# Patient Record
Sex: Female | Born: 1945 | Race: Black or African American | Hispanic: No | Smoking: Former smoker
Health system: Southern US, Community
[De-identification: ages and names within clinical notes are randomized; demographics above are authoritative.]

## PROBLEM LIST (undated history)

## (undated) DIAGNOSIS — E785 Hyperlipidemia, unspecified: Secondary | ICD-10-CM

## (undated) DIAGNOSIS — M722 Plantar fascial fibromatosis: Secondary | ICD-10-CM

## (undated) DIAGNOSIS — M1710 Unilateral primary osteoarthritis, unspecified knee: Secondary | ICD-10-CM

## (undated) DIAGNOSIS — E8881 Metabolic syndrome: Secondary | ICD-10-CM

## (undated) DIAGNOSIS — H9190 Unspecified hearing loss, unspecified ear: Secondary | ICD-10-CM

## (undated) DIAGNOSIS — N189 Chronic kidney disease, unspecified: Secondary | ICD-10-CM

## (undated) DIAGNOSIS — I10 Essential (primary) hypertension: Secondary | ICD-10-CM

## (undated) DIAGNOSIS — N289 Disorder of kidney and ureter, unspecified: Secondary | ICD-10-CM

## (undated) HISTORY — DX: Unilateral primary osteoarthritis, unspecified knee: M17.10

## (undated) HISTORY — PX: ABDOMINAL AORTIC ANEURYSM REPAIR: SUR1152

## (undated) HISTORY — PX: THYROIDECTOMY: SHX17

## (undated) HISTORY — DX: Unspecified hearing loss, unspecified ear: H91.90

## (undated) HISTORY — PX: ABDOMINAL HYSTERECTOMY: SHX81

## (undated) HISTORY — DX: Chronic kidney disease, unspecified: N18.9

## (undated) HISTORY — DX: Essential (primary) hypertension: I10

## (undated) HISTORY — PX: INNER EAR SURGERY: SHX679

## (undated) HISTORY — DX: Metabolic syndrome: E88.81

## (undated) HISTORY — DX: Plantar fascial fibromatosis: M72.2

## (undated) HISTORY — PX: HERNIA REPAIR: SHX51

## (undated) HISTORY — DX: Metabolic syndrome: E88.810

## (undated) HISTORY — PX: APPENDECTOMY: SHX54

## (undated) HISTORY — DX: Hyperlipidemia, unspecified: E78.5

---

## 1998-07-10 ENCOUNTER — Other Ambulatory Visit: Admission: RE | Admit: 1998-07-10 | Discharge: 1998-07-10 | Payer: Self-pay | Admitting: Otolaryngology

## 2002-01-18 ENCOUNTER — Encounter: Admission: RE | Admit: 2002-01-18 | Discharge: 2002-01-18 | Payer: Self-pay | Admitting: Otolaryngology

## 2002-01-18 ENCOUNTER — Encounter: Payer: Self-pay | Admitting: Otolaryngology

## 2004-08-20 ENCOUNTER — Ambulatory Visit: Payer: Self-pay | Admitting: Vascular Surgery

## 2004-08-20 IMAGING — CT CT CHEST-ABD-PELV W/ CM
1 of 3 series · 12 of 31 positions shown, 18 images · IV contrast (APPLIED)
Comparison: none

REASON FOR EXAM: Abdominal aneurysm: 3 mm cuts around the renal
COMMENTS:

[Series 5: inspace · axial · 0.67mm/px · z∈[-545,-32]mm · 12 of 209 slices shown, 18 images]
[im 19/209  mediastinal]
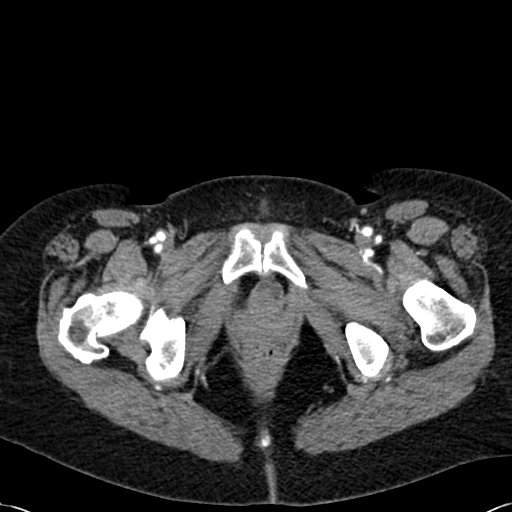
[im 19/209  bone]
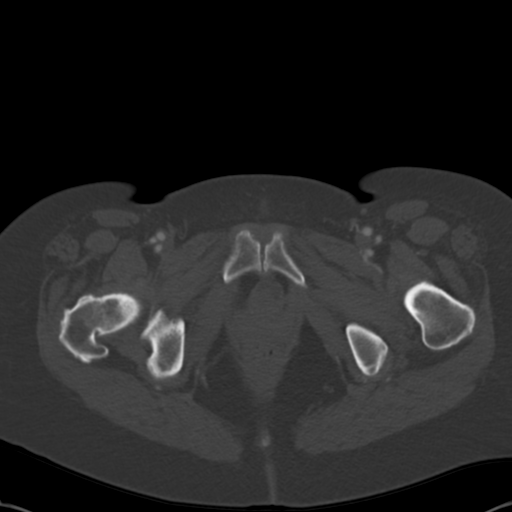
[im 38/209  mediastinal]
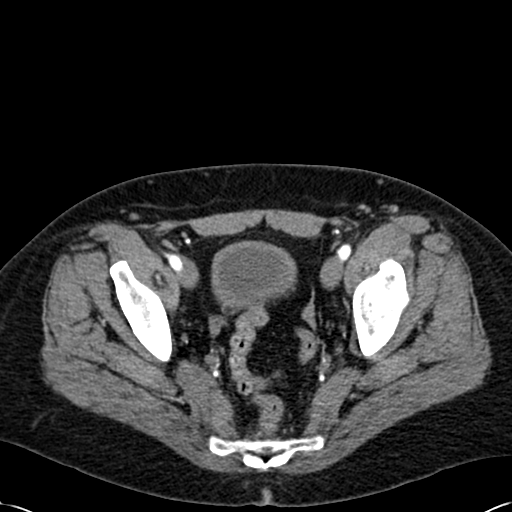
[im 57/209  mediastinal]
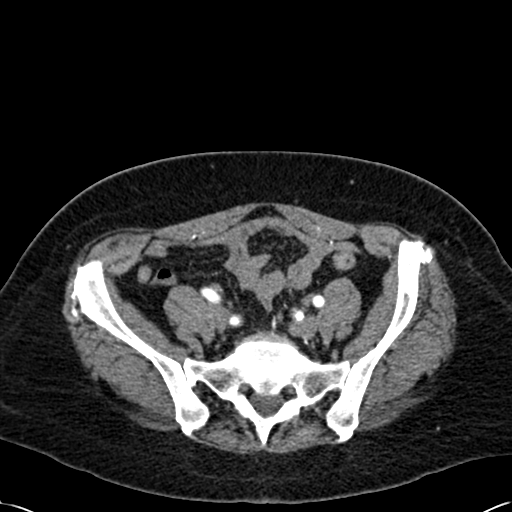
[im 76/209  mediastinal]
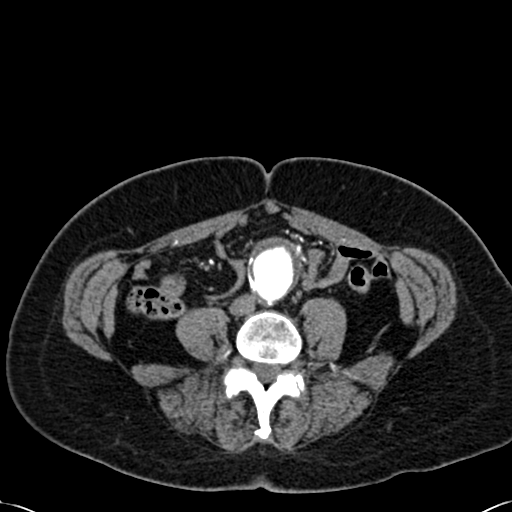
[im 95/209  mediastinal]
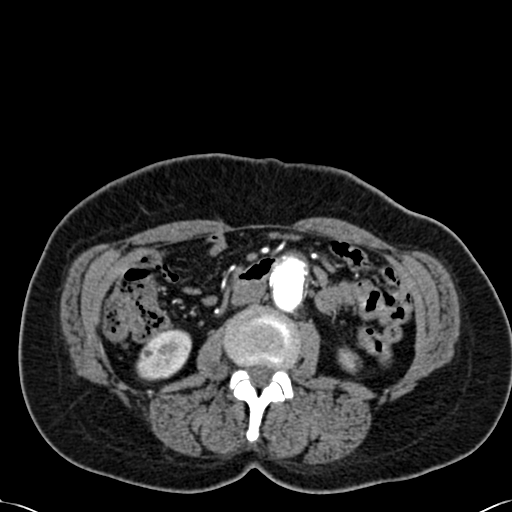
[im 102/209  mediastinal]
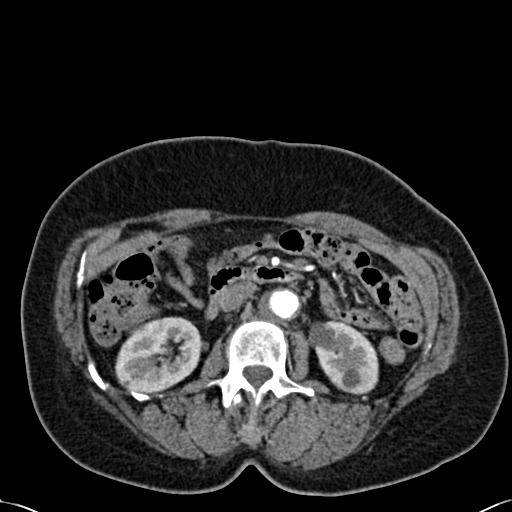
[im 105/209  mediastinal]
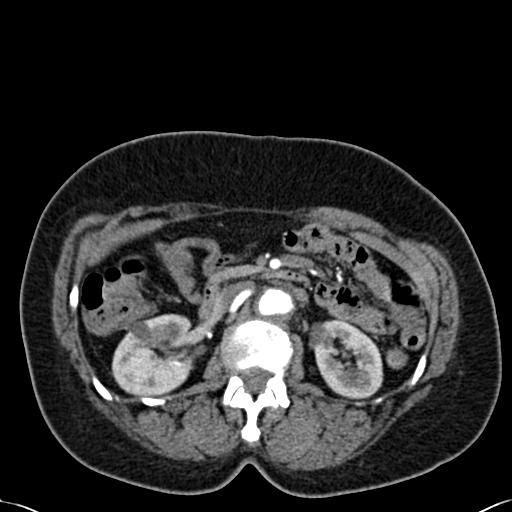
[im 114/209  mediastinal]
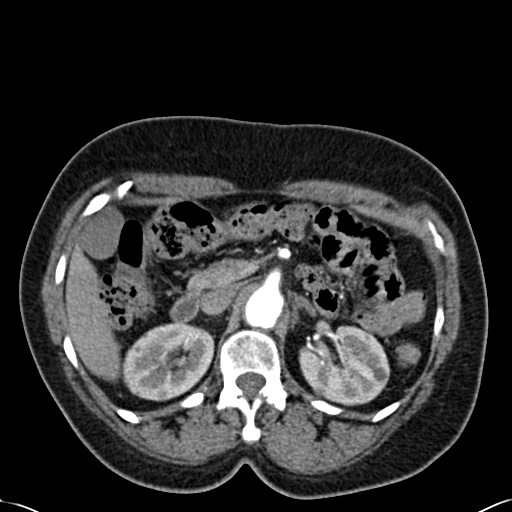
[im 133/209  mediastinal]
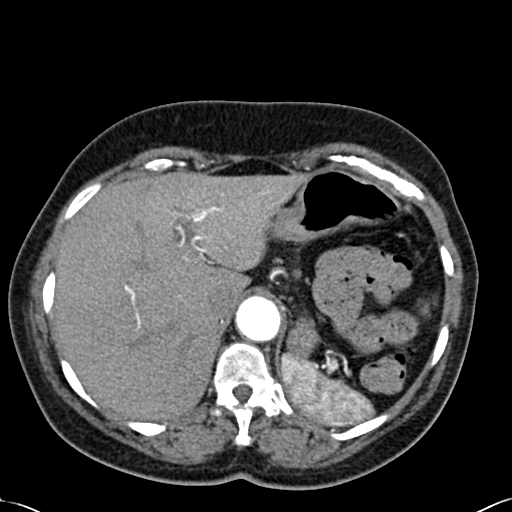
[im 133/209  lung]
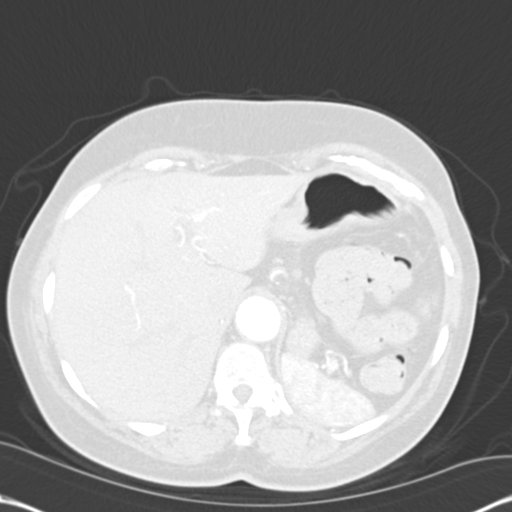
[im 133/209  bone]
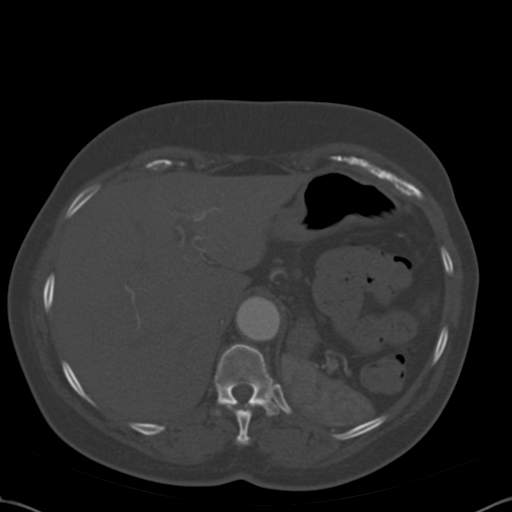
[im 152/209  mediastinal]
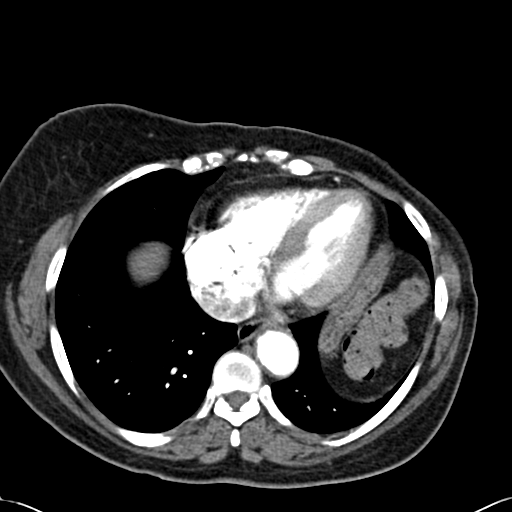
[im 152/209  lung]
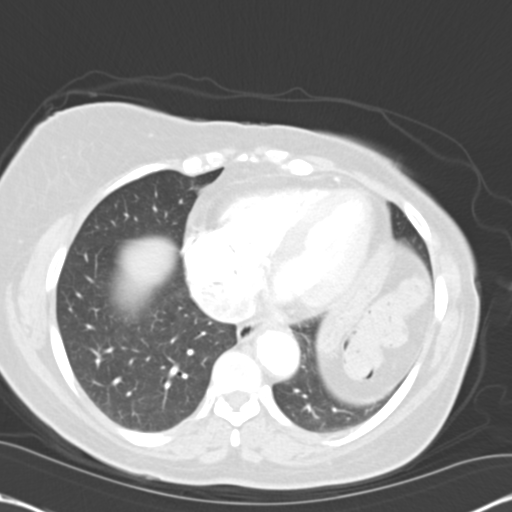
[im 171/209  mediastinal]
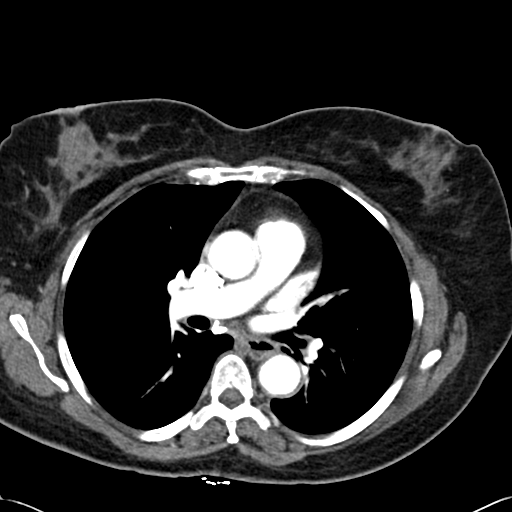
[im 171/209  lung]
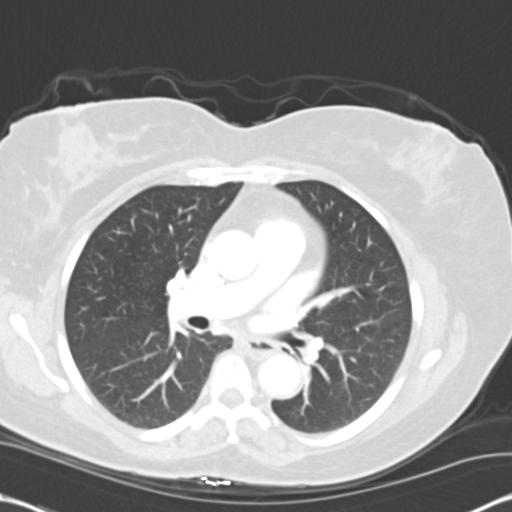
[im 190/209  mediastinal]
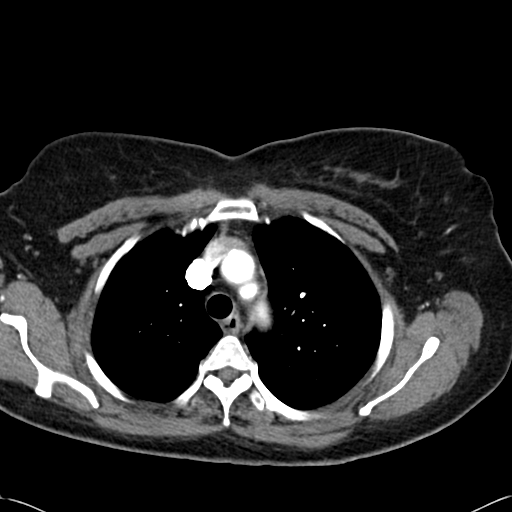
[im 190/209  lung]
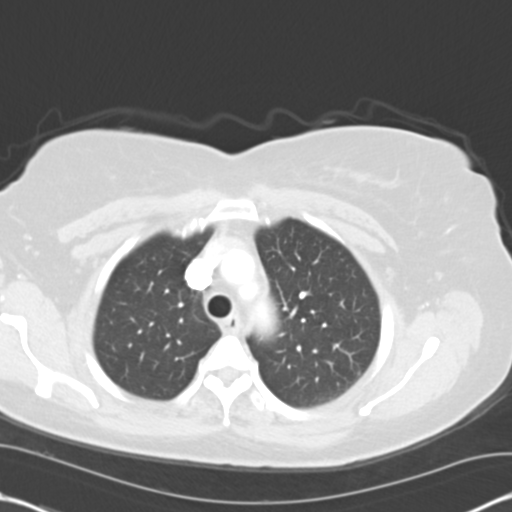

[12 of 31 positions shown; findings below may reference images not displayed]

PROCEDURE:     CT  - CT CHEST ABDOMEN AND PELVIS W  - [DATE]  [DATE]

RESULT:     Spiral 3-mm sections were obtained from the thoracic inlet
through the lung bases status post intravenous administration of 100 cc of
[NL].

CT SCAN OF THE CHEST: There does not appear to be evidence of mediastinal or
hilar masses or adenopathy.  Evaluation of the lung parenchyma demonstrates
an ill-defined area of mild to moderate spiculated linear increased density
within the superior segment LEFT lower lobe.  No further infiltrates,
effusions, or edema is appreciated.

CT SCAN OF THE ABDOMEN AND PELVIS:  Spiral 3-mm sections were obtained from
the lung bases through the pubic symphysis status post intravenous
administration of 100 cc of [NL].

The liver, spleen, adrenals, and pancreas are unremarkable.  Bilateral
low-attenuating foci are once again demonstrated within the RIGHT and LEFT
kidneys unchanged and again likely representing the sequela of cysts.  The
previously described abdominal aortic aneurysm is unchanged.  This was
compared to the previous study dated [DATE]. There does not appear to be
perianeurysmal fluid to suggest sequela of leakage. No evidence of abdominal
or pelvic free fluid, drainable loculated fluid collections, masses, or
adenopathy is appreciated.
IMPRESSION: Atelectasis versus infiltrate versus scarring within the base of the LEFT
lower lobe. There does not appear to be evidence of a thoracic aortic
aneurysm or dissection.

Stable abdominal aortic aneurysm as described above.

## 2004-08-26 IMAGING — CR DG CHEST 2V
1 series · 2 of 2 positions shown · non-contrast
Comparison: none

REASON FOR EXAM: Vomiting, cough
COMMENTS:

PROCEDURE:     DXR - DXR CHEST PA (OR AP) AND LATERAL  - [DATE]  [DATE]
RESULT:     PA and lateral views of the chest show the lungs to be clear and
well expanded without evidence of infiltrates, effusions or mass.

[Series 1112: postero_anterior · 0.11mm/px · 2 of 2 slices shown]
[im 1/2]
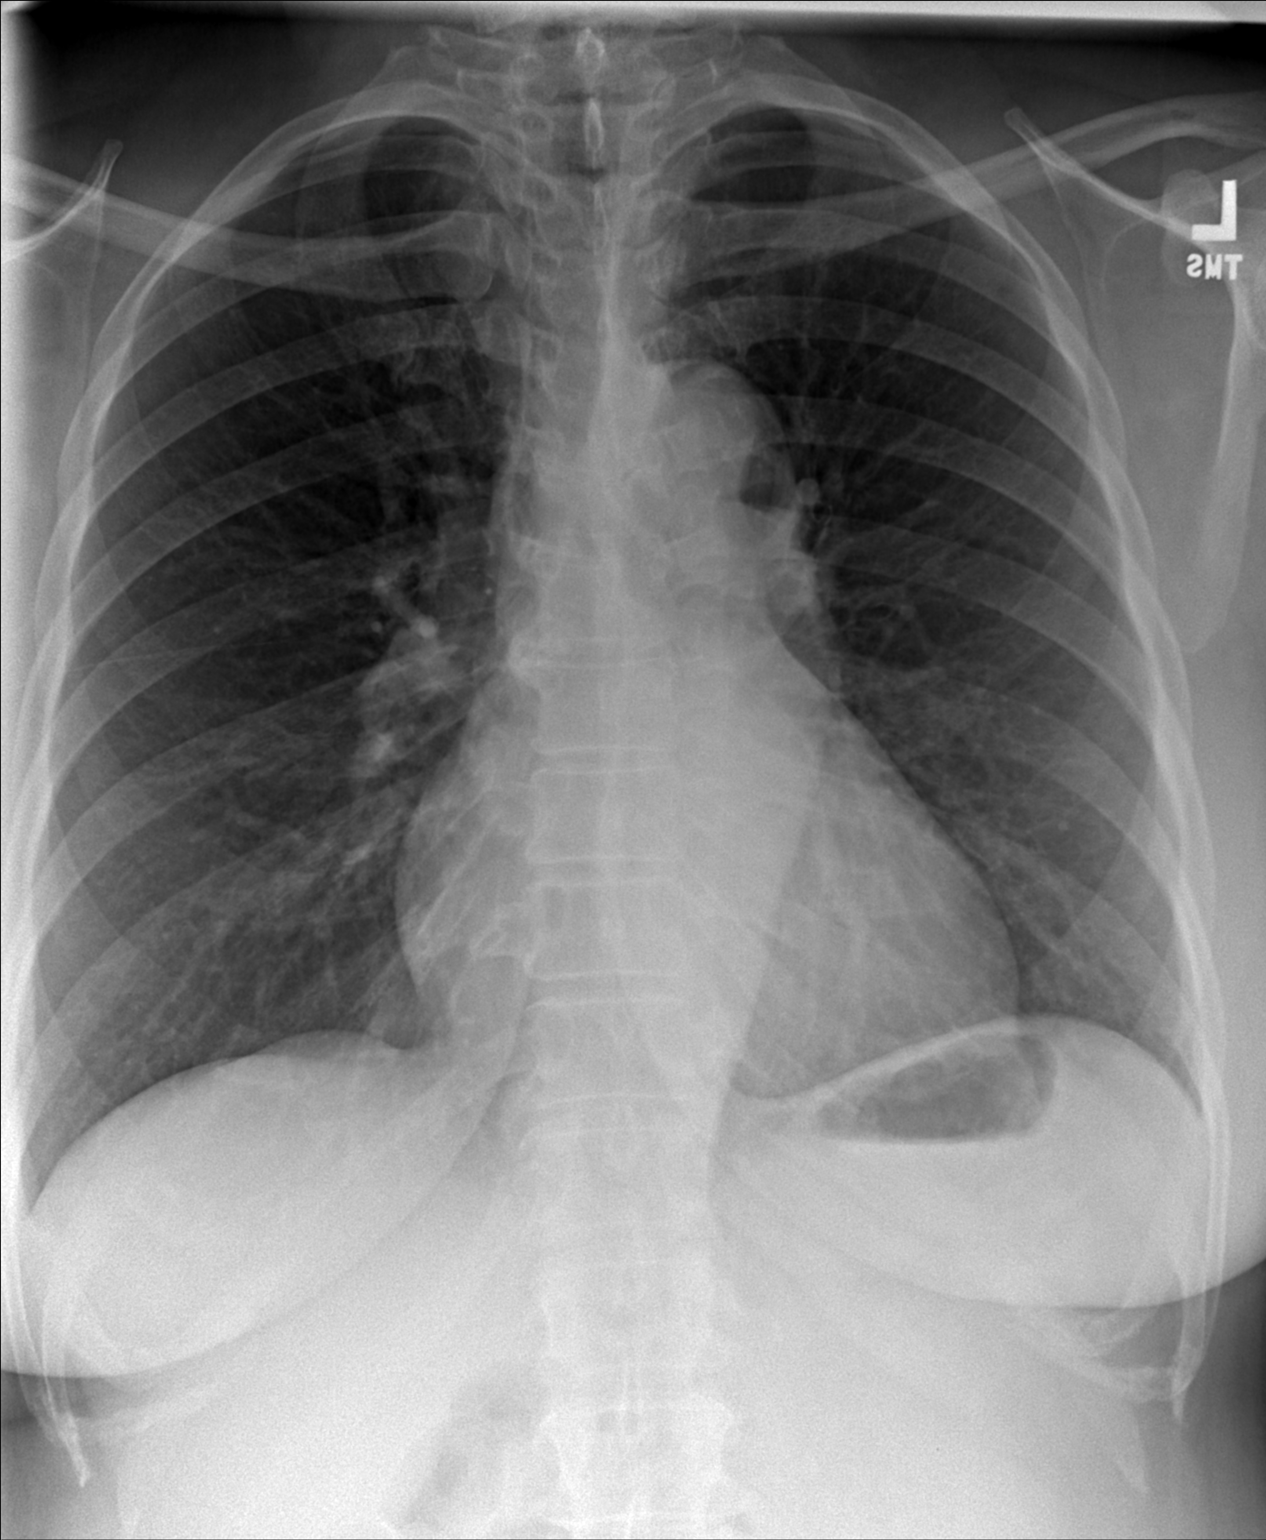
[im 2/2]
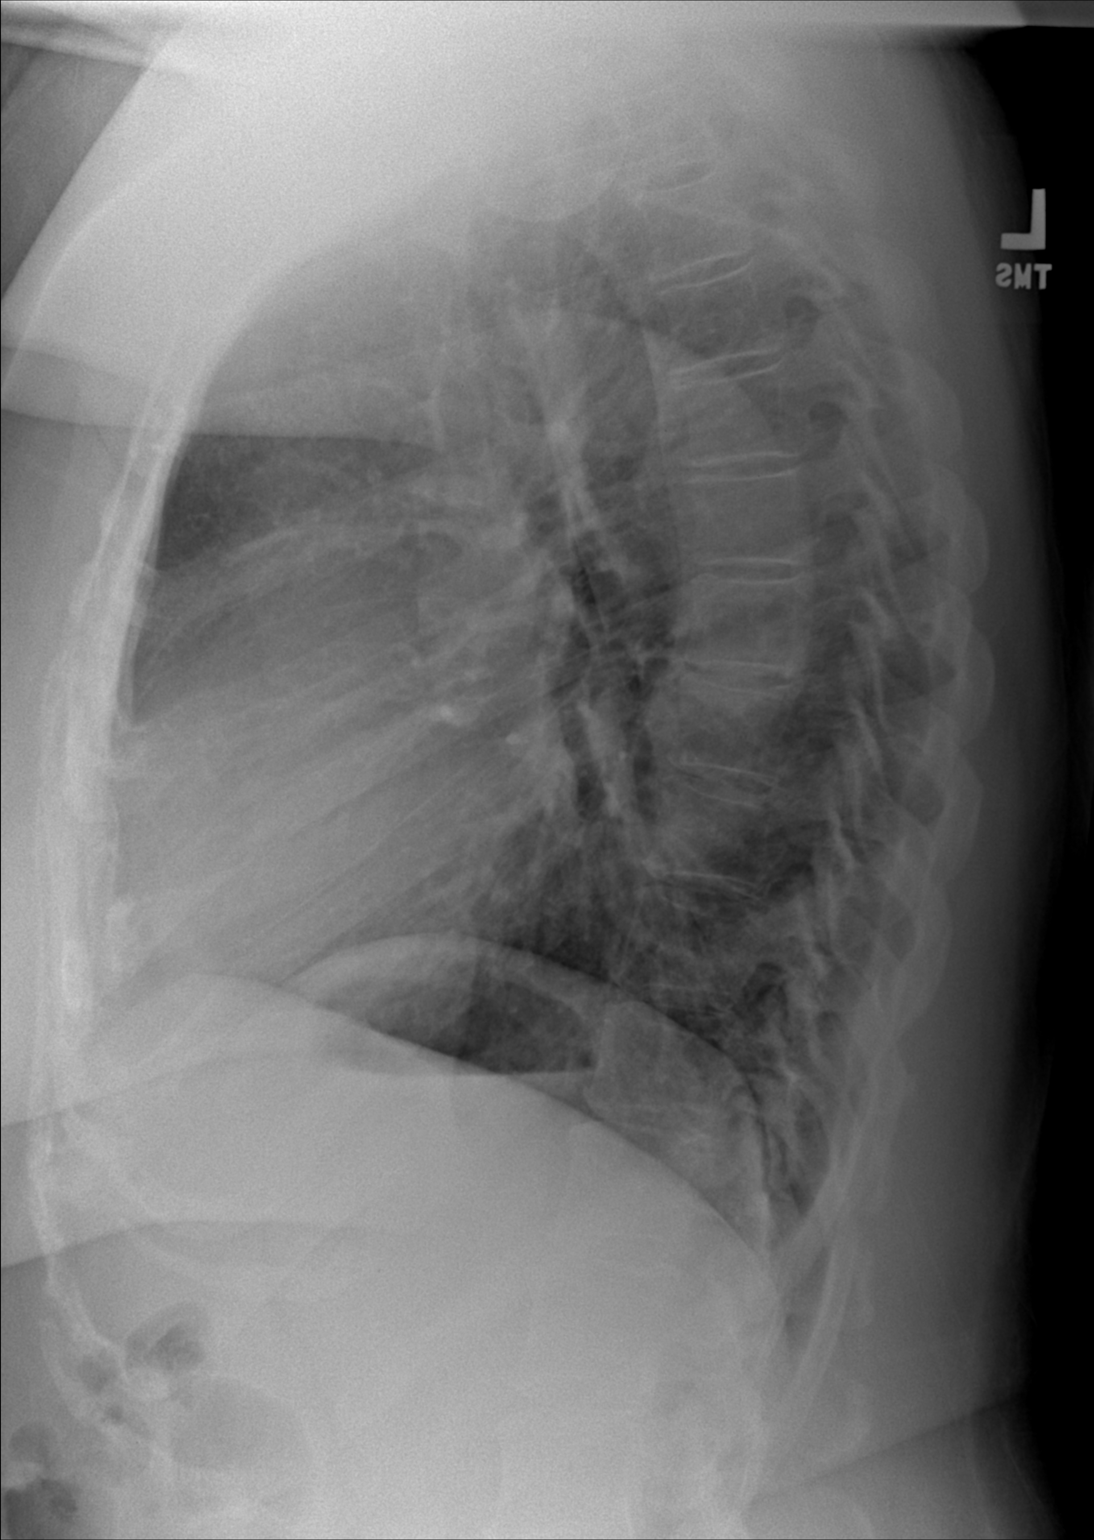

[2 of 2 positions shown; findings below may reference images not displayed]

IMPRESSION: No acute pulmonary disease. No change since [DATE].

## 2005-02-24 ENCOUNTER — Encounter: Admission: RE | Admit: 2005-02-24 | Discharge: 2005-02-24 | Payer: Self-pay | Admitting: Vascular Surgery

## 2005-02-24 IMAGING — CT CT ANGIO ABDOMEN
1 of 8 series · 13 of 34 positions shown, 18 images · IV contrast ([ID] omniopaque)
Comparison: none

CLINICAL DATA: Pre-stent for AAA.
TECHNIQUE: Multidetector CT imaging of the abdomen was performed before and during bolus injection of intravenous contrast.  Multiplanar CT angiographic image reconstructions were generated to evaluate the vascular anatomy.
 Contrast:  150 cc of Omnipaque 300.  
 The lung bases are clear.  The liver enhances normally with no focal abnormality, and no ductal dilatation is seen.  No calcified gallstones are noted.  The pancreas is normal in size with normal peripancreatic fat planes.  The adrenal glands and spleen appear normal.  The kidneys enhance normally and contain multiple cysts of varying sizes.  On delayed images, the pelvocaliceal systems appear normal.  No adenopathy is seen.  Infrarenal AAA is noted and the following pre stent graft measurements were obtained:
 A. 30 mm  Length of infrarenal neck below lowest renal artery to top of aneurysmal aorta
   Number of Renal Arteries  (R) 1 (L) 1
 B. 19 mm  Diameter of infrarenal neck
 C. 107 mm  Total length of Aneurysm
   Does aneurysm end at Aortic Bifurcation?  Yes
   If not, distance from aneurysm to bifurcation_
 D. 42 mm  Greatest aneurysm diameter
 E. 17 mm  (R) Greatest Common Iliac Artery diameters (proximally)
   13 mm  (L)
 F. 15 mm  (R) Diameter of Common Iliac Arteries just above iliac bifurcation
   12 mm  (L)
 G. 20 mm  (R) Approximate length of Common Iliac Arteries
   21 mm  (L)
 H.  Comments:  Both common iliac arteries are somewhat shortened in length measuring 20 and 21 mm prior to the iliac bifurcation.

[Series 5: recon 2: post contrast · axial · 0.70mm/px · z∈[-356,+15]mm · 13 of 335 slices shown, 18 images]
[im 19/335  soft-tissue]
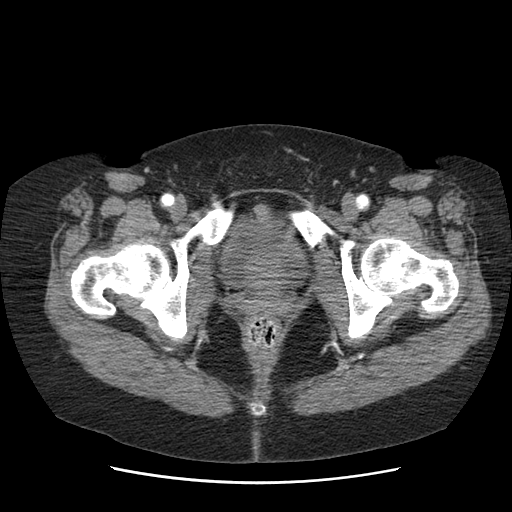
[im 19/335  bone]
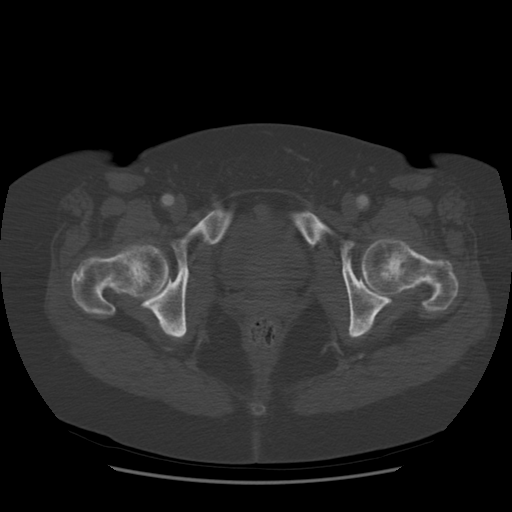
[im 56/335  soft-tissue]
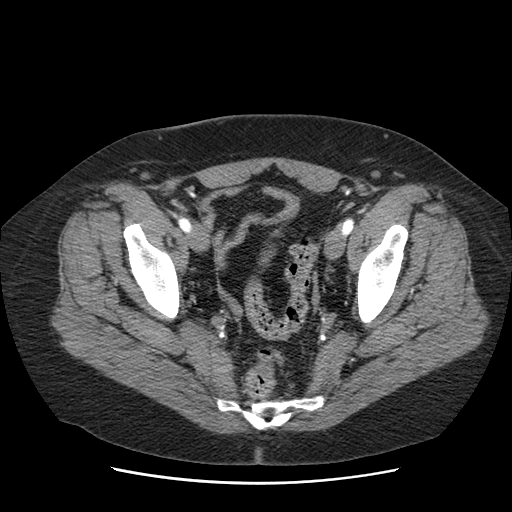
[im 75/335  soft-tissue]
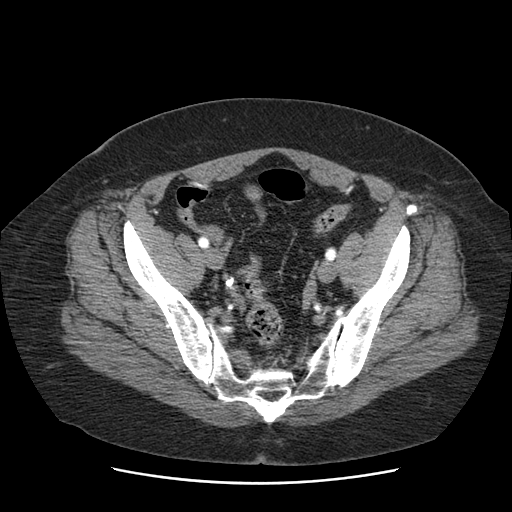
[im 93/335  soft-tissue]
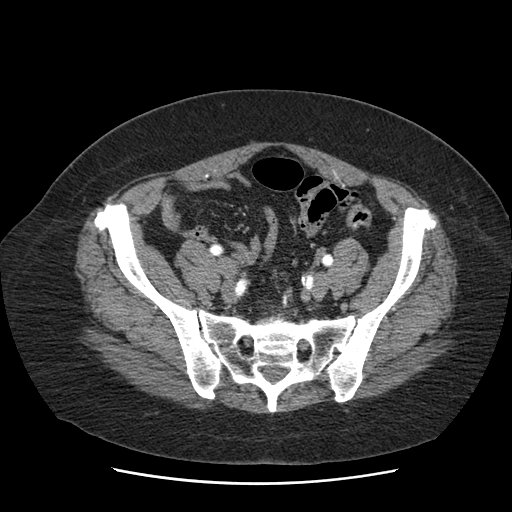
[im 130/335  soft-tissue]
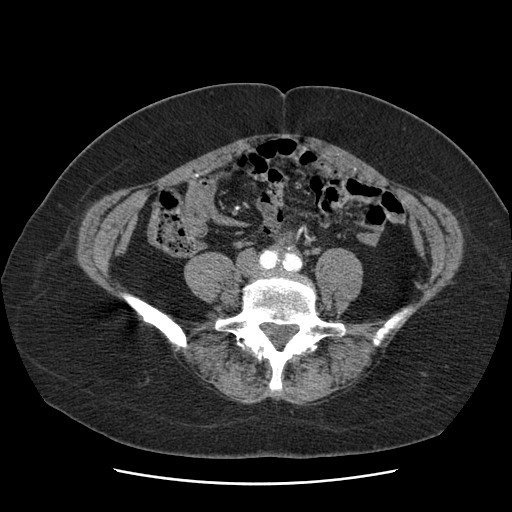
[im 149/335  soft-tissue]
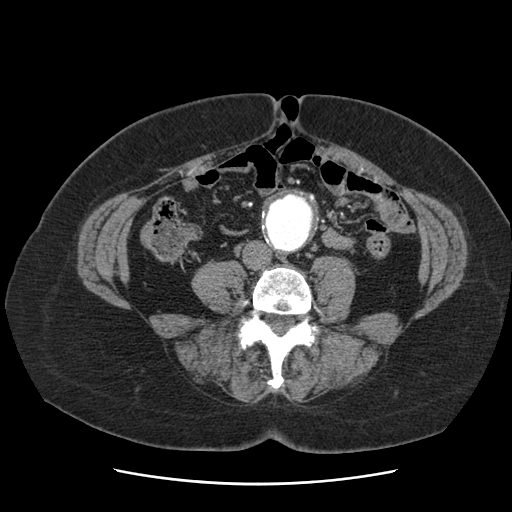
[im 186/335  soft-tissue]
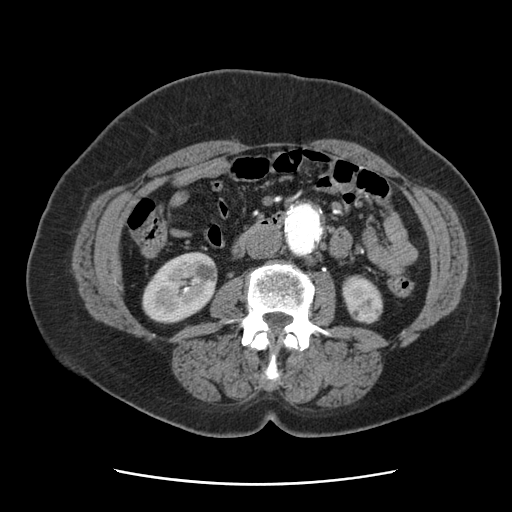
[im 205/335  soft-tissue]
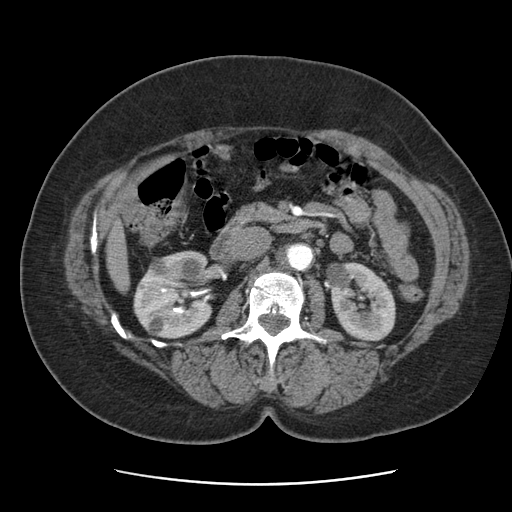
[im 242/335  soft-tissue]
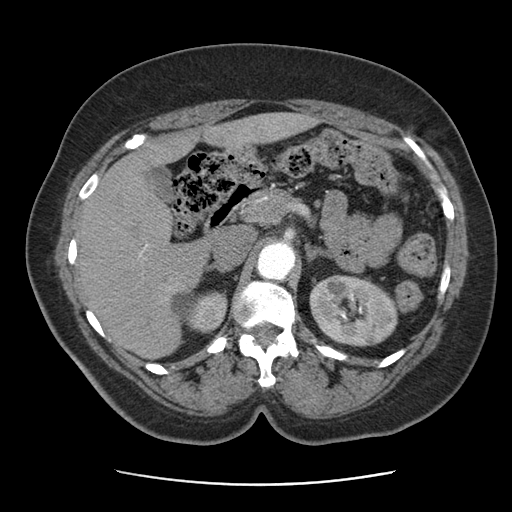
[im 242/335  bone]
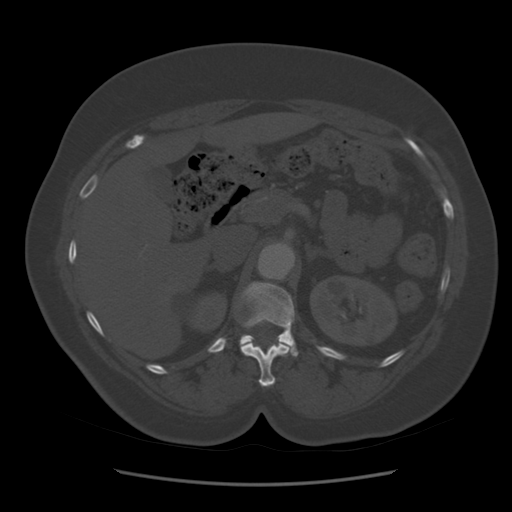
[im 260/335  soft-tissue]
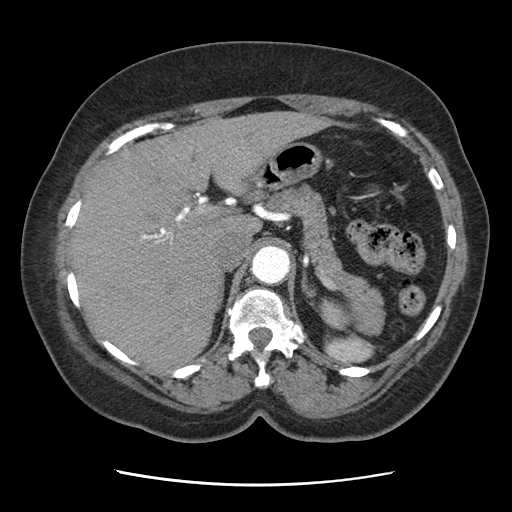
[im 260/335  lung]
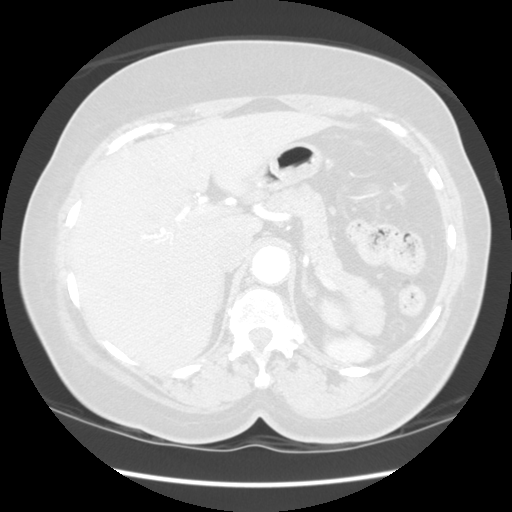
[im 279/335  soft-tissue]
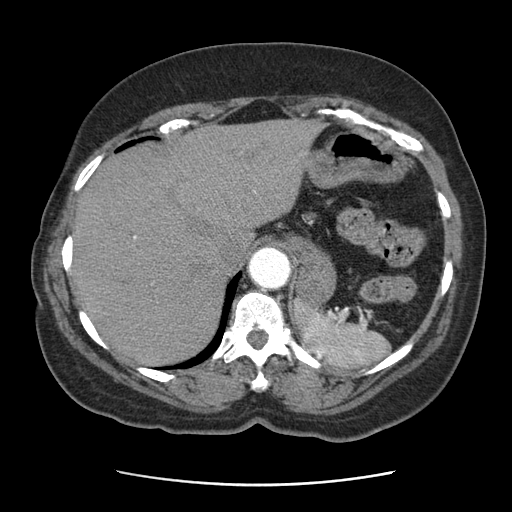
[im 279/335  lung]
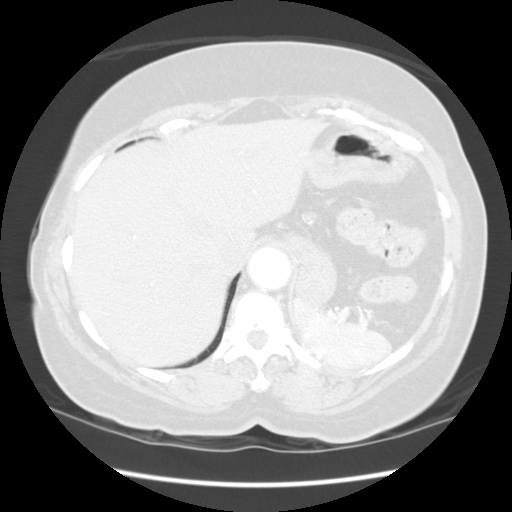
[im 297/335  lung]
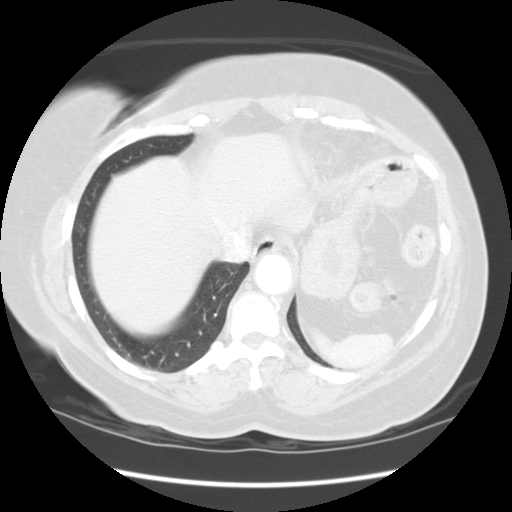
[im 316/335  soft-tissue]
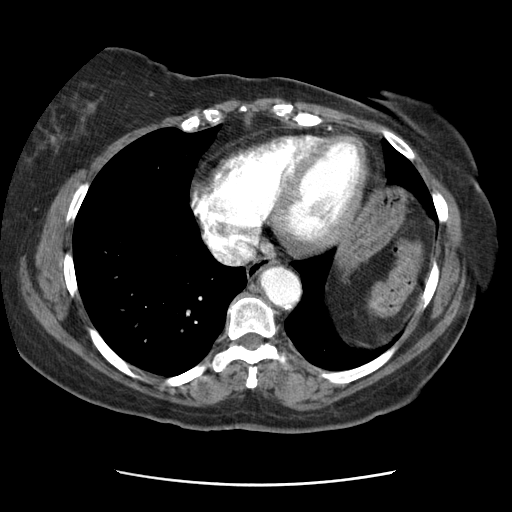
[im 316/335  lung]
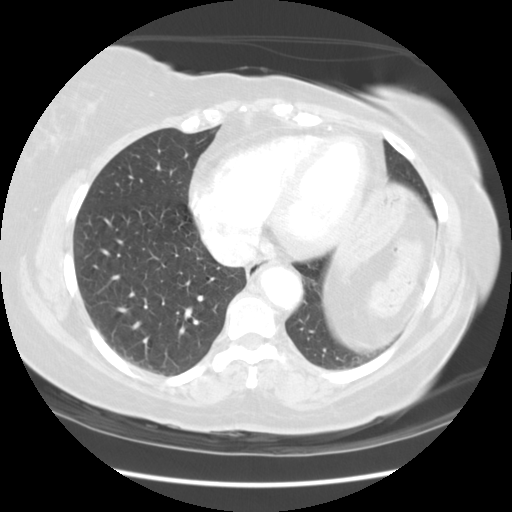

[13 of 34 positions shown; findings below may reference images not displayed]

IMPRESSION: 1.  Infrarenal AAA of 42 mm in greatest diameter with additional pre stent graft measurements given above.
 CT ANGIO PELVIS:
 Scans were continued through the pelvis and correlation with axial, coronal, and sagittal images again performed.  The urinary bladder is unremarkable with probable contrast creating slight increased attenuation centrally within the lumen of the bladder.  Pre stent graft measurements are given in above CT angio abdomen dictation.  There are degenerative changes involving the facet joints of the lower lumbar spine.
IMPRESSION: 1.  Both common iliac arteries are somewhat shortened, as noted in above CT angio abdomen pre stent graft measurements. 
 2.  Degenerative change in facet joints of the lower lumbar spine.

## 2005-03-26 ENCOUNTER — Emergency Department: Payer: Self-pay | Admitting: Emergency Medicine

## 2005-05-09 ENCOUNTER — Emergency Department: Payer: Self-pay | Admitting: Emergency Medicine

## 2005-05-10 ENCOUNTER — Ambulatory Visit: Payer: Self-pay | Admitting: Emergency Medicine

## 2005-05-10 ENCOUNTER — Other Ambulatory Visit: Payer: Self-pay

## 2005-05-10 IMAGING — US ABDOMEN ULTRASOUND
1 series · 17 of 25 positions shown · non-contrast
Comparison: none

REASON FOR EXAM: Abdominal pain
COMMENTS:

[Series 1: abdomen ultrasound · 17 of 82 slices shown]
[im 1/82]
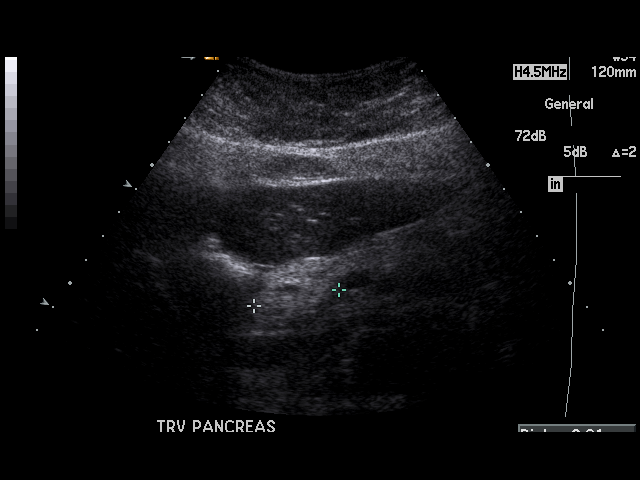
[im 7/82]
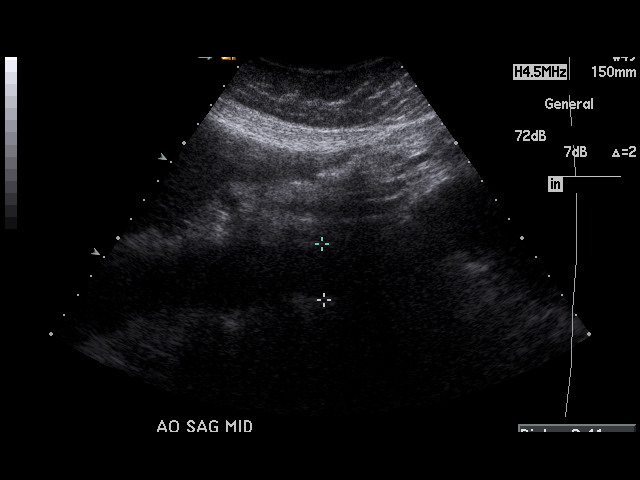
[im 11/82]
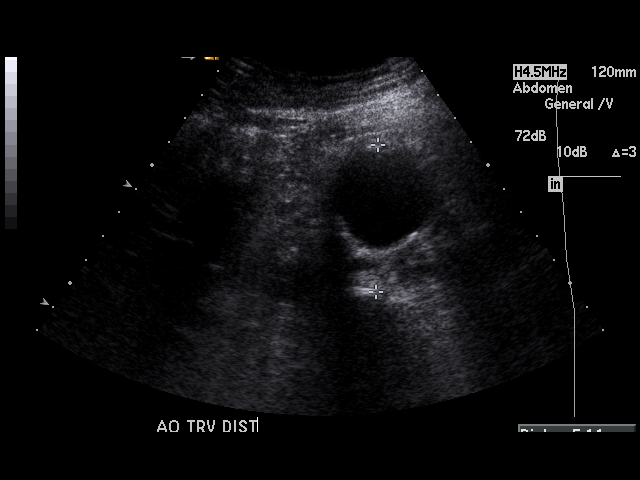
[im 17/82]
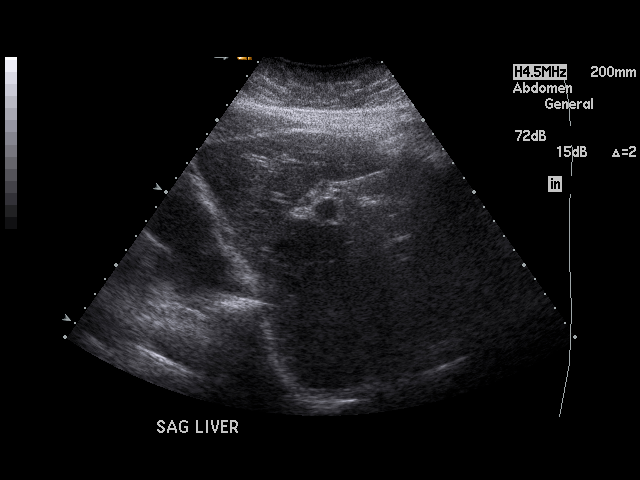
[im 21/82]
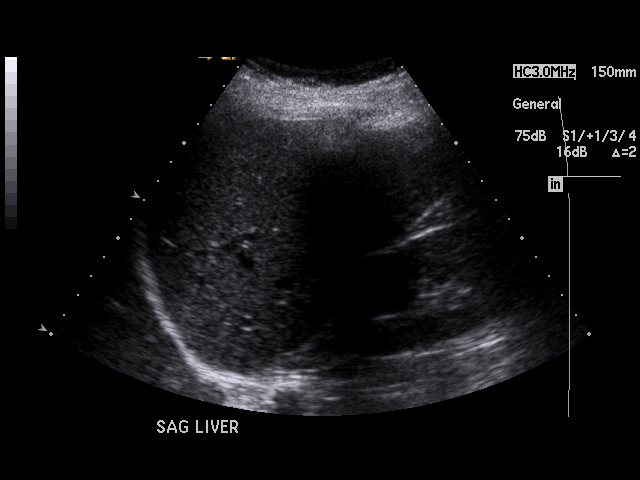
[im 28/82]
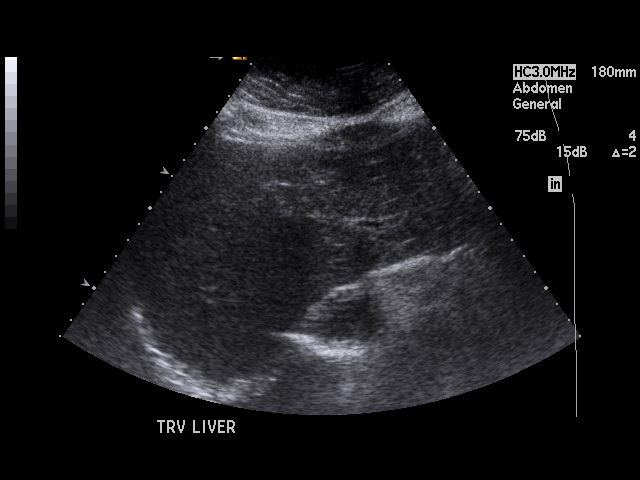
[im 31/82]
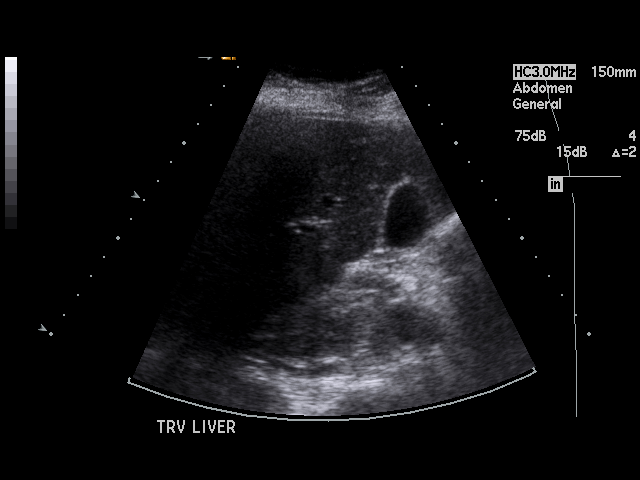
[im 38/82]
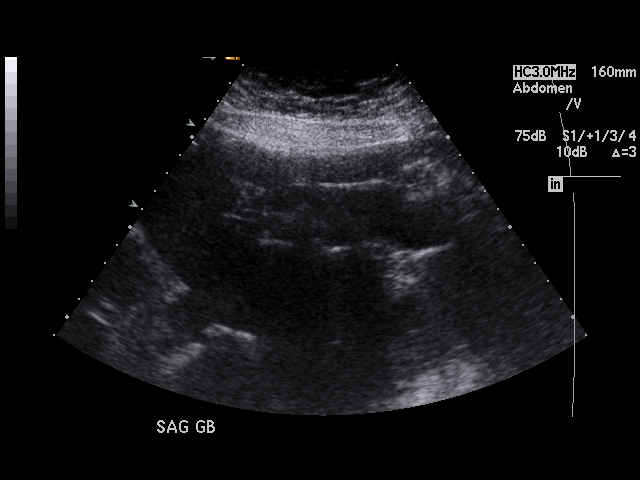
[im 41/82]
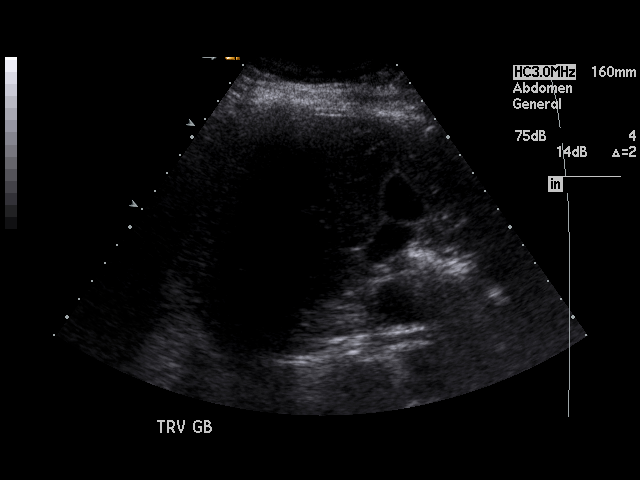
[im 44/82]
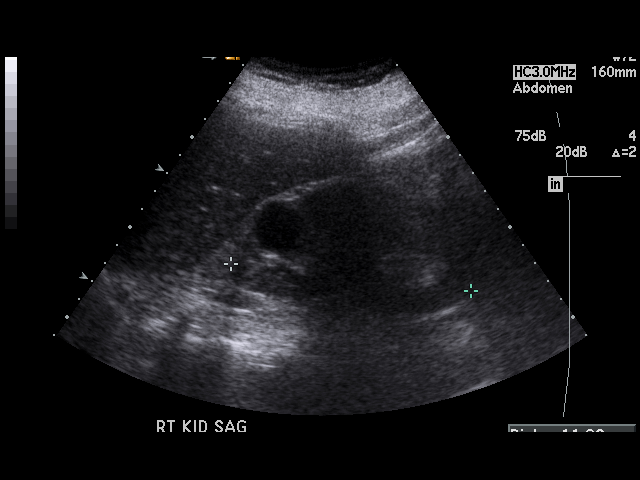
[im 51/82]
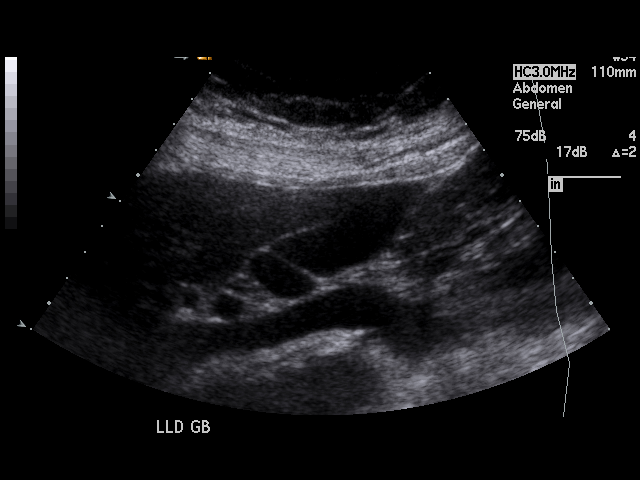
[im 55/82]
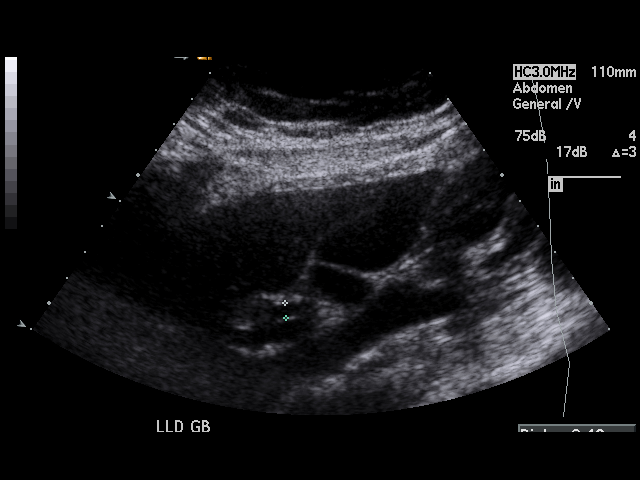
[im 61/82]
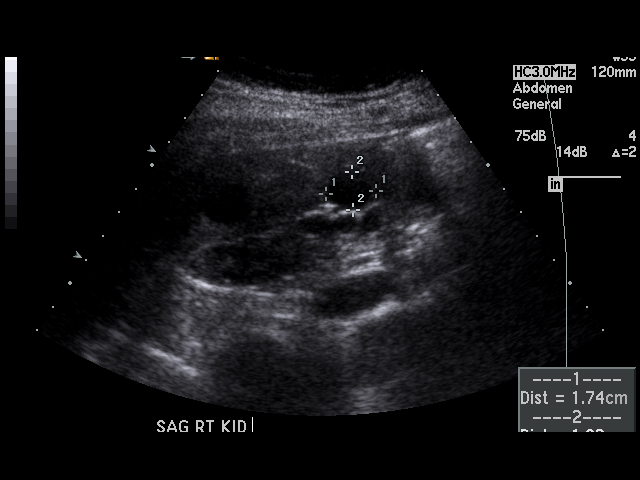
[im 65/82]
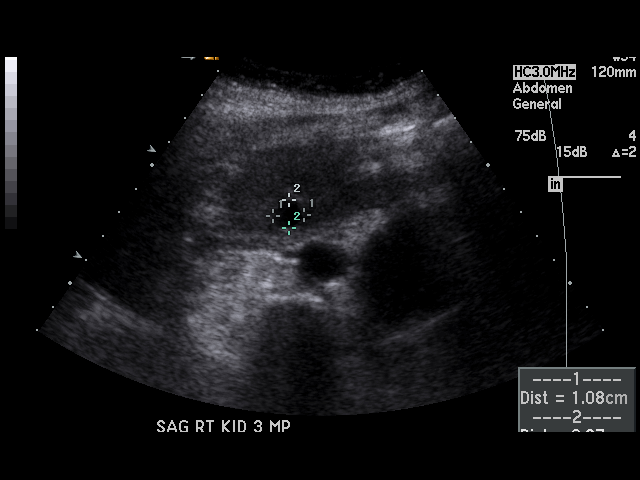
[im 71/82]
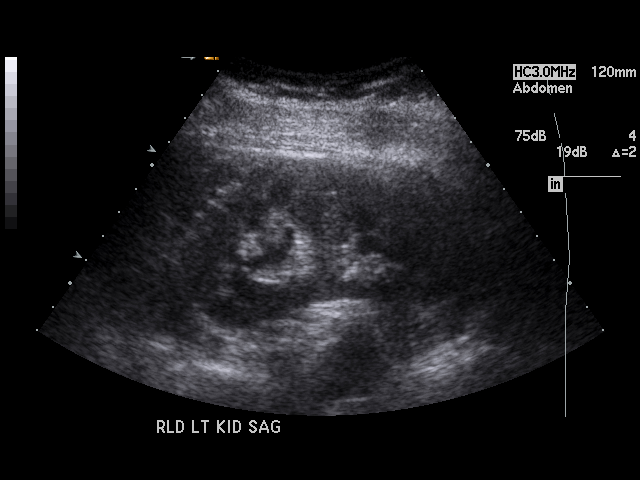
[im 75/82]
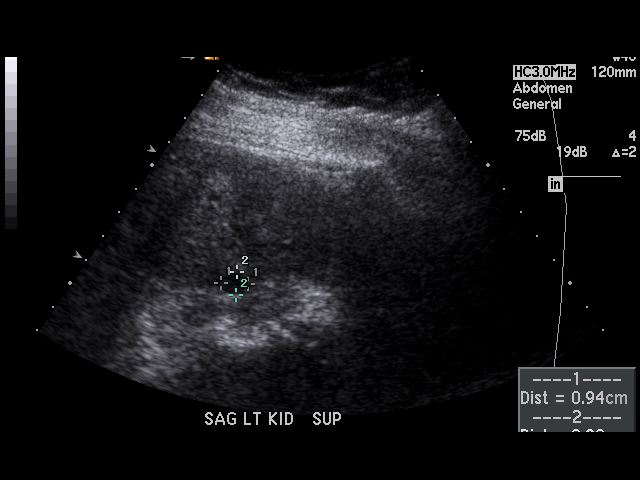
[im 82/82]
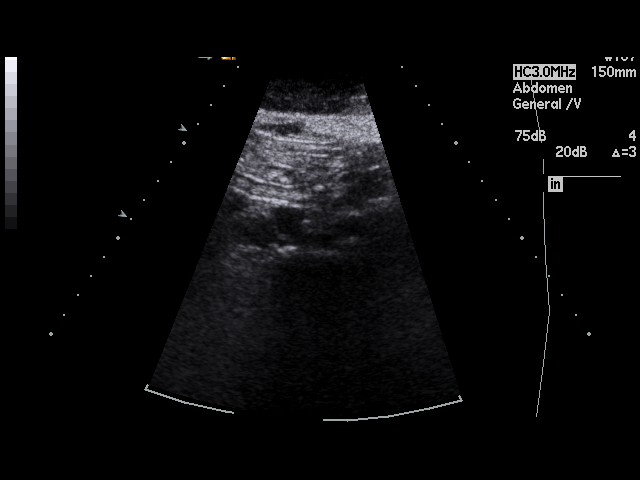

[17 of 25 positions shown; findings below may reference images not displayed]

PROCEDURE:     US  - US ABDOMEN GENERAL SURVEY  - [DATE] [DATE]

RESULT:     The patient has a known infrarenal abdominal aortic aneurysm.
On the study performed in [DATE], the maximum measured dimension
was seen t be approximately 3.8 cm in diameter.  This appeared to be
relatively stable by [DATE].  However, there is noted to be mural
thrombus that was not included in the measurements on this study.  This
mural thrombus measures as much as another centimeter to a centimeter and a
half in thickness.  On today's study the lumen does not appear to have
changed significantly in AP dimension.  This is approximately 3.2 cm.  The
transverse dimension measures between 3.8 and 4 cm.

The liver, pancreas, spleen, abdominal aorta, and gallbladder are normal in
appearance.  There are no gallstones seen and there is no sonographic
Murphy's sign.  Common bile duct measures 4.7 mm in diameter.  The kidneys
demonstrate the presence of multiple simple cysts on the RIGHT and a single
cyst on the LEFT in the upper pole.
IMPRESSION: There is an infrarenal abdominal aortic aneurysm that does not appear to
have changed greatly in size in terms of the patent luminal diameter.  Its
overall maximal dimension including an area of mural thrombus is
approximately 5.1 cm.  No finding is seen to suggest leakage.

There are simple-appearing cysts in both kidneys.

I do not see evidence of hepatobiliary disease.

## 2006-09-21 ENCOUNTER — Encounter: Admission: RE | Admit: 2006-09-21 | Discharge: 2006-09-21 | Payer: Self-pay | Admitting: Vascular Surgery

## 2006-09-21 IMAGING — CT CT ANGIO ABDOMEN
2 of 6 series · 16 of 46 positions shown, 18 images · IV contrast ([ID] OMNI 350)
Comparison: Abdominal CTA, [DATE].

CLINICAL DATA: Abdominal aortic aneurysm.  Prestent evaluation.  
CT ANGIOGRAPHY OF THE ABDOMEN:
TECHNIQUE: Multidetector CT imaging of the abdomen was performed before and during bolus injection of intravenous contrast.  Multiplanar CT angiographic image reconstructions were generated to evaluate the vascular anatomy.
Contrast:  100 cc Omnipaque 350
TECHNIQUE: Multidetector CT imaging of the pelvis was performed during bolus injection of intravenous contrast.  Multiplanar CT angiographic image reconstructions were generated to evaluate the vascular anatomy.

[Series 4: angio · axial · 0.70mm/px · z∈[-517,-76]mm · 13 of 806 slices shown, 15 images]
[im 36/806  soft-tissue]
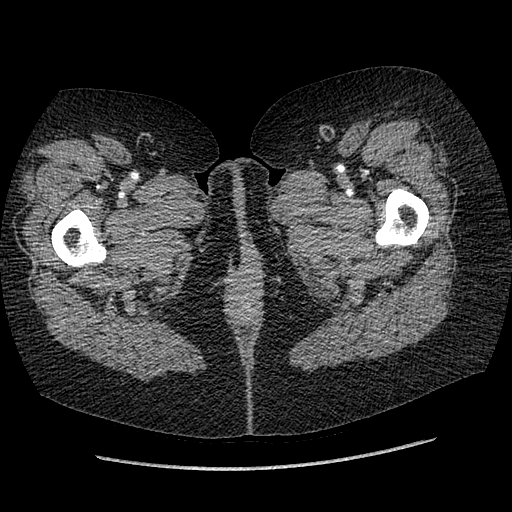
[im 36/806  bone]
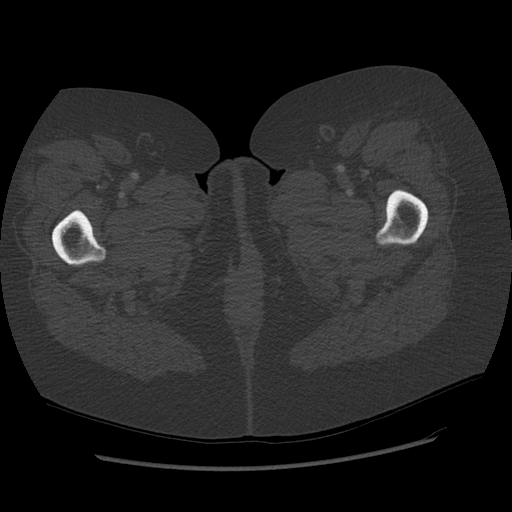
[im 106/806  soft-tissue]
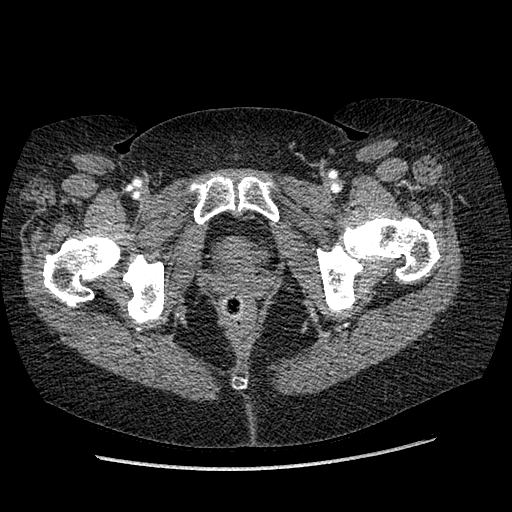
[im 176/806  soft-tissue]
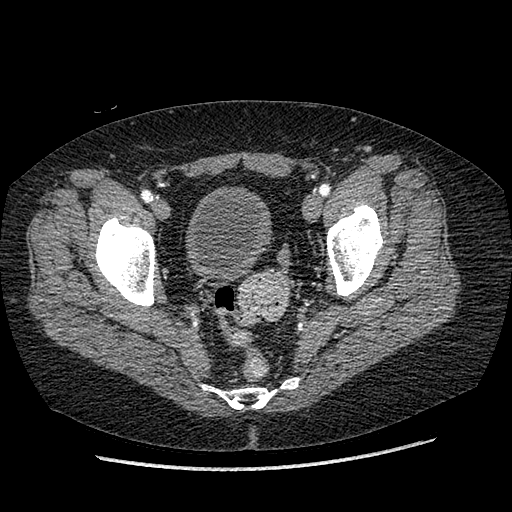
[im 211/806  soft-tissue]
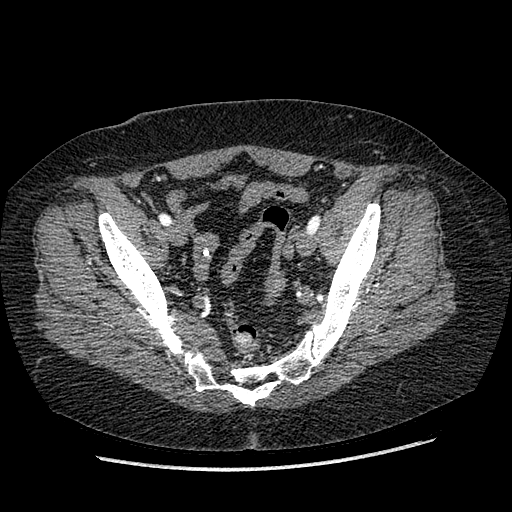
[im 281/806  soft-tissue]
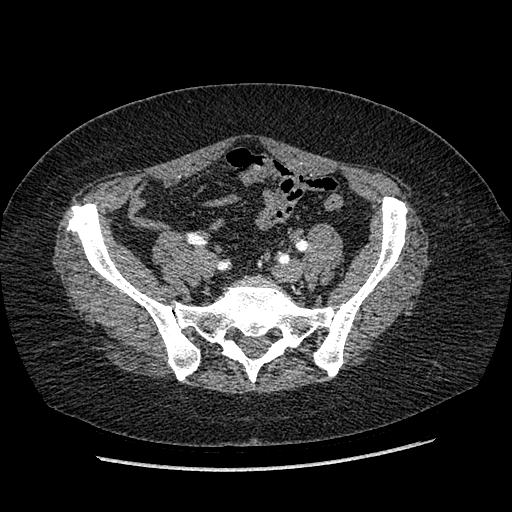
[im 351/806  soft-tissue]
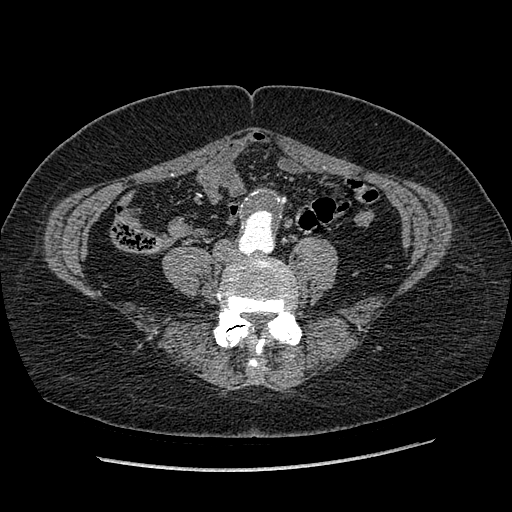
[im 421/806  soft-tissue]
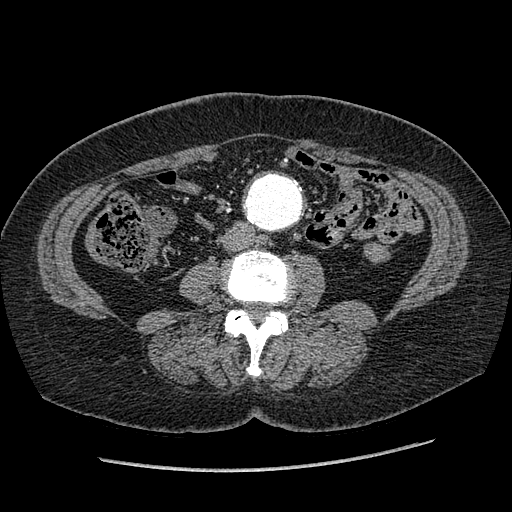
[im 456/806  soft-tissue]
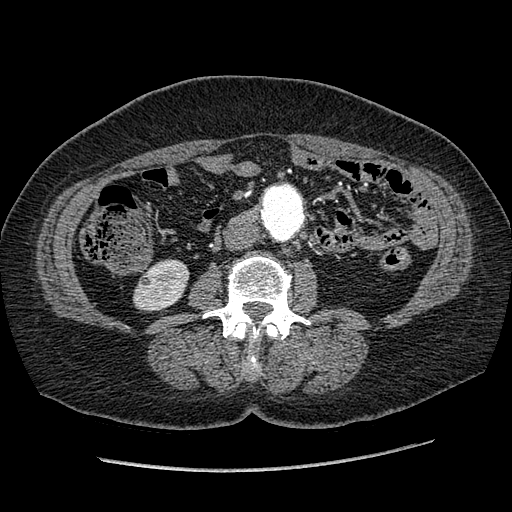
[im 526/806  soft-tissue]
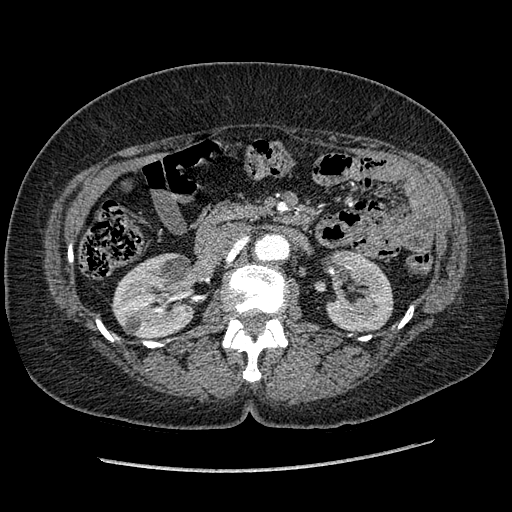
[im 526/806  bone]
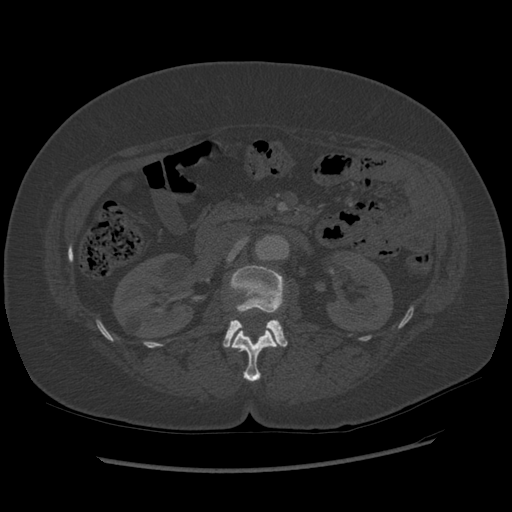
[im 596/806  soft-tissue]
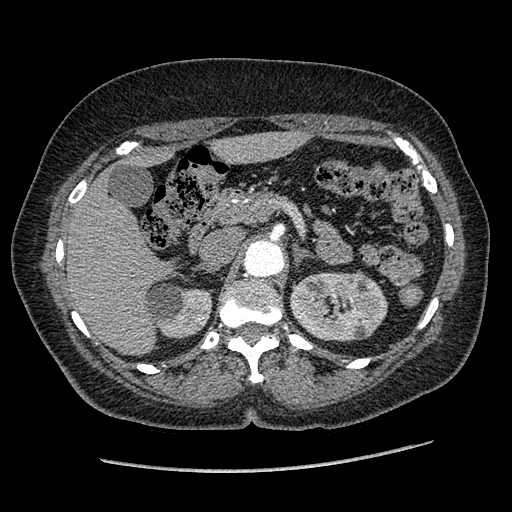
[im 631/806  soft-tissue]
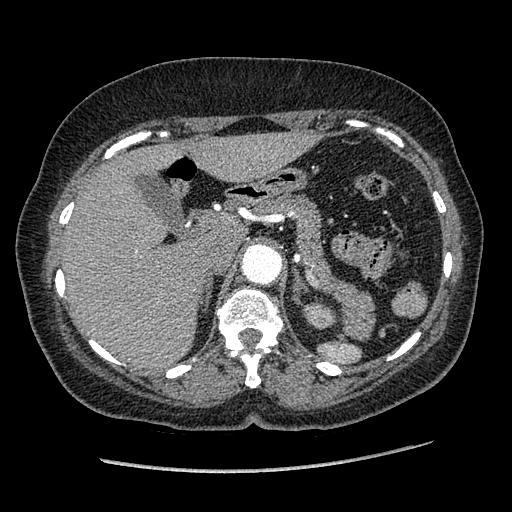
[im 701/806  soft-tissue]
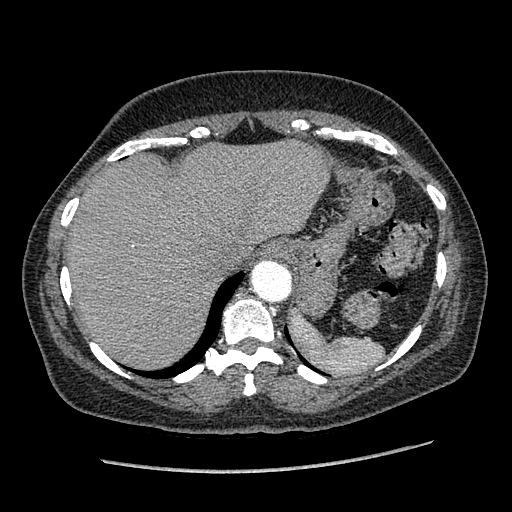
[im 771/806  soft-tissue]
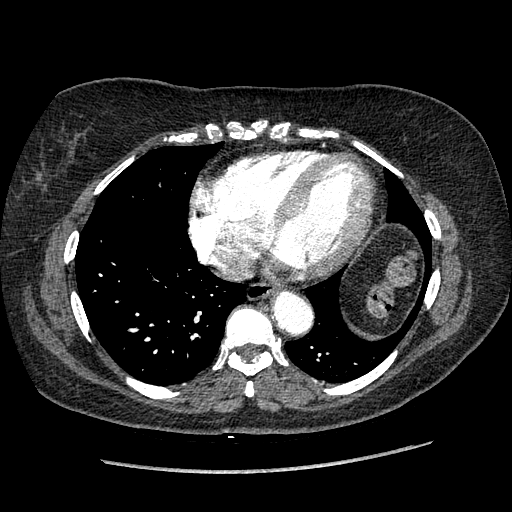

[Series 602: sagittal angio · sagittal · 0.95mm/px · 3 of 145 slices shown]
[im 49/145  soft-tissue]
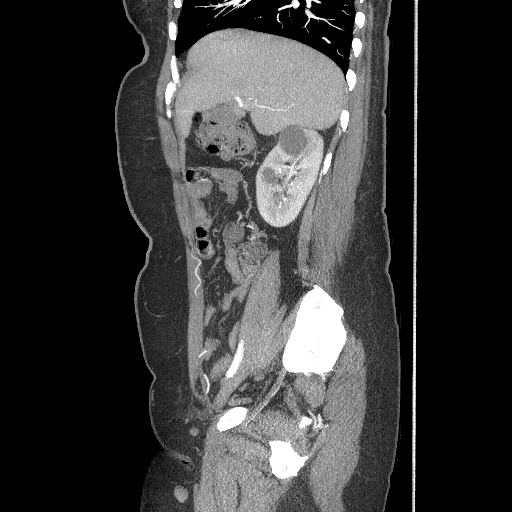
[im 65/145  soft-tissue]
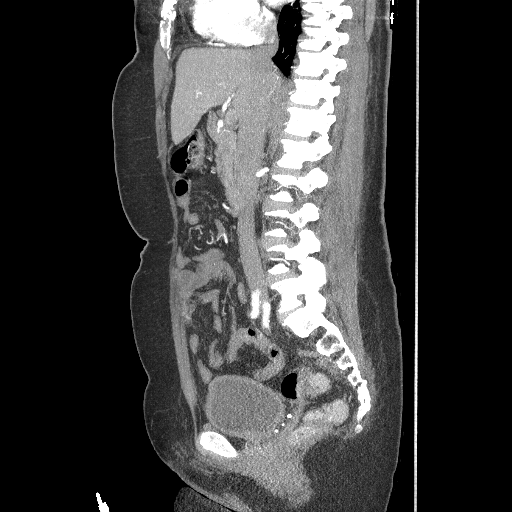
[im 81/145  soft-tissue]
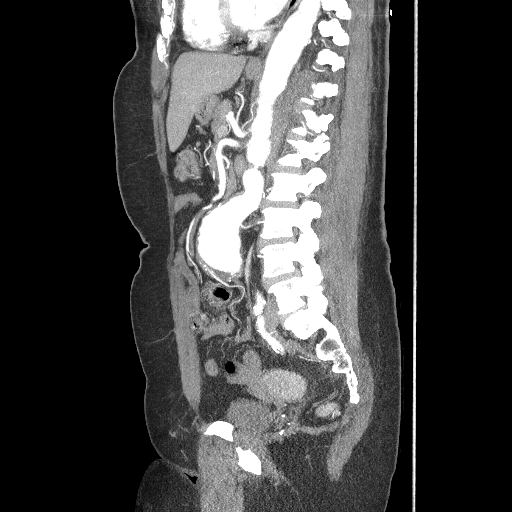

[16 of 46 positions shown; findings below may reference images not displayed]

FINDINGS: The lung bases are clear.  The liver, spleen, gallbladder and adrenal glands appear unremarkable.  Prominence of the pancreatic tail is unchanged and without abnormal attenuation or enhancement.  There is no pancreatic ductal dilatation.  The kidneys appear stable with multiple bilateral renal cysts.  
Again demonstrated is an infrarenal abdominal aortic aneurysm.  This arises 2.3 cm inferior to the renal arteries and has a maximal diameter of 5.0 cm.  Moderate ostial stenosis of the right renal artery is noted.  The left renal artery appears widely patent.  The inferior mesentery artery is patent.  Prestent vascular measurements are as follows:
A.  4.7 cm - Length of infrarenal neck below lowest renal artery to top of aneurysmal aorta
  Number of Renal Arteries  (R)1 (L) 1
B.  3.3 cm Diameter of infrarenal neck
C.   9.6 cm Total length of Aneurysm
  Does aneurysm end at Aortic Bifurcation? Yes
D. 5.0 cm Greatest aneurysm diameter
E.   1.6 cm (R) Greatest Common Iliac Artery diameters?
    1.4 cm (L)
F.   1.4 cm (R) Diameter of Common Iliac Arteries just above iliac bifurcation
  1.1 cm (L)
G. 2.3 cm (R) Approximate length of Common Iliac Arteries
  2.4 cm (L)
IMPRESSION: 1.  Interval enlargement of infrarenal abdominal aortic aneurysm, now with a maximal diameter of 5.0 cm.  
2.  Right renal artery ostial stenosis.  The inferior mesenteric artery is patent. 
3.  Bilateral renal cysts are again noted.  Prominence of the pancreatic tail is unchanged and attributed to a normal variant. 

CT ANGIOGRAPHY OF THE PELVIS:
FINDINGS: Iliac vascular measurements are given above.  There is no pelvic mass, fluid collection, or inflammatory process status post hysterectomy.  The ovaries appear stable.
IMPRESSION: Stable CT of the pelvis.  As noted previously, the common iliac arteries are relatively short.

## 2006-11-29 ENCOUNTER — Ambulatory Visit: Payer: Self-pay | Admitting: Unknown Physician Specialty

## 2007-03-08 ENCOUNTER — Encounter: Admission: RE | Admit: 2007-03-08 | Discharge: 2007-03-08 | Payer: Self-pay | Admitting: Vascular Surgery

## 2007-03-08 ENCOUNTER — Ambulatory Visit: Payer: Self-pay | Admitting: Vascular Surgery

## 2007-03-08 IMAGING — CT CT ANGIO ABDOMEN
2 of 5 series · 16 of 46 positions shown, 18 images · IV contrast ([ID] OMNI 350)
Comparison: none

CLINICAL DATA: Pre-stent AAA.
 The following pre-stent graft measurements were obtained:
 A.  46 mm  Length of infrarenal neck below lowest renal artery to top of aneurysmal aorta
   Number of Renal Arteries  (R)1 (L)1
 B.  21 mm  Diameter of infrarenal neck
 C.  93 mm  Total length of Aneurysm
   Does aneurysm end at Aortic Bifurcation? Yes
 D.  50 mm   Greatest aneurysm diameter
 E.  15 mm  (R) Greatest Common Iliac Artery diameters?
   13 mm  (L)
 F.  14 mm  (R) Diameter of Common Iliac Arteries just above iliac bifurcation
   10 mm  (L)
 G.  21 mm  (R) Approximate length of Common Iliac Arteries
   24 mm  (L)
 H.  Comments:  Comparing to the measurements obtained on [DATE] the only discrepancy is the diameter of the infrarenal neck which appears to have been a typographical error which was measured at 23 mm at that time. 
 CT ANGIOGRAPHY OF ABDOMEN:
TECHNIQUE: Multidetector CT imaging of the abdomen was performed during bolus injection of intravenous contrast.  Multiplanar   CT angiographic image reconstructions were generated to evaluate the vascular anatomy.
TECHNIQUE: Multidetector CT imaging of the pelvis was performed during bolus injection of intravenous contrast.  Multiplanar   CT angiographic image reconstructions were generated to evaluate the vascular anatomy.

[Series 5: angio · axial · 0.66mm/px · z∈[-420,-2]mm · 13 of 187 slices shown, 15 images]
[im 10/187  soft-tissue]
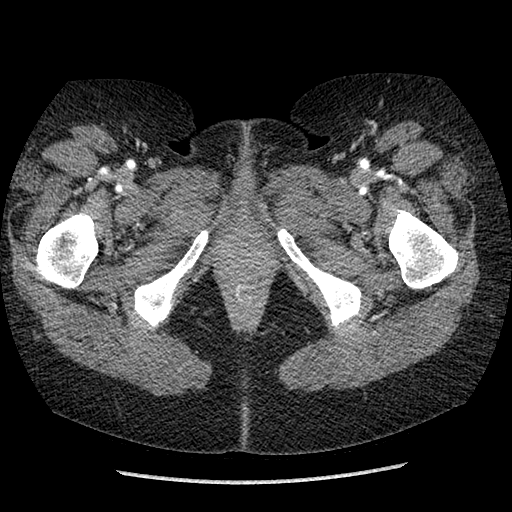
[im 10/187  bone]
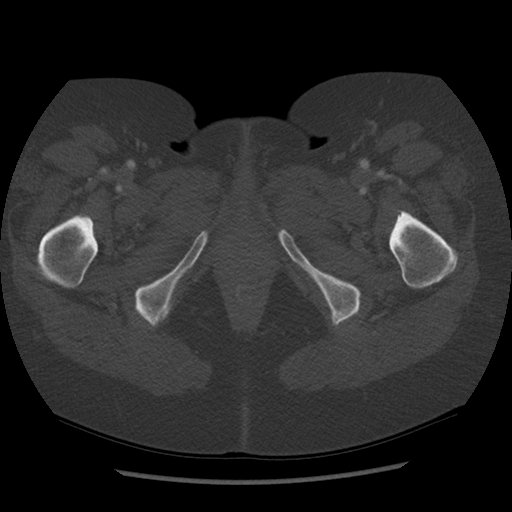
[im 28/187  soft-tissue]
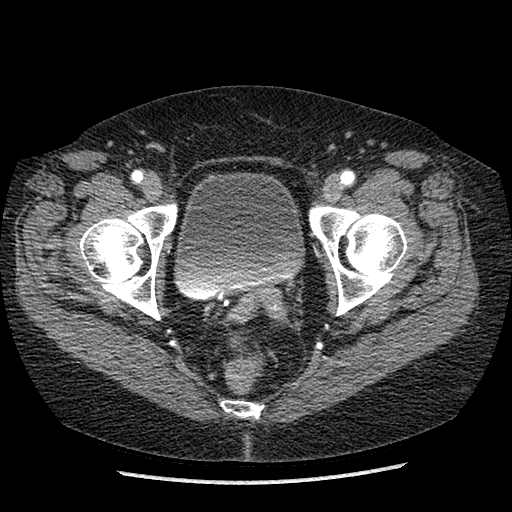
[im 38/187  soft-tissue]
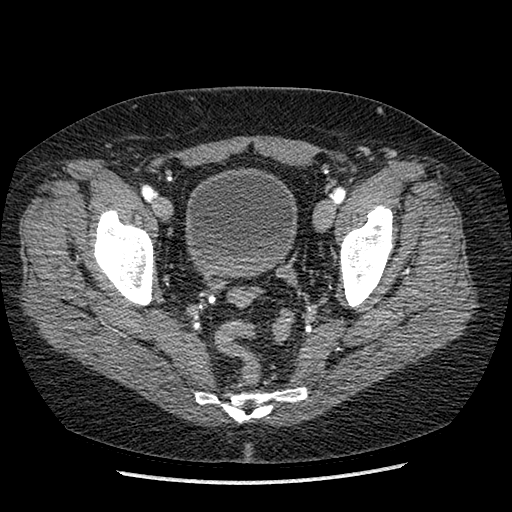
[im 56/187  soft-tissue]
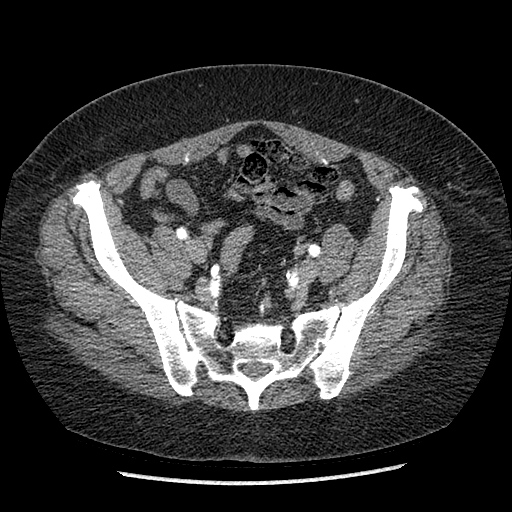
[im 66/187  soft-tissue]
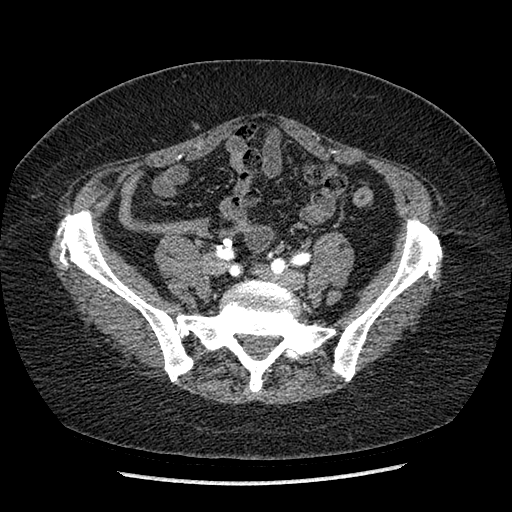
[im 84/187  soft-tissue]
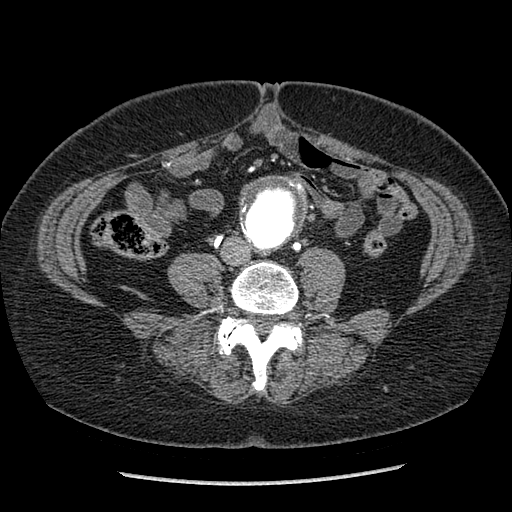
[im 94/187  soft-tissue]
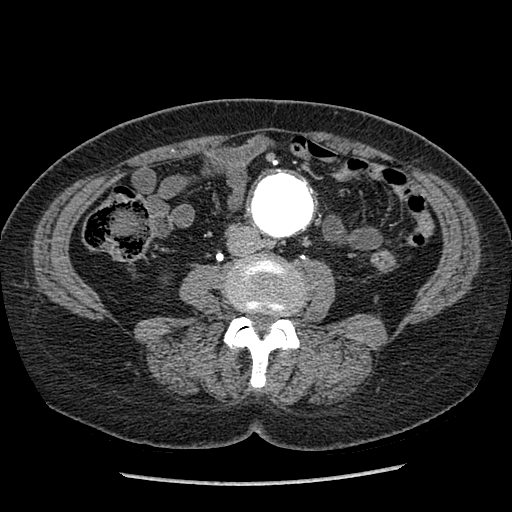
[im 103/187  soft-tissue]
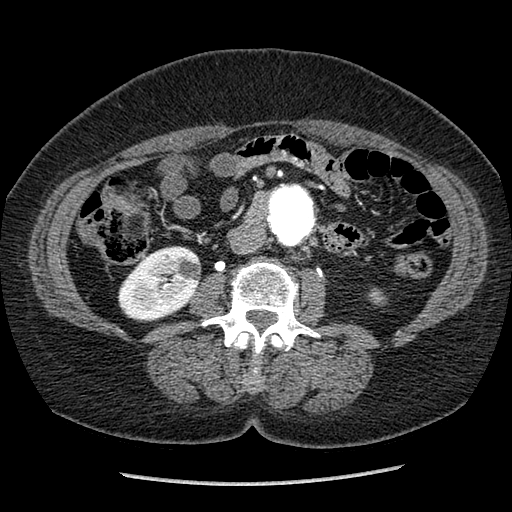
[im 121/187  soft-tissue]
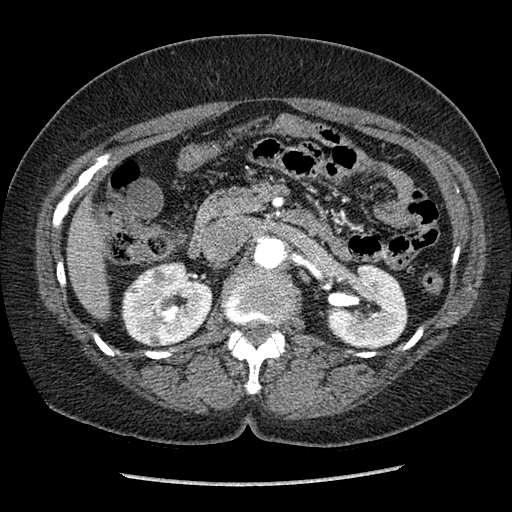
[im 121/187  bone]
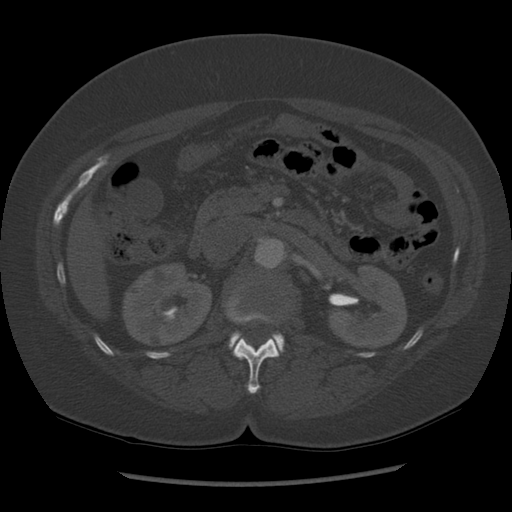
[im 131/187  soft-tissue]
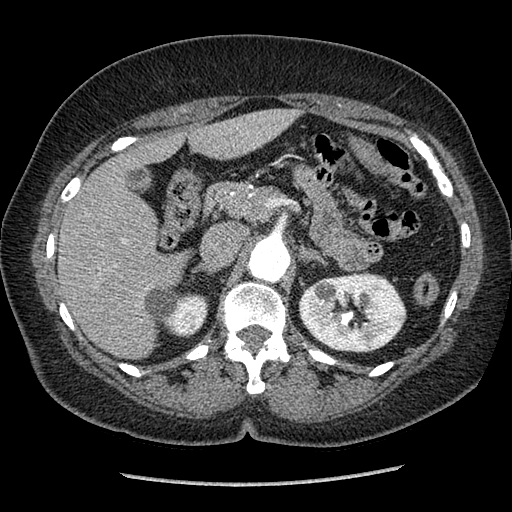
[im 149/187  soft-tissue]
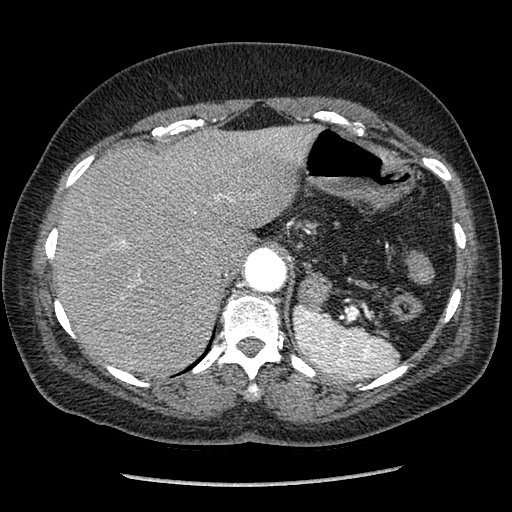
[im 159/187  soft-tissue]
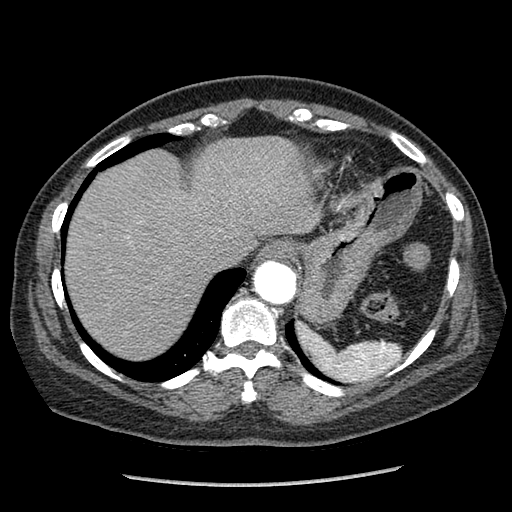
[im 177/187  soft-tissue]
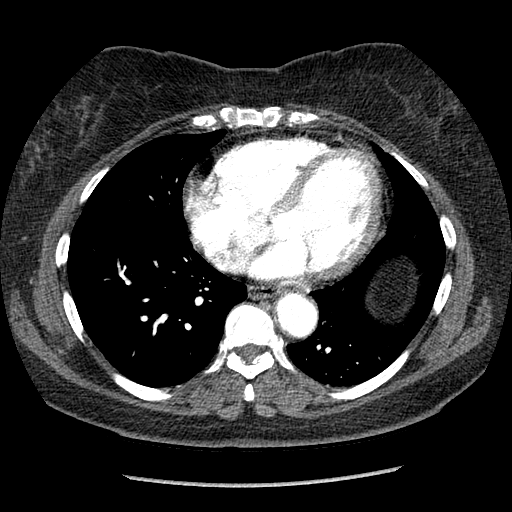

[Series 602: sagittal angio · sagittal · 0.93mm/px · 3 of 137 slices shown]
[im 46/137  soft-tissue]
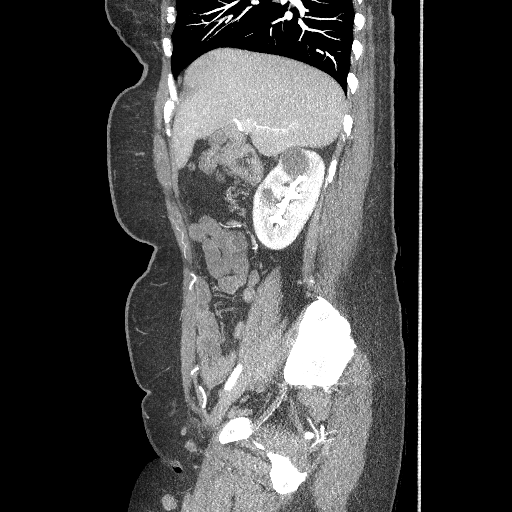
[im 61/137  soft-tissue]
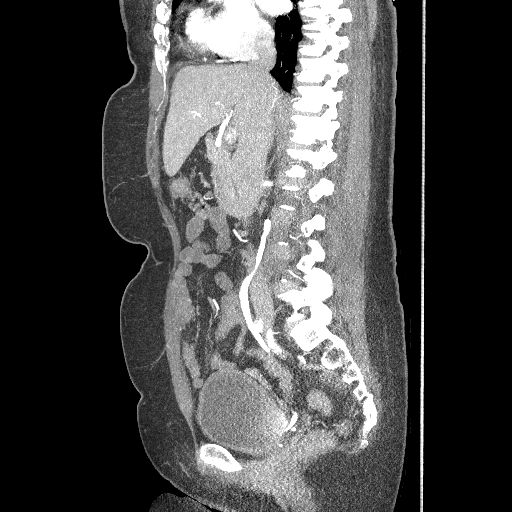
[im 76/137  soft-tissue]
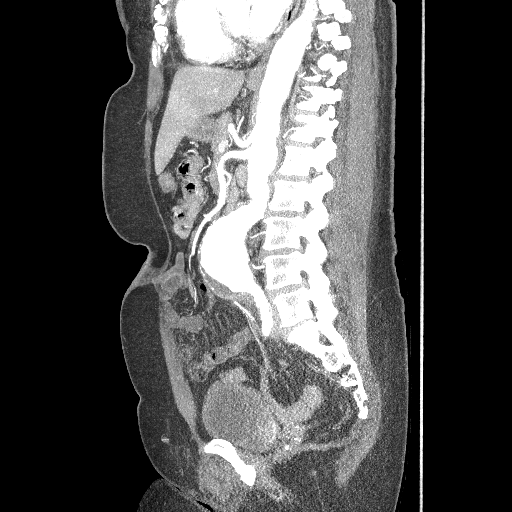

[16 of 46 positions shown; findings below may reference images not displayed]

FINDINGS: The remainder of the study on the unenhanced portion shows no calcified gallstones.  Multiple bilateral renal cysts are noted. 
 After contrast administration the lung bases are clear.  The liver enhances with no focal abnormality.  The gallbladder is well seen and normal.  The pancreas is normal in size and configuration and the adrenal glands and spleen also are normal and stable.  The kidneys enhance normally and the pelvocaliceal systems appear normal.
IMPRESSION: 50 mm greatest diameter infrarenal AAA with additional pre-stent graft measurements given above. 
 CT ANGIOGRAPHY OF PELVIS:
FINDINGS: It is noted that the common iliac arteries are rather short with the right measuring 21 mm and the left measuring 24 mm.  No aneurysm is seen.  Urinary bladder is unremarkable.  Patient has previously undergone hysterectomy.
IMPRESSION: Short common iliac arteries as noted above.  Additional pre-stent graft measurements given in above CT angio abdomen dictation.

## 2007-08-29 ENCOUNTER — Ambulatory Visit: Payer: Self-pay | Admitting: Vascular Surgery

## 2007-12-26 ENCOUNTER — Other Ambulatory Visit: Payer: Self-pay

## 2007-12-26 ENCOUNTER — Emergency Department: Payer: Self-pay | Admitting: Emergency Medicine

## 2007-12-26 IMAGING — CT CT ABD-PELV W/O CM
1 of 2 series · 15 of 32 positions shown, 19 images · non-contrast
Comparison: none

REASON FOR EXAM: Lower abdominal pain
COMMENTS:

[Series 2: stone · axial · 0.74mm/px · z∈[+18,+432]mm · 15 of 151 slices shown, 19 images]
[im 7/151  soft-tissue]
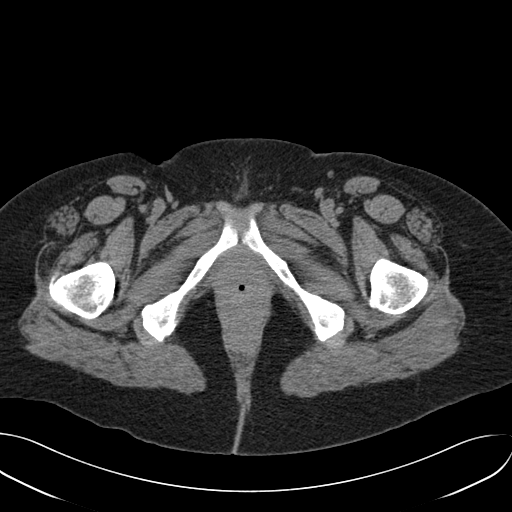
[im 7/151  bone]
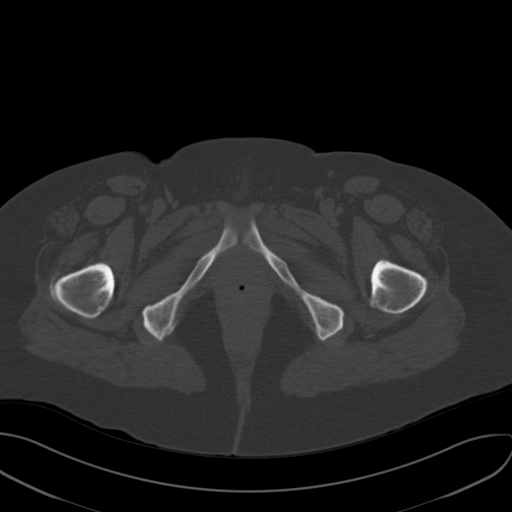
[im 19/151  soft-tissue]
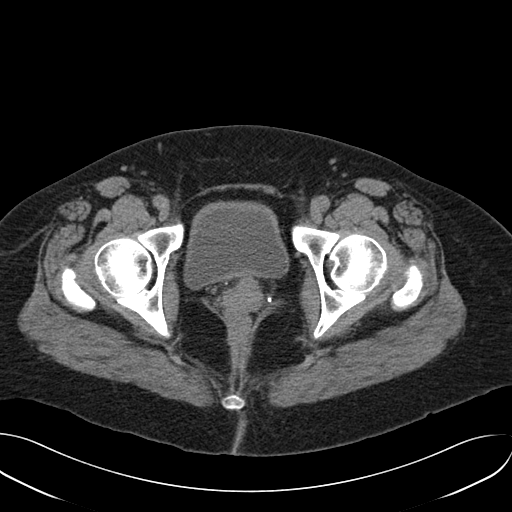
[im 31/151  soft-tissue]
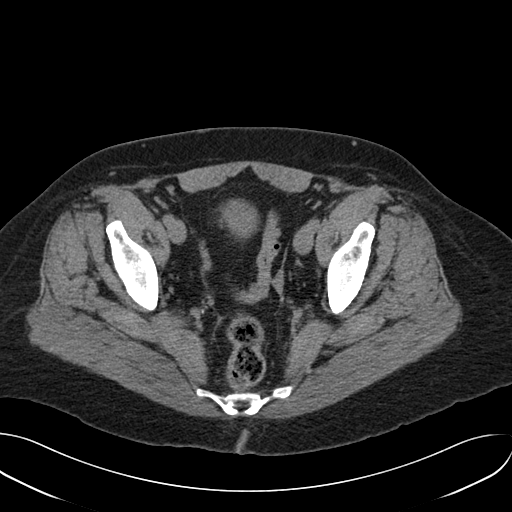
[im 43/151  soft-tissue]
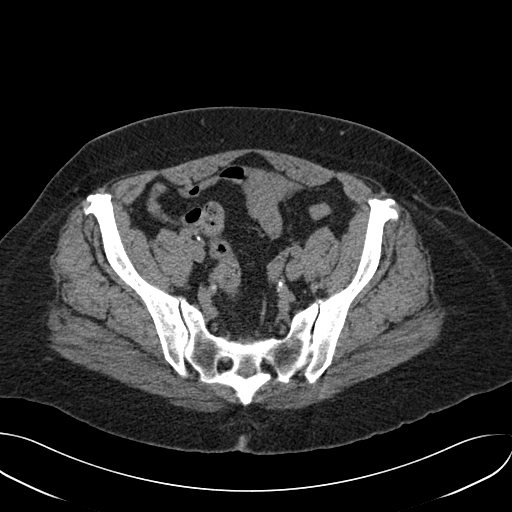
[im 55/151  soft-tissue]
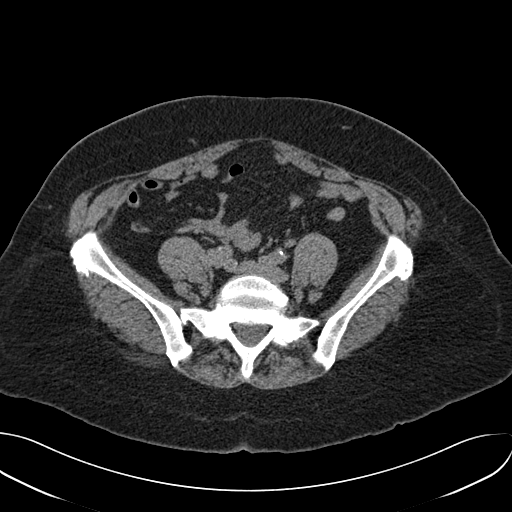
[im 67/151  soft-tissue]
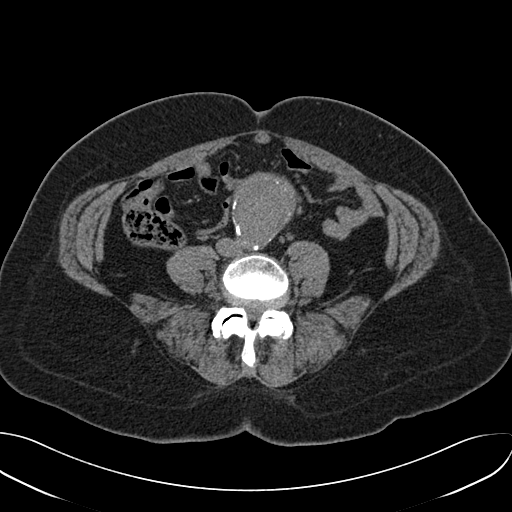
[im 79/151  soft-tissue]
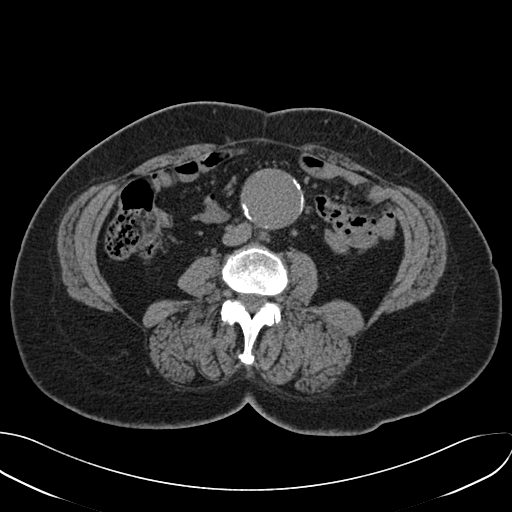
[im 85/151  soft-tissue]
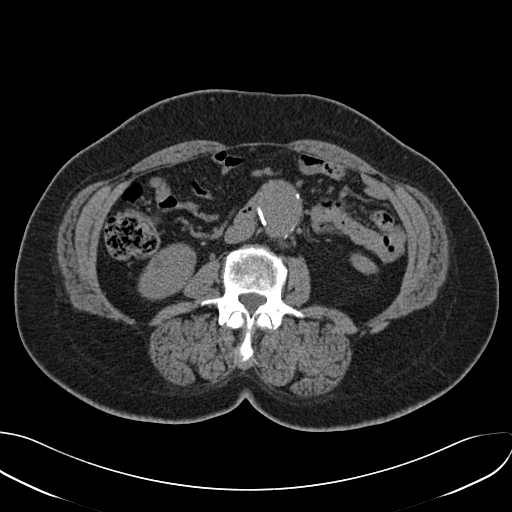
[im 97/151  soft-tissue]
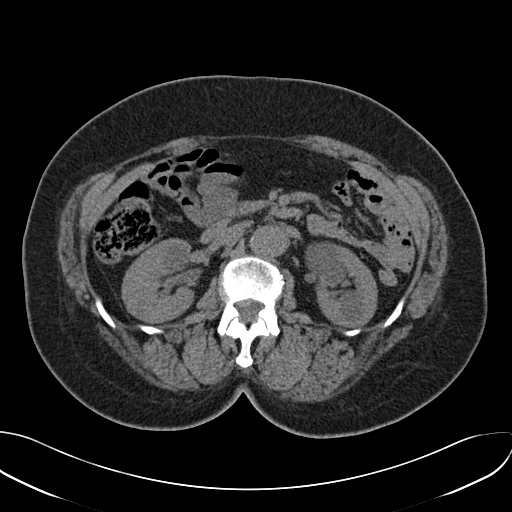
[im 97/151  bone]
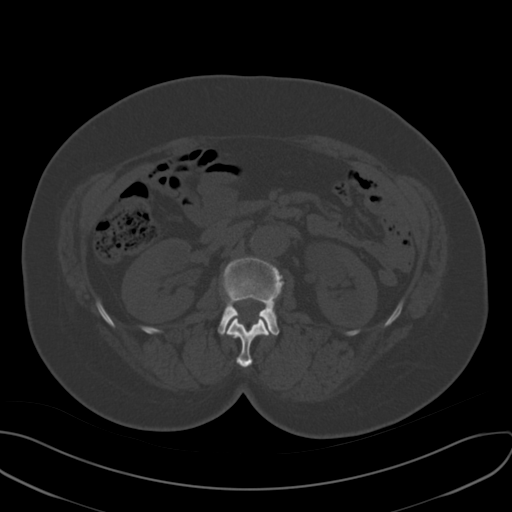
[im 109/151  soft-tissue]
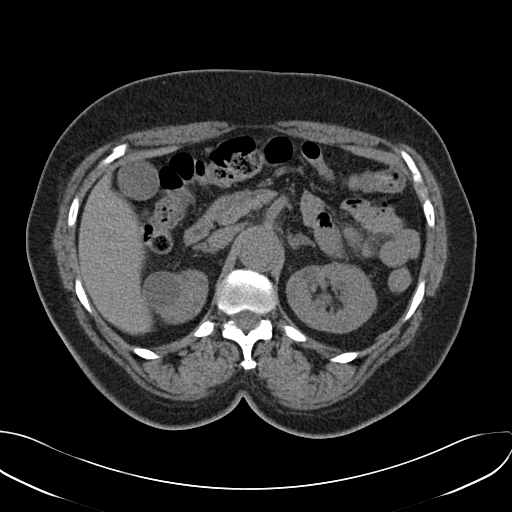
[im 121/151  soft-tissue]
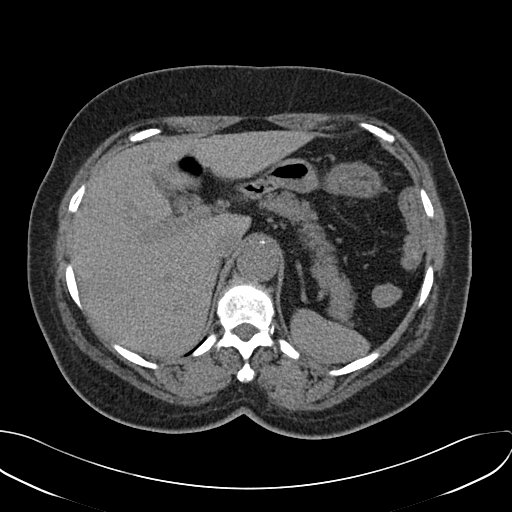
[im 127/151  lung]
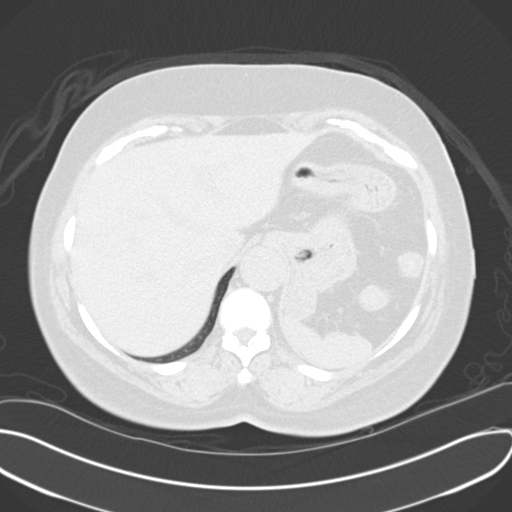
[im 133/151  soft-tissue]
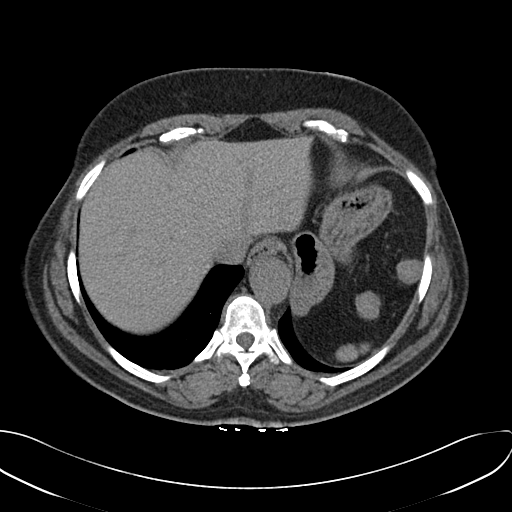
[im 133/151  lung]
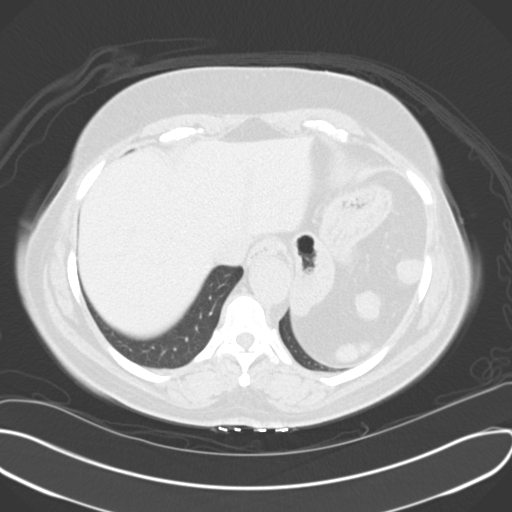
[im 139/151  lung]
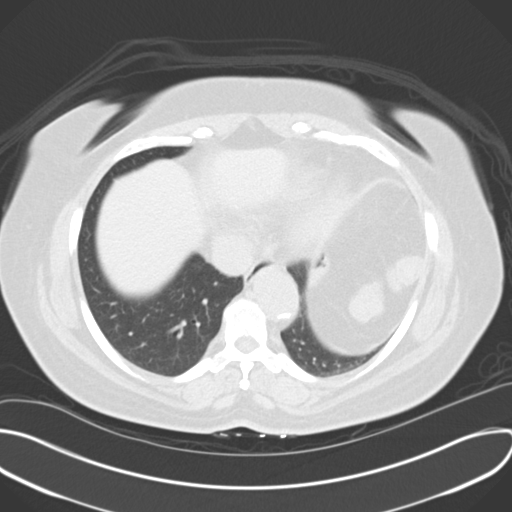
[im 145/151  soft-tissue]
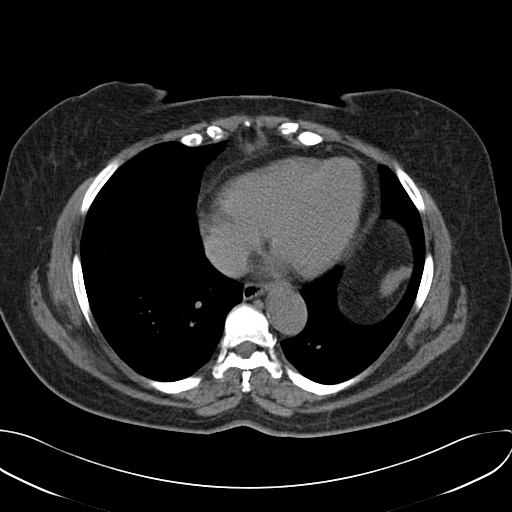
[im 145/151  lung]
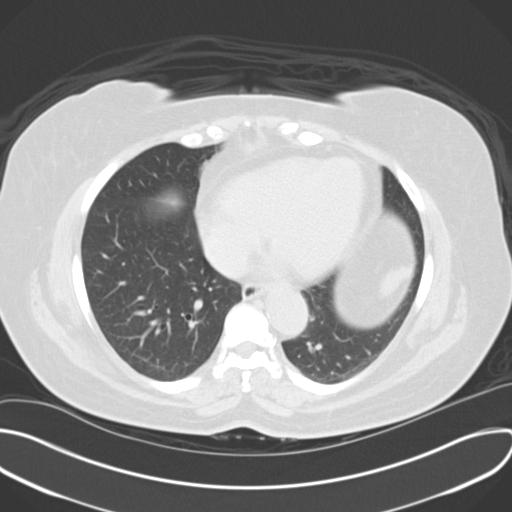

[15 of 32 positions shown; findings below may reference images not displayed]

PROCEDURE:     CT  - CT ABDOMEN AND PELVIS W[DATE]  [DATE]

RESULT:     The study was performed without oral or intravenous contrast
material.

There is an infrarenal abdominal aortic aneurysm. It measures approximately
5.8 cm AP x approximately 5.3 cm transversely. The common iliac arteries
appear normal. The caliber of the aorta normalizes above and at the level of
the renal arteries. There are hypo densities noted in both kidneys which
have been seen in the past and which are compatible with cysts. The adrenal
glands, liver, gallbladder, spleen, nondistended stomach and pancreas are
normal in appearance. The unopacified loops of small and large bowel exhibit
no acute abnormality. There is no free fluid in the abdomen or pelvis. The
partially distended urinary bladder is grossly normal. The prostate gland
produces a mild impression upon the urinary bladder base. The preliminary
reviewer indicated the possibility of there being a periurethral
diverticulum. This certainly cannot be excluded given the appearance of the
findings in the urethra region but contrast will be needed to confirm this.
There are phleboliths within the pelvis. The lung bases are clear.
IMPRESSION: 1.  There is an infrarenal abdominal aortic aneurysm measuring as much as
5.8 cm AP x 5.3 cm transversely. There is no perianeurysmal leakage of
blood.
2.  There are hypo densities associated with both kidneys most compatible
with cysts.
3.  I see no evidence of acute hepatobiliary abnormality.
4.  There may be a urethral diverticulum associated with the prostatic and
proximal penile urethra.

A preliminary report was sent to the [HOSPITAL] the conclusion
of the study.

## 2008-03-12 ENCOUNTER — Ambulatory Visit: Payer: Self-pay | Admitting: Vascular Surgery

## 2008-03-12 ENCOUNTER — Encounter: Admission: RE | Admit: 2008-03-12 | Discharge: 2008-03-12 | Payer: Self-pay | Admitting: Vascular Surgery

## 2008-03-12 IMAGING — CT CT ANGIO ABDOMEN
2 of 5 series · 15 of 46 positions shown, 17 images · IV contrast ([ID] OMNI 350)
Comparison: [DATE] and earlier.

CTA ABDOMEN

CLINICAL DATA: 61-year-old female with abdominal aortic aneurysm,
pre stent evaluation.

CT ANGIOGRAPHY OF ABDOMEN AND PELVIS WITHOUT AND/OR WITH CONTRAST-
PRESTENT PROTOCOL
TECHNIQUE: Multidetector CT imaging of the abdomen and pelvis was
performed before and during bolus injection of intravenous
contrast.  Multiplanar CT angiographic image reconstructions were
also generated to evaluate the vascular anatomy.
Contrast: 100 ml Omnipaque 350.

[Series 5: angio · axial · 0.78mm/px · z∈[-421,-1]mm · 12 of 186 slices shown, 14 images]
[im 9/186  soft-tissue]
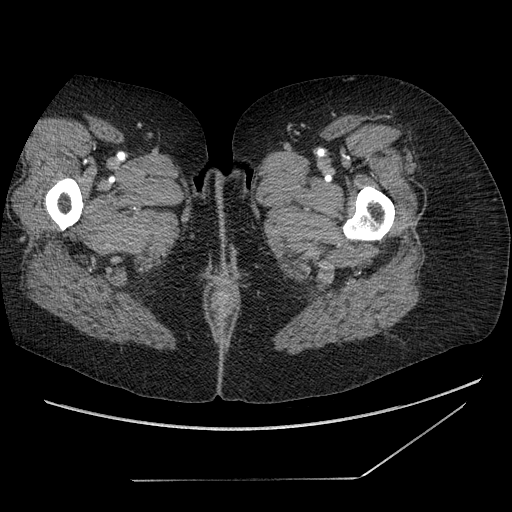
[im 9/186  bone]
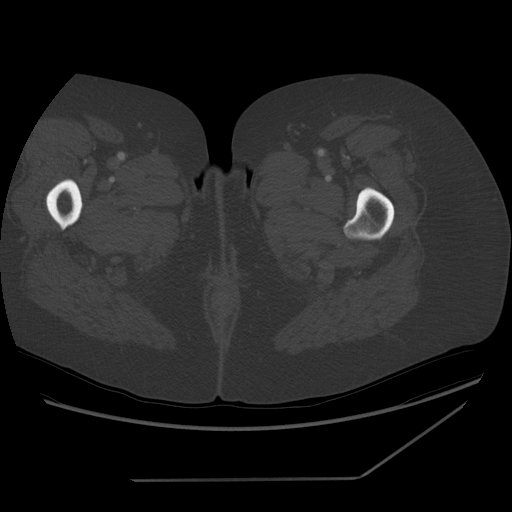
[im 27/186  soft-tissue]
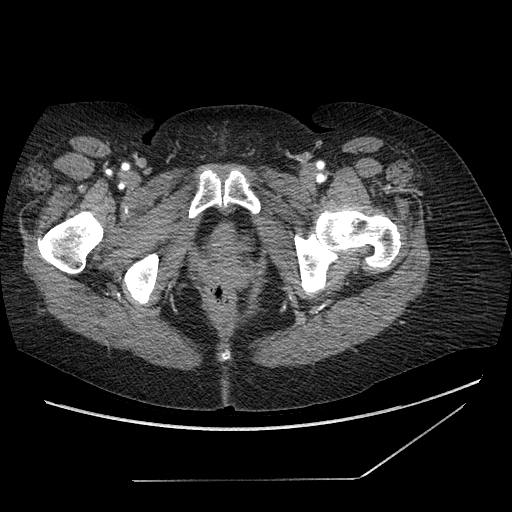
[im 45/186  soft-tissue]
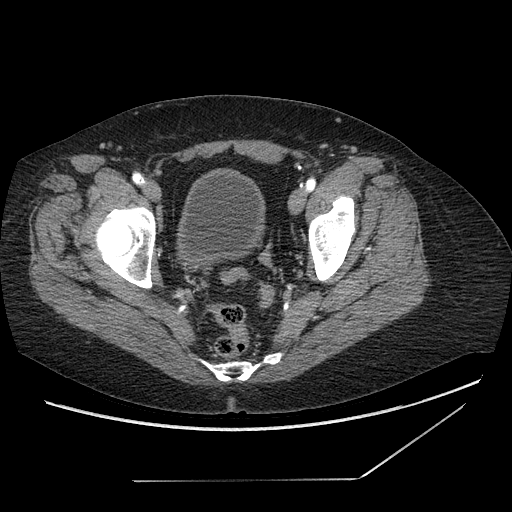
[im 53/186  soft-tissue]
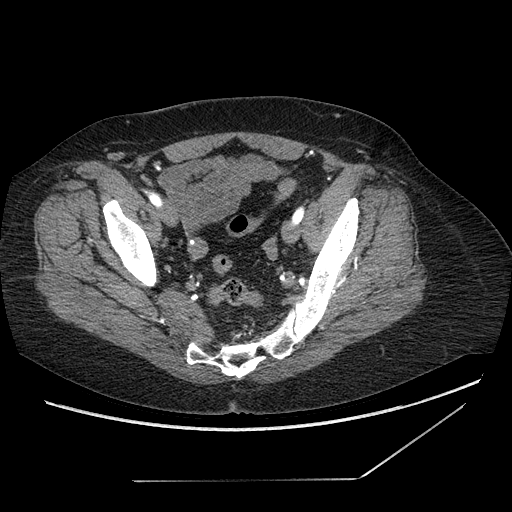
[im 71/186  soft-tissue]
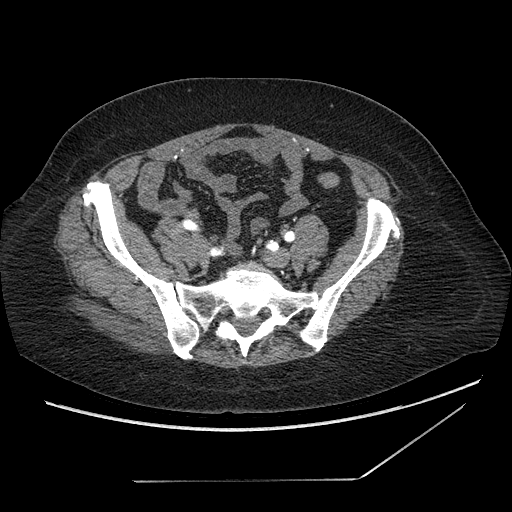
[im 89/186  soft-tissue]
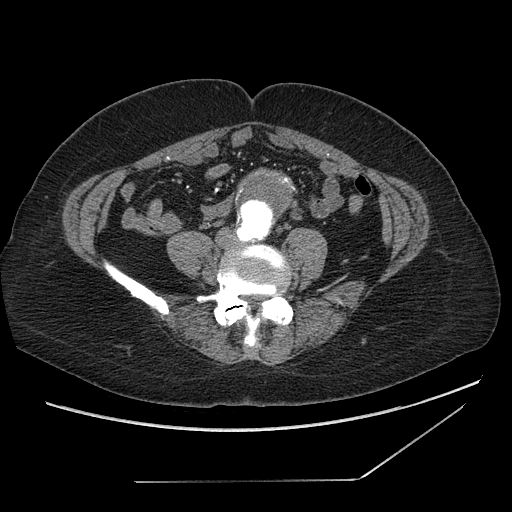
[im 97/186  soft-tissue]
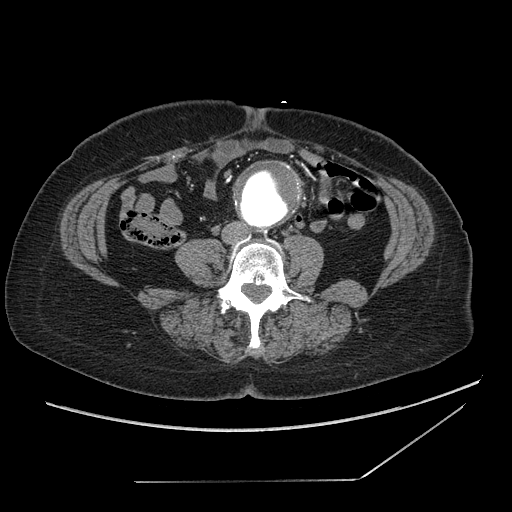
[im 115/186  soft-tissue]
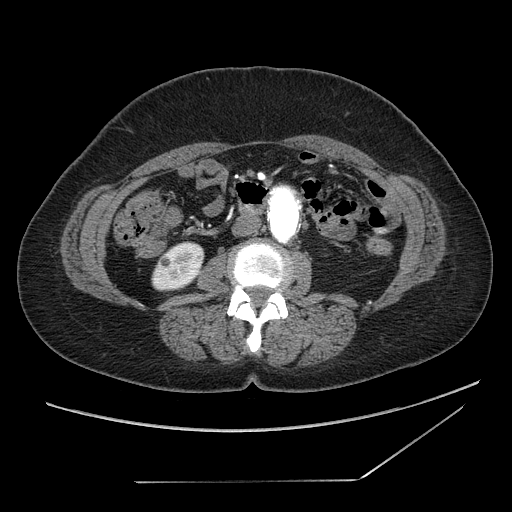
[im 133/186  soft-tissue]
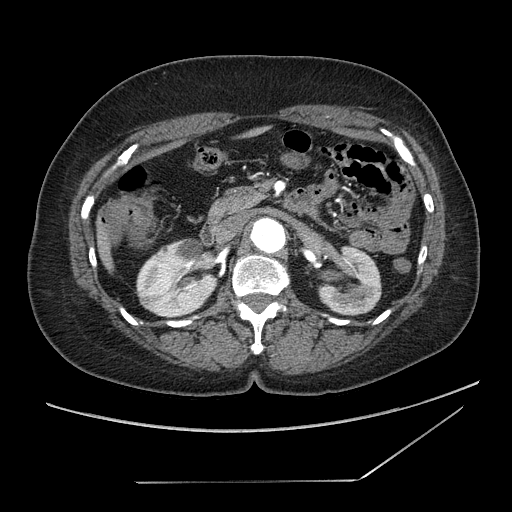
[im 133/186  bone]
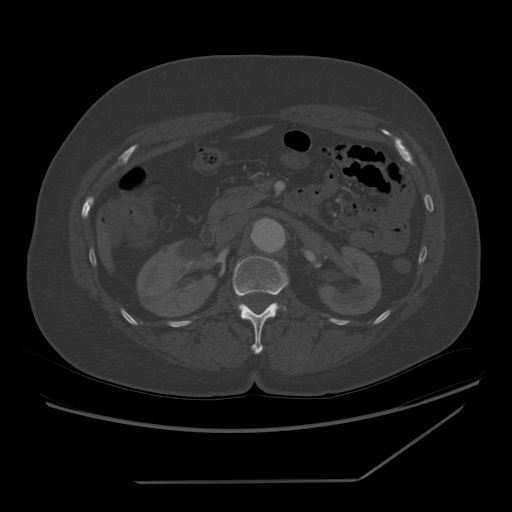
[im 141/186  soft-tissue]
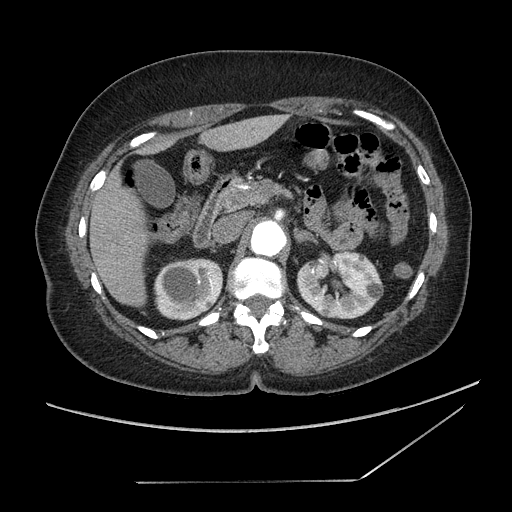
[im 159/186  soft-tissue]
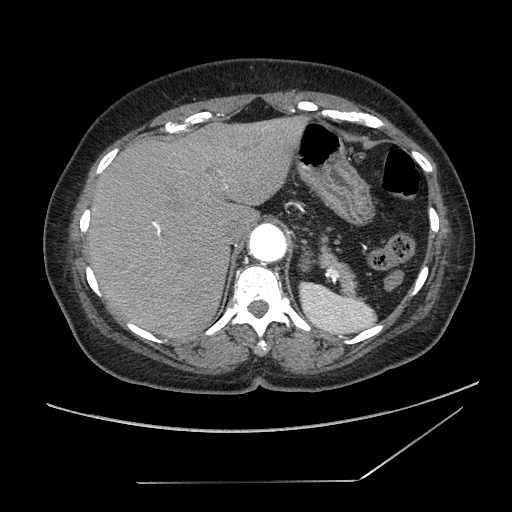
[im 177/186  soft-tissue]
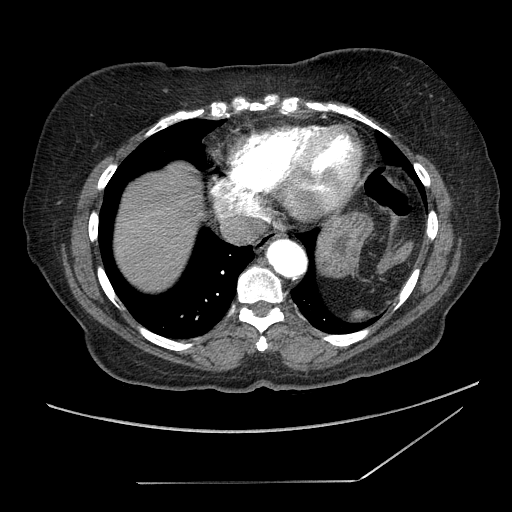

[Series 602: sagittal body · sagittal · 0.90mm/px · 3 of 161 slices shown]
[im 54/161  soft-tissue]
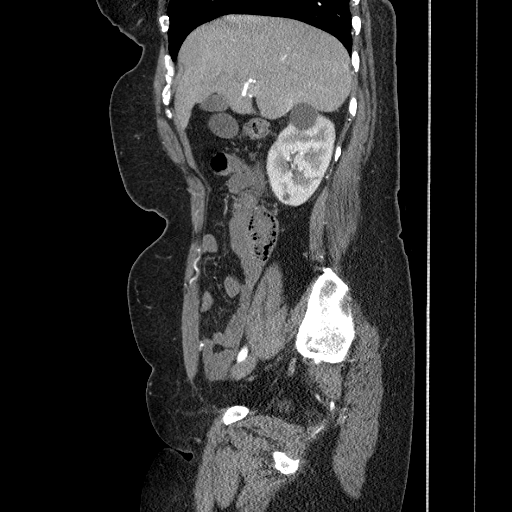
[im 72/161  soft-tissue]
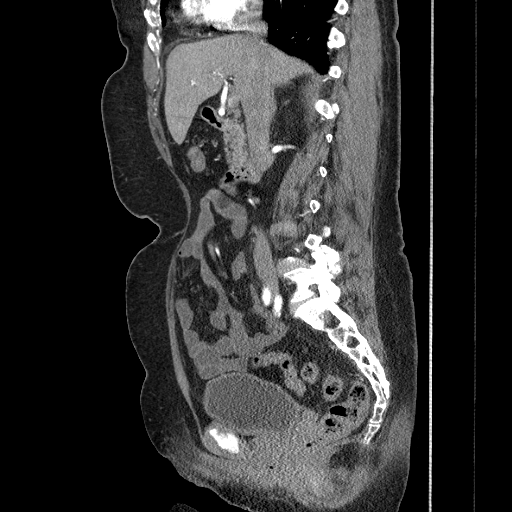
[im 89/161  soft-tissue]
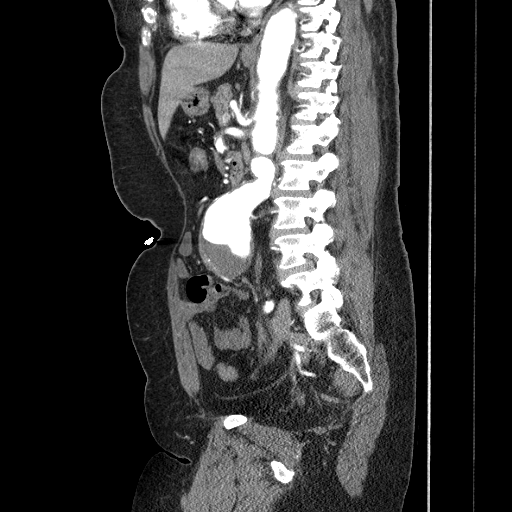

[15 of 46 positions shown; findings below may reference images not displayed]

FINDINGS: Mild elevation of both hemidiaphragms and minor
atelectasis at the lung bases.  No acute osseous abnormality.
Liver, gallbladder (phrygian cap), spleen, pancreas, adrenal
glands, stomach, duodenum and proximal small bowel loops are within
normal limits.  No free fluid.  Visualized distal colon is
decompressed.  Proximal colon contains stool.  No dilated bowel or
focal inflammation.  No lymphadenopathy.

Numerous bilateral low-density renal lesions are unchanged since
[DATE], but increased in size and number since [OU].

Vascular Findings: Ectatic thoracoabdominal aorta at the level of
the hiatus measuring up to 3.1 cm in diameter.  Fairly diffuse soft
plaque.  The celiac axis and SMA are patent.  The single main renal
arteries are patent bilaterally, however, there is high-grade
stenosis of both main renal arteries with particular irregularity
of the left ostia, mildly progressed since the prior studies.
There is symmetric renal enhancement despite this.  The IMA is
patent, arising from the left ventral sac on series 5 image 85.

Length of infrarenal neck (from lowest renal artery): 29 mm
Number of renal arteries:  Right = one; Left = one (see vascular
comments above)
Diameter of infrarenal neck:  21 - 22 mm
Total length of aneurysm:  100 mm
Aneurysm ends at aortic bifurcation: Yes  If no, Distance from
aneurysm to bifurcation: Not applicable
Greatest aneurysm diameter:  57 mmGreatest common iliac artery
diameters:  Right =15.5mm; Left = 14mm
Diameter of common iliac arteries just above iliac bifurcation:
Right = 15.5mm; Left = 14mm
Length of common iliac arteries:  Right = 26.5mm; Left = 27.5 mm
IMPRESSION: 1.  57 mm greatest diameter of the irregular infrarenal AAA, see
pre stent details above.
2.  Bilateral high-grade renal artery stenosis is stable - slowly
progressing over the series of exams.  There is particular
irregularity of the left RA ostia similar to prior exams.
3.  Numerous bilateral low-density renal lesions, slowly enlarging
since [OU], most likely represent cysts.

CTA PELVIS
FINDINGS: Visualized pelvic viscera are stable and within normal
limits.  The uterus is surgically absent.  The adnexa are within
normal limits.  No free fluid.  No acute osseous abnormality.  No
lymphadenopathy.

Vascular Findings: See above pre stent measurements for details of
the bilateral common iliac arteries which ectatic with soft and
calcified plaque.  Atherosclerosis involves the external and
internal iliacs as well.  Ectasia of the left internal iliac artery
is stable since [OU].  The bilateral common femoral arteries are
patent.  The visualized bilateral SFA and profunda arteries are
patent.
IMPRESSION: 1.  No acute findings in the pelvis.
2.  Stable ectasia and atherosclerosis of the iliac arteries as
detailed above.

## 2008-03-18 ENCOUNTER — Ambulatory Visit: Payer: Self-pay | Admitting: Vascular Surgery

## 2008-03-18 ENCOUNTER — Ambulatory Visit (HOSPITAL_COMMUNITY): Admission: RE | Admit: 2008-03-18 | Discharge: 2008-03-18 | Payer: Self-pay | Admitting: Vascular Surgery

## 2008-04-09 ENCOUNTER — Ambulatory Visit: Payer: Self-pay | Admitting: Vascular Surgery

## 2008-04-15 IMAGING — CR DG CHEST 2V
2 series · 2 of 2 positions shown · non-contrast
Comparison: No priors

CLINICAL DATA: Pre admit for aneurysm repair

CHEST - 2 VIEW

[view not recorded (1 of 2)]
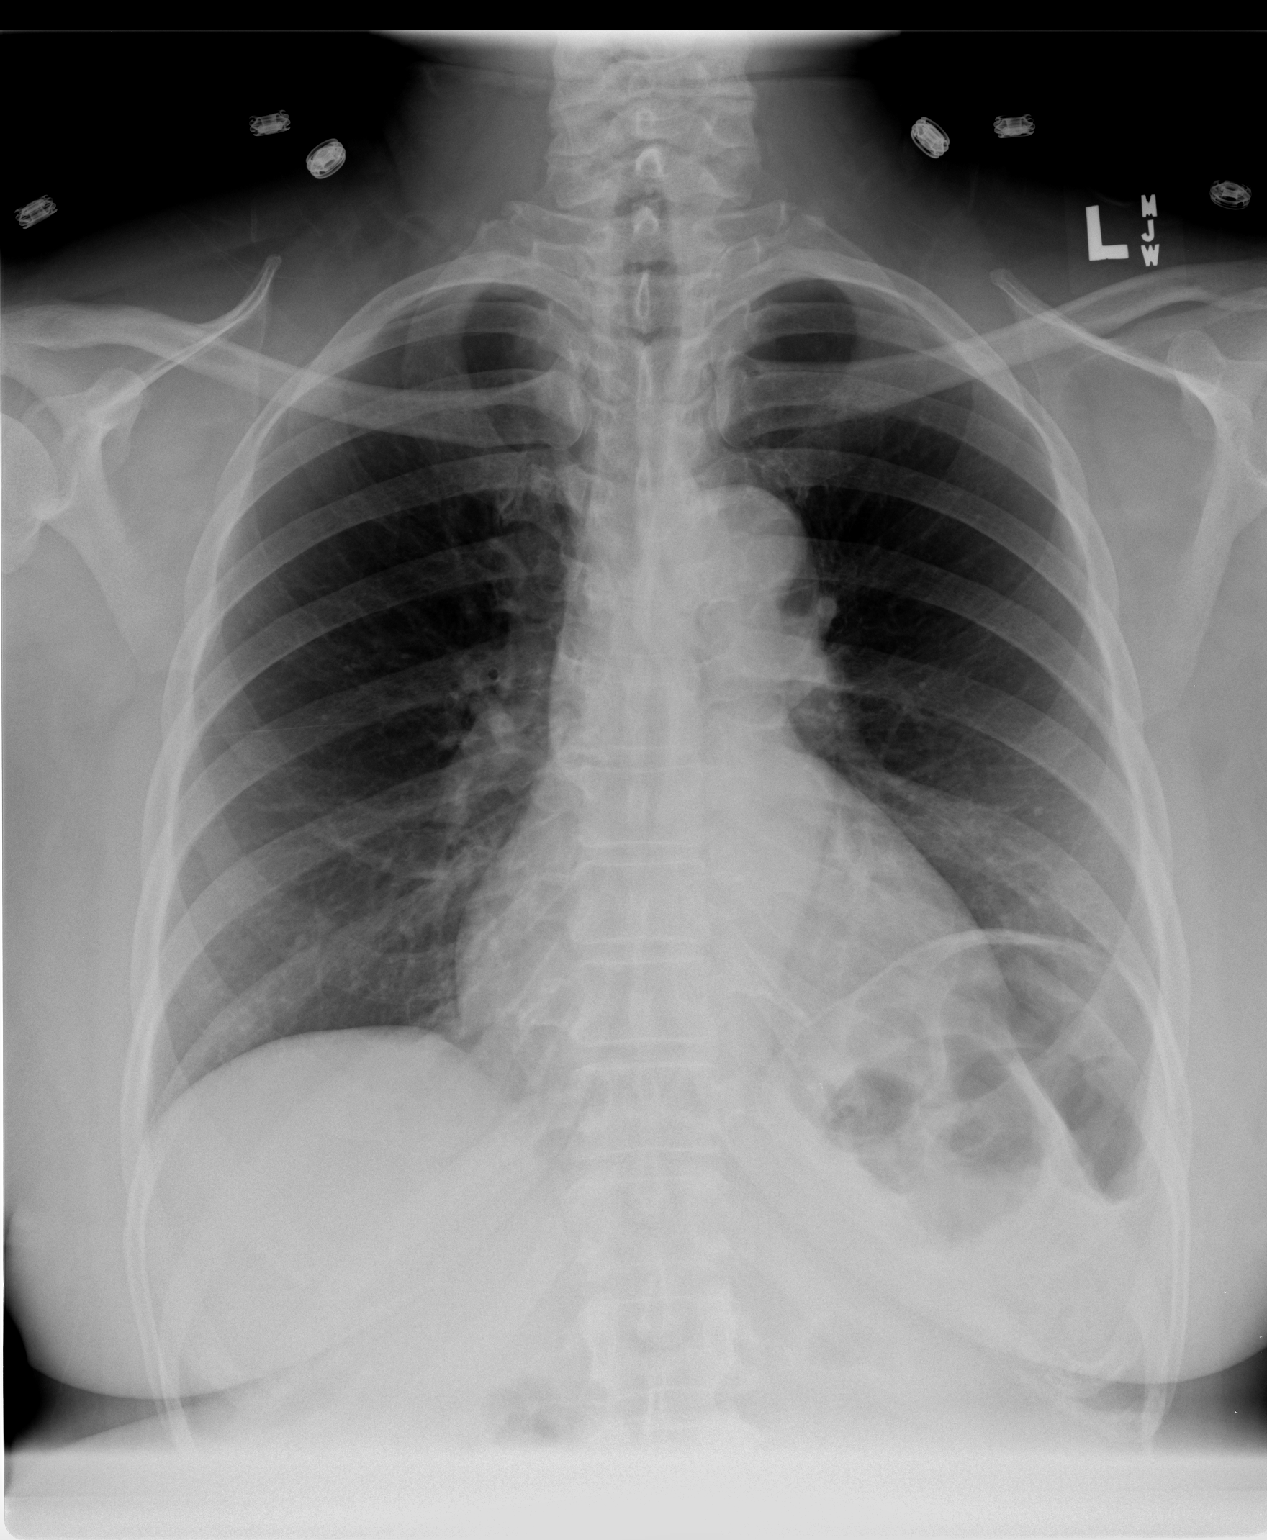

[view not recorded (2 of 2)]
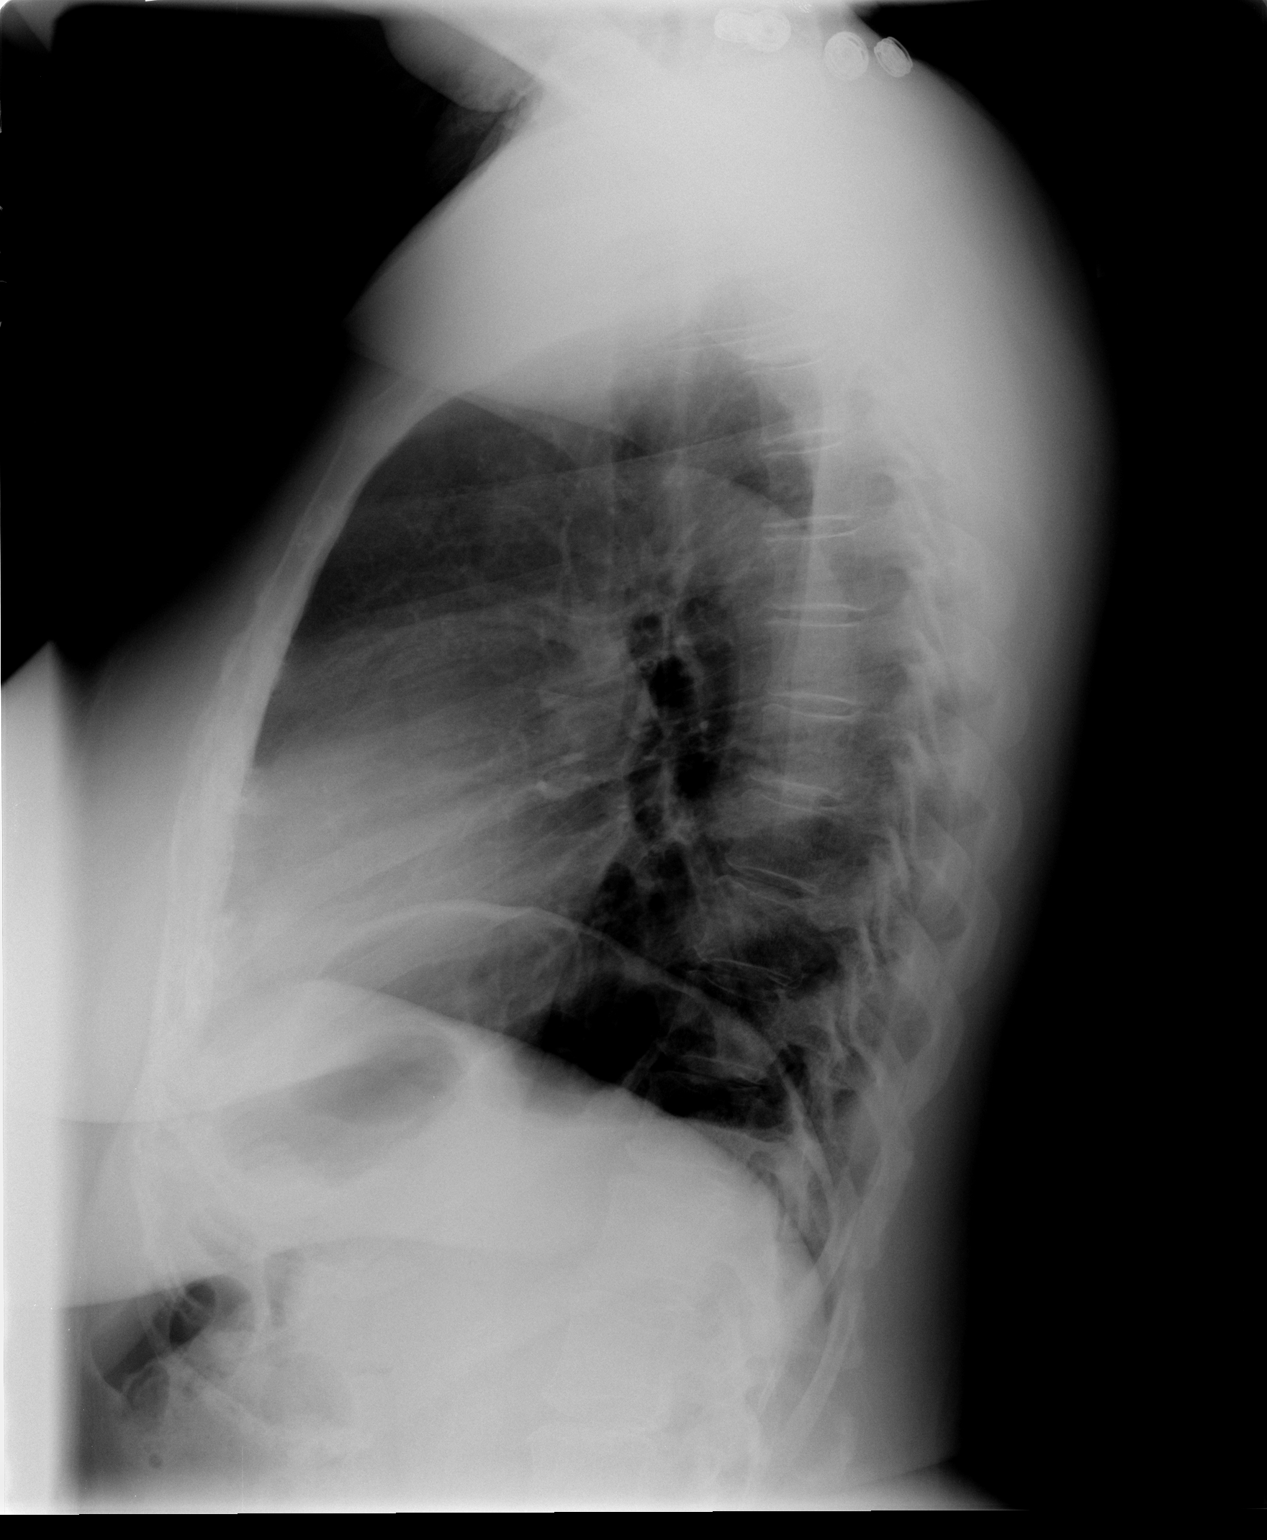

[2 of 2 positions shown; findings below may reference images not displayed]

FINDINGS: Heart mildly enlarged.  Aorta tortuous.  Lungs clear.
Left hemidiaphragm mildly elevated, possibly due to a distended
colon beneath the.  This is unlikely to be clinically significant.

No pleural fluid or osseous lesions.
IMPRESSION: 1.  Mild cardiomegaly.
2.  No active disease.  See note above regarding the left
hemidiaphragm being mildly elevated.

## 2008-04-18 ENCOUNTER — Inpatient Hospital Stay (HOSPITAL_COMMUNITY): Admission: RE | Admit: 2008-04-18 | Discharge: 2008-04-23 | Payer: Self-pay | Admitting: Vascular Surgery

## 2008-04-18 ENCOUNTER — Encounter: Payer: Self-pay | Admitting: Vascular Surgery

## 2008-04-18 ENCOUNTER — Ambulatory Visit: Payer: Self-pay | Admitting: Vascular Surgery

## 2008-04-18 IMAGING — CR DG CHEST 1V PORT
1 series · 1 of 1 positions shown · non-contrast
Comparison: [DATE] study

CLINICAL DATA: Postoperative chest evaluation after aneurysm
repair.

PORTABLE CHEST - 1 VIEW

[view not recorded]
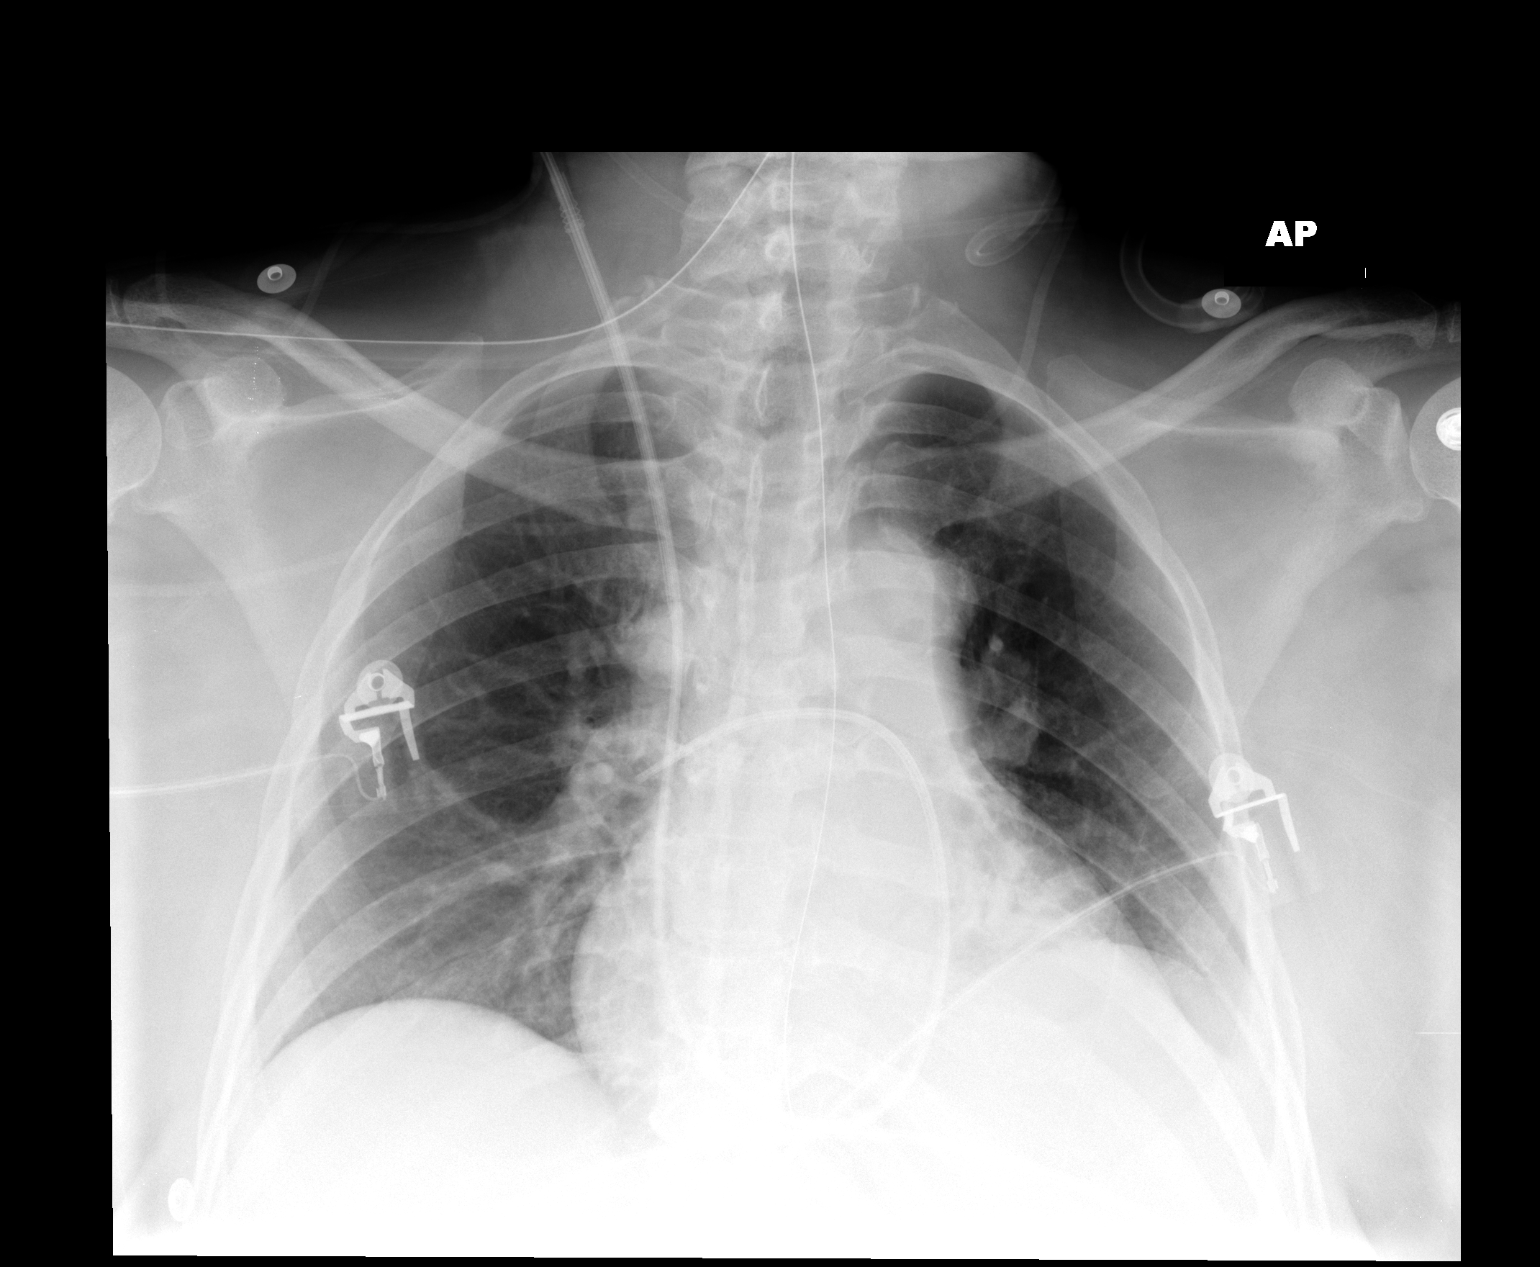

[1 of 1 positions shown; findings below may reference images not displayed]

FINDINGS: A Swan-Ganz catheter is in place entering from the right
jugular approach with its tip in the right descending pulmonary
artery.  No pneumothorax is evident.  Enteric tube is in place with
the distal portion entering the body of the stomach area.  The tip
of the enteric tube is not included on the image.

There is moderate enlargement of the cardiac silhouette.  There is
elevation left hemidiaphragm with mild atelectasis in the left lung
base.  Right lung is free of infiltrates.  No pleural effusion is
seen.
IMPRESSION: Enteric tube in place.  Swan-Ganz catheter in place.  No
pneumothorax evident.

Enlargement of the cardiac silhouette.  Minimal basilar
atelectasis..

## 2008-04-19 IMAGING — CR DG CHEST 1V PORT
1 series · 1 of 1 positions shown · non-contrast
Comparison: Portable chest [DATE].

CLINICAL DATA: Abdominal aortic aneurysm.

PORTABLE CHEST - 1 VIEW

[view not recorded]
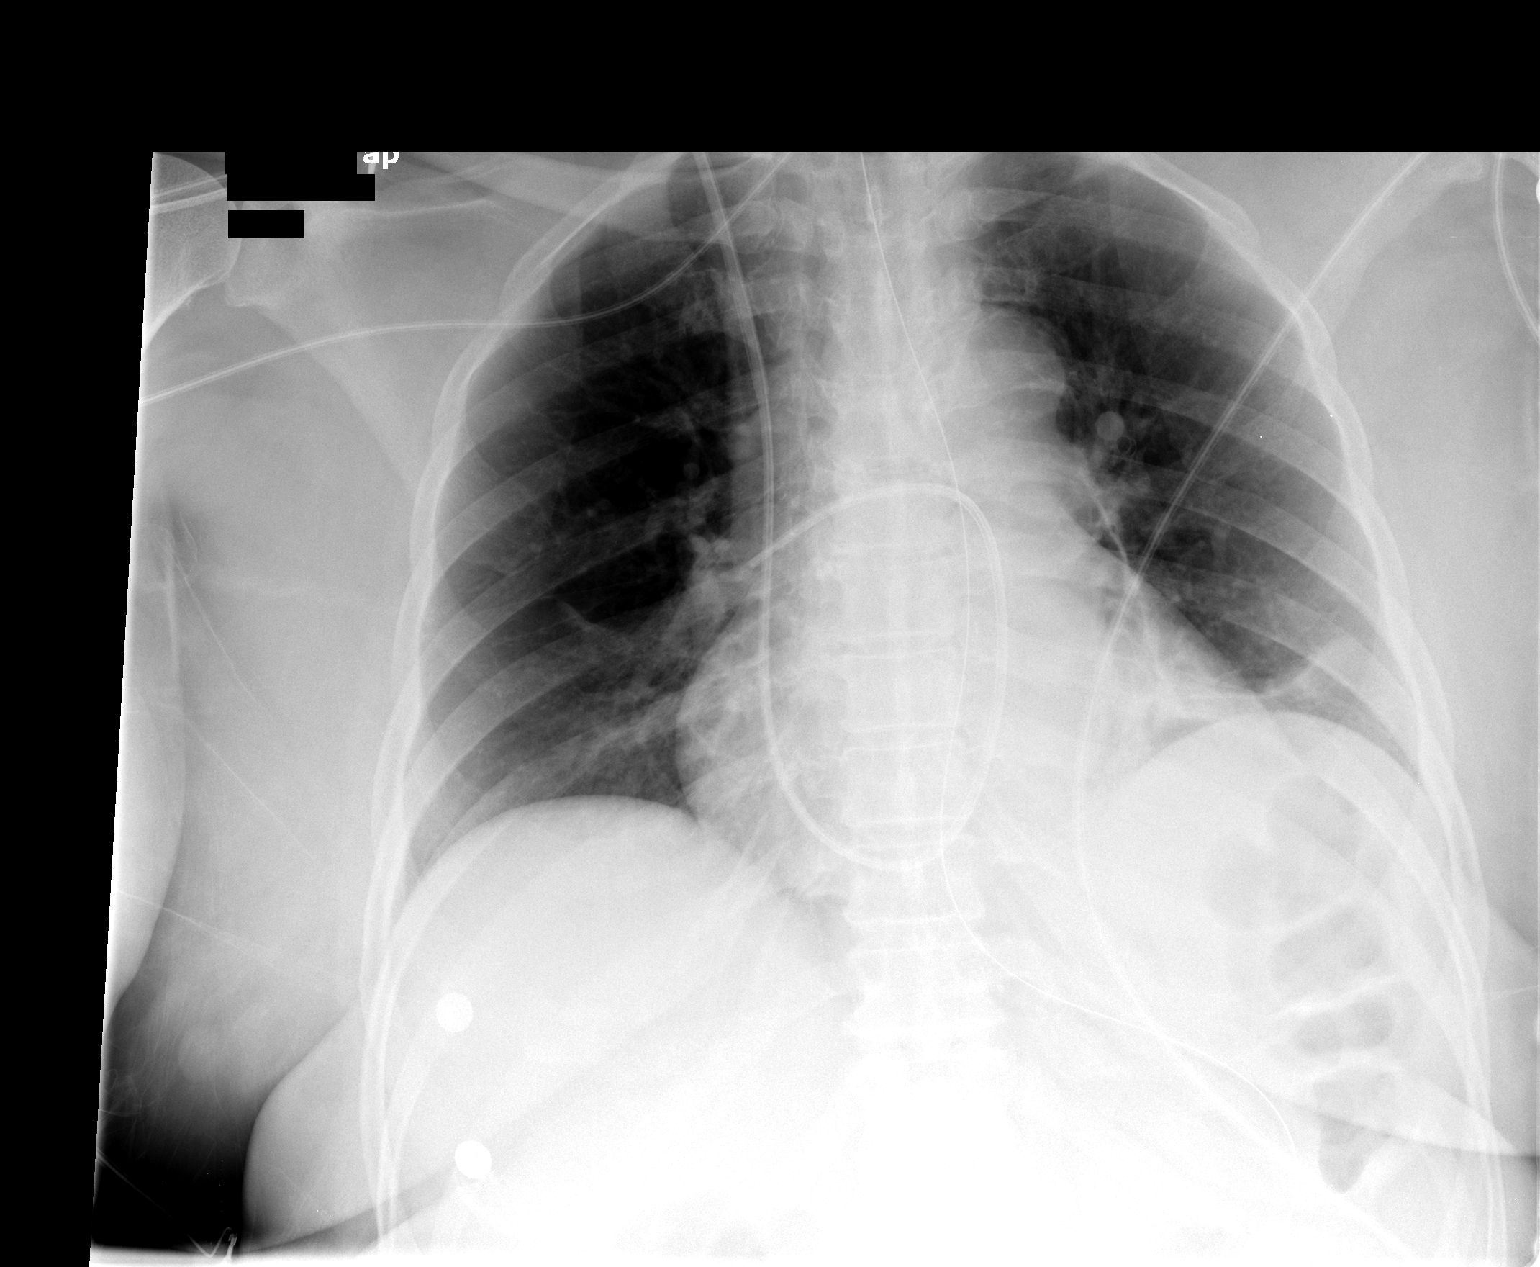

[1 of 1 positions shown; findings below may reference images not displayed]

FINDINGS: Support apparatus is unchanged.  There is been slight
increase in left basilar subsegmental atelectasis.  Lungs otherwise
clear.  Cardiomegaly.
IMPRESSION: Slight increase in left basilar atelectasis.  Otherwise, unchanged.

## 2008-05-07 ENCOUNTER — Ambulatory Visit: Payer: Self-pay | Admitting: Vascular Surgery

## 2008-11-12 ENCOUNTER — Ambulatory Visit: Payer: Self-pay | Admitting: Vascular Surgery

## 2009-02-11 ENCOUNTER — Ambulatory Visit: Payer: Self-pay | Admitting: Vascular Surgery

## 2010-01-22 ENCOUNTER — Ambulatory Visit: Payer: Self-pay | Admitting: Vascular Surgery

## 2010-03-05 ENCOUNTER — Emergency Department: Payer: Self-pay | Admitting: Unknown Physician Specialty

## 2010-03-05 IMAGING — CR DG CHEST 1V PORT
1 series · 1 of 1 positions shown · non-contrast
Comparison: none

REASON FOR EXAM: abd pain
COMMENTS:

[view not recorded]
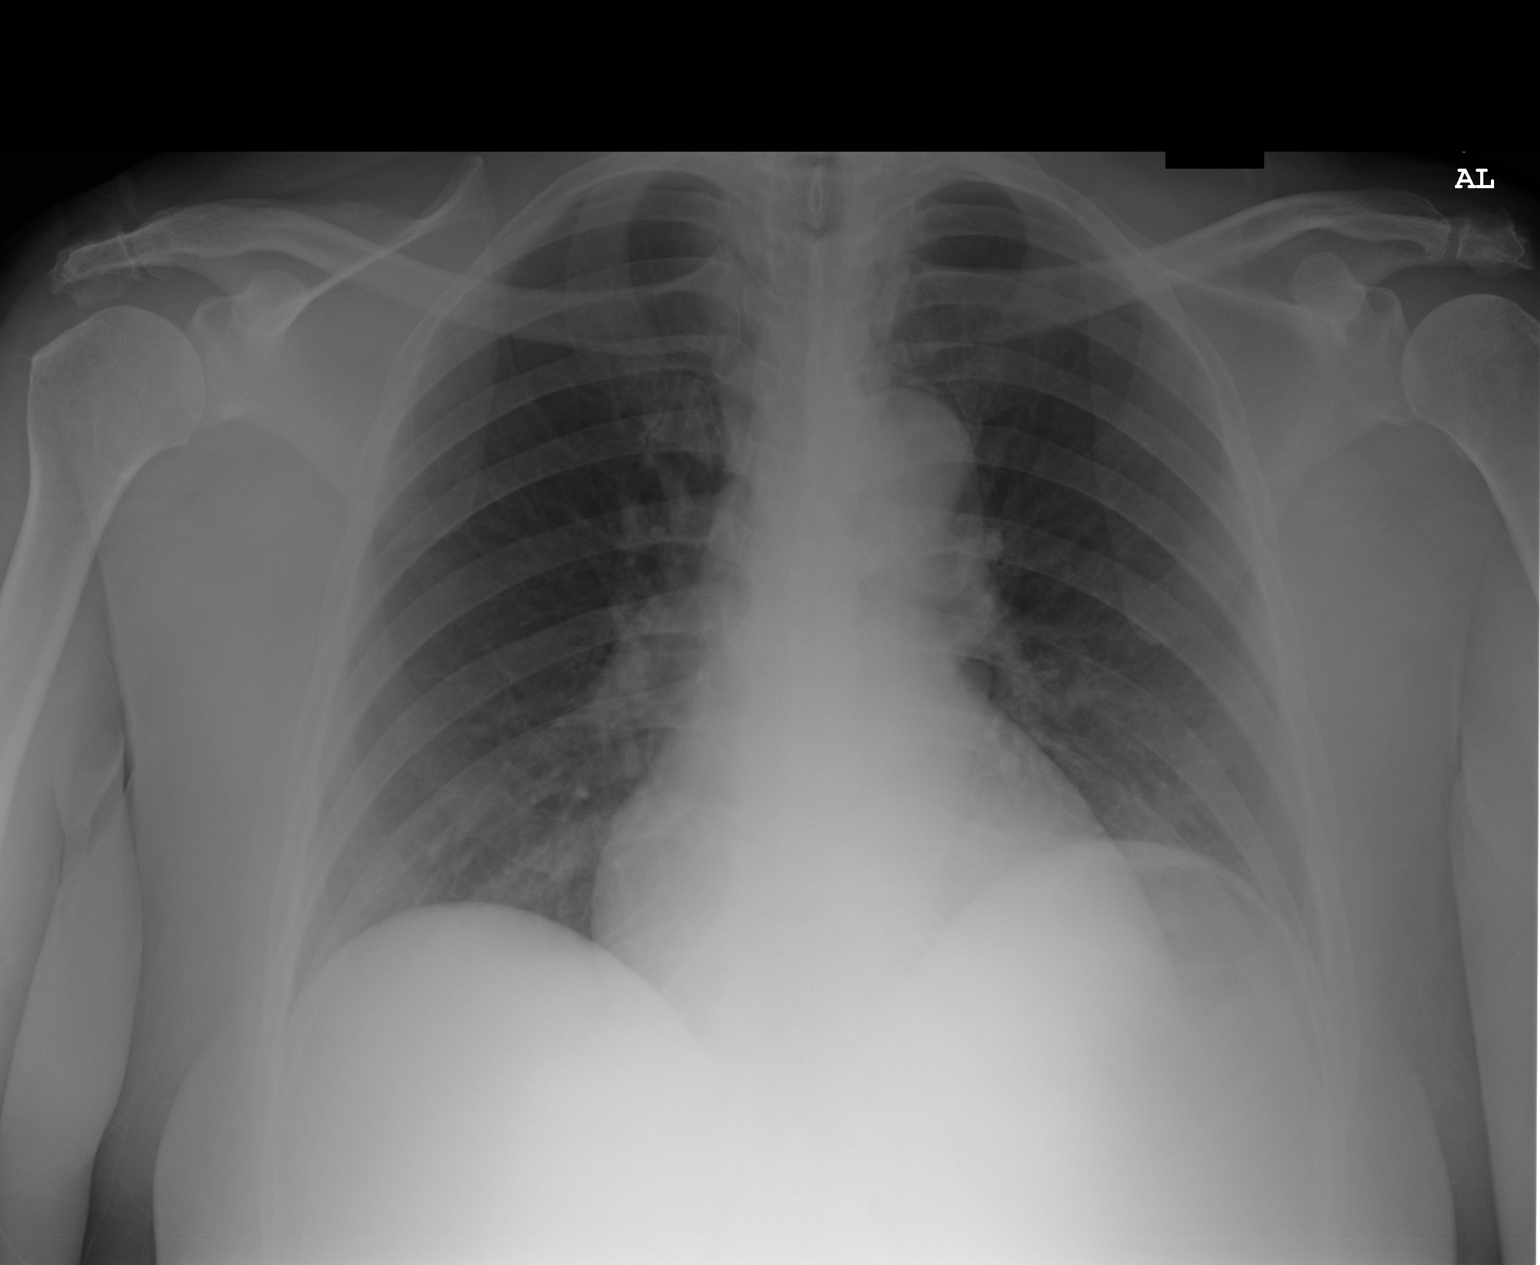

[1 of 1 positions shown; findings below may reference images not displayed]

PROCEDURE:     DXR - DXR PORTABLE CHEST SINGLE VIEW  - [DATE] [DATE]

RESULT:     The left hemidiaphragm is slightly higher than the right. The
lungs are reasonably well inflated. The cardiac silhouette is top normal in
size. The perihilar lung markings are minimally prominent. I see no pleural
effusion.
IMPRESSION: I see no evidence of pneumonia nor CHF. I cannot exclude
minimal subsegmental atelectasis in the perihilar regions. A followup PA and
lateral chest x-ray would be of value if the patient is having any
cardiopulmonary symptoms.

## 2010-03-05 IMAGING — CT CT CHEST-ABD W/ CM
1 of 2 series · 15 of 32 positions shown, 19 images · non-contrast
Comparison: none

REASON FOR EXAM: (1) chest pain intermittent; (2) epigastric pain aaa
repair
COMMENTS:

PROCEDURE:     CT  - CT CHEST AND ABDOMEN W  - [DATE] [DATE]
RESULT:
HISTORY: Chest pain.
COMPARISON STUDY:    CT abdomen and pelvis of [DATE].

[Series 4: soft tissue · axial · 0.75mm/px · z∈[+168,+600]mm · 15 of 160 slices shown, 19 images]
[im 8/160  mediastinal]
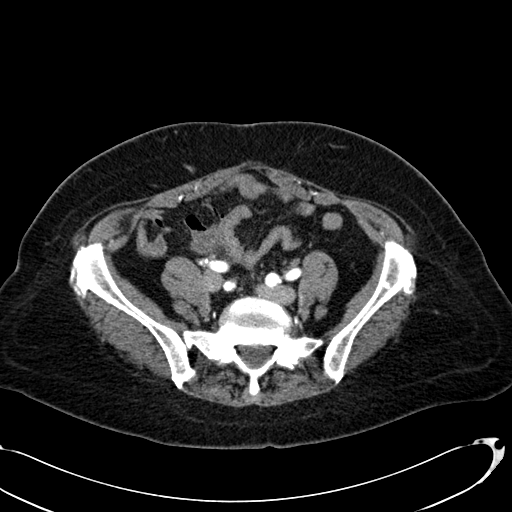
[im 8/160  bone]
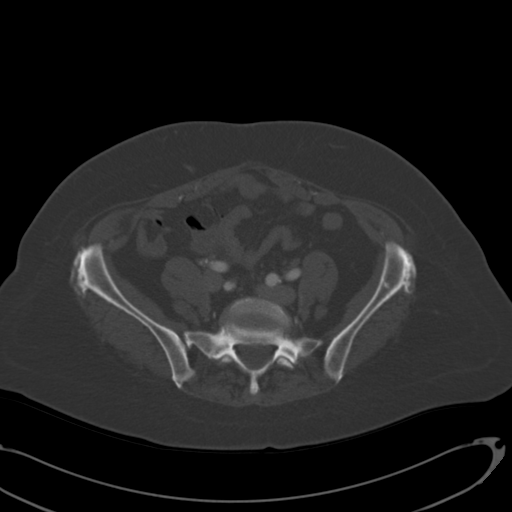
[im 24/160  mediastinal]
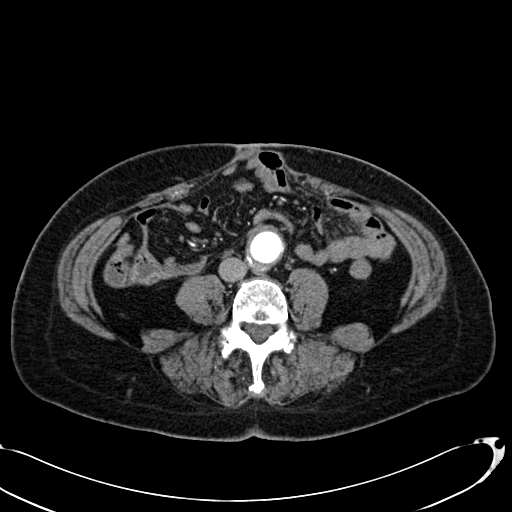
[im 40/160  mediastinal]
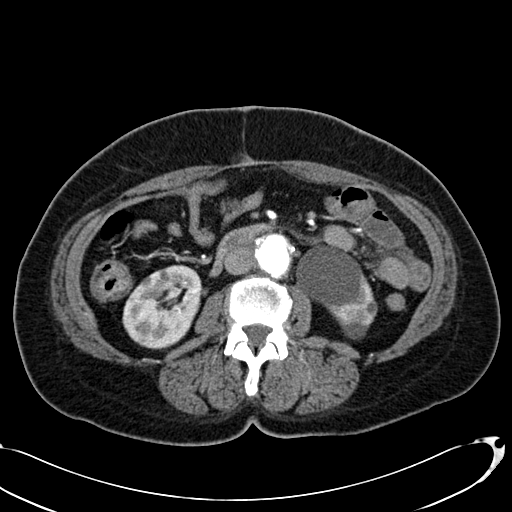
[im 48/160  mediastinal]
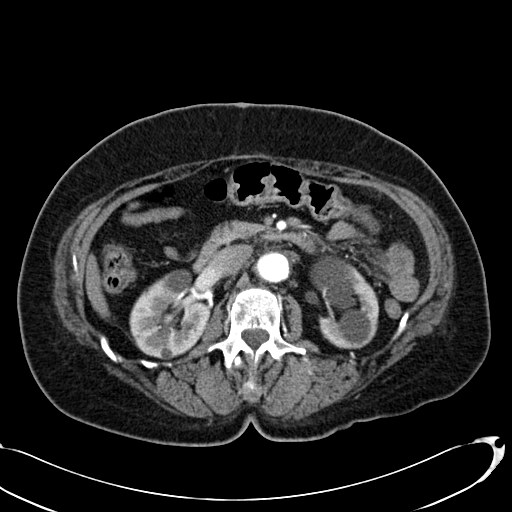
[im 56/160  mediastinal]
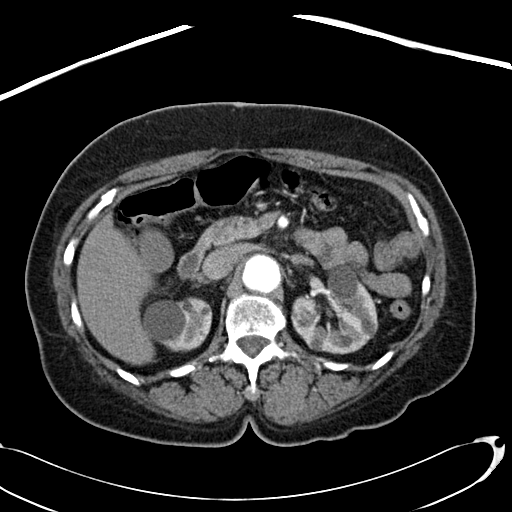
[im 72/160  mediastinal]
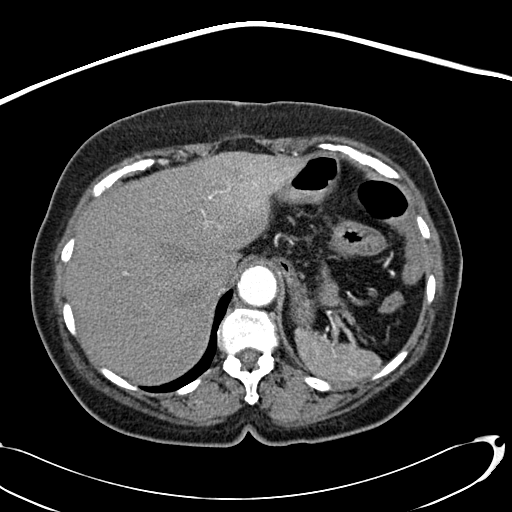
[im 80/160  mediastinal]
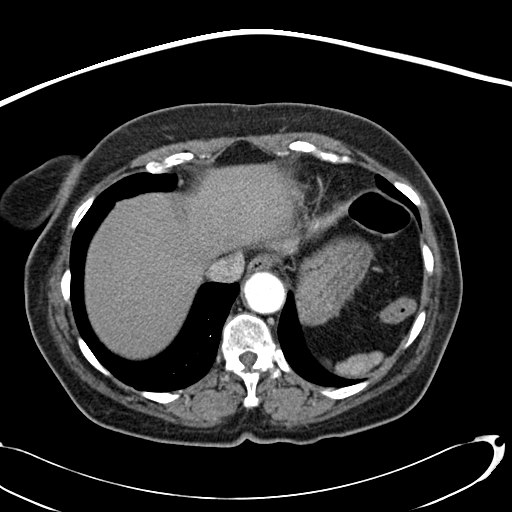
[im 88/160  mediastinal]
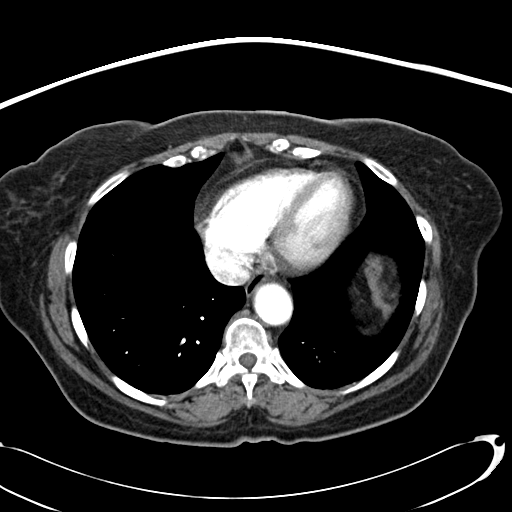
[im 104/160  mediastinal]
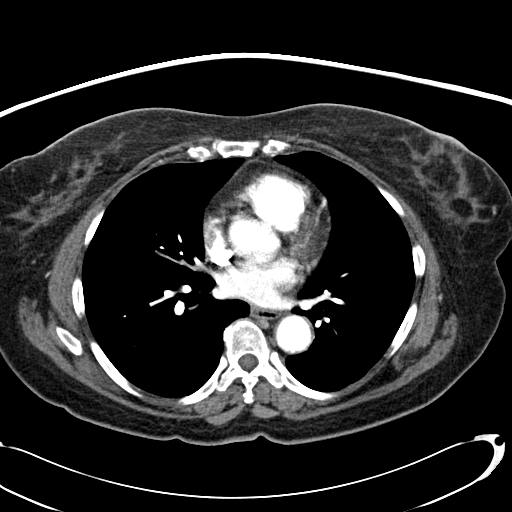
[im 104/160  bone]
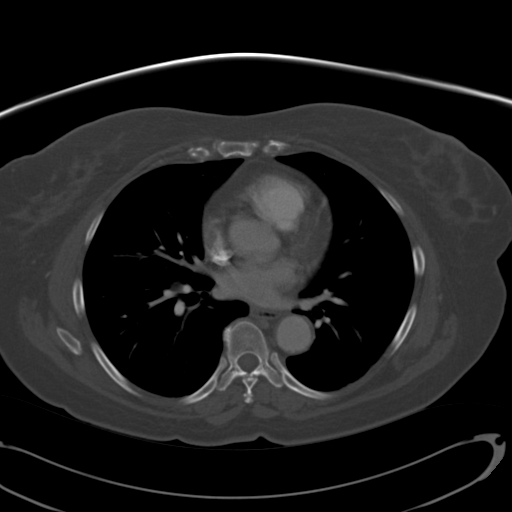
[im 112/160  mediastinal]
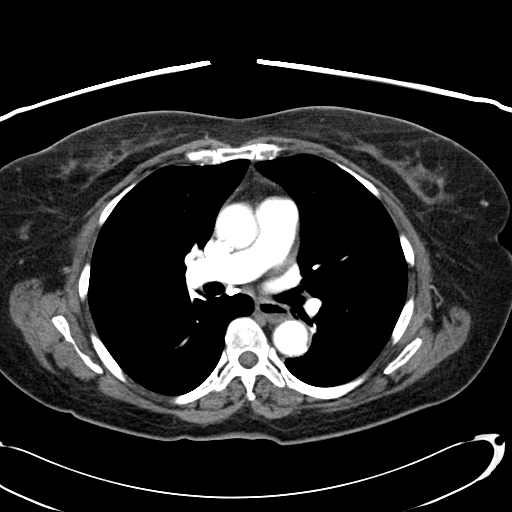
[im 120/160  mediastinal]
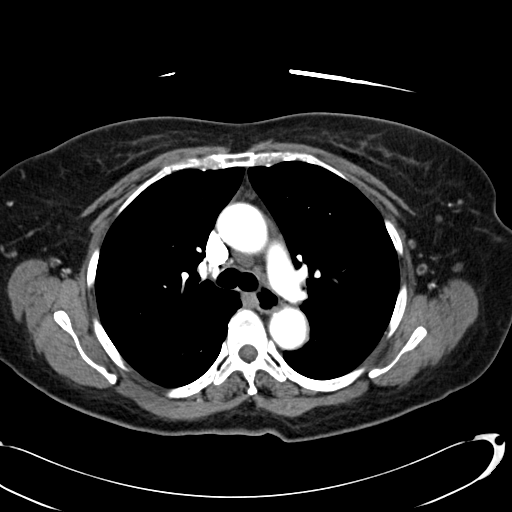
[im 128/160  lung]
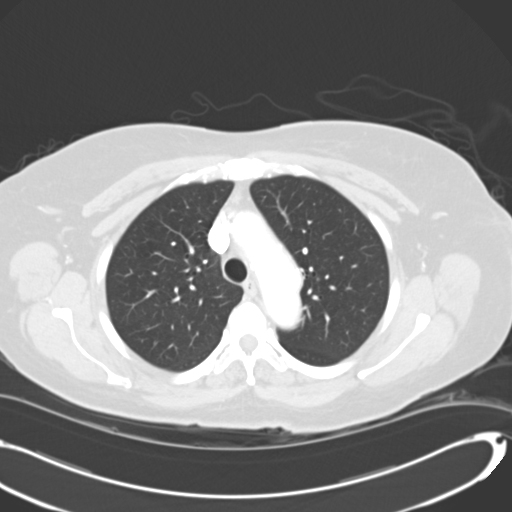
[im 136/160  mediastinal]
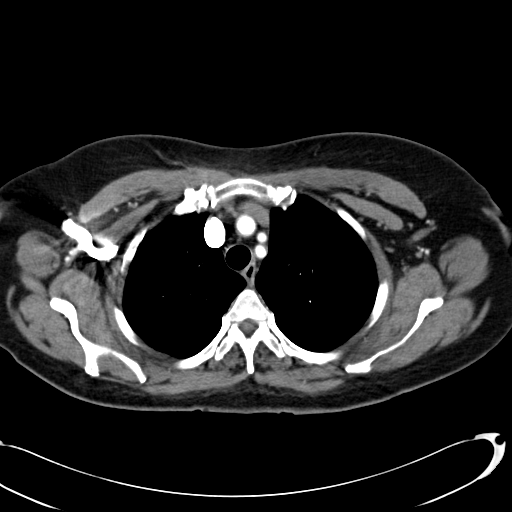
[im 136/160  lung]
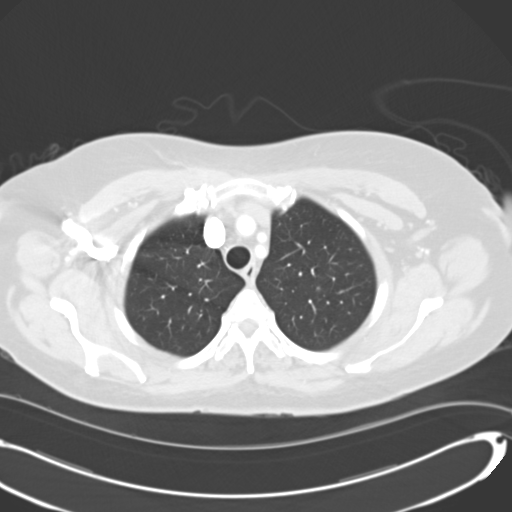
[im 144/160  lung]
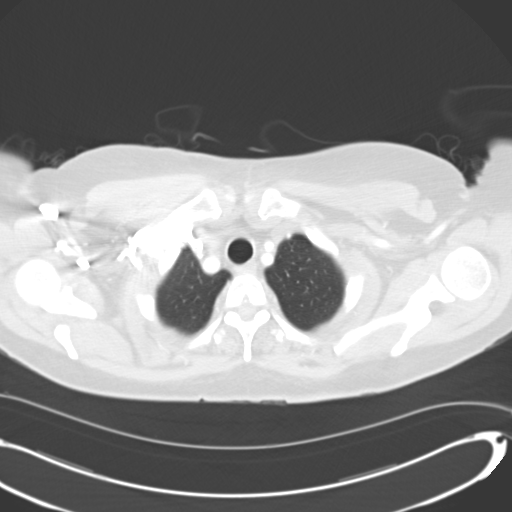
[im 152/160  mediastinal]
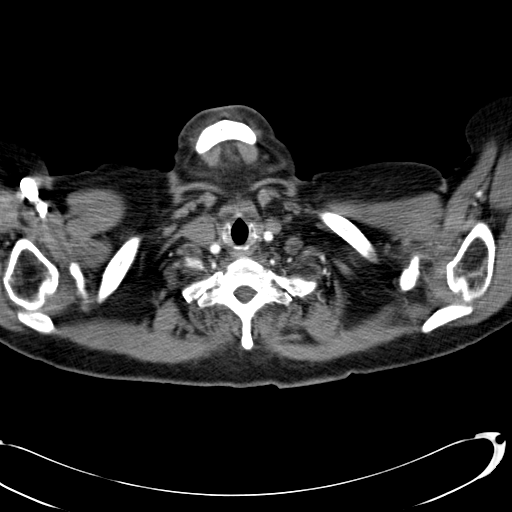
[im 152/160  lung]
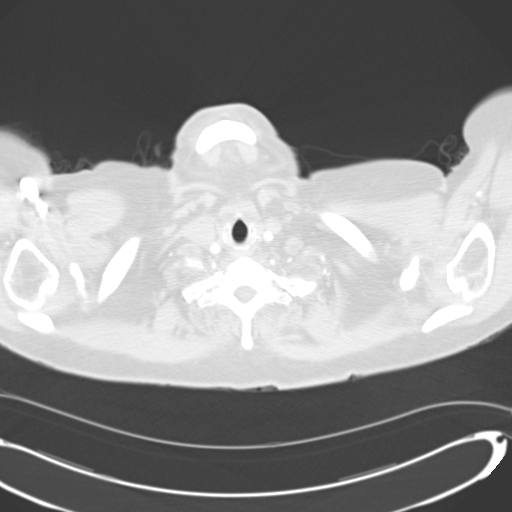

[15 of 32 positions shown; findings below may reference images not displayed]

FINDINGS: Following administration 100 mL of [02], CT was obtained.
Thoracic aorta demonstrates a small arch aneurysm versus ulceration.
Pulmonary arteries are normal. Large airways are present. Lungs are clear.
No focal pulmonary infiltrates. Adrenals are normal. Bilaterals renal cysts
are present. Liver is normal. Spleen is normal. Pancreas is normal. There is
no bowel distention. Right lower quadrant is unremarkable. Pelvis is
unremarkable. Surgical clips are noted about the abdominal aorta. This is
consistent prior aneurysm repair. An ulceration versus contained leak along
the posterior repair just above the bifurcation cannot be completely
excluded. Consultation with vascular surgery should be considered. A midline
abdominal hernia is noted with herniation of bowel appears. There is no
evidence of bowel obstruction. No free air noted.
IMPRESSION: 1. Thoracic aortic arch small focal aneurysm versus ulceration. The patient
has had a prior abdominal aortic aneurysm repair. Focal ulceration versus
subtle leak noted along the posterior aspect of the lower abdominal aortic
aneurysm just above the aortic bifurcation. Vascular surgical evaluation
should be considered in this patient. No evidence of hemorrhage.
2. No evidence of pulmonary embolus.
3. Bilateral multiple renal cyst.
4. Ventral hernia with herniation of bowel. No evidence of bowel obstruction.

## 2011-02-02 NOTE — Assessment & Plan Note (Signed)
OFFICE VISIT   Betty Nielsen, Betty Nielsen  DOB:  Aug 14, 1946                                       05/07/2008  CHART#:[id number]   I saw the patient in the office today for followup after her recent  repair of her abdominal aortic aneurysm.  This is a 65 year old woman  who I have been following with an abdominal aortic aneurysm which had  enlarged to 5.7 cm.  She underwent repair of the aneurysm with a 16 mm  tube graft and repair of an umbilical hernia on [id number].  She returns  for her routine followup visit.   She has no specific complaints and overall has been doing quite well.  Her appetite is returning to normal.  She had been gradually resuming  her normal activities.   PHYSICAL EXAMINATION:  Vital signs:  On examination her blood pressure  is 126/74, heart rate is 81.  Lungs:  Clear bilaterally to auscultation.  Cardiac:  She has a regular rate and rhythm.  Her incision is healing  nicely.  We removed her staples in the office today.  She has palpable  femoral pulses.  She has triphasic waveforms in both feet with an ABI of  100% bilaterally.   Overall, I am pleased with her progress and I will see her back in 6  months.  We have discussed the importance of receiving prophylactic  antibiotics for any invasive procedures.   Judeth Cornfield. Scot Dock, M.D.  Electronically Signed   CSD/MEDQ  D:  05/07/2008  T:  05/08/2008  Job:  [id number]

## 2011-02-02 NOTE — Procedures (Signed)
DUPLEX ULTRASOUND OF ABDOMINAL AORTA   INDICATION:  Follow up abdominal aortic aneurysm.   HISTORY:  Diabetes:  No.  Cardiac:  No.  Hypertension:  Yes.  Smoking:  Quit in 2001.  Connective Tissue Disorder:  Family History:  Previous Surgery:   DUPLEX EXAM:         AP (cm)                   TRANSVERSE (cm)  Proximal             3.11 cm                   3.10 cm  Mid                  4.91 cm                   4.88 cm  Distal               2.74 cm                   2.65 cm  Right Iliac          Not well visualized  Left Iliac           Not well visualized   PREVIOUS:  Date:  AP:  4.34  TRANSVERSE:  4.08   IMPRESSION:  Abdominal aortic aneurysm noted with the largest  measurement of 4.91 cm X 4.88 cm.   ___________________________________________  Judeth Cornfield. Scot Dock, M.D.   MG/MEDQ  D:  08/29/2007  T:  08/30/2007  Job:  [id number]

## 2011-02-02 NOTE — Assessment & Plan Note (Signed)
OFFICE VISIT   PERDITA, NEILL  DOB:  08-27-1946                                       01/22/2010  CHART#:[id number]   I saw the patient in the office today for continued followup of her  ventral hernia.  She had repair of an infrarenal abdominal aortic  aneurysm and repair of an umbilical hernia in July 2009.  She developed  a small ventral hernia above her umbilical hernia.  When I last saw her  in May 2010, she was asymptomatic and we were simply following this.  I  did offer to have her seen by the general surgeons to consider repair if  she wished; however, she was comfortable with [place] following it here for  now.   Since I saw her last she has had some problems with reflux and  occasional problems with constipation.  She has had no nausea or  vomiting.  She has had no significant pain associated with her hernia.  The hernia has always been easily reducible.   PAST MEDICAL HISTORY:  Significant for hypertension and  hypercholesterolemia, both of which have been stable on her current  medications.  She has had no previous history of myocardial infarction,  history of congestive heart failure or history of COPD.   SOCIAL HISTORY:  He is widowed.  She has 1 child.  She quit tobacco in  1996.   REVIEW OF SYSTEMS:  CARDIOVASCULAR:  She had no chest pain, chest  pressure, palpitations or arrhythmias.  She has had no claudication,  rest pain or nonhealing ulcers.  She has had no history of stroke, TIAs.  She has had no history of DVT or phlebitis.  PULMONARY:  She has had no productive cough, bronchitis, asthma or  wheezing.  MUSCULOSKELETAL:  She does have a history of arthritis.   PHYSICAL EXAMINATION:  This is a pleasant 65 year old woman who appears  her stated age.  Blood pressure is 122/79, heart rate is 77, temperature  is 98.1.  Lungs are clear bilaterally to auscultation without rales,  rhonchi or wheezing.  Cardiovascular exam:  She has a  regular rate and  rhythm without murmur.  She has palpable femoral pulses and warm, well-  perfused feet.  Her abdomen is soft and nontender with a small ventral  hernia which is about 4-5 cm in diameter.  This is easily reducible.  Neurologic exam is nonfocal.   I have again offered to have her evaluated by the general surgeons if  she wanted to have a hernia fixed; however, currently I do not see any  significant risk of incarceration and do not think that it has been  symptomatic.  For this reason she is agreeable to simply continue to  follow this for now.  I will plan on seeing her back in 6 months.  She  knows to call sooner if she has problems.     Judeth Cornfield. Scot Dock, M.D.  Electronically Signed   CSD/MEDQ  D:  01/22/2010  T:  01/23/2010  Job:  [id number]

## 2011-02-02 NOTE — Op Note (Signed)
NAME:  Betty Nielsen, KOGLER NO.:  [id number]   MEDICAL RECORD NO.:  [id number]          PATIENT TYPE:  INP   LOCATION:  2307                         FACILITY:  [facility]   PHYSICIAN:  Judeth Cornfield. Scot Dock, M.D.DATE OF BIRTH:  1946-02-20   DATE OF PROCEDURE:  04/18/2008  DATE OF DISCHARGE:                               OPERATIVE REPORT   PREOPERATIVE DIAGNOSIS:  A 5.7-infrarenal abdominal aortic aneurysm.   POSTOPERATIVE DIAGNOSIS:  A 5.7-infrarenal abdominal aortic aneurysm.   PROCEDURES:  1. Repair of abdominal aortic aneurysm with 16-mm tube graft.  2. Repair of umbilical hernia.   SURGEON:  Judeth Cornfield. Scot Dock, MD   ASSISTANTS:  1. Nelda Severe. Kellie Simmering, MD  2. Jacinta Shoe, PA   ANESTHESIA:  General.   INDICATIONS:  This is a pleasant 65 year old woman who I have been  following with an abdominal aortic aneurysm.  This has enlarged from 4.2  initially in 2005 to 5.7 by the radiologist interpretation on her most  recent CAT scan.  Given the enlargement of the aneurysm to greater than  5.5 cm, elective repair was recommended because of the 5-10% year risk  of rupture.  The procedure and potential complications were discussed  with the patient preoperatively.  She was not felt to be a good  candidate for endovascular repair because of some tortuosity of the  neck, which might prevent a good proximal attachment.   TECHNIQUE:  The patient was taken to the operating room, received a  general anesthetic.  Arterial line and Swan-Ganz catheter had been  placed by Anesthesia.  The abdomen was prepped and draped in the usual  sterile fashion as were both groins.  The abdomen was entered through a  midline incision, and upon careful exploration, no other intra-abdominal  pathology was noted except for the large infrarenal abdominal aortic  aneurysm.  The transverse colon was reflected superiorly and the small  bowel reflected to the right.  The retroperitoneal  tissue was divided  and the aneurysm controlled, and the dissection carried up to the level  of the renal vein.  There was significant tortuosity of the neck  proximally, I was able to get around the neck circumferentially.  Dissection was then carried down to the bifurcation and both common  iliac arteries were dissected free and controlled circumferentially.  Next, the patient received 8000 units of IV heparin and 25 g of  mannitol.  The infrarenal aorta was then clamped.  The common iliac  arteries were clamped.  The aneurysm was opened and teased off  proximally and distally.  There was significant backbleeding from the  lumbars, which were oversewn with 2-0 silk sutures.  Once hemostasis was  of control, I completely divided the proximal neck.  Using a felt cuff,  the 16-mm graft was sewn end-to-end to the infrarenal aorta using  continuous 3-0 Prolene suture.  After completing the anastomosis, the  graft was flushed and all debris washed out and then the proximal  anastomosis was hemostatic.  The aorta above the anastomosis was  clamped.  The graft was then cut to the  appropriate length for  anastomosis to the distal aorta just above the bifurcation.  This  anastomosis was sewn end-to-end with continuous 3-0 Prolene suture.  Prior to completing this anastomosis, the graft was flushed, the  arteries backbled and flushed appropriately, and the anastomosis  completed.  Flow was then reestablished to the legs, which the patient  tolerated from a hemodynamic standpoint.  Next, the hemostasis was  obtained, and the heparin was partially reversed with protamine.  The  aneurysm was closed over the graft with running 2-0 Vicryl.  The  retroperitoneal tissue was closed with running 2-0 Vicryl.  The  abdominal contents returned to their normal position, and the fascial  layer was then closed with two #1 PDS sutures.  The subcutaneous tissue  was closed with two 3-0 Vicryls, and the skin was  closed with staples.  The patient tolerated the procedure well and remained hemodynamically  stable.  The patient had palpable femoral and pedal pulses  postoperatively.      Judeth Cornfield. Scot Dock, M.D.  Electronically Signed     CSD/MEDQ  D:  04/18/2008  T:  04/19/2008  Job:  [id number]

## 2011-02-02 NOTE — Assessment & Plan Note (Signed)
OFFICE VISIT   [name], [name]  DOB:  April 02, 1946                                       03/08/2007  CHART#:[id number]   REASON FOR VISIT:  I saw the patient in the office today for continued  followup of her abdominal aortic aneurysm.   HISTORY OF PRESENT ILLNESS:  Since I saw her last in January of this  year, she has had no significant abdominal or back pain. At that time  the maximum diameter was 4.6 cm. She comes in today for a routine follow-  up visit after having had a CT scan today. She has had no abdominal or  back pain.   PAST MEDICAL HISTORY:  There has been no significant change in her  medical history. She has a history of hypertension and  hypercholesterolemia. She has had no previous heart problems.   REVIEW OF SYSTEMS:  On review of systems she has had no recent chest  pain, chest pressure, palpitations or arrhythmias. She has had no  bronchitis, asthma or wheezing.   PHYSICAL EXAMINATION:  VITAL SIGNS: On physical examination blood  pressure is 151/87, heart rate is 87.  NECK: I did not detect any carotid bruits.  LUNGS: The lungs are clear bilaterally to auscultation.  CARDIAC: On cardiac exam she has a regular rate and rhythm.  ABDOMEN: The abdomen was soft and nontender. She is slightly obese. She  has palpable femoral pulses.  EXTREMITIES: Warm and well-perfused feet.   LABORATORY DATA:  On review of her CT scan, the aneurysm was enlarged to  4.9 cm.   IMPRESSION:  I again explained we generally would not consider elective  repair unless the aneurysm enlarged to 5.5 cm. I plan on seeing her back  in 6 months with a follow-up ultrasound. She seems quite nervous about  her aneurysm and I think if the aneurysm continues to enlarge gradually,  we should consider elective repair if it is significantly greater than 6  cm. Fortunately, she is not a smoker currently. It looks like she could  potentially be a candidate for an endovascular  repair of her aneurysm  although she is quite young.   Betty Nielsen. Betty Nielsen, M.D.  Electronically Signed   CSD/MEDQ  D:  03/08/2007  T:  03/09/2007  Job:  [id number]   cc:   Gaylyn Cheers

## 2011-02-02 NOTE — Op Note (Signed)
NAME:  Betty Nielsen, Betty Nielsen               ACCOUNT NO.:  [id number]   MEDICAL RECORD NO.:  [id number]          PATIENT TYPE:  AMB   LOCATION:  SDS                          FACILITY:  [facility]   PHYSICIAN:  Judeth Cornfield. Scot Dock, M.D.DATE OF BIRTH:  08-09-1946   DATE OF PROCEDURE:  03/18/2008  DATE OF DISCHARGE:  03/18/2008                               OPERATIVE REPORT   PREOPERATIVE DIAGNOSIS:  Abdominal aortic aneurysm.   POSTOPERATIVE DIAGNOSIS:  Abdominal aortic aneurysm.   PROCEDURE:  1. Ultrasound-guided access of the right common femoral artery.  2. Aortogram with bilateral iliac arteriogram and bilateral lower      extremity runoff.   INDICATIONS:  This is a pleasant 65 year old woman who I have been  following with an abdominal aortic aneurysm.  She had some ectasia of  the suprarenal aorta, however, the aorta tapered down at the level of  the renal arteries.  She then had a short neck and then a aneurysm which  enlarged to 5.5 cm based on a CAT scan.  She was brought in for  diagnostic arteriography to evaluate her aneurysm for elective repair  and consider endovascular repair.  The procedure and potential  complications had been discussed with the patient preoperatively.  All  of her questions were answered and she was agreeable to proceed.   TECHNIQUE:  The patient was taken to the PV lab and sedated with 1 mg of  Versed and 25 mcg of fentanyl.  Both groins were prepped and draped in  usual sterile fashion.  After the skin was anesthetized with 1%  lidocaine under ultrasound guidance, the right common femoral artery was  cannulated and a guidewire introduced into the infrarenal aorta under  fluoroscopic control.  A 5-French sheath was introduced over the wire  and then the dilator was removed.  Pigtail catheter was positioned at  the L1 vertebral body and flush aortogram obtained.  A lateral  projection was then obtained.  The catheter was then brought down above  the aortic  bifurcation and oblique iliac projections were obtained and  bilateral lower extremity runoff films were obtained.   FINDINGS:  There are single renal arteries bilaterally.  There was an  approximately 80% left renal artery stenosis.  No significant renal  artery stenosis was identified on the right.  The aneurysm begins below  the level of the renal arteries and the size cannot accurately be  determined by this study.  The aneurysm appears to end at the aortic  bifurcation.  Of note, the suprarenal aorta is ectatic but not frankly  aneurysmal.  The common iliac, external iliac, and hypogastric arteries  are patent bilaterally.  The common femoral, deep femoral, superficial  femoral, popliteal, and tibial vessels are patent bilaterally.  There is  3-vessel runoff bilaterally via the anterior tibial, posterior tibial,  and peroneal arteries.   CONCLUSIONS:  1. 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  2. Infrarenal abdominal aortic aneurysm which begins below the level      of the renal arteries.  The neck, however, is quite tortuous      proximally.  Judeth Cornfield. Scot Dock, M.D.  Electronically Signed     CSD/MEDQ  D:  03/18/2008  T:  03/18/2008  Job:  [id number]

## 2011-02-02 NOTE — Assessment & Plan Note (Signed)
OFFICE VISIT   Betty Nielsen, Betty Nielsen  DOB:  15-Sep-1946                                       02/11/2009  CHART#:[id number]   I saw the patient in the office today for continued followup of her  ventral hernia.  She underwent repair of an abdominal aortic aneurysm in  July of 2009 and also repair of an umbilical hernia.  When I last saw  her in February of 2010 she had a small ventral hernia.  She comes in  for a 3 month followup visit.  Since I saw her last she does  occasionally experiencing some pain with the hernia when she is bloated.  She has had no nausea or vomiting and no change in her bowel habits.   REVIEW OF SYSTEMS:  She has had no fever or chills.  She has had no  chest pain, chest pressure, palpitations or arrhythmias.   PHYSICAL EXAMINATION:  Blood pressure is 142/82, heart rate is 92.  Lungs are clear bilaterally to auscultation.  On cardiac exam she has a  regular rate and rhythm.  Her abdomen is soft and nontender.  She has a  small ventral hernia.  She has normal pitched bowel sounds.  She has  palpable pedal pulses.   As her hernia is associated with minimal symptoms I have recommend we  simply follow this for now.  If it enlarges or she develops significant  symptoms consideration could be given to elective repair.  If she does  elect to have repair I think I will have her evaluated by the general  surgeons to see if there is any new laparoscopic techniques which might  give her a better long-term success rate with a lower recurrence of  recurrent hernia.  I will see her back in 6 months.  She knows to call  sooner if she has problems.   Judeth Cornfield. Scot Dock, M.D.  Electronically Signed   CSD/MEDQ  D:  02/11/2009  T:  02/12/2009  Job:  [id number]

## 2011-02-02 NOTE — Assessment & Plan Note (Signed)
OFFICE VISIT   Betty Nielsen, Betty Nielsen  DOB:  August 02, 1946                                       11/12/2008  CHART#:[id number]   I saw the patient in the office today for routine followup visit.  She  had repair of an abdominal aortic aneurysm and repair of an umbilical  hernia in July of 2009.  She had a 5.7 cm infrarenal abdominal aortic  aneurysm.  She is now approximately 7 months postop and she presents  today with the complaint of some pain adjacent to her umbilicus where  she has had a small hernia.  She has noticed a bulge in this area which  is easily compressible when she lies down.  She has had no fever or  chills.  She has had no nausea or vomiting.   REVIEW OF SYSTEMS:  She has had no chest pain, chest pressure,  palpitations or arrhythmias.   PHYSICAL EXAMINATION:  General:  This is a pleasant 65 year old woman  who appears her stated age.  Vital signs:  Her blood pressure is 120/70,  heart rate is 70.  Lungs:  Clear bilaterally to auscultation.  Cardiac:  She has a regular rate and rhythm.  Abdomen:  Soft and nontender.  She  has normal pitched bowel sounds.  She has a small hernia about 2 cm in  diameter adjacent to her umbilicus.  She has palpable femoral pulses and  warm, well-perfused feet without significant lower extremity swelling.   We have discussed potentially repairing her small ventral hernia with  mesh.  I think it is very low risk for obstructing, however, it has been  tender for her.  She is somewhat reluctant to proceed with repair and  therefore we will plan on seeing her back in 3 months unless she calls  sooner.   Judeth Cornfield. Scot Dock, M.D.  Electronically Signed   CSD/MEDQ  D:  11/12/2008  T:  11/13/2008  Job:  [id number]

## 2011-02-02 NOTE — Procedures (Signed)
LOWER EXTREMITY ARTERIAL EVALUATION-SINGLE LEVEL   INDICATION:  Follow-up evaluation of AAA repair.   HISTORY:  Diabetes:  No.  Cardiac:  No.  Hypertension:  Yes.  Smoking:  No.  Previous Surgery:  AAA repair on 04/18/2008 by Dr. Scot Dock.   RESTING SYSTOLIC PRESSURES: (ABI)                          RIGHT                LEFT  Brachial:               119                  120  Anterior tibial:        122                  136 (1.13)  Posterior tibial:       138 (1.15)           132  Peroneal:  DOPPLER WAVEFORM ANALYSIS:  Anterior tibial:        Triphasic            Triphasic  Posterior tibial:       Biphasic             Triphasic  Peroneal:   PREVIOUS ABI'S:  Date:  RIGHT:  LEFT:   IMPRESSION:  Normal ABIs bilaterally status post AAA repair.   ___________________________________________  Judeth Cornfield. Scot Dock, M.D.   PB/MEDQ  D:  05/07/2008  T:  05/07/2008  Job:  [id number]

## 2011-02-02 NOTE — Discharge Summary (Signed)
NAMEMARRION, BOYD NO.:  [id number]   MEDICAL RECORD NO.:  [id number]          PATIENT TYPE:  INP   LOCATION:  2039                         FACILITY:  [facility]   PHYSICIAN:  Judeth Cornfield. Scot Dock, M.D.DATE OF BIRTH:  22-Aug-1946   DATE OF ADMISSION:  04/18/2008  DATE OF DISCHARGE:  04/23/2008                               DISCHARGE SUMMARY   DISCHARGE DIAGNOSES:  1. Abdominal aortic aneurysm.  2. Hypertension.  3. Hyperthyroidism.  4. Arthritis.  5. History of anxiety.   PROCEDURE PERFORMED:  On April 18, 2008, open repair of abdominal aortic  aneurysm using a 16-mm Hemashield tube graft and __________ repair of  umbilical hernia by Dr. Scot Dock.   COMPLICATIONS:  None.   DISCHARGE MEDICATIONS:  1. Nu-Iron 150 mg p.o. b.i.d.  2. Percocet 5/325 one p.o. q.4 h p.r.n. pain.  3. Benazepril and hydrochlorothiazide 20/12.5 p.o. daily.  4. Synthroid 75 mcg p.o. daily.  5. Multivitamin 1 p.o. daily.   DISPOSITION:  She is being discharged home in stable condition with her  wounds healing well.  She is instructed to clean the wounds with soap  and water.  She is to see Dr. Scot Dock in 2-3 weeks with ADIs and staple  removal for followup.   BRIEF IDENTIFYING STATEMENT:  For complete details, please refer to  typed history and physical.  Briefly, this very pleasant 65 year old  woman was evaluated by Dr. Scot Dock for an abdominal aortic aneurysm.  He  felt that she should undergo repair.  She was informed of the risks and  benefits of the procedure and after careful consideration, elected to  proceed with surgery.   HOSPITAL COURSE:  Preoperative workup was completed as an outpatient.  She was brought in through same-day surgery and underwent the  aforementioned abdominal aortic aneurysm repair.  For complete details,  please refer to the typed operative report.  The procedure was without  complication.  She was returned to the intensive care unit in critical,  but stable condition.  She was extubated. She was observed overnight.  The following day, she was transferred to a bed on a surgical  convalescent floor.   Postoperatively, she displayed steady improvement.  Her diet was  advanced as tolerated.  She was walking and improving with physical  therapy.  On April 23, 2008, she was desirous of discharge.  She was  stable and tolerating solid foods and was discharged home.      Chad Cordial, PA      Judeth Cornfield. Scot Dock, M.D.  Electronically Signed    KEL/MEDQ  D:  04/23/2008  T:  04/23/2008  Job:  [id number]

## 2011-02-02 NOTE — Assessment & Plan Note (Signed)
OFFICE VISIT   Betty Nielsen, Betty Nielsen  DOB:  August 01, 1946                                       08/29/2007  CHART#:[id number]   I saw the patient in the office today for continued followup of her  abdominal aortic aneurysm.  Since I saw her last in June, she has had no  significant abdominal or back pain.  There has been no significant  change in her medical history.   SOCIAL HISTORY:  Fortunately, she is not a smoker.   REVIEW OF SYSTEMS:  She has had no recent chest pain, chest pressure,  palpitations, or arrhythmias.  She has had no bronchitis, asthma, or wheezing.  She has no significant claudication or rest pain.   PHYSICAL EXAMINATION:  This is a pleasant 65 year old woman who appears  her stated age.  Her blood pressure is 166/86, heart rate is 75.  I did  not detect any carotid bruits.  Lungs are clear bilaterally to  auscultation.  On cardiac exam, she has a regular rate and rhythm.  Her  abdomen is soft and nontender.  Her aneurysm is palpable and nontender.  She has palpable femoral, popliteal, and posterior tibial pulses  bilaterally.  Ultrasound today shows the maximum diameter of her  aneurysm is 4.9 cm and, thus, has not changed compared to the CT scan  back in June.   She understands that we would generally not consider elective repair of  the aneurysm unless it reached 5.5 cm in maximum diameter.  We will see  her back in 6 months with a followup CT.  She knows to call sooner if  she has problems.   Judeth Cornfield. Scot Dock, M.D.  Electronically Signed   CSD/MEDQ  D:  08/29/2007  T:  08/30/2007  Job:  [id number]   cc:   Gaylyn Cheers

## 2011-02-02 NOTE — H&P (Signed)
HISTORY AND PHYSICAL EXAMINATION   April 09, 2008   Re:  Betty Nielsen, Betty Nielsen                 DOB:  01-20-1946   REASON FOR ADMISSION:  A 5.5 cm infrarenal abdominal aortic aneurysm.   HISTORY:  This is a pleasant 65 year old woman whom I have been  following with an abdominal aortic aneurysm.  I had originally seen her  in June of 2005 at which time the aneurysm measured 4.2 cm in maximum  diameter.  On her most recent followup CT scan in June of this year the  aneurysm had enlarged to 5.5 cm in maximum diameter.  Given the  enlargement and now the size of 5.5 cm, elective repair was recommended  in order to prevent rupture.   Of note, she has undergone a preoperative Cardiolite which was done on  03/15/2008 and showed normal contractility and thickening in all areas  of myocardium, overall LV function was normal.  Ejection fraction was  70%.  This was a normal stress nuclear test.   PAST MEDICAL HISTORY:  Significant for hypertension and  hypercholesterolemia.  She denies any history of diabetes, history of  previous myocardial infarction, or history of congestive heart failure.  She has no history of COPD.   FAMILY HISTORY:  She is unaware of any history of premature  cardiovascular disease or history of aneurysmal disease.   SOCIAL HISTORY:  She is married.  She has 1 child.  She quit tobacco in  2002.  She does not use alcohol on a regular basis.   MEDICATIONS:  1. Levothyroxine 75 mcg p.o. daily.  2. Benazepril/hydrochlorothiazide 20/12.5 one p.o. daily.   ALLERGIES:  Sulfa causes itching.   REVIEW OF SYSTEMS:  GENERAL:  She has had no recent weight loss, weight  gain or problems with her appetite.  CARDIAC:  She has had no recent chest pain, chest pressure, palpitations  or arrhythmias.  PULMONARY:  She has had no productive cough, bronchitis, asthma or  wheezing.  GI:  She has had no recent change in her bowel habits and has no history  of peptic  ulcer disease.  She does have a small umbilical hernia.  GU:  She has had no dysuria or frequency.  VASCULAR:  She has had no claudication, rest pain or nonhealing ulcers.  She has had no history of stroke, TIAs or history of DVT.  NEURO:  She has had no dizziness, blackouts, headaches or seizures.  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  She has had no rash.  HEMATOLOGIC:  She has had no bleeding problems or clotting disorders.   Given that the aneurysm has enlarged to 5.5 cm, I think her risk of  rupture is 5-10% per year.  For this reason, I have recommended elective  repair.  I do not think she is an ideal candidate for an endovascular  repair of her aneurysm, given the tortuosity of the neck.  The position  of the suprarenal aorta is somewhat dilated.  For this reason, I have  recommended open repair.  We have discussed the indications for the  procedure and the potential complications including but not limited to  bleeding, MI, wound healing problems, renal failure, graft infection.  All of her questions were answered.  She is agreeable to proceed.  Her  surgery has been scheduled for 04/18/2008.   Judeth Cornfield. Scot Dock, M.D.  Electronically Signed  CSD/MEDQ  D:  04/09/2008  T:  04/10/2008  Job:  [id number]

## 2011-02-02 NOTE — Assessment & Plan Note (Signed)
OFFICE VISIT   TENILE, ULERY  DOB:  1945-09-25                                       03/12/2008  CHART#:[id number]   I saw the patient in the office today for continued followup of her  abdominal aortic aneurysm.  I had originally seen her in June of 2005 at  which time her aneurysm measured 4.2 cm in maximum diameter.  She does  have some ectasia of the suprarenal aorta, however, the aorta tapers  down to the level of the renal arteries and there appears to be a short  neck below this.  There is some laminated thrombus at the neck of the  aneurysm and also some tortuosity.  I have been following her at 6 month  intervals and at her most recent followup study in December of 2008 the  maximum diameter of the aneurysm was 4.9 cm.  She comes in today with a  followup CT scan.   Since I saw her last she has had no significant abdominal or back pain.  There has been no significant change in her medical history.  She has a  history of hypertension and hypercholesterolemia.  She denies any  history of diabetes, history of myocardial infarction or history of  congestive heart failure.   REVIEW OF SYSTEMS:  CARDIOVASCULAR:  She has no recent chest pain, chest  pressure, palpitations or arrhythmias.  GU:  She does have some frequency of urination.  PULMONARY:  She has had no bronchitis, asthma or wheezing.  She is not a  smoker.   PHYSICAL EXAMINATION:  General:  This is a pleasant 65 year old woman  who appears her stated age.  Vital signs:  Blood pressure is 152/88,  heart rate is 72.  Neck:  I do not detect any carotid bruits.  Lungs:  Are clear bilaterally to auscultation.  Cardiac:  She has a regular rate  and rhythm.  Abdomen:  Is soft and nontender.  Her aneurysm is palpable.  She has an umbilical hernia.  She is moderately obese.  She has palpable  femoral pulses.  She has warm, well-perfused feet without evidence of  atheroembolic disease.   Given  that the aneurysm is now enlarged to approximately 5.5 cm  measurement I have recommended we consider elective repair.  Preoperatively she will need arteriography and the Cardiolite.  She  could potentially be a candidate for endovascular repair although I am  somewhat concerned mostly about the neck of the aneurysm which has some  significant tortuosity.  There also appears to be some laminated  thrombus at the neck of the aneurysm.  She is agreeable to proceed with  continued workup and evaluation for elective repair.  Once her  arteriogram and Cardiolite are complete we can discuss with her again  the options of endovascular versus open repair.  However, I suspect she  will likely require open repair pending the results of her arteriogram.   Judeth Cornfield. Scot Dock, M.D.  Electronically Signed   CSD/MEDQ  D:  03/12/2008  T:  03/13/2008  Job:  [id number]

## 2011-02-05 NOTE — Letter (Signed)
June 05, 2009   Insurance [employer]   Re:  Betty Nielsen, Betty Nielsen                 DOB:  06/05/1946   To Whom it May Concern:   Betty Nielsen is a pleasant 65 year old woman who I have been following  with an abdominal aortic aneurysm.  She underwent repair of her aneurysm  in July of 2009 and has done well since then.  I last saw her in May of  2010 and the only issue at that point was a small ventral hernia.   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  However, the aneurysm has  now been repaired and there is no further concern of aneurysmal disease.  She had open repair of the aneurysm and therefore no further followup of  the aneurysm is necessary as the aneurysm has been repaired.   If any further information is needed, I would be happy to provide this.   Sincerely,   Judeth Cornfield. Scot Dock, M.D.  Electronically Signed   CSD/MEDQ  D:  06/05/2009  T:  06/06/2009  Job:  [id number]

## 2011-06-17 LAB — POCT I-STAT, CHEM 8
Glucose, Bld: 90
Hemoglobin: 11.9 — ABNORMAL LOW
Potassium: 3.8

## 2011-06-18 LAB — COMPREHENSIVE METABOLIC PANEL
ALT: 13
ALT: 14
AST: 21
Alkaline Phosphatase: 64
BUN: 8
CO2: 24
CO2: 26
Calcium: 8.4
Chloride: 95 — ABNORMAL LOW
Chloride: 97
Creatinine, Ser: 1.06
GFR calc Af Amer: >60
GFR calc non Af Amer: 52 — ABNORMAL LOW
GFR calc non Af Amer: 53 — ABNORMAL LOW
Glucose, Bld: 140 — ABNORMAL HIGH
Glucose, Bld: 84
Potassium: 4.3
Sodium: 129 — ABNORMAL LOW
Total Bilirubin: 0.6
Total Bilirubin: 0.8
Total Protein: 6.7

## 2011-06-18 LAB — TYPE AND SCREEN
ABO/RH(D): AB POS
Antibody Screen: NEGATIVE

## 2011-06-18 LAB — CBC
HCT: 27.5 — ABNORMAL LOW
HCT: 33.6 — ABNORMAL LOW
HCT: 23.6 — ABNORMAL LOW
Hemoglobin: 11.5 — ABNORMAL LOW
Hemoglobin: 9.2 — ABNORMAL LOW
Hemoglobin: 9.6 — ABNORMAL LOW
Hemoglobin: 7.9 — CL
MCHC: 34.5
Platelets: 203
Platelets: 224
RBC: 3.16 — ABNORMAL LOW
RBC: 3.83 — ABNORMAL LOW
RBC: 2.64 — ABNORMAL LOW
RDW: 13.3
RDW: 13.5
WBC: 12.3 — ABNORMAL HIGH
WBC: 9.4

## 2011-06-18 LAB — BASIC METABOLIC PANEL
BUN: 8
CO2: 24
Calcium: 8.7
Chloride: 103
GFR calc non Af Amer: 53 — ABNORMAL LOW
GFR calc non Af Amer: 54 — ABNORMAL LOW
GFR calc non Af Amer: 58 — ABNORMAL LOW
Glucose, Bld: 151 — ABNORMAL HIGH
Glucose, Bld: 111 — ABNORMAL HIGH
Potassium: 3.5
Potassium: 3.5
Sodium: 131 — ABNORMAL LOW
Sodium: 132 — ABNORMAL LOW
Sodium: 132 — ABNORMAL LOW

## 2011-06-18 LAB — POCT I-STAT 7, (LYTES, BLD GAS, ICA,H+H)
Calcium, Ion: 1.1 — ABNORMAL LOW
HCT: 28 — ABNORMAL LOW
O2 Saturation: 100
Sodium: 130 — ABNORMAL LOW

## 2011-06-18 LAB — BLOOD GAS, ARTERIAL
Acid-base deficit: 0.7
Bicarbonate: 24.6 — ABNORMAL HIGH
Bicarbonate: 24.9 — ABNORMAL HIGH
FIO2: 0.21
O2 Saturation: 97.9
TCO2: 26
TCO2: 26.1
pH, Arterial: 7.423 — ABNORMAL HIGH
pO2, Arterial: 120 — ABNORMAL HIGH
pO2, Arterial: 93.7

## 2011-06-18 LAB — URINE MICROSCOPIC-ADD ON

## 2011-06-18 LAB — URINALYSIS, ROUTINE W REFLEX MICROSCOPIC
Bilirubin Urine: NEGATIVE
Glucose, UA: NEGATIVE
Protein, ur: NEGATIVE

## 2011-06-18 LAB — MAGNESIUM: Magnesium: 1.6

## 2011-06-18 LAB — APTT: aPTT: 36

## 2011-06-18 LAB — PROTIME-INR
INR: 1
Prothrombin Time: 12.9

## 2012-03-01 ENCOUNTER — Ambulatory Visit: Payer: Self-pay | Admitting: Surgery

## 2012-03-01 LAB — CBC WITH DIFFERENTIAL/PLATELET
Basophil #: 0 10*3/uL (ref 0.0–0.1)
Eosinophil %: 3.5 %
HCT: 34.1 % — ABNORMAL LOW (ref 35.0–47.0)
Lymphocyte #: 2 10*3/uL (ref 1.0–3.6)
MCH: 29.2 pg (ref 26.0–34.0)
MCV: 88 fL (ref 80–100)
Monocyte %: 10.4 %
Neutrophil %: 42.2 %
RDW: 13.1 % (ref 11.5–14.5)
WBC: 4.6 10*3/uL (ref 3.6–11.0)

## 2012-03-01 LAB — BASIC METABOLIC PANEL
Anion Gap: 8 (ref 7–16)
BUN: 16 mg/dL (ref 7–18)
Calcium, Total: 9.3 mg/dL (ref 8.5–10.1)
Co2: 27 mmol/L (ref 21–32)
EGFR (Non-African Amer.): 47 — ABNORMAL LOW

## 2012-03-08 ENCOUNTER — Ambulatory Visit: Payer: Self-pay | Admitting: Surgery

## 2012-04-04 ENCOUNTER — Inpatient Hospital Stay: Payer: Self-pay | Admitting: Internal Medicine

## 2012-04-04 LAB — URINALYSIS, COMPLETE
Ketone: NEGATIVE
Ph: 6 (ref 4.5–8.0)
Protein: NEGATIVE
RBC,UR: 2 /HPF (ref 0–5)

## 2012-04-04 LAB — COMPREHENSIVE METABOLIC PANEL
Anion Gap: 13 (ref 7–16)
Bilirubin,Total: 0.4 mg/dL (ref 0.2–1.0)
Calcium, Total: 9.1 mg/dL (ref 8.5–10.1)
Creatinine: 1.37 mg/dL — ABNORMAL HIGH (ref 0.60–1.30)
Glucose: 104 mg/dL — ABNORMAL HIGH (ref 65–99)
Osmolality: 244 (ref 275–301)
Potassium: 3.4 mmol/L — ABNORMAL LOW (ref 3.5–5.1)
Sodium: 121 mmol/L — ABNORMAL LOW (ref 136–145)
Total Protein: 7.7 g/dL (ref 6.4–8.2)

## 2012-04-04 LAB — LIPASE, BLOOD: Lipase: 152 U/L (ref 73–393)

## 2012-04-04 LAB — CBC
HCT: 33.8 % — ABNORMAL LOW (ref 35.0–47.0)
MCH: 28.6 pg (ref 26.0–34.0)
MCHC: 33.6 g/dL (ref 32.0–36.0)
RBC: 3.96 10*6/uL (ref 3.80–5.20)
WBC: 5.4 10*3/uL (ref 3.6–11.0)

## 2012-04-04 LAB — TROPONIN I: Troponin-I: <0.02 ng/mL
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
## 2012-04-05 LAB — BASIC METABOLIC PANEL
Calcium, Total: 8.6 mg/dL (ref 8.5–10.1)
EGFR (Non-African Amer.): 44 — ABNORMAL LOW
Glucose: 93 mg/dL (ref 65–99)
Sodium: 129 mmol/L — ABNORMAL LOW (ref 136–145)

## 2012-04-05 LAB — CK TOTAL AND CKMB (NOT AT ARMC): CK, Total: 112 U/L (ref 21–215)

## 2012-04-05 LAB — TROPONIN I: Troponin-I: <0.02 ng/mL

## 2013-04-13 ENCOUNTER — Emergency Department: Payer: Self-pay | Admitting: Emergency Medicine
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
## 2014-06-03 ENCOUNTER — Other Ambulatory Visit: Payer: Self-pay

## 2014-06-03 DIAGNOSIS — Z48812 Encounter for surgical aftercare following surgery on the circulatory system: Secondary | ICD-10-CM

## 2014-06-03 DIAGNOSIS — I714 Abdominal aortic aneurysm, without rupture, unspecified: Secondary | ICD-10-CM

## 2014-06-11 ENCOUNTER — Encounter: Payer: Self-pay | Admitting: Vascular Surgery

## 2014-06-25 ENCOUNTER — Encounter: Payer: Self-pay | Admitting: Vascular Surgery

## 2014-06-26 ENCOUNTER — Ambulatory Visit (INDEPENDENT_AMBULATORY_CARE_PROVIDER_SITE_OTHER)
Admission: RE | Admit: 2014-06-26 | Discharge: 2014-06-26 | Disposition: A | Discharge status: Home / Self Care | Payer: Medicare Other | Source: Ambulatory Visit | Attending: Vascular Surgery | Admitting: Vascular Surgery

## 2014-06-26 ENCOUNTER — Encounter: Payer: Self-pay | Admitting: Vascular Surgery

## 2014-06-26 ENCOUNTER — Ambulatory Visit (HOSPITAL_COMMUNITY)
Admission: RE | Admit: 2014-06-26 | Discharge: 2014-06-26 | Disposition: A | Discharge status: Home / Self Care | Payer: Medicare Other | Source: Ambulatory Visit | Attending: Vascular Surgery | Admitting: Vascular Surgery

## 2014-06-26 ENCOUNTER — Ambulatory Visit (INDEPENDENT_AMBULATORY_CARE_PROVIDER_SITE_OTHER): Payer: Medicare Other | Admitting: Vascular Surgery

## 2014-06-26 VITALS — BP 153/78 | HR 64 | Ht 65.0 in | Wt 192.0 lb

## 2014-06-26 DIAGNOSIS — Z48812 Encounter for surgical aftercare following surgery on the circulatory system: Secondary | ICD-10-CM | POA: Diagnosis not present

## 2014-06-26 DIAGNOSIS — I714 Abdominal aortic aneurysm, without rupture, unspecified: Secondary | ICD-10-CM

## 2014-06-26 DIAGNOSIS — M79605 Pain in left leg: Secondary | ICD-10-CM

## 2014-06-26 NOTE — Assessment & Plan Note (Signed)
The patient is doing well status post open repair of her abdominal aortic aneurysm 2009. I do not see any problems related to her graft. We will see her back as needed. Fortunately she is not a smoker. She is on aspirin.

## 2014-06-26 NOTE — Progress Notes (Signed)
[facility]   Patient name: Betty Nielsen MRN: [id number] DOB: 26-Jan-1946 Sex: female  REASON FOR VISIT: Follow up after abdominal aortic aneurysm repair.  HPI: Betty Nielsen is a 68 y.o. female who underwent open repair of an abdominal aortic aneurysm in 2009. This was for a 5.7 cm infrarenal abdominal aortic aneurysm. She was sent for a follow up if she had not been seen in several years. She denies any significant abdominal pain or back pain.  Her only real complaint today is some pain her left leg posteriorly which is aggravated by standing. She has also noted some swelling in the left leg. This has been present for approximately a week in her symptoms have been stable. There are no other aggravating or alleviating factors. She has no history of DVT. She has no history of injury to her leg.   Past Medical History  Diagnosis Date  . Hypertension   . Hyperlipidemia   . Plantar fasciitis   . Chronic kidney disease   . Loss of hearing   . Arthrosis of knee   . Metabolic syndrome    Family History  Problem Relation Age of Onset  . Hypertension Sister    SOCIAL HISTORY: History  Substance Use Topics  . Smoking status: Former Research scientist (life sciences)  . Smokeless tobacco: Never Used  . Alcohol Use: No   Allergies  Allergen Reactions  . Ace Inhibitors Cough  . Norvasc [Amlodipine Besylate]     Pruritus   . Sulfa Antibiotics    Current Outpatient Prescriptions  Medication Sig Dispense Refill  . acetaminophen (TYLENOL ARTHRITIS PAIN) 650 MG CR tablet Take 650 mg by mouth every 8 (eight) hours as needed for pain.      Marland Kitchen aspirin 81 MG tablet Take 81 mg by mouth daily.      . cetirizine (ZYRTEC) 10 MG tablet Take 10 mg by mouth daily.      . citalopram (CELEXA) 20 MG tablet Take 20 mg by mouth daily.      . hydrALAZINE (APRESOLINE) 25 MG tablet Take 25 mg by mouth 2 (two) times daily.      . irbesartan (AVAPRO) 300 MG tablet Take 300 mg by mouth daily.      Marland Kitchen  levothyroxine (SYNTHROID, LEVOTHROID) 75 MCG tablet Take 75 mcg by mouth. 1 tablet daily and 1/2 tablet on Sundays until f/u      . loratadine (CLARITIN) 10 MG tablet Take 10 mg by mouth daily.      . pantoprazole (PROTONIX) 40 MG tablet Take 40 mg by mouth daily.      Marland Kitchen docusate sodium (COLACE) 100 MG capsule Take 100 mg by mouth 2 (two) times daily.       No current facility-administered medications for this visit.   REVIEW OF SYSTEMS: Valu.Nieves ] denotes positive finding; [  ] denotes negative finding  CARDIOVASCULAR:  [ ]  chest pain   [ ]  chest pressure   [ ]  palpitations   [ ]  orthopnea   [ ]  dyspnea on exertion   [ ]  claudication   [ ]  rest pain   [ ]  DVT   [ ]  phlebitis PULMONARY:   [ ]  productive cough   [ ]  asthma   [ ]  wheezing NEUROLOGIC:   [ ]  weakness  [ ]  paresthesias  [ ]  aphasia  [ ]  amaurosis  [ ]  dizziness HEMATOLOGIC:   [ ]  bleeding problems   [ ]  clotting disorders MUSCULOSKELETAL:  [ ]   joint pain   [ ]  joint swelling Valu.Nieves ] leg swelling GASTROINTESTINAL: [ ]   blood in stool  [ ]   hematemesis GENITOURINARY:  [ ]   dysuria  [ ]   hematuria PSYCHIATRIC:  [ ]  history of major depression INTEGUMENTARY:  [ ]  rashes  [ ]  ulcers CONSTITUTIONAL:  [ ]  fever   [ ]  chills  PHYSICAL EXAM: Filed Vitals:   06/26/14 0920  BP: 153/78  Pulse: 64  Height: 5\' 5"  (1.651 m)  Weight: 192 lb (87.091 kg)  SpO2: 99%   Body mass index is 31.95 kg/(m^2). GENERAL: The patient is a well-nourished female, in no acute distress. The vital signs are documented above. CARDIOVASCULAR: There is a regular rate and rhythm. I do not detect carotid bruits. She has palpable pedal pulses bilaterally. She has mild left lower extremity swelling. PULMONARY: There is good air exchange bilaterally without wheezing or rales. ABDOMEN: Soft and non-tender with normal pitched bowel sounds.  MUSCULOSKELETAL: There are no major deformities or cyanosis. NEUROLOGIC: No focal weakness or paresthesias are detected. SKIN: There  are no ulcers or rashes noted. PSYCHIATRIC: The patient has a normal affect.  DATA:  I have independently reviewed her duplex. I reviewed her images and I think that we can adequately see her repair. The graft overlies some laminated thrombus and there is no evidence of problems with her graft noted.  Given that she was having left leg pain and some left leg swelling we obtained a venous duplex scan. This shows no evidence of DVT.  MEDICAL ISSUES:  AAA (abdominal aortic aneurysm) without rupture The patient is doing well status post open repair of her abdominal aortic aneurysm 2009. I do not see any problems related to her graft. We will see her back as needed. Fortunately she is not a smoker. She is on aspirin.   [facility] [place] Beeper: [phone]

## 2014-07-03 DIAGNOSIS — M1990 Unspecified osteoarthritis, unspecified site: Secondary | ICD-10-CM | POA: Insufficient documentation

## 2014-11-18 ENCOUNTER — Emergency Department: Payer: Self-pay | Admitting: Emergency Medicine

## 2014-11-18 IMAGING — CT CT ABD-PELV W/ CM
2 of 6 series · 15 of 46 positions shown, 17 images · IV contrast (omnipaque)
Comparison: [DATE]

CLINICAL DATA: Severe abdominal pain and hypotension

EXAM:
CT ABDOMEN AND PELVIS WITH CONTRAST
TECHNIQUE: Multidetector CT imaging of the abdomen and pelvis was performed
using the standard protocol following bolus administration of
intravenous contrast.
CONTRAST:  100 cc Omnipaque 300 intravenous

[Series 2: routine abd pel with · axial · 0.76mm/px · z∈[-486,-72]mm · 12 of 97 slices shown, 14 images]
[im 7/97  soft-tissue]
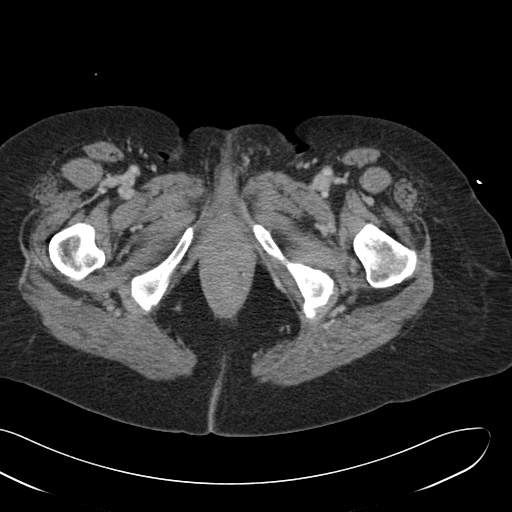
[im 7/97  bone]
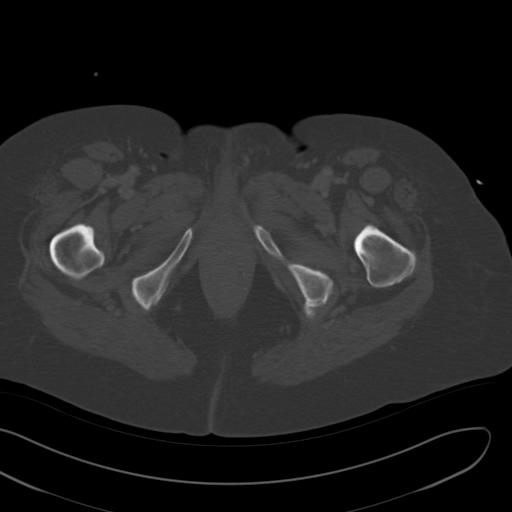
[im 13/97  soft-tissue]
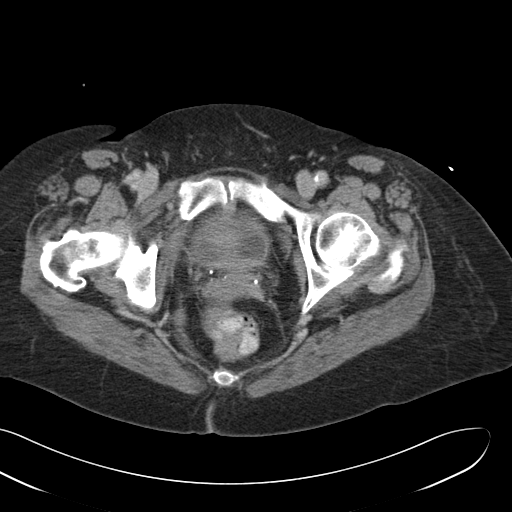
[im 20/97  soft-tissue]
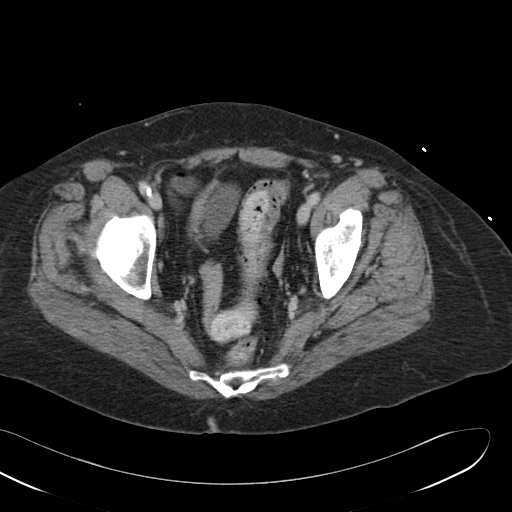
[im 33/97  soft-tissue]
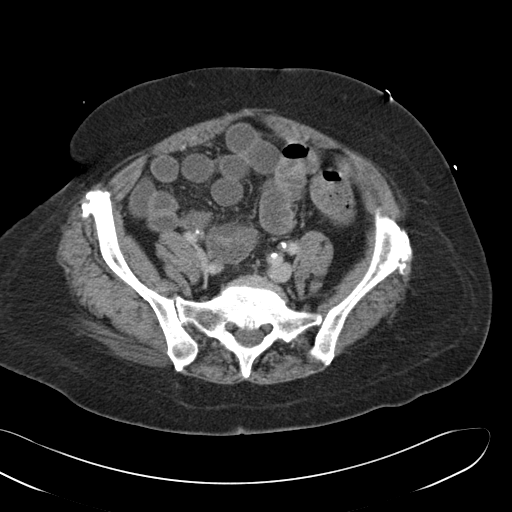
[im 39/97  soft-tissue]
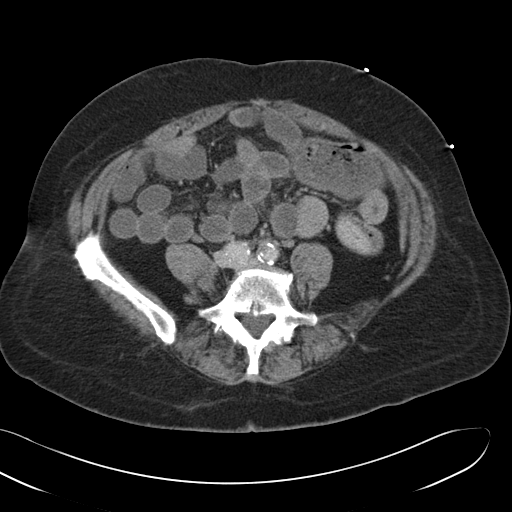
[im 45/97  soft-tissue]
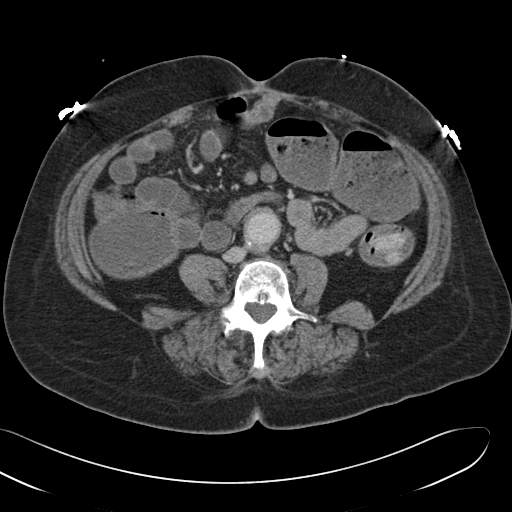
[im 52/97  soft-tissue]
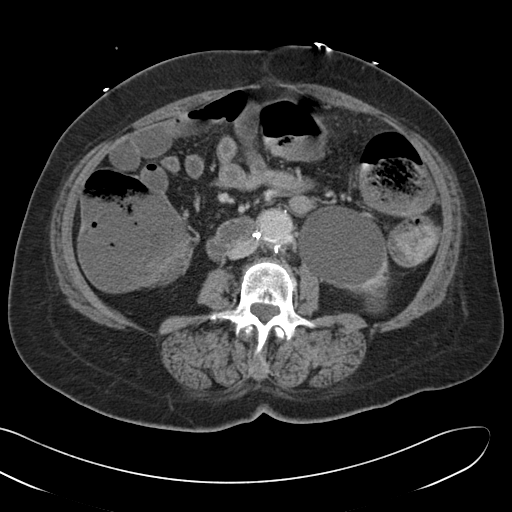
[im 58/97  soft-tissue]
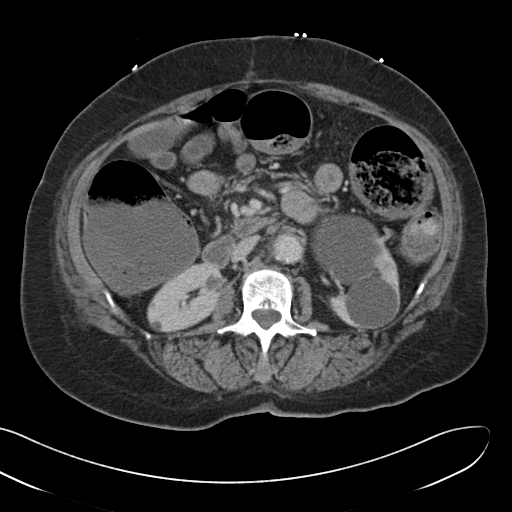
[im 65/97  soft-tissue]
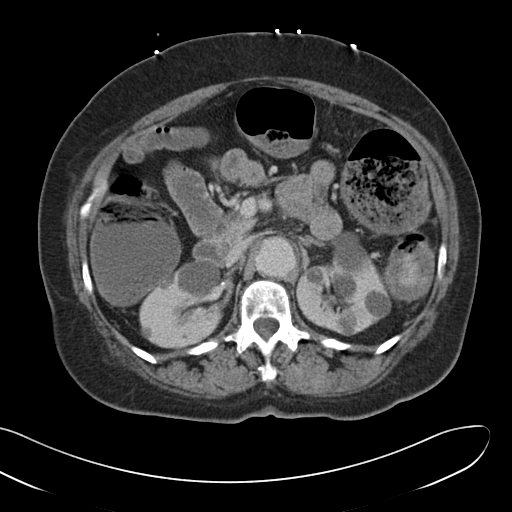
[im 65/97  bone]
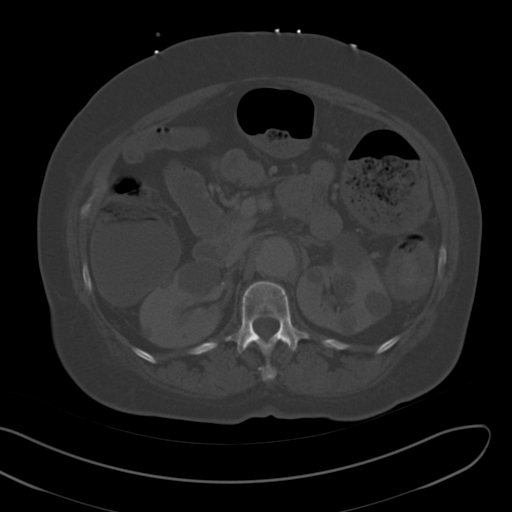
[im 77/97  soft-tissue]
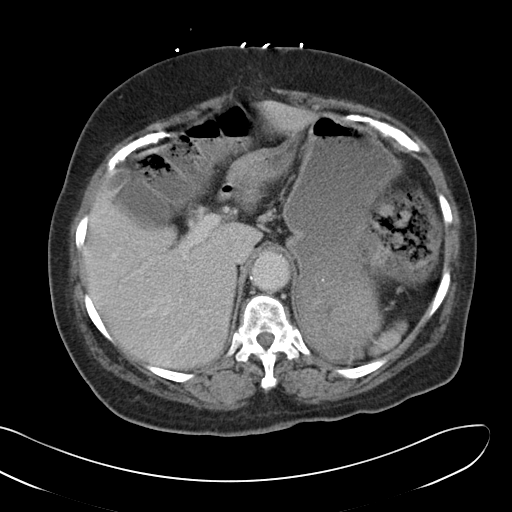
[im 84/97  soft-tissue]
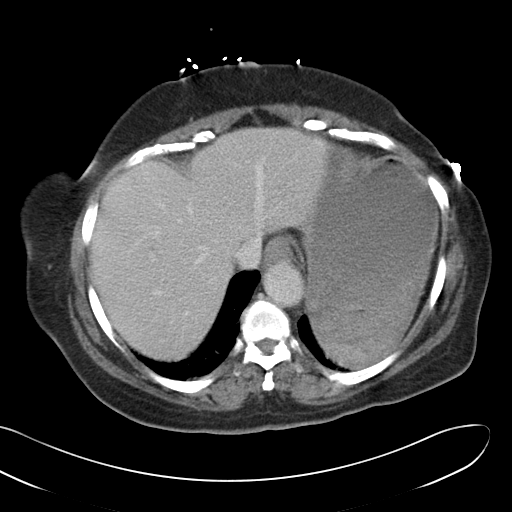
[im 90/97  soft-tissue]
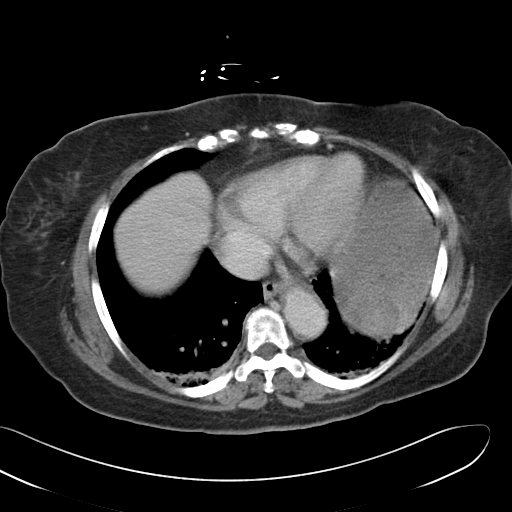

[Series 5: cor routine abd pel with · coronal · 0.71mm/px · 3 of 150 slices shown]
[im 50/150  soft-tissue]
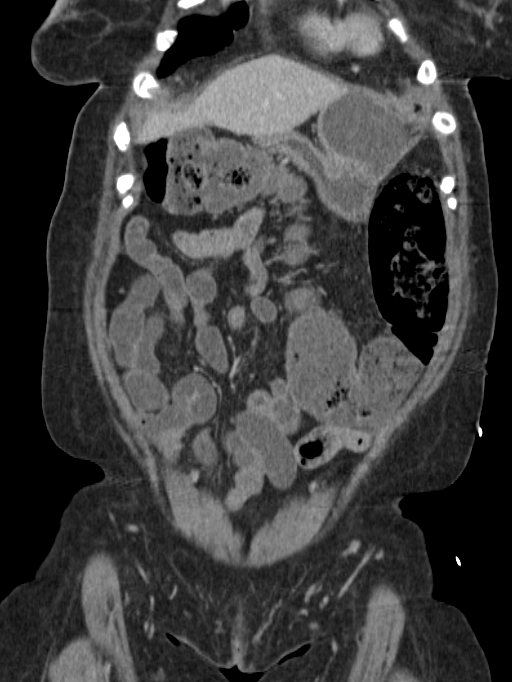
[im 67/150  soft-tissue]
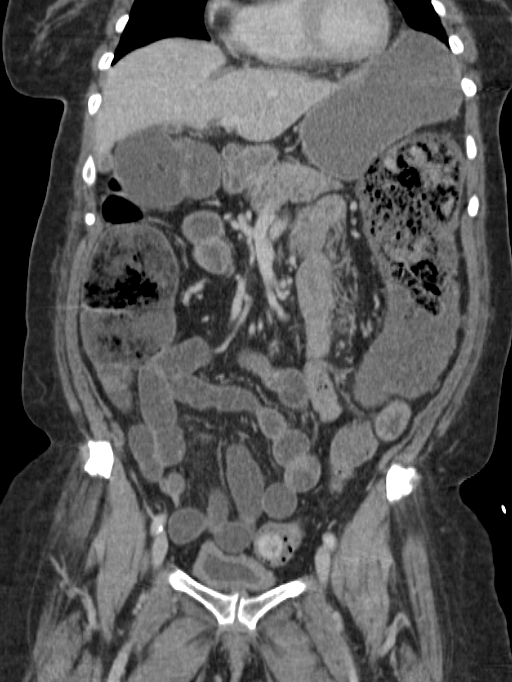
[im 83/150  soft-tissue]
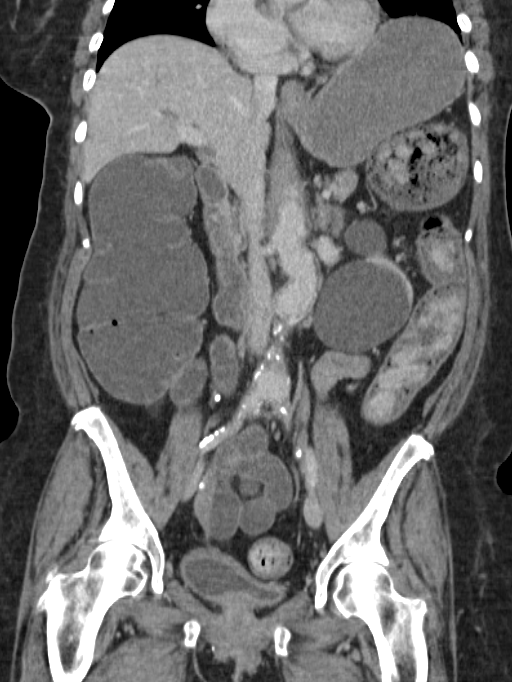

[15 of 46 positions shown; findings below may reference images not displayed]

FINDINGS: BODY WALL: Unremarkable.

LOWER CHEST: Subsegmental atelectasis in the lower lobes.

ABDOMEN/PELVIS:

Liver: No focal abnormality.

Biliary: No evidence of biliary obstruction or stone.

Pancreas: Unremarkable.

Spleen: Small volume without focal abnormality.

Adrenals: Unremarkable.

Kidneys and ureters: Numerous bilateral cortical cysts, pattern
stable from [AE]. Bilateral hilar calcifications are likely
arterial. No hydronephrosis.

Bladder: Mild bladder wall thickening is likely from
underdistention. No associated fat inflammation.

Reproductive: Hysterectomy.  The ovaries are unremarkable.

Bowel: Distended bowel, especially the colon, with the cecum
measuring up to 9 cm in diameter. Distal small bowel is also
fluid-filled and distended, but not overtly dilated. There is no
transition point suggestive of a mechanical obstruction, with
gradual colonic tapering at the level of the distal descending and
sigmoid colon. The rectum is decompressed. No mass is seen in the
area of colonic tapering. No evidence of pneumatosis, perforation,
or bowel necrosis. Appendectomy.

Retroperitoneum: No mass or adenopathy.

Peritoneum: No ascites or pneumoperitoneum.

Vascular: Extensive aortic and branch vessel atherosclerosis without
major vessel occlusion or high-grade stenosis. Status post open
abdominal aortic aneurysm repair. There is no surrounding gas or new
wall thickening. No enlargement of the aortic lumen compared to
[AE].

OSSEOUS: No acute abnormalities. Mild L4-5 slip from advanced facet
arthropathy.
IMPRESSION: 1. Adynamic ileus pattern, predominantly affecting the colon.
2. Polycystic kidneys.
3. Extensive atherosclerosis, status post open abdominal aortic
aneurysm repair.

## 2015-01-07 NOTE — H&P (Signed)
PATIENT NAME:  Betty Nielsen, Betty Nielsen MR#:  [id number] DATE OF BIRTH:  13-Aug-1946  DATE OF ADMISSION:  04/04/2012  ER REFERRING PHYSICIAN: Arman Filter, MD  PRIMARY CARE PHYSICIAN: Steele Sizer, MD  SURGEON: Rochel Brome, MD  CHIEF COMPLAINT: Multiple abdominal complaints.   HISTORY OF PRESENT ILLNESS: The patient is a 69 year old female with past medical history of hypertension, hypothyroidism, hyperlipidemia, and gastroesophageal reflux disease who had ventral hernia repair by Dr. Rochel Brome on 03/08/2012. She reports that 1-1/2 weeks after that she developed a urinary tract infection and severe acid reflux symptoms. She was not able to be seen in Dr. Thompson Caul office so she went to [facility] and was started on Cipro for her urinary tract infection. She took a couple of doses of Cipro and subsequently developed abdominal pain, nausea, vomiting, and diarrhea. Therefore, she stopped taking that antibiotic. She reports that she had not been feeling well. Therefore, she went to see Dr. Ancil Boozer yesterday who was going to order some tests for her to rule out any gallbladder pathology as an outpatient. However, the patient did not feel well, therefore, she decided to come to the Emergency Room. She has multiple nonspecific complaints. She reports that even prior to her ventral hernia repair she was having these symptoms which are described as sudden onset of abdominal pain with fluttering, churning/boiling in the stomach following which she has nausea and an urge to have a bowel movement. They have been more frequent after her surgery. She denies any fluttering in her chest. During these symptoms, she sometimes feels like she is going to pass out. On being questioned whether she is actually depressed, she reports that when she is concerned about her health she has these symptoms more often. She also reports she has very poor appetite. She will take two to three bites of her food and not want to eat  anymore. Denies the food tastes bad, she just does not want to eat. In the ER, she was initially normotensive but her blood pressure dropped to 57/42, but recovered with IV fluid hydration. She is currently asymptomatic and resting.   ALLERGIES: Norvasc, sulfa.   MEDICATIONS:  1. Benazepril/HCTZ 25/12.5 mg 1 tablet daily.  2. Cipro 250 mg twice a day. The patient has not taken it because it made her feel sick.  3. Colace 100 mg daily. 4. Fish oil 1000 mg daily. 5. Synthroid 75 mcg daily.  6. Protonix 40 mg daily   PAST MEDICAL HISTORY:  1. Hypertension.  2. Hypothyroidism.  3. Gastroesophageal reflux disease.  4. Hyperlipidemia.  5. Right ear deafness.   PAST SURGICAL HISTORY:  1. Hysterectomy.  2. Appendectomy.  3. Thyroid removal.  4. Right ear surgery. 5. Abdominal aortic aneurysm status post repair.  6. Ventral hernia repair on 03/08/2012.  SOCIAL HISTORY: She denies any history of smoking, alcohol, or drug abuse. She lives alone.   FAMILY HISTORY: Mother had morbid obesity and had a pacemaker. Father possibly died of what is described as intracranial hemorrhage.   REVIEW OF SYSTEMS: CONSTITUTIONAL: Denies any fever, but reports that she has chills. Reports fatigue and weakness. EYES: Denies any blurred or double vision. ENT: Denies any tinnitus or ear pain. RESPIRATORY: Denies any cough or wheezing. CARDIOVASCULAR: Denies any chest pain or palpitations. GI: Multiple abdominal complaints including nausea, vomiting, and abdominal pain. GU: Denies any dysuria or hematuria. ENDOCRINE: Denies any polyuria or nocturia. HEME/LYMPH: Denies any anemia or easy bruisability. INTEGUMENT: Denies any acne or rash. MUSCULOSKELETAL:  Denies any swelling or gout. NEUROLOGICAL: Denies any numbness or weakness. PSYCH: Denies any history of anxiety.    PHYSICAL EXAMINATION:   VITAL SIGNS: Temperature 98.6, heart rate 77, respiratory rate 20, blood pressure was 140/76 but dropped as low as 57/42.  Currently it is 134/74.  Pulse oximetry is 99% on room air.   GENERAL: The patient is a 69 year old African American female lying comfortably in bed, not in acute distress.   HEAD: Atraumatic, normocephalic.   EYES: Mild pallor. No icterus or cyanosis. Pupils equal, round, and reactive to light and accommodation. Extraocular movements intact.   ENT: Wet mucous membranes. No oropharyngeal erythema or thrush.  NECK: Supple. No masses. No JVD. No thyromegaly or lymphadenopathy.   CHEST WALL: No tenderness to palpation. Not using accessory muscles of respiration. No intercostal muscle retraction.   LUNGS: Bilaterally clear to auscultation. No wheezing, rales, or rhonchi.   CARDIOVASCULAR: S1 and S2 regular. No murmurs, rubs, or gallops.   ABDOMEN: Soft and nondistended. No guarding or rigidity. Normal bowel sounds. The patient has a recent surgical scar from her ventral hernia repair. The patient reports pain in the right mid quadrant on deep palpation.   SKIN: No rashes or lesions.   PERIPHERIES: No pedal edema.   MUSCULOSKELETAL: No cyanosis or clubbing.   NEUROLOGIC: Awake, alert, and oriented x3. Nonfocal neurological examination. Cranial nerves II through XII grossly intact.   PSYCH: Normal mood and affect.   RESULTS: Ultrasound of the abdomen is normal.   CAT scan of the abdomen with contrast shows tiny thoracic aortic stable aneurysm versus ulceration. Small stable abdominal aortic aneurysm. Moderate esophageal dilatation possibly due to reflux. Soft tissue thickening noted in the subcutaneous tissue from prior umbilical hernia repair.   Glucose 104, BUN 12, creatinine 1.37, sodium 121, potassium 3.4, chloride 82, and CO2 26. White count 5.4, hemoglobin 11.3, hematocrit 33.4, and platelet count 323.   Urinalysis showed no evidence of infection.   ASSESSMENT AND PLAN: A 69 year old female with history of hypertension and hypothyroidism who presents with multiple abdominal  complaints.  1. Multiple abdominal complaints. The patient describes attacks where she feels abdominal pain and sudden fluttering in her abdomen with feeling of nausea, vomiting, and an urge to have a bowel movement. Sometimes during these episodes she feels like she is going to pass out. She also has these episodes more when she is anxious or worried about her health. The etiology is unclear at present, could be related to anxiety. Therefore, we will obtain a psychiatry consultation. The patient adamantly denies that she has these symptoms in her chest, but we will monitor her on telemetry and do a 2-D echocardiogram to rule out any cardiac etiology. These symptoms could also be related to some GI issues including GERD/IBS, especially since she has moderate dilatation of her esophageal on the CAT scan. Therefore, we will get a gastroenterology consultation and we will place her on PPI twice a day. We will also check orthostatic vital signs. 2. Hypertension. The patient has a history of hypertension, but was hypotensive in the ER and is responding to fluids. We will hydrate her with IV fluids and hold her antihypertensive medications for the time being. We will a check serum cortisol. 3. Hyperglycemia, possibly reactive.  4. Acute renal failure. The patient's creatinine was normal in June of 2013. She has not been eating and drinking well for the past several days. We will hydrate her with IV fluids and monitor her renal function closely.  5. Hyponatremia. In June her sodium was normal. This could be related to poor oral intake as well as use of diuretics for hypertensive control. We will hydrate with IV fluids, hold her HCTZ, and recheck in the a.m.  6. Hypokalemia. We will replace oral and IV.  7. Anemia. Possibly of chronic disease, stable.   8. Hypothyroidism. We will check a TSH and continue her current dose of Synthroid.  9. History of hyperlipidemia. We will continue fish oil.   TIME SPENT: 75  minutes.  ____________________________ Cherre Huger, MD sp:slb D: 04/04/2012 21:52:37 ET T: 04/05/2012 07:17:57 ET JOB#: [id number]  cc: Cherre Huger, MD, <Dictator> Bethena Roys. Ancil Boozer, MD Cherre Huger MD ELECTRONICALLY SIGNED 04/06/2012 7:19

## 2015-01-07 NOTE — Consult Note (Signed)
PATIENT NAME:  Betty Nielsen, MALTBIE MR#:  [id number] DATE OF BIRTH:  1945/10/24  DATE OF CONSULTATION:  04/05/2012  REFERRING PHYSICIAN:  Cherre Huger, MD  CONSULTING PHYSICIAN:  Steva Colder. Nicolasa Ducking, MD  REASON FOR CONSULTATION: Anxiety.  IDENTIFYING INFORMATION: Betty Nielsen is a 69 year old widowed African American female who currently lives alone in the [place] area. She has one son, age 57.   HISTORY OF PRESENT ILLNESS: Betty Nielsen is a 69 year old widowed African American female with a history of multiple medical problems including hypertension, hyperthyroidism, and gastroesophageal reflux disease who was admitted to the Medicine Service with multiple somatic complaints primarily including abdominal complaints. The patient reports that within the past month she has had a hernia operation as well as a urinary tract infection and worsening reflux. Psychiatry was consulted secondary to the fact that the patient had multiple somatic complaints and appeared to be anxious. During the interview the patient herself did report that she had had decreased appetite over the past one week along with nausea and some vomiting when she says her reflex gets very severe. She says her energy level has been low but she denies any other mood symptoms including feelings of hopelessness or helplessness, crying spells, difficulty with focus or concentration, or insomnia. She denies feeling depressed but she does admit to anxiety. The patient initially denies any stressors and superficially denies any severe mood symptoms but after further examination did talk about feeling like she has a "foul body odor" that prevents her from engaging in activities outside of the house. The patient did appear to be delusional with regards to this body odor. She says that people [facility] not allow [facility] to [facility] because she offends them with her body odor. She said she took an early retirement at the age of 30 from her prior  job due to the fact that she felt like her body odor was disturbing her colleagues. She says at times she does not want to get into an elevator because she is afraid to offend people. She says that she goes from doctor to doctor to try to find treatment for this body odor but has not been able to. She does appear to be endorsing some mild paranoid thoughts with regards to the body odor but denies any hallucinations including auditory or visual hallucinations. She denies any history of any panic attacks. The patient denies any suicidal thoughts or history of suicidal attempts in the past. She denies any other delusional thoughts other than the body odor. She does feel like what is going on with her currently including her abdominal complaints are related to the body odor.   PAST PSYCHIATRIC HISTORY: The patient denies any history of any prior inpatient psychiatric hospitalizations or suicide attempts. She says she's not ever seen a therapist or counselor in the past. She denies being on any psychotropic medications in the past to help with anxiety. She denies ever being on any benzodiazepines either. She does state that her outpatient PCP at [facility] did just give her a prescription recently for anxiety but she does not know what it is.    SUBSTANCE ABUSE HISTORY: The patient denies any history of any heavy alcohol use, cocaine, cannabis, opiate, or stimulant use. She denies any tobacco use.   FAMILY PSYCHIATRIC HISTORY: She denies any history of any mental illness or substance use in the family.   MEDICATIONS:  1. Benazepril/hydrochlorothiazide 12/12.5 mg p.o. daily. 2. Cipro 250 mg p.o. b.i.d.  prescribed for her urinary tract infection but she has been noncompliant with it because she says it makes her feel sick. 3. Colace 100 mg daily. 4. Fish Oil 1000 mg daily. 5. Synthroid 75 mcg p.o. daily. 6. Protonix 40 mg p.o. daily.   PAST MEDICAL HISTORY:   1. Hypertension. 2. Hypothyroidism.  3. Gastroesophageal reflux disease.  4. Hyperlipidemia.  5. Deaf in the right ear. 6. History of hysterectomy.  7. History of appendectomy.  8. History of thyroid removal.  9. History of right ear surgery. 10. Abdominal aortic aneurysm status post repair.  11. Ventral hernia repair on 03/08/2012.    She denies any history of any prior TBI or seizures.   ALLERGIES: Amlodipine and sulfa.   SOCIAL HISTORY: The patient was born and raised in the [place] area by her mother primarily. She says that her father died when she was [age] years old. She denies any history of any physical or sexual abuse. She had a 10th grade education and then later came back and got her GED. 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 Her second husband passed away in 2006/12/17 from leukemia and she is currently widowed and living alone in a house in the [place] area. She does report having a good relationship with her son who is 67 years old.   LEGAL HISTORY: She denies any history of any arrests or incarcerations.   MENTAL STATUS EXAM: Betty Nielsen is a 69 year old African American female with gray hair. She was laying in her hospital bed comfortably eating breakfast and watching TV. She was fully alert and oriented to time, place, and situation. Speech was regular rate and rhythm, fluent and coherent. Mood was described as being "not so good". Affect was mildly anxious. Thought processes were linear, logical, and goal directed but prominent for delusional thoughts regarding body odor. She did not appear to be paranoid about anything other than body odor. She denied any suicidal or homicidal thoughts. She denied any current auditory or visual hallucinations. Attention and concentration are fairly good. Judgment and insight are good. Recall was 3 out of 3 initially and 3 out of 3 after five minutes. She could spell world backwards and do serial sevens  although the patient was very offended by being asked questions with regards to memory and recall. Abstraction was good.   SUICIDE RISK ASSESSMENT: At this time Ms. Houseknecht denies any current suicidal thoughts or intent to harm herself or others. She denies any thoughts to harm anyone else. She does appear to be wanting to get treatment for this "body odor". She denies any access to guns.   REVIEW OF SYSTEMS: CONSTITUTIONAL: She denies any weakness but does complain of decreased energy level in the past one week. She denies any weight changes. She denies any fever, chills, or night sweats. Her appetite has been decreased in the past one week. HEAD: She denies any headaches or dizziness. EYES: She denies any diplopia or blurred vision. ENT: She denies any hearing loss, neck pain, or throat pain. RESPIRATORY: She denies any shortness of breath or cough. CARDIOVASCULAR: She denies any chest pain or orthopnea. GI: She does complain of some nausea and vomiting this past week secondary to what she believes is reflux. She does complain of some mild generalized abdominal pain. She denies any change in bowel movements. GU: She denies incontinence or problems with frequency of urine. ENDOCRINE: She denies any heat or cold intolerance. LYMPHATIC: She denies any anemia  or easy bruising. MUSCULOSKELETAL: She denies any muscle or joint pain. NEUROLOGICAL: She denies any tingling or weakness. PSYCHIATRIC: Please see history of present illness.   PHYSICAL EXAMINATION:   VITAL SIGNS: Blood pressure 107/54, heart rate 58, respirations 20, temperature 97.7, pulse oximetry 99% on room air.   Please see initial physical exam as completed by Dr. Cherre Huger.  LABORATORY, DIAGNOSTIC, AND RADIOLOGICAL DATA: Sodium on admission 121, potassium 3.4, chloride 82, CO2 26, BUN 12, creatinine 1.37, glucose 104. Lipase 152. LFTs within normal limits. Troponin less than 0.02. White blood cell count 5.4, hemoglobin 11.3, platelet  count 323. Urinalysis was nitrite negative with trace leukocyte esterase.   CT of the chest showed tiny thoracic aortic stable aneurysm versus ulceration, small stable aortic aneurysm. No evidence of rupture or dissection. Moderate esophageal dilation. Gallbladder normal on right upper quadrant ultrasound.   DIAGNOSES:  AXIS I:  1. 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 3. Rule out major depression with psychosis.   AXIS II: Deferred.   AXIS III:  1. Hypertension. 2. Hypothyroidism. 3. Hyperlipidemia.   AXIS IV: Moderate to severe. Lack of primary support, widowed.   AXIS V: GAF at present equals 40 to 50.   ASSESSMENT AND TREATMENT RECOMMENDATIONS: Ms. Maidonado is a 69 year old widowed African American female with multiple medical problems admitted to the Medicine Service with hyponatremia and abdominal pain as well as multiple other somatic complaints. The patient expressing a paranoid and delusional thought about having an offensive body odor. As a result, she does appear to be isolated in her home and does not engage in a lot of activities outside of the home. She feels that the body odor is causing a lot of her current physical problems including what she says are "gallbladder issues". The patient also believes the body odor contributed to her worsening reflux disease. She is willing to take a medication for anxiety and acknowledges having some anxiety but insight into delusional thought is poor. Will plan to start Celexa 20 mg p.o. daily for anxiety and Risperdal 1 mg p.o. at bedtime for psychosis. Will plan to titrate up the Risperdal as needed and tolerated to help with delusional thought. At this time she denies any suicidal thoughts or intent to harm herself or anyone else and is not an imminent Nielsen to herself. She has been having these thoughts of foul body odor for many years. She is in need of outpatient psychiatric services at the time of  discharge. Will try to make an appointment with [facility]. As a result of having this delusional thought, the patient has isolated herself at home and does not engage in a lot of activities although she strongly and adamantly denies any depressive symptoms. I do suspect that the patient may be experiencing depression. Will continue to try to build rapport with the patient on an inpatient basis. Will continue to follow the patient while she is in the hospital. Risks, benefits, and alternatives to treatment were discussed with the patient and she consented to the treatment plan. Will check EKG to rule out QTc prolongation.   TIME SPENT: 80 minutes  ____________________________ Steva Colder. Nicolasa Ducking, MD akk:drc D: 04/05/2012 09:26:00 ET T: 04/05/2012 09:56:24 ET JOB#: [id number]  cc: Kristen Bushway K. Nicolasa Ducking, MD, <Dictator> Chauncey Mann MD ELECTRONICALLY SIGNED 04/06/2012 15:03

## 2015-01-07 NOTE — Consult Note (Signed)
Brief Consult Note: Comments: noted GI consult cancelled this am at [facility] by Dr Reece Levy and patient to be discharged.  Electronic Signatures: Stephens November H (NP)  (Signed 17-Jul-13 13:46)  Authored: Brief Consult Note   Last Updated: 17-Jul-13 13:46 by Theodore Demark (NP)

## 2015-01-07 NOTE — Discharge Summary (Signed)
PATIENT NAME:  Betty Nielsen, STOESSEL MR#:  [id number] DATE OF BIRTH:  1946-04-06  DATE OF ADMISSION:  04/04/2012 DATE OF DISCHARGE:  04/05/2012  PRIMARY CARE PHYSICIAN:  Dr. Steele Sizer  DISCHARGE DIAGNOSES:  1. Gastroesophageal reflux disease. 2. Hyponatremia. 3. Dehydration.  4. Psychosis.  5. Depression. 6. Mild acute renal failure.   CONSULTS:  Dr. Nicolasa Ducking, psychiatry.   IMAGING STUDIES:  1. CT scan of the chest, abdomen, and pelvis which showed a tiny thoracic aortic aneurysm, stable, a small, stable abdominal aortic aneurysm, and gastroesophageal reflux disease.  2. Ultrasound of the abdomen showed no gallbladder pathology.   ADMITTING HISTORY AND  PHYSICAL: Please see the detailed History and Physical dictated on 04/04/2012 by Dr. Karsten Fells. In brief, 69 year old female patient who recently had surgery for ventral hernia who presented to the Emergency Room complaining of abdominal pain and dizziness. The patient was found to have hyponatremia and was admitted for dehydration, hyponatremia, and further work-up of her abdominal pain.   HOSPITAL COURSE:  1. Abdominal pain: The patient had a CT scan of the abdomen and pelvis along with ultrasound showing no gallbladder pathology or other acute abnormalities. With treatment with PPIs the patient's abdominal pain improved well, suggesting her abdominal pain was secondary to gastroesophageal reflux disease. The patient did have multiple somatic complaints and psychiatry was consulted.    2. Psychosis/depression: The patient had strange explanations for her multiple complaints, as she mentioned that she thinks her unbearable body odor has resolved with increasing sodium in the blood, along with that also causing gallbladder problems. The patient was seen by Dr. Nicolasa Ducking of psychiatry, who suggested psychosis and depression in the patient and started her on Risperdal along with Celexa. The patient did not want any inpatient therapy and will follow up with  psychiatry as outpatient.  3. Dehydration, hyponatremia secondary to abdominal pain, decreased intake: Had dehydration and sodium low at 121, but this is improving back to a normal range. Also the patient's hydrochlorothiazide has been discontinued secondary to the hyponatremia and hypokalemia. The patient will follow up with her primary care physician in a week and get blood work to check her basic metabolic panel.   On the day of discharge the patient does not have chest pain or shortness of breath, continues to mention the strong body odor causing multiple problems with her body. Vital signs are in the normal range and the patient is being discharged home in a stable condition within normal abdominal exam.   DISCHARGE MEDICATIONS:  1. Fish oil 1000 mg oral capsule once a day.  2. Levothyroxine 75 mcg oral once a day.  3. Protonix 40 mg oral once a day.  4. Docusate sodium 100 mg oral once a day.  5. Ciprofloxacin 250 mg oral twice a day. The patient was on ciprofloxacin as outpatient.  6. Citalopram 20 mg oral once a day.  7. Risperidone 1 mg oral once a day at bedtime.   DISCHARGE INSTRUCTIONS: Follow up with primary care physician in a week. Use medications as directed.  The patient has been set up with outpatient psychiatry for followup. The patient will be on a cardiac diet with activity as tolerated. She is to return to the Emergency Room if she has any worsening of symptoms. She will need repeat basic metabolic panel in a week at the time of followup with her primary care physician.   TIME SPENT: Time spent today on this discharge dictation along with coordinating care and counseling of the  patient was 40 minutes.  ____________________________ [name], MD srs:bjt D:  04/05/2012 12:52:46 ET          T: 04/05/2012 13:09:21 ET        JOB#: [id number]  cc: Bethena Roys. Ancil Boozer, MD Neita Carp MD ELECTRONICALLY SIGNED 04/07/2012 7:57

## 2015-01-12 NOTE — Op Note (Signed)
PATIENT NAME:  Betty Nielsen, Betty Nielsen MR#:  [id number] DATE OF BIRTH:  01/12/1946  DATE OF PROCEDURE:  03/08/2012  PREOPERATIVE DIAGNOSIS: Ventral hernia.   POSTOPERATIVE DIAGNOSIS: Ventral hernia.   PROCEDURE: Ventral hernia repair.   SURGEON: Loreli Dollar, MD  ANESTHESIA: General.   INDICATIONS: This 69 year old female had a chief complaint of a bulge in the mid abdomen. She had had a previous open abdominal aortic aneurysm repair in 2009. Had an umbilical hernia fixed at that time but recently developed bulging in the epigastrium slightly to the left of the midline about an inch or so cephalad to the umbilicus. She had moderate discomfort. She had a palpable reducible ventral hernia. The bulge on exam was approximately 5 to 6 cm. Surgery was recommended for definitive treatment.   DESCRIPTION OF PROCEDURE: The patient was placed on the operating table in the supine position under general endotracheal anesthesia. The abdomen was prepared with ChloraPrep, draped in a sterile manner.   There was a somewhat wide scar at the site of the hernia. The scar itself was hypertrophied and approximately 2.5 cm wide. An elliptical excision was carried out which was slightly oblique and mostly longitudinal. This incision was approximately 6 cm in length. It was carried down through the full thickness of the scar into subcutaneous tissues and completely excised the scar. Next, there was a ventral hernia sac found which was grasped with Kelly clamp and elevated. The sac was dissected free from surrounding structures with blunt and sharp dissection. Numerous small bleeding points were cauterized. The fascial ring defect was encountered and the sac was dissected away from the fascial ring defect circumferentially and dissected back away from the fascial edges approximately 2 cm in all directions. A portion of the sac was excised and the wound was repaired with a running 4-0 Vicryl suture. Next, an Atrium mesh was cut  to create an oval shape of some 2.5 x 4 cm in dimension, was placed into the properitoneal plane and a transversely oriented. It was sutured to the overlying fascia along the edges of the mesh circumferentially with interrupted 0 Surgilon simple and figure-of-eight sutures. Next, the repair was further carried out with a transversely oriented suture line of interrupted 0 Surgilon figure-of-eight sutures incorporating mesh into each suture. The repair looked good. Subcutaneous tissues were then closed with a 4-0 Vicryl pursestring suture. Next, the skin was closed with a running 5-0 Monocryl subcuticular suture and Dermabond. The patient tolerated the procedure satisfactorily and was then prepared for transfer to the recovery room.  ____________________________ Lenna Sciara. Rochel Brome, MD jws:cms D: 03/08/2012 10:53:22 ET T: 03/08/2012 11:10:38 ET JOB#: [id number]  cc: Loreli Dollar, MD, <Dictator> Loreli Dollar MD ELECTRONICALLY SIGNED 03/09/2012 13:08

## 2015-05-23 ENCOUNTER — Other Ambulatory Visit: Payer: Self-pay | Admitting: Family Medicine

## 2015-05-23 NOTE — Telephone Encounter (Signed)
Patient requesting refill. 

## 2015-06-05 ENCOUNTER — Ambulatory Visit (INDEPENDENT_AMBULATORY_CARE_PROVIDER_SITE_OTHER): Payer: Medicare Other | Admitting: Family Medicine

## 2015-06-05 ENCOUNTER — Encounter: Payer: Self-pay | Admitting: Family Medicine

## 2015-06-05 VITALS — BP 138/86 | HR 64 | Temp 98.1°F | Resp 16 | Ht 66.0 in | Wt 202.8 lb

## 2015-06-05 DIAGNOSIS — I150 Renovascular hypertension: Secondary | ICD-10-CM | POA: Insufficient documentation

## 2015-06-05 DIAGNOSIS — J309 Allergic rhinitis, unspecified: Secondary | ICD-10-CM

## 2015-06-05 DIAGNOSIS — N183 Chronic kidney disease, stage 3 unspecified: Secondary | ICD-10-CM

## 2015-06-05 DIAGNOSIS — I701 Atherosclerosis of renal artery: Secondary | ICD-10-CM | POA: Insufficient documentation

## 2015-06-05 DIAGNOSIS — K219 Gastro-esophageal reflux disease without esophagitis: Secondary | ICD-10-CM | POA: Diagnosis not present

## 2015-06-05 DIAGNOSIS — E890 Postprocedural hypothyroidism: Secondary | ICD-10-CM | POA: Insufficient documentation

## 2015-06-05 DIAGNOSIS — J302 Other seasonal allergic rhinitis: Secondary | ICD-10-CM | POA: Insufficient documentation

## 2015-06-05 DIAGNOSIS — G629 Polyneuropathy, unspecified: Secondary | ICD-10-CM | POA: Diagnosis not present

## 2015-06-05 DIAGNOSIS — E669 Obesity, unspecified: Secondary | ICD-10-CM | POA: Diagnosis not present

## 2015-06-05 DIAGNOSIS — E785 Hyperlipidemia, unspecified: Secondary | ICD-10-CM | POA: Diagnosis not present

## 2015-06-05 DIAGNOSIS — Z23 Encounter for immunization: Secondary | ICD-10-CM | POA: Diagnosis not present

## 2015-06-05 DIAGNOSIS — F325 Major depressive disorder, single episode, in full remission: Secondary | ICD-10-CM | POA: Insufficient documentation

## 2015-06-05 DIAGNOSIS — F329 Major depressive disorder, single episode, unspecified: Secondary | ICD-10-CM

## 2015-06-05 DIAGNOSIS — I10 Essential (primary) hypertension: Secondary | ICD-10-CM

## 2015-06-05 DIAGNOSIS — D638 Anemia in other chronic diseases classified elsewhere: Secondary | ICD-10-CM | POA: Insufficient documentation

## 2015-06-05 DIAGNOSIS — F32A Depression, unspecified: Secondary | ICD-10-CM

## 2015-06-05 DIAGNOSIS — J3089 Other allergic rhinitis: Secondary | ICD-10-CM | POA: Insufficient documentation

## 2015-06-05 MED ORDER — METOPROLOL TARTRATE 25 MG PO TABS: 25.0000 mg | ORAL_TABLET | Freq: Two times a day (BID) | ORAL | Status: DC

## 2015-06-05 MED ORDER — CITALOPRAM HYDROBROMIDE 20 MG PO TABS: 20.0000 mg | ORAL_TABLET | Freq: Every day | ORAL | Status: DC

## 2015-06-05 MED ORDER — FLUTICASONE PROPIONATE 50 MCG/ACT NA SUSP: 2.0000 | Freq: Every day | NASAL | Status: DC

## 2015-06-05 MED ORDER — IRBESARTAN 300 MG PO TABS: 300.0000 mg | ORAL_TABLET | Freq: Every day | ORAL | Status: DC

## 2015-06-05 MED ORDER — PANTOPRAZOLE SODIUM 40 MG PO TBEC: 40.0000 mg | DELAYED_RELEASE_TABLET | Freq: Every day | ORAL | Status: DC

## 2015-06-05 MED ORDER — CLONIDINE HCL 0.1 MG PO TABS: 0.1000 mg | ORAL_TABLET | Freq: Two times a day (BID) | ORAL | Status: DC

## 2015-06-05 NOTE — Progress Notes (Signed)
Name: Betty Nielsen   MRN: [id number]    DOB: December 18, 1945   Date:06/05/2015       Progress Note  Subjective  Chief Complaint  Chief Complaint  Patient presents with  . Medication Refill    3 month F/U  . Hypertension    Dr. Clayborn Bigness changed medication and has been feeling better,   . Allergic Rhinitis     Improving  . Hypothyroidism    Well Controlled  . Gastrophageal Reflux    Well Controlled    HPI  HTN: patient has been taking medications as prescribed and is doing well, bp is at goal, also states the combination of medications helps her feel better. No chest pain, no palpitation, no SOB  Hyperlipidemia: LDL dangerously high, but patient is not willing to try statin, too scared of side effects .   AR: she has been taking Cetirizine and symptoms has been under control, uses nasal spray occasionally for nasal congestion. Symptoms are worse in the Spring  Hypothyroidism: she is s/p thyroidectomy and sees Dr. Pryor Ochoa - he monitors her TSH levels. Compliant with medication, denies dry skin, states constipation has been controlled with Miralax  Neuropathy: she has noticed intermittent tingling on both legs for the past few weeks, she states is mild and intermittent and does not want to have labs done at this time. She denies back pain, it can happen during rest or activity. No pain  GERD: under control at this time  Depression: taking Celexa, she states she is in remission at this time   Patient Active Problem List   Diagnosis Date Noted  . Anemia of chronic disease 06/05/2015  . Acid reflux 06/05/2015  . HLD (hyperlipidemia) 06/05/2015  . Hypertension, benign 06/05/2015  . Hypothyroidism, 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  . Chronic kidney disease 06/05/2015  . Depression 06/05/2015  . Obesity (BMI 30.0-34.9) 06/05/2015  . Neuropathy 06/05/2015  . Perennial allergic rhinitis 06/05/2015  . Arthritis, degenerative 07/03/2014  . AAA (abdominal aortic aneurysm) without rupture  06/26/2014    Past Surgical History  Procedure Laterality Date  . Abdominal aortic aneurysm repair    . Thyroidectomy    . Inner ear surgery      pt not sure of type  . Abdominal hysterectomy    . Appendectomy    . Hernia repair      Family History  Problem Relation Age of Onset  . Hypertension Sister     Social History   Social History  . Marital Status: Married    Spouse Name: N/A  . Number of Children: N/A  . Years of Education: N/A   Occupational History  . Not on file.   Social History Main Topics  . Smoking status: Former Research scientist (life sciences)  . Smokeless tobacco: Never Used  . Alcohol Use: No  . Drug Use: No  . Sexual Activity: Not Currently   Other Topics Concern  . Not on file   Social History Narrative     Current outpatient prescriptions:  .  acetaminophen (TYLENOL ARTHRITIS PAIN) 650 MG CR tablet, Take 650 mg by mouth every 8 (eight) hours as needed for pain., Disp: , Rfl:  .  aspirin 81 MG tablet, Take 81 mg by mouth daily., Disp: , Rfl:  .  cetirizine (ZYRTEC) 10 MG tablet, Take 10 mg by mouth daily., Disp: , Rfl:  .  citalopram (CELEXA) 20 MG tablet, Take 1 tablet (20 mg total) by mouth daily., Disp: 30 tablet, Rfl: 5 .  cloNIDine (CATAPRES) 0.1  MG tablet, Take 1 tablet (0.1 mg total) by mouth 2 (two) times daily., Disp: 60 tablet, Rfl: 5 .  docusate sodium (COLACE) 100 MG capsule, Take 100 mg by mouth 2 (two) times daily., Disp: , Rfl:  .  GARLIC PO, Take 1 capsule by mouth daily., Disp: , Rfl:  .  irbesartan (AVAPRO) 300 MG tablet, Take 1 tablet (300 mg total) by mouth daily., Disp: 30 tablet, Rfl: 5 .  levothyroxine (SYNTHROID, LEVOTHROID) 75 MCG tablet, Take 75 mcg by mouth. 1 tablet daily and 1/2 tablet on Sundays until f/u, Disp: , Rfl:  .  metoprolol tartrate (LOPRESSOR) 25 MG tablet, Take 1 tablet (25 mg total) by mouth 2 (two) times daily., Disp: 60 tablet, Rfl: 5 .  Omega-3 Fatty Acids (FISH OIL PO), Take 1 capsule by mouth daily., Disp: , Rfl:  .   pantoprazole (PROTONIX) 40 MG tablet, Take 1 tablet (40 mg total) by mouth daily., Disp: 90 tablet, Rfl: 5 .  fluticasone (FLONASE) 50 MCG/ACT nasal spray, Place 2 sprays into both nostrils daily., Disp: 16 g, Rfl: 1  Allergies  Allergen Reactions  . Ace Inhibitors Cough  . Ciprofloxacin Other (See Comments)  . Hydrocodone-Acetaminophen Other (See Comments)  . Norvasc [Amlodipine Besylate]     Pruritus   . Pantoprazole Sodium Other (See Comments)    Cramps  . Pravastatin Other (See Comments)    Stomach cramps  . Sulfa Antibiotics      ROS  Constitutional: Negative for fever or weight change.  Respiratory: Negative for cough and shortness of breath.   Cardiovascular: Negative for chest pain or palpitations.  Gastrointestinal: Negative for abdominal pain, no bowel changes.  Musculoskeletal: Negative for gait problem or joint swelling.  Skin: Negative for rash.  Neurological: Negative for dizziness or headache.  No other specific complaints in a complete review of systems (except as listed in HPI above).  Objective  Filed Vitals:   06/05/15 1346  BP: 138/86  Pulse: 64  Temp: 98.1 F (36.7 C)  TempSrc: Oral  Resp: 16  Height: 5\' 6"  (1.676 m)  Weight: 202 lb 12.8 oz (91.989 kg)  SpO2: 98%    Body mass index is 32.75 kg/(m^2).  Physical Exam  Constitutional: Patient appears well-developed and well-nourished. ObeseNo distress.  HEENT: head atraumatic, normocephalic, pupils equal and reactive to light, , neck supple, throat within normal limits Cardiovascular: Normal rate, regular rhythm and normal heart sounds.  No murmur heard. No BLE edema. Normal PD and normal monofilament test Pulmonary/Chest: Effort normal and breath sounds normal. No respiratory distress. Abdominal: Soft.  There is no tenderness. Psychiatric: Patient has a normal mood and affect. behavior is normal. Judgment and thought content normal.   PHQ2/9: Depression screen PHQ 2/9 06/05/2015  Decreased  Interest 0  Down, Depressed, Hopeless 0  PHQ - 2 Score 0     Fall Risk: Fall Risk  06/05/2015  Falls in the past year? 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 Hypertension, benign Continue medication , at  goal  - metoprolol tartrate (LOPRESSOR) 25 MG tablet; Take 1 tablet (25 mg total) by mouth 2 (two) times daily.  Dispense: 60 tablet; Refill: 5 - irbesartan (AVAPRO) 300 MG tablet; Take 1 tablet (300 mg total) by mouth daily.  Dispense: 30 tablet; Refill: 5 - cloNIDine (CATAPRES) 0.1 MG tablet; Take 1 tablet (0.1 mg total) by mouth 2 (two) times daily.  Dispense: 60 tablet; Refill: 5  2. Needs flu shot  - Flu vaccine HIGH DOSE PF (Fluzone High dose)  3. Chronic kidney disease, stage 3 (moderate) Continue follow up with Dr. Abigail Butts  4. Obesity (BMI 30.0-34.9) Discussed with the patient the risk posed by an increased BMI. Discussed importance of portion control, calorie counting and at least 150 minutes of physical activity weekly. Avoid sweet beverages and drink more water. Eat at least 6 servings of fruit and vegetables daily  5. Neuropathy She wants to hold off on medication and does not want any labs or studies at this time  6. HLD (hyperlipidemia) Discussed risk of cardiovascular death without therapy   7. Gastroesophageal reflux disease without esophagitis  - pantoprazole (PROTONIX) 40 MG tablet; Take 1 tablet (40 mg total) by mouth daily.  Dispense: 90  tablet; Refill: 5  8. Depression  - citalopram (CELEXA) 20 MG tablet; Take 1 tablet (20 mg total) by mouth daily.  Dispense: 30 tablet; Refill: 5  9. Perennial allergic rhinitis  - fluticasone (FLONASE) 50 MCG/ACT nasal spray; Place 2 sprays into both nostrils daily.  Dispense: 16 g; Refill: 1

## 2015-11-21 ENCOUNTER — Ambulatory Visit (INDEPENDENT_AMBULATORY_CARE_PROVIDER_SITE_OTHER): Payer: Medicare Other | Admitting: Family Medicine

## 2015-11-21 ENCOUNTER — Encounter: Payer: Self-pay | Admitting: Family Medicine

## 2015-11-21 VITALS — BP 112/72 | HR 65 | Temp 97.6°F | Resp 12 | Ht 64.0 in | Wt 208.2 lb

## 2015-11-21 DIAGNOSIS — E785 Hyperlipidemia, unspecified: Secondary | ICD-10-CM

## 2015-11-21 DIAGNOSIS — I700 Atherosclerosis of aorta: Secondary | ICD-10-CM | POA: Insufficient documentation

## 2015-11-21 DIAGNOSIS — N183 Chronic kidney disease, stage 3 unspecified: Secondary | ICD-10-CM

## 2015-11-21 DIAGNOSIS — E890 Postprocedural hypothyroidism: Secondary | ICD-10-CM | POA: Diagnosis not present

## 2015-11-21 DIAGNOSIS — I714 Abdominal aortic aneurysm, without rupture, unspecified: Secondary | ICD-10-CM

## 2015-11-21 DIAGNOSIS — F325 Major depressive disorder, single episode, in full remission: Secondary | ICD-10-CM

## 2015-11-21 DIAGNOSIS — I10 Essential (primary) hypertension: Secondary | ICD-10-CM

## 2015-11-21 DIAGNOSIS — D638 Anemia in other chronic diseases classified elsewhere: Secondary | ICD-10-CM

## 2015-11-21 DIAGNOSIS — Z23 Encounter for immunization: Secondary | ICD-10-CM | POA: Diagnosis not present

## 2015-11-21 DIAGNOSIS — G629 Polyneuropathy, unspecified: Secondary | ICD-10-CM

## 2015-11-21 DIAGNOSIS — K219 Gastro-esophageal reflux disease without esophagitis: Secondary | ICD-10-CM

## 2015-11-21 MED ORDER — CITALOPRAM HYDROBROMIDE 20 MG PO TABS: 20.0000 mg | ORAL_TABLET | Freq: Every day | ORAL | Status: DC

## 2015-11-21 MED ORDER — PANTOPRAZOLE SODIUM 40 MG PO TBEC: 40.0000 mg | DELAYED_RELEASE_TABLET | Freq: Every day | ORAL | Status: DC

## 2015-11-21 NOTE — Progress Notes (Signed)
Name: Betty Nielsen   MRN: [id number]    DOB: 21-May-1946   Date:11/21/2015       Progress Note  Subjective  Chief Complaint  Chief Complaint  Patient presents with  . Hypertension    patient is here for her 39-month f/u. Dr. Juleen China has taken over her BP meds.  . Medication Refill  . Labs Only  . Hypothyroidism    Dr. Pryor Ochoa changed her thyroid medication to 1/day    HPI  HTN: patient has been taking medications as prescribed and is doing well, bp is at goal. No chest pain, no palpitation, no SOB  Hyperlipidemia: LDL dangerously high, but patient is not willing to try statin, too scared of side effects . She also has atherosclerosis of abdominal aorta, discussed risk of strokes and heart attacks but she does not want statin therapy, but has been taking aspirin daily   Hypothyroidism: she is s/p thyroidectomy was having labs done by Dr. Pryor Ochoa, but no labs recently and she would like to have it done today. . Compliant with medication, denies dry skin, states constipation has been controlled with Miralax  Neuropathy: symptoms have resolved, not on medications, occasional cramping sensation but not currently  History of Gastroenteritis: symptoms happened about one month ago, she was sick for over one week but back to normal now.   GERD: under control at this time, no heartburn or regurgitation  Depression: taking Celexa, she states she is in remission at this time. 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   Patient Active Problem List   Diagnosis Date Noted  . 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[place]) 11/21/2015  . Anemia of chronic disease 06/05/2015  . Acid reflux 06/05/2015  . HLD (hyperlipidemia) 06/05/2015  . Hypertension, benign 06/05/2015  . Hypothyroidism, 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  . Chronic kidney disease 06/05/2015  . Major depression in remission ([place]) 06/05/2015  . Obesity (BMI 30.0-34.9) 06/05/2015  . Neuropathy ([place]) 06/05/2015  . Perennial allergic rhinitis  06/05/2015  . Arthritis, degenerative 07/03/2014  . AAA (abdominal aortic aneurysm) without rupture ([place]) 06/26/2014    Past Surgical History  Procedure Laterality Date  . Abdominal aortic aneurysm repair    . Thyroidectomy    . Inner ear surgery      pt not sure of type  . Abdominal hysterectomy    . Appendectomy    . Hernia repair      Family History  Problem Relation Age of Onset  . Hypertension Sister     Social History   Social History  . Marital Status: Married    Spouse Name: N/A  . Number of Children: N/A  . Years of Education: N/A   Occupational History  . Not on file.   Social History Main Topics  . Smoking status: Former Research scientist (life sciences)  . Smokeless tobacco: Never Used  . Alcohol Use: No  . Drug Use: No  . Sexual Activity: Not Currently   Other Topics Concern  . Not on file   Social History Narrative     Current outpatient prescriptions:  .  acetaminophen (TYLENOL ARTHRITIS PAIN) 650 MG CR tablet, Take 650 mg by mouth every 8 (eight) hours as needed for pain., Disp: , Rfl:  .  aspirin 81 MG tablet, Take 81 mg by mouth daily., Disp: , Rfl:  .  cetirizine (ZYRTEC) 10 MG tablet, Take 10 mg by mouth daily., Disp: , Rfl:  .  citalopram (CELEXA) 20 MG tablet, Take 1 tablet (20 mg total) by  mouth daily., Disp: 30 tablet, Rfl: 5 .  cloNIDine (CATAPRES) 0.1 MG tablet, Take 1 tablet (0.1 mg total) by mouth 2 (two) times daily., Disp: 60 tablet, Rfl: 5 .  docusate sodium (COLACE) 100 MG capsule, Take 100 mg by mouth 2 (two) times daily., Disp: , Rfl:  .  fluticasone (FLONASE) 50 MCG/ACT nasal spray, Place 2 sprays into both nostrils daily., Disp: 16 g, Rfl: 1 .  GARLIC PO, Take 1 capsule by mouth daily., Disp: , Rfl:  .  irbesartan (AVAPRO) 300 MG tablet, Take 1 tablet (300 mg total) by mouth daily., Disp: 30 tablet, Rfl: 5 .  labetalol (NORMODYNE) 100 MG tablet, , Disp: , Rfl:  .  levothyroxine (SYNTHROID, LEVOTHROID) 75 MCG tablet, Take 75 mcg by mouth. 1 tablet  daily and 1/2 tablet on Sundays until f/u, Disp: , Rfl:  .  Omega-3 Fatty Acids (FISH OIL PO), Take 1 capsule by mouth daily., Disp: , Rfl:  .  pantoprazole (PROTONIX) 40 MG tablet, Take 1 tablet (40 mg total) by mouth daily., Disp: 90 tablet, Rfl: 5  Allergies  Allergen Reactions  . Ace Inhibitors Cough  . Ciprofloxacin Other (See Comments)  . Hydrocodone-Acetaminophen Other (See Comments)  . Norvasc [Amlodipine Besylate]     Pruritus   . Pantoprazole Sodium Other (See Comments)    Cramps  . Pravastatin Other (See Comments)    Stomach cramps  . Sulfa Antibiotics      ROS  Constitutional: Negative for fever or weight change.  Respiratory: Negative for cough and shortness of breath.   Cardiovascular: Negative for chest pain or palpitations.  Gastrointestinal: Negative for abdominal pain, no bowel changes.  Musculoskeletal: Negative for gait problem or joint swelling.  Skin: Negative for rash.  Neurological: Negative for dizziness or headache.  No other specific complaints in a complete review of systems (except as listed in HPI above).  Objective  Filed Vitals:   11/21/15 0809  BP: 112/72  Pulse: 65  Temp: 97.6 F (36.4 C)  TempSrc: Oral  Resp: 12  Height: 5\' 4"  (1.626 m)  Weight: 208 lb 3.2 oz (94.439 kg)  SpO2: 97%    Body mass index is 35.72 kg/(m^2).  Physical Exam  Constitutional: Patient appears well-developed and well-nourished. Obese  No distress.  HEENT: head atraumatic, normocephalic, pupils equal and reactive to light, neck supple, throat within normal limits Cardiovascular: Normal rate, regular rhythm and normal heart sounds.  No murmur heard. No BLE edema. Pulmonary/Chest: Effort normal and breath sounds normal. No respiratory distress. Abdominal: Soft.  There is no tenderness. Psychiatric: Patient has a normal mood and affect. behavior is normal. Judgment and thought content normal.    PHQ2/9: Depression screen [facility] 2/9 11/21/2015 10/08/2015  06/05/2015  Decreased Interest 0 0 0  Down, Depressed, Hopeless 0 0 0  PHQ - 2 Score 0 0 0    Fall Risk: Fall Risk  11/21/2015 10/08/2015 06/05/2015  Falls in the past year? No No No    Functional Status Survey: Is the patient deaf or have difficulty hearing?: Yes Does the patient have difficulty seeing, even when wearing glasses/contacts?: No Does the patient have difficulty concentrating, remembering, or making decisions?: No Does the patient have difficulty walking or climbing stairs?: No Does the patient have difficulty dressing or bathing?: No Does the patient have difficulty doing errands alone such as visiting a doctor's office or shopping?: No    Assessment & Plan  1. Hypertension, benign  Well controlled, no side effects of  medication   2. Chronic kidney disease, stage 3 (moderate)  Continue follow up with Dr. Abigail Butts  3. Neuropathy (HCC)  Resolved, not on medication at this time  4. HLD (hyperlipidemia)  Refuses medication or labs at this time  5. Gastroesophageal reflux disease without esophagitis  - pantoprazole (PROTONIX) 40 MG tablet; Take 1 tablet (40 mg total) by mouth daily.  Dispense: 90 tablet; Refill: 5  6. Hypothyroidism, postsurgical  - TSH  7. AAA (abdominal aortic aneurysm) without rupture ([place])  Due for repeat in October, but she refuses at this time  8. Anemia of chronic disease  Had labs done recently by Dr. Abigail Butts  9. 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HCC)  Continue aspirin, refuses statin therapy  10. Major depression in remission ([facility])  - citalopram (CELEXA) 20 MG tablet; Take 1 tablet (20 mg total) by mouth daily.  Dispense: 30 tablet; Refill: 5  11. Need for vaccination for Strep pneumoniae  She wants to have it done at pharmacy to decrease her cost

## 2015-11-22 LAB — TSH: TSH: 2.34 u[IU]/mL (ref 0.450–4.500)

## 2015-11-23 ENCOUNTER — Other Ambulatory Visit: Payer: Self-pay | Admitting: Family Medicine

## 2015-11-23 MED ORDER — LEVOTHYROXINE SODIUM 75 MCG PO TABS: 75.0000 ug | ORAL_TABLET | Freq: Every day | ORAL | Status: DC

## 2015-12-12 ENCOUNTER — Telehealth: Payer: Self-pay | Admitting: Family Medicine

## 2015-12-12 DIAGNOSIS — F325 Major depressive disorder, single episode, in full remission: Secondary | ICD-10-CM

## 2015-12-12 DIAGNOSIS — K219 Gastro-esophageal reflux disease without esophagitis: Secondary | ICD-10-CM

## 2015-12-12 NOTE — Telephone Encounter (Signed)
The levothyroxine medication was sent 03/07 to [facility], what other medication does she need?

## 2015-12-12 NOTE — Telephone Encounter (Signed)
Pt states she needs a refill on all of her medications to be sent to [address]. Pt states she has had her labs done

## 2015-12-12 NOTE — Telephone Encounter (Signed)
Patient requesting refill. 

## 2015-12-12 NOTE — Telephone Encounter (Signed)
Left a message to notify the patient we need to know which medications she needs refill and that Dr. Ancil Boozer sent in her thyroid medication on 11/25/15 to [facility].

## 2015-12-15 MED ORDER — PANTOPRAZOLE SODIUM 40 MG PO TBEC: 40.0000 mg | DELAYED_RELEASE_TABLET | Freq: Every day | ORAL | Status: DC

## 2015-12-15 MED ORDER — CITALOPRAM HYDROBROMIDE 20 MG PO TABS: 20.0000 mg | ORAL_TABLET | Freq: Every day | ORAL | Status: DC

## 2015-12-15 MED ORDER — LEVOTHYROXINE SODIUM 75 MCG PO TABS: 75.0000 ug | ORAL_TABLET | Freq: Every day | ORAL | Status: DC

## 2015-12-15 NOTE — Telephone Encounter (Signed)
Sent it again today

## 2015-12-15 NOTE — Telephone Encounter (Signed)
Patient called the pharmacy and they did not receive any of her refills. Please call in citalopram, pantoprazole and levothyroxine to [facility] [address]

## 2016-05-26 ENCOUNTER — Telehealth: Payer: Self-pay | Admitting: Family Medicine

## 2016-05-26 ENCOUNTER — Other Ambulatory Visit: Payer: Self-pay | Admitting: Family Medicine

## 2016-05-26 DIAGNOSIS — K219 Gastro-esophageal reflux disease without esophagitis: Secondary | ICD-10-CM

## 2016-05-26 DIAGNOSIS — F325 Major depressive disorder, single episode, in full remission: Secondary | ICD-10-CM

## 2016-05-26 MED ORDER — PANTOPRAZOLE SODIUM 40 MG PO TBEC: 40.0000 mg | DELAYED_RELEASE_TABLET | Freq: Every day | ORAL | 1 refills | Status: DC

## 2016-05-26 MED ORDER — LEVOTHYROXINE SODIUM 75 MCG PO TABS: 75.0000 ug | ORAL_TABLET | Freq: Every day | ORAL | 1 refills | Status: DC

## 2016-05-26 MED ORDER — CITALOPRAM HYDROBROMIDE 20 MG PO TABS: 20.0000 mg | ORAL_TABLET | Freq: Every day | ORAL | 1 refills | Status: DC

## 2016-05-26 NOTE — Telephone Encounter (Signed)
Please verify if she has been taking clonidine and avapro. Sent citalopram, levothyroxine and a bp medication

## 2016-05-28 ENCOUNTER — Ambulatory Visit: Payer: Medicare Other | Admitting: Family Medicine

## 2016-05-28 NOTE — Telephone Encounter (Signed)
Pt.notified

## 2016-06-15 ENCOUNTER — Ambulatory Visit (INDEPENDENT_AMBULATORY_CARE_PROVIDER_SITE_OTHER): Payer: Medicare Other | Admitting: Family Medicine

## 2016-06-15 ENCOUNTER — Encounter: Payer: Self-pay | Admitting: Family Medicine

## 2016-06-15 VITALS — BP 122/68 | HR 98 | Temp 98.0°F | Wt 202.2 lb

## 2016-06-15 DIAGNOSIS — I714 Abdominal aortic aneurysm, without rupture, unspecified: Secondary | ICD-10-CM

## 2016-06-15 DIAGNOSIS — I700 Atherosclerosis of aorta: Secondary | ICD-10-CM | POA: Diagnosis not present

## 2016-06-15 DIAGNOSIS — N183 Chronic kidney disease, stage 3 unspecified: Secondary | ICD-10-CM

## 2016-06-15 DIAGNOSIS — M159 Polyosteoarthritis, unspecified: Secondary | ICD-10-CM

## 2016-06-15 DIAGNOSIS — F325 Major depressive disorder, single episode, in full remission: Secondary | ICD-10-CM

## 2016-06-15 DIAGNOSIS — E785 Hyperlipidemia, unspecified: Secondary | ICD-10-CM | POA: Diagnosis not present

## 2016-06-15 DIAGNOSIS — D638 Anemia in other chronic diseases classified elsewhere: Secondary | ICD-10-CM

## 2016-06-15 DIAGNOSIS — M150 Primary generalized (osteo)arthritis: Secondary | ICD-10-CM

## 2016-06-15 DIAGNOSIS — I10 Essential (primary) hypertension: Secondary | ICD-10-CM

## 2016-06-15 DIAGNOSIS — E890 Postprocedural hypothyroidism: Secondary | ICD-10-CM

## 2016-06-15 DIAGNOSIS — H8110 Benign paroxysmal vertigo, unspecified ear: Secondary | ICD-10-CM | POA: Insufficient documentation

## 2016-06-15 DIAGNOSIS — H8111 Benign paroxysmal vertigo, right ear: Secondary | ICD-10-CM

## 2016-06-15 LAB — CBC WITH DIFFERENTIAL/PLATELET
BASOS PCT: 0 %
Basophils Absolute: 0 cells/uL (ref 0–200)
EOS PCT: 5 %
Eosinophils Absolute: 235 cells/uL (ref 15–500)
HCT: 32.6 % — ABNORMAL LOW (ref 35.0–45.0)
Hemoglobin: 10.7 g/dL — ABNORMAL LOW (ref 11.7–15.5)
LYMPHS PCT: 32 %
Lymphs Abs: 1504 cells/uL (ref 850–3900)
MCH: 29.4 pg (ref 27.0–33.0)
MCHC: 32.8 g/dL (ref 32.0–36.0)
MCV: 89.6 fL (ref 80.0–100.0)
MONO ABS: 423 {cells}/uL (ref 200–950)
MPV: 8.9 fL (ref 7.5–12.5)
Monocytes Relative: 9 %
NEUTROS PCT: 54 %
Neutro Abs: 2538 cells/uL (ref 1500–7800)
PLATELETS: 293 10*3/uL (ref 140–400)
RBC: 3.64 MIL/uL — AB (ref 3.80–5.10)
RDW: 13.4 % (ref 11.0–15.0)
WBC: 4.7 10*3/uL (ref 3.8–10.8)

## 2016-06-15 LAB — LIPID PANEL
CHOL/HDL RATIO: 5.1 ratio — AB (ref <=5.0)
Cholesterol: 321 mg/dL — ABNORMAL HIGH (ref 125–200)
HDL: 63 mg/dL (ref >=46)
LDL CALC: 233 mg/dL — AB (ref <130)
Triglycerides: 124 mg/dL (ref <150)
VLDL: 25 mg/dL (ref <30)

## 2016-06-15 LAB — COMPLETE METABOLIC PANEL WITH GFR
ALT: 8 U/L (ref 6–29)
AST: 17 U/L (ref 10–35)
Albumin: 3.8 g/dL (ref 3.6–5.1)
Alkaline Phosphatase: 71 U/L (ref 33–130)
BUN: 14 mg/dL (ref 7–25)
CHLORIDE: 99 mmol/L (ref 98–110)
CO2: 22 mmol/L (ref 20–31)
CREATININE: 1.25 mg/dL — AB (ref 0.60–0.93)
Calcium: 9.4 mg/dL (ref 8.6–10.4)
GFR, Est African American: 50 mL/min — ABNORMAL LOW (ref >=60)
GFR, Est Non African American: 44 mL/min — ABNORMAL LOW (ref >=60)
GLUCOSE: 93 mg/dL (ref 65–99)
POTASSIUM: 4.9 mmol/L (ref 3.5–5.3)
SODIUM: 131 mmol/L — AB (ref 135–146)
Total Bilirubin: 0.5 mg/dL (ref 0.2–1.2)
Total Protein: 6.3 g/dL (ref 6.1–8.1)

## 2016-06-15 LAB — TSH: TSH: 0.74 m[IU]/L

## 2016-06-15 MED ORDER — IRBESARTAN 300 MG PO TABS: 300.0000 mg | ORAL_TABLET | Freq: Every day | ORAL | 1 refills | Status: DC

## 2016-06-15 MED ORDER — MECLIZINE HCL 12.5 MG PO TABS: 12.5000 mg | ORAL_TABLET | Freq: Three times a day (TID) | ORAL | 0 refills | Status: DC | PRN

## 2016-06-15 MED ORDER — LABETALOL HCL 100 MG PO TABS: 100.0000 mg | ORAL_TABLET | Freq: Two times a day (BID) | ORAL | 1 refills | Status: DC

## 2016-06-15 MED ORDER — CLONIDINE HCL 0.1 MG PO TABS: 0.1000 mg | ORAL_TABLET | Freq: Two times a day (BID) | ORAL | 1 refills | Status: DC

## 2016-06-15 NOTE — Progress Notes (Signed)
Name: Betty Nielsen   MRN: [id number]    DOB: 1946/01/07   Date:06/15/2016       Progress Note  Subjective  Chief Complaint  Chief Complaint  Patient presents with  . Follow-up    HPI   HTN: patient has been taking medications as prescribed and is doing well, bp is at goal. No chest pain, no palpitation, no SOB  BPV: went to [facility]  and referred to ENT diagnosed with BPV, she had the Epley Maneuver, she still has intermittent symptoms about once a mouth, but resolves when she sits still, advised to go back to ENT but she wants to hold off for now. She would like a refill of Meclizinettt  Hyperlipidemia: LDL dangerously high, but patient is not willing to try statin, too scared of side effects . She also has atherosclerosis of abdominal aorta, discussed risk of strokes and heart attacks but she does not want statin therapy, but has been taking aspirin daily    Hypothyroidism: she is s/p thyroidectomy was having labs done by Dr. Pryor Ochoa, Compliant with medication, denies dry skin, states constipation has been controlled with Miralax. She is due for lab work today  GERD: under control at this time, no heartburn or regurgitation  Depression: taking Celexa, she states she is in remission at this time. 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 She denies sadness, crying spells, change in appetite, or suicidal thoughts.   OA : she has chronic pain on neck, shoulders, hip and knees, she takes Tylenol and it control symptoms, discussed PT , but she exercises /stretches at home. She has seen  Dr. Cristi Loron in the past    Patient Active Problem List   Diagnosis Date Noted  . BPV (benign positional vertigo) 06/15/2016  . 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[place]) 11/21/2015  . Anemia of chronic disease 06/05/2015  . Acid reflux 06/05/2015  . HLD (hyperlipidemia) 06/05/2015  . Hypertension, benign 06/05/2015  . Hypothyroidism, 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  . Chronic kidney  disease 06/05/2015  . Major depression in remission ([place]) 06/05/2015  . Obesity (BMI 30.0-34.9) 06/05/2015  . Neuropathy ([place]) 06/05/2015  . Perennial allergic rhinitis 06/05/2015  . Arthritis, degenerative 07/03/2014  . AAA (abdominal aortic aneurysm) without rupture ([place]) 06/26/2014    Past Surgical History:  Procedure Laterality Date  . ABDOMINAL AORTIC ANEURYSM REPAIR    . ABDOMINAL HYSTERECTOMY    . APPENDECTOMY    . HERNIA REPAIR    . INNER EAR SURGERY     pt not sure of type  . THYROIDECTOMY      Family History  Problem Relation Age of Onset  . Hypertension Sister     Social History   Social History  . Marital status: Married    Spouse name: N/A  . Number of children: N/A  . Years of education: N/A   Occupational History  . Not on file.   Social History Main Topics  . Smoking status: Former Research scientist (life sciences)  . Smokeless tobacco: Never Used  . Alcohol use No  . Drug use: No  . Sexual activity: Not Currently   Other Topics Concern  . Not on file   Social History Narrative  . No narrative on file     Current Outpatient Prescriptions:  .  acetaminophen (TYLENOL ARTHRITIS PAIN) 650 MG CR tablet, Take 650 mg by mouth every 8 (eight) hours as needed for pain., Disp: , Rfl:  .  aspirin 81 MG tablet, Take 81 mg  by mouth daily., Disp: , Rfl:  .  cetirizine (ZYRTEC) 10 MG tablet, Take 10 mg by mouth daily., Disp: , Rfl:  .  citalopram (CELEXA) 20 MG tablet, Take 1 tablet (20 mg total) by mouth daily., Disp: 90 tablet, Rfl: 1 .  GARLIC PO, Take 1 capsule by mouth daily., Disp: , Rfl:  .  irbesartan (AVAPRO) 300 MG tablet, Take 1 tablet (300 mg total) by mouth daily., Disp: 30 tablet, Rfl: 5 .  labetalol (NORMODYNE) 100 MG tablet, , Disp: , Rfl:  .  levothyroxine (SYNTHROID, LEVOTHROID) 75 MCG tablet, Take 1 tablet (75 mcg total) by mouth daily before breakfast. 1 tablet daily and 1/2 tablet on Sundays until f/u, Disp: 100 tablet, Rfl: 1 .  Omega-3 Fatty Acids (FISH OIL  PO), Take 1 capsule by mouth daily., Disp: , Rfl:  .  pantoprazole (PROTONIX) 40 MG tablet, Take 1 tablet (40 mg total) by mouth daily., Disp: 90 tablet, Rfl: 1 .  docusate sodium (COLACE) 100 MG capsule, Take 100 mg by mouth 2 (two) times daily., Disp: , Rfl:   Allergies  Allergen Reactions  . Ace Inhibitors Cough  . Ciprofloxacin Other (See Comments)  . Hydrocodone-Acetaminophen Other (See Comments)  . Norvasc [Amlodipine Besylate]     Pruritus   . Pantoprazole Sodium Other (See Comments)    Cramps  . Pravastatin Other (See Comments)    Stomach cramps  . Sulfa Antibiotics      ROS  Constitutional: Negative for fever or weight change.  Respiratory: Negative for cough and shortness of breath.   Cardiovascular: Negative for chest pain or palpitations.  Gastrointestinal: Negative for abdominal pain, no bowel changes.  Musculoskeletal: Positive  for gait problem - occasionally no  joint swelling.  Skin: Negative for rash.  Neurological: Positive  for intermittent dizziness no headache.  No other specific complaints in a complete review of systems (except as listed in HPI above).  Objective  Vitals:   06/15/16 0958  BP: 122/68  Pulse: 98  Temp: 98 F (36.7 C)  SpO2: 95%  Weight: 202 lb 3.2 oz (91.7 kg)    Body mass index is 34.71 kg/m.  Physical Exam  Constitutional: Patient appears well-developed and well-nourished. Obese  No distress.  HEENT: head atraumatic, normocephalic, pupils equal and reactive to light, neck supple, throat within normal limits Cardiovascular: Normal rate, regular rhythm and normal heart sounds.  No murmur heard. No BLE edema. Pulmonary/Chest: Effort normal and breath sounds normal. No respiratory distress. Abdominal: Soft.  There is no tenderness. Psychiatric: Patient has a normal mood and affect. behavior is normal. Judgment and thought content normal.  PHQ2/9: Depression screen [facility] 2/9 06/15/2016 11/21/2015 10/08/2015 06/05/2015  Decreased  Interest 0 0 0 0  Down, Depressed, Hopeless 0 0 0 0  PHQ - 2 Score 0 0 0 0     Fall Risk: Fall Risk  06/15/2016 11/21/2015 10/08/2015 06/05/2015  Falls in the past year? No No No No      Functional Status Survey: Is the patient deaf or have difficulty hearing?: No Does the patient have difficulty seeing, even when wearing glasses/contacts?: Yes (glasses) Does the patient have difficulty concentrating, remembering, or making decisions?: No Does the patient have difficulty walking or climbing stairs?: Yes Does the patient have difficulty dressing or bathing?: No Does the patient have difficulty doing errands alone such as visiting a doctor's office or shopping?: No    Assessment & Plan  1. Major depression in remission [facility])  Doing  well on Citalopram   2. Chronic kidney disease, stage 3 (moderate)  Recheck labs  3. Hypertension, benign  - irbesartan (AVAPRO) 300 MG tablet; Take 1 tablet (300 mg total) by mouth daily.  Dispense: 90 tablet; Refill: 1 - labetalol (NORMODYNE) 100 MG tablet; Take 1 tablet (100 mg total) by mouth 2 (two) times daily.  Dispense: 180 tablet; Refill: 1 - cloNIDine (CATAPRES) 0.1 MG tablet; Take 1 tablet (0.1 mg total) by mouth 2 (two) times daily.  Dispense: 180 tablet; Refill: 1 - COMPLETE METABOLIC PANEL WITH GFR  4. HLD (hyperlipidemia)  - Lipid panel  5. Hypothyroidism, postsurgical  - TSH  6. AAA (abdominal aortic aneurysm) without rupture ([facility])  Off statin, refuses  7. Anemia of chronic disease  - CBC with Differential/Platelet  8. 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[place])  Needs to take statins, but refuses  9. BPV (benign positional vertigo), right  - meclizine (ANTIVERT) 12.5 MG tablet; Take 1 tablet (12.5 mg total) by mouth 3 (three) times daily as needed for dizziness.  Dispense: 15 tablet; Refill: 0   10. Primary osteoarthritis involving multiple joints  Seen by Dr. Jefm Bryant, stable on medication ( Tylenol otc )

## 2016-06-16 ENCOUNTER — Other Ambulatory Visit: Payer: Self-pay | Admitting: Family Medicine

## 2016-06-16 DIAGNOSIS — D649 Anemia, unspecified: Secondary | ICD-10-CM

## 2016-09-09 ENCOUNTER — Other Ambulatory Visit: Payer: Self-pay | Admitting: Family Medicine

## 2016-09-09 NOTE — Telephone Encounter (Signed)
PT SAID THAT HER PHARM CAN NOT GET HER THYROID MEDICATION  FROM THE MANUFACTORY THAT THEY USUALLY GET IT FROM AND NEEDS APPROVAL TO GET IT FROM ANOTHER SUPPLIER BUT HAS TO HAVE YOUR APPROVAL FIRST. SHE TOOK HER LAST ONE YESTERDAY.[place]

## 2016-09-10 NOTE — Telephone Encounter (Signed)
Approval was given to the pharmacist to proceed with refill as planned

## 2016-09-14 ENCOUNTER — Telehealth: Payer: Self-pay
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
## 2016-09-18 NOTE — Telephone Encounter (Signed)
Okay, and return in 6 weeks to recheck TSH level

## 2016-09-21 ENCOUNTER — Other Ambulatory Visit: Payer: Self-pay | Admitting: Family Medicine

## 2016-09-21 DIAGNOSIS — E890 Postprocedural hypothyroidism: Secondary | ICD-10-CM

## 2016-09-21 NOTE — Telephone Encounter (Signed)
Mailed lab slip to patient house.

## 2016-10-25 ENCOUNTER — Observation Stay
Admission: EM | Admit: 2016-10-25 | Discharge: 2016-10-27 | Disposition: A | Discharge status: Home / Self Care | Payer: Medicare Other | Attending: Internal Medicine | Admitting: Internal Medicine

## 2016-10-25 ENCOUNTER — Emergency Department: Payer: Medicare Other

## 2016-10-25 DIAGNOSIS — I252 Old myocardial infarction: Secondary | ICD-10-CM | POA: Insufficient documentation

## 2016-10-25 DIAGNOSIS — I714 Abdominal aortic aneurysm, without rupture: Secondary | ICD-10-CM | POA: Insufficient documentation

## 2016-10-25 DIAGNOSIS — H9190 Unspecified hearing loss, unspecified ear: Secondary | ICD-10-CM | POA: Diagnosis not present

## 2016-10-25 DIAGNOSIS — R51 Headache: Secondary | ICD-10-CM

## 2016-10-25 DIAGNOSIS — E669 Obesity, unspecified: Secondary | ICD-10-CM | POA: Diagnosis not present

## 2016-10-25 DIAGNOSIS — R519 Headache, unspecified: Secondary | ICD-10-CM

## 2016-10-25 DIAGNOSIS — R7989 Other specified abnormal findings of blood chemistry: Secondary | ICD-10-CM

## 2016-10-25 DIAGNOSIS — I249 Acute ischemic heart disease, unspecified: Secondary | ICD-10-CM

## 2016-10-25 DIAGNOSIS — H8110 Benign paroxysmal vertigo, unspecified ear: Secondary | ICD-10-CM | POA: Diagnosis not present

## 2016-10-25 DIAGNOSIS — J309 Allergic rhinitis, unspecified: Secondary | ICD-10-CM | POA: Diagnosis not present

## 2016-10-25 DIAGNOSIS — Z8679 Personal history of other diseases of the circulatory system: Secondary | ICD-10-CM | POA: Insufficient documentation

## 2016-10-25 DIAGNOSIS — Z6834 Body mass index (BMI) 34.0-34.9, adult: Secondary | ICD-10-CM | POA: Diagnosis not present

## 2016-10-25 DIAGNOSIS — I25110 Atherosclerotic heart disease of native coronary artery with unstable angina pectoris: Principal | ICD-10-CM | POA: Insufficient documentation

## 2016-10-25 DIAGNOSIS — Z90710 Acquired absence of both cervix and uterus: Secondary | ICD-10-CM | POA: Diagnosis not present

## 2016-10-25 DIAGNOSIS — I700 Atherosclerosis of aorta: Secondary | ICD-10-CM | POA: Insufficient documentation

## 2016-10-25 DIAGNOSIS — G629 Polyneuropathy, unspecified: Secondary | ICD-10-CM | POA: Diagnosis not present

## 2016-10-25 DIAGNOSIS — E890 Postprocedural hypothyroidism: Secondary | ICD-10-CM | POA: Diagnosis not present

## 2016-10-25 DIAGNOSIS — R778 Other specified abnormalities of plasma proteins: Secondary | ICD-10-CM | POA: Insufficient documentation

## 2016-10-25 DIAGNOSIS — I739 Peripheral vascular disease, unspecified: Secondary | ICD-10-CM | POA: Insufficient documentation

## 2016-10-25 DIAGNOSIS — Z885 Allergy status to narcotic agent status: Secondary | ICD-10-CM | POA: Insufficient documentation

## 2016-10-25 DIAGNOSIS — E871 Hypo-osmolality and hyponatremia: Secondary | ICD-10-CM

## 2016-10-25 DIAGNOSIS — E785 Hyperlipidemia, unspecified: Secondary | ICD-10-CM | POA: Diagnosis not present

## 2016-10-25 DIAGNOSIS — I1310 Hypertensive heart and chronic kidney disease without heart failure, with stage 1 through stage 4 chronic kidney disease, or unspecified chronic kidney disease: Secondary | ICD-10-CM | POA: Diagnosis not present

## 2016-10-25 DIAGNOSIS — Z87891 Personal history of nicotine dependence: Secondary | ICD-10-CM | POA: Insufficient documentation

## 2016-10-25 DIAGNOSIS — K219 Gastro-esophageal reflux disease without esophagitis: Secondary | ICD-10-CM | POA: Diagnosis not present

## 2016-10-25 DIAGNOSIS — D638 Anemia in other chronic diseases classified elsewhere: Secondary | ICD-10-CM | POA: Insufficient documentation

## 2016-10-25 DIAGNOSIS — Z882 Allergy status to sulfonamides status: Secondary | ICD-10-CM | POA: Insufficient documentation

## 2016-10-25 DIAGNOSIS — I209 Angina pectoris, unspecified: Secondary | ICD-10-CM | POA: Diagnosis present

## 2016-10-25 DIAGNOSIS — Z7902 Long term (current) use of antithrombotics/antiplatelets: Secondary | ICD-10-CM | POA: Insufficient documentation

## 2016-10-25 DIAGNOSIS — Z9861 Coronary angioplasty status: Secondary | ICD-10-CM | POA: Insufficient documentation

## 2016-10-25 DIAGNOSIS — E8881 Metabolic syndrome: Secondary | ICD-10-CM | POA: Insufficient documentation

## 2016-10-25 DIAGNOSIS — Z79899 Other long term (current) drug therapy: Secondary | ICD-10-CM | POA: Insufficient documentation

## 2016-10-25 DIAGNOSIS — N183 Chronic kidney disease, stage 3 unspecified: Secondary | ICD-10-CM

## 2016-10-25 DIAGNOSIS — F325 Major depressive disorder, single episode, in full remission: Secondary | ICD-10-CM | POA: Diagnosis not present

## 2016-10-25 DIAGNOSIS — Z7982 Long term (current) use of aspirin: Secondary | ICD-10-CM | POA: Insufficient documentation

## 2016-10-25 DIAGNOSIS — Z888 Allergy status to other drugs, medicaments and biological substances status: Secondary | ICD-10-CM | POA: Insufficient documentation

## 2016-10-25 DIAGNOSIS — I10 Essential (primary) hypertension: Secondary | ICD-10-CM

## 2016-10-25 DIAGNOSIS — M722 Plantar fascial fibromatosis: Secondary | ICD-10-CM | POA: Diagnosis not present

## 2016-10-25 DIAGNOSIS — M1990 Unspecified osteoarthritis, unspecified site: Secondary | ICD-10-CM | POA: Insufficient documentation

## 2016-10-25 DIAGNOSIS — Z881 Allergy status to other antibiotic agents status: Secondary | ICD-10-CM | POA: Insufficient documentation

## 2016-10-25 DIAGNOSIS — Z8249 Family history of ischemic heart disease and other diseases of the circulatory system: Secondary | ICD-10-CM | POA: Insufficient documentation

## 2016-10-25 LAB — CBC
HCT: 33.3 % — ABNORMAL LOW (ref 35.0–47.0)
Hemoglobin: 11.6 g/dL — ABNORMAL LOW (ref 12.0–16.0)
MCH: 31.1 pg (ref 26.0–34.0)
MCHC: 34.7 g/dL (ref 32.0–36.0)
MCV: 89.8 fL (ref 80.0–100.0)
PLATELETS: 310 10*3/uL (ref 150–440)
RBC: 3.71 MIL/uL — ABNORMAL LOW (ref 3.80–5.20)
RDW: 13.1 % (ref 11.5–14.5)
WBC: 5.5 10*3/uL (ref 3.6–11.0)

## 2016-10-25 LAB — BASIC METABOLIC PANEL
Anion gap: 8 (ref 5–15)
BUN: 16 mg/dL (ref 6–20)
CALCIUM: 9.2 mg/dL (ref 8.9–10.3)
CO2: 22 mmol/L (ref 22–32)
CREATININE: 1.53 mg/dL — AB (ref 0.44–1.00)
Chloride: 99 mmol/L — ABNORMAL LOW (ref 101–111)
GFR calc Af Amer: 39 mL/min — ABNORMAL LOW (ref >60)
GFR calc non Af Amer: 33 mL/min — ABNORMAL LOW (ref >60)
GLUCOSE: 127 mg/dL — AB (ref 65–99)
Potassium: 4 mmol/L (ref 3.5–5.1)
SODIUM: 129 mmol/L — AB (ref 135–145)

## 2016-10-25 LAB — PROTIME-INR
INR: 1
PROTHROMBIN TIME: 13.2 s (ref 11.4–15.2)

## 2016-10-25 LAB — TSH: TSH: 0.739 u[IU]/mL (ref 0.350–4.500)

## 2016-10-25 LAB — APTT: aPTT: 27 seconds (ref 24–36)

## 2016-10-25 LAB — TROPONIN I
TROPONIN I: 0.16 ng/mL — AB (ref <0.03)
TROPONIN I: 0.17 ng/mL — AB (ref <0.03)
Troponin I: 0.2 ng/mL (ref <0.03)

## 2016-10-25 IMAGING — CR DG CHEST 2V
1 series · 2 of 2 positions shown · non-contrast
Comparison: CT chest [DATE]. Single-view of the chest
[DATE].

CLINICAL DATA: Chest pain radiating into the left arm since
[DATE].

EXAM:
CHEST  2 VIEW

[Series 1: dg chest 2 view · 0.14mm/px · 2 of 2 slices shown]
[im 1/2]
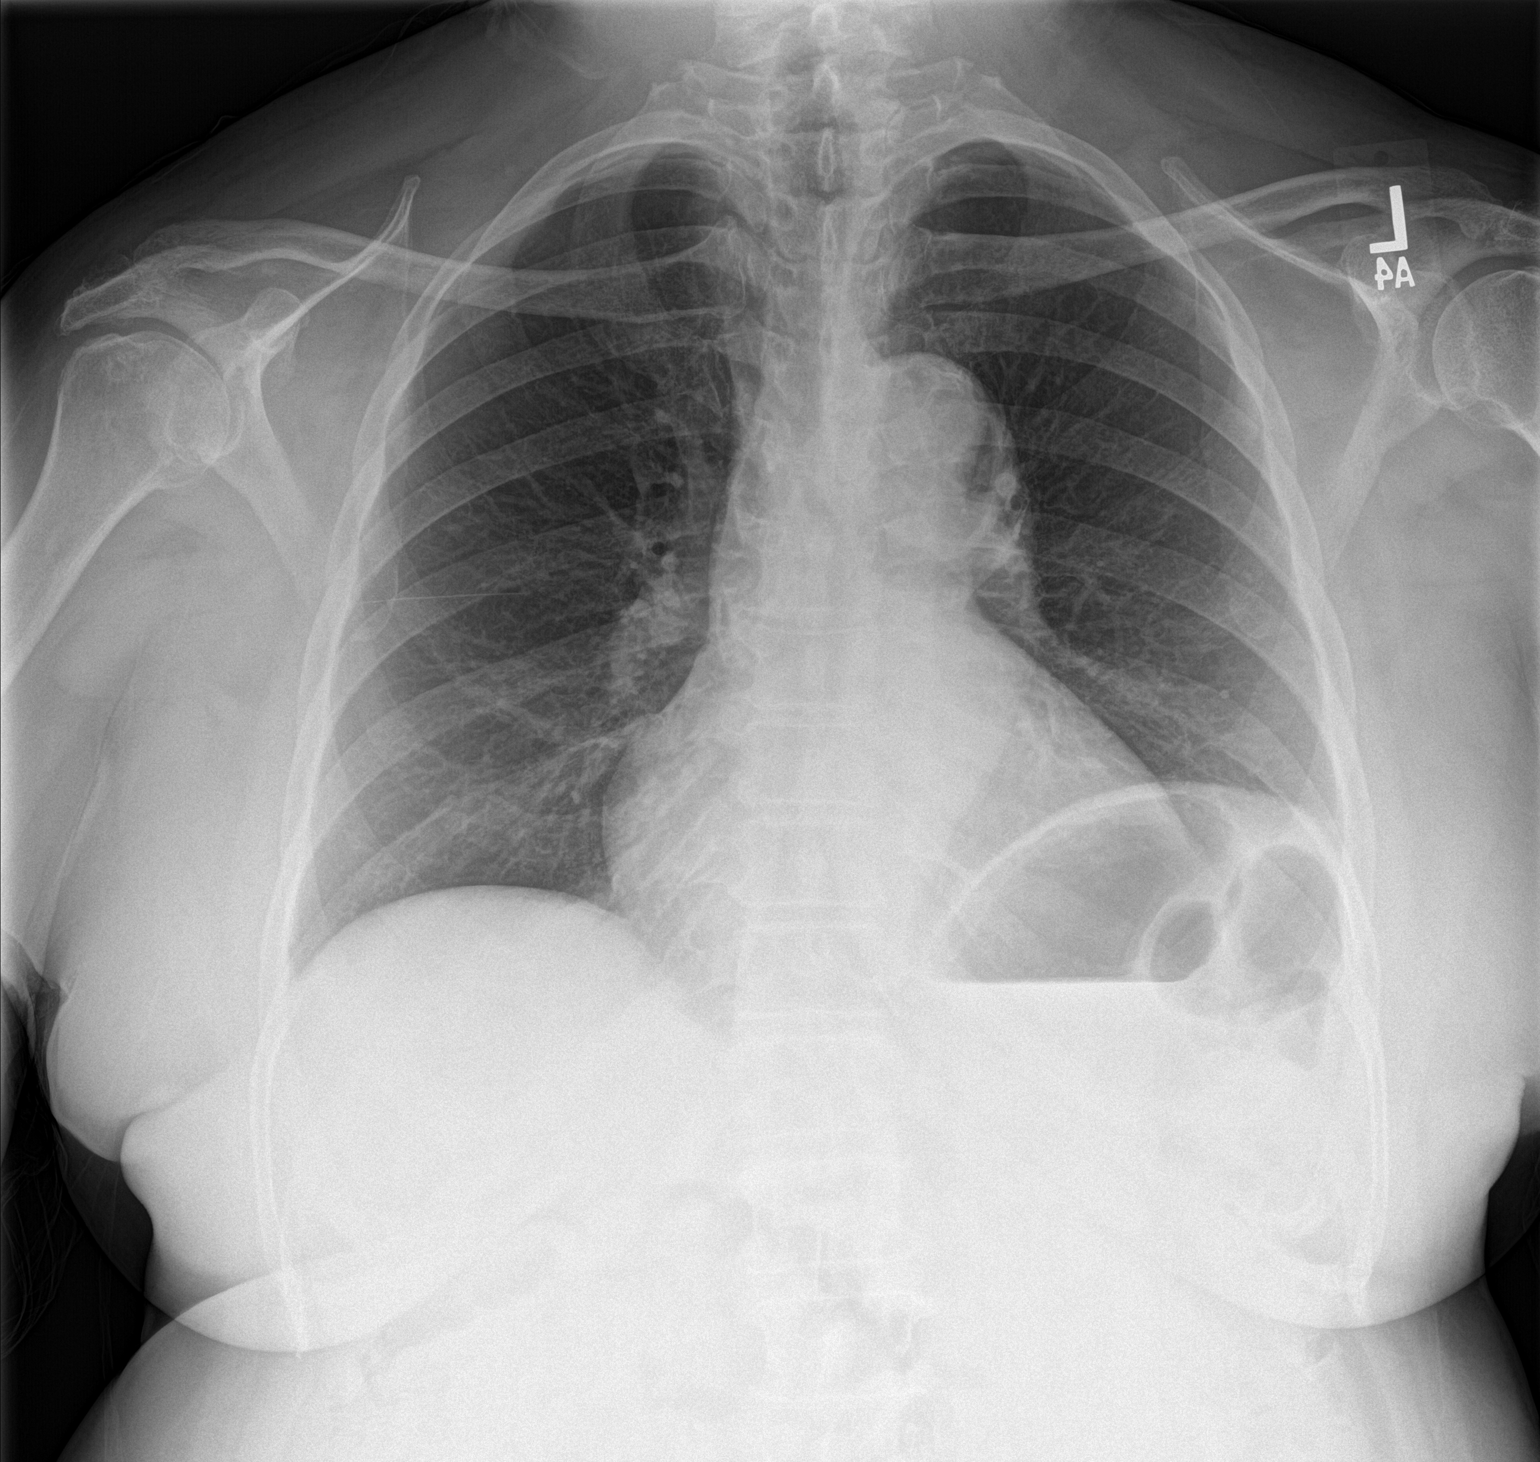
[im 2/2]
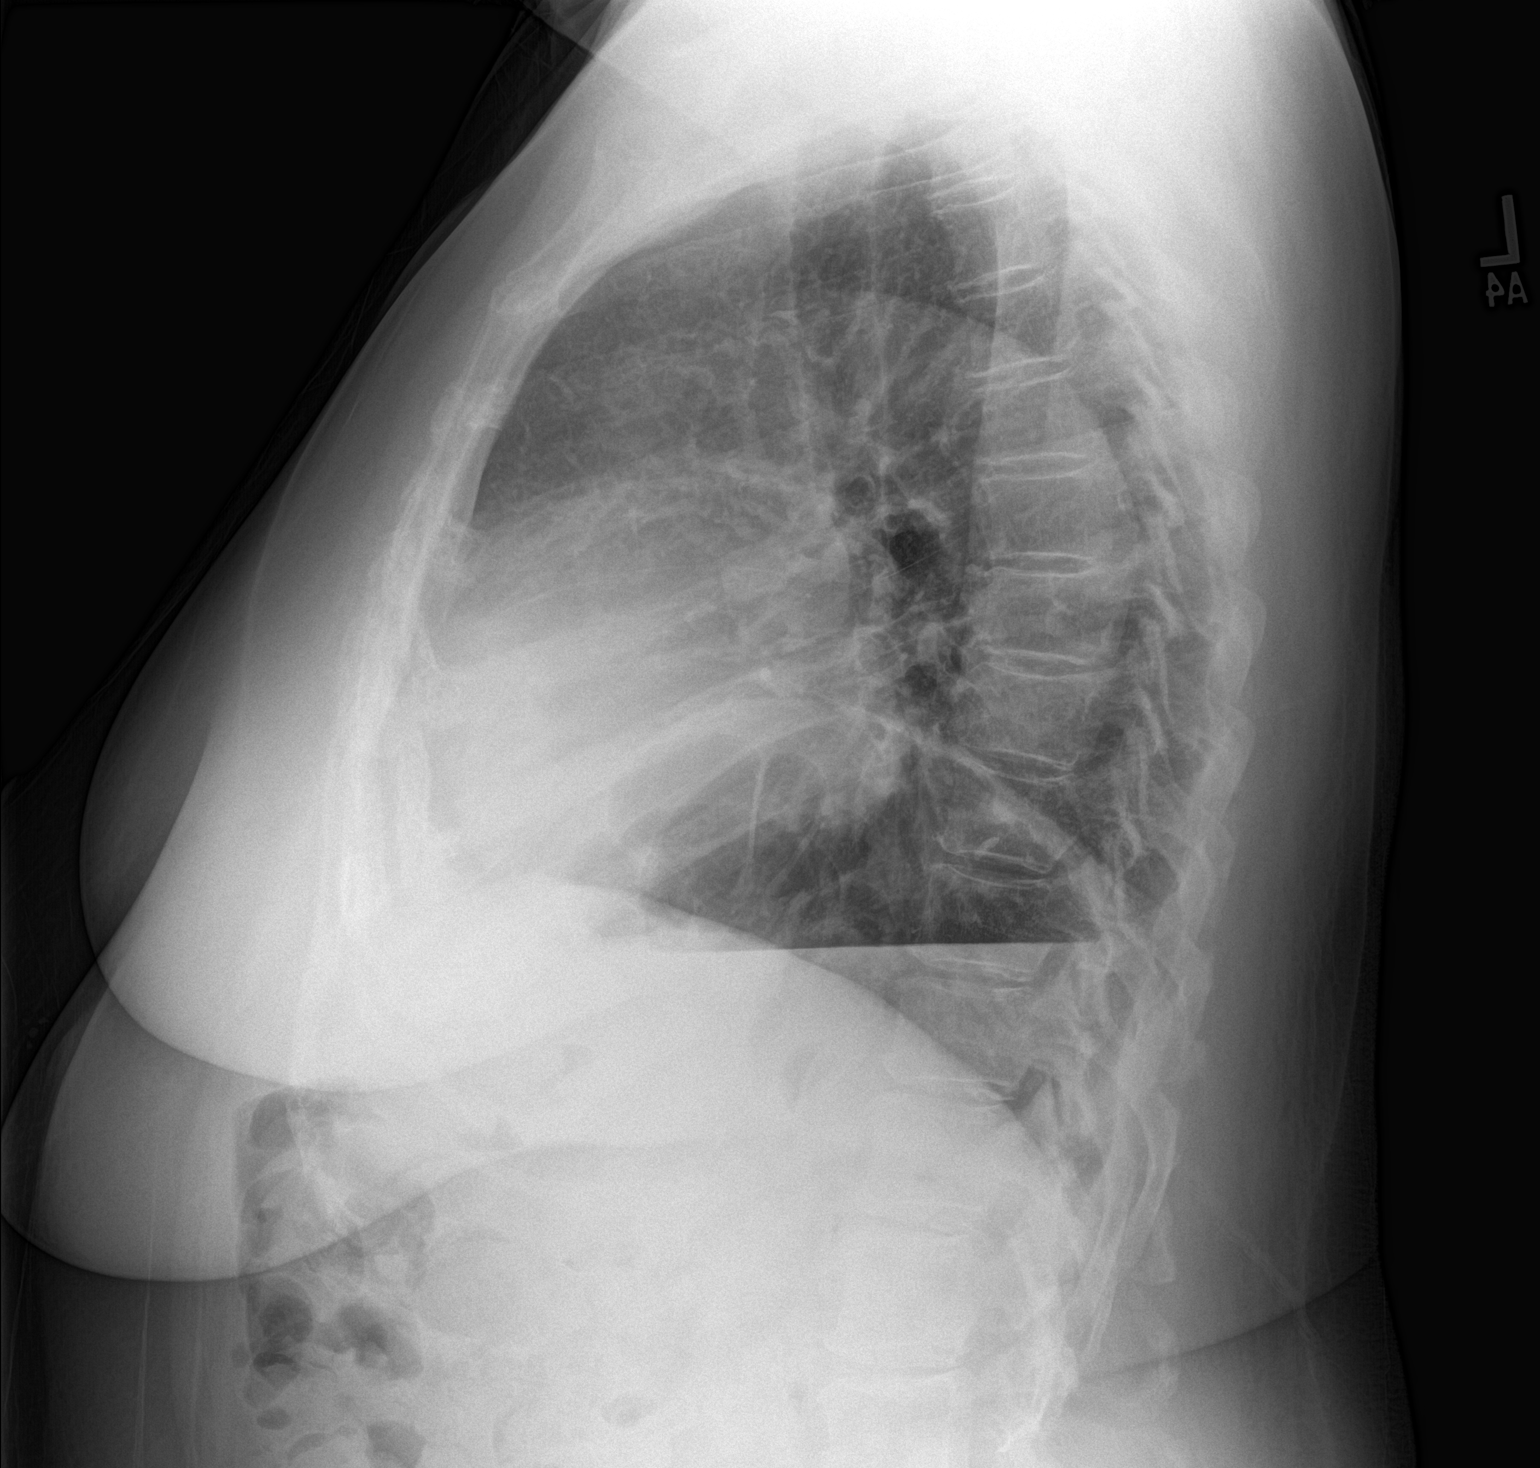

[2 of 2 positions shown; findings below may reference images not displayed]

FINDINGS: Lungs are clear. Heart size is upper normal. No pneumothorax or
pleural effusion. Aortic atherosclerosis is noted. Asymmetric
elevation of the left hemidiaphragm relative to the right is also
noted. No acute bony abnormality.
IMPRESSION: No acute disease.

Atherosclerosis.

## 2016-10-25 MED ORDER — CLONIDINE HCL 0.1 MG PO TABS
0.2000 mg | ORAL_TABLET | Freq: Two times a day (BID) | ORAL | Status: DC
Start: 2016-10-25 — End: 2016-10-26
  Administered 2016-10-25 – 2016-10-26 (×2): 0.2 mg via ORAL
  Filled 2016-10-25 (×2): qty 2

## 2016-10-25 MED ORDER — CLOPIDOGREL BISULFATE 75 MG PO TABS
300.0000 mg | ORAL_TABLET | Freq: Once | ORAL | Status: AC
  Administered 2016-10-25: 300 mg via ORAL
  Filled 2016-10-25: qty 4

## 2016-10-25 MED ORDER — SODIUM CHLORIDE 0.9 % IV SOLN: Freq: Once | INTRAVENOUS | Status: DC

## 2016-10-25 MED ORDER — ONDANSETRON HCL 4 MG PO TABS: 4.0000 mg | ORAL_TABLET | Freq: Four times a day (QID) | ORAL | Status: DC | PRN

## 2016-10-25 MED ORDER — ACETAMINOPHEN 325 MG PO TABS
650.0000 mg | ORAL_TABLET | Freq: Four times a day (QID) | ORAL | Status: DC | PRN
  Administered 2016-10-26: 650 mg via ORAL
  Filled 2016-10-25 (×2): qty 2

## 2016-10-25 MED ORDER — HYDRALAZINE HCL 20 MG/ML IJ SOLN
5.0000 mg | Freq: Once | INTRAMUSCULAR | Status: AC
  Administered 2016-10-25: 5 mg via INTRAVENOUS
  Filled 2016-10-25: qty 1

## 2016-10-25 MED ORDER — CITALOPRAM HYDROBROMIDE 20 MG PO TABS
20.0000 mg | ORAL_TABLET | Freq: Every day | ORAL | Status: DC
  Administered 2016-10-26 – 2016-10-27 (×2): 20 mg via ORAL
  Filled 2016-10-25 (×2): qty 1

## 2016-10-25 MED ORDER — LEVOTHYROXINE SODIUM 75 MCG PO TABS
75.0000 ug | ORAL_TABLET | Freq: Every day | ORAL | Status: DC
  Administered 2016-10-27: 75 ug via ORAL
  Filled 2016-10-25: qty 1

## 2016-10-25 MED ORDER — ASPIRIN 81 MG PO CHEW
324.0000 mg | CHEWABLE_TABLET | Freq: Once | ORAL | Status: AC
  Administered 2016-10-25: 324 mg via ORAL
  Filled 2016-10-25: qty 4

## 2016-10-25 MED ORDER — HEPARIN SODIUM (PORCINE) 5000 UNIT/ML IJ SOLN: 4000.0000 [IU] | Freq: Once | INTRAMUSCULAR | Status: DC

## 2016-10-25 MED ORDER — LABETALOL HCL 200 MG PO TABS
100.0000 mg | ORAL_TABLET | Freq: Two times a day (BID) | ORAL | Status: DC
  Administered 2016-10-25 – 2016-10-27 (×4): 100 mg via ORAL
  Filled 2016-10-25 (×4): qty 1

## 2016-10-25 MED ORDER — HEPARIN (PORCINE) IN NACL 100-0.45 UNIT/ML-% IJ SOLN
1000.0000 [IU]/h | INTRAMUSCULAR | Status: DC
  Administered 2016-10-25: 1000 [IU]/h via INTRAVENOUS
  Filled 2016-10-25: qty 250

## 2016-10-25 MED ORDER — SODIUM CHLORIDE 0.9 % IV SOLN
INTRAVENOUS | Status: DC
  Administered 2016-10-25: 23:00:00 via INTRAVENOUS

## 2016-10-25 MED ORDER — ACETAMINOPHEN 650 MG RE SUPP: 650.0000 mg | Freq: Four times a day (QID) | RECTAL | Status: DC | PRN

## 2016-10-25 MED ORDER — IRBESARTAN 75 MG PO TABS
300.0000 mg | ORAL_TABLET | Freq: Every day | ORAL | Status: DC
  Administered 2016-10-26 – 2016-10-27 (×2): 300 mg via ORAL
  Filled 2016-10-25 (×2): qty 4

## 2016-10-25 MED ORDER — HEPARIN BOLUS VIA INFUSION
4000.0000 [IU] | Freq: Once | INTRAVENOUS | Status: AC
  Administered 2016-10-25: 4000 [IU] via INTRAVENOUS
  Filled 2016-10-25: qty 4000

## 2016-10-25 MED ORDER — ONDANSETRON HCL 4 MG/2ML IJ SOLN: 4.0000 mg | Freq: Four times a day (QID) | INTRAMUSCULAR | Status: DC | PRN

## 2016-10-25 MED ORDER — PANTOPRAZOLE SODIUM 40 MG PO TBEC
40.0000 mg | DELAYED_RELEASE_TABLET | Freq: Two times a day (BID) | ORAL | Status: DC
  Administered 2016-10-25 – 2016-10-27 (×4): 40 mg via ORAL
  Filled 2016-10-25 (×4): qty 1

## 2016-10-25 MED ORDER — ASPIRIN EC 81 MG PO TBEC
81.0000 mg | DELAYED_RELEASE_TABLET | Freq: Every day | ORAL | Status: DC
  Administered 2016-10-26 – 2016-10-27 (×2): 81 mg via ORAL
  Filled 2016-10-25 (×2): qty 1

## 2016-10-25 MED ORDER — GI COCKTAIL ~~LOC~~
30.0000 mL | Freq: Once | ORAL | Status: AC
  Administered 2016-10-25: 30 mL via ORAL
  Filled 2016-10-25: qty 30

## 2016-10-25 MED ORDER — VITAMIN D 1000 UNITS PO TABS
1000.0000 [IU] | ORAL_TABLET | Freq: Every day | ORAL | Status: DC
  Administered 2016-10-26 – 2016-10-27 (×2): 1000 [IU] via ORAL
  Filled 2016-10-25 (×2): qty 1

## 2016-10-25 MED ORDER — NITROGLYCERIN 2 % TD OINT: 0.5000 [in_us] | TOPICAL_OINTMENT | Freq: Once | TRANSDERMAL | Status: DC

## 2016-10-25 NOTE — ED Triage Notes (Signed)
Pt stated that Saturday she started with chest pain that radiated down left arm - the pain in arm is burning and tingling - the chest pain is a heaviness - pt reports nausea/weakness - denies shortness of breath

## 2016-10-25 NOTE — H&P (Signed)
[place] [facility] [place] NAME: Betty Nielsen    MR#:  [id number]  DATE OF BIRTH:  November 02, 1945  DATE OF ADMISSION:  10/25/2016  PRIMARY CARE PHYSICIAN: Loistine Chance, MD   REQUESTING/REFERRING PHYSICIAN: Dr Conni Slipper  CHIEF COMPLAINT:   Chief Complaint  Patient presents with  . Chest Pain    HISTORY OF PRESENT ILLNESS:  Betty Nielsen  is a 71 y.o. female with a known history of Peripheral vascular disease, hyperlipidemia, hypertension and hypothyroidism chronic kidney disease. She presents to the hospital with burning under her left arm starting Saturday night which turned into upper chest pain. She had nausea and sweating all over at that time. The chest pain settled down at that time she was sitting watching TV. Last night it happened again where she had some burning under her arm and then developed upper chest pain sweating and some nausea and she is belching a lot. Currently she feels fine. She went over the walk-in clinic and was referred over to the ER. In the ER her troponin was found to be borderline. ER physician started on heparin drip.  PAST MEDICAL HISTORY:   Past Medical History:  Diagnosis Date  . Arthrosis of knee   . Chronic kidney disease   . Hyperlipidemia   . Hypertension   . Loss of hearing   . Metabolic syndrome   . Plantar fasciitis     PAST SURGICAL HISTORY:   Past Surgical History:  Procedure Laterality Date  . ABDOMINAL AORTIC ANEURYSM REPAIR    . ABDOMINAL HYSTERECTOMY    . APPENDECTOMY    . HERNIA REPAIR    . INNER EAR SURGERY     pt not sure of type  . THYROIDECTOMY      SOCIAL HISTORY:   Social History  Substance Use Topics  . Smoking status: Former Research scientist (life sciences)  . Smokeless tobacco: Never Used  . Alcohol use No    FAMILY HISTORY:   Family History  Problem Relation Age of Onset  . Cerebral aneurysm Father   . Hypertension Sister     DRUG ALLERGIES:   Allergies  Allergen  Reactions  . Ace Inhibitors Cough  . Ciprofloxacin Other (See Comments)  . Hydrocodone-Acetaminophen Other (See Comments)  . Norvasc [Amlodipine Besylate]     Pruritus   . Pantoprazole Sodium Other (See Comments)    Cramps  . Pravastatin Other (See Comments)    Stomach cramps  . Sulfa Antibiotics     REVIEW OF SYSTEMS:  CONSTITUTIONAL: No fever. Positive for sweating. Positive for weight gain. Positive for fatigue.  EYES: No blurred or double vision. Wears glasses. EARS, NOSE, AND THROAT: No tinnitus or ear pain. No sore throat. Positive for runny nose. Decreased hearing left ear with hearing aid RESPIRATORY: No cough, positive for shortness of breath, no wheezing or hemoptysis.  CARDIOVASCULAR: Positive for chest pain, no orthopnea, edema.  GASTROINTESTINAL: Positive for nausea and belching. No vomiting, diarrhea or abdominal pain. No blood in bowel movements. Positive for constipation GENITOURINARY: No dysuria, hematuria.  ENDOCRINE: No polyuria, nocturia,  HEMATOLOGY: No anemia, easy bruising or bleeding SKIN: No rash or lesion. MUSCULOSKELETAL: No joint pain or arthritis.   NEUROLOGIC: No tingling, numbness, weakness.  PSYCHIATRY: No anxiety or depression.   MEDICATIONS AT HOME:   Prior to Admission medications   Medication Sig Start Date End Date Taking? Authorizing Provider  acetaminophen (TYLENOL ARTHRITIS PAIN) 650 MG CR tablet Take 650 mg by mouth every  8 (eight) hours as needed for pain.   Yes Historical Provider, MD  aspirin 81 MG tablet Take 81 mg by mouth daily.   Yes Historical Provider, MD  cholecalciferol (VITAMIN D) 1000 units tablet Take 1,000 Units by mouth daily.   Yes Historical Provider, MD  citalopram (CELEXA) 20 MG tablet Take 1 tablet (20 mg total) by mouth daily. 05/26/16  Yes Steele Sizer, MD  cloNIDine (CATAPRES) 0.1 MG tablet Take 1 tablet (0.1 mg total) by mouth 2 (two) times daily. Patient taking differently: Take 0.2 mg by mouth 2 (two) times  daily.  06/15/16  Yes Steele Sizer, MD  irbesartan (AVAPRO) 300 MG tablet Take 1 tablet (300 mg total) by mouth daily. 06/15/16  Yes Steele Sizer, MD  labetalol (NORMODYNE) 100 MG tablet Take 1 tablet (100 mg total) by mouth 2 (two) times daily. 06/15/16  Yes Steele Sizer, MD  levothyroxine (SYNTHROID, LEVOTHROID) 75 MCG tablet Take 1 tablet (75 mcg total) by mouth daily before breakfast. 1 tablet daily and 1/2 tablet on Sundays until f/u Patient taking differently: Take 75 mcg by mouth daily before breakfast.  05/26/16  Yes Steele Sizer, MD  meclizine (ANTIVERT) 12.5 MG tablet Take 1 tablet (12.5 mg total) by mouth 3 (three) times daily as needed for dizziness. 06/15/16  Yes Steele Sizer, MD  Omega-3 Fatty Acids (FISH OIL PO) Take 1 capsule by mouth daily.   Yes Historical Provider, MD  pantoprazole (PROTONIX) 40 MG tablet Take 1 tablet (40 mg total) by mouth daily. 05/26/16  Yes Steele Sizer, MD      VITAL SIGNS:  Blood pressure 126/71, pulse 75, temperature 97.4 F (36.3 C), temperature source Oral, resp. rate 16, height 5\' 6"  (1.676 m), weight 94.8 kg (209 lb), SpO2 98 %.  PHYSICAL EXAMINATION:  GENERAL:  71 y.o.-year-old patient lying in the bed with no acute distress.  EYES: Pupils equal, round, reactive to light and accommodation. No scleral icterus. Extraocular muscles intact.  HEENT: Head atraumatic, normocephalic. Oropharynx and nasopharynx clear.  NECK:  Supple, no jugular venous distention. No thyroid enlargement, no tenderness.  LUNGS: Normal breath sounds bilaterally, no wheezing, rales,rhonchi or crepitation. No use of accessory muscles of respiration.  CARDIOVASCULAR: S1, S2 normal. No murmurs, rubs, or gallops.  ABDOMEN: Soft, nontender, nondistended. Bowel sounds present. No organomegaly or mass.  EXTREMITIES: No pedal edema, cyanosis, or clubbing.  NEUROLOGIC: Cranial nerves II through XII are intact. Muscle strength 5/5 in all extremities. Sensation intact. Gait not  checked.  PSYCHIATRIC: The patient is alert and oriented x 3.  SKIN: No rash, lesion, or ulcer.   LABORATORY PANEL:   CBC  Recent Labs Lab 10/25/16 1445  WBC 5.5  HGB 11.6*  HCT 33.3*  PLT 310   ------------------------------------------------------------------------------------------------------------------  Chemistries   Recent Labs Lab 10/25/16 1445  NA 129*  K 4.0  CL 99*  CO2 22  GLUCOSE 127*  BUN 16  CREATININE 1.53*  CALCIUM 9.2   ------------------------------------------------------------------------------------------------------------------  Cardiac Enzymes  Recent Labs Lab 10/25/16 1445  TROPONINI 0.16*   ------------------------------------------------------------------------------------------------------------------  RADIOLOGY:  Dg Chest 2 View  Result Date: 10/25/2016 CLINICAL DATA:  Chest pain radiating into the left arm since 10/23/2016. EXAM: CHEST  2 VIEW COMPARISON:  CT chest 04/04/2012. Single-view of the chest 03/05/2010. FINDINGS: Lungs are clear. Heart size is upper normal. No pneumothorax or pleural effusion. Aortic atherosclerosis is noted. Asymmetric elevation of the left hemidiaphragm relative to the right is also noted. No acute bony abnormality. IMPRESSION: No acute  disease. Atherosclerosis. Electronically Signed   By: Inge Rise M.D.   On: 10/25/2016 15:06    EKG:   Normal sinus rhythm 79 bpm, left axis deviation, left ventricular hypertrophy  IMPRESSION AND PLAN:   1. Chest pain with borderline troponin. ER physician gave a dose of Plavix and started on heparin drip and aspirin. I will check 2 more troponins and monitor on telemetry. Case discussed with Dr. Ubaldo Glassing cardiology to see in the morning and decide on further testing based on how she is feeling and whether or not the next 2 troponins go down or go up. Continue aspirin and labetalol. 2. Chronic kidney disease on irbesartan for kidney protection. Gentle IV fluids  overnight 3. Essential hypertension continue usual medications 4. Chronic hyponatremia. Send a urine osmolarity and spot urine sodium gentle IV fluids overnight 5. GERD on Protonix increased to twice a day and give a GI cocktail 6. Hypothyroidism unspecified on levothyroxine check a TSH   All the records are reviewed and case discussed with ED provider. Management plans discussed with the patient, family and they are in agreement.  CODE STATUS: Full code  TOTAL TIME TAKING CARE OF THIS PATIENT: 50 minutes.    Loletha Grayer M.D on 10/25/2016 at 6:20 PM  Between 7am to 6pm - Pager - [phone]  After 6pm call admission pager [phone]  [facility]  [phone]  CC: Primary care physician; Loistine Chance, MD

## 2016-10-25 NOTE — ED Provider Notes (Signed)
[facility] Emergency Department Provider Note   ____________________________________________   First MD Initiated Contact with Patient 10/25/16 1630     (approximate)  I have reviewed the triage vital signs and the nursing notes.   HISTORY  Chief Complaint Chest Pain    HPI Betty Nielsen is a 71 y.o. female [facility]. She reports Saturday she started having some chest pain radiating down the left arm. The pain is burning and tingling. The changes pain as a heaviness. She gets short of breath when she walks but not otherwise. She said she just wanted some pain medicine for the pain. She doesn't want to stay in the hospital. I had to explain to her in some detail that her EKG is changed her troponin is positive emesis showing signs of heart strain or injury and possibly a heart attack.  Past Medical History:  Diagnosis Date  . Arthrosis of knee   . Chronic kidney disease   . Hyperlipidemia   . Hypertension   . Loss of hearing   . Metabolic syndrome   . Plantar fasciitis     Patient Active Problem List   Diagnosis Date Noted  . BPV (benign positional vertigo) 06/15/2016  . 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[place]) 11/21/2015  . Anemia of chronic disease 06/05/2015  . Acid reflux 06/05/2015  . HLD (hyperlipidemia) 06/05/2015  . Hypertension, benign 06/05/2015  . Hypothyroidism, 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  . Chronic kidney disease 06/05/2015  . Major depression in remission ([place]) 06/05/2015  . Obesity (BMI 30.0-34.9) 06/05/2015  . Neuropathy ([place]) 06/05/2015  . Perennial allergic rhinitis 06/05/2015  . Arthritis, degenerative 07/03/2014  . AAA (abdominal aortic aneurysm) without rupture ([place]) 06/26/2014    Past Surgical History:  Procedure Laterality Date  . ABDOMINAL AORTIC ANEURYSM REPAIR    . ABDOMINAL HYSTERECTOMY    . APPENDECTOMY    . HERNIA REPAIR    . INNER EAR SURGERY     pt not sure of type  . THYROIDECTOMY       Prior to Admission medications   Medication Sig Start Date End Date Taking? Authorizing Provider  acetaminophen (TYLENOL ARTHRITIS PAIN) 650 MG CR tablet Take 650 mg by mouth every 8 (eight) hours as needed for pain.    Historical Provider, MD  aspirin 81 MG tablet Take 81 mg by mouth daily.    Historical Provider, MD  cetirizine (ZYRTEC) 10 MG tablet Take 10 mg by mouth daily.    Historical Provider, MD  citalopram (CELEXA) 20 MG tablet Take 1 tablet (20 mg total) by mouth daily. 05/26/16   Steele Sizer, MD  cloNIDine (CATAPRES) 0.1 MG tablet Take 1 tablet (0.1 mg total) by mouth 2 (two) times daily. 06/15/16   Steele Sizer, MD  docusate sodium (COLACE) 100 MG capsule Take 100 mg by mouth 2 (two) times daily.    Historical Provider, MD  GARLIC PO Take 1 capsule by mouth daily.    Historical Provider, MD  irbesartan (AVAPRO) 300 MG tablet Take 1 tablet (300 mg total) by mouth daily. 06/15/16   Steele Sizer, MD  labetalol (NORMODYNE) 100 MG tablet Take 1 tablet (100 mg total) by mouth 2 (two) times daily. 06/15/16   Steele Sizer, MD  levothyroxine (SYNTHROID, LEVOTHROID) 75 MCG tablet Take 1 tablet (75 mcg total) by mouth daily before breakfast. 1 tablet daily and 1/2 tablet on Sundays until f/u 05/26/16   Steele Sizer, MD  meclizine (ANTIVERT) 12.5 MG tablet Take 1 tablet (12.5 mg  total) by mouth 3 (three) times daily as needed for dizziness. 06/15/16   Steele Sizer, MD  Omega-3 Fatty Acids (FISH OIL PO) Take 1 capsule by mouth daily.    Historical Provider, MD  pantoprazole (PROTONIX) 40 MG tablet Take 1 tablet (40 mg total) by mouth daily. 05/26/16   Steele Sizer, MD    Allergies Ace inhibitors; Ciprofloxacin; Hydrocodone-acetaminophen; Norvasc [amlodipine besylate]; Pantoprazole sodium; Pravastatin; and Sulfa antibiotics  Family History  Problem Relation Age of Onset  . Hypertension Sister     Social History Social History  Substance Use Topics  . Smoking status: Former  Research scientist (life sciences)  . Smokeless tobacco: Never Used  . Alcohol use No    Review of Systems Constitutional: No fever/chills Eyes: No visual changes. ENT: No sore throat. Cardiovasculars the history of present illness. Respiratory: See history of present illness Gastrointestinal: No abdominal pain.  No nausea, no vomiting.  No diarrhea.  No constipation. Genitourinary: Negative for dysuria. Musculoskeletal: Negative for back pain. Skin: Negative for rash. Neurological: Negative for headaches, focal weakness or numbness.  10-point ROS otherwise negative.  ____________________________________________   PHYSICAL EXAM:  VITAL SIGNS: ED Triage Vitals  Enc Vitals Group     BP 10/25/16 1443 126/71     Pulse Rate 10/25/16 1443 75     Resp 10/25/16 1443 16     Temp 10/25/16 1443 97.4 F (36.3 C)     Temp Source 10/25/16 1443 Oral     SpO2 10/25/16 1443 98 %     Weight 10/25/16 1442 209 lb (94.8 kg)     Height 10/25/16 1442 5\' 6"  (1.676 m)     Head Circumference --      Peak Flow --      Pain Score 10/25/16 1442 2     Pain Loc --      Pain Edu? --      Excl. in [place]? --    Constitutional: Alert and oriented. Well appearing and in no acute distress. Eyes: Conjunctivae are normal. PERRL. EOMI. Head: Atraumatic. Nose: No congestion/rhinnorhea. Mouth/Throat: Mucous membranes are moist.  Oropharynx non-erythematous. Neck: No stridor. Cardiovascular: Normal rate, regular rhythm. Grossly normal heart sounds.  Good peripheral circulation. Respiratory: Normal respiratory effort.  No retractions. Lungs CTAB. Gastrointestinal: Soft and nontender. No distention. No abdominal bruits. No CVA tenderness. urologic:  Normal speech and language. No gross focal neurologic deficits are appreciated. No gait instability. Skin:  Skin is warm, dry and intact. No rash noted.   ____________________________________________   LABS (all labs ordered are listed, but only abnormal results are displayed)  Labs  Reviewed  BASIC METABOLIC PANEL - Abnormal; Notable for the following:       Result Value   Sodium 129 (*)    Chloride 99 (*)    Glucose, Bld 127 (*)    Creatinine, Ser 1.53 (*)    GFR calc non Af Amer 33 (*)    GFR calc Af Amer 39 (*)    All other components within normal limits  CBC - Abnormal; Notable for the following:    RBC 3.71 (*)    Hemoglobin 11.6 (*)    HCT 33.3 (*)    All other components within normal limits  TROPONIN I - Abnormal; Notable for the following:    Troponin I 0.16 (*)    All other components within normal limits   ____________________________________________  EKG  EKG read and interpreted by me shows normal sinus rhythm rate of 79 left axis there is  diffuse T-wave flattening and T-wave inversion in V4 V5 and V6 and biphasic T waves in 1 and affect. These changes are new from February 2016 ____________________________________________  RADIOLOGY  Study Result   CLINICAL DATA:  Chest pain radiating into the left arm since 10/23/2016.  EXAM: CHEST  2 VIEW  COMPARISON:  CT chest 04/04/2012. Single-view of the chest 03/05/2010.  FINDINGS: Lungs are clear. Heart size is upper normal. No pneumothorax or pleural effusion. Aortic atherosclerosis is noted. Asymmetric elevation of the left hemidiaphragm relative to the right is also noted. No acute bony abnormality.  IMPRESSION: No acute disease.  Atherosclerosis.   Electronically Signed   By: Inge Rise M.D.   On: 10/25/2016 15:06    ____________________________________________   PROCEDURES  Procedure(s) performed:  Procedures  Critical Care performed:   ____________________________________________   INITIAL IMPRESSION / ASSESSMENT AND PLAN / ED COURSE  Pertinent labs & imaging results that were available during my care of the patient were reviewed by me and considered in my medical decision making (see chart for details).         ____________________________________________   FINAL CLINICAL IMPRESSION(S) / ED DIAGNOSES  Final diagnoses:  NSTEMI (non-ST elevated myocardial infarction) ([place])      NEW MEDICATIONS STARTED DURING THIS VISIT:  New Prescriptions   No medications on file     Note:  This document was prepared using Dragon voice recognition software and may include unintentional dictation errors.    Nena Polio, MD 10/25/16 [phone]

## 2016-10-25 NOTE — ED Notes (Signed)
Spoke with Dr. Leslye Peer x2 regarding BP meds. MD seems to think BP is due to stress from the ER. Wants PO labetalol from pharmacy to be [place]

## 2016-10-25 NOTE — ED Notes (Signed)
Noe Gens., RN notified Dr. Archie Balboa of troponin 0.16

## 2016-10-25 NOTE — Progress Notes (Signed)
ANTICOAGULATION CONSULT NOTE - Initial Consult  Pharmacy Consult for heparin drip Indication: chest pain/ACS  Allergies  Allergen Reactions  . Ace Inhibitors Cough  . Ciprofloxacin Other (See Comments)  . Hydrocodone-Acetaminophen Other (See Comments)  . Norvasc [Amlodipine Besylate]     Pruritus   . Pantoprazole Sodium Other (See Comments)    Cramps  . Pravastatin Other (See Comments)    Stomach cramps  . Sulfa Antibiotics     Patient Measurements: Height: 5\' 6"  (167.6 cm) Weight: 209 lb (94.8 kg) IBW/kg (Calculated) : 59.3 Heparin Dosing Weight: 80kg  Vital Signs: Temp: 97.4 F (36.3 C) (02/05 1443) Temp Source: Oral (02/05 1443) BP: 126/71 (02/05 1443) Pulse Rate: 75 (02/05 1443)  Labs:  Recent Labs  10/25/16 1445  HGB 11.6*  HCT 33.3*  PLT 310  CREATININE 1.53*  TROPONINI 0.16*    Estimated Creatinine Clearance: 39.7 mL/min (by C-G formula based on SCr of 1.53 mg/dL (H)).   Medical History: Past Medical History:  Diagnosis Date  . Arthrosis of knee   . Chronic kidney disease   . Hyperlipidemia   . Hypertension   . Loss of hearing   . Metabolic syndrome   . Plantar fasciitis     Assessment: 71 yo female with STEMI. Pharmacy consulted for heparin dosing and monitoring.   Goal of Therapy:  Heparin level 0.3-0.7 units/ml Monitor platelets by anticoagulation protocol: Yes   Plan:  Baseline labs ordered.  Give 4000 units bolus x 1 Start heparin infusion at 1000 units/hr Check anti-Xa level in 8 hours and daily while on heparin Continue to monitor H&H and platelets  Pernell Dupre, PharmD, BCPS Clinical Pharmacist 10/25/2016 5:54 PM

## 2016-10-26 ENCOUNTER — Encounter
Admission: EM | Disposition: A | Discharge status: Home / Self Care | Payer: Self-pay | Source: Home / Self Care | Attending: Emergency Medicine

## 2016-10-26 HISTORY — PX: CORONARY STENT INTERVENTION: CATH118234

## 2016-10-26 HISTORY — PX: LEFT HEART CATH AND CORONARY ANGIOGRAPHY: CATH118249

## 2016-10-26 LAB — BASIC METABOLIC PANEL
Anion gap: 8 (ref 5–15)
BUN: 18 mg/dL (ref 6–20)
CALCIUM: 9 mg/dL (ref 8.9–10.3)
CO2: 21 mmol/L — AB (ref 22–32)
CREATININE: 1.44 mg/dL — AB (ref 0.44–1.00)
Chloride: 99 mmol/L — ABNORMAL LOW (ref 101–111)
GFR, EST AFRICAN AMERICAN: 42 mL/min — AB (ref >60)
GFR, EST NON AFRICAN AMERICAN: 36 mL/min — AB (ref >60)
Glucose, Bld: 93 mg/dL (ref 65–99)
Potassium: 4 mmol/L (ref 3.5–5.1)
SODIUM: 128 mmol/L — AB (ref 135–145)

## 2016-10-26 LAB — CBC
HCT: 31.7 % — ABNORMAL LOW (ref 35.0–47.0)
Hemoglobin: 10.7 g/dL — ABNORMAL LOW (ref 12.0–16.0)
MCH: 30.4 pg (ref 26.0–34.0)
MCHC: 33.8 g/dL (ref 32.0–36.0)
MCV: 89.7 fL (ref 80.0–100.0)
Platelets: 274 10*3/uL (ref 150–440)
RBC: 3.54 MIL/uL — AB (ref 3.80–5.20)
RDW: 12.8 % (ref 11.5–14.5)
WBC: 6.4 10*3/uL (ref 3.6–11.0)

## 2016-10-26 LAB — LIPID PANEL
CHOLESTEROL: 328 mg/dL — AB (ref 0–200)
HDL: 65 mg/dL (ref >40)
LDL CALC: 252 mg/dL — AB (ref 0–99)
Total CHOL/HDL Ratio: 5 RATIO
Triglycerides: 53 mg/dL (ref <150)
VLDL: 11 mg/dL (ref 0–40)

## 2016-10-26 LAB — HEPARIN LEVEL (UNFRACTIONATED): Heparin Unfractionated: 1.16 IU/mL — ABNORMAL HIGH (ref 0.30–0.70)

## 2016-10-26 LAB — POCT ACTIVATED CLOTTING TIME: ACTIVATED CLOTTING TIME: 340 s

## 2016-10-26 SURGERY — LEFT HEART CATH AND CORONARY ANGIOGRAPHY
Anesthesia: Moderate Sedation

## 2016-10-26 MED ORDER — BIVALIRUDIN 250 MG IV SOLR
INTRAVENOUS | Status: DC | PRN
  Administered 2016-10-26 (×2): 1.75 mg/kg/h via INTRAVENOUS

## 2016-10-26 MED ORDER — NITROGLYCERIN 5 MG/ML IV SOLN
INTRAVENOUS | Status: AC
Start: 2016-10-26 — End: 2016-10-26
  Filled 2016-10-26: qty 10

## 2016-10-26 MED ORDER — SODIUM CHLORIDE 0.9% FLUSH
3.0000 mL | Freq: Two times a day (BID) | INTRAVENOUS | Status: DC
  Administered 2016-10-26: 3 mL via INTRAVENOUS

## 2016-10-26 MED ORDER — ONDANSETRON HCL 4 MG/2ML IJ SOLN: 4.0000 mg | Freq: Four times a day (QID) | INTRAMUSCULAR | Status: DC | PRN

## 2016-10-26 MED ORDER — ASPIRIN 81 MG PO CHEW
CHEWABLE_TABLET | ORAL | Status: AC
  Filled 2016-10-26: qty 4

## 2016-10-26 MED ORDER — ASPIRIN 81 MG PO CHEW
CHEWABLE_TABLET | ORAL | Status: DC | PRN
  Administered 2016-10-26: 243 mg via ORAL

## 2016-10-26 MED ORDER — ASPIRIN 81 MG PO CHEW: 81.0000 mg | CHEWABLE_TABLET | Freq: Every day | ORAL | Status: DC

## 2016-10-26 MED ORDER — SODIUM CHLORIDE 0.9 % IV SOLN
0.2500 mg/kg/h | INTRAVENOUS | Status: AC
  Filled 2016-10-26: qty 250

## 2016-10-26 MED ORDER — HEPARIN (PORCINE) IN NACL 2-0.9 UNIT/ML-% IJ SOLN
INTRAMUSCULAR | Status: AC
  Filled 2016-10-26: qty 500

## 2016-10-26 MED ORDER — SODIUM CHLORIDE 0.9 % WEIGHT BASED INFUSION
1.0000 mL/kg/h | INTRAVENOUS | Status: AC
  Administered 2016-10-26: 1 mL/kg/h via INTRAVENOUS

## 2016-10-26 MED ORDER — SODIUM CHLORIDE 0.9% FLUSH: 3.0000 mL | INTRAVENOUS | Status: DC | PRN

## 2016-10-26 MED ORDER — TICAGRELOR 90 MG PO TABS
ORAL_TABLET | ORAL | Status: AC
  Filled 2016-10-26: qty 2

## 2016-10-26 MED ORDER — SODIUM CHLORIDE 0.9 % WEIGHT BASED INFUSION: 3.0000 mL/kg/h | INTRAVENOUS | Status: DC

## 2016-10-26 MED ORDER — FENTANYL CITRATE (PF) 100 MCG/2ML IJ SOLN
INTRAMUSCULAR | Status: DC | PRN
  Administered 2016-10-26 (×3): 25 ug via INTRAVENOUS

## 2016-10-26 MED ORDER — FENTANYL CITRATE (PF) 100 MCG/2ML IJ SOLN
INTRAMUSCULAR | Status: AC
  Filled 2016-10-26: qty 2

## 2016-10-26 MED ORDER — SODIUM CHLORIDE 0.9 % IV SOLN: 250.0000 mL | INTRAVENOUS | Status: DC | PRN

## 2016-10-26 MED ORDER — HEPARIN (PORCINE) IN NACL 100-0.45 UNIT/ML-% IJ SOLN
750.0000 [IU]/h | INTRAMUSCULAR | Status: DC
  Administered 2016-10-26: 750 [IU]/h via INTRAVENOUS

## 2016-10-26 MED ORDER — BIVALIRUDIN 250 MG IV SOLR
INTRAVENOUS | Status: AC
  Filled 2016-10-26: qty 250

## 2016-10-26 MED ORDER — TICAGRELOR 90 MG PO TABS
90.0000 mg | ORAL_TABLET | Freq: Two times a day (BID) | ORAL | Status: DC
  Administered 2016-10-26 – 2016-10-27 (×2): 90 mg via ORAL
  Filled 2016-10-26 (×2): qty 1

## 2016-10-26 MED ORDER — CLONIDINE HCL 0.1 MG PO TABS
0.1000 mg | ORAL_TABLET | Freq: Two times a day (BID) | ORAL | Status: DC
  Administered 2016-10-26 – 2016-10-27 (×2): 0.1 mg via ORAL
  Filled 2016-10-26 (×2): qty 1

## 2016-10-26 MED ORDER — ACETAMINOPHEN 325 MG PO TABS
650.0000 mg | ORAL_TABLET | ORAL | Status: DC | PRN
  Administered 2016-10-27 (×2): 650 mg via ORAL
  Filled 2016-10-26: qty 2

## 2016-10-26 MED ORDER — ATORVASTATIN CALCIUM 20 MG PO TABS
80.0000 mg | ORAL_TABLET | Freq: Every day | ORAL | Status: DC
  Administered 2016-10-26: 80 mg via ORAL
  Filled 2016-10-26: qty 4

## 2016-10-26 MED ORDER — MIDAZOLAM HCL 2 MG/2ML IJ SOLN
INTRAMUSCULAR | Status: AC
  Filled 2016-10-26: qty 2

## 2016-10-26 MED ORDER — MIDAZOLAM HCL 2 MG/2ML IJ SOLN
INTRAMUSCULAR | Status: DC | PRN
  Administered 2016-10-26: 1 mg via INTRAVENOUS

## 2016-10-26 MED ORDER — LABETALOL HCL 5 MG/ML IV SOLN: 10.0000 mg | INTRAVENOUS | Status: AC | PRN

## 2016-10-26 MED ORDER — SODIUM CHLORIDE 0.9% FLUSH
3.0000 mL | Freq: Two times a day (BID) | INTRAVENOUS | Status: DC
  Administered 2016-10-26 – 2016-10-27 (×2): 3 mL via INTRAVENOUS

## 2016-10-26 MED ORDER — IOPAMIDOL (ISOVUE-300) INJECTION 61%
INTRAVENOUS | Status: DC | PRN
Start: 2016-10-26 — End: 2016-10-26
  Administered 2016-10-26: 270 mL via INTRA_ARTERIAL

## 2016-10-26 MED ORDER — ASPIRIN 81 MG PO CHEW: 81.0000 mg | CHEWABLE_TABLET | ORAL | Status: DC

## 2016-10-26 MED ORDER — IOPAMIDOL (ISOVUE-300) INJECTION 61%
INTRAVENOUS | Status: DC | PRN
  Administered 2016-10-26: 100 mL via INTRA_ARTERIAL

## 2016-10-26 MED ORDER — HYDRALAZINE HCL 20 MG/ML IJ SOLN
5.0000 mg | INTRAMUSCULAR | Status: AC | PRN
  Administered 2016-10-26: 5 mg via INTRAVENOUS

## 2016-10-26 MED ORDER — SODIUM CHLORIDE 0.9 % WEIGHT BASED INFUSION: 1.0000 mL/kg/h | INTRAVENOUS | Status: DC

## 2016-10-26 MED ORDER — TICAGRELOR 90 MG PO TABS
ORAL_TABLET | ORAL | Status: DC | PRN
  Administered 2016-10-26: 180 mg via ORAL

## 2016-10-26 MED ORDER — BIVALIRUDIN BOLUS VIA INFUSION - CUPID
INTRAVENOUS | Status: DC | PRN
  Administered 2016-10-26: 68.025 mg via INTRAVENOUS

## 2016-10-26 MED ORDER — NITROGLYCERIN 2 % TD OINT
1.0000 [in_us] | TOPICAL_OINTMENT | Freq: Four times a day (QID) | TRANSDERMAL | Status: DC
  Administered 2016-10-26 – 2016-10-27 (×4): 1 [in_us] via TOPICAL
  Filled 2016-10-26 (×3): qty 1

## 2016-10-26 MED ORDER — HYDRALAZINE HCL 20 MG/ML IJ SOLN
INTRAMUSCULAR | Status: AC
  Filled 2016-10-26: qty 1

## 2016-10-26 MED ORDER — NITROGLYCERIN 1 MG/10 ML FOR IR/CATH LAB
INTRA_ARTERIAL | Status: DC | PRN
  Administered 2016-10-26 (×2): 200 ug via INTRACORONARY

## 2016-10-26 SURGICAL SUPPLY — 23 items
BALLN TREK RX 2.5X15 (BALLOONS) ×3
BALLN ~~LOC~~ TREK RX 3.5X15 (BALLOONS) ×3
BALLOON TREK RX 2.5X15 (BALLOONS) IMPLANT
BALLOON ~~LOC~~ TREK RX 3.5X15 (BALLOONS) IMPLANT
CATH 5FR JL4 DIAGNOSTIC (CATHETERS) ×2 IMPLANT
CATH 5FR JR4 DIAGNOSTIC (CATHETERS) ×1 IMPLANT
CATH INFINITI 5FR ANG PIGTAIL (CATHETERS) ×2 IMPLANT
CATH VISTA GUIDE 6FR AR1 (CATHETERS) ×2 IMPLANT
CATH VISTA GUIDE 6FR AR2 (CATHETERS) ×2 IMPLANT
CATH VISTA GUIDE 6FR IM 90 CM (CATHETERS) ×2 IMPLANT
CATH VISTA GUIDE 6FR JR3.5 SH (CATHETERS) ×2 IMPLANT
CATH VISTA GUIDE 6FR JR4 SH (CATHETERS) ×2 IMPLANT
DEVICE CLOSURE MYNXGRIP 6/7F (Vascular Products) ×2 IMPLANT
DEVICE INFLAT 30 PLUS (MISCELLANEOUS) ×2 IMPLANT
KIT MANI 3VAL PERCEP (MISCELLANEOUS) ×3 IMPLANT
NDL PERC 18GX7CM (NEEDLE) IMPLANT
NEEDLE PERC 18GX7CM (NEEDLE) ×3 IMPLANT
PACK CARDIAC CATH (CUSTOM PROCEDURE TRAY) ×3 IMPLANT
SHEATH AVANTI 5FR X 11CM (SHEATH) ×2 IMPLANT
SHEATH AVANTI 6FR X 11CM (SHEATH) ×2 IMPLANT
STENT XIENCE ALPINE RX 3.25X28 (Permanent Stent) ×2 IMPLANT
WIRE EMERALD 3MM-J .035X150CM (WIRE) ×2 IMPLANT
WIRE G HI TQ BMW 190 (WIRE) ×2 IMPLANT

## 2016-10-26 NOTE — Care Management Obs Status (Signed)
[facility] NOTIFICATION   Patient Details  Name: Betty Nielsen MRN: [id number] Date of Birth: Dec 12, 1945   Medicare Observation Status Notification Given:  Yes  Patient has been off the unit and when return - CM found she had had cardiac cath.  Patient should not sing documents after receiving sedation.  Explained document and told patient would return in the morning to review.  provided with CM contact information    Katrina Stack, RN 10/26/2016, 4:16 PM

## 2016-10-26 NOTE — Progress Notes (Signed)
Patient received from Cath.Lab alert and oriented x4,hemodynamically stable,denies pain,right groin puncture site is clean and dry,no swelling,no inflammation,close monitoring in progress

## 2016-10-26 NOTE — Progress Notes (Addendum)
[facility] [place] NAME: Betty Nielsen    MR#:  [id number]  DATE OF BIRTH:  13-Jul-1946  SUBJECTIVE:  CHIEF COMPLAINT:   Chief Complaint  Patient presents with  . Chest Pain  Patient is 71 year old Caucasian female with medical history significant history of vascular disease, hyperlipidemia, hypertension, hypothyroidism, CAD, who presents to the hospital with complaints of left arm burning, chest pain, nausea and sweating. On arrival to the hospital patient was noted to have mild elevation of troponin to 0.2 maximum. 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 Clayborn Bigness, patient feels comfortable today, no chest pains, no shortness of breath. Blood pressure is highly elevated at 170 at present, patient complains of headache. She was not given clonidine which he uses at home?  Review of Systems  Constitutional: Negative for chills, fever and weight loss.  HENT: Negative for congestion.   Eyes: Negative for blurred vision and double vision.  Respiratory: Negative for cough, sputum production, shortness of breath and wheezing.   Cardiovascular: Negative for chest pain, palpitations, orthopnea, leg swelling and PND.  Gastrointestinal: Negative for abdominal pain, blood in stool, constipation, diarrhea, nausea and vomiting.  Genitourinary: Negative for dysuria, frequency, hematuria and urgency.  Musculoskeletal: Negative for falls.  Neurological: Negative for dizziness, tremors, focal weakness and headaches.  Endo/Heme/Allergies: Does not bruise/bleed easily.  Psychiatric/Behavioral: Negative for depression. The patient does not have insomnia.     VITAL SIGNS: Blood pressure (!) 177/78, pulse (!) 52, temperature 98.1 F (36.7 C), temperature source Oral, resp. rate 18, height 5\' 4"  (1.626 m), weight 90.7 kg (200 lb), SpO2 99  %.  PHYSICAL EXAMINATION:   GENERAL:  71 y.o.-year-old patient lying in the bed with no acute distress.  EYES: Pupils equal, round, reactive to light and accommodation. No scleral icterus. Extraocular muscles intact.  HEENT: Head atraumatic, normocephalic. Oropharynx and nasopharynx clear.  NECK:  Supple, no jugular venous distention. No thyroid enlargement, no tenderness.  LUNGS: Normal breath sounds bilaterally, no wheezing, rales,rhonchi or crepitation. No use of accessory muscles of respiration.  CARDIOVASCULAR: S1, S2 normal. No murmurs, rubs, or gallops.  ABDOMEN: Soft, nontender, nondistended. Bowel sounds present. No organomegaly or mass.  EXTREMITIES: No pedal edema, cyanosis, or clubbing.  NEUROLOGIC: Cranial nerves II through XII are intact. Muscle strength 5/5 in all extremities. Sensation intact. Gait not checked.  PSYCHIATRIC: The patient is alert and oriented x 3.  SKIN: No obvious rash, lesion, or ulcer.   ORDERS/RESULTS REVIEWED:   CBC  Recent Labs Lab 10/25/16 1445 10/26/16 0356  WBC 5.5 6.4  HGB 11.6* 10.7*  HCT 33.3* 31.7*  PLT 310 274  MCV 89.8 89.7  MCH 31.1 30.4  MCHC 34.7 33.8  RDW 13.1 12.8   ------------------------------------------------------------------------------------------------------------------  Chemistries   Recent Labs Lab 10/25/16 1445 10/26/16 0356  NA 129* 128*  K 4.0 4.0  CL 99* 99*  CO2 22 21*  GLUCOSE 127* 93  BUN 16 18  CREATININE 1.53* 1.44*  CALCIUM 9.2 9.0   ------------------------------------------------------------------------------------------------------------------ estimated creatinine clearance is 39.7 mL/min (by C-G formula based on SCr of 1.44 mg/dL (H)). ------------------------------------------------------------------------------------------------------------------  Recent Labs  10/25/16 1829  TSH 0.739    Cardiac Enzymes  Recent Labs Lab 10/25/16 1445 10/25/16 1829 10/25/16 2240  TROPONINI  0.16* 0.20* 0.17*   ------------------------------------------------------------------------------------------------------------------ Invalid input(s): POCBNP ---------------------------------------------------------------------------------------------------------------  RADIOLOGY: Dg Chest 2 View  Result Date: 10/25/2016 CLINICAL DATA:  Chest pain radiating into the left arm since 10/23/2016. EXAM: CHEST  2 VIEW COMPARISON:  CT chest 04/04/2012. Single-view of the chest 03/05/2010. FINDINGS: Lungs are clear. Heart size is upper normal. No pneumothorax or pleural effusion. Aortic atherosclerosis is noted. Asymmetric elevation of the left hemidiaphragm relative to the right is also noted. No acute bony abnormality. IMPRESSION: No acute disease. Atherosclerosis. Electronically Signed   By: Inge Rise M.D.   On: 10/25/2016 15:06    EKG:  Orders placed or performed during the hospital encounter of 10/25/16  . EKG 12-Lead  . EKG 12-Lead  . ED EKG within 10 minutes  . ED EKG within 10 minutes  . EKG 12-Lead immediately post procedure  . EKG 12-Lead  . EKG 12-Lead  . EKG 12-Lead  . EKG 12-Lead immediately post procedure    ASSESSMENT AND PLAN:  Active Problems:   Chest pain  #1. Unstable angina status post cardiac catheterization and drug-eluting stent placement to mid RCA, per report, continue patient on Plavix, aspirin, labetalol, heart rate is at the goal, eventually we'll need to change to Coreg, continue Lipitor #2. Malignant essential hypertension, continue current medications, advised as needed, change labetalol to Coreg and blood pressure is better controlled  #3. Headache, likely due to essential hypertension  #4. Chronic renal insufficiency, CK D stage III, follow after cardiac catheterization  #5. Anemia, stable  #6. Hyperlipidemia, continue Lipitor, follow lipid profile as outpatient, goal of LDL of less than 100 and regular below 70, discussed diet with the patient    Management plans discussed with the patient, family and they are in agreement.   DRUG ALLERGIES:  Allergies  Allergen Reactions  . Ace Inhibitors Cough  . Ciprofloxacin Other (See Comments)  . Hydrocodone-Acetaminophen Other (See Comments)  . Norvasc [Amlodipine Besylate]     Pruritus   . Pantoprazole Sodium Other (See Comments)    Cramps  . Pravastatin Other (See Comments)    Stomach cramps  . Sulfa Antibiotics     CODE STATUS:     Code Status Orders        Start     Ordered   10/25/16 1815  Full code  Continuous     10/25/16 1816    Code Status History    Date Active Date Inactive Code Status Order ID Comments User Context   This patient has a current code status but no historical code status.    Advance Directive Documentation   [place] Most Recent Value  Type of Advance Directive  Living will  Pre-existing out of facility DNR order (yellow form or pink MOST form)  No data  "MOST" Form in Place?  No data      TOTAL TIME TAKING CARE OF THIS PATIENT: 40  minutes.  Discussed with Dr. Madelaine Bhat M.D on 10/26/2016 at 3:06 PM  Between 7am to 6pm - Pager - [phone]  After 6pm go to [email or web address] - password [email or web address] [facility]  Office  [phone]  CC: Primary care physician; Loistine Chance, MD

## 2016-10-26 NOTE — Progress Notes (Signed)
Pt arrived via stretcher for ED. Family at bedside. A&O with no complaints of chest pain at present. Telemetry monitor applied and called to CCMD. Pt oriented to room with socks applied.

## 2016-10-26 NOTE — Consult Note (Addendum)
[place]  CARDIOLOGY CONSULT NOTE  Patient ID: Betty Nielsen MRN: [id number] DOB/AGE: 01/19/1946 71 y.o.  Admit date: 10/25/2016 Referring Physician Dr. Leslye Peer Primary Physician  Dr. Ancil Boozer Primary Cardiologist Dr. Clayborn Bigness Reason for Consultation chest pain  HPI: Patient is a 71 year old female with history of abdominal aortic aneurysm status post repair, history hypertension, hyperlipidemia and mild chronic kidney disease who is admitted with burning pain down her left arm and across her chest. This started several days ago and persisted intermittently at rest and with exertion. She presented to the emergency room where electrocardiogram revealed normal sinus rhythm with nonspecific ST-T wave changes. There were no obvious injury current noted. Her initial serum troponin was 0.16. This increased to 0.2 followed by 0.17. She has no further chest pain. Risk factors include hyperlipidemia, hypertension. Her renal function improved somewhat with hydration to 1.44. This appears to be near her baseline.  Review of Systems  Constitutional: Negative.   HENT: Negative.   Eyes: Negative.   Respiratory: Negative.   Cardiovascular: Positive for chest pain.  Gastrointestinal: Negative.   Genitourinary: Negative.   Musculoskeletal: Negative.   Skin: Negative.   Neurological: Negative.   Endo/Heme/Allergies: Negative.   Psychiatric/Behavioral: Negative.     Past Medical History:  Diagnosis Date  . Arthrosis of knee   . Chronic kidney disease   . Hyperlipidemia   . Hypertension   . Loss of hearing   . Metabolic syndrome   . Plantar fasciitis     Family History  Problem Relation Age of Onset  . Cerebral aneurysm Father   . Hypertension Sister     Social History   Social History  . Marital status: Widowed    Spouse name: N/A  . Number of children: N/A  . Years of education: N/A   Occupational History  . Not on file.   Social History  Main Topics  . Smoking status: Former Research scientist (life sciences)  . Smokeless tobacco: Never Used  . Alcohol use No  . Drug use: No  . Sexual activity: Not Currently   Other Topics Concern  . Not on file   Social History Narrative  . No narrative on file    Past Surgical History:  Procedure Laterality Date  . ABDOMINAL AORTIC ANEURYSM REPAIR    . ABDOMINAL HYSTERECTOMY    . APPENDECTOMY    . HERNIA REPAIR    . INNER EAR SURGERY     pt not sure of type  . THYROIDECTOMY       Prescriptions Prior to Admission  Medication Sig Dispense Refill Last Dose  . acetaminophen (TYLENOL ARTHRITIS PAIN) 650 MG CR tablet Take 650 mg by mouth every 8 (eight) hours as needed for pain.   prn at prn  . aspirin 81 MG tablet Take 81 mg by mouth daily.   10/25/2016 at 1000  . cholecalciferol (VITAMIN D) 1000 units tablet Take 1,000 Units by mouth daily.   10/25/2016 at 1000  . citalopram (CELEXA) 20 MG tablet Take 1 tablet (20 mg total) by mouth daily. 90 tablet 1 10/25/2016 at 1000  . cloNIDine (CATAPRES) 0.1 MG tablet Take 1 tablet (0.1 mg total) by mouth 2 (two) times daily. (Patient taking differently: Take 0.2 mg by mouth 2 (two) times daily. ) 180 tablet 1 10/25/2016 at 1000  . irbesartan (AVAPRO) 300 MG tablet Take 1 tablet (300 mg total) by mouth daily. 90 tablet 1 10/25/2016 at 1000  . labetalol (NORMODYNE) 100 MG tablet  Take 1 tablet (100 mg total) by mouth 2 (two) times daily. 180 tablet 1 10/25/2016 at 1000  . levothyroxine (SYNTHROID, LEVOTHROID) 75 MCG tablet Take 1 tablet (75 mcg total) by mouth daily before breakfast. 1 tablet daily and 1/2 tablet on Sundays until f/u (Patient taking differently: Take 75 mcg by mouth daily before breakfast. ) 100 tablet 1 10/25/2016 at 0645  . meclizine (ANTIVERT) 12.5 MG tablet Take 1 tablet (12.5 mg total) by mouth 3 (three) times daily as needed for dizziness. 15 tablet 0 prn at prn  . Omega-3 Fatty Acids (FISH OIL PO) Take 1 capsule by mouth daily.   10/25/2016 at 1000  . pantoprazole  (PROTONIX) 40 MG tablet Take 1 tablet (40 mg total) by mouth daily. 90 tablet 1 10/25/2016 at 1000    Physical Exam: Blood pressure (!) 160/60, pulse (!) 57, temperature 97.9 F (36.6 C), temperature source Oral, resp. rate 18, height 5\' 4"  (1.626 m), weight 91 kg (200 lb 9.6 oz), SpO2 98 %.   Wt Readings from Last 1 Encounters:  10/25/16 91 kg (200 lb 9.6 oz)     General appearance: alert and cooperative Resp: clear to auscultation bilaterally Chest wall: no tenderness Cardio: regular rate and rhythm GI: soft, non-tender; bowel sounds normal; no masses,  no organomegaly Pulses: 2+ and symmetric Neurologic: Grossly normal  Labs:   Lab Results  Component Value Date   WBC 6.4 10/26/2016   HGB 10.7 (L) 10/26/2016   HCT 31.7 (L) 10/26/2016   MCV 89.7 10/26/2016   PLT 274 10/26/2016    Recent Labs Lab 10/26/16 0356  NA 128*  K 4.0  CL 99*  CO2 21*  BUN 18  CREATININE 1.44*  CALCIUM 9.0  GLUCOSE 93   Lab Results  Component Value Date   CKTOTAL 112 04/05/2012   CKMB 0.5 04/05/2012   TROPONINI 0.17 ([place]) 10/25/2016      Radiology: No acute cardiopulmonary disease EKG: Sinus rhythm with no ischemic changes  ASSESSMENT AND PLAN:  Patient is a 62-year-old female with history of hyperlipidemia hypertension and aortic artery disease who presented with pain with burning down her left arm and across her chest. This is new pain for her. She had a mild serum troponin elevation but appears stable. Discuss consideration for invasive evaluation given her elevated troponin and concerning symptoms versus medical therapy. The patient is discussing this with her family and will make a decision regarding whether or not to proceed with left heart catheter. With left heart catheter without ventriculogram due to renal insufficiency to guide further therapy. Continue heparin, aspirin. Patient was loaded with Plavix in the emergency room at 300 mg times one. We'll get no further Plavix until anatomy  can be better evaluated. Signed: Teodoro Spray MD, [facility] 10/26/2016, 7:56 AM  Addendum:  Pt agrees to cardiac cath. Will proceed and make further recs after cath completed.

## 2016-10-26 NOTE — Progress Notes (Signed)
ANTICOAGULATION CONSULT NOTE - Initial Consult  Pharmacy Consult for heparin drip Indication: chest pain/ACS  Allergies  Allergen Reactions  . Ace Inhibitors Cough  . Ciprofloxacin Other (See Comments)  . Hydrocodone-Acetaminophen Other (See Comments)  . Norvasc [Amlodipine Besylate]     Pruritus   . Pantoprazole Sodium Other (See Comments)    Cramps  . Pravastatin Other (See Comments)    Stomach cramps  . Sulfa Antibiotics     Patient Measurements: Height: 5\' 4"  (162.6 cm) Weight: 200 lb 9.6 oz (91 kg) IBW/kg (Calculated) : 54.7 Heparin Dosing Weight: 80kg  Vital Signs: Temp: 97.9 F (36.6 C) (02/06 0352) Temp Source: Oral (02/06 0352) BP: 160/60 (02/06 0352) Pulse Rate: 57 (02/06 0352)  Labs:  Recent Labs  10/25/16 1445 10/25/16 1829 10/25/16 2240 10/26/16 0356  HGB 11.6*  --   --  10.7*  HCT 33.3*  --   --  31.7*  PLT 310  --   --  274  APTT  --  27  --   --   LABPROT  --  13.2  --   --   INR  --  1.00  --   --   HEPARINUNFRC  --   --   --  1.16*  CREATININE 1.53*  --   --  1.44*  TROPONINI 0.16* 0.20* 0.17*  --     Estimated Creatinine Clearance: 39.7 mL/min (by C-G formula based on SCr of 1.44 mg/dL (H)).   Medical History: Past Medical History:  Diagnosis Date  . Arthrosis of knee   . Chronic kidney disease   . Hyperlipidemia   . Hypertension   . Loss of hearing   . Metabolic syndrome   . Plantar fasciitis     Assessment: 71 yo female with STEMI. Pharmacy consulted for heparin dosing and monitoring.   Goal of Therapy:  Heparin level 0.3-0.7 units/ml Monitor platelets by anticoagulation protocol: Yes   Plan:  Baseline labs ordered.  Give 4000 units bolus x 1 Start heparin infusion at 1000 units/hr Check anti-Xa level in 8 hours and daily while on heparin Continue to monitor H&H and platelets   2/6 AM heparin level 1.16. Hold x 1 hour and restart at 750 units/hr. Recheck 8 hours after restart.  Eloise Harman, PharmD,  BCPS Clinical Pharmacist 10/26/2016 6:41 AM

## 2016-10-26 NOTE — Progress Notes (Signed)
Patient Troponin were positive, heparin infusing. Patient received PRN tylenol for left shoulder pain.VSS, NSR on the monitor.  4917: heparin stopped per pharmacy, timed to be restarted at 0800.

## 2016-10-27 ENCOUNTER — Encounter: Payer: Self-pay | Admitting: Cardiology

## 2016-10-27 DIAGNOSIS — N183 Chronic kidney disease, stage 3 unspecified: Secondary | ICD-10-CM

## 2016-10-27 DIAGNOSIS — E785 Hyperlipidemia, unspecified: Secondary | ICD-10-CM

## 2016-10-27 DIAGNOSIS — R778 Other specified abnormalities of plasma proteins: Secondary | ICD-10-CM

## 2016-10-27 DIAGNOSIS — E871 Hypo-osmolality and hyponatremia: Secondary | ICD-10-CM

## 2016-10-27 DIAGNOSIS — R51 Headache: Secondary | ICD-10-CM

## 2016-10-27 DIAGNOSIS — I10 Essential (primary) hypertension: Secondary | ICD-10-CM

## 2016-10-27 DIAGNOSIS — R519 Headache, unspecified: Secondary | ICD-10-CM

## 2016-10-27 DIAGNOSIS — R7989 Other specified abnormal findings of blood chemistry: Secondary | ICD-10-CM

## 2016-10-27 LAB — BASIC METABOLIC PANEL
ANION GAP: 8 (ref 5–15)
BUN: 15 mg/dL (ref 6–20)
CO2: 20 mmol/L — ABNORMAL LOW (ref 22–32)
Calcium: 8.7 mg/dL — ABNORMAL LOW (ref 8.9–10.3)
Chloride: 101 mmol/L (ref 101–111)
Creatinine, Ser: 1.41 mg/dL — ABNORMAL HIGH (ref 0.44–1.00)
GFR calc Af Amer: 43 mL/min — ABNORMAL LOW (ref >60)
GFR, EST NON AFRICAN AMERICAN: 37 mL/min — AB (ref >60)
Glucose, Bld: 102 mg/dL — ABNORMAL HIGH (ref 65–99)
POTASSIUM: 4.1 mmol/L (ref 3.5–5.1)
SODIUM: 129 mmol/L — AB (ref 135–145)

## 2016-10-27 LAB — CBC
HCT: 30.2 % — ABNORMAL LOW (ref 35.0–47.0)
Hemoglobin: 10.4 g/dL — ABNORMAL LOW (ref 12.0–16.0)
MCH: 30.4 pg (ref 26.0–34.0)
MCHC: 34.4 g/dL (ref 32.0–36.0)
MCV: 88.2 fL (ref 80.0–100.0)
PLATELETS: 263 10*3/uL (ref 150–440)
RBC: 3.42 MIL/uL — AB (ref 3.80–5.20)
RDW: 13.3 % (ref 11.5–14.5)
WBC: 7.1 10*3/uL (ref 3.6–11.0)

## 2016-10-27 MED ORDER — NITROGLYCERIN 0.4 MG SL SUBL: 0.4000 mg | SUBLINGUAL_TABLET | SUBLINGUAL | 12 refills | Status: DC | PRN

## 2016-10-27 MED ORDER — ATORVASTATIN CALCIUM 80 MG PO TABS: 80.0000 mg | ORAL_TABLET | Freq: Every day | ORAL | 6 refills | Status: DC

## 2016-10-27 MED ORDER — CLONIDINE HCL 0.1 MG PO TABS: 0.1000 mg | ORAL_TABLET | Freq: Two times a day (BID) | ORAL | 11 refills | Status: DC

## 2016-10-27 MED ORDER — LEVOTHYROXINE SODIUM 75 MCG PO TABS: 75.0000 ug | ORAL_TABLET | Freq: Every day | ORAL | 6 refills | Status: DC

## 2016-10-27 MED ORDER — TICAGRELOR 90 MG PO TABS: 90.0000 mg | ORAL_TABLET | Freq: Two times a day (BID) | ORAL | 6 refills | Status: DC

## 2016-10-27 MED ORDER — CLONIDINE HCL 0.1 MG PO TABS: 0.2000 mg | ORAL_TABLET | Freq: Two times a day (BID) | ORAL | 6 refills | Status: DC

## 2016-10-27 NOTE — Discharge Summary (Signed)
[name] NAME: Betty Nielsen    MR#:  [id number]  DATE OF BIRTH:  11/17/1945  DATE OF ADMISSION:  10/25/2016 ADMITTING PHYSICIAN: Loletha Grayer, MD  DATE OF DISCHARGE: 10/27/2016  2:21 PM  PRIMARY CARE PHYSICIAN: Loistine Chance, MD     ADMISSION DIAGNOSIS:  Acute coronary syndrome (HCC) [I24.9]  DISCHARGE DIAGNOSIS:  Principal Problem:   Chest pain Active Problems:   Elevated troponin   Essential hypertension, malignant   CKD (chronic kidney disease), stage III   Hyperlipidemia   Obesity   Hyponatremia   Headache   Anemia   SECONDARY DIAGNOSIS:   Past Medical History:  Diagnosis Date  . Arthrosis of knee   . Chronic kidney disease   . Hyperlipidemia   . Hypertension   . Loss of hearing   . Metabolic syndrome   . Plantar fasciitis     .pro HOSPITAL COURSE:   Patient is 71 year old Caucasian female with medical history significant history of Peripheral vascular disease, hyperlipidemia, hypertension, hypothyroidism, hypothyroidism, CAD, who presents to the hospital with complaints of left arm burning, chest pain, nausea and sweating. On arrival to the hospital patient was noted to have mild elevation of troponin to 0.2 maximum. 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 Clayborn Bigness, patient feels comfortable today, no chest pains, no shortness of breath, no headache. Blood pressure was initially found to be elevated, however, improved with medications. The patient was followed by cardiologist who felt that the patient was stable to be discharged home today Discussion by problem: #1. Unstable angina with minimal troponin elevation, status post cardiac catheterization and drug-eluting stent placement to mid RCA,  continue patient on ticagrelor, aspirin, labetalol, Lipitor #2. Malignant essential  hypertension, continue clonidine, Avapro, labetalol, blood pressure is better controlled #3. Headache, due to essential hypertension , resolved #4. Chronic renal insufficiency, CK D stage III, stable after cardiac catheterization  #5. Anemia, stable  #6. Hyperlipidemia, continue Lipitor, follow lipid profile as outpatient, goal of LDL of less than 100 and regular below 70, discussed diet with the patient , she is agreeable. Patient's LDL was found to be 252, cholesterol level of 328, HDL 65, triglycerides 53   DISCHARGE CONDITIONS:   Stable  CONSULTS OBTAINED:  Treatment Team:  Teodoro Spray, MD  DRUG ALLERGIES:   Allergies  Allergen Reactions  . Ace Inhibitors Cough  . Ciprofloxacin Other (See Comments)  . Hydrocodone-Acetaminophen Other (See Comments)  . Norvasc [Amlodipine Besylate]     Pruritus   . Pantoprazole Sodium Other (See Comments)    Cramps  . Pravastatin Other (See Comments)    Stomach cramps  . Sulfa Antibiotics     DISCHARGE MEDICATIONS:   Discharge Medication List as of 10/27/2016  1:55 PM    START taking these medications   Details  atorvastatin (LIPITOR) 80 MG tablet Take 1 tablet (80 mg total) by mouth daily at 6 PM., Starting Wed 10/27/2016, Normal    nitroGLYCERIN (NITROSTAT) 0.4 MG SL tablet Place 1 tablet (0.4 mg total) under the tongue every 5 (five) minutes as needed for chest pain., Starting Wed 10/27/2016, Normal    ticagrelor (BRILINTA) 90 MG TABS tablet Take 1 tablet (90 mg total) by mouth 2 (two) times daily., Starting Wed 10/27/2016, Normal      CONTINUE these medications which have CHANGED   Details  cloNIDine (CATAPRES) 0.1 MG tablet Take 2 tablets (0.2 mg total) by mouth 2 (two) times daily., Starting Wed 10/27/2016, Normal    levothyroxine (SYNTHROID, LEVOTHROID) 75 MCG tablet Take 1 tablet (75 mcg total) by mouth daily before breakfast., Starting Wed 10/27/2016, Normal      CONTINUE these medications which have NOT CHANGED   Details   acetaminophen (TYLENOL ARTHRITIS PAIN) 650 MG CR tablet Take 650 mg by mouth every 8 (eight) hours as needed for pain., Historical Med    aspirin 81 MG tablet Take 81 mg by mouth daily., Historical Med    cholecalciferol (VITAMIN D) 1000 units tablet Take 1,000 Units by mouth daily., Historical Med    citalopram (CELEXA) 20 MG tablet Take 1 tablet (20 mg total) by mouth daily., Starting Wed 05/26/2016, Normal    irbesartan (AVAPRO) 300 MG tablet Take 1 tablet (300 mg total) by mouth daily., Starting Tue 06/15/2016, Normal    labetalol (NORMODYNE) 100 MG tablet Take 1 tablet (100 mg total) by mouth 2 (two) times daily., Starting Tue 06/15/2016, Normal    meclizine (ANTIVERT) 12.5 MG tablet Take 1 tablet (12.5 mg total) by mouth 3 (three) times daily as needed for dizziness., Starting Tue 06/15/2016, Normal    Omega-3 Fatty Acids (FISH OIL PO) Take 1 capsule by mouth daily., Historical Med    pantoprazole (PROTONIX) 40 MG tablet Take 1 tablet (40 mg total) by mouth daily., Starting Wed 05/26/2016, Normal         DISCHARGE INSTRUCTIONS:    The patient is to follow-up with primary care physician, cardiologist as outpatient  If you experience worsening of your admission symptoms, develop shortness of breath, life threatening emergency, suicidal or homicidal thoughts you must seek medical attention immediately by calling 911 or calling your MD immediately  if symptoms less severe.  You Must read complete instructions/literature along with all the possible adverse reactions/side effects for all the Medicines you take and that have been prescribed to you. Take any new Medicines after you have completely understood and accept all the possible adverse reactions/side effects.   Please note  You were cared for by a hospitalist during your hospital stay. If you have any questions about your discharge medications or the care you received while you were in the hospital after you are discharged, you can  call the unit and asked to speak with the hospitalist on call if the hospitalist that took care of you is not available. Once you are discharged, your primary care physician will handle any further medical issues. Please note that NO REFILLS for any discharge medications will be authorized once you are discharged, as it is imperative that you return to your primary care physician (or establish a relationship with a primary care physician if you do not have one) for your aftercare needs so that they can reassess your need for medications and monitor your lab values.    Today   CHIEF COMPLAINT:   Chief Complaint  Patient presents with  . Chest Pain    HISTORY OF PRESENT ILLNESS:  Betty Nielsen  is a 71 y.o. female with a known history of Peripheral vascular disease, hyperlipidemia, hypertension, hypothyroidism, hypothyroidism, CAD, who presents to the hospital with complaints of left arm burning, chest pain, nausea and sweating. On arrival to the hospital patient was noted to have mild elevation of troponin to 0.2 maximum. 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 Clayborn Bigness, patient feels comfortable today, no chest pains, no shortness of breath, no headache. Blood pressure was initially found to be elevated, however, improved with medications. The patient was followed by cardiologist who felt that the patient was stable to be discharged home today Discussion by problem: #1. Unstable angina with minimal troponin elevation, status post cardiac catheterization and drug-eluting stent placement to mid RCA,  continue patient on ticagrelor, aspirin, labetalol, Lipitor #2. Malignant essential hypertension, continue clonidine, Avapro, labetalol, blood pressure is better controlled #3. Headache, due to essential hypertension , resolved #4. Chronic renal insufficiency, CK D stage III, stable  after cardiac catheterization  #5. Anemia, stable  #6. Hyperlipidemia, continue Lipitor, follow lipid profile as outpatient, goal of LDL of less than 100 and regular below 70, discussed diet with the patient , she is agreeable. Patient's LDL was found to be 252, cholesterol level of 328, HDL 65, triglycerides 53    VITAL SIGNS:  Blood pressure (!) 142/72, pulse 72, temperature 98.1 F (36.7 C), temperature source Oral, resp. rate 18, height 5\' 4"  (1.626 m), weight 90.7 kg (200 lb), SpO2 97 %.  I/O:   Intake/Output Summary (Last 24 hours) at 10/27/16 1447 Last data filed at 10/27/16 1014  Gross per 24 hour  Intake              660 ml  Output             1400 ml  Net             -740 ml    PHYSICAL EXAMINATION:  GENERAL:  71 y.o.-year-old patient lying in the bed with no acute distress.  EYES: Pupils equal, round, reactive to light and accommodation. No scleral icterus. Extraocular muscles intact.  HEENT: Head atraumatic, normocephalic. Oropharynx and nasopharynx clear.  NECK:  Supple, no jugular venous distention. No thyroid enlargement, no tenderness.  LUNGS: Normal breath sounds bilaterally, no wheezing, rales,rhonchi or crepitation. No use of accessory muscles of respiration.  CARDIOVASCULAR: S1, S2 normal. No murmurs, rubs, or gallops.  ABDOMEN: Soft, non-tender, non-distended. Bowel sounds present. No organomegaly or mass.  EXTREMITIES: No pedal edema, cyanosis, or clubbing.  NEUROLOGIC: Cranial nerves II through XII are intact. Muscle strength 5/5 in all extremities. Sensation intact. Gait not checked.  PSYCHIATRIC: The patient is alert and oriented x 3.  SKIN: No obvious rash, lesion, or ulcer.   DATA REVIEW:   CBC  Recent Labs Lab 10/27/16 0651  WBC 7.1  HGB 10.4*  HCT 30.2*  PLT 263    Chemistries   Recent Labs Lab 10/27/16 0651  NA 129*  K 4.1  CL 101  CO2 20*  GLUCOSE 102*  BUN 15  CREATININE 1.41*  CALCIUM 8.7*    Cardiac Enzymes  Recent  Labs Lab 10/25/16 2240  TROPONINI 0.17*    Microbiology Results  No results found for this or any previous visit.  RADIOLOGY:  Dg Chest 2 View  Result Date: 10/25/2016 CLINICAL DATA:  Chest pain radiating into the left arm since 10/23/2016. EXAM: CHEST  2 VIEW COMPARISON:  CT chest 04/04/2012. Single-view of the chest 03/05/2010. FINDINGS: Lungs are clear. Heart size is upper normal. No pneumothorax or pleural effusion. Aortic atherosclerosis is noted. Asymmetric elevation of the left hemidiaphragm relative to the right is also noted. No acute bony abnormality. IMPRESSION: No acute disease. Atherosclerosis. Electronically Signed   By: Inge Rise M.D.   On: 10/25/2016 15:06    EKG:  Orders placed or performed during the hospital encounter of 10/25/16  . EKG 12-Lead  . EKG 12-Lead  . ED EKG within 10 minutes  . ED EKG within 10 minutes  . EKG 12-Lead immediately post procedure  . EKG 12-Lead  . EKG 12-Lead  . EKG 12-Lead  . EKG 12-Lead immediately post procedure  . EKG 12-Lead      Management plans discussed with the patient, family and they are in agreement.  CODE STATUS:     Code Status Orders        Start     Ordered   10/25/16 1815  Full code  Continuous     10/25/16 1816    Code Status History    Date Active Date Inactive Code Status Order ID Comments User Context   This patient has a current code status but no historical code status.    Advance Directive Documentation   [facility] Most Recent Value  Type of Advance Directive  Living will  Pre-existing out of facility DNR order (yellow form or pink MOST form)  No data  "MOST" Form in Place?  No data      TOTAL TIME TAKING CARE OF THIS PATIENT: 40 minutes.    Theodoro Grist M.D on 10/27/2016 at 2:47 PM  Between 7am to 6pm - Pager - [phone]  After 6pm go to [email or web address] - password [email or web address] [facility]  Office  [phone]  CC: Primary care physician; Loistine Chance, MD

## 2016-10-27 NOTE — Care Management (Signed)
Provided patient with coupon for Brilinta.

## 2016-10-27 NOTE — Progress Notes (Signed)
[facility]  SUBJECTIVE: No further chest pain. Catheter site is clean and dry with no hematoma or bruit.   Vitals:   10/26/16 1533 10/26/16 2004 10/27/16 0521 10/27/16 0756  BP: (!) 163/68 (!) 143/73 (!) 177/71 (!) 161/71  Pulse: (!) 58 80 63 65  Resp: 17 16 16    Temp: 97.4 F (36.3 C) 97.7 F (36.5 C) 97.8 F (36.6 C) 98.1 F (36.7 C)  TempSrc: Oral Oral Oral Oral  SpO2: 100% 97% 96% 97%  Weight:      Height:        Intake/Output Summary (Last 24 hours) at 10/27/16 0842 Last data filed at 10/27/16 0522  Gross per 24 hour  Intake              660 ml  Output             1600 ml  Net             -940 ml    LABS: Basic Metabolic Panel:  Recent Labs  10/26/16 0356 10/27/16 0651  NA 128* 129*  K 4.0 4.1  CL 99* 101  CO2 21* 20*  GLUCOSE 93 102*  BUN 18 15  CREATININE 1.44* 1.41*  CALCIUM 9.0 8.7*   Liver Function Tests: No results for input(s): AST, ALT, ALKPHOS, BILITOT, PROT, ALBUMIN in the last 72 hours. No results for input(s): LIPASE, AMYLASE in the last 72 hours. CBC:  Recent Labs  10/26/16 0356 10/27/16 0651  WBC 6.4 7.1  HGB 10.7* 10.4*  HCT 31.7* 30.2*  MCV 89.7 88.2  PLT 274 263   Cardiac Enzymes:  Recent Labs  10/25/16 1445 10/25/16 1829 10/25/16 2240  TROPONINI 0.16* 0.20* 0.17*   BNP: Invalid input(s): POCBNP D-Dimer: No results for input(s): DDIMER in the last 72 hours. Hemoglobin A1C: No results for input(s): HGBA1C in the last 72 hours. Fasting Lipid Panel:  Recent Labs  10/26/16 0356  CHOL 328*  HDL 65  LDLCALC 252*  TRIG 53  CHOLHDL 5.0   Thyroid Function Tests:  Recent Labs  10/25/16 1829  TSH 0.739   Anemia Panel: No results for input(s): VITAMINB12, FOLATE, FERRITIN, TIBC, IRON, RETICCTPCT in the last 72 hours.   Physical Exam: Blood pressure (!) 161/71, pulse 65, temperature 98.1 F (36.7 C), temperature source Oral, resp. rate 16, height 5\' 4"  (1.626 m), weight  90.7 kg (200 lb), SpO2 97 %.   Wt Readings from Last 1 Encounters:  10/26/16 90.7 kg (200 lb)     General appearance: alert and cooperative Resp: clear to auscultation bilaterally Cardio: regular rate and rhythm GI: soft, non-tender; bowel sounds normal; no masses,  no organomegaly Extremities: extremities normal, atraumatic, no cyanosis or edema Pulses: 2+ and symmetric Neurologic: Grossly normal  TELEMETRY: Reviewed telemetry pt in normal sinus rhythm with no ischemia  ASSESSMENT AND PLAN:  Active Problems:   Chest pain-patient with chest pain. Underwent a cardiac catheterization which revealed critical stenosis in the right coronary artery. She underwent PCI. She has done well status post placement of drug-eluting stents in her RCA. She has no further chest pain. Catheter site is clean and dry. There is no bruit. Renal function is stable. Creatinine improved to 1.41 from 1.44 yesterday. Patient should ablate this morning. If stable would consider discharge on current regimen including aspirin 81 mg daily, atorvastatin 80 mg daily, clonidine 0.1 mg twice daily, Avapro 300 mg daily, labetalol 100 mg twice daily, ticagrelor 90 mg twice  daily, and when necessary nitrates. She should follow-up with Dr. Towanda Malkin in one week. She is referred to phase II cardiac rehabilitation.    Teodoro Spray, MD, [facility] 10/27/2016 8:42 AM

## 2016-10-27 NOTE — Progress Notes (Signed)
Betty Nielsen 71 y/o female, patient is post heart cath with right groin puncture site. The cath site remained intact without c/o pain, redness or hematoma, Patient has no acute event overnight, remained hemodynamically stable and rested overnight.

## 2016-10-27 NOTE — Progress Notes (Signed)
Patient discharged per MD order and hospital protocol. Patient verbalized understanding of medications, discharge instructions and follow up appointments. Patient escorted off the unit via RN.

## 2016-11-10 ENCOUNTER — Encounter: Payer: Self-pay | Admitting: Family Medicine

## 2016-11-10 ENCOUNTER — Ambulatory Visit (INDEPENDENT_AMBULATORY_CARE_PROVIDER_SITE_OTHER): Payer: Medicare Other | Admitting: Family Medicine

## 2016-11-10 VITALS — BP 118/66 | HR 75 | Temp 97.9°F | Resp 16 | Ht 64.0 in | Wt 198.6 lb

## 2016-11-10 DIAGNOSIS — I10 Essential (primary) hypertension: Secondary | ICD-10-CM

## 2016-11-10 DIAGNOSIS — Z9861 Coronary angioplasty status: Secondary | ICD-10-CM | POA: Diagnosis not present

## 2016-11-10 DIAGNOSIS — R0602 Shortness of breath: Secondary | ICD-10-CM | POA: Diagnosis not present

## 2016-11-10 DIAGNOSIS — J209 Acute bronchitis, unspecified: Secondary | ICD-10-CM | POA: Diagnosis not present

## 2016-11-10 DIAGNOSIS — I252 Old myocardial infarction: Secondary | ICD-10-CM | POA: Diagnosis not present

## 2016-11-10 MED ORDER — BENZONATATE 100 MG PO CAPS: 100.0000 mg | ORAL_CAPSULE | Freq: Two times a day (BID) | ORAL | 0 refills | Status: DC | PRN

## 2016-11-10 MED ORDER — CLONIDINE HCL 0.1 MG PO TABS: 0.1000 mg | ORAL_TABLET | Freq: Two times a day (BID) | ORAL | 5 refills | Status: DC

## 2016-11-10 NOTE — Patient Instructions (Signed)
Check is insurance pays for cardiac rehab

## 2016-11-10 NOTE — Progress Notes (Signed)
Name: Betty Nielsen   MRN: [id number]    DOB: 1946-01-26   Date:11/10/2016       Progress Note  Subjective  Chief Complaint  Chief Complaint  Patient presents with  . Hospitalization Follow-up    HPI   Hospital follow up: she developed left arm and chest pain on 10/25/2016. She went to urgent care thinking it was secondary to shoulder arthritis. She was found to have NSTEMI and was transferred to [facility] [address]. She has angioplasty that showed proximal mid RCA 50 % occlusion and 90 % distal RCA occlusion. She has been compliant with her medications now, including Lipitor, Brillinta and aspirin. She has noticed  easy bruising but no bleeding. She has SOB with activity but not at rest, denies chest pain since she went home.   AR/bronchitis: she has a long history of AR, but states she has been coughing more than usual and has noticed post-nasal drainage. She states it has been going on for weeks, no fever or chills, but has noticed decrease in appetite since visit to [facility]. She has used inhalers in the past but does not like the cost. The sputum and post-nasal drainage is clear.    Patient Active Problem List   Diagnosis Date Noted  . Elevated troponin 10/27/2016  . Essential hypertension, malignant 10/27/2016  . Headache 10/27/2016  . CKD (chronic kidney disease), stage III 10/27/2016  . Hyperlipidemia 10/27/2016  . Hyponatremia 10/27/2016  . Chest pain 10/25/2016  . History of non-ST elevation myocardial infarction (NSTEMI) 10/25/2016  . History of coronary angioplasty 10/25/2016  . BPV (benign positional vertigo) 06/15/2016  . 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[place]) 11/21/2015  . Anemia of chronic disease 06/05/2015  . Acid reflux 06/05/2015  . Hypertension, benign 06/05/2015  . Hypothyroidism, 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  . Major depression in remission ([place]) 06/05/2015  . Obesity (BMI 30.0-34.9) 06/05/2015  . Neuropathy ([place]) 06/05/2015  . Perennial allergic rhinitis 06/05/2015  . Arthritis,  degenerative 07/03/2014  . AAA (abdominal aortic aneurysm) without rupture ([place]) 06/26/2014    Past Surgical History:  Procedure Laterality Date  . ABDOMINAL AORTIC ANEURYSM REPAIR    . ABDOMINAL HYSTERECTOMY    . APPENDECTOMY    . CORONARY STENT INTERVENTION N/A 10/26/2016   Procedure: Coronary Stent Intervention;  Surgeon: Yolonda Kida, MD;  Location: [place] [facility];  Service: Cardiovascular;  Laterality: N/A;  . HERNIA REPAIR    . INNER EAR SURGERY     pt not sure of type  . LEFT HEART CATH AND CORONARY ANGIOGRAPHY N/A 10/26/2016   Procedure: Left Heart Cath and Coronary Angiography;  Surgeon: Teodoro Spray, MD;  Location: [place] [facility];  Service: Cardiovascular;  Laterality: N/A;  . THYROIDECTOMY      Family History  Problem Relation Age of Onset  . Cerebral aneurysm Father   . Hypertension Sister     Social History   Social History  . Marital status: Widowed    Spouse name: N/A  . Number of children: N/A  . Years of education: N/A   Occupational History  . Not on file.   Social History Main Topics  . Smoking status: Former Research scientist (life sciences)  . Smokeless tobacco: Never Used  . Alcohol use No  . Drug use: No  . Sexual activity: Not Currently   Other Topics Concern  . Not on file   Social History Narrative  . No narrative on file     Current Outpatient Prescriptions:  .  acetaminophen (TYLENOL ARTHRITIS  PAIN) 650 MG CR tablet, Take 650 mg by mouth every 8 (eight) hours as needed for pain., Disp: , Rfl:  .  aspirin 81 MG tablet, Take 81 mg by mouth daily., Disp: , Rfl:  .  atorvastatin (LIPITOR) 80 MG tablet, Take 1 tablet (80 mg total) by mouth daily at 6 PM., Disp: 30 tablet, Rfl: 6 .  cholecalciferol (VITAMIN D) 1000 units tablet, Take 1,000 Units by mouth daily., Disp: , Rfl:  .  citalopram (CELEXA) 20 MG tablet, Take 1 tablet (20 mg total) by mouth daily., Disp: 90 tablet, Rfl: 1 .  cloNIDine (CATAPRES) 0.1 MG tablet, Take 2 tablets (0.2 mg total) by  mouth 2 (two) times daily. (Patient taking differently: Take 0.1 mg by mouth 2 (two) times daily. ), Disp: 120 tablet, Rfl: 6 .  irbesartan (AVAPRO) 300 MG tablet, Take 1 tablet (300 mg total) by mouth daily., Disp: 90 tablet, Rfl: 1 .  labetalol (NORMODYNE) 100 MG tablet, Take 1 tablet (100 mg total) by mouth 2 (two) times daily., Disp: 180 tablet, Rfl: 1 .  levothyroxine (SYNTHROID, LEVOTHROID) 75 MCG tablet, Take 1 tablet (75 mcg total) by mouth daily before breakfast., Disp: 30 tablet, Rfl: 6 .  meclizine (ANTIVERT) 12.5 MG tablet, Take 1 tablet (12.5 mg total) by mouth 3 (three) times daily as needed for dizziness., Disp: 15 tablet, Rfl: 0 .  nitroGLYCERIN (NITROSTAT) 0.4 MG SL tablet, Place 1 tablet (0.4 mg total) under the tongue every 5 (five) minutes as needed for chest pain., Disp: 20 tablet, Rfl: 12 .  Omega-3 Fatty Acids (FISH OIL PO), Take 1 capsule by mouth daily., Disp: , Rfl:  .  pantoprazole (PROTONIX) 40 MG tablet, Take 1 tablet (40 mg total) by mouth daily., Disp: 90 tablet, Rfl: 1 .  ticagrelor (BRILINTA) 90 MG TABS tablet, Take 1 tablet (90 mg total) by mouth 2 (two) times daily., Disp: 60 tablet, Rfl: 6  Allergies  Allergen Reactions  . Ace Inhibitors Cough  . Ciprofloxacin Other (See Comments)  . Hydrocodone-Acetaminophen Other (See Comments)  . Norvasc [Amlodipine Besylate]     Pruritus   . Pantoprazole Sodium Other (See Comments)    Cramps  . Pravastatin Other (See Comments)    Stomach cramps  . Sulfa Antibiotics      ROS  Ten systems reviewed and is negative except as mentioned in HPI   Objective  Vitals:   11/10/16 1416  BP: 118/66  Pulse: 75  Resp: 16  Temp: 97.9 F (36.6 C)  SpO2: 98%  Weight: 198 lb 9 oz (90.1 kg)  Height: '5\' 4"'$  (1.626 m)    Body mass index is 34.08 kg/m.  Physical Exam  Constitutional: Patient appears well-developed and well-nourished. Obese  No distress.  HEENT: head atraumatic, normocephalic, pupils equal and reactive  to light,  neck supple, clear rhinorrhea, throat within normal limits Cardiovascular: Normal rate, regular rhythm and normal heart sounds.  No murmur heard. No BLE edema. Pulmonary/Chest: Effort normal and breath sounds normal. No respiratory distress. Abdominal: Soft.  There is no tenderness. Psychiatric: Patient has a normal mood and affect. behavior is normal. Judgment and thought content normal.  Recent Results (from the past 2160 hour(s))  Basic metabolic panel     Status: Abnormal   Collection Time: 10/25/16  2:45 PM  Result Value Ref Range   Sodium 129 (L) 135 - 145 mmol/L   Potassium 4.0 3.5 - 5.1 mmol/L   Chloride 99 (L) 101 - 111 mmol/L  CO2 22 22 - 32 mmol/L   Glucose, Bld 127 (H) 65 - 99 mg/dL   BUN 16 6 - 20 mg/dL   Creatinine, Ser 2.56 (H) 0.44 - 1.00 mg/dL   Calcium 9.2 8.9 - 72.0 mg/dL   GFR calc non Af Amer 33 (L) >60 mL/min   GFR calc Af Amer 39 (L) >60 mL/min    Comment: (NOTE) The eGFR has been calculated using the CKD EPI equation. This calculation has not been validated in all clinical situations. eGFR's persistently <60 mL/min signify possible Chronic Kidney Disease.    Anion gap 8 5 - 15  CBC     Status: Abnormal   Collection Time: 10/25/16  2:45 PM  Result Value Ref Range   WBC 5.5 3.6 - 11.0 K/uL   RBC 3.71 (L) 3.80 - 5.20 MIL/uL   Hemoglobin 11.6 (L) 12.0 - 16.0 g/dL   HCT 91.9 (L) 80.2 - 21.7 %   MCV 89.8 80.0 - 100.0 fL   MCH 31.1 26.0 - 34.0 pg   MCHC 34.7 32.0 - 36.0 g/dL   RDW 98.1 02.5 - 48.6 %   Platelets 310 150 - 440 K/uL  Troponin I     Status: Abnormal   Collection Time: 10/25/16  2:45 PM  Result Value Ref Range   Troponin I 0.16 (HH) <0.03 ng/mL    Comment: CRITICAL RESULT CALLED TO, READ BACK BY AND VERIFIED WITH JANIE BOWEN ON 10/25/16 AT 1523 MNS   Protime-INR     Status: None   Collection Time: 10/25/16  6:29 PM  Result Value Ref Range   Prothrombin Time 13.2 11.4 - 15.2 seconds   INR 1.00   APTT     Status: None    Collection Time: 10/25/16  6:29 PM  Result Value Ref Range   aPTT 27 24 - 36 seconds  Troponin I     Status: Abnormal   Collection Time: 10/25/16  6:29 PM  Result Value Ref Range   Troponin I 0.20 (HH) <0.03 ng/mL    Comment: CRITICAL VALUE NOTED. VALUE IS CONSISTENT WITH PREVIOUSLY REPORTED/CALLED VALUE SRC  TSH     Status: None   Collection Time: 10/25/16  6:29 PM  Result Value Ref Range   TSH 0.739 0.350 - 4.500 uIU/mL    Comment: Performed by a 3rd Generation assay with a functional sensitivity of <=0.01 uIU/mL.  Troponin I     Status: Abnormal   Collection Time: 10/25/16 10:40 PM  Result Value Ref Range   Troponin I 0.17 (HH) <0.03 ng/mL    Comment: CRITICAL VALUE NOTED. VALUE IS CONSISTENT WITH PREVIOUSLY REPORTED/CALLED VALUE TLB   Lipid panel     Status: Abnormal   Collection Time: 10/26/16  3:56 AM  Result Value Ref Range   Cholesterol 328 (H) 0 - 200 mg/dL   Triglycerides 53 <282 mg/dL   HDL 65 >41 mg/dL   Total CHOL/HDL Ratio 5.0 RATIO   VLDL 11 0 - 40 mg/dL   LDL Cholesterol 753 (H) 0 - 99 mg/dL    Comment:        Total Cholesterol/HDL:CHD Risk Coronary Heart Disease Risk Table                     Men   Women  1/2 Average Risk   3.4   3.3  Average Risk       5.0   4.4  2 X Average Risk   9.6   7.1  3 X Average Risk  23.4   11.0        Use the calculated Patient Ratio above and the CHD Risk Table to determine the patient's CHD Risk.        ATP III CLASSIFICATION (LDL):  <100     mg/dL   Optimal  100-129  mg/dL   Near or Above                    Optimal  130-159  mg/dL   Borderline  160-189  mg/dL   High  >190     mg/dL   Very High   Basic metabolic panel     Status: Abnormal   Collection Time: 10/26/16  3:56 AM  Result Value Ref Range   Sodium 128 (L) 135 - 145 mmol/L   Potassium 4.0 3.5 - 5.1 mmol/L   Chloride 99 (L) 101 - 111 mmol/L   CO2 21 (L) 22 - 32 mmol/L   Glucose, Bld 93 65 - 99 mg/dL   BUN 18 6 - 20 mg/dL   Creatinine, Ser 1.44 (H) 0.44  - 1.00 mg/dL   Calcium 9.0 8.9 - 10.3 mg/dL   GFR calc non Af Amer 36 (L) >60 mL/min   GFR calc Af Amer 42 (L) >60 mL/min    Comment: (NOTE) The eGFR has been calculated using the CKD EPI equation. This calculation has not been validated in all clinical situations. eGFR's persistently <60 mL/min signify possible Chronic Kidney Disease.    Anion gap 8 5 - 15  CBC     Status: Abnormal   Collection Time: 10/26/16  3:56 AM  Result Value Ref Range   WBC 6.4 3.6 - 11.0 K/uL   RBC 3.54 (L) 3.80 - 5.20 MIL/uL   Hemoglobin 10.7 (L) 12.0 - 16.0 g/dL   HCT 31.7 (L) 35.0 - 47.0 %   MCV 89.7 80.0 - 100.0 fL   MCH 30.4 26.0 - 34.0 pg   MCHC 33.8 32.0 - 36.0 g/dL   RDW 12.8 11.5 - 14.5 %   Platelets 274 150 - 440 K/uL  Heparin level (unfractionated)     Status: Abnormal   Collection Time: 10/26/16  3:56 AM  Result Value Ref Range   Heparin Unfractionated 1.16 (H) 0.30 - 0.70 IU/mL    Comment:        IF HEPARIN RESULTS ARE BELOW EXPECTED VALUES, AND PATIENT DOSAGE HAS BEEN CONFIRMED, SUGGEST FOLLOW UP TESTING OF ANTITHROMBIN III LEVELS.   POCT Activated clotting time     Status: None   Collection Time: 10/26/16 11:16 AM  Result Value Ref Range   Activated Clotting Time 340 seconds  CBC     Status: Abnormal   Collection Time: 10/27/16  6:51 AM  Result Value Ref Range   WBC 7.1 3.6 - 11.0 K/uL   RBC 3.42 (L) 3.80 - 5.20 MIL/uL   Hemoglobin 10.4 (L) 12.0 - 16.0 g/dL   HCT 30.2 (L) 35.0 - 47.0 %   MCV 88.2 80.0 - 100.0 fL   MCH 30.4 26.0 - 34.0 pg   MCHC 34.4 32.0 - 36.0 g/dL   RDW 13.3 11.5 - 14.5 %   Platelets 263 150 - 440 K/uL  Basic metabolic panel     Status: Abnormal   Collection Time: 10/27/16  6:51 AM  Result Value Ref Range   Sodium 129 (L) 135 - 145 mmol/L   Potassium 4.1 3.5 - 5.1 mmol/L   Chloride 101 101 -  111 mmol/L   CO2 20 (L) 22 - 32 mmol/L   Glucose, Bld 102 (H) 65 - 99 mg/dL   BUN 15 6 - 20 mg/dL   Creatinine, Ser 1.41 (H) 0.44 - 1.00 mg/dL   Calcium 8.7  (L) 8.9 - 10.3 mg/dL   GFR calc non Af Amer 37 (L) >60 mL/min   GFR calc Af Amer 43 (L) >60 mL/min    Comment: (NOTE) The eGFR has been calculated using the CKD EPI equation. This calculation has not been validated in all clinical situations. eGFR's persistently <60 mL/min signify possible Chronic Kidney Disease.    Anion gap 8 5 - 15     PHQ2/9: Depression screen [facility] 2/9 06/15/2016 11/21/2015 10/08/2015 06/05/2015  Decreased Interest 0 0 0 0  Down, Depressed, Hopeless 0 0 0 0  PHQ - 2 Score 0 0 0 0    Fall Risk: Fall Risk  06/15/2016 11/21/2015 10/08/2015 06/05/2015  Falls in the past year? No No No No    Functional Status Survey: Is the patient deaf or have difficulty hearing?: Yes Does the patient have difficulty seeing, even when wearing glasses/contacts?: No Does the patient have difficulty concentrating, remembering, or making decisions?: No Does the patient have difficulty walking or climbing stairs?: Yes Does the patient have difficulty dressing or bathing?: No Does the patient have difficulty doing errands alone such as visiting a doctor's office or shopping?: No   Assessment & Plan  1. History of non-ST elevation myocardial infarction (NSTEMI)  Having SOB with activity, denies chest pain  2. History of coronary angioplasty  No problems with blood thinners.   3. Acute bronchitis, unspecified organism  - benzonatate (TESSALON) 100 MG capsule; Take 1-2 capsules (100-200 mg total) by mouth 2 (two) times daily as needed.  Dispense: 40 capsule; Refill: 0  4. SOB (shortness of breath) on exertion  Explained that it is likely from recent MI not from URI. Discussed cardiac rehab but she wants to think about it  5. Hypertension, benign  - cloNIDine (CATAPRES) 0.1 MG tablet; Take 1 tablet (0.1 mg total) by mouth 2 (two) times daily.  Dispense: 60 tablet; Refill: 5

## 2016-12-06 ENCOUNTER — Encounter: Payer: Medicare Other | Attending: Internal Medicine | Admitting: *Deleted

## 2016-12-06 VITALS — Ht 65.5 in | Wt 192.7 lb

## 2016-12-06 DIAGNOSIS — Z955 Presence of coronary angioplasty implant and graft: Secondary | ICD-10-CM | POA: Diagnosis not present

## 2016-12-06 DIAGNOSIS — Z48812 Encounter for surgical aftercare following surgery on the circulatory system: Secondary | ICD-10-CM | POA: Insufficient documentation

## 2016-12-06 NOTE — Patient Instructions (Signed)
Patient Instructions  Patient Details  Name: Betty Nielsen MRN: [id number] Date of Birth: 1945/10/28 Referring Provider:  Yolonda Kida, MD  Below are the personal goals you chose as well as exercise and nutrition goals. Our goal is to help you keep on track towards obtaining and maintaining your goals. We will be discussing your progress on these goals with you throughout the program.  Initial Exercise Prescription:     Initial Exercise Prescription - 12/06/16 1400      Date of Initial Exercise RX and Referring Provider   Date 12/06/16   Referring Provider Lujean Amel MD     Treadmill   MPH 1.2   Grade 0   Minutes 15   METs 1.92     NuStep   Level 3   SPM 80   Minutes 15   METs 2     Biostep-RELP   Level 1   SPM 50   Minutes 15   METs 2     Prescription Details   Frequency (times per week) 2   Duration Progress to 45 minutes of aerobic exercise without signs/symptoms of physical distress     Intensity   THRR 40-80% of Max Heartrate 105-135   Ratings of Perceived Exertion 11-13   Perceived Dyspnea 0-4     Progression   Progression Continue to progress workloads to maintain intensity without signs/symptoms of physical distress.     Resistance Training   Training Prescription Yes   Weight 2 lbs   Reps 10-15      Exercise Goals: Frequency: Be able to perform aerobic exercise three times per week working toward 3-5 days per week.  Intensity: Work with a perceived exertion of 11 (fairly light) - 15 (hard) as tolerated. Follow your new exercise prescription and watch for changes in prescription as you progress with the program. Changes will be reviewed with you when they are made.  Duration: You should be able to do 30 minutes of continuous aerobic exercise in addition to a 5 minute warm-up and a 5 minute cool-down routine.  Nutrition Goals: Your personal nutrition goals will be established when you do your nutrition analysis with the  dietician.  The following are nutrition guidelines to follow: Cholesterol < 200mg /day Sodium < 1500mg /day Fiber: Women over 50 yrs - 21 grams per day  Personal Goals:     Personal Goals and Risk Factors at Admission - 12/06/16 1309      Core Components/Risk Factors/Patient Goals on Admission    Weight Management Obesity;Yes;Weight Loss   Intervention Weight Management: Develop a combined nutrition and exercise program designed to reach desired caloric intake, while maintaining appropriate intake of nutrient and fiber, sodium and fats, and appropriate energy expenditure required for the weight goal.;Weight Management: Provide education and appropriate resources to help participant work on and attain dietary goals.   Admit Weight 198 lb (89.8 kg)   Goal Weight: Short Term 195 lb (88.5 kg)   Goal Weight: Long Term 180 lb (81.6 kg)   Expected Outcomes Short Term: Continue to assess and modify interventions until short term weight is achieved;Long Term: Adherence to nutrition and physical activity/exercise program aimed toward attainment of established weight goal;Weight Loss: Understanding of general recommendations for a balanced deficit meal plan, which promotes 1-2 lb weight loss per week and includes a negative energy balance of [phone] kcal/d;Understanding recommendations for meals to include 15-35% energy as protein, 25-35% energy from fat, 35-60% energy from carbohydrates, less than 200mg  of dietary cholesterol, 20-35  gm of total fiber daily;Understanding of distribution of calorie intake throughout the day with the consumption of 4-5 meals/snacks   Hypertension Yes   Intervention Provide education on lifestyle modifcations including regular physical activity/exercise, weight management, moderate sodium restriction and increased consumption of fresh fruit, vegetables, and low fat dairy, alcohol moderation, and smoking cessation.;Monitor prescription use compliance.   Expected Outcomes Short  Term: Continued assessment and intervention until BP is < 140/12mm HG in hypertensive participants. < 130/50mm HG in hypertensive participants with diabetes, heart failure or chronic kidney disease.;Long Term: Maintenance of blood pressure at goal levels.   Lipids Yes   Intervention Provide education and support for participant on nutrition & aerobic/resistive exercise along with prescribed medications to achieve LDL 70mg , HDL >40mg .   Expected Outcomes Short Term: Participant states understanding of desired cholesterol values and is compliant with medications prescribed. Participant is following exercise prescription and nutrition guidelines.;Long Term: Cholesterol controlled with medications as prescribed, with individualized exercise RX and with personalized nutrition plan. Value goals: LDL < 70mg , HDL > 40 mg.      Tobacco Use Initial Evaluation: History  Smoking Status  . Former Smoker  Smokeless Tobacco  . Never Used    Copy of goals given to participant.

## 2016-12-06 NOTE — Progress Notes (Signed)
Cardiac Individual Treatment Plan  Patient Details  Name: Betty Nielsen  MRN: [id number] Date of Birth: [birth year] Referring Provider:     Cardiac Rehab from 12/06/2016 in [facility] Cardiac and Pulmonary Rehab  Referring Provider  Lujean Amel MD      Initial Encounter Date:    Cardiac Rehab from 12/06/2016 in [facility] Cardiac and Pulmonary Rehab  Date  12/06/16  Referring Provider  Lujean Amel MD      Visit Diagnosis: Status post coronary artery stent placement  Patient's Home Medications on Admission:  Current Outpatient Prescriptions:  .  acetaminophen (TYLENOL ARTHRITIS PAIN) 650 MG CR tablet, Take 650 mg by mouth every 8 (eight) hours as needed for pain., Disp: , Rfl:  .  aspirin 81 MG tablet, Take 81 mg by mouth daily., Disp: , Rfl:  .  benzonatate (TESSALON) 100 MG capsule, Take 1-2 capsules (100-200 mg total) by mouth 2 (two) times daily as needed., Disp: 40 capsule, Rfl: 0 .  cholecalciferol (VITAMIN D) 1000 units tablet, Take 1,000 Units by mouth daily., Disp: , Rfl:  .  citalopram (CELEXA) 20 MG tablet, Take 1 tablet (20 mg total) by mouth daily., Disp: 90 tablet, Rfl: 1 .  cloNIDine (CATAPRES) 0.1 MG tablet, Take 1 tablet (0.1 mg total) by mouth 2 (two) times daily., Disp: 60 tablet, Rfl: 5 .  irbesartan (AVAPRO) 300 MG tablet, Take 1 tablet (300 mg total) by mouth daily., Disp: 90 tablet, Rfl: 1 .  labetalol (NORMODYNE) 100 MG tablet, Take 1 tablet (100 mg total) by mouth 2 (two) times daily., Disp: 180 tablet, Rfl: 1 .  levothyroxine (SYNTHROID, LEVOTHROID) 75 MCG tablet, Take 1 tablet (75 mcg total) by mouth daily before breakfast., Disp: 30 tablet, Rfl: 6 .  meclizine (ANTIVERT) 12.5 MG tablet, Take 1 tablet (12.5 mg total) by mouth 3 (three) times daily as needed for dizziness., Disp: 15 tablet, Rfl: 0 .  nitroGLYCERIN (NITROSTAT) 0.4 MG SL tablet, Place 1 tablet (0.4 mg total) under the tongue every 5 (five) minutes as needed for chest pain., Disp: 20 tablet, Rfl:  12 .  Omega-3 Fatty Acids (FISH OIL PO), Take 1 capsule by mouth daily., Disp: , Rfl:  .  pantoprazole (PROTONIX) 40 MG tablet, Take 1 tablet (40 mg total) by mouth daily., Disp: 90 tablet, Rfl: 1 .  ticagrelor (BRILINTA) 90 MG TABS tablet, Take 1 tablet (90 mg total) by mouth 2 (two) times daily., Disp: 60 tablet, Rfl: 6 .  atorvastatin (LIPITOR) 80 MG tablet, Take 1 tablet (80 mg total) by mouth daily at 6 PM. (Patient not taking: Reported on 12/06/2016), Disp: 30 tablet, Rfl: 6  Past Medical History: Past Medical History:  Diagnosis Date  . Arthrosis of knee   . Chronic kidney disease   . Hyperlipidemia   . Hypertension   . Loss of hearing   . Metabolic syndrome   . Plantar fasciitis     Tobacco Use: History  Smoking Status  . Former Smoker  Smokeless Tobacco  . Never Used    Labs: Recent [employer] for ITP Cardiac and Pulmonary Rehab Latest Ref Rng & Units 04/15/2008 04/18/2008 04/18/2008 06/15/2016 10/26/2016   Cholestrol 0 - 200 mg/dL - - - 321(H) 328(H)   LDLCALC 0 - 99 mg/dL - - - 233(H) 252(H)   HDL >40 mg/dL - - - 63 65   Trlycerides <150 mg/dL - - - 124 53   PHART - 7.423(H) 7.429(H) 7.322(L) - -  PCO2ART - 38.7 36.3 48.8(H) - -   HCO3 - 24.9(H) 24.5(H) 24.6(H) - -   TCO2 - 26.0 26 26.1 - -   ACIDBASEDEF - - - 0.7 - -   O2SAT - 97.3 100.0 97.9 - -       Exercise Target Goals: Date: 12/06/16  Exercise Program Goal: Individual exercise prescription set with THRR, safety & activity barriers. Participant demonstrates ability to understand and report RPE using BORG scale, to self-measure pulse accurately, and to acknowledge the importance of the exercise prescription.  Exercise Prescription Goal: Starting with aerobic activity 30 plus minutes a day, 3 days per week for initial exercise prescription. Provide home exercise prescription and guidelines that participant acknowledges understanding prior to discharge.  Activity Barriers & Risk  Stratification:     Activity Barriers & Cardiac Risk Stratification - 12/06/16 1323      Activity Barriers & Cardiac Risk Stratification   Activity Barriers Arthritis;Deconditioning;Muscular Weakness;Balance Concerns  Arthritis is all over   Cardiac Risk Stratification High      6 Minute Walk:     6 Minute Walk    Row Name 12/06/16 1454         6 Minute Walk   Phase Initial     Distance 2 feet     Walk Time 5.41 minutes     # of Rest Breaks 5  10 sec, 7 sec, 10 sec, 3 sec, 5 sec     MPH 1.26     METS 1.92     RPE 13     VO2 Peak 5.4     Symptoms Yes (comment)     Comments leg fatigue/pain 10/10 resolved with rest, gait unsteady     Resting HR 75 bpm     Resting BP 134/76     Max Ex. HR 97 bpm     Max Ex. BP 136/80     2 Minute Post BP 126/60        Oxygen Initial Assessment:   Oxygen Re-Evaluation:   Oxygen Discharge (Final Oxygen Re-Evaluation):   Initial Exercise Prescription:     Initial Exercise Prescription - 12/06/16 1400      Date of Initial Exercise RX and Referring Provider   Date 12/06/16   Referring Provider Lujean Amel MD     Treadmill   MPH 1.2   Grade 0   Minutes 15   METs 1.92     NuStep   Level 3   SPM 80   Minutes 15   METs 2     Biostep-RELP   Level 1   SPM 50   Minutes 15   METs 2     Prescription Details   Frequency (times per week) 2   Duration Progress to 45 minutes of aerobic exercise without signs/symptoms of physical distress     Intensity   THRR 40-80% of Max Heartrate 105-135   Ratings of Perceived Exertion 11-13   Perceived Dyspnea 0-4     Progression   Progression Continue to progress workloads to maintain intensity without signs/symptoms of physical distress.     Resistance Training   Training Prescription Yes   Weight 2 lbs   Reps 10-15      Perform Capillary Blood Glucose checks as needed.  Exercise Prescription Changes:      Exercise Prescription Changes    Row Name 12/06/16  1300             Response to Exercise   Blood Pressure (  Admit) 134/76       Blood Pressure (Exercise) 136/80       Blood Pressure (Exit) 126/60       Heart Rate (Admit) 76 bpm       Heart Rate (Exercise) 97 bpm       Heart Rate (Exit) 61 bpm       Oxygen Saturation (Admit) 100 %       Oxygen Saturation (Exercise) 100 %       Rating of Perceived Exertion (Exercise) 13       Symptoms leg fatigue 10/10       Comments walk test results          Exercise Comments:   Exercise Goals and Review:      Exercise Goals    Row Name 12/06/16 1501             Exercise Goals   Increase Physical Activity Yes       Intervention Provide advice, education, support and counseling about physical activity/exercise needs.;Develop an individualized exercise prescription for aerobic and resistive training based on initial evaluation findings, risk stratification, comorbidities and participant's personal goals.       Expected Outcomes Achievement of increased cardiorespiratory fitness and enhanced flexibility, muscular endurance and strength shown through measurements of functional capacity and personal statement of participant.       Increase Strength and Stamina Yes       Intervention Provide advice, education, support and counseling about physical activity/exercise needs.;Develop an individualized exercise prescription for aerobic and resistive training based on initial evaluation findings, risk stratification, comorbidities and participant's personal goals.       Expected Outcomes Achievement of increased cardiorespiratory fitness and enhanced flexibility, muscular endurance and strength shown through measurements of functional capacity and personal statement of participant.          Exercise Goals Re-Evaluation :   Discharge Exercise Prescription (Final Exercise Prescription Changes):     Exercise Prescription Changes - 12/06/16 1300      Response to Exercise   Blood Pressure (Admit)  134/76   Blood Pressure (Exercise) 136/80   Blood Pressure (Exit) 126/60   Heart Rate (Admit) 76 bpm   Heart Rate (Exercise) 97 bpm   Heart Rate (Exit) 61 bpm   Oxygen Saturation (Admit) 100 %   Oxygen Saturation (Exercise) 100 %   Rating of Perceived Exertion (Exercise) 13   Symptoms leg fatigue 10/10   Comments walk test results      Nutrition:  Target Goals: Understanding of nutrition guidelines, daily intake of sodium 1500mg , cholesterol 200mg , calories 30% from fat and 7% or less from saturated fats, daily to have 5 or more servings of fruits and vegetables.  Biometrics:     Pre Biometrics - 12/06/16 1501      Pre Biometrics   Height 5' 5.5" (1.664 m)   Weight 192 lb 11.2 oz (87.4 kg)   Waist Circumference 37 inches   Hip Circumference 43 inches   Waist to Hip Ratio 0.86 %   BMI (Calculated) 31.6   Single Leg Stand 4.93 seconds       Nutrition Therapy Plan and Nutrition Goals:     Nutrition Therapy & Goals - 12/06/16 1308      Intervention Plan   Intervention Prescribe, educate and counsel regarding individualized specific dietary modifications aiming towards targeted core components such as weight, hypertension, lipid management, diabetes, heart failure and other comorbidities.   Expected Outcomes Short Term Goal: Understand basic principles  of dietary content, such as calories, fat, sodium, cholesterol and nutrients.;Short Term Goal: A plan has been developed with personal nutrition goals set during dietitian appointment.;Long Term Goal: Adherence to prescribed nutrition plan.      Nutrition Discharge: Rate Your Plate Scores:     Nutrition Assessments - 12/06/16 1308      MEDFICTS Scores   Pre Score --  Timmothy Sours did not want to complete the questionnaire. "I only eat little bits"       Nutrition Goals Re-Evaluation:   Nutrition Goals Discharge (Final Nutrition Goals Re-Evaluation):   Psychosocial: Target Goals: Acknowledge presence or absence of  significant depression and/or stress, maximize coping skills, provide positive support system. Participant is able to verbalize types and ability to use techniques and skills needed for reducing stress and depression.   Initial Review & Psychosocial Screening:     Initial Psych Review & Screening - 12/06/16 1310      Initial Review   Current issues with None Identified     Family Dynamics   Good Support System? Yes  Son/his wife, adult grand children   Comments Has been widowed for 10 years     Barriers   Psychosocial barriers to participate in program There are no identifiable barriers or psychosocial needs.;The patient should benefit from training in stress management and relaxation.     Screening Interventions   Interventions Encouraged to exercise      Quality of Life Scores:      Quality of Life - 12/06/16 1312      Quality of Life Scores   Health/Function Pre 20.93 %   Socioeconomic Pre 20.83 %   Psych/Spiritual Pre 21 %   Family Pre 21 %   GLOBAL Pre 20.94 %      PHQ-9: Recent Review Flowsheet Data    Depression screen [facility] 2/9 12/06/2016 06/15/2016 11/21/2015 10/08/2015 06/05/2015   Decreased Interest 0 0 0 0 0   Down, Depressed, Hopeless 0 0 0 0 0   PHQ - 2 Score 0 0 0 0 0   Altered sleeping 0 - - - -   Tired, decreased energy 1 - - - -   Change in appetite 0 - - - -   Feeling bad or failure about yourself  0 - - - -   Trouble concentrating 0 - - - -   Moving slowly or fidgety/restless 0 - - - -   Suicidal thoughts 0 - - - -   PHQ-9 Score 1 - - - -   Difficult doing work/chores Not difficult at all - - - -     Interpretation of Total Score  Total Score Depression Severity:  1-4 = Minimal depression, 5-9 = Mild depression, 10-14 = Moderate depression, 15-19 = Moderately severe depression, 20-27 = Severe depression   Psychosocial Evaluation and Intervention:   Psychosocial Re-Evaluation:   Psychosocial Discharge (Final Psychosocial  Re-Evaluation):   Vocational Rehabilitation: Provide vocational rehab assistance to qualifying candidates.   Vocational Rehab Evaluation & Intervention:     Vocational Rehab - 12/06/16 1330      Initial Vocational Rehab Evaluation & Intervention   Assessment shows need for Vocational Rehabilitation No      Education: Education Goals: Education classes will be provided on a weekly basis, covering required topics. Participant will state understanding/return demonstration of topics presented.  Learning Barriers/Preferences:     Learning Barriers/Preferences - 12/06/16 1324      Learning Barriers/Preferences   Learning Barriers Hearing  Wears  heaering aids   Learning Preferences Individual Instruction      Education Topics: General Nutrition Guidelines/Fats and Fiber: -Group instruction provided by verbal, written material, models and posters to present the general guidelines for heart healthy nutrition. Gives an explanation and review of dietary fats and fiber.   Controlling Sodium/Reading Food Labels: -Group verbal and written material supporting the discussion of sodium use in heart healthy nutrition. Review and explanation with models, verbal and written materials for utilization of the food label.   Exercise Physiology & Risk Factors: - Group verbal and written instruction with models to review the exercise physiology of the cardiovascular system and associated critical values. Details cardiovascular disease risk factors and the goals associated with each risk factor.   Aerobic Exercise & Resistance Training: - Gives group verbal and written discussion on the health impact of inactivity. On the components of aerobic and resistive training programs and the benefits of this training and how to safely progress through these programs.   Flexibility, Balance, General Exercise Guidelines: - Provides group verbal and written instruction on the benefits of flexibility and  balance training programs. Provides general exercise guidelines with specific guidelines to those with heart or lung disease. Demonstration and skill practice provided.   Stress Management: - Provides group verbal and written instruction about the health risks of elevated stress, cause of high stress, and healthy ways to reduce stress.   Depression: - Provides group verbal and written instruction on the correlation between heart/lung disease and depressed mood, treatment options, and the stigmas associated with seeking treatment.   Anatomy & Physiology of the Heart: - Group verbal and written instruction and models provide basic cardiac anatomy and physiology, with the coronary electrical and arterial systems. Review of: AMI, Angina, Valve disease, Heart Failure, Cardiac Arrhythmia, Pacemakers, and the ICD.   Cardiac Procedures: - Group verbal and written instruction and models to describe the testing methods done to diagnose heart disease. Reviews the outcomes of the test results. Describes the treatment choices: Medical Management, Angioplasty, or Coronary Bypass Surgery.   Cardiac Medications: - Group verbal and written instruction to review commonly prescribed medications for heart disease. Reviews the medication, class of the drug, and side effects. Includes the steps to properly store meds and maintain the prescription regimen.   Go Sex-Intimacy & Heart Disease, Get SMART - Goal Setting: - Group verbal and written instruction through game format to discuss heart disease and the return to sexual intimacy. Provides group verbal and written material to discuss and apply goal setting through the application of the S.M.A.R.T. Method.   Other Matters of the Heart: - Provides group verbal, written materials and models to describe Heart Failure, Angina, Valve Disease, and Diabetes in the realm of heart disease. Includes description of the disease process and treatment options available to  the cardiac patient.   Exercise & Equipment Safety: - Individual verbal instruction and demonstration of equipment use and safety with use of the equipment.   Cardiac Rehab from 12/06/2016 in [facility] Cardiac and Pulmonary Rehab  Date  12/06/16  Educator  Sb  Instruction Review Code  2- meets goals/outcomes      Infection Prevention: - Provides verbal and written material to individual with discussion of infection control including proper hand washing and proper equipment cleaning during exercise session.   Cardiac Rehab from 12/06/2016 in [facility] Cardiac and Pulmonary Rehab  Date  12/06/16  Educator  Sb  Instruction Review Code  2- meets goals/outcomes  Falls Prevention: - Provides verbal and written material to individual with discussion of falls prevention and safety.   Cardiac Rehab from 12/06/2016 in [facility] Cardiac and Pulmonary Rehab  Date  12/06/16  Educator  SB  Instruction Review Code  2- meets goals/outcomes      Diabetes: - Individual verbal and written instruction to review signs/symptoms of diabetes, desired ranges of glucose level fasting, after meals and with exercise. Advice that pre and post exercise glucose checks will be done for 3 sessions at entry of program.    Knowledge Questionnaire Score:     Knowledge Questionnaire Score - 12/06/16 1330      Knowledge Questionnaire Score   Pre Score 15/28  Reviewed correct responses with Timmothy Sours today. She vebalized understanding of the responses      Core Components/Risk Factors/Patient Goals at Admission:     Personal Goals and Risk Factors at Admission - 12/06/16 1309      Core Components/Risk Factors/Patient Goals on Admission    Weight Management Obesity;Yes;Weight Loss   Intervention Weight Management: Develop a combined nutrition and exercise program designed to reach desired caloric intake, while maintaining appropriate intake of nutrient and fiber, sodium and fats, and appropriate energy expenditure required  for the weight goal.;Weight Management: Provide education and appropriate resources to help participant work on and attain dietary goals.   Admit Weight 198 lb (89.8 kg)   Goal Weight: Short Term 195 lb (88.5 kg)   Goal Weight: Long Term 180 lb (81.6 kg)   Expected Outcomes Short Term: Continue to assess and modify interventions until short term weight is achieved;Long Term: Adherence to nutrition and physical activity/exercise program aimed toward attainment of established weight goal;Weight Loss: Understanding of general recommendations for a balanced deficit meal plan, which promotes 1-2 lb weight loss per week and includes a negative energy balance of [id number] kcal/d;Understanding recommendations for meals to include 15-35% energy as protein, 25-35% energy from fat, 35-60% energy from carbohydrates, less than 200mg  of dietary cholesterol, 20-35 gm of total fiber daily;Understanding of distribution of calorie intake throughout the day with the consumption of 4-5 meals/snacks   Hypertension Yes   Intervention Provide education on lifestyle modifcations including regular physical activity/exercise, weight management, moderate sodium restriction and increased consumption of fresh fruit, vegetables, and low fat dairy, alcohol moderation, and smoking cessation.;Monitor prescription use compliance.   Expected Outcomes Short Term: Continued assessment and intervention until BP is < 140/59mm HG in hypertensive participants. < 130/14mm HG in hypertensive participants with diabetes, heart failure or chronic kidney disease.;Long Term: Maintenance of blood pressure at goal levels.   Lipids Yes   Intervention Provide education and support for participant on nutrition & aerobic/resistive exercise along with prescribed medications to achieve LDL 70mg , HDL >40mg .   Expected Outcomes Short Term: Participant states understanding of desired cholesterol values and is compliant with medications prescribed. Participant is  following exercise prescription and nutrition guidelines.;Long Term: Cholesterol controlled with medications as prescribed, with individualized exercise RX and with personalized nutrition plan. Value goals: LDL < 70mg , HDL > 40 mg.      Core Components/Risk Factors/Patient Goals Review:    Core Components/Risk Factors/Patient Goals at Discharge (Final Review):    ITP Comments:     ITP Comments    Row Name 12/06/16 1301 12/06/16 1333         ITP Comments Meical review completed with initial ITP created. Diagnosis documentation can be found in [facility] Encounter 10/25/2016 Medical review completed with initial ITP created. Diagnosis documentation  can be found in [facility] Encounter 10/25/2016         Comments: Initial ITP

## 2016-12-06 NOTE — Progress Notes (Signed)
Daily Session Note  Patient Details  Name: Betty Nielsen MRN: [id number] Date of Birth: 28-Nov-1945 Referring Provider:    Encounter Date: 12/06/2016  Check In:     Session Check In - 12/06/16 1058      Check-In   Location [facility]-Cardiac & Pulmonary Rehab   Staff Present Alberteen Sam, MA, ACSM RCEP, Exercise Physiologist;Tykwon Fera, RN, BSN;Susanne Bice, RN, BSN, [employer]   Supervising physician immediately available to respond to emergencies See telemetry face sheet for immediately available ER MD   Warm-up and Cool-down Performed as group-led instruction   Resistance Training Performed Yes   VAD Patient? No         History  Smoking Status  . Former Smoker  Smokeless Tobacco  . Never Used    Goals Met:  Exercise tolerated well Personal goals reviewed No report of cardiac concerns or symptoms Strength training completed today  Goals Unmet:  Not Applicable  Comments:    Dr. Emily Filbert is Medical Director for [facility].

## 2016-12-09 ENCOUNTER — Encounter: Payer: Medicare Other | Admitting: *Deleted

## 2016-12-09 DIAGNOSIS — Z48812 Encounter for surgical aftercare following surgery on the circulatory system: Secondary | ICD-10-CM | POA: Diagnosis not present

## 2016-12-09 DIAGNOSIS — Z955 Presence of coronary angioplasty implant and graft: Secondary | ICD-10-CM

## 2016-12-09 NOTE — Progress Notes (Signed)
Daily Session Note  Patient Details  Name: Betty Nielsen MRN: [id number] Date of Birth: 07-31-1946 Referring Provider:     Cardiac Rehab from 12/06/2016 in [facility] Cardiac and Pulmonary Rehab  Referring Provider  Lujean Amel MD      Encounter Date: 12/09/2016  Check In:     Session Check In - 12/09/16 0819      Check-In   Location [facility]-Cardiac & Pulmonary Rehab   Staff Present Alberteen Sam, MA, ACSM RCEP, Exercise Physiologist;Other;Amanda Oletta Darter, BA, ACSM CEP, Exercise Physiologist  Darel Hong, RN   Supervising physician immediately available to respond to emergencies See telemetry face sheet for immediately available ER MD   Medication changes reported     No   Fall or balance concerns reported    No   Warm-up and Cool-down Performed on first and last piece of equipment   Resistance Training Performed Yes   VAD Patient? No     Pain Assessment   Currently in Pain? No/denies   Multiple Pain Sites No         History  Smoking Status  . Former Smoker  Smokeless Tobacco  . Never Used    Goals Met:  Exercise tolerated well Personal goals reviewed No report of cardiac concerns or symptoms Strength training completed today  Goals Unmet:  Not Applicable  Comments: First full day of exercise!  Patient was oriented to gym and equipment including functions, settings, policies, and procedures.  Patient's individual exercise prescription and treatment plan were reviewed.  All starting workloads were established based on the results of the 6 minute walk test done at initial orientation visit.  The plan for exercise progression was also introduced and progression will be customized based on patient's performance and goals.    Dr. Emily Filbert is Medical Director for [facility].

## 2016-12-14 ENCOUNTER — Encounter: Payer: Self-pay | Admitting: Family Medicine

## 2016-12-14 ENCOUNTER — Encounter: Payer: Medicare Other | Admitting: Respiratory Therapy

## 2016-12-14 ENCOUNTER — Ambulatory Visit (INDEPENDENT_AMBULATORY_CARE_PROVIDER_SITE_OTHER): Payer: Medicare Other | Admitting: Family Medicine

## 2016-12-14 VITALS — BP 132/64 | HR 73 | Temp 97.3°F | Resp 16 | Ht 66.0 in | Wt 193.4 lb

## 2016-12-14 DIAGNOSIS — K219 Gastro-esophageal reflux disease without esophagitis: Secondary | ICD-10-CM

## 2016-12-14 DIAGNOSIS — E890 Postprocedural hypothyroidism: Secondary | ICD-10-CM | POA: Diagnosis not present

## 2016-12-14 DIAGNOSIS — Z48812 Encounter for surgical aftercare following surgery on the circulatory system: Secondary | ICD-10-CM | POA: Diagnosis not present

## 2016-12-14 DIAGNOSIS — I10 Essential (primary) hypertension: Secondary | ICD-10-CM

## 2016-12-14 DIAGNOSIS — F325 Major depressive disorder, single episode, in full remission: Secondary | ICD-10-CM

## 2016-12-14 DIAGNOSIS — N183 Chronic kidney disease, stage 3 unspecified: Secondary | ICD-10-CM

## 2016-12-14 DIAGNOSIS — I252 Old myocardial infarction: Secondary | ICD-10-CM

## 2016-12-14 DIAGNOSIS — I714 Abdominal aortic aneurysm, without rupture, unspecified: Secondary | ICD-10-CM

## 2016-12-14 DIAGNOSIS — E7801 Familial hypercholesterolemia: Secondary | ICD-10-CM | POA: Diagnosis not present

## 2016-12-14 DIAGNOSIS — Z955 Presence of coronary angioplasty implant and graft: Secondary | ICD-10-CM

## 2016-12-14 MED ORDER — LABETALOL HCL 100 MG PO TABS: 100.0000 mg | ORAL_TABLET | Freq: Two times a day (BID) | ORAL | 1 refills | Status: DC

## 2016-12-14 MED ORDER — CITALOPRAM HYDROBROMIDE 20 MG PO TABS: 20.0000 mg | ORAL_TABLET | Freq: Every day | ORAL | 1 refills | Status: DC

## 2016-12-14 MED ORDER — IRBESARTAN 300 MG PO TABS: 300.0000 mg | ORAL_TABLET | Freq: Every day | ORAL | 1 refills | Status: DC

## 2016-12-14 MED ORDER — PANTOPRAZOLE SODIUM 40 MG PO TBEC: 40.0000 mg | DELAYED_RELEASE_TABLET | Freq: Every day | ORAL | 1 refills | Status: DC

## 2016-12-14 NOTE — Patient Instructions (Signed)
Evolocumab injection What is this medicine? EVOLOCUMAB (e voe LOK ue mab) is known as a PCSK9 inhibitor. It is used to lower the level of cholesterol in the blood. It may be used alone or in combination with other cholesterol-lowering drugs. This drug may also be used to reduce the risk of heart attack, stroke, and certain types of heart surgery in patients with heart disease. This medicine may be used for other purposes; ask your health care provider or pharmacist if you have questions. COMMON BRAND NAME(S): REPATHA What should I tell my health care provider before I take this medicine? They need to know if you have any of these conditions: -an unusual or allergic reaction to evolocumab, other medicines, foods, dyes, or preservatives -pregnant or trying to get pregnant -breast-feeding How should I use this medicine? This medicine is for injection under the skin. You will be taught how to prepare and give this medicine. Use exactly as directed. Take your medicine at regular intervals. Do not take your medicine more often than directed. It is important that you put your used needles and syringes in a special sharps container. Do not put them in a trash can. If you do not have a sharps container, call your pharmacist or health care provider to get one. Talk to your pediatrician regarding the use of this medicine in children. While this drug may be prescribed for children as young as 13 years for selected conditions, precautions do apply. Overdosage: If you think you have taken too much of this medicine contact a poison control center or emergency room at once. NOTE: This medicine is only for you. Do not share this medicine with others. What if I miss a dose? If you miss a dose, take it as soon as you can if there are more than 7 days until the next scheduled dose, or skip the missed dose and take the next dose according to your original schedule. Do not take double or extra doses. What may interact  with this medicine? Interactions are not expected. This list may not describe all possible interactions. Give your health care provider a list of all the medicines, herbs, non-prescription drugs, or dietary supplements you use. Also tell them if you smoke, drink alcohol, or use illegal drugs. Some items may interact with your medicine. What should I watch for while using this medicine? You may need blood work while you are taking this medicine. What side effects may I notice from receiving this medicine? Side effects that you should report to your doctor or health care professional as soon as possible: -allergic reactions like skin rash, itching or hives, swelling of the face, lips, or tongue -signs and symptoms of infection like fever or chills; cough; sore throat; pain or trouble passing urine Side effects that usually do not require medical attention (report to your doctor or health care professional if they continue or are bothersome): -diarrhea -nausea -muscle pain -pain, redness, or irritation at site where injected This list may not describe all possible side effects. Call your doctor for medical advice about side effects. You may report side effects to FDA at 1-800-FDA-1088. Where should I keep my medicine? Keep out of the reach of children. You will be instructed on how to store this medicine. Throw away any unused medicine after the expiration date on the label. NOTE: This sheet is a summary. It may not cover all possible information. If you have questions about this medicine, talk to your doctor, pharmacist, or health care   provider.  2018 Elsevier/Gold Standard (2016-08-23 13:21:53)  

## 2016-12-14 NOTE — Progress Notes (Signed)
Daily Session Note  Patient Details  Name: ENIJAH FURR MRN: [id number] Date of Birth: 1946-04-24 Referring Provider:     Cardiac Rehab from 12/06/2016 in [facility]  Referring Provider  Lujean Amel MD      Encounter Date: 12/14/2016  Check In:     Session Check In - 12/14/16 0824      Check-In   Location [facility]-Cardiac & Pulmonary Rehab   Staff Present Carson Myrtle, BS, RRT, Respiratory Lennie Hummer, MA, ACSM RCEP, Exercise Physiologist;Susanne Bice, RN, BSN, CCRP   Supervising physician immediately available to respond to emergencies See telemetry face sheet for immediately available ER MD   Medication changes reported     No   Fall or balance concerns reported    No   Warm-up and Cool-down Performed on first and last piece of equipment   Resistance Training Performed Yes   VAD Patient? No     Pain Assessment   Currently in Pain? No/denies   Multiple Pain Sites No         History  Smoking Status   Former Smoker  Smokeless Tobacco   Never Used    Goals Met:  Proper associated with RPD/PD & O2 Sat Independence with exercise equipment Exercise tolerated well Personal goals reviewed No report of cardiac concerns or symptoms Strength training completed today  Goals Unmet:  Not Applicable  Comments: Pt able to follow exercise prescription today without complaint.  Will continue to monitor for progression.   Dr. Emily Filbert is Medical Director for [facility].

## 2016-12-14 NOTE — Progress Notes (Signed)
Name: Betty Nielsen   MRN: [id number]    DOB: Jan 25, 1946   Date:12/14/2016       Progress Note  Subjective  Chief Complaint  Chief Complaint  Patient presents with  . Medication Refill    4 month F/U  . Hypertension    Denies any symptoms  . Hyperlipidemia    Does not like taking the statins due to muscle cramps and quit taking them.  . Depression    Improving  . Gastroesophageal Reflux    Has not had any symptoms lately  . Shoulder Pain    Since Heart attack, her left shoulder will hurt and be uncomfortable.  Doing PT since February 2018 at [facility] on Tuesday's and Thursday  . Hypothyroidism    Stable    HPI   HTN: patient has been taking medications as prescribed and is doing well, bp is at goal. No chest pain, no palpitation, she has SOB and decrease in exercise tolerance since MI 10/2016  Hyperlipidemia: LDL dangerously high, but patient is not willing to try statin, too scared of side effects . She also has atherosclerosis of abdominal aorta, discussed risk of strokes and heart attacks, explained we have other agents besides statins to improve LLD , but she refuses therapy   Hypothyroidism: she is s/p thyroidectomy was having labs done by Dr. Pryor Ochoa, Compliant with medication, denies dry skin, states constipation has been controlled with Miralax. Last TSH within normal range done while admitted 10/2016  GERD: under control at this time, no heartburn or regurgitation.   Depression: taking Celexa, she states she is in remission at this time. 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 She denies sadness, crying spells, change in appetite, or suicidal thoughts. She has been feeling tired but states since heart attack.   CKI: sees Dr. Abigail Butts    Patient Active Problem List   Diagnosis Date Noted  . Elevated troponin 10/27/2016  . Essential hypertension, malignant 10/27/2016  . Headache 10/27/2016  . CKD (chronic kidney disease), stage III 10/27/2016  .  Hyperlipidemia 10/27/2016  . Hyponatremia 10/27/2016  . History of non-ST elevation myocardial infarction (NSTEMI) 10/25/2016  . History of coronary angioplasty 10/25/2016  . BPV (benign positional vertigo) 06/15/2016  . 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[place]) 11/21/2015  . Anemia of chronic disease 06/05/2015  . Acid reflux 06/05/2015  . Hypertension, benign 06/05/2015  . Hypothyroidism, 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  . Major depression in remission ([place]) 06/05/2015  . Obesity (BMI 30.0-34.9) 06/05/2015  . Neuropathy ([place]) 06/05/2015  . Perennial allergic rhinitis 06/05/2015  . Arthritis, degenerative 07/03/2014  . AAA (abdominal aortic aneurysm) without rupture ([place]) 06/26/2014    Past Surgical History:  Procedure Laterality Date  . ABDOMINAL AORTIC ANEURYSM REPAIR    . ABDOMINAL HYSTERECTOMY    . APPENDECTOMY    . CORONARY STENT INTERVENTION N/A 10/26/2016   Procedure: Coronary Stent Intervention;  Surgeon: Yolonda Kida, MD;  Location: [facility];  Service: Cardiovascular;  Laterality: N/A;  . HERNIA REPAIR    . INNER EAR SURGERY     pt not sure of type  . LEFT HEART CATH AND CORONARY ANGIOGRAPHY N/A 10/26/2016   Procedure: Left Heart Cath and Coronary Angiography;  Surgeon: Teodoro Spray, MD;  Location: [facility];  Service: Cardiovascular;  Laterality: N/A;  . THYROIDECTOMY      Family History  Problem Relation Age of Onset  . Cerebral aneurysm Father   . Hypertension Sister  Social History   Social History  . Marital status: Widowed    Spouse name: N/A  . Number of children: N/A  . Years of education: N/A   Occupational History  . Not on file.   Social History Main Topics  . Smoking status: Former Research scientist (life sciences)  . Smokeless tobacco: Never Used  . Alcohol use No  . Drug use: No  . Sexual activity: Not Currently   Other Topics Concern  . Not on file   Social History Narrative  . No narrative on file     Current Outpatient  Prescriptions:  .  acetaminophen (TYLENOL ARTHRITIS PAIN) 650 MG CR tablet, Take 650 mg by mouth every 8 (eight) hours as needed for pain., Disp: , Rfl:  .  aspirin 81 MG tablet, Take 81 mg by mouth daily., Disp: , Rfl:  .  cholecalciferol (VITAMIN D) 1000 units tablet, Take 1,000 Units by mouth daily., Disp: , Rfl:  .  citalopram (CELEXA) 20 MG tablet, Take 1 tablet (20 mg total) by mouth daily., Disp: 90 tablet, Rfl: 1 .  cloNIDine (CATAPRES) 0.1 MG tablet, Take 1 tablet (0.1 mg total) by mouth 2 (two) times daily., Disp: 60 tablet, Rfl: 5 .  irbesartan (AVAPRO) 300 MG tablet, Take 1 tablet (300 mg total) by mouth daily., Disp: 90 tablet, Rfl: 1 .  labetalol (NORMODYNE) 100 MG tablet, Take 1 tablet (100 mg total) by mouth 2 (two) times daily., Disp: 180 tablet, Rfl: 1 .  levothyroxine (SYNTHROID, LEVOTHROID) 75 MCG tablet, Take 1 tablet (75 mcg total) by mouth daily before breakfast., Disp: 30 tablet, Rfl: 6 .  meclizine (ANTIVERT) 12.5 MG tablet, Take 1 tablet (12.5 mg total) by mouth 3 (three) times daily as needed for dizziness., Disp: 15 tablet, Rfl: 0 .  nitroGLYCERIN (NITROSTAT) 0.4 MG SL tablet, Place 1 tablet (0.4 mg total) under the tongue every 5 (five) minutes as needed for chest pain., Disp: 20 tablet, Rfl: 12 .  Omega-3 Fatty Acids (FISH OIL PO), Take 1 capsule by mouth daily., Disp: , Rfl:  .  pantoprazole (PROTONIX) 40 MG tablet, Take 1 tablet (40 mg total) by mouth daily., Disp: 90 tablet, Rfl: 1 .  ticagrelor (BRILINTA) 90 MG TABS tablet, Take 1 tablet (90 mg total) by mouth 2 (two) times daily., Disp: 60 tablet, Rfl: 6  Allergies  Allergen Reactions  . Ace Inhibitors Cough  . Ciprofloxacin Other (See Comments)  . Hydrocodone-Acetaminophen Other (See Comments)  . Norvasc [Amlodipine Besylate]     Pruritus   . Pantoprazole Sodium Other (See Comments)    Cramps  . Pravastatin Other (See Comments)    Stomach cramps  . Sulfa Antibiotics      ROS  Constitutional:  Negative for fever or weight change.  Respiratory: Negative for cough and shortness of breath.   Cardiovascular: Negative for chest pain or palpitations.  Gastrointestinal: Negative for abdominal pain, no bowel changes.  Musculoskeletal: Negative for gait problem or joint swelling.  Skin: Negative for rash.  Neurological: Negative for dizziness or headache.  No other specific complaints in a complete review of systems (except as listed in HPI above).  Objective  Vitals:   12/14/16 1335  BP: 132/64  Pulse: 73  Resp: 16  Temp: 97.3 F (36.3 C)  TempSrc: Oral  SpO2: 95%  Weight: 193 lb 6.4 oz (87.7 kg)  Height: '5\' 6"'$  (1.676 m)    Body mass index is 31.22 kg/m.  Physical Exam  Constitutional: Patient appears well-developed and  well-nourished. Obese No distress.  HEENT: head atraumatic, normocephalic, pupils equal and reactive to light,  neck supple, throat within normal limits Cardiovascular: Normal rate, regular rhythm and normal heart sounds.  No murmur heard. No BLE edema. Pulmonary/Chest: Effort normal and breath sounds normal. No respiratory distress. Abdominal: Soft.  There is no tenderness. Psychiatric: Patient has a normal mood and affect. behavior is normal. Judgment and thought content normal.  Recent Results (from the past 2160 hour(s))  Basic metabolic panel     Status: Abnormal   Collection Time: 10/25/16  2:45 PM  Result Value Ref Range   Sodium 129 (L) 135 - 145 mmol/L   Potassium 4.0 3.5 - 5.1 mmol/L   Chloride 99 (L) 101 - 111 mmol/L   CO2 22 22 - 32 mmol/L   Glucose, Bld 127 (H) 65 - 99 mg/dL   BUN 16 6 - 20 mg/dL   Creatinine, Ser 1.53 (H) 0.44 - 1.00 mg/dL   Calcium 9.2 8.9 - 10.3 mg/dL   GFR calc non Af Amer 33 (L) >60 mL/min   GFR calc Af Amer 39 (L) >60 mL/min    Comment: (NOTE) The eGFR has been calculated using the CKD EPI equation. This calculation has not been validated in all clinical situations. eGFR's persistently <60 mL/min signify  possible Chronic Kidney Disease.    Anion gap 8 5 - 15  CBC     Status: Abnormal   Collection Time: 10/25/16  2:45 PM  Result Value Ref Range   WBC 5.5 3.6 - 11.0 K/uL   RBC 3.71 (L) 3.80 - 5.20 MIL/uL   Hemoglobin 11.6 (L) 12.0 - 16.0 g/dL   HCT 33.3 (L) 35.0 - 47.0 %   MCV 89.8 80.0 - 100.0 fL   MCH 31.1 26.0 - 34.0 pg   MCHC 34.7 32.0 - 36.0 g/dL   RDW 13.1 11.5 - 14.5 %   Platelets 310 150 - 440 K/uL  Troponin I     Status: Abnormal   Collection Time: 10/25/16  2:45 PM  Result Value Ref Range   Troponin I 0.16 (HH) <0.03 ng/mL    Comment: CRITICAL RESULT CALLED TO, READ BACK BY AND VERIFIED WITH JANIE BOWEN ON 10/25/16 AT 37 MNS   Protime-INR     Status: None   Collection Time: 10/25/16  6:29 PM  Result Value Ref Range   Prothrombin Time 13.2 11.4 - 15.2 seconds   INR 1.00   APTT     Status: None   Collection Time: 10/25/16  6:29 PM  Result Value Ref Range   aPTT 27 24 - 36 seconds  Troponin I     Status: Abnormal   Collection Time: 10/25/16  6:29 PM  Result Value Ref Range   Troponin I 0.20 (HH) <0.03 ng/mL    Comment: CRITICAL VALUE NOTED. VALUE IS CONSISTENT WITH PREVIOUSLY REPORTED/CALLED VALUE [place]  TSH     Status: None   Collection Time: 10/25/16  6:29 PM  Result Value Ref Range   TSH 0.739 0.350 - 4.500 uIU/mL    Comment: Performed by a 3rd Generation assay with a functional sensitivity of <=0.01 uIU/mL.  Troponin I     Status: Abnormal   Collection Time: 10/25/16 10:40 PM  Result Value Ref Range   Troponin I 0.17 (HH) <0.03 ng/mL    Comment: CRITICAL VALUE NOTED. VALUE IS CONSISTENT WITH PREVIOUSLY REPORTED/CALLED VALUE TLB   Lipid panel     Status: Abnormal   Collection Time: 10/26/16  3:56 AM  Result Value Ref Range   Cholesterol 328 (H) 0 - 200 mg/dL   Triglycerides 53 <150 mg/dL   HDL 65 >40 mg/dL   Total CHOL/HDL Ratio 5.0 RATIO   VLDL 11 0 - 40 mg/dL   LDL Cholesterol 252 (H) 0 - 99 mg/dL    Comment:        Total Cholesterol/HDL:CHD  Risk Coronary Heart Disease Risk Table                     Men   Women  1/2 Average Risk   3.4   3.3  Average Risk       5.0   4.4  2 X Average Risk   9.6   7.1  3 X Average Risk  23.4   11.0        Use the calculated Patient Ratio above and the CHD Risk Table to determine the patient's CHD Risk.        ATP III CLASSIFICATION (LDL):  <100     mg/dL   Optimal  100-129  mg/dL   Near or Above                    Optimal  130-159  mg/dL   Borderline  160-189  mg/dL   High  >190     mg/dL   Very High   Basic metabolic panel     Status: Abnormal   Collection Time: 10/26/16  3:56 AM  Result Value Ref Range   Sodium 128 (L) 135 - 145 mmol/L   Potassium 4.0 3.5 - 5.1 mmol/L   Chloride 99 (L) 101 - 111 mmol/L   CO2 21 (L) 22 - 32 mmol/L   Glucose, Bld 93 65 - 99 mg/dL   BUN 18 6 - 20 mg/dL   Creatinine, Ser 1.44 (H) 0.44 - 1.00 mg/dL   Calcium 9.0 8.9 - 10.3 mg/dL   GFR calc non Af Amer 36 (L) >60 mL/min   GFR calc Af Amer 42 (L) >60 mL/min    Comment: (NOTE) The eGFR has been calculated using the CKD EPI equation. This calculation has not been validated in all clinical situations. eGFR's persistently <60 mL/min signify possible Chronic Kidney Disease.    Anion gap 8 5 - 15  CBC     Status: Abnormal   Collection Time: 10/26/16  3:56 AM  Result Value Ref Range   WBC 6.4 3.6 - 11.0 K/uL   RBC 3.54 (L) 3.80 - 5.20 MIL/uL   Hemoglobin 10.7 (L) 12.0 - 16.0 g/dL   HCT 31.7 (L) 35.0 - 47.0 %   MCV 89.7 80.0 - 100.0 fL   MCH 30.4 26.0 - 34.0 pg   MCHC 33.8 32.0 - 36.0 g/dL   RDW 12.8 11.5 - 14.5 %   Platelets 274 150 - 440 K/uL  Heparin level (unfractionated)     Status: Abnormal   Collection Time: 10/26/16  3:56 AM  Result Value Ref Range   Heparin Unfractionated 1.16 (H) 0.30 - 0.70 IU/mL    Comment:        IF HEPARIN RESULTS ARE BELOW EXPECTED VALUES, AND PATIENT DOSAGE HAS BEEN CONFIRMED, SUGGEST FOLLOW UP TESTING OF ANTITHROMBIN III LEVELS.   POCT Activated clotting  time     Status: None   Collection Time: 10/26/16 11:16 AM  Result Value Ref Range   Activated Clotting Time 340 seconds  CBC     Status: Abnormal  Collection Time: 10/27/16  6:51 AM  Result Value Ref Range   WBC 7.1 3.6 - 11.0 K/uL   RBC 3.42 (L) 3.80 - 5.20 MIL/uL   Hemoglobin 10.4 (L) 12.0 - 16.0 g/dL   HCT 36.5 (L) 36.6 - 40.1 %   MCV 88.2 80.0 - 100.0 fL   MCH 30.4 26.0 - 34.0 pg   MCHC 34.4 32.0 - 36.0 g/dL   RDW 77.6 52.8 - 59.1 %   Platelets 263 150 - 440 K/uL  Basic metabolic panel     Status: Abnormal   Collection Time: 10/27/16  6:51 AM  Result Value Ref Range   Sodium 129 (L) 135 - 145 mmol/L   Potassium 4.1 3.5 - 5.1 mmol/L   Chloride 101 101 - 111 mmol/L   CO2 20 (L) 22 - 32 mmol/L   Glucose, Bld 102 (H) 65 - 99 mg/dL   BUN 15 6 - 20 mg/dL   Creatinine, Ser 8.60 (H) 0.44 - 1.00 mg/dL   Calcium 8.7 (L) 8.9 - 10.3 mg/dL   GFR calc non Af Amer 37 (L) >60 mL/min   GFR calc Af Amer 43 (L) >60 mL/min    Comment: (NOTE) The eGFR has been calculated using the CKD EPI equation. This calculation has not been validated in all clinical situations. eGFR's persistently <60 mL/min signify possible Chronic Kidney Disease.    Anion gap 8 5 - 15      PHQ2/9: Depression screen [facility] 2/9 12/06/2016 06/15/2016 11/21/2015 10/08/2015 06/05/2015  Decreased Interest 0 0 0 0 0  Down, Depressed, Hopeless 0 0 0 0 0  PHQ - 2 Score 0 0 0 0 0  Altered sleeping 0 - - - -  Tired, decreased energy 1 - - - -  Change in appetite 0 - - - -  Feeling bad or failure about yourself  0 - - - -  Trouble concentrating 0 - - - -  Moving slowly or fidgety/restless 0 - - - -  Suicidal thoughts 0 - - - -  PHQ-9 Score 1 - - - -  Difficult doing work/chores Not difficult at all - - - -     Fall Risk: Fall Risk  12/06/2016 06/15/2016 11/21/2015 10/08/2015 06/05/2015  Falls in the past year? No No No No No      Assessment & Plan  1. History of non-ST elevation myocardial infarction  (NSTEMI)  10/2016, she is on cardiac rehab, but still feels tired   2. Hypertension, benign  - irbesartan (AVAPRO) 300 MG tablet; Take 1 tablet (300 mg total) by mouth daily.  Dispense: 90 tablet; Refill: 1 - labetalol (NORMODYNE) 100 MG tablet; Take 1 tablet (100 mg total) by mouth 2 (two) times daily.  Dispense: 180 tablet; Refill: 1  3. Major depression in remission (HCC)  - citalopram (CELEXA) 20 MG tablet; Take 1 tablet (20 mg total) by mouth daily.  Dispense: 90 tablet; Refill: 1  4. Chronic kidney disease, stage 3 (moderate)  We will recheck labs  5. Hypothyroidism, postsurgical  Last TSH is at goal   6. AAA (abdominal aortic aneurysm) without rupture (HCC)  She refuses statin therapy   7. CKD (chronic kidney disease), stage III  Seeing Dr. Ronn Melena  8. Gastroesophageal reflux disease without esophagitis  Stable on medications, discussed possible side effects, but she does not want to stop medication - pantoprazole (PROTONIX) 40 MG tablet; Take 1 tablet (40 mg total) by mouth daily.  Dispense: 90 tablet; Refill: 1  9. Familial hypercholesterolemia  Explained that she needs to decrease LDL, she is high risk, explained that there are different classes of lipid lowering medication and it does not have to be a statin therapy, however she refuses any agent to treat hyperlipidemia

## 2016-12-15 ENCOUNTER — Encounter: Payer: Self-pay | Admitting: *Deleted

## 2016-12-15 DIAGNOSIS — Z955 Presence of coronary angioplasty implant and graft: Secondary | ICD-10-CM

## 2016-12-15 NOTE — Progress Notes (Signed)
Cardiac Individual Treatment Plan  Patient Details  Name: Betty Nielsen MRN: [id number] Date of Birth: [birth year] Referring Provider:     Cardiac Rehab from 12/06/2016 in [facility] Cardiac and Pulmonary Rehab  Referring Provider  Lujean Amel MD      Initial Encounter Date:    Cardiac Rehab from 12/06/2016 in [facility] Cardiac and Pulmonary Rehab  Date  12/06/16  Referring Provider  Lujean Amel MD      Visit Diagnosis: Status post coronary artery stent placement  Patient's Home Medications on Admission:  Current Outpatient Prescriptions:  .  acetaminophen (TYLENOL ARTHRITIS PAIN) 650 MG CR tablet, Take 650 mg by mouth every 8 (eight) hours as needed for pain., Disp: , Rfl:  .  aspirin 81 MG tablet, Take 81 mg by mouth daily., Disp: , Rfl:  .  cholecalciferol (VITAMIN D) 1000 units tablet, Take 1,000 Units by mouth daily., Disp: , Rfl:  .  citalopram (CELEXA) 20 MG tablet, Take 1 tablet (20 mg total) by mouth daily., Disp: 90 tablet, Rfl: 1 .  cloNIDine (CATAPRES) 0.1 MG tablet, Take 1 tablet (0.1 mg total) by mouth 2 (two) times daily., Disp: 60 tablet, Rfl: 5 .  irbesartan (AVAPRO) 300 MG tablet, Take 1 tablet (300 mg total) by mouth daily., Disp: 90 tablet, Rfl: 1 .  labetalol (NORMODYNE) 100 MG tablet, Take 1 tablet (100 mg total) by mouth 2 (two) times daily., Disp: 180 tablet, Rfl: 1 .  levothyroxine (SYNTHROID, LEVOTHROID) 75 MCG tablet, Take 1 tablet (75 mcg total) by mouth daily before breakfast., Disp: 30 tablet, Rfl: 6 .  meclizine (ANTIVERT) 12.5 MG tablet, Take 1 tablet (12.5 mg total) by mouth 3 (three) times daily as needed for dizziness., Disp: 15 tablet, Rfl: 0 .  nitroGLYCERIN (NITROSTAT) 0.4 MG SL tablet, Place 1 tablet (0.4 mg total) under the tongue every 5 (five) minutes as needed for chest pain., Disp: 20 tablet, Rfl: 12 .  Omega-3 Fatty Acids (FISH OIL PO), Take 1 capsule by mouth daily., Disp: , Rfl:  .  pantoprazole (PROTONIX) 40 MG tablet, Take 1 tablet (40 mg  total) by mouth daily., Disp: 90 tablet, Rfl: 1 .  ticagrelor (BRILINTA) 90 MG TABS tablet, Take 1 tablet (90 mg total) by mouth 2 (two) times daily., Disp: 60 tablet, Rfl: 6  Past Medical History: Past Medical History:  Diagnosis Date  . Arthrosis of knee   . Chronic kidney disease   . Hyperlipidemia   . Hypertension   . Loss of hearing   . Metabolic syndrome   . Plantar fasciitis     Tobacco Use: History  Smoking Status  . Former Smoker  Smokeless Tobacco  . 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 Participant demonstrates ability to understand and report RPE using BORG scale, to self-measure pulse accurately, and to acknowledge the importance of the exercise prescription.  Exercise Prescription Goal: Starting with aerobic activity 30 plus minutes a day, 3 days per week for initial exercise prescription. Provide home exercise prescription and guidelines that participant acknowledges understanding prior to discharge.  Activity Barriers & Risk Stratification:     Activity Barriers & Cardiac Risk Stratification - 12/06/16 1323      Activity Barriers & Cardiac Risk Stratification   Activity Barriers Arthritis;Deconditioning;Muscular Weakness;Balance Concerns  Arthritis is all over   Cardiac Risk Stratification High      6 Minute Walk:     6 Minute Walk    Row  Name 12/06/16 1454         6 Minute Walk   Phase Initial     Distance 2 feet     Walk Time 5.41 minutes     # of Rest Breaks 5  10 sec, 7 sec, 10 sec, 3 sec, 5 sec     MPH 1.26     METS 1.92     RPE 13     VO2 Peak 5.4     Symptoms Yes (comment)     Comments leg fatigue/pain 10/10 resolved with rest, gait unsteady     Resting HR 75 bpm     Resting BP 134/76     Max Ex. HR 97 bpm     Max Ex. BP 136/80     2 Minute Post BP 126/60        Oxygen Initial Assessment:   Oxygen Re-Evaluation:   Oxygen Discharge (Final Oxygen Re-Evaluation):   Initial Exercise Prescription:     Initial Exercise Prescription - 12/06/16 1400      Date of Initial Exercise RX and Referring Provider   Date 12/06/16   Referring Provider Lujean Amel MD     Treadmill   MPH 1.2   Grade 0   Minutes 15   METs 1.92     NuStep   Level 3   SPM 80   Minutes 15   METs 2     Biostep-RELP   Level 1   SPM 50   Minutes 15   METs 2     Prescription Details   Frequency (times per week) 2   Duration Progress to 45 minutes of aerobic exercise without signs/symptoms of physical distress     Intensity   THRR 40-80% of Max Heartrate 105-135   Ratings of Perceived Exertion 11-13   Perceived Dyspnea 0-4     Progression   Progression Continue to progress workloads to maintain intensity without signs/symptoms of physical distress.     Resistance Training   Training Prescription Yes   Weight 2 lbs   Reps 10-15      Perform Capillary Blood Glucose checks as needed.  Exercise Prescription Changes:     Exercise Prescription Changes    Row Name 12/06/16 1300             Response to Exercise   Blood Pressure (Admit) 134/76       Blood Pressure (Exercise) 136/80       Blood Pressure (Exit) 126/60       Heart Rate (Admit) 76 bpm       Heart Rate (Exercise) 97 bpm       Heart Rate (Exit) 61 bpm  Oxygen Saturation (Admit) 100 %       Oxygen Saturation (Exercise) 100 %        Rating of Perceived Exertion (Exercise) 13       Symptoms leg fatigue 10/10       Comments walk test results          Exercise Comments:     Exercise Comments    Row Name 12/09/16 1027           Exercise Comments First full day of exercise!  Patient was oriented to gym and equipment including functions, settings, policies, and procedures.  Patient's individual exercise prescription and treatment plan were reviewed.  All starting workloads were established based on the results of the 6 minute walk test done at initial orientation visit.  The plan for exercise progression was also introduced and progression will be customized based on patient's performance and goals.          Exercise Goals and Review:     Exercise Goals    Row Name 12/06/16 1501             Exercise Goals   Increase Physical Activity Yes       Intervention Provide advice, education, support and counseling about physical activity/exercise needs.;Develop an individualized exercise prescription for aerobic and resistive training based on initial evaluation findings, risk stratification, comorbidities and participant's personal goals.       Expected Outcomes Achievement of increased cardiorespiratory fitness and enhanced flexibility, muscular endurance and strength shown through measurements of functional capacity and personal statement of participant.       Increase Strength and Stamina Yes       Intervention Provide advice, education, support and counseling about physical activity/exercise needs.;Develop an individualized exercise prescription for aerobic and resistive training based on initial evaluation findings, risk stratification, comorbidities and participant's personal goals.       Expected Outcomes Achievement of increased cardiorespiratory fitness and enhanced flexibility, muscular endurance and strength shown through measurements of functional capacity and personal statement of participant.           Exercise Goals Re-Evaluation :   Discharge Exercise Prescription (Final Exercise Prescription Changes):     Exercise Prescription Changes - 12/06/16 1300      Response to Exercise   Blood Pressure (Admit) 134/76   Blood Pressure (Exercise) 136/80   Blood Pressure (Exit) 126/60   Heart Rate (Admit) 76 bpm   Heart Rate (Exercise) 97 bpm   Heart Rate (Exit) 61 bpm   Oxygen Saturation (Admit) 100 %   Oxygen Saturation (Exercise) 100 %   Rating of Perceived Exertion (Exercise) 13   Symptoms leg fatigue 10/10   Comments walk test results      Nutrition:  Target Goals: Understanding of nutrition guidelines, daily intake of sodium 1500mg , cholesterol 200mg , calories 30% from fat and 7% or less from saturated fats, daily to have 5 or more servings of fruits and vegetables.  Biometrics:     Pre Biometrics - 12/06/16 1501      Pre Biometrics   Height 5' 5.5" (1.664 m)   Weight 192 lb 11.2 oz (87.4 kg)   Waist Circumference 37 inches   Hip Circumference 43 inches   Waist to Hip Ratio 0.86 %   BMI (Calculated) 31.6   Single Leg Stand 4.93 seconds       Nutrition Therapy Plan and Nutrition Goals:     Nutrition Therapy & Goals - 12/06/16 1308  Intervention Plan   Intervention Prescribe, educate and counsel regarding individualized specific dietary modifications aiming towards targeted core components such as weight, hypertension, lipid management, diabetes, heart failure and other comorbidities.   Expected Outcomes Short Term Goal: Understand basic principles of dietary content, such as calories, fat, sodium, cholesterol and nutrients.;Short Term Goal: A plan has been developed with personal nutrition goals set during dietitian appointment.;Long Term Goal: Adherence to prescribed nutrition plan.      Nutrition Discharge: Rate Your Plate Scores:     Nutrition Assessments - 12/06/16 1308      MEDFICTS Scores   Pre Score --  Betty Nielsen did not want to complete the  questionnaire. "I only eat little bits"       Nutrition Goals Re-Evaluation:   Nutrition Goals Discharge (Final Nutrition Goals Re-Evaluation):   Psychosocial: Target Goals: Acknowledge presence or absence of significant depression and/or stress, maximize coping skills, provide positive support system. Participant is able to verbalize types and ability to use techniques and skills needed for reducing stress and depression.   Initial Review & Psychosocial Screening:     Initial Psych Review & Screening - 12/06/16 1310      Initial Review   Current issues with None Identified     Family Dynamics   Good Support System? Yes  Son/his wife, adult grand children   Comments Has been widowed for 10 years     Barriers   Psychosocial barriers to participate in program There are no identifiable barriers or psychosocial needs.;The patient should benefit from training in stress management and relaxation.     Screening Interventions   Interventions Encouraged to exercise      Quality of Life Scores:      Quality of Life - 12/06/16 1312      Quality of Life Scores   Health/Function Pre 20.93 %   Socioeconomic Pre 20.83 %   Psych/Spiritual Pre 21 %   Family Pre 21 %   GLOBAL Pre 20.94 %      PHQ-9: Recent Review Flowsheet Data    Depression screen [facility] 2/9 12/06/2016 06/15/2016 11/21/2015 10/08/2015 06/05/2015   Decreased Interest 0 0 0 0 0   Down, Depressed, Hopeless 0 0 0 0 0   PHQ - 2 Score 0 0 0 0 0   Altered sleeping 0 - - - -   Tired, decreased energy 1 - - - -   Change in appetite 0 - - - -   Feeling bad or failure about yourself  0 - - - -   Trouble concentrating 0 - - - -   Moving slowly or fidgety/restless 0 - - - -   Suicidal thoughts 0 - - - -   PHQ-9 Score 1 - - - -   Difficult doing work/chores Not difficult at all - - - -     Interpretation of Total Score  Total Score Depression Severity:  1-4 = Minimal depression, 5-9 = Mild depression, 10-14 = Moderate  depression, 15-19 = Moderately severe depression, 20-27 = Severe depression   Psychosocial Evaluation and Intervention:   Psychosocial Re-Evaluation:   Psychosocial Discharge (Final Psychosocial Re-Evaluation):   Vocational Rehabilitation: Provide vocational rehab assistance to qualifying candidates.   Vocational Rehab Evaluation & Intervention:     Vocational Rehab - 12/06/16 1330      Initial Vocational Rehab Evaluation & Intervention   Assessment shows need for Vocational Rehabilitation No      Education: Education Goals: Education classes will be provided  on a weekly basis, covering required topics. Participant will state understanding/return demonstration of topics presented.  Learning Barriers/Preferences:     Learning Barriers/Preferences - 12/06/16 1324      Learning Barriers/Preferences   Learning Barriers Hearing  Wears heaering aids   Learning Preferences Individual Instruction      Education Topics: General Nutrition Guidelines/Fats and Fiber: -Group instruction provided by verbal, written material, models and posters to present the general guidelines for heart healthy nutrition. Gives an explanation and review of dietary fats and fiber.   Controlling Sodium/Reading Food Labels: -Group verbal and written material supporting the discussion of sodium use in heart healthy nutrition. Review and explanation with models, verbal and written materials for utilization of the food label.   Exercise Physiology & Risk Factors: - Group verbal and written instruction with models to review the exercise physiology of the cardiovascular system and associated critical values. Details cardiovascular disease risk factors and the goals associated with each risk factor.   Aerobic Exercise & Resistance Training: - Gives group verbal and written discussion on the health impact of inactivity. On the components of aerobic and resistive training programs and the benefits of this  training and how to safely progress through these programs.   Cardiac Rehab from 12/14/2016 in [facility] Cardiac and Pulmonary Rehab  Date  12/09/16  Educator  AS  Instruction Review Code  2- meets goals/outcomes      Flexibility, Balance, General Exercise Guidelines: - Provides group verbal and written instruction on the benefits of flexibility and balance training programs. Provides general exercise guidelines with specific guidelines to those with heart or lung disease. Demonstration and skill practice provided.   Cardiac Rehab from 12/14/2016 in [facility] Cardiac and Pulmonary Rehab  Date  12/14/16  Educator  [facility]  Instruction Review Code  2- meets goals/outcomes      Stress Management: - Provides group verbal and written instruction about the health risks of elevated stress, cause of high stress, and healthy ways to reduce stress.   Depression: - Provides group verbal and written instruction on the correlation between heart/lung disease and depressed mood, treatment options, and the stigmas associated with seeking treatment.   Anatomy & Physiology of the Heart: - Group verbal and written instruction and models provide basic cardiac anatomy and physiology, with the coronary electrical and arterial systems. Review of: AMI, Angina, Valve disease, Heart Failure, Cardiac Arrhythmia, Pacemakers, and the ICD.   Cardiac Procedures: - Group verbal and written instruction and models to describe the testing methods done to diagnose heart disease. Reviews the outcomes of the test results. Describes the treatment choices: Medical Management, Angioplasty, or Coronary Bypass Surgery.   Cardiac Medications: - Group verbal and written instruction to review commonly prescribed medications for heart disease. Reviews the medication, class of the drug, and side effects. Includes the steps to properly store meds and maintain the prescription regimen.   Go Sex-Intimacy & Heart Disease, Get SMART - Goal  Setting: - Group verbal and written instruction through game format to discuss heart disease and the return to sexual intimacy. Provides group verbal and written material to discuss and apply goal setting through the application of the S.M.A.R.T. Method.   Other Matters of the Heart: - Provides group verbal, written materials and models to describe Heart Failure, Angina, Valve Disease, and Diabetes in the realm of heart disease. Includes description of the disease process and treatment options available to the cardiac patient.   Exercise & Equipment Safety: - Individual verbal instruction and  demonstration of equipment use and safety with use of the equipment.   Cardiac Rehab from 12/14/2016 in [facility] Cardiac and Pulmonary Rehab  Date  12/06/16  Educator  Sb  Instruction Review Code  2- meets goals/outcomes      Infection Prevention: - Provides verbal and written material to individual with discussion of infection control including proper hand washing and proper equipment cleaning during exercise session.   Cardiac Rehab from 12/14/2016 in [facility] Cardiac and Pulmonary Rehab  Date  12/06/16  Educator  Sb  Instruction Review Code  2- meets goals/outcomes      Falls Prevention: - Provides verbal and written material to individual with discussion of falls prevention and safety.   Cardiac Rehab from 12/14/2016 in [facility] Cardiac and Pulmonary Rehab  Date  12/06/16  Educator  SB  Instruction Review Code  2- meets goals/outcomes      Diabetes: - Individual verbal and written instruction to review signs/symptoms of diabetes, desired ranges of glucose level fasting, after meals and with exercise. Advice that pre and post exercise glucose checks will be done for 3 sessions at entry of program.    Knowledge Questionnaire Score:     Knowledge Questionnaire Score - 12/06/16 1330      Knowledge Questionnaire Score   Pre Score 15/28  Reviewed correct responses with Betty Nielsen today. She vebalized  understanding of the responses      Core Components/Risk Factors/Patient Goals at Admission:     Personal Goals and Risk Factors at Admission - 12/06/16 1309      Core Components/Risk Factors/Patient Goals on Admission    Weight Management Obesity;Yes;Weight Loss   Intervention Weight Management: Develop a combined nutrition and exercise program designed to reach desired caloric intake, while maintaining appropriate intake of nutrient and fiber, sodium and fats, and appropriate energy expenditure required for the weight goal.;Weight Management: Provide education and appropriate resources to help participant work on and attain dietary goals.   Admit Weight 198 lb (89.8 kg)   Goal Weight: Short Term 195 lb (88.5 kg)   Goal Weight: Long Term 180 lb (81.6 kg)   Expected Outcomes Short Term: Continue to assess and modify interventions until short term weight is achieved;Long Term: Adherence to nutrition and physical activity/exercise program aimed toward attainment of established weight goal;Weight Loss: Understanding of general recommendations for a balanced deficit meal plan, which promotes 1-2 lb weight loss per week and includes a negative energy balance of [phone] kcal/d;Understanding recommendations for meals to include 15-35% energy as protein, 25-35% energy from fat, 35-60% energy from carbohydrates, less than 200mg  of dietary cholesterol, 20-35 gm of total fiber daily;Understanding of distribution of calorie intake throughout the day with the consumption of 4-5 meals/snacks   Hypertension Yes   Intervention Provide education on lifestyle modifcations including regular physical activity/exercise, weight management, moderate sodium restriction and increased consumption of fresh fruit, vegetables, and low fat dairy, alcohol moderation, and smoking cessation.;Monitor prescription use compliance.   Expected Outcomes Short Term: Continued assessment and intervention until BP is < 140/14mm HG in  hypertensive participants. < 130/95mm HG in hypertensive participants with diabetes, heart failure or chronic kidney disease.;Long Term: Maintenance of blood pressure at goal levels.   Lipids Yes   Intervention Provide education and support for participant on nutrition & aerobic/resistive exercise along with prescribed medications to achieve LDL 70mg , HDL >40mg .   Expected Outcomes Short Term: Participant states understanding of desired cholesterol values and is compliant with medications prescribed. Participant is following exercise prescription and  nutrition guidelines.;Long Term: Cholesterol controlled with medications as prescribed, with individualized exercise RX and with personalized nutrition plan. Value goals: LDL < 70mg , HDL > 40 mg.      Core Components/Risk Factors/Patient Goals Review:    Core Components/Risk Factors/Patient Goals at Discharge (Final Review):    ITP Comments:     ITP Comments    Row Name 12/06/16 1301 12/06/16 1333 12/15/16 0558       ITP Comments Meical review completed with initial ITP created. Diagnosis documentation can be found in [facility] Encounter 10/25/2016 Medical review completed with initial ITP created. Diagnosis documentation can be found in [facility] Encounter 10/25/2016 30 day review. Continue with ITP unless directed changes per Medical Director review  New to program        Comments:

## 2016-12-16 DIAGNOSIS — Z48812 Encounter for surgical aftercare following surgery on the circulatory system: Secondary | ICD-10-CM | POA: Diagnosis not present

## 2016-12-16 DIAGNOSIS — Z955 Presence of coronary angioplasty implant and graft: Secondary | ICD-10-CM

## 2016-12-16 NOTE — Progress Notes (Signed)
Daily Session Note  Patient Details  Name: Betty Nielsen MRN: [id number] Date of Birth: 31-Oct-1945 Referring Provider:     Cardiac Rehab from 12/06/2016 in [facility] Cardiac and Pulmonary Rehab  Referring Provider  Lujean Amel MD      Encounter Date: 12/16/2016  Check In:     Session Check In - 12/16/16 0912      Check-In   Location [facility]-Cardiac & Pulmonary Rehab   Staff Present Alberteen Sam, MA, ACSM RCEP, Exercise Physiologist;Mary Kellie Shropshire, RN, BSN, Kela Millin, BA, ACSM CEP, Exercise Physiologist   Supervising physician immediately available to respond to emergencies See telemetry face sheet for immediately available ER MD   Medication changes reported     No   Fall or balance concerns reported    No   Warm-up and Cool-down Performed on first and last piece of equipment   Resistance Training Performed Yes   VAD Patient? No     Pain Assessment   Currently in Pain? No/denies         History  Smoking Status  . Former Smoker  Smokeless Tobacco  . Never Used    Goals Met:  Independence with exercise equipment Exercise tolerated well No report of cardiac concerns or symptoms Strength training completed today  Goals Unmet:  Not Applicable  Comments: Pt able to follow exercise prescription today without complaint.  Will continue to monitor for progression.    Dr. Emily Filbert is Medical Director for [place] and [facility].

## 2016-12-21 ENCOUNTER — Encounter: Payer: Medicare Other | Attending: Internal Medicine | Admitting: *Deleted

## 2016-12-21 DIAGNOSIS — Z48812 Encounter for surgical aftercare following surgery on the circulatory system: Secondary | ICD-10-CM | POA: Insufficient documentation

## 2016-12-21 DIAGNOSIS — Z955 Presence of coronary angioplasty implant and graft: Secondary | ICD-10-CM | POA: Diagnosis not present

## 2016-12-21 NOTE — Progress Notes (Signed)
Daily Session Note  Patient Details  Name: Betty Nielsen MRN: [id number] Date of Birth: 1946-06-01 Referring Provider:     Cardiac Rehab from 12/06/2016 in [facility] Cardiac and Pulmonary Rehab  Referring Provider  Lujean Amel MD      Encounter Date: 12/21/2016  Check In:     Session Check In - 12/21/16 0903      Check-In   Location [facility]-Cardiac & Pulmonary Rehab   Staff Present Alberteen Sam, MA, ACSM RCEP, Exercise Physiologist;Susanne Bice, RN, BSN, CCRP;Laureen Owens Shark, BS, RRT, Respiratory Therapist   Supervising physician immediately available to respond to emergencies See telemetry face sheet for immediately available ER MD   Medication changes reported     No   Fall or balance concerns reported    No   Warm-up and Cool-down Performed on first and last piece of equipment   Resistance Training Performed Yes   VAD Patient? No     Pain Assessment   Currently in Pain? No/denies   Multiple Pain Sites No         History  Smoking Status  . Former Smoker  Smokeless Tobacco  . Never Used    Goals Met:  Independence with exercise equipment Exercise tolerated well No report of cardiac concerns or symptoms Strength training completed today  Goals Unmet:  Not Applicable  Comments: Pt able to follow exercise prescription today without complaint.  Will continue to monitor for progression. Reviewed home exercise with pt today.  Pt plans to continue to go to [facility] for exercise.  Reviewed THR, pulse, RPE, sign and symptoms, and when to call 911 or MD.  Also discussed weather considerations and indoor options.  Pt voiced understanding.    Dr. Emily Filbert is Medical Director for [place] and [facility].

## 2016-12-23 DIAGNOSIS — Z955 Presence of coronary angioplasty implant and graft: Secondary | ICD-10-CM

## 2016-12-23 DIAGNOSIS — Z48812 Encounter for surgical aftercare following surgery on the circulatory system: Secondary | ICD-10-CM | POA: Diagnosis not present

## 2016-12-23 NOTE — Progress Notes (Signed)
Daily Session Note  Patient Details  Name: Betty Nielsen MRN: [id number] Date of Birth: [birth year] Referring Provider:     Cardiac Rehab from 12/06/2016 in [facility] Cardiac and Pulmonary Rehab  Referring Provider  Lujean Amel MD      Encounter Date: 12/23/2016  Check In:     Session Check In - 12/23/16 0837      Check-In   Location [facility]-Cardiac & Pulmonary Rehab   Staff Present Alberteen Sam, MA, ACSM RCEP, Exercise Physiologist;Susanne Bice, RN, BSN, Lance Sell, BA, ACSM CEP, Exercise Physiologist   Supervising physician immediately available to respond to emergencies See telemetry face sheet for immediately available ER MD   Medication changes reported     No   Fall or balance concerns reported    No   Warm-up and Cool-down Performed on first and last piece of equipment   Resistance Training Performed Yes   VAD Patient? No         History  Smoking Status  . Former Smoker  Smokeless Tobacco  . Never Used    Goals Met:  Proper associated with RPD/PD & O2 Sat Independence with exercise equipment Exercise tolerated well Strength training completed today  Goals Unmet:  Not Applicable  Comments: Pt able to follow exercise prescription today without complaint.  Will continue to monitor for progression.    Dr. Emily Filbert is Medical Director for [place] and [facility].

## 2016-12-28 ENCOUNTER — Encounter: Payer: Medicare Other | Admitting: Respiratory Therapy

## 2016-12-28 DIAGNOSIS — Z955 Presence of coronary angioplasty implant and graft: Secondary | ICD-10-CM

## 2016-12-28 DIAGNOSIS — Z48812 Encounter for surgical aftercare following surgery on the circulatory system: Secondary | ICD-10-CM | POA: Diagnosis not present

## 2016-12-28 NOTE — Progress Notes (Signed)
Daily Session Note  Patient Details  Name: Betty Nielsen MRN: [id number] Date of Birth: 1946/06/13 Referring Provider:     Cardiac Rehab from 12/06/2016 in [facility] Cardiac and Pulmonary Rehab  Referring Provider  Lujean Amel MD      Encounter Date: 12/28/2016  Check In:     Session Check In - 12/28/16 0830      Check-In   Location [facility]-Cardiac & Pulmonary Rehab   Staff Present Alberteen Sam, MA, ACSM RCEP, Exercise Physiologist;Susanne Bice, RN, BSN, CCRP;Laureen Owens Shark, BS, RRT, Respiratory Therapist   Supervising physician immediately available to respond to emergencies See telemetry face sheet for immediately available ER MD   Medication changes reported     No   Fall or balance concerns reported    No   Warm-up and Cool-down Performed on first and last piece of equipment   Resistance Training Performed Yes   VAD Patient? No     Pain Assessment   Currently in Pain? No/denies         History  Smoking Status   Former Smoker  Smokeless Tobacco   Never Used    Goals Met:  Proper associated with RPD/PD & O2 Sat Independence with exercise equipment Exercise tolerated well No report of cardiac concerns or symptoms Strength training completed today  Goals Unmet:  Not Applicable  Comments: Pt able to follow exercise prescription today without complaint.  Will continue to monitor for progression.   Dr. Emily Filbert is Medical Director for [place] and [facility].

## 2016-12-30 ENCOUNTER — Telehealth: Payer: Self-pay | Admitting: *Deleted

## 2016-12-30 NOTE — Telephone Encounter (Signed)
Mrs. Betty Nielsen called out today saying that she did not sleep well last night and will be out today.

## 2017-01-04 ENCOUNTER — Encounter: Payer: Medicare Other | Admitting: Respiratory Therapy

## 2017-01-04 DIAGNOSIS — Z955 Presence of coronary angioplasty implant and graft: Secondary | ICD-10-CM

## 2017-01-04 DIAGNOSIS — Z48812 Encounter for surgical aftercare following surgery on the circulatory system: Secondary | ICD-10-CM | POA: Diagnosis not present

## 2017-01-04 NOTE — Progress Notes (Signed)
Daily Session Note  Patient Details  Name: Betty Nielsen MRN: [id number] Date of Birth: 1946-02-01 Referring Provider:     Cardiac Rehab from 12/06/2016 in [facility] Cardiac and Pulmonary Rehab  Referring Provider  Lujean Amel MD      Encounter Date: 01/04/2017  Check In:     Session Check In - 01/04/17 0827      Check-In   Location [facility]-Cardiac & Pulmonary Rehab   Staff Present Alberteen Sam, MA, ACSM RCEP, Exercise Physiologist;Susanne Bice, RN, BSN, CCRP;Laureen Owens Shark, BS, RRT, Respiratory Therapist   Supervising physician immediately available to respond to emergencies See telemetry face sheet for immediately available ER MD   Medication changes reported     No   Fall or balance concerns reported    No   Warm-up and Cool-down Performed on first and last piece of equipment   Resistance Training Performed Yes   VAD Patient? No     Pain Assessment   Currently in Pain? No/denies   Multiple Pain Sites No         History  Smoking Status   Former Smoker  Smokeless Tobacco   Never Used    Goals Met:  Proper associated with RPD/PD & O2 Sat Independence with exercise equipment Exercise tolerated well No report of cardiac concerns or symptoms Strength training completed today  Goals Unmet:  Not Applicable  Comments: Pt able to follow exercise prescription today without complaint.  Will continue to monitor for progression.  Dr. Emily Filbert is Medical Director for [place] and [facility].

## 2017-01-06 DIAGNOSIS — Z48812 Encounter for surgical aftercare following surgery on the circulatory system: Secondary | ICD-10-CM | POA: Diagnosis not present

## 2017-01-06 DIAGNOSIS — Z955 Presence of coronary angioplasty implant and graft: Secondary | ICD-10-CM

## 2017-01-06 LAB — GLUCOSE, CAPILLARY
GLUCOSE-CAPILLARY: 77 mg/dL (ref 65–99)
GLUCOSE-CAPILLARY: 96 mg/dL (ref 65–99)

## 2017-01-06 NOTE — Progress Notes (Signed)
Incomplete Session Note  Patient Details  Name: Betty Nielsen MRN: [id number] Date of Birth: [birth year] Referring Provider:     Cardiac Rehab from 12/06/2016 in [facility] Cardiac and Pulmonary Rehab  Referring Provider  Lujean Amel MD      Julio Sicks did not complete her rehab session.  During education Timmothy Sours got up and walked to the entrance to the gym to sit down holding her head.  She said that she was feeling very dizzy and had had multiple rounds of loose stool today.  He blood pressure had dropped 50 mmHg since checking into rehab and was 116/66 versus the 160 that she checked in at. We also checked her blood sugar since she was still dizzy and feeling off.  She was only 77 mg/dl.  She was given a granola bar as a snack.  After her snack, her dizziness has subsided and her blood sugar was back up to 96 mg/dl.  She was able to go home and discharge BP was 116/64. We enouraged her to let her doctor know if she continues to have symptoms.

## 2017-01-11 ENCOUNTER — Encounter: Payer: Medicare Other | Admitting: Respiratory Therapy

## 2017-01-12 ENCOUNTER — Encounter: Payer: Self-pay | Admitting: *Deleted

## 2017-01-12 DIAGNOSIS — Z955 Presence of coronary angioplasty implant and graft: Secondary | ICD-10-CM

## 2017-01-12 NOTE — Progress Notes (Signed)
Cardiac Individual Treatment Plan  Patient Details  Name: Betty Nielsen MRN: [id number] Date of Birth: 02-13-1946 Referring Provider:     Cardiac Rehab from 12/06/2016 in [facility] Cardiac and Pulmonary Rehab  Referring Provider  Lujean Amel MD      Initial Encounter Date:    Cardiac Rehab from 12/06/2016 in [facility] Cardiac and Pulmonary Rehab  Date  12/06/16  Referring Provider  Lujean Amel MD      Visit Diagnosis: Status post coronary artery stent placement  Patient's Home Medications on Admission:  Current Outpatient Prescriptions:  .  acetaminophen (TYLENOL ARTHRITIS PAIN) 650 MG CR tablet, Take 650 mg by mouth every 8 (eight) hours as needed for pain., Disp: , Rfl:  .  aspirin 81 MG tablet, Take 81 mg by mouth daily., Disp: , Rfl:  .  cholecalciferol (VITAMIN D) 1000 units tablet, Take 1,000 Units by mouth daily., Disp: , Rfl:  .  citalopram (CELEXA) 20 MG tablet, Take 1 tablet (20 mg total) by mouth daily., Disp: 90 tablet, Rfl: 1 .  cloNIDine (CATAPRES) 0.1 MG tablet, Take 1 tablet (0.1 mg total) by mouth 2 (two) times daily., Disp: 60 tablet, Rfl: 5 .  irbesartan (AVAPRO) 300 MG tablet, Take 1 tablet (300 mg total) by mouth daily., Disp: 90 tablet, Rfl: 1 .  labetalol (NORMODYNE) 100 MG tablet, Take 1 tablet (100 mg total) by mouth 2 (two) times daily., Disp: 180 tablet, Rfl: 1 .  levothyroxine (SYNTHROID, LEVOTHROID) 75 MCG tablet, Take 1 tablet (75 mcg total) by mouth daily before breakfast., Disp: 30 tablet, Rfl: 6 .  meclizine (ANTIVERT) 12.5 MG tablet, Take 1 tablet (12.5 mg total) by mouth 3 (three) times daily as needed for dizziness., Disp: 15 tablet, Rfl: 0 .  nitroGLYCERIN (NITROSTAT) 0.4 MG SL tablet, Place 1 tablet (0.4 mg total) under the tongue every 5 (five) minutes as needed for chest pain., Disp: 20 tablet, Rfl: 12 .  Omega-3 Fatty Acids (FISH OIL PO), Take 1 capsule by mouth daily., Disp: , Rfl:  .  pantoprazole (PROTONIX) 40 MG tablet, Take 1 tablet (40 mg  total) by mouth daily., Disp: 90 tablet, Rfl: 1 .  ticagrelor (BRILINTA) 90 MG TABS tablet, Take 1 tablet (90 mg total) by mouth 2 (two) times daily., Disp: 60 tablet, Rfl: 6  Past Medical History: Past Medical History:  Diagnosis Date  . Arthrosis of knee   . Chronic kidney disease   . Hyperlipidemia   . Hypertension   . Loss of hearing   . Metabolic syndrome   . Plantar fasciitis     Tobacco Use: History  Smoking Status  . Former Smoker  Smokeless Tobacco  . 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 Participant demonstrates ability to understand and report RPE using BORG scale, to self-measure pulse accurately, and to acknowledge the importance of the exercise prescription.  Exercise Prescription Goal: Starting with aerobic activity 30 plus minutes a day, 3 days per week for initial exercise prescription. Provide home exercise prescription and guidelines that participant acknowledges understanding prior to discharge.  Activity Barriers & Risk Stratification:     Activity Barriers & Cardiac Risk Stratification - 12/06/16 1323      Activity Barriers & Cardiac Risk Stratification   Activity Barriers Arthritis;Deconditioning;Muscular Weakness;Balance Concerns  Arthritis is all over   Cardiac Risk Stratification High      6 Minute Walk:     6 Minute Walk    Row  Name 12/06/16 1454         6 Minute Walk   Phase Initial     Distance 2 feet     Walk Time 5.41 minutes     # of Rest Breaks 5  10 sec, 7 sec, 10 sec, 3 sec, 5 sec     MPH 1.26     METS 1.92     RPE 13     VO2 Peak 5.4     Symptoms Yes (comment)     Comments leg fatigue/pain 10/10 resolved with rest, gait unsteady     Resting HR 75 bpm     Resting BP 134/76     Max Ex. HR 97 bpm     Max Ex. BP 136/80     2 Minute Post BP 126/60        Oxygen Initial Assessment:   Oxygen Re-Evaluation:   Oxygen Discharge (Final Oxygen Re-Evaluation):   Initial Exercise Prescription:     Initial Exercise Prescription - 12/06/16 1400      Date of Initial Exercise RX and Referring Provider   Date 12/06/16   Referring Provider Lujean Amel MD     Treadmill   MPH 1.2   Grade 0   Minutes 15   METs 1.92     NuStep   Level 3   SPM 80   Minutes 15   METs 2     Biostep-RELP   Level 1   SPM 50   Minutes 15   METs 2     Prescription Details   Frequency (times per week) 2   Duration Progress to 45 minutes of aerobic exercise without signs/symptoms of physical distress     Intensity   THRR 40-80% of Max Heartrate 105-135   Ratings of Perceived Exertion 11-13   Perceived Dyspnea 0-4     Progression   Progression Continue to progress workloads to maintain intensity without signs/symptoms of physical distress.     Resistance Training   Training Prescription Yes   Weight 2 lbs   Reps 10-15      Perform Capillary Blood Glucose checks as needed.  Exercise Prescription Changes:     Exercise Prescription Changes    Row Name 12/06/16 1300 12/21/16 0900 12/23/16 1400 01/07/17 1600       Response to Exercise   Blood Pressure (Admit) 134/76  - 134/72 112/68    Blood Pressure (Exercise) 136/80  - 118/74 122/70    Blood Pressure (Exit) 126/60  - 124/64 130/80    Heart Rate (Admit) 76 bpm  - 64 bpm 63 bpm    Heart Rate (Exercise) 97 bpm  - 89 bpm 88 bpm  Heart  Rate (Exit) 61 bpm  - 64 bpm 67 bpm    Oxygen Saturation (Admit) 100 %  -  -  -    Oxygen Saturation (Exercise) 100 %  -  -  -    Rating of Perceived Exertion (Exercise) 13  - 13 13    Symptoms leg fatigue 10/10  - fatigue fatigue    Comments walk test results  -  -  -    Duration  -  - Progress to 45 minutes of aerobic exercise without signs/symptoms of physical distress Progress to 45 minutes of aerobic exercise without signs/symptoms of physical distress    Intensity  -  - THRR unchanged THRR unchanged      Progression   Progression  -  - Continue to progress workloads to maintain intensity without signs/symptoms of physical distress. Continue to progress workloads to maintain intensity without signs/symptoms of physical distress.    Average METs  -  - 1.77 1.81      Resistance Training   Training Prescription  - Yes Yes Yes    Weight  - 2 lbs 2 lbs 2 lbs    Reps  - 10-15 10-15 10-15      Interval Training   Interval Training  -  - No No      Treadmill   MPH  - 1.2 1.2 1.2    Grade  - 0 0 0    Minutes  - '15 15 15    '$ METs  - 1.92 1.92 1.92      NuStep   Level  - '3 1 1    '$ SPM  - 80 80 80    Minutes  - '15 15 15    '$ METs  - 2 1.4 1.5      Biostep-RELP   Level  - '1 2 2    '$ SPM  - 50 50 50    Minutes  - '15 15 15    '$ METs  - '2 2 2      '$ Home Exercise Plan   Plans to continue exercise at  - [employer] (comment)  [place] (comment)  [place] (comment)  [employer]    Frequency  - Add 3 additional days to program exercise sessions. Add 3 additional days to program exercise sessions. Add 3 additional days to program exercise sessions.    Initial Home Exercises Provided  - 12/21/16 12/21/16 12/21/16       Exercise Comments:     Exercise Comments    Row Name 12/09/16 1027 01/07/17 1603         Exercise Comments First full day of exercise!  Patient was oriented to gym and equipment including functions, settings, policies, and procedures.   Patient's individual exercise prescription and treatment plan were reviewed.  All starting workloads were established based on the results of the 6 minute walk test done at initial orientation visit.  The plan for exercise progression was also introduced and progression will be customized based on patient's performance and goals. Julio Sicks did not complete her rehab session.  During education Timmothy Sours got up and walked to the entrance to the gym to sit down holding her head.  She said that she was feeling very dizzy and had had multiple rounds of loose stool today.  He blood pressure had dropped 50 mmHg since checking into rehab and was 116/66 versus the 160 that she checked in at. We also checked her blood sugar  since she was still dizzy and feeling off.  She was only 77 mg/dl.  She was given a granola bar as a snack.  After her snack, her dizziness has subsided and her blood sugar was back up to 96 mg/dl.  She was able to go home and discharge BP was 116/64. We enouraged her to let her doctor know if she continues to have symptoms.         Exercise Goals and Review:     Exercise Goals    Row Name 12/06/16 1501             Exercise Goals   Increase Physical Activity Yes       Intervention Provide advice, education, support and counseling about physical activity/exercise needs.;Develop an individualized exercise prescription for aerobic and resistive training based on initial evaluation findings, risk stratification, comorbidities and participant's personal goals.       Expected Outcomes Achievement of increased cardiorespiratory fitness and enhanced flexibility, muscular endurance and strength shown through measurements of functional capacity and personal statement of participant.       Increase Strength and Stamina Yes       Intervention Provide advice, education, support and counseling about physical activity/exercise needs.;Develop an individualized exercise prescription for aerobic and  resistive training based on initial evaluation findings, risk stratification, comorbidities and participant's personal goals.       Expected Outcomes Achievement of increased cardiorespiratory fitness and enhanced flexibility, muscular endurance and strength shown through measurements of functional capacity and personal statement of participant.          Exercise Goals Re-Evaluation :     Exercise Goals Re-Evaluation    Row Name 12/21/16 4401 12/23/16 1443 01/07/17 1602         Exercise Goal Re-Evaluation   Exercise Goals Review Increase Physical Activity;Increase Strenth and Stamina Increase Physical Activity;Increase Strenth and Stamina Increase Physical Activity;Increase Strenth and Stamina     Comments Reviewed home exercise with pt today.  Pt plans to continue to go to [facility] for exercise.  Reviewed THR, pulse, RPE, sign and symptoms, and when to call 911 or MD.  Also discussed weather considerations and indoor options.  Pt voiced understanding. Timmothy Sours has been doing well in rehab and is off to a good start.  She is already going to [facility] for exercise on her off days with [place].  She is ready to start working on her workloads.  We will continue to monitor her progression. Timmothy Sours continues to do well in rehab. She is slow to make progress and we will start to pick that up more as she is getting stronger.  Currently we are still trying to get her to maintain the minimum speeds.  We will contiinue to monitor her progression.     Expected Outcomes Short and Long: Continue to go to [facility] for exercise. Short: Begin to move up some of Don's workloads.  Long: Continue to come to class to work on strength and stamina. Short: Move up workloads.  Long: Continue to work on IT sales professional.        Discharge Exercise Prescription (Final Exercise Prescription Changes):     Exercise Prescription Changes - 01/07/17 1600      Response to Exercise   Blood Pressure (Admit) 112/68   Blood Pressure (Exercise)  122/70   Blood Pressure (Exit) 130/80   Heart Rate (Admit) 63 bpm   Heart Rate (Exercise) 88 bpm   Heart Rate (Exit) 67 bpm   Rating  of Perceived Exertion (Exercise) 13   Symptoms fatigue   Duration Progress to 45 minutes of aerobic exercise without signs/symptoms of physical distress   Intensity THRR unchanged     Progression   Progression Continue to progress workloads to maintain intensity without signs/symptoms of physical distress.   Average METs 1.81     Resistance Training   Training Prescription Yes   Weight 2 lbs   Reps 10-15     Interval Training   Interval Training No     Treadmill   MPH 1.2   Grade 0   Minutes 15   METs 1.92     NuStep   Level 1   SPM 80   Minutes 15   METs 1.5     Biostep-RELP   Level 2   SPM 50   Minutes 15   METs 2     Home Exercise Plan   Plans to continue exercise at [employer] [facility] [employer] (comment)  [employer]   Frequency Add 3 additional days to program exercise sessions.   Initial Home Exercises Provided 12/21/16      Nutrition:  Target Goals: Understanding of nutrition guidelines, daily intake of sodium '1500mg'$ , cholesterol '200mg'$ , calories 30% from fat and 7% or less from saturated fats, daily to have 5 or more servings of fruits and vegetables.  Biometrics:     Pre Biometrics - 12/06/16 1501      Pre Biometrics   Height 5' 5.5" (1.664 m)   Weight 192 lb 11.2 oz (87.4 kg)   Waist Circumference 37 inches   Hip Circumference 43 inches   Waist to Hip Ratio 0.86 %   BMI (Calculated) 31.6   Single Leg Stand 4.93 seconds       Nutrition Therapy Plan and Nutrition Goals:     Nutrition Therapy & Goals - 12/06/16 1308      Intervention Plan   Intervention Prescribe, educate and counsel regarding individualized specific dietary modifications aiming towards targeted core components such as weight, hypertension, lipid management, diabetes, heart failure and other comorbidities.   Expected Outcomes Short Term Goal: Understand  basic principles of dietary content, such as calories, fat, sodium, cholesterol and nutrients.;Short Term Goal: A plan has been developed with personal nutrition goals set during dietitian appointment.;Long Term Goal: Adherence to prescribed nutrition plan.      Nutrition Discharge: Rate Your Plate Scores:     Nutrition Assessments - 12/06/16 1308      MEDFICTS Scores   Pre Score --  Timmothy Sours did not want to complete the questionnaire. "I only eat little bits"       Nutrition Goals Re-Evaluation:   Nutrition Goals Discharge (Final Nutrition Goals Re-Evaluation):   Psychosocial: Target Goals: Acknowledge presence or absence of significant depression and/or stress, maximize coping skills, provide positive support system. Participant is able to verbalize types and ability to use techniques and skills needed for reducing stress and depression.   Initial Review & Psychosocial Screening:     Initial Psych Review & Screening - 12/06/16 1310      Initial Review   Current issues with None Identified     Family Dynamics   Good Support System? Yes  Son/his wife, adult grand children   Comments Has been widowed for 10 years     Barriers   Psychosocial barriers to participate in program There are no identifiable barriers or psychosocial needs.;The patient should benefit from training in stress management and relaxation.     Screening Interventions   Interventions  Encouraged to exercise      Quality of Life Scores:      Quality of Life - 12/06/16 1312      Quality of Life Scores   Health/Function Pre 20.93 %   Socioeconomic Pre 20.83 %   Psych/Spiritual Pre 21 %   Family Pre 21 %   GLOBAL Pre 20.94 %      PHQ-9: Recent Review Flowsheet Data    Depression screen [facility] 2/9 12/06/2016 06/15/2016 11/21/2015 10/08/2015 06/05/2015   Decreased Interest 0 0 0 0 0   Down, Depressed, Hopeless 0 0 0 0 0   PHQ - 2 Score 0 0 0 0 0   Altered sleeping 0 - - - -   Tired, decreased energy 1 - -  - -   Change in appetite 0 - - - -   Feeling bad or failure about yourself  0 - - - -   Trouble concentrating 0 - - - -   Moving slowly or fidgety/restless 0 - - - -   Suicidal thoughts 0 - - - -   PHQ-9 Score 1 - - - -   Difficult doing work/chores Not difficult at all - - - -     Interpretation of Total Score  Total Score Depression Severity:  1-4 = Minimal depression, 5-9 = Mild depression, 10-14 = Moderate depression, 15-19 = Moderately severe depression, 20-27 = Severe depression   Psychosocial Evaluation and Intervention:     Psychosocial Evaluation - 12/16/16 0929      Psychosocial Evaluation & Interventions   Interventions Encouraged to exercise with the program and follow exercise prescription   Comments Counselor met with Ms. Duanne Moron) today for initial psychosocial evaluation.  She is a 71 year old widow who had a heart attack early in February of this year.  She has a strong support system with sisters; a son and several adult grandchildren who live close by.  Timmothy Sours is also actively involved in her local church.  She reports sleeping well and having a good appetite.  Timmothy Sours denies a history of depression or anxiety or any current symptoms, but states the "Dr put me on anxiety medication when in the hospital."  And she is continuing to take this; which she reports helps her sleep.  She states she is typically in a good mood and has minimal stress in her life other than her health.    Timmothy Sours has goals to increase her stamina and strength (especially in her legs) while in this program; and has only been here 3 classes and has noticed a difference in being able to walk further without stopping.  Counselor commended Don on her commitment to exercise consistently and staff will be following with her throughout the course of this program.      Continue Psychosocial Services  Follow up required by staff     Discharge Psychosocial Assessment & Intervention   Comments Timmothy Sours will continue to  exercise consistently and follow the exercise program established for her.  She will participate in the psychoeducational components of this program as well.        Psychosocial Re-Evaluation:   Psychosocial Discharge (Final Psychosocial Re-Evaluation):   Vocational Rehabilitation: Provide vocational rehab assistance to qualifying candidates.   Vocational Rehab Evaluation & Intervention:     Vocational Rehab - 12/06/16 1330      Initial Vocational Rehab Evaluation & Intervention   Assessment shows need for Vocational Rehabilitation No      Education:  Education Goals: Education classes will be provided on a weekly basis, covering required topics. Participant will state understanding/return demonstration of topics presented.  Learning Barriers/Preferences:     Learning Barriers/Preferences - 12/06/16 1324      Learning Barriers/Preferences   Learning Barriers Hearing  Wears heaering aids   Learning Preferences Individual Instruction      Education Topics: General Nutrition Guidelines/Fats and Fiber: -Group instruction provided by verbal, written material, models and posters to present the general guidelines for heart healthy nutrition. Gives an explanation and review of dietary fats and fiber.   Controlling Sodium/Reading Food Labels: -Group verbal and written material supporting the discussion of sodium use in heart healthy nutrition. Review and explanation with models, verbal and written materials for utilization of the food label.   Exercise Physiology & Risk Factors: - Group verbal and written instruction with models to review the exercise physiology of the cardiovascular system and associated critical values. Details cardiovascular disease risk factors and the goals associated with each risk factor.   Aerobic Exercise & Resistance Training: - Gives group verbal and written discussion on the health impact of inactivity. On the components of aerobic and resistive  training programs and the benefits of this training and how to safely progress through these programs.   Cardiac Rehab from 01/06/2017 in [facility] Cardiac and Pulmonary Rehab  Date  12/09/16  Educator  AS  Instruction Review Code  2- meets goals/outcomes      Flexibility, Balance, General Exercise Guidelines: - Provides group verbal and written instruction on the benefits of flexibility and balance training programs. Provides general exercise guidelines with specific guidelines to those with heart or lung disease. Demonstration and skill practice provided.   Cardiac Rehab from 01/06/2017 in [facility] Cardiac and Pulmonary Rehab  Date  12/14/16  Educator  [facility]  Instruction Review Code  2- meets goals/outcomes      Stress Management: - Provides group verbal and written instruction about the health risks of elevated stress, cause of high stress, and healthy ways to reduce stress.   Cardiac Rehab from 01/06/2017 in [facility] Cardiac and Pulmonary Rehab  Date  12/21/16  Educator  [facility]  Instruction Review Code  2- meets goals/outcomes      Depression: - Provides group verbal and written instruction on the correlation between heart/lung disease and depressed mood, treatment options, and the stigmas associated with seeking treatment.   Anatomy & Physiology of the Heart: - Group verbal and written instruction and models provide basic cardiac anatomy and physiology, with the coronary electrical and arterial systems. Review of: AMI, Angina, Valve disease, Heart Failure, Cardiac Arrhythmia, Pacemakers, and the ICD.   Cardiac Rehab from 01/06/2017 in [facility] Cardiac and Pulmonary Rehab  Date  12/23/16  Educator  SB  Instruction Review Code  2- meets goals/outcomes      Cardiac Procedures: - Group verbal and written instruction and models to describe the testing methods done to diagnose heart disease. Reviews the outcomes of the test results. Describes the treatment choices: Medical Management, Angioplasty, or  Coronary Bypass Surgery.   Cardiac Rehab from 01/06/2017 in [facility] Cardiac and Pulmonary Rehab  Date  12/28/16  Educator  SB  Instruction Review Code  2- meets goals/outcomes      Cardiac Medications: - Group verbal and written instruction to review commonly prescribed medications for heart disease. Reviews the medication, class of the drug, and side effects. Includes the steps to properly store meds and maintain the prescription regimen.   Cardiac Rehab from  01/06/2017 in [facility] Cardiac and Pulmonary Rehab  Date  01/06/17  Educator  KS  Instruction Review Code  2- meets goals/outcomes [Part 1]      Go Sex-Intimacy & Heart Disease, Get SMART - Goal Setting: - Group verbal and written instruction through game format to discuss heart disease and the return to sexual intimacy. Provides group verbal and written material to discuss and apply goal setting through the application of the S.M.A.R.T. Method.   Cardiac Rehab from 01/06/2017 in [facility] Cardiac and Pulmonary Rehab  Date  12/28/16  Educator  SB  Instruction Review Code  2- meets goals/outcomes      Other Matters of the Heart: - Provides group verbal, written materials and models to describe Heart Failure, Angina, Valve Disease, and Diabetes in the realm of heart disease. Includes description of the disease process and treatment options available to the cardiac patient.   Cardiac Rehab from 01/06/2017 in [facility] Cardiac and Pulmonary Rehab  Date  12/23/16  Educator  SB  Instruction Review Code  2- meets goals/outcomes      Exercise & Equipment Safety: - Individual verbal instruction and demonstration of equipment use and safety with use of the equipment.   Cardiac Rehab from 01/06/2017 in [facility] Cardiac and Pulmonary Rehab  Date  12/06/16  Educator  Sb  Instruction Review Code  2- meets goals/outcomes      Infection Prevention: - Provides verbal and written material to individual with discussion of infection control including proper  hand washing and proper equipment cleaning during exercise session.   Cardiac Rehab from 01/06/2017 in [facility] Cardiac and Pulmonary Rehab  Date  12/06/16  Educator  Sb  Instruction Review Code  2- meets goals/outcomes      Falls Prevention: - Provides verbal and written material to individual with discussion of falls prevention and safety.   Cardiac Rehab from 01/06/2017 in [facility] Cardiac and Pulmonary Rehab  Date  12/06/16  Educator  SB  Instruction Review Code  2- meets goals/outcomes      Diabetes: - Individual verbal and written instruction to review signs/symptoms of diabetes, desired ranges of glucose level fasting, after meals and with exercise. Advice that pre and post exercise glucose checks will be done for 3 sessions at entry of program.    Knowledge Questionnaire Score:     Knowledge Questionnaire Score - 12/06/16 1330      Knowledge Questionnaire Score   Pre Score 15/28  Reviewed correct responses with Timmothy Sours today. She vebalized understanding of the responses      Core Components/Risk Factors/Patient Goals at Admission:     Personal Goals and Risk Factors at Admission - 12/06/16 1309      Core Components/Risk Factors/Patient Goals on Admission    Weight Management Obesity;Yes;Weight Loss   Intervention Weight Management: Develop a combined nutrition and exercise program designed to reach desired caloric intake, while maintaining appropriate intake of nutrient and fiber, sodium and fats, and appropriate energy expenditure required for the weight goal.;Weight Management: Provide education and appropriate resources to help participant work on and attain dietary goals.   Admit Weight 198 lb (89.8 kg)   Goal Weight: Short Term 195 lb (88.5 kg)   Goal Weight: Long Term 180 lb (81.6 kg)   Expected Outcomes Short Term: Continue to assess and modify interventions until short term weight is achieved;Long Term: Adherence to nutrition and physical activity/exercise program aimed  toward attainment of established weight goal;Weight Loss: Understanding of general recommendations for a balanced deficit meal  plan, which promotes 1-2 lb weight loss per week and includes a negative energy balance of [id number] kcal/d;Understanding recommendations for meals to include 15-35% energy as protein, 25-35% energy from fat, 35-60% energy from carbohydrates, less than '200mg'$  of dietary cholesterol, 20-35 gm of total fiber daily;Understanding of distribution of calorie intake throughout the day with the consumption of 4-5 meals/snacks   Hypertension Yes   Intervention Provide education on lifestyle modifcations including regular physical activity/exercise, weight management, moderate sodium restriction and increased consumption of fresh fruit, vegetables, and low fat dairy, alcohol moderation, and smoking cessation.;Monitor prescription use compliance.   Expected Outcomes Short Term: Continued assessment and intervention until BP is < 140/31m HG in hypertensive participants. < 130/865mHG in hypertensive participants with diabetes, heart failure or chronic kidney disease.;Long Term: Maintenance of blood pressure at goal levels.   Lipids Yes   Intervention Provide education and support for participant on nutrition & aerobic/resistive exercise along with prescribed medications to achieve LDL '70mg'$ , HDL >'40mg'$ .   Expected Outcomes Short Term: Participant states understanding of desired cholesterol values and is compliant with medications prescribed. Participant is following exercise prescription and nutrition guidelines.;Long Term: Cholesterol controlled with medications as prescribed, with individualized exercise RX and with personalized nutrition plan. Value goals: LDL < '70mg'$ , HDL > 40 mg.      Core Components/Risk Factors/Patient Goals Review:    Core Components/Risk Factors/Patient Goals at Discharge (Final Review):    ITP Comments:     ITP Comments    Row Name 12/06/16 1301 12/06/16 1333  12/15/16 0558 01/06/17 1038 01/12/17 0557   ITP Comments Meical review completed with initial ITP created. [facility] review completed with initial ITP created. Diagnosis documentation can be found in [facility] Encounter 10/25/2016 30 day review. Continue with ITP unless directed changes per Medical Director review  New to program DoJulio Sicksid not complete her rehab session.  During education DoTimmothy Soursot up and walked to the entrance to the gym to sit down holding her head.  She said that she was feeling very dizzy and had had multiple rounds of loose stool today.  He blood pressure had dropped 50 mmHg since checking into rehab and was 116/66 versus the 160 that she checked in at. We also checked her blood sugar since she was still dizzy and feeling off.  She was only 77 mg/dl.  She was given a granola bar as a snack.  After her snack, her dizziness has subsided and her blood sugar was back up to 96 mg/dl.  She was able to go home and discharge BP was 116/64. We enouraged her to let her doctor know if she continues to have symptoms. 30 day review. Continue with ITP unless directed changes per Medical Director review      Comments:

## 2017-01-13 DIAGNOSIS — Z955 Presence of coronary angioplasty implant and graft: Secondary | ICD-10-CM

## 2017-01-13 DIAGNOSIS — Z48812 Encounter for surgical aftercare following surgery on the circulatory system: Secondary | ICD-10-CM | POA: Diagnosis not present

## 2017-01-13 NOTE — Progress Notes (Signed)
Daily Session Note  Patient Details  Name: Betty Nielsen MRN: [id number] Date of Birth: September 01, 1946 Referring Provider:     Cardiac Rehab from 12/06/2016 in [facility] Cardiac and Pulmonary Rehab  Referring Provider  Lujean Amel MD      Encounter Date: 01/13/2017  Check In:     Session Check In - 01/13/17 1029      Check-In   Location [facility]-Cardiac & Pulmonary Rehab   Staff Present Alberteen Sam, MA, ACSM RCEP, Exercise Physiologist;Kinlie Janice Oletta Darter, BA, ACSM CEP, Exercise Physiologist  Darel Hong RN   Supervising physician immediately available to respond to emergencies See telemetry face sheet for immediately available ER MD   Medication changes reported     No   Fall or balance concerns reported    No   Warm-up and Cool-down Performed on first and last piece of equipment   Resistance Training Performed Yes   VAD Patient? No     Pain Assessment   Currently in Pain? No/denies         History  Smoking Status  . Former Smoker  Smokeless Tobacco  . Never Used    Goals Met:  Independence with exercise equipment Exercise tolerated well No report of cardiac concerns or symptoms Strength training completed today  Goals Unmet:  Not Applicable  Comments: Pt able to follow exercise prescription today without complaint.  Will continue to monitor for progression.    Dr. Emily Filbert is Medical Director for [facility] and [facility].

## 2017-01-18 ENCOUNTER — Encounter: Payer: Medicare Other | Attending: Internal Medicine | Admitting: Respiratory Therapy

## 2017-01-18 DIAGNOSIS — Z48812 Encounter for surgical aftercare following surgery on the circulatory system: Secondary | ICD-10-CM | POA: Insufficient documentation

## 2017-01-18 DIAGNOSIS — Z955 Presence of coronary angioplasty implant and graft: Secondary | ICD-10-CM | POA: Diagnosis not present

## 2017-01-18 NOTE — Progress Notes (Signed)
Daily Session Note  Patient Details  Name: Betty Nielsen MRN: [id number] Date of Birth: [birth year] Referring Provider:     Cardiac Rehab from 12/06/2016 in [facility] Cardiac and Pulmonary Rehab  Referring Provider  Lujean Amel MD      Encounter Date: 01/18/2017  Check In:     Session Check In - 01/18/17 0827      Check-In   Location [facility]-Cardiac & Pulmonary Rehab   Staff Present Alberteen Sam, MA, ACSM RCEP, Exercise Physiologist;Susanne Bice, RN, BSN, CCRP;Laureen Owens Shark, BS, RRT, Respiratory Therapist   Supervising physician immediately available to respond to emergencies See telemetry face sheet for immediately available ER MD   Medication changes reported     No   Fall or balance concerns reported    No   Warm-up and Cool-down Performed on first and last piece of equipment   Resistance Training Performed Yes   VAD Patient? No     Pain Assessment   Currently in Pain? No/denies   Multiple Pain Sites No         History  Smoking Status  . Former Smoker  Smokeless Tobacco  . Never Used    Goals Met:  Independence with exercise equipment Exercise tolerated well No report of cardiac concerns or symptoms Strength training completed today  Goals Unmet:  Not Applicable  Comments: Pt able to follow exercise prescription today without complaint.  Will continue to monitor for progression.    Dr. Emily Filbert is Medical Director for [facility] and [facility].

## 2017-01-20 ENCOUNTER — Encounter: Payer: Medicare Other | Admitting: *Deleted

## 2017-01-20 DIAGNOSIS — Z48812 Encounter for surgical aftercare following surgery on the circulatory system: Secondary | ICD-10-CM | POA: Diagnosis not present

## 2017-01-20 DIAGNOSIS — Z955 Presence of coronary angioplasty implant and graft: Secondary | ICD-10-CM

## 2017-01-20 NOTE — Progress Notes (Signed)
Daily Session Note  Patient Details  Name: Betty Nielsen MRN: [id number] Date of Birth: 09-Jan-1946 Referring Provider:     Cardiac Rehab from 12/06/2016 in [facility] Cardiac and Pulmonary Rehab  Referring Provider  Lujean Amel MD      Encounter Date: 01/20/2017  Check In:     Session Check In - 01/20/17 1043      Check-In   Location [facility]-Cardiac & Pulmonary Rehab   Staff Present Alberteen Sam, MA, ACSM RCEP, Exercise Physiologist;Amanda Oletta Darter, BA, ACSM CEP, Exercise Physiologist;Other  Darel Hong, RN, BSN   Supervising physician immediately available to respond to emergencies See telemetry face sheet for immediately available ER MD   Medication changes reported     No   Fall or balance concerns reported    No   Warm-up and Cool-down Performed on first and last piece of equipment   Resistance Training Performed Yes   VAD Patient? No     Pain Assessment   Currently in Pain? No/denies   Multiple Pain Sites No           Exercise Prescription Changes - 01/19/17 1400      Response to Exercise   Blood Pressure (Admit) 144/60   Blood Pressure (Exercise) 134/64   Blood Pressure (Exit) 120/64   Heart Rate (Admit) 68 bpm   Heart Rate (Exercise) 72 bpm   Heart Rate (Exit) 64 bpm   Rating of Perceived Exertion (Exercise) 13   Symptoms fatigue   Duration Continue with 45 min of aerobic exercise without signs/symptoms of physical distress.   Intensity THRR unchanged     Progression   Progression Continue to progress workloads to maintain intensity without signs/symptoms of physical distress.   Average METs 1.41     Resistance Training   Training Prescription Yes   Weight 2 lbs   Reps 10-15     Interval Training   Interval Training No     Treadmill   MPH 1.2   Grade 0   Minutes 15   METs 1.92     NuStep   Level 2   Minutes 15   METs 1.3     Biostep-RELP   Level 2   Minutes 15   METs 1     Home Exercise Plan   Plans to continue exercise at [employer] (comment)  [employer]   Frequency Add 3 additional days to program exercise sessions.   Initial Home Exercises Provided 12/21/16      History  Smoking Status  . Former Smoker  Smokeless Tobacco  . Never Used    Goals Met:  Independence with exercise equipment Exercise tolerated well No report of cardiac concerns or symptoms Strength training completed today  Goals Unmet:  Not Applicable  Comments: Pt able to follow exercise prescription today without complaint.  Will continue to monitor for progression. Don declined a nutrition appointment again.  She also expressed concern when I told her she was only half way through the program and still had a month to go.  I counted out her days and gave her a homework packet.  I asked her to let me know if she would like to graduate early.  I showed her that she has not made much progress and is not hitting her target heart rate and that I would like to see these happen before she were to graduate.  She is exercising at the [facility] three days a week too!   Dr. Emily Filbert is Medical Director  for [facility] and [facility].

## 2017-01-27 ENCOUNTER — Encounter: Payer: Self-pay | Admitting: *Deleted

## 2017-01-27 ENCOUNTER — Telehealth: Payer: Self-pay | Admitting: *Deleted

## 2017-01-27 DIAGNOSIS — Z955 Presence of coronary angioplasty implant and graft: Secondary | ICD-10-CM

## 2017-01-27 NOTE — Telephone Encounter (Signed)
Called to check on Betty Nielsen.  She has been out sick with a sinus infection since Monday.  I encouraged her to contact her doctor to see if there was anything else she could take and also suggested the saline solution spray (ie Netipot).  She hopes to return next week.

## 2017-02-09 ENCOUNTER — Encounter: Payer: Self-pay | Admitting: *Deleted

## 2017-02-09 DIAGNOSIS — Z955 Presence of coronary angioplasty implant and graft: Secondary | ICD-10-CM

## 2017-02-09 NOTE — Progress Notes (Signed)
Cardiac Individual Treatment Plan  Patient Details  Name: Betty Nielsen MRN: [id number] Date of Birth: [birth year] Referring Provider:     Cardiac Rehab from 12/06/2016 in [facility] Cardiac and Pulmonary Rehab  Referring Provider  Lujean Amel MD      Initial Encounter Date:    Cardiac Rehab from 12/06/2016 in [facility] Cardiac and Pulmonary Rehab  Date  12/06/16  Referring Provider  Lujean Amel MD      Visit Diagnosis: Status post coronary artery stent placement  Patient's Home Medications on Admission:  Current Outpatient Prescriptions:  .  acetaminophen (TYLENOL ARTHRITIS PAIN) 650 MG CR tablet, Take 650 mg by mouth every 8 (eight) hours as needed for pain., Disp: , Rfl:  .  aspirin 81 MG tablet, Take 81 mg by mouth daily., Disp: , Rfl:  .  cholecalciferol (VITAMIN D) 1000 units tablet, Take 1,000 Units by mouth daily., Disp: , Rfl:  .  citalopram (CELEXA) 20 MG tablet, Take 1 tablet (20 mg total) by mouth daily., Disp: 90 tablet, Rfl: 1 .  cloNIDine (CATAPRES) 0.1 MG tablet, Take 1 tablet (0.1 mg total) by mouth 2 (two) times daily., Disp: 60 tablet, Rfl: 5 .  irbesartan (AVAPRO) 300 MG tablet, Take 1 tablet (300 mg total) by mouth daily., Disp: 90 tablet, Rfl: 1 .  labetalol (NORMODYNE) 100 MG tablet, Take 1 tablet (100 mg total) by mouth 2 (two) times daily., Disp: 180 tablet, Rfl: 1 .  levothyroxine (SYNTHROID, LEVOTHROID) 75 MCG tablet, Take 1 tablet (75 mcg total) by mouth daily before breakfast., Disp: 30 tablet, Rfl: 6 .  meclizine (ANTIVERT) 12.5 MG tablet, Take 1 tablet (12.5 mg total) by mouth 3 (three) times daily as needed for dizziness., Disp: 15 tablet, Rfl: 0 .  nitroGLYCERIN (NITROSTAT) 0.4 MG SL tablet, Place 1 tablet (0.4 mg total) under the tongue every 5 (five) minutes as needed for chest pain., Disp: 20 tablet, Rfl: 12 .  Omega-3 Fatty Acids (FISH OIL PO), Take 1 capsule by mouth daily., Disp: , Rfl:  .  pantoprazole (PROTONIX) 40 MG tablet, Take 1 tablet (40 mg  total) by mouth daily., Disp: 90 tablet, Rfl: 1 .  ticagrelor (BRILINTA) 90 MG TABS tablet, Take 1 tablet (90 mg total) by mouth 2 (two) times daily., Disp: 60 tablet, Rfl: 6  Past Medical History: Past Medical History:  Diagnosis Date  . Arthrosis of knee   . Chronic kidney disease   . Hyperlipidemia   . Hypertension   . Loss of hearing   . Metabolic syndrome   . Plantar fasciitis     Tobacco Use: History  Smoking Status  . Former Smoker  Smokeless Tobacco  . 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 Participant demonstrates ability to understand and report RPE using BORG scale, to self-measure pulse accurately, and to acknowledge the importance of the exercise prescription.  Exercise Prescription Goal: Starting with aerobic activity 30 plus minutes a day, 3 days per week for initial exercise prescription. Provide home exercise prescription and guidelines that participant acknowledges understanding prior to discharge.  Activity Barriers & Risk Stratification:     Activity Barriers & Cardiac Risk Stratification - 12/06/16 1323      Activity Barriers & Cardiac Risk Stratification   Activity Barriers Arthritis;Deconditioning;Muscular Weakness;Balance Concerns  Arthritis is all over   Cardiac Risk Stratification High      6 Minute Walk:     6 Minute Walk    Row  Name 12/06/16 1454         6 Minute Walk   Phase Initial     Distance 2 feet     Walk Time 5.41 minutes     # of Rest Breaks 5  10 sec, 7 sec, 10 sec, 3 sec, 5 sec     MPH 1.26     METS 1.92     RPE 13     VO2 Peak 5.4     Symptoms Yes (comment)     Comments leg fatigue/pain 10/10 resolved with rest, gait unsteady     Resting HR 75 bpm     Resting BP 134/76     Max Ex. HR 97 bpm     Max Ex. BP 136/80     2 Minute Post BP 126/60        Oxygen Initial Assessment:   Oxygen Re-Evaluation:   Oxygen Discharge (Final Oxygen Re-Evaluation):   Initial Exercise Prescription:     Initial Exercise Prescription - 12/06/16 1400      Date of Initial Exercise RX and Referring Provider   Date 12/06/16   Referring Provider Lujean Amel MD     Treadmill   MPH 1.2   Grade 0   Minutes 15   METs 1.92     NuStep   Level 3   SPM 80   Minutes 15   METs 2     Biostep-RELP   Level 1   SPM 50   Minutes 15   METs 2     Prescription Details   Frequency (times per week) 2   Duration Progress to 45 minutes of aerobic exercise without signs/symptoms of physical distress     Intensity   THRR 40-80% of Max Heartrate 105-135   Ratings of Perceived Exertion 11-13   Perceived Dyspnea 0-4     Progression   Progression Continue to progress workloads to maintain intensity without signs/symptoms of physical distress.     Resistance Training   Training Prescription Yes   Weight 2 lbs   Reps 10-15      Perform Capillary Blood Glucose checks as needed.  Exercise Prescription Changes:     Exercise Prescription Changes    Row Name 12/06/16 1300 12/21/16 0900 12/23/16 1400 01/07/17 1600 01/19/17 1400     Response to Exercise   Blood Pressure (Admit) 134/76  - 134/72 112/68 144/60   Blood Pressure (Exercise) 136/80  - 118/74 122/70 134/64   Blood Pressure (Exit) 126/60  - 124/64 130/80 120/64   Heart Rate (Admit) 76 bpm  - 64 bpm 63 bpm 68 bpm   Heart Rate (Exercise) 97  bpm  - 89 bpm 88  bpm 72 bpm   Heart Rate (Exit) 61 bpm  - 64 bpm 67 bpm 64 bpm   Oxygen Saturation (Admit) 100 %  -  -  -  -   Oxygen Saturation (Exercise) 100 %  -  -  -  -   Rating of Perceived Exertion (Exercise) 13  - '13 13 13   '$ Symptoms leg fatigue 10/10  - fatigue fatigue fatigue   Comments walk test results  -  -  -  -   Duration  -  - Progress to 45 minutes of aerobic exercise without signs/symptoms of physical distress Progress to 45 minutes of aerobic exercise without signs/symptoms of physical distress Continue with 45 min of aerobic exercise without signs/symptoms of physical distress.   Intensity  -  - THRR unchanged THRR unchanged THRR unchanged     Progression   Progression  -  - Continue to progress workloads to maintain intensity without signs/symptoms of physical distress. Continue to progress workloads to maintain intensity without signs/symptoms of physical distress. Continue to progress workloads to maintain intensity without signs/symptoms of physical distress.   Average METs  -  - 1.77 1.81 1.41     Resistance Training   Training Prescription  - Yes Yes Yes Yes   Weight  - 2 lbs 2 lbs 2 lbs 2 lbs   Reps  - 10-15 10-15 10-15 10-15     Interval Training   Interval Training  -  - No No No     Treadmill   MPH  - 1.2 1.2 1.2 1.2   Grade  - 0 0 0 0   Minutes  - '15 15 15 15   '$ METs  - 1.92 1.92 1.92 1.92     NuStep   Level  - '3 1 1 2   '$ SPM  - 80 80 80  -   Minutes  - '15 15 15 15   '$ METs  - 2 1.4 1.5 1.3     Biostep-RELP   Level  - '1 2 2 2   '$ SPM  - 50 50 50  -   Minutes  - '15 15 15 15   '$ METs  - '2 2 2 1     '$ Home Exercise Plan   Plans to continue exercise at  - [employer] [employer] (comment)  [place] (comment)  [place] (comment)  [place] (comment)  [employer]   Frequency  - Add 3 additional days to program exercise sessions. Add 3 additional days to program exercise sessions. Add 3 additional days to program exercise  sessions. Add 3 additional days to program exercise sessions.   Initial Home Exercises Provided  - 12/21/16 12/21/16 12/21/16 12/21/16   Row Name 02/02/17 1500             Response to Exercise   Blood Pressure (Admit) 148/68       Blood Pressure (Exercise) 146/78       Blood Pressure (Exit) 122/72       Heart Rate (Admit) 67 bpm       Heart Rate (Exercise) 92 bpm       Heart Rate (Exit) 66 bpm       Rating of Perceived Exertion (Exercise) 13       Symptoms fatigue       Duration Progress to 45 minutes of aerobic exercise without signs/symptoms of physical distress       Intensity THRR unchanged  Progression   Progression Continue to progress workloads to maintain intensity without signs/symptoms of physical distress.       Average METs 1.71         Resistance Training   Training Prescription Yes       Weight 2 lbs       Reps 10-15         Interval Training   Interval Training No         Treadmill   MPH 1.2       Grade 0       Minutes 15       METs 1.92         NuStep   Level 2       Minutes 15       METs 1.5         Home Exercise Plan   Plans to continue exercise at [facility] (comment)  [employer]       Frequency Add 3 additional days to program exercise sessions.       Initial Home Exercises Provided 12/21/16          Exercise Comments:     Exercise Comments    Row Name 12/09/16 1027 01/07/17 1603         Exercise Comments First full day of exercise!  Patient was oriented to gym and equipment including functions, settings, policies, and procedures.  Patient's individual exercise prescription and treatment plan were reviewed.  All starting workloads were established based on the results of the 6 minute walk test done at initial orientation visit.  The plan for exercise progression was also introduced and progression will be customized based on patient's performance and goals. Betty Nielsen did not complete her rehab session.  During education Betty Nielsen  got up and walked to the entrance to the gym to sit down holding her head.  She said that she was feeling very dizzy and had had multiple rounds of loose stool today.  He blood pressure had dropped 50 mmHg since checking into rehab and was 116/66 versus the 160 that she checked in at. We also checked her blood sugar since she was still dizzy and feeling off.  She was only 77 mg/dl.  She was given a granola bar as a snack.  After her snack, her dizziness has subsided and her blood sugar was back up to 96 mg/dl.  She was able to go home and discharge BP was 116/64. We enouraged her to let her doctor know if she continues to have symptoms.         Exercise Goals and Review:     Exercise Goals    Row Name 12/06/16 1501             Exercise Goals   Increase Physical Activity Yes       Intervention Provide advice, education, support and counseling about physical activity/exercise needs.;Develop an individualized exercise prescription for aerobic and resistive training based on initial evaluation findings, risk stratification, comorbidities and participant's personal goals.       Expected Outcomes Achievement of increased cardiorespiratory fitness and enhanced flexibility, muscular endurance and strength shown through measurements of functional capacity and personal statement of participant.       Increase Strength and Stamina Yes       Intervention Provide advice, education, support and counseling about physical activity/exercise needs.;Develop an individualized exercise prescription for aerobic and resistive training based on initial evaluation findings, risk stratification, comorbidities and participant's personal  goals.       Expected Outcomes Achievement of increased cardiorespiratory fitness and enhanced flexibility, muscular endurance and strength shown through measurements of functional capacity and personal statement of participant.          Exercise Goals Re-Evaluation :     Exercise  Goals Re-Evaluation    Row Name 12/21/16 5397 12/23/16 1443 01/07/17 1602 01/19/17 1445 02/02/17 1518     Exercise Goal Re-Evaluation   Exercise Goals Review Increase Physical Activity;Increase Strenth and Stamina Increase Physical Activity;Increase Strenth and Stamina Increase Physical Activity;Increase Strenth and Stamina Increase Physical Activity;Increase Strenth and Stamina Increase Physical Activity;Increase Strenth and Stamina   Comments Reviewed home exercise with pt today.  Pt plans to continue to go to [facility] for exercise.  Reviewed THR, pulse, RPE, sign and symptoms, and when to call 911 or MD.  Also discussed weather considerations and indoor options.  Pt voiced understanding. Betty Nielsen has been doing well in rehab and is off to a good start.  She is already going to the [facility] for exercise on her off days with [place].  She is ready to start working on her workloads.  We will continue to monitor her progression. Betty Nielsen continues to do well in rehab. She is slow to make progress and we will start to pick that up more as she is getting stronger.  Currently we are still trying to get her to maintain the minimum speeds.  We will contiinue to monitor her progression. Betty Nielsen has been doing well in rehab. She missed a week while she was out sick.  Her workloads still are not improving as a result.  We will work with her to start to pick things up more.  We will continue to monitor her progression. Betty Nielsen has only had one full session since last update.  She has been out sick and out with back and leg pain.  Once cleared to return we will try to get her back to where she was prior to leaving.   Expected Outcomes Short and Long: Continue to go to [facility] for exercise. Short: Begin to move up some of Betty Nielsen's workloads.  Long: Continue to come to class to work on strength and stamina. Short: Move up workloads.  Long: Continue to work on IT sales professional. Short: Work on moving up workloads.  Long: Continue to work on strength and  stamina in class. Short: Await clearance to return to rehab.  Long: Continue to exercise daily.      Discharge Exercise Prescription (Final Exercise Prescription Changes):     Exercise Prescription Changes - 02/02/17 1500      Response to Exercise   Blood Pressure (Admit) 148/68   Blood Pressure (Exercise) 146/78   Blood Pressure (Exit) 122/72   Heart Rate (Admit) 67 bpm   Heart Rate (Exercise) 92 bpm   Heart Rate (Exit) 66 bpm   Rating of Perceived Exertion (Exercise) 13   Symptoms fatigue   Duration Progress to 45 minutes of aerobic exercise without signs/symptoms of physical distress   Intensity THRR unchanged     Progression   Progression Continue to progress workloads to maintain intensity without signs/symptoms of physical distress.   Average METs 1.71     Resistance Training   Training Prescription Yes   Weight 2 lbs   Reps 10-15     Interval Training   Interval Training No     Treadmill   MPH 1.2   Grade 0   Minutes 15   METs  1.92     NuStep   Level 2   Minutes 15   METs 1.5     Home Exercise Plan   Plans to continue exercise at [facility] (comment)  [employer]   Frequency Add 3 additional days to program exercise sessions.   Initial Home Exercises Provided 12/21/16      Nutrition:  Target Goals: Understanding of nutrition guidelines, daily intake of sodium '1500mg'$ , cholesterol '200mg'$ , calories 30% from fat and 7% or less from saturated fats, daily to have 5 or more servings of fruits and vegetables.  Biometrics:     Pre Biometrics - 12/06/16 1501      Pre Biometrics   Height 5' 5.5" (1.664 m)   Weight 192 lb 11.2 oz (87.4 kg)   Waist Circumference 37 inches   Hip Circumference 43 inches   Waist to Hip Ratio 0.86 %   BMI (Calculated) 31.6   Single Leg Stand 4.93 seconds       Nutrition Therapy Plan and Nutrition Goals:     Nutrition Therapy & Goals - 01/20/17 1047      Nutrition Therapy   RD appointment defered Yes      Intervention Plan   Intervention Prescribe, educate and counsel regarding individualized specific dietary modifications aiming towards targeted core components such as weight, hypertension, lipid management, diabetes, heart failure and other comorbidities.   Expected Outcomes Short Term Goal: Understand basic principles of dietary content, such as calories, fat, sodium, cholesterol and nutrients.      Nutrition Discharge: Rate Your Plate Scores:     Nutrition Assessments - 12/06/16 1308      MEDFICTS Scores   Pre Score --  Betty Nielsen did not want to complete the questionnaire. "I only eat little bits"       Nutrition Goals Re-Evaluation:   Nutrition Goals Discharge (Final Nutrition Goals Re-Evaluation):   Psychosocial: Target Goals: Acknowledge presence or absence of significant depression and/or stress, maximize coping skills, provide positive support system. Participant is able to verbalize types and ability to use techniques and skills needed for reducing stress and depression.   Initial Review & Psychosocial Screening:     Initial Psych Review & Screening - 12/06/16 1310      Initial Review   Current issues with None Identified     Family Dynamics   Good Support System? Yes  Son/his wife, adult grand children   Comments Has been widowed for 10 years     Barriers   Psychosocial barriers to participate in program There are no identifiable barriers or psychosocial needs.;The patient should benefit from training in stress management and relaxation.     Screening Interventions   Interventions Encouraged to exercise      Quality of Life Scores:      Quality of Life - 12/06/16 1312      Quality of Life Scores   Health/Function Pre 20.93 %   Socioeconomic Pre 20.83 %   Psych/Spiritual Pre 21 %   Family Pre 21 %   GLOBAL Pre 20.94 %      PHQ-9: Recent Review Flowsheet Data    Depression screen [facility] 2/9 12/06/2016 06/15/2016 11/21/2015 10/08/2015 06/05/2015   Decreased  Interest 0 0 0 0 0   Down, Depressed, Hopeless 0 0 0 0 0   PHQ - 2 Score 0 0 0 0 0   Altered sleeping 0 - - - -   Tired, decreased energy 1 - - - -   Change in appetite 0 - - - -  Feeling bad or failure about yourself  0 - - - -   Trouble concentrating 0 - - - -   Moving slowly or fidgety/restless 0 - - - -   Suicidal thoughts 0 - - - -   PHQ-9 Score 1 - - - -   Difficult doing work/chores Not difficult at all - - - -     Interpretation of Total Score  Total Score Depression Severity:  1-4 = Minimal depression, 5-9 = Mild depression, 10-14 = Moderate depression, 15-19 = Moderately severe depression, 20-27 = Severe depression   Psychosocial Evaluation and Intervention:     Psychosocial Evaluation - 12/16/16 0929      Psychosocial Evaluation & Interventions   Interventions Encouraged to exercise with the program and follow exercise prescription   Comments Counselor met with Ms. Duanne Moron) today for initial psychosocial evaluation.  She is a 71 year old widow who had a heart attack early in February of this year.  She has a strong support system with sisters; a son and several adult grandchildren who live close by.  Betty Nielsen is also actively involved in her local church.  She reports sleeping well and having a good appetite.  Betty Nielsen denies a history of depression or anxiety or any current symptoms, but states the "Dr put me on anxiety medication when in the hospital."  And she is continuing to take this; which she reports helps her sleep.  She states she is typically in a good mood and has minimal stress in her life other than her health.    Betty Nielsen has goals to increase her stamina and strength (especially in her legs) while in this program; and has only been here 3 classes and has noticed a difference in being able to walk further without stopping.  Counselor commended Betty Nielsen on her commitment to exercise consistently and staff will be following with her throughout the course of this program.       Continue Psychosocial Services  Follow up required by staff     Discharge Psychosocial Assessment & Intervention   Comments Betty Nielsen will continue to exercise consistently and follow the exercise program established for her.  She will participate in the psychoeducational components of this program as well.        Psychosocial Re-Evaluation:     Psychosocial Re-Evaluation    Row Name 01/18/17 [phone]             Psychosocial Re-Evaluation   Comments Counselor follow up with Betty Nielsen today reporting experiencing some progress and benefits from this program with her ability to walk further without shortness of breath.  She continues to be in a positive mood and is enjoying the social components as well as educational parts of this program.  Staff will continue to follow with Betty Nielsen throughout the course of this prgram.  Counselor commended her on her progress and commitment to her health and consistent exercise.            Psychosocial Discharge (Final Psychosocial Re-Evaluation):     Psychosocial Re-Evaluation - 01/18/17 0949      Psychosocial Re-Evaluation   Comments Counselor follow up with Betty Nielsen today reporting experiencing some progress and benefits from this program with her ability to walk further without shortness of breath.  She continues to be in a positive mood and is enjoying the social components as well as educational parts of this program.  Staff will continue to follow with Betty Nielsen throughout the course of this prgram.  Counselor  commended her on her progress and commitment to her health and consistent exercise.        Vocational Rehabilitation: Provide vocational rehab assistance to qualifying candidates.   Vocational Rehab Evaluation & Intervention:     Vocational Rehab - 12/06/16 1330      Initial Vocational Rehab Evaluation & Intervention   Assessment shows need for Vocational Rehabilitation No      Education: Education Goals: Education classes will be provided on a weekly  basis, covering required topics. Participant will state understanding/return demonstration of topics presented.  Learning Barriers/Preferences:     Learning Barriers/Preferences - 12/06/16 1324      Learning Barriers/Preferences   Learning Barriers Hearing  Wears heaering aids   Learning Preferences Individual Instruction      Education Topics: General Nutrition Guidelines/Fats and Fiber: -Group instruction provided by verbal, written material, models and posters to present the general guidelines for heart healthy nutrition. Gives an explanation and review of dietary fats and fiber.   Cardiac Rehab from 01/20/2017 in [facility] Cardiac and Pulmonary Rehab  Date  01/18/17  Educator  PI  Instruction Review Code  2- meets goals/outcomes      Controlling Sodium/Reading Food Labels: -Group verbal and written material supporting the discussion of sodium use in heart healthy nutrition. Review and explanation with models, verbal and written materials for utilization of the food label.   Exercise Physiology & Risk Factors: - Group verbal and written instruction with models to review the exercise physiology of the cardiovascular system and associated critical values. Details cardiovascular disease risk factors and the goals associated with each risk factor.   Aerobic Exercise & Resistance Training: - Gives group verbal and written discussion on the health impact of inactivity. On the components of aerobic and resistive training programs and the benefits of this training and how to safely progress through these programs.   Cardiac Rehab from 01/20/2017 in [facility] Cardiac and Pulmonary Rehab  Date  12/09/16  Educator  AS  Instruction Review Code  2- meets goals/outcomes      Flexibility, Balance, General Exercise Guidelines: - Provides group verbal and written instruction on the benefits of flexibility and balance training programs. Provides general exercise guidelines with specific guidelines to  those with heart or lung disease. Demonstration and skill practice provided.   Cardiac Rehab from 01/20/2017 in [facility] Cardiac and Pulmonary Rehab  Date  12/14/16  Educator  [facility]  Instruction Review Code  2- meets goals/outcomes      Stress Management: - Provides group verbal and written instruction about the health risks of elevated stress, cause of high stress, and healthy ways to reduce stress.   Cardiac Rehab from 01/20/2017 in [facility] Cardiac and Pulmonary Rehab  Date  12/21/16  Educator  [facility]  Instruction Review Code  2- meets goals/outcomes      Depression: - Provides group verbal and written instruction on the correlation between heart/lung disease and depressed mood, treatment options, and the stigmas associated with seeking treatment.   Anatomy & Physiology of the Heart: - Group verbal and written instruction and models provide basic cardiac anatomy and physiology, with the coronary electrical and arterial systems. Review of: AMI, Angina, Valve disease, Heart Failure, Cardiac Arrhythmia, Pacemakers, and the ICD.   Cardiac Rehab from 01/20/2017 in [facility] Cardiac and Pulmonary Rehab  Date  01/20/17  Educator  KS  Instruction Review Code  2- meets goals/outcomes      Cardiac Procedures: - Group verbal and written instruction and models to describe  the testing methods done to diagnose heart disease. Reviews the outcomes of the test results. Describes the treatment choices: Medical Management, Angioplasty, or Coronary Bypass Surgery.   Cardiac Rehab from 01/20/2017 in [facility] Cardiac and Pulmonary Rehab  Date  12/28/16  Educator  SB  Instruction Review Code  2- meets goals/outcomes      Cardiac Medications: - Group verbal and written instruction to review commonly prescribed medications for heart disease. Reviews the medication, class of the drug, and side effects. Includes the steps to properly store meds and maintain the prescription regimen.   Cardiac Rehab from 01/20/2017 in [facility] Cardiac  and Pulmonary Rehab  Date  01/06/17  Educator  KS  Instruction Review Code  2- meets goals/outcomes [Part 1]      Go Sex-Intimacy & Heart Disease, Get SMART - Goal Setting: - Group verbal and written instruction through game format to discuss heart disease and the return to sexual intimacy. Provides group verbal and written material to discuss and apply goal setting through the application of the S.M.A.R.T. Method.   Cardiac Rehab from 01/20/2017 in [facility] Cardiac and Pulmonary Rehab  Date  12/28/16  Educator  SB  Instruction Review Code  2- meets goals/outcomes      Other Matters of the Heart: - Provides group verbal, written materials and models to describe Heart Failure, Angina, Valve Disease, and Diabetes in the realm of heart disease. Includes description of the disease process and treatment options available to the cardiac patient.   Cardiac Rehab from 01/20/2017 in [facility] Cardiac and Pulmonary Rehab  Date  01/20/17  Educator  KS  Instruction Review Code  2- meets goals/outcomes      Exercise & Equipment Safety: - Individual verbal instruction and demonstration of equipment use and safety with use of the equipment.   Cardiac Rehab from 01/20/2017 in [facility] Cardiac and Pulmonary Rehab  Date  12/06/16  Educator  Sb  Instruction Review Code  2- meets goals/outcomes      Infection Prevention: - Provides verbal and written material to individual with discussion of infection control including proper hand washing and proper equipment cleaning during exercise session.   Cardiac Rehab from 01/20/2017 in [facility] Cardiac and Pulmonary Rehab  Date  12/06/16  Educator  Sb  Instruction Review Code  2- meets goals/outcomes      Falls Prevention: - Provides verbal and written material to individual with discussion of falls prevention and safety.   Cardiac Rehab from 01/20/2017 in [facility] Cardiac and Pulmonary Rehab  Date  12/06/16  Educator  SB  Instruction Review Code  2- meets goals/outcomes       Diabetes: - Individual verbal and written instruction to review signs/symptoms of diabetes, desired ranges of glucose level fasting, after meals and with exercise. Advice that pre and post exercise glucose checks will be done for 3 sessions at entry of program.    Knowledge Questionnaire Score:     Knowledge Questionnaire Score - 12/06/16 1330      Knowledge Questionnaire Score   Pre Score 15/28  Reviewed correct responses with Betty Nielsen today. She vebalized understanding of the responses      Core Components/Risk Factors/Patient Goals at Admission:     Personal Goals and Risk Factors at Admission - 12/06/16 1309      Core Components/Risk Factors/Patient Goals on Admission    Weight Management Obesity;Yes;Weight Loss   Intervention Weight Management: Develop a combined nutrition and exercise program designed to reach desired caloric intake, while maintaining appropriate intake  of nutrient and fiber, sodium and fats, and appropriate energy expenditure required for the weight goal.;Weight Management: Provide education and appropriate resources to help participant work on and attain dietary goals.   Admit Weight 198 lb (89.8 kg)   Goal Weight: Short Term 195 lb (88.5 kg)   Goal Weight: Long Term 180 lb (81.6 kg)   Expected Outcomes Short Term: Continue to assess and modify interventions until short term weight is achieved;Long Term: Adherence to nutrition and physical activity/exercise program aimed toward attainment of established weight goal;Weight Loss: Understanding of general recommendations for a balanced deficit meal plan, which promotes 1-2 lb weight loss per week and includes a negative energy balance of [phone] kcal/d;Understanding recommendations for meals to include 15-35% energy as protein, 25-35% energy from fat, 35-60% energy from carbohydrates, less than '200mg'$  of dietary cholesterol, 20-35 gm of total fiber daily;Understanding of distribution of calorie intake throughout the  day with the consumption of 4-5 meals/snacks   Hypertension Yes   Intervention Provide education on lifestyle modifcations including regular physical activity/exercise, weight management, moderate sodium restriction and increased consumption of fresh fruit, vegetables, and low fat dairy, alcohol moderation, and smoking cessation.;Monitor prescription use compliance.   Expected Outcomes Short Term: Continued assessment and intervention until BP is < 140/22m HG in hypertensive participants. < 130/841mHG in hypertensive participants with diabetes, heart failure or chronic kidney disease.;Long Term: Maintenance of blood pressure at goal levels.   Lipids Yes   Intervention Provide education and support for participant on nutrition & aerobic/resistive exercise along with prescribed medications to achieve LDL '70mg'$ , HDL >'40mg'$ .   Expected Outcomes Short Term: Participant states understanding of desired cholesterol values and is compliant with medications prescribed. Participant is following exercise prescription and nutrition guidelines.;Long Term: Cholesterol controlled with medications as prescribed, with individualized exercise RX and with personalized nutrition plan. Value goals: LDL < '70mg'$ , HDL > 40 mg.      Core Components/Risk Factors/Patient Goals Review:    Core Components/Risk Factors/Patient Goals at Discharge (Final Review):    ITP Comments:     ITP Comments    Row Name 12/06/16 1301 12/06/16 1333 12/15/16 0558 01/06/17 1038 01/12/17 0557   ITP Comments Meical review completed with initial ITP created. [facility] review completed with initial ITP created. Diagnosis documentation can be found in [facility] Encounter 10/25/2016 30 day review. Continue with ITP unless directed changes per Medical Director review  New to program DoJulio Sicksid not complete her rehab session.  During education DoTimmothy Soursot up and walked to the entrance to the  gym to sit down holding her head.  She said that she was feeling very dizzy and had had multiple rounds of loose stool today.  He blood pressure had dropped 50 mmHg since checking into rehab and was 116/66 versus the 160 that she checked in at. We also checked her blood sugar since she was still dizzy and feeling off.  She was only 77 mg/dl.  She was given a granola bar as a snack.  After her snack, her dizziness has subsided and her blood sugar was back up to 96 mg/dl.  She was able to go home and discharge BP was 116/64. We enouraged her to let her doctor know if she continues to have symptoms. 30 day review. Continue with ITP unless directed changes per Medical Director review   [place] 01/20/17 1047 01/27/17 0928 02/01/17 1449 02/09/17 0902     ITP  Comments Betty Nielsen declined a nutrition appointment again.  She also expressed concern when I told her she was only half way through the program and still had a month to go.  I counted out her days and gave her a homework packet.  I asked her to let me know if she would like to graduate early.  I showed her that she has not made much progress and is not hitting her target heart rate and that I would like to see these happen before she were to graduate.  She is exercising at the [facility] three days a week too! Called to check on Betty Nielsen.  She has been out sick with a sinus infection since Monday.  I encouraged her to contact her doctor to see if there was anything else she could take and also suggested the saline solution spray (ie Netipot).  She hopes to return next week. Betty Nielsen stopped by after seeing Dr Clayborn Bigness today.  She is still having low back and leg pain.  Dr Clayborn Bigness wants her to see Dr Doren Custard , who is her surgeon , before returning to exercise.  She will bring clearance when she can return. 30 day review. Continue with ITP unless directed changes per Medical Director review  Remains out with medical concerns       Comments:

## 2017-02-15 ENCOUNTER — Telehealth: Payer: Self-pay | Admitting: *Deleted

## 2017-02-15 ENCOUNTER — Encounter: Payer: Self-pay | Admitting: *Deleted

## 2017-02-15 DIAGNOSIS — Z955 Presence of coronary angioplasty implant and graft: Secondary | ICD-10-CM

## 2017-02-15 NOTE — Telephone Encounter (Signed)
Called to check on status of return.  Left message.  

## 2017-02-22 ENCOUNTER — Encounter: Payer: Medicare Other | Attending: Internal Medicine

## 2017-02-22 DIAGNOSIS — Z48812 Encounter for surgical aftercare following surgery on the circulatory system: Secondary | ICD-10-CM | POA: Insufficient documentation

## 2017-02-22 DIAGNOSIS — Z955 Presence of coronary angioplasty implant and graft: Secondary | ICD-10-CM | POA: Insufficient documentation

## 2017-02-23 ENCOUNTER — Emergency Department: Payer: Medicare Other

## 2017-02-23 ENCOUNTER — Other Ambulatory Visit: Payer: Self-pay

## 2017-02-23 ENCOUNTER — Emergency Department
Admission: EM | Admit: 2017-02-23 | Discharge: 2017-02-23 | Disposition: A | Discharge status: Home / Self Care | Payer: Medicare Other | Attending: Emergency Medicine | Admitting: Emergency Medicine

## 2017-02-23 DIAGNOSIS — N183 Chronic kidney disease, stage 3 (moderate): Secondary | ICD-10-CM | POA: Diagnosis not present

## 2017-02-23 DIAGNOSIS — Z87891 Personal history of nicotine dependence: Secondary | ICD-10-CM | POA: Diagnosis not present

## 2017-02-23 DIAGNOSIS — I129 Hypertensive chronic kidney disease with stage 1 through stage 4 chronic kidney disease, or unspecified chronic kidney disease: Secondary | ICD-10-CM | POA: Diagnosis not present

## 2017-02-23 DIAGNOSIS — Z79899 Other long term (current) drug therapy: Secondary | ICD-10-CM | POA: Insufficient documentation

## 2017-02-23 DIAGNOSIS — R55 Syncope and collapse: Secondary | ICD-10-CM | POA: Diagnosis not present

## 2017-02-23 DIAGNOSIS — Z7982 Long term (current) use of aspirin: Secondary | ICD-10-CM | POA: Insufficient documentation

## 2017-02-23 DIAGNOSIS — I252 Old myocardial infarction: Secondary | ICD-10-CM | POA: Insufficient documentation

## 2017-02-23 DIAGNOSIS — E871 Hypo-osmolality and hyponatremia: Secondary | ICD-10-CM | POA: Insufficient documentation

## 2017-02-23 LAB — CBC
HEMATOCRIT: 31.8 % — AB (ref 35.0–47.0)
Hemoglobin: 10.6 g/dL — ABNORMAL LOW (ref 12.0–16.0)
MCH: 31.3 pg (ref 26.0–34.0)
MCHC: 33.4 g/dL (ref 32.0–36.0)
MCV: 93.5 fL (ref 80.0–100.0)
Platelets: 305 10*3/uL (ref 150–440)
RBC: 3.4 MIL/uL — ABNORMAL LOW (ref 3.80–5.20)
RDW: 14.9 % — AB (ref 11.5–14.5)
WBC: 6.7 10*3/uL (ref 3.6–11.0)

## 2017-02-23 LAB — BASIC METABOLIC PANEL
Anion gap: 10 (ref 5–15)
BUN: 22 mg/dL — AB (ref 6–20)
CHLORIDE: 96 mmol/L — AB (ref 101–111)
CO2: 19 mmol/L — ABNORMAL LOW (ref 22–32)
Calcium: 9.2 mg/dL (ref 8.9–10.3)
Creatinine, Ser: 1.53 mg/dL — ABNORMAL HIGH (ref 0.44–1.00)
GFR calc Af Amer: 39 mL/min — ABNORMAL LOW (ref >60)
GFR calc non Af Amer: 33 mL/min — ABNORMAL LOW (ref >60)
GLUCOSE: 90 mg/dL (ref 65–99)
POTASSIUM: 4.2 mmol/L (ref 3.5–5.1)
Sodium: 125 mmol/L — ABNORMAL LOW (ref 135–145)

## 2017-02-23 LAB — TROPONIN I: Troponin I: <0.03 ng/mL (ref <0.03)

## 2017-02-23 IMAGING — CR DG CHEST 2V
1 series · 2 of 2 positions shown · non-contrast
Comparison: [DATE]

CLINICAL DATA: Syncope

EXAM:
CHEST  2 VIEW

[Series 1: x chest ap · 0.14mm/px · 2 of 2 slices shown]
[im 1/2]
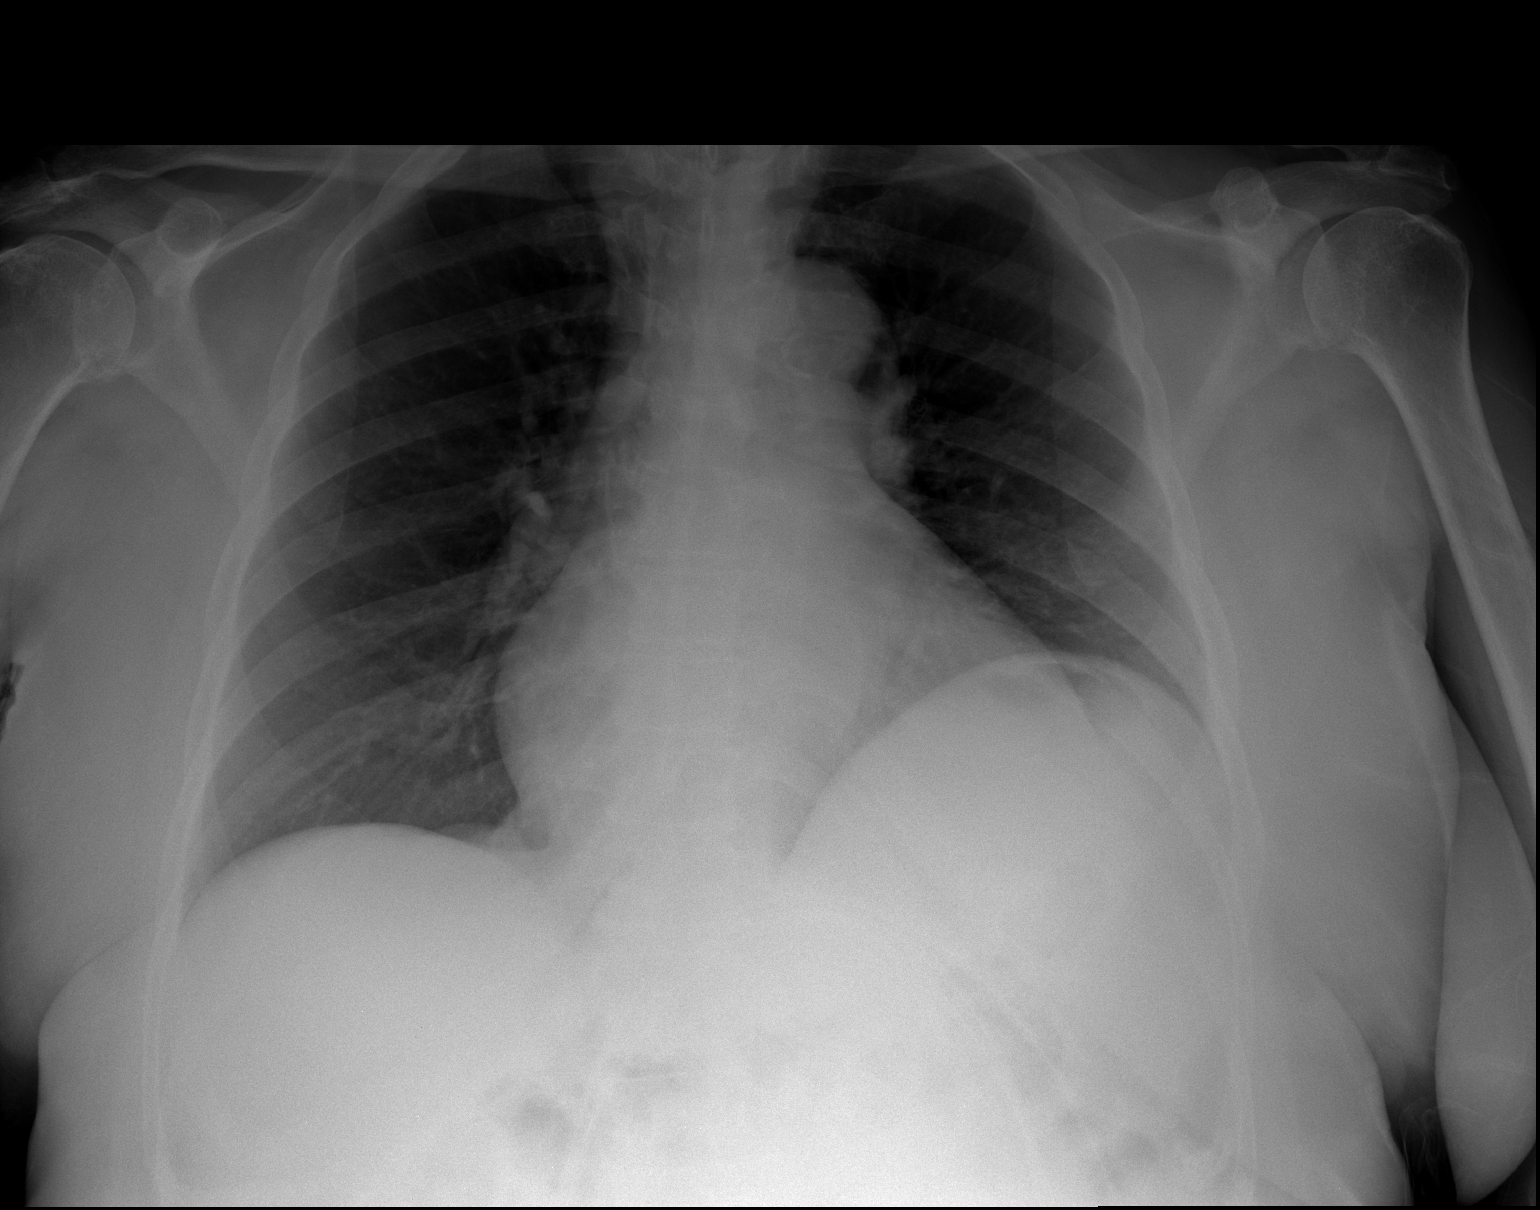
[im 2/2]
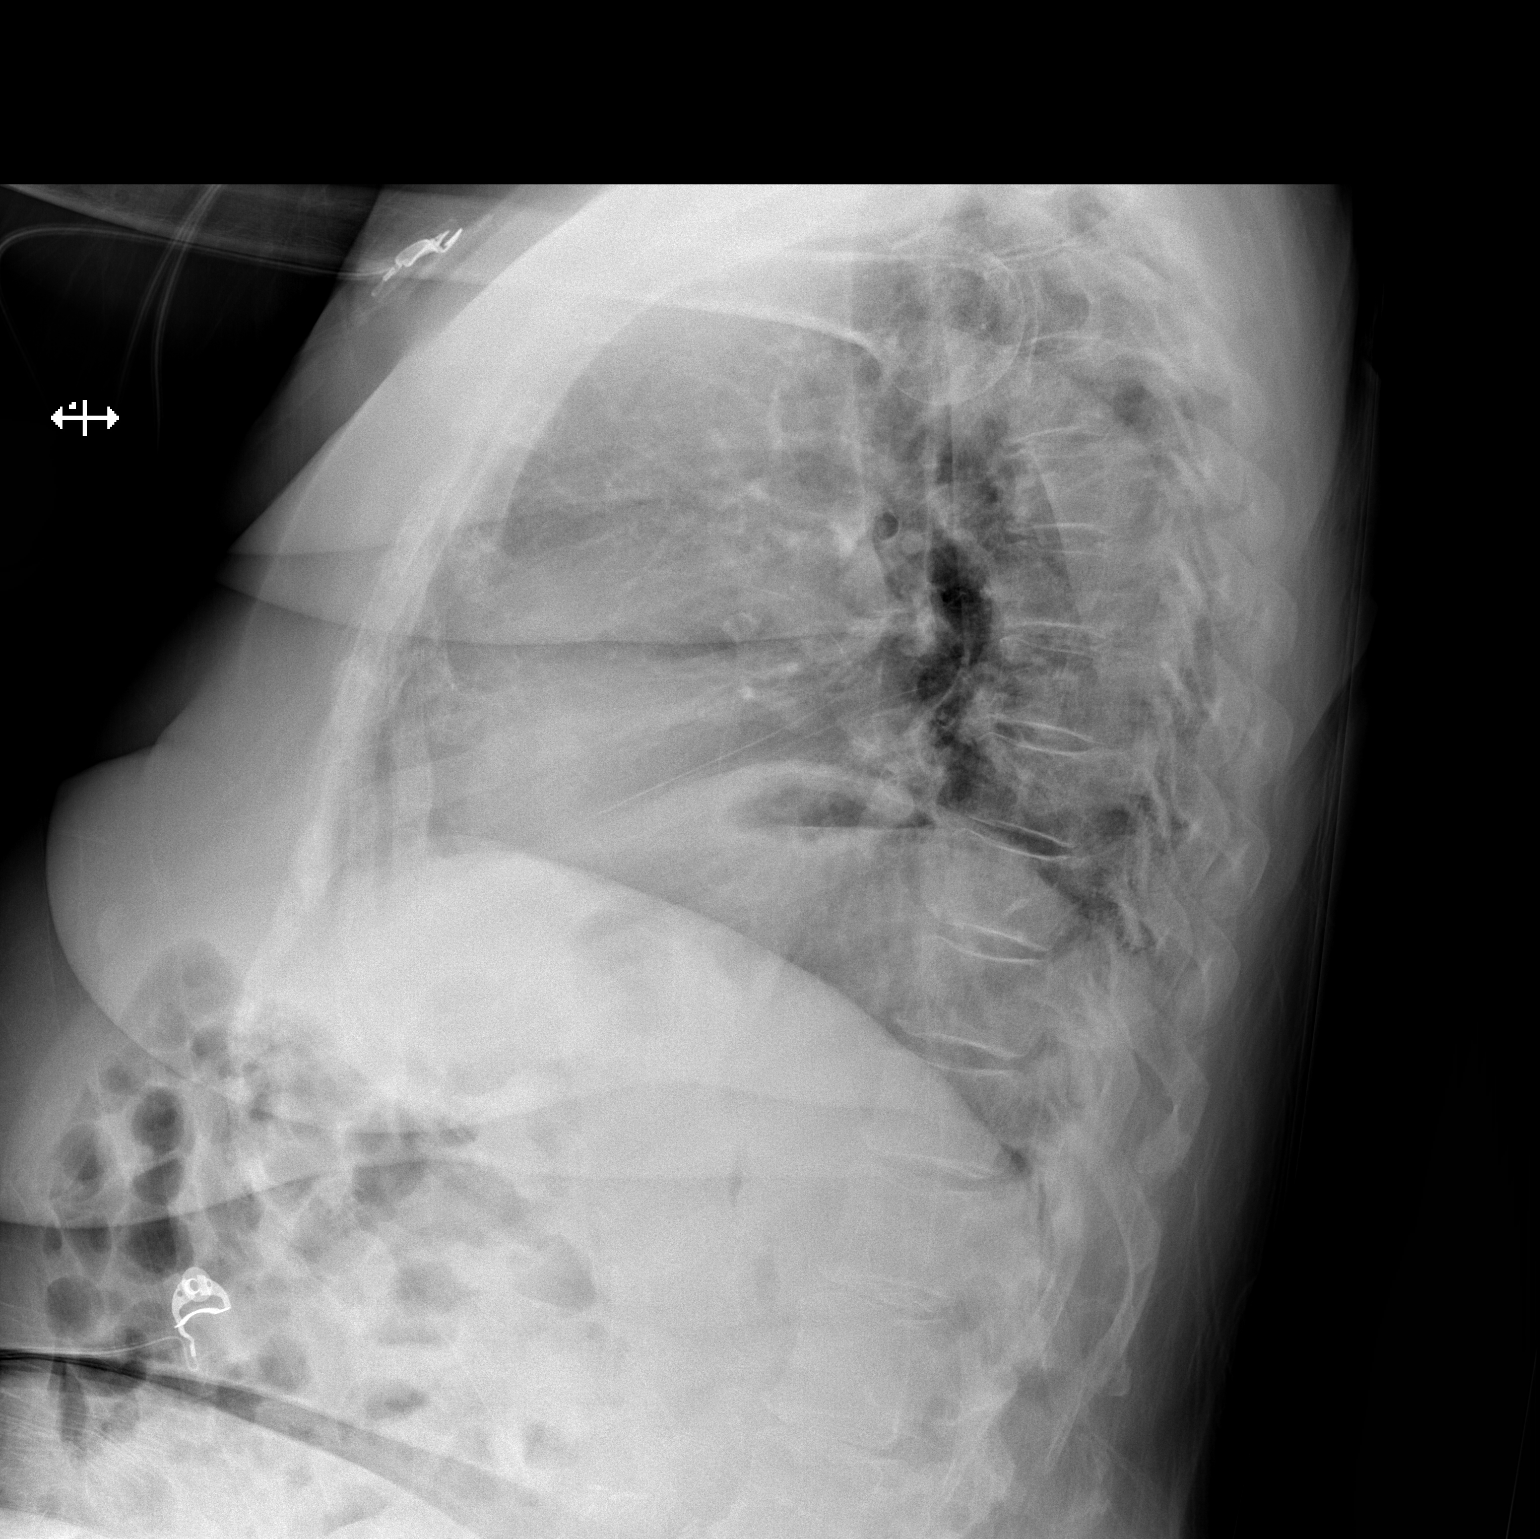

[2 of 2 positions shown; findings below may reference images not displayed]

FINDINGS: Chronic cardiomegaly. Stable aortic tortuosity. Chronic elevation of
the left diaphragm. There is no edema, consolidation, effusion, or
pneumothorax. No acute osseous finding.
IMPRESSION: 1. No acute finding.
2. Chronic cardiomegaly and left diaphragm elevation.

## 2017-02-23 MED ORDER — SODIUM CHLORIDE 0.9 % IV BOLUS (SEPSIS)
1000.0000 mL | Freq: Once | INTRAVENOUS | Status: AC
  Administered 2017-02-23: 1000 mL via INTRAVENOUS

## 2017-02-23 NOTE — ED Triage Notes (Signed)
Pt bib EMS from [place] w/ c/o syncopal episode. EMS sts pt hypotensive on scene SBP 60. Pt given 200 cc fluid and legs raised.  Pt A/OX4, sts that she feels fine. Denies CP, SOB n/v/d, dizziness or fevers. Pt resp even and unlabored, MAE.  EMS sts pt has had syncopal episodes before, on 3 BP medications. Pt sts she took all morning medications. Pt sts she ate lunch. CBG 100 per EMS.

## 2017-02-23 NOTE — ED Notes (Signed)
Patient refused wheel chair out. Walked with patient to [employer].

## 2017-02-23 NOTE — ED Provider Notes (Addendum)
[facility] Emergency Department Provider Note  ____________________________________________   I have reviewed the triage vital signs and the nursing notes.   HISTORY  Chief Complaint Loss of Consciousness    HPI Betty Nielsen is a 71 y.o. female who presents today after passing out briefly at the restaurant where she was having a very full lunch. Patient states that she felt a little lightheaded and passed out. She denies any chest returns breath nausea or vomiting. She does have a history of syncopal events. No recent change in her medications. She says last time she had a syncopal that she did not come in. She wants to not be here today. She wanted to just go home. She states "these happen sometimes. She denies any ongoing or antecedent illness. She denies any dysuria or urinary frequency chest pain or cough. She has no lightheadedness this time. She feels completely back to baseline. She states she has not changed or taken extra of her medications. She denies any abdominal pain melena bright red blood per rectum or hematemesis. She has had no other complaints she felt totally well during lunch and then afterwards just felt lightheaded.She is adamant that she would like to go home but she does consent to an evaluation here in the department.  Past Medical History:  Diagnosis Date  . Arthrosis of knee   . Chronic kidney disease   . Hyperlipidemia   . Hypertension   . Loss of hearing   . Metabolic syndrome   . Plantar fasciitis     Patient Active Problem List   Diagnosis Date Noted  . Elevated troponin 10/27/2016  . Essential hypertension, malignant 10/27/2016  . Headache 10/27/2016  . CKD (chronic kidney disease), stage III 10/27/2016  . Hyperlipidemia 10/27/2016  . Hyponatremia 10/27/2016  . History of non-ST elevation myocardial infarction (NSTEMI) 10/25/2016  . History of coronary angioplasty 10/25/2016  . BPV (benign positional vertigo) 06/15/2016   . 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[place]) 11/21/2015  . Anemia of chronic disease 06/05/2015  . Acid reflux 06/05/2015  . Hypertension, benign 06/05/2015  . Hypothyroidism, 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  . Major depression in remission ([place]) 06/05/2015  . Obesity (BMI 30.0-34.9) 06/05/2015  . Neuropathy 06/05/2015  . Perennial allergic rhinitis 06/05/2015  . Arthritis, degenerative 07/03/2014  . AAA (abdominal aortic aneurysm) without rupture ([place]) 06/26/2014    Past Surgical History:  Procedure Laterality Date  . ABDOMINAL AORTIC ANEURYSM REPAIR    . ABDOMINAL HYSTERECTOMY    . APPENDECTOMY    . CORONARY STENT INTERVENTION N/A 10/26/2016   Procedure: Coronary Stent Intervention;  Surgeon: Yolonda Kida, MD;  Location: [place] [facility];  Service: Cardiovascular;  Laterality: N/A;  . HERNIA REPAIR    . INNER EAR SURGERY     pt not sure of type  . LEFT HEART CATH AND CORONARY ANGIOGRAPHY N/A 10/26/2016   Procedure: Left Heart Cath and Coronary Angiography;  Surgeon: Teodoro Spray, MD;  Location: [place] [facility];  Service: Cardiovascular;  Laterality: N/A;  . THYROIDECTOMY      Prior to Admission medications   Medication Sig Start Date End Date Taking? Authorizing Provider  acetaminophen (TYLENOL ARTHRITIS PAIN) 650 MG CR tablet Take 650 mg by mouth every 8 (eight) hours as needed for pain.    [provider]  aspirin 81 MG tablet Take 81 mg by mouth daily.    [provider]  cholecalciferol (VITAMIN D) 1000 units tablet Take 1,000 Units by mouth daily.  [provider]  citalopram (CELEXA) 20 MG tablet Take 1 tablet (20 mg total) by mouth daily. 12/14/16   Steele Sizer, MD  cloNIDine (CATAPRES) 0.1 MG tablet Take 1 tablet (0.1 mg total) by mouth 2 (two) times daily. 11/10/16   Steele Sizer, MD  irbesartan (AVAPRO) 300 MG tablet Take 1 tablet (300 mg total) by mouth daily. 12/14/16   Steele Sizer, MD  labetalol (NORMODYNE) 100 MG tablet Take  1 tablet (100 mg total) by mouth 2 (two) times daily. 12/14/16   Steele Sizer, MD  levothyroxine (SYNTHROID, LEVOTHROID) 75 MCG tablet Take 1 tablet (75 mcg total) by mouth daily before breakfast. 10/27/16   Theodoro Grist, MD  meclizine (ANTIVERT) 12.5 MG tablet Take 1 tablet (12.5 mg total) by mouth 3 (three) times daily as needed for dizziness. 06/15/16   Steele Sizer, MD  nitroGLYCERIN (NITROSTAT) 0.4 MG SL tablet Place 1 tablet (0.4 mg total) under the tongue every 5 (five) minutes as needed for chest pain. 10/27/16   Theodoro Grist, MD  Omega-3 Fatty Acids (FISH OIL PO) Take 1 capsule by mouth daily.    [provider]  pantoprazole (PROTONIX) 40 MG tablet Take 1 tablet (40 mg total) by mouth daily. 12/14/16   Steele Sizer, MD  ticagrelor (BRILINTA) 90 MG TABS tablet Take 1 tablet (90 mg total) by mouth 2 (two) times daily. 10/27/16   Theodoro Grist, MD    Allergies Ace inhibitors; Ciprofloxacin; Hydrocodone-acetaminophen; Norvasc [amlodipine besylate]; Pantoprazole sodium; Pravastatin; and Sulfa antibiotics  Family History  Problem Relation Age of Onset  . Cerebral aneurysm Father   . Hypertension Sister     Social History Social History  Substance Use Topics  . Smoking status: Former Research scientist (life sciences)  . Smokeless tobacco: Never Used  . Alcohol use No    Review of Systems Constitutional: No fever/chills Eyes: No visual changes. ENT: No sore throat. No stiff neck no neck pain Cardiovascular: Denies chest pain. Respiratory: Denies shortness of breath. Gastrointestinal:   no vomiting.  No diarrhea.  No constipation. Genitourinary: Negative for dysuria. Musculoskeletal: Negative lower extremity swelling Skin: Negative for rash. Neurological: Negative for severe headaches, focal weakness or numbness.   ____________________________________________   PHYSICAL EXAM:  VITAL SIGNS: ED Triage Vitals  Enc Vitals Group     BP 02/23/17 1353 (!) 162/77     Pulse Rate 02/23/17  1343 (!) 59     Resp 02/23/17 1343 18     Temp 02/23/17 1343 97.9 F (36.6 C)     Temp Source 02/23/17 1343 Oral     SpO2 02/23/17 1343 100 %     Weight 02/23/17 1345 194 lb (88 kg)     Height 02/23/17 1345 5\' 5"  (1.651 m)     Head Circumference --      Peak Flow --      Pain Score 02/23/17 1343 0     Pain Loc --      Pain Edu? --      Excl. in [place]? --     Constitutional: Alert and oriented. Well appearing and in no acute distress. Eyes: Conjunctivae are normal Head: Atraumatic HEENT: No congestion/rhinnorhea. Mucous membranes are moist.  Oropharynx non-erythematous Neck:   Nontender with no meningismus, no masses, no stridor Cardiovascular: Normal rate, regular rhythm. Grossly normal heart sounds.  Good peripheral circulation. Respiratory: Normal respiratory effort.  No retractions. Lungs CTAB. Abdominal: Soft and nontender. No distention. No guarding no rebound Back:  There is no focal tenderness or step  off.  there is no midline tenderness there are no lesions noted. there is no CVA tenderness Musculoskeletal: No lower extremity tenderness, no upper extremity tenderness. No joint effusions, no DVT signs strong distal pulses no edema Neurologic:  Normal speech and language. No gross focal neurologic deficits are appreciated.  Skin:  Skin is warm, dry and intact. No rash noted. Psychiatric: Mood and affect are normal. Speech and behavior are normal.  ____________________________________________   LABS (all labs ordered are listed, but only abnormal results are displayed)  Labs Reviewed  BASIC METABOLIC PANEL  CBC  URINALYSIS, COMPLETE (UACMP) WITH MICROSCOPIC  CBG MONITORING, ED   ____________________________________________  EKG  I personally interpreted any EKGs ordered by me or triage Sinus pretty rate 55 no acute ST elevation or acute ST depression normal axis, side from mild bradycardia borderline LVH is noted no acute  ischemia ____________________________________________  RADIOLOGY  I reviewed any imaging ordered by me or triage that were performed during my shift and, if possible, patient and/or family made aware of any abnormal findings. ____________________________________________   PROCEDURES  Procedure(s) performed: None  Procedures  Critical Care performed: None  ____________________________________________   INITIAL IMPRESSION / ASSESSMENT AND PLAN / ED COURSE  Pertinent labs & imaging results that were available during my care of the patient were reviewed by me and considered in my medical decision making (see chart for details).  Patient here after a brief syncopal event after a very full meal that included multiple different items. Patient is in no acute distress she has no complaints, she may have vagal, she states she has done this before. However, we definitively cannot rule out dysrhythmias a cause of her syncopal event today without admission and I have told her about this but she is adamant that she will go home. She does except however that we can do blood work and keep her on the monitor while she is present here in the department. Her blood pressure is reassuring at this time, 150s over 70s. We will continue to monitor, don't think a CT of the head is indicated, no evidence of closed head injury. If blood work and vital signs remained reassuring and patient continues to refuse admission she likely will be able to go home  ----------------------------------------- 3:12 PM on 02/23/2017 -----------------------------------------  Signed out to dr. Alfred Levins at the end of my shift.    ____________________________________________   FINAL CLINICAL IMPRESSION(S) / ED DIAGNOSES  Final diagnoses:  None      This chart was dictated using voice recognition software.  Despite best efforts to proofread,  errors can occur which can change meaning.      Schuyler Amor,  MD 02/23/17 1456    Schuyler Amor, MD 02/23/17 [phone]

## 2017-02-23 NOTE — ED Notes (Addendum)
Went in to introduce myself to patient and check on her. Patient is more than ready to leave. Patient states, "there isn't anything wrong with me I am fine and ready to go home." patient needed to use the bathroom and refused to use the one in the room. Offered patient a wheel chair to go to the hall bathroom and she adamantly refused the wheel chair. Stand by assisted patient to bathroom. Patient is steady on feet.

## 2017-02-23 NOTE — ED Provider Notes (Signed)
-----------------------------------------   6:06 PM on 02/23/2017 -----------------------------------------   Blood pressure (!) 162/77, pulse (!) 59, temperature 97.9 F (36.6 C), temperature source Oral, resp. rate 18, height 5\' 5"  (1.651 m), weight 88 kg (194 lb), SpO2 100 %.  Assuming care from Dr. Burlene Arnt of [facility] is a 71 y.o. female with a chief complaint of Loss of Consciousness .    In summary, 71 y.o. female who presents today after passing out briefly at the restaurant after having a large meal.  I was asked to follow-up the results of patient's labs. Patient has a history of hyponatremia however her sodium was a little bit lower than her baseline at 125. Her kidney function was unchanged from patient's baseline. Troponin was negative. EKG with no evidence of ischemia or arrhythmia. Remaining of her blood work with no acute findings. Patient was very upset that she was brought to the emergency room and since arrival had been requesting to be discharged. I was able to convince her to receive IV fluids for her hyponatremia and I wanted to recheck her sodium however patient is demanding to go home. She will follow-up with Dr. Juleen China her nephrologist. I have emailed Dr. Juleen China to let him know about this patient and to insure close follow-up. I discussed with patient and her husband that she needs to return to the emergency room if she feels dizzy or she becomes confused. I explained to her that hyponatremia can lead to seizure and patient is in agreement to return. She will not stay for a second troponin. She has been monitored for 3 hours in the emergency room with no arrhythmias on telemetry and remained at her baseline. She is to be discharged home at this time.    Alfred Levins, [place], MD 02/23/17 [phone]

## 2017-03-01 ENCOUNTER — Telehealth: Payer: Self-pay | Admitting: *Deleted

## 2017-03-01 ENCOUNTER — Encounter: Payer: Self-pay | Admitting: *Deleted

## 2017-03-01 DIAGNOSIS — Z955 Presence of coronary angioplasty implant and graft: Secondary | ICD-10-CM

## 2017-03-01 NOTE — Progress Notes (Signed)
Discharge Summary  Patient Details  Name: Betty Nielsen MRN: [id number] Date of Birth: 02-02-1946 Referring Provider:     Cardiac Rehab from 12/06/2016 in [facility]  Referring Provider  Lujean Amel MD       Number of Visits: 21/36  Reason for Discharge:  Early Exit:  Personal  Smoking History:  History  Smoking Status  . Former Smoker  Smokeless Tobacco  . Never Used    Diagnosis:  Status post coronary artery stent placement  ADL UCSD:   Initial Exercise Prescription:     Initial Exercise Prescription - 12/06/16 1400      Date of Initial Exercise RX and Referring Provider   Date 12/06/16   Referring Provider Lujean Amel MD     Treadmill   MPH 1.2   Grade 0   Minutes 15   METs 1.92     NuStep   Level 3   SPM 80   Minutes 15   METs 2     Biostep-RELP   Level 1   SPM 50   Minutes 15   METs 2     Prescription Details   Frequency (times per week) 2   Duration Progress to 45 minutes of aerobic exercise without signs/symptoms of physical distress     Intensity   THRR 40-80% of Max Heartrate 105-135   Ratings of Perceived Exertion 11-13   Perceived Dyspnea 0-4     Progression   Progression Continue to progress workloads to maintain intensity without signs/symptoms of physical distress.     Resistance Training   Training Prescription Yes   Weight 2 lbs   Reps 10-15      Discharge Exercise Prescription (Final Exercise Prescription Changes):     Exercise Prescription Changes - 02/02/17 1500      Response to Exercise   Blood Pressure (Admit) 148/68   Blood Pressure (Exercise) 146/78   Blood Pressure (Exit) 122/72   Heart Rate (Admit) 67 bpm   Heart Rate (Exercise) 92 bpm   Heart Rate (Exit) 66 bpm   Rating of Perceived Exertion (Exercise) 13   Symptoms fatigue   Duration Progress to 45 minutes of aerobic exercise without signs/symptoms of physical distress   Intensity THRR unchanged     Progression   Progression Continue to progress workloads to maintain intensity without signs/symptoms of physical distress.   Average METs 1.71     Resistance Training   Training Prescription Yes   Weight 2 lbs   Reps 10-15     Interval Training   Interval Training No     Treadmill   MPH 1.2   Grade 0   Minutes 15   METs 1.92     NuStep   Level 2   Minutes 15   METs 1.5     Home Exercise Plan   Plans to continue exercise at [employer] (comment)  [employer]   Frequency Add 3 additional days to program exercise sessions.   Initial Home Exercises Provided 12/21/16      Functional Capacity:     6 Minute Walk    Row Name 12/06/16 1454         6 Minute Walk   Phase Initial     Distance 2 feet     Walk Time 5.41 minutes     # of Rest Breaks 5  10 sec, 7 sec, 10 sec, 3 sec, 5 sec     MPH 1.26     METS  1.92     RPE 13     VO2 Peak 5.4     Symptoms Yes (comment)     Comments leg fatigue/pain 10/10 resolved with rest, gait unsteady     Resting HR 75 bpm     Resting BP 134/76     Max Ex. HR 97 bpm     Max Ex. BP 136/80     2 Minute Post BP 126/60        Psychological, QOL, Others - Outcomes: PHQ 2/9: Depression screen [facility] 2/9 12/06/2016 06/15/2016 11/21/2015 10/08/2015 06/05/2015  Decreased Interest 0 0 0 0 0  Down, Depressed, Hopeless 0 0 0 0 0  PHQ - 2 Score 0 0 0 0 0  Altered sleeping 0 - - - -  Tired, decreased energy 1 - - - -  Change in appetite 0 - - - -  Feeling bad or failure about yourself  0 - - - -  Trouble concentrating 0 - - - -  Moving slowly or fidgety/restless 0 - - - -  Suicidal thoughts 0 - - - -  PHQ-9 Score 1 - - - -  Difficult doing work/chores Not difficult at all - - - -    Quality of Life:     Quality of Life - 12/06/16 1312      Quality of Life Scores   Health/Function Pre 20.93 %   Socioeconomic Pre 20.83 %   Psych/Spiritual Pre 21 %   Family Pre 21 %   GLOBAL Pre 20.94 %      Personal Goals: Goals established at orientation with  interventions provided to work toward goal.     Personal Goals and Risk Factors at Admission - 12/06/16 1309      Core Components/Risk Factors/Patient Goals on Admission    Weight Management Obesity;Yes;Weight Loss   Intervention Weight Management: Develop a combined nutrition and exercise program designed to reach desired caloric intake, while maintaining appropriate intake of nutrient and fiber, sodium and fats, and appropriate energy expenditure required for the weight goal.;Weight Management: Provide education and appropriate resources to help participant work on and attain dietary goals.   Admit Weight 198 lb (89.8 kg)   Goal Weight: Short Term 195 lb (88.5 kg)   Goal Weight: Long Term 180 lb (81.6 kg)   Expected Outcomes Short Term: Continue to assess and modify interventions until short term weight is achieved;Long Term: Adherence to nutrition and physical activity/exercise program aimed toward attainment of established weight goal;Weight Loss: Understanding of general recommendations for a balanced deficit meal plan, which promotes 1-2 lb weight loss per week and includes a negative energy balance of [phone] kcal/d;Understanding recommendations for meals to include 15-35% energy as protein, 25-35% energy from fat, 35-60% energy from carbohydrates, less than 200mg  of dietary cholesterol, 20-35 gm of total fiber daily;Understanding of distribution of calorie intake throughout the day with the consumption of 4-5 meals/snacks   Hypertension Yes   Intervention Provide education on lifestyle modifcations including regular physical activity/exercise, weight management, moderate sodium restriction and increased consumption of fresh fruit, vegetables, and low fat dairy, alcohol moderation, and smoking cessation.;Monitor prescription use compliance.   Expected Outcomes Short Term: Continued assessment and intervention until BP is < 140/44mm HG in hypertensive participants. < 130/42mm HG in hypertensive  participants with diabetes, heart failure or chronic kidney disease.;Long Term: Maintenance of blood pressure at goal levels.   Lipids Yes   Intervention Provide education and support for participant on nutrition & aerobic/resistive  exercise along with prescribed medications to achieve LDL 70mg , HDL >40mg .   Expected Outcomes Short Term: Participant states understanding of desired cholesterol values and is compliant with medications prescribed. Participant is following exercise prescription and nutrition guidelines.;Long Term: Cholesterol controlled with medications as prescribed, with individualized exercise RX and with personalized nutrition plan. Value goals: LDL < 70mg , HDL > 40 mg.       Personal Goals Discharge:   Nutrition & Weight - Outcomes:     Pre Biometrics - 12/06/16 1501      Pre Biometrics   Height 5' 5.5" (1.664 m)   Weight 192 lb 11.2 oz (87.4 kg)   Waist Circumference 37 inches   Hip Circumference 43 inches   Waist to Hip Ratio 0.86 %   BMI (Calculated) 31.6   Single Leg Stand 4.93 seconds       Nutrition:     Nutrition Therapy & Goals - 01/20/17 1047      Nutrition Therapy   RD appointment defered Yes     Intervention Plan   Intervention Prescribe, educate and counsel regarding individualized specific dietary modifications aiming towards targeted core components such as weight, hypertension, lipid management, diabetes, heart failure and other comorbidities.   Expected Outcomes Short Term Goal: Understand basic principles of dietary content, such as calories, fat, sodium, cholesterol and nutrients.      Nutrition Discharge:     Nutrition Assessments - 12/06/16 1308      MEDFICTS Scores   Pre Score --  Timmothy Sours did not want to complete the questionnaire. "I only eat little bits"       Education Questionnaire Score:     Knowledge Questionnaire Score - 12/06/16 1330      Knowledge Questionnaire Score   Pre Score 15/28  Reviewed correct responses  with Timmothy Sours today. She vebalized understanding of the responses      Goals reviewed with patient; copy given to patient.

## 2017-03-01 NOTE — Progress Notes (Signed)
Cardiac Individual Treatment Plan  Patient Details  Name: Betty Nielsen MRN: [id number] Date of Birth: [birth year] Referring Provider:     Cardiac Rehab from 12/06/2016 in [facility] Cardiac and Pulmonary Rehab  Referring Provider  Lujean Amel MD      Initial Encounter Date:    Cardiac Rehab from 12/06/2016 in [facility] Cardiac and Pulmonary Rehab  Date  12/06/16  Referring Provider  Lujean Amel MD      Visit Diagnosis: Status post coronary artery stent placement  Patient's Home Medications on Admission:  Current Outpatient Prescriptions:  .  acetaminophen (TYLENOL ARTHRITIS PAIN) 650 MG CR tablet, Take 650 mg by mouth every 8 (eight) hours as needed for pain., Disp: , Rfl:  .  aspirin 81 MG tablet, Take 81 mg by mouth daily., Disp: , Rfl:  .  cholecalciferol (VITAMIN D) 1000 units tablet, Take 1,000 Units by mouth daily., Disp: , Rfl:  .  citalopram (CELEXA) 20 MG tablet, Take 1 tablet (20 mg total) by mouth daily., Disp: 90 tablet, Rfl: 1 .  cloNIDine (CATAPRES) 0.1 MG tablet, Take 1 tablet (0.1 mg total) by mouth 2 (two) times daily., Disp: 60 tablet, Rfl: 5 .  irbesartan (AVAPRO) 300 MG tablet, Take 1 tablet (300 mg total) by mouth daily., Disp: 90 tablet, Rfl: 1 .  labetalol (NORMODYNE) 100 MG tablet, Take 1 tablet (100 mg total) by mouth 2 (two) times daily., Disp: 180 tablet, Rfl: 1 .  levothyroxine (SYNTHROID, LEVOTHROID) 75 MCG tablet, Take 1 tablet (75 mcg total) by mouth daily before breakfast., Disp: 30 tablet, Rfl: 6 .  meclizine (ANTIVERT) 12.5 MG tablet, Take 1 tablet (12.5 mg total) by mouth 3 (three) times daily as needed for dizziness., Disp: 15 tablet, Rfl: 0 .  nitroGLYCERIN (NITROSTAT) 0.4 MG SL tablet, Place 1 tablet (0.4 mg total) under the tongue every 5 (five) minutes as needed for chest pain., Disp: 20 tablet, Rfl: 12 .  Omega-3 Fatty Acids (FISH OIL PO), Take 1 capsule by mouth daily., Disp: , Rfl:  .  pantoprazole (PROTONIX) 40 MG tablet, Take 1 tablet (40 mg  total) by mouth daily., Disp: 90 tablet, Rfl: 1 .  ticagrelor (BRILINTA) 90 MG TABS tablet, Take 1 tablet (90 mg total) by mouth 2 (two) times daily., Disp: 60 tablet, Rfl: 6  Past Medical History: Past Medical History:  Diagnosis Date  . Arthrosis of knee   . Chronic kidney disease   . Hyperlipidemia   . Hypertension   . Loss of hearing   . Metabolic syndrome   . Plantar fasciitis     Tobacco Use: History  Smoking Status  . Former Smoker  Smokeless Tobacco  . 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 Participant demonstrates ability to understand and report RPE using BORG scale, to self-measure pulse accurately, and to acknowledge the importance of the exercise prescription.  Exercise Prescription Goal: Starting with aerobic activity 30 plus minutes a day, 3 days per week for initial exercise prescription. Provide home exercise prescription and guidelines that participant acknowledges understanding prior to discharge.  Activity Barriers & Risk Stratification:     Activity Barriers & Cardiac Risk Stratification - 12/06/16 1323      Activity Barriers & Cardiac Risk Stratification   Activity Barriers Arthritis;Deconditioning;Muscular Weakness;Balance Concerns  Arthritis is all over   Cardiac Risk Stratification High      6 Minute Walk:     6 Minute Walk    Row  Name 12/06/16 1454         6 Minute Walk   Phase Initial     Distance 2 feet     Walk Time 5.41 minutes     # of Rest Breaks 5  10 sec, 7 sec, 10 sec, 3 sec, 5 sec     MPH 1.26     METS 1.92     RPE 13     VO2 Peak 5.4     Symptoms Yes (comment)     Comments leg fatigue/pain 10/10 resolved with rest, gait unsteady     Resting HR 75 bpm     Resting BP 134/76     Max Ex. HR 97 bpm     Max Ex. BP 136/80     2 Minute Post BP 126/60        Oxygen Initial Assessment:   Oxygen Re-Evaluation:   Oxygen Discharge (Final Oxygen Re-Evaluation):   Initial Exercise Prescription:     Initial Exercise Prescription - 12/06/16 1400      Date of Initial Exercise RX and Referring Provider   Date 12/06/16   Referring Provider Lujean Amel MD     Treadmill   MPH 1.2   Grade 0   Minutes 15   METs 1.92     NuStep   Level 3   SPM 80   Minutes 15   METs 2     Biostep-RELP   Level 1   SPM 50   Minutes 15   METs 2     Prescription Details   Frequency (times per week) 2   Duration Progress to 45 minutes of aerobic exercise without signs/symptoms of physical distress     Intensity   THRR 40-80% of Max Heartrate 105-135   Ratings of Perceived Exertion 11-13   Perceived Dyspnea 0-4     Progression   Progression Continue to progress workloads to maintain intensity without signs/symptoms of physical distress.     Resistance Training   Training Prescription Yes   Weight 2 lbs   Reps 10-15      Perform Capillary Blood Glucose checks as needed.  Exercise Prescription Changes:     Exercise Prescription Changes    Row Name 12/06/16 1300 12/21/16 0900 12/23/16 1400 01/07/17 1600 01/19/17 1400     Response to Exercise   Blood Pressure (Admit) 134/76  - 134/72 112/68 144/60   Blood Pressure (Exercise) 136/80  - 118/74 122/70 134/64   Blood Pressure (Exit) 126/60  - 124/64 130/80 120/64   Heart Rate (Admit) 76 bpm  - 64 bpm 63 bpm 68 bpm   Heart Rate (Exercise) 97  bpm  - 89 bpm 88  bpm 72 bpm   Heart Rate (Exit) 61 bpm  - 64 bpm 67 bpm 64 bpm   Oxygen Saturation (Admit) 100 %  -  -  -  -   Oxygen Saturation (Exercise) 100 %  -  -  -  -   Rating of Perceived Exertion (Exercise) 13  - '13 13 13   '$ Symptoms leg fatigue 10/10  - fatigue fatigue fatigue   Comments walk test results  -  -  -  -   Duration  -  - Progress to 45 minutes of aerobic exercise without signs/symptoms of physical distress Progress to 45 minutes of aerobic exercise without signs/symptoms of physical distress Continue with 45 min of aerobic exercise without signs/symptoms of physical distress.   Intensity  -  - THRR unchanged THRR unchanged THRR unchanged     Progression   Progression  -  - Continue to progress workloads to maintain intensity without signs/symptoms of physical distress. Continue to progress workloads to maintain intensity without signs/symptoms of physical distress. Continue to progress workloads to maintain intensity without signs/symptoms of physical distress.   Average METs  -  - 1.77 1.81 1.41     Resistance Training   Training Prescription  - Yes Yes Yes Yes   Weight  - 2 lbs 2 lbs 2 lbs 2 lbs   Reps  - 10-15 10-15 10-15 10-15     Interval Training   Interval Training  -  - No No No     Treadmill   MPH  - 1.2 1.2 1.2 1.2   Grade  - 0 0 0 0   Minutes  - '15 15 15 15   '$ METs  - 1.92 1.92 1.92 1.92     NuStep   Level  - '3 1 1 2   '$ SPM  - 80 80 80  -   Minutes  - '15 15 15 15   '$ METs  - 2 1.4 1.5 1.3     Biostep-RELP   Level  - '1 2 2 2   '$ SPM  - 50 50 50  -   Minutes  - '15 15 15 15   '$ METs  - '2 2 2 1     '$ Home Exercise Plan   Plans to continue exercise at  - [facility] (comment)  [place] (comment)  [place] (comment)  [place] (comment)  [employer]   Frequency  - Add 3 additional days to program exercise sessions. Add 3 additional days to program exercise sessions. Add 3 additional days to program exercise  sessions. Add 3 additional days to program exercise sessions.   Initial Home Exercises Provided  - 12/21/16 12/21/16 12/21/16 12/21/16   Row Name 02/02/17 1500             Response to Exercise   Blood Pressure (Admit) 148/68       Blood Pressure (Exercise) 146/78       Blood Pressure (Exit) 122/72       Heart Rate (Admit) 67 bpm       Heart Rate (Exercise) 92 bpm       Heart Rate (Exit) 66 bpm       Rating of Perceived Exertion (Exercise) 13       Symptoms fatigue       Duration Progress to 45 minutes of aerobic exercise without signs/symptoms of physical distress       Intensity THRR unchanged  Progression   Progression Continue to progress workloads to maintain intensity without signs/symptoms of physical distress.       Average METs 1.71         Resistance Training   Training Prescription Yes       Weight 2 lbs       Reps 10-15         Interval Training   Interval Training No         Treadmill   MPH 1.2       Grade 0       Minutes 15       METs 1.92         NuStep   Level 2       Minutes 15       METs 1.5         Home Exercise Plan   Plans to continue exercise at [facility] (comment)  [employer]       Frequency Add 3 additional days to program exercise sessions.       Initial Home Exercises Provided 12/21/16          Exercise Comments:     Exercise Comments    Row Name 12/09/16 1027 01/07/17 1603         Exercise Comments First full day of exercise!  Patient was oriented to gym and equipment including functions, settings, policies, and procedures.  Patient's individual exercise prescription and treatment plan were reviewed.  All starting workloads were established based on the results of the 6 minute walk test done at initial orientation visit.  The plan for exercise progression was also introduced and progression will be customized based on patient's performance and goals. Julio Sicks did not complete her rehab session.  During education Betty Nielsen  got up and walked to the entrance to the gym to sit down holding her head.  She said that she was feeling very dizzy and had had multiple rounds of loose stool today.  He blood pressure had dropped 50 mmHg since checking into rehab and was 116/66 versus the 160 that she checked in at. We also checked her blood sugar since she was still dizzy and feeling off.  She was only 77 mg/dl.  She was given a granola bar as a snack.  After her snack, her dizziness has subsided and her blood sugar was back up to 96 mg/dl.  She was able to go home and discharge BP was 116/64. We enouraged her to let her doctor know if she continues to have symptoms.         Exercise Goals and Review:     Exercise Goals    Row Name 12/06/16 1501             Exercise Goals   Increase Physical Activity Yes       Intervention Provide advice, education, support and counseling about physical activity/exercise needs.;Develop an individualized exercise prescription for aerobic and resistive training based on initial evaluation findings, risk stratification, comorbidities and participant's personal goals.       Expected Outcomes Achievement of increased cardiorespiratory fitness and enhanced flexibility, muscular endurance and strength shown through measurements of functional capacity and personal statement of participant.       Increase Strength and Stamina Yes       Intervention Provide advice, education, support and counseling about physical activity/exercise needs.;Develop an individualized exercise prescription for aerobic and resistive training based on initial evaluation findings, risk stratification, comorbidities and participant's personal  goals.       Expected Outcomes Achievement of increased cardiorespiratory fitness and enhanced flexibility, muscular endurance and strength shown through measurements of functional capacity and personal statement of participant.          Exercise Goals Re-Evaluation :     Exercise  Goals Re-Evaluation    Row Name 12/21/16 8955 12/23/16 1443 01/07/17 1602 01/19/17 1445 02/02/17 1518     Exercise Goal Re-Evaluation   Exercise Goals Review Increase Physical Activity;Increase Strenth and Stamina Increase Physical Activity;Increase Strenth and Stamina Increase Physical Activity;Increase Strenth and Stamina Increase Physical Activity;Increase Strenth and Stamina Increase Physical Activity;Increase Strenth and Stamina   Comments Reviewed home exercise with pt today.  Pt plans to continue to go to [facility] for exercise.  Reviewed THR, pulse, RPE, sign and symptoms, and when to call 911 or MD.  Also discussed weather considerations and indoor options.  Pt voiced understanding. Betty Nielsen has been doing well in rehab and is off to a good start.  She is already going to the [facility] for exercise on her off days with [place].  She is ready to start working on her workloads.  We will continue to monitor her progression. Betty Nielsen continues to do well in rehab. She is slow to make progress and we will start to pick that up more as she is getting stronger.  Currently we are still trying to get her to maintain the minimum speeds.  We will contiinue to monitor her progression. Betty Nielsen has been doing well in rehab. She missed a week while she was out sick.  Her workloads still are not improving as a result.  We will work with her to start to pick things up more.  We will continue to monitor her progression. Betty Nielsen has only had one full session since last update.  She has been out sick and out with back and leg pain.  Once cleared to return we will try to get her back to where she was prior to leaving.   Expected Outcomes Short and Long: Continue to go to [facility] for exercise. Short: Begin to move up some of Betty Nielsen's workloads.  Long: Continue to come to class to work on strength and stamina. Short: Move up workloads.  Long: Continue to work on Nutritional therapist. Short: Work on moving up workloads.  Long: Continue to work on strength and  stamina in class. Short: Await clearance to return to rehab.  Long: Continue to exercise daily.   Row Name 02/15/17 1432             Exercise Goal Re-Evaluation   Comments Out since last review.          Discharge Exercise Prescription (Final Exercise Prescription Changes):     Exercise Prescription Changes - 02/02/17 1500      Response to Exercise   Blood Pressure (Admit) 148/68   Blood Pressure (Exercise) 146/78   Blood Pressure (Exit) 122/72   Heart Rate (Admit) 67 bpm   Heart Rate (Exercise) 92 bpm   Heart Rate (Exit) 66 bpm   Rating of Perceived Exertion (Exercise) 13   Symptoms fatigue   Duration Progress to 45 minutes of aerobic exercise without signs/symptoms of physical distress   Intensity THRR unchanged     Progression   Progression Continue to progress workloads to maintain intensity without signs/symptoms of physical distress.   Average METs 1.71     Resistance Training   Training Prescription Yes   Weight 2 lbs   Reps  10-15     Interval Training   Interval Training No     Treadmill   MPH 1.2   Grade 0   Minutes 15   METs 1.92     NuStep   Level 2   Minutes 15   METs 1.5     Home Exercise Plan   Plans to continue exercise at [facility] (comment)  [employer]   Frequency Add 3 additional days to program exercise sessions.   Initial Home Exercises Provided 12/21/16      Nutrition:  Target Goals: Understanding of nutrition guidelines, daily intake of sodium '1500mg'$ , cholesterol '200mg'$ , calories 30% from fat and 7% or less from saturated fats, daily to have 5 or more servings of fruits and vegetables.  Biometrics:     Pre Biometrics - 12/06/16 1501      Pre Biometrics   Height 5' 5.5" (1.664 m)   Weight 192 lb 11.2 oz (87.4 kg)   Waist Circumference 37 inches   Hip Circumference 43 inches   Waist to Hip Ratio 0.86 %   BMI (Calculated) 31.6   Single Leg Stand 4.93 seconds       Nutrition Therapy Plan and Nutrition Goals:      Nutrition Therapy & Goals - 01/20/17 1047      Nutrition Therapy   RD appointment defered Yes     Intervention Plan   Intervention Prescribe, educate and counsel regarding individualized specific dietary modifications aiming towards targeted core components such as weight, hypertension, lipid management, diabetes, heart failure and other comorbidities.   Expected Outcomes Short Term Goal: Understand basic principles of dietary content, such as calories, fat, sodium, cholesterol and nutrients.      Nutrition Discharge: Rate Your Plate Scores:     Nutrition Assessments - 12/06/16 1308      MEDFICTS Scores   Pre Score --  Betty Nielsen did not want to complete the questionnaire. "I only eat little bits"       Nutrition Goals Re-Evaluation:   Nutrition Goals Discharge (Final Nutrition Goals Re-Evaluation):   Psychosocial: Target Goals: Acknowledge presence or absence of significant depression and/or stress, maximize coping skills, provide positive support system. Participant is able to verbalize types and ability to use techniques and skills needed for reducing stress and depression.   Initial Review & Psychosocial Screening:     Initial Psych Review & Screening - 12/06/16 1310      Initial Review   Current issues with None Identified     Family Dynamics   Good Support System? Yes  Son/his wife, adult grand children   Comments Has been widowed for 10 years     Barriers   Psychosocial barriers to participate in program There are no identifiable barriers or psychosocial needs.;The patient should benefit from training in stress management and relaxation.     Screening Interventions   Interventions Encouraged to exercise      Quality of Life Scores:      Quality of Life - 12/06/16 1312      Quality of Life Scores   Health/Function Pre 20.93 %   Socioeconomic Pre 20.83 %   Psych/Spiritual Pre 21 %   Family Pre 21 %   GLOBAL Pre 20.94 %      PHQ-9: Recent Review  Flowsheet Data    Depression screen [facility] 2/9 12/06/2016 06/15/2016 11/21/2015 10/08/2015 06/05/2015   Decreased Interest 0 0 0 0 0   Down, Depressed, Hopeless 0 0 0 0 0   PHQ -  2 Score 0 0 0 0 0   Altered sleeping 0 - - - -   Tired, decreased energy 1 - - - -   Change in appetite 0 - - - -   Feeling bad or failure about yourself  0 - - - -   Trouble concentrating 0 - - - -   Moving slowly or fidgety/restless 0 - - - -   Suicidal thoughts 0 - - - -   PHQ-9 Score 1 - - - -   Difficult doing work/chores Not difficult at all - - - -     Interpretation of Total Score  Total Score Depression Severity:  1-4 = Minimal depression, 5-9 = Mild depression, 10-14 = Moderate depression, 15-19 = Moderately severe depression, 20-27 = Severe depression   Psychosocial Evaluation and Intervention:     Psychosocial Evaluation - 12/16/16 0929      Psychosocial Evaluation & Interventions   Interventions Encouraged to exercise with the program and follow exercise prescription   Comments Counselor met with Ms. Duanne Moron) today for initial psychosocial evaluation.  She is a 71 year old widow who had a heart attack early in February of this year.  She has a strong support system with sisters; a son and several adult grandchildren who live close by.  Betty Nielsen is also actively involved in her local church.  She reports sleeping well and having a good appetite.  Betty Nielsen denies a history of depression or anxiety or any current symptoms, but states the "Dr put me on anxiety medication when in the hospital."  And she is continuing to take this; which she reports helps her sleep.  She states she is typically in a good mood and has minimal stress in her life other than her health.    Betty Nielsen has goals to increase her stamina and strength (especially in her legs) while in this program; and has only been here 3 classes and has noticed a difference in being able to walk further without stopping.  Counselor commended Betty Nielsen on her commitment to  exercise consistently and staff will be following with her throughout the course of this program.      Continue Psychosocial Services  Follow up required by staff     Discharge Psychosocial Assessment & Intervention   Comments Betty Nielsen will continue to exercise consistently and follow the exercise program established for her.  She will participate in the psychoeducational components of this program as well.        Psychosocial Re-Evaluation:     Psychosocial Re-Evaluation    Row Name 01/18/17 [phone]             Psychosocial Re-Evaluation   Comments Counselor follow up with Betty Nielsen today reporting experiencing some progress and benefits from this program with her ability to walk further without shortness of breath.  She continues to be in a positive mood and is enjoying the social components as well as educational parts of this program.  Staff will continue to follow with Betty Nielsen throughout the course of this prgram.  Counselor commended her on her progress and commitment to her health and consistent exercise.            Psychosocial Discharge (Final Psychosocial Re-Evaluation):     Psychosocial Re-Evaluation - 01/18/17 0949      Psychosocial Re-Evaluation   Comments Counselor follow up with Betty Nielsen today reporting experiencing some progress and benefits from this program with her ability to walk further without shortness of breath.  She continues to be in a positive mood and is enjoying the social components as well as educational parts of this program.  Staff will continue to follow with Betty Nielsen throughout the course of this prgram.  Counselor commended her on her progress and commitment to her health and consistent exercise.        Vocational Rehabilitation: Provide vocational rehab assistance to qualifying candidates.   Vocational Rehab Evaluation & Intervention:     Vocational Rehab - 12/06/16 1330      Initial Vocational Rehab Evaluation & Intervention   Assessment shows need for Vocational  Rehabilitation No      Education: Education Goals: Education classes will be provided on a weekly basis, covering required topics. Participant will state understanding/return demonstration of topics presented.  Learning Barriers/Preferences:     Learning Barriers/Preferences - 12/06/16 1324      Learning Barriers/Preferences   Learning Barriers Hearing  Wears heaering aids   Learning Preferences Individual Instruction      Education Topics: General Nutrition Guidelines/Fats and Fiber: -Group instruction provided by verbal, written material, models and posters to present the general guidelines for heart healthy nutrition. Gives an explanation and review of dietary fats and fiber.   Cardiac Rehab from 01/20/2017 in [facility] Cardiac and Pulmonary Rehab  Date  01/18/17  Educator  PI  Instruction Review Code  2- meets goals/outcomes      Controlling Sodium/Reading Food Labels: -Group verbal and written material supporting the discussion of sodium use in heart healthy nutrition. Review and explanation with models, verbal and written materials for utilization of the food label.   Exercise Physiology & Risk Factors: - Group verbal and written instruction with models to review the exercise physiology of the cardiovascular system and associated critical values. Details cardiovascular disease risk factors and the goals associated with each risk factor.   Aerobic Exercise & Resistance Training: - Gives group verbal and written discussion on the health impact of inactivity. On the components of aerobic and resistive training programs and the benefits of this training and how to safely progress through these programs.   Cardiac Rehab from 01/20/2017 in [facility] Cardiac and Pulmonary Rehab  Date  12/09/16  Educator  AS  Instruction Review Code  2- meets goals/outcomes      Flexibility, Balance, General Exercise Guidelines: - Provides group verbal and written instruction on the benefits of  flexibility and balance training programs. Provides general exercise guidelines with specific guidelines to those with heart or lung disease. Demonstration and skill practice provided.   Cardiac Rehab from 01/20/2017 in [facility] Cardiac and Pulmonary Rehab  Date  12/14/16  Educator  [facility]  Instruction Review Code  2- meets goals/outcomes      Stress Management: - Provides group verbal and written instruction about the health risks of elevated stress, cause of high stress, and healthy ways to reduce stress.   Cardiac Rehab from 01/20/2017 in [facility] Cardiac and Pulmonary Rehab  Date  12/21/16  Educator  [facility]  Instruction Review Code  2- meets goals/outcomes      Depression: - Provides group verbal and written instruction on the correlation between heart/lung disease and depressed mood, treatment options, and the stigmas associated with seeking treatment.   Anatomy & Physiology of the Heart: - Group verbal and written instruction and models provide basic cardiac anatomy and physiology, with the coronary electrical and arterial systems. Review of: AMI, Angina, Valve disease, Heart Failure, Cardiac Arrhythmia, Pacemakers, and the ICD.   Cardiac Rehab from 01/20/2017 in  [facility]  Date  01/20/17  Educator  KS  Instruction Review Code  2- meets goals/outcomes      Cardiac Procedures: - Group verbal and written instruction and models to describe the testing methods done to diagnose heart disease. Reviews the outcomes of the test results. Describes the treatment choices: Medical Management, Angioplasty, or Coronary Bypass Surgery.   Cardiac Rehab from 01/20/2017 in [facility]  Date  12/28/16  Educator  SB  Instruction Review Code  2- meets goals/outcomes      Cardiac Medications: - Group verbal and written instruction to review commonly prescribed medications for heart disease. Reviews the medication, class of the drug, and side effects. Includes the steps to  properly store meds and maintain the prescription regimen.   Cardiac Rehab from 01/20/2017 in [facility] Re[facility]  Date  01/06/17  Educator  KS  Instruction Review Code  2- meets goals/outcomes [Part 1]      Go Sex-Intimacy & Heart Disease, Get SMART - Goal Setting: - Group verbal and written instruction through game format to discuss heart disease and the return to sexual intimacy. Provides group verbal and written material to discuss and apply goal setting through the application of the S.M.A.R.T. Method.   Cardiac Rehab from 01/20/2017 in [facility]  Date  12/28/16  Educator  SB  Instruction Review Code  2- meets goals/outcomes      Other Matters of the Heart: - Provides group verbal, written materials and models to describe Heart Failure, Angina, Valve Disease, and Diabetes in the realm of heart disease. Includes description of the disease process and treatment options available to the cardiac patient.   Cardiac Rehab from 01/20/2017 in [facility]  Date  01/20/17  Educator  KS  Instruction Review Code  2- meets goals/outcomes      Exercise & Equipment Safety: - Individual verbal instruction and demonstration of equipment use and safety with use of the equipment.   Cardiac Rehab from 01/20/2017 in [facility] Re[facility]  Date  12/06/16  Educator  Sb  Instruction Review Code  2- meets goals/outcomes      Infection Prevention: - Provides verbal and written material to individual with discussion of infection control including proper hand washing and proper equipment cleaning during exercise session.   Cardiac Rehab from 01/20/2017 in [facility] Re[facility]  Date  12/06/16  Educator  Sb  Instruction Review Code  2- meets goals/outcomes      Falls Prevention: - Provides verbal and written material to individual with discussion of falls prevention and safety.   Cardiac Rehab from 01/20/2017 in [facility] Cardiac and  Pulmonary Rehab  Date  12/06/16  Educator  SB  Instruction Review Code  2- meets goals/outcomes      Diabetes: - Individual verbal and written instruction to review signs/symptoms of diabetes, desired ranges of glucose level fasting, after meals and with exercise. Advice that pre and post exercise glucose checks will be done for 3 sessions at entry of program.    Knowledge Questionnaire Score:     Knowledge Questionnaire Score - 12/06/16 1330      Knowledge Questionnaire Score   Pre Score 15/28  Reviewed correct responses with Betty Nielsen today. She vebalized understanding of the responses      Core Components/Risk Factors/Patient Goals at Admission:     Personal Goals and Risk Factors at Admission - 12/06/16 1309  Core Components/Risk Factors/Patient Goals on Admission    Weight Management Obesity;Yes;Weight Loss   Intervention Weight Management: Develop a combined nutrition and exercise program designed to reach desired caloric intake, while maintaining appropriate intake of nutrient and fiber, sodium and fats, and appropriate energy expenditure required for the weight goal.;Weight Management: Provide education and appropriate resources to help participant work on and attain dietary goals.   Admit Weight 198 lb (89.8 kg)   Goal Weight: Short Term 195 lb (88.5 kg)   Goal Weight: Long Term 180 lb (81.6 kg)   Expected Outcomes Short Term: Continue to assess and modify interventions until short term weight is achieved;Long Term: Adherence to nutrition and physical activity/exercise program aimed toward attainment of established weight goal;Weight Loss: Understanding of general recommendations for a balanced deficit meal plan, which promotes 1-2 lb weight loss per week and includes a negative energy balance of [phone] kcal/d;Understanding recommendations for meals to include 15-35% energy as protein, 25-35% energy from fat, 35-60% energy from carbohydrates, less than '200mg'$  of dietary  cholesterol, 20-35 gm of total fiber daily;Understanding of distribution of calorie intake throughout the day with the consumption of 4-5 meals/snacks   Hypertension Yes   Intervention Provide education on lifestyle modifcations including regular physical activity/exercise, weight management, moderate sodium restriction and increased consumption of fresh fruit, vegetables, and low fat dairy, alcohol moderation, and smoking cessation.;Monitor prescription use compliance.   Expected Outcomes Short Term: Continued assessment and intervention until BP is < 140/3m HG in hypertensive participants. < 130/869mHG in hypertensive participants with diabetes, heart failure or chronic kidney disease.;Long Term: Maintenance of blood pressure at goal levels.   Lipids Yes   Intervention Provide education and support for participant on nutrition & aerobic/resistive exercise along with prescribed medications to achieve LDL '70mg'$ , HDL >'40mg'$ .   Expected Outcomes Short Term: Participant states understanding of desired cholesterol values and is compliant with medications prescribed. Participant is following exercise prescription and nutrition guidelines.;Long Term: Cholesterol controlled with medications as prescribed, with individualized exercise RX and with personalized nutrition plan. Value goals: LDL < '70mg'$ , HDL > 40 mg.      Core Components/Risk Factors/Patient Goals Review:    Core Components/Risk Factors/Patient Goals at Discharge (Final Review):    ITP Comments:     ITP Comments    Row Name 12/06/16 1301 12/06/16 1333 12/15/16 0558 01/06/17 1038 01/12/17 0557   ITP Comments Meical review completed with initial ITP created. [facility] review completed with initial ITP created. Diagnosis documentation can be found in [facility] Encounter 10/25/2016 30 day review. Continue with ITP unless directed changes per Medical Director review  New to program DoJulio Sicksid not complete her rehab session.  During education DoTimmothy Soursot up and walked to the entrance to the gym to sit down holding her head.  She said that she was feeling very dizzy and had had multiple rounds of loose stool today.  He blood pressure had dropped 50 mmHg since checking into rehab and was 116/66 versus the 160 that she checked in at. We also checked her blood sugar since she was still dizzy and feeling off.  She was only 77 mg/dl.  She was given a granola bar as a snack.  After her snack, her dizziness has subsided and her blood sugar was back up to 96 mg/dl.  She was able to go home and discharge BP was 116/64. We enouraged her to let her doctor know if  she continues to have symptoms. 30 day review. Continue with ITP unless directed changes per Medical Director review   Row Name 01/20/17 1047 01/27/17 0928 02/01/17 1449 02/09/17 0902 02/15/17 1432   ITP Comments Betty Nielsen declined a nutrition appointment again.  She also expressed concern when I told her she was only half way through the program and still had a month to go.  I counted out her days and gave her a homework packet.  I asked her to let me know if she would like to graduate early.  I showed her that she has not made much progress and is not hitting her target heart rate and that I would like to see these happen before she were to graduate.  She is exercising at the [facility] three days a week too! Called to check on Betty Nielsen.  She has been out sick with a sinus infection since Monday.  I encouraged her to contact her doctor to see if there was anything else she could take and also suggested the saline solution spray (ie Netipot).  She hopes to return next week. Betty Nielsen stopped by after seeing Dr Clayborn Bigness today.  She is still having low back and leg pain.  Dr Clayborn Bigness wants her to see Dr Doren Custard , who is her surgeon , before returning to exercise.  She will bring clearance when she can return. 30 day review. Continue with ITP unless directed changes per  Medical Director review  Remains out with medical concerns Called to check on status of return.  Left message.   [place] Name 03/01/17 1641           ITP Comments Called to check on status. Physician said that the air conditioner was making her hurt.  She got a shot, but then it bruised her hip.  She would like to be discharged at this time.          Comments: Discharge ITP

## 2017-03-01 NOTE — Telephone Encounter (Signed)
Called to check on status. Physician said that the air conditioner was making her hurt.  She got a shot, but then it bruised her hip.  She would like to be discharged at this time.

## 2017-03-03 ENCOUNTER — Other Ambulatory Visit: Payer: Self-pay | Admitting: Family Medicine

## 2017-03-03 DIAGNOSIS — F325 Major depressive disorder, single episode, in full remission: Secondary | ICD-10-CM

## 2017-03-03 MED ORDER — CITALOPRAM HYDROBROMIDE 10 MG PO TABS: 10.0000 mg | ORAL_TABLET | Freq: Every day | ORAL | 0 refills | Status: DC

## 2017-03-21 ENCOUNTER — Ambulatory Visit (INDEPENDENT_AMBULATORY_CARE_PROVIDER_SITE_OTHER): Payer: Medicare Other

## 2017-03-21 ENCOUNTER — Ambulatory Visit (INDEPENDENT_AMBULATORY_CARE_PROVIDER_SITE_OTHER): Payer: Medicare Other | Admitting: Family Medicine

## 2017-03-21 VITALS — BP 122/54 | HR 68 | Temp 97.9°F | Ht 65.0 in | Wt 189.9 lb

## 2017-03-21 VITALS — BP 122/54 | HR 68 | Temp 97.9°F | Resp 16 | Ht 64.5 in | Wt 189.6 lb

## 2017-03-21 DIAGNOSIS — F325 Major depressive disorder, single episode, in full remission: Secondary | ICD-10-CM | POA: Diagnosis not present

## 2017-03-21 DIAGNOSIS — D638 Anemia in other chronic diseases classified elsewhere: Secondary | ICD-10-CM | POA: Diagnosis not present

## 2017-03-21 DIAGNOSIS — N183 Chronic kidney disease, stage 3 unspecified: Secondary | ICD-10-CM

## 2017-03-21 DIAGNOSIS — I252 Old myocardial infarction: Secondary | ICD-10-CM

## 2017-03-21 DIAGNOSIS — E890 Postprocedural hypothyroidism: Secondary | ICD-10-CM

## 2017-03-21 DIAGNOSIS — M2550 Pain in unspecified joint: Secondary | ICD-10-CM

## 2017-03-21 DIAGNOSIS — I714 Abdominal aortic aneurysm, without rupture, unspecified: Secondary | ICD-10-CM

## 2017-03-21 DIAGNOSIS — I10 Essential (primary) hypertension: Secondary | ICD-10-CM

## 2017-03-21 DIAGNOSIS — E871 Hypo-osmolality and hyponatremia: Secondary | ICD-10-CM

## 2017-03-21 DIAGNOSIS — Z0000 Encounter for general adult medical examination without abnormal findings: Secondary | ICD-10-CM | POA: Diagnosis not present

## 2017-03-21 LAB — COMPLETE METABOLIC PANEL WITH GFR
ALT: 8 U/L (ref 6–29)
AST: 12 U/L (ref 10–35)
Albumin: 3.4 g/dL — ABNORMAL LOW (ref 3.6–5.1)
Alkaline Phosphatase: 82 U/L (ref 33–130)
BUN: 18 mg/dL (ref 7–25)
CO2: 22 mmol/L (ref 20–31)
Calcium: 8.7 mg/dL (ref 8.6–10.4)
Chloride: 94 mmol/L — ABNORMAL LOW (ref 98–110)
Creat: 1.5 mg/dL — ABNORMAL HIGH (ref 0.60–0.93)
GFR, EST AFRICAN AMERICAN: 40 mL/min — AB (ref >=60)
GFR, Est Non African American: 35 mL/min — ABNORMAL LOW (ref >=60)
Glucose, Bld: 82 mg/dL (ref 65–99)
Potassium: 3.9 mmol/L (ref 3.5–5.3)
Sodium: 125 mmol/L — ABNORMAL LOW (ref 135–146)
TOTAL PROTEIN: 6 g/dL — AB (ref 6.1–8.1)
Total Bilirubin: 0.3 mg/dL (ref 0.2–1.2)

## 2017-03-21 LAB — TSH: TSH: 0.18 mIU/L — ABNORMAL LOW

## 2017-03-21 NOTE — Patient Instructions (Signed)
Betty Nielsen , Thank you for taking time to come for your Medicare Wellness Visit. I appreciate your ongoing commitment to your health goals. Please review the following plan we discussed and let me know if I can assist you in the future.   Screening recommendations/referrals: Colonoscopy: declined Mammogram: declined Bone Density: declined Recommended yearly ophthalmology/optometry visit for glaucoma screening and checkup Recommended yearly dental visit for hygiene and checkup  Vaccinations: Influenza vaccine: due 05/2017 Pneumococcal vaccine: completed series Tdap vaccine: declined Shingles vaccine: declined  Advanced directives: Please bring a copy of your POA (Power of Attorney) and/or Living Will to your next appointment.   Conditions/risks identified: Obesity; Recommend to continue decreasing sodium intake in daily diet.  Next appointment: Non, need to schedule 1 year AWV.   Preventive Care 71 Years and Older, Female Preventive care refers to lifestyle choices and visits with your health care provider that can promote health and wellness. What does preventive care include?  A yearly physical exam. This is also called an annual well check.  Dental exams once or twice a year.  Routine eye exams. Ask your health care provider how often you should have your eyes checked.  Personal lifestyle choices, including:  Daily care of your teeth and gums.  Regular physical activity.  Eating a healthy diet.  Avoiding tobacco and drug use.  Limiting alcohol use.  Practicing safe sex.  Taking low-dose aspirin every day.  Taking vitamin and mineral supplements as recommended by your health care provider. What happens during an annual well check? The services and screenings done by your health care provider during your annual well check will depend on your age, overall health, lifestyle risk factors, and family history of disease. Counseling  Your health care provider may ask  you questions about your:  Alcohol use.  Tobacco use.  Drug use.  Emotional well-being.  Home and relationship well-being.  Sexual activity.  Eating habits.  History of falls.  Memory and ability to understand (cognition).  Work and work Statistician.  Reproductive health. Screening  You may have the following tests or measurements:  Height, weight, and BMI.  Blood pressure.  Lipid and cholesterol levels. These may be checked every 5 years, or more frequently if you are over 52 years old.  Skin check.  Lung cancer screening. You may have this screening every year starting at age 75 if you have a 30-pack-year history of smoking and currently smoke or have quit within the past 15 years.  Fecal occult blood test (FOBT) of the stool. You may have this test every year starting at age 55.  Flexible sigmoidoscopy or colonoscopy. You may have a sigmoidoscopy every 5 years or a colonoscopy every 10 years starting at age 87.  Hepatitis C blood test.  Hepatitis B blood test.  Sexually transmitted disease (STD) testing.  Diabetes screening. This is done by checking your blood sugar (glucose) after you have not eaten for a while (fasting). You may have this done every 1-3 years.  Bone density scan. This is done to screen for osteoporosis. You may have this done starting at age 61.  Mammogram. This may be done every 1-2 years. Talk to your health care provider about how often you should have regular mammograms. Talk with your health care provider about your test results, treatment options, and if necessary, the need for more tests. Vaccines  Your health care provider may recommend certain vaccines, such as:  Influenza vaccine. This is recommended every year.  Tetanus, diphtheria,  and acellular pertussis (Tdap, Td) vaccine. You may need a Td booster every 10 years.  Zoster vaccine. You may need this after age 28.  Pneumococcal 13-valent conjugate (PCV13) vaccine. One dose  is recommended after age 110.  Pneumococcal polysaccharide (PPSV23) vaccine. One dose is recommended after age 61. Talk to your health care provider about which screenings and vaccines you need and how often you need them. This information is not intended to replace advice given to you by your health care provider. Make sure you discuss any questions you have with your health care provider. Document Released: 10/03/2015 Document Revised: 05/26/2016 Document Reviewed: 07/08/2015 Elsevier Interactive Patient Education  2017 [place] Prevention in the Home Falls can cause injuries. They can happen to people of all ages. There are many things you can do to make your home safe and to help prevent falls. What can I do on the outside of my home?  Regularly fix the edges of walkways and driveways and fix any cracks.  Remove anything that might make you trip as you walk through a door, such as a raised step or threshold.  Trim any bushes or trees on the path to your home.  Use bright outdoor lighting.  Clear any walking paths of anything that might make someone trip, such as rocks or tools.  Regularly check to see if handrails are loose or broken. Make sure that both sides of any steps have handrails.  Any raised decks and porches should have guardrails on the edges.  Have any leaves, snow, or ice cleared regularly.  Use sand or salt on walking paths during winter.  Clean up any spills in your garage right away. This includes oil or grease spills. What can I do in the bathroom?  Use night lights.  Install grab bars by the toilet and in the tub and shower. Do not use towel bars as grab bars.  Use non-skid mats or decals in the tub or shower.  If you need to sit down in the shower, use a plastic, non-slip stool.  Keep the floor dry. Clean up any water that spills on the floor as soon as it happens.  Remove soap buildup in the tub or shower regularly.  Attach bath mats  securely with double-sided non-slip rug tape.  Do not have throw rugs and other things on the floor that can make you trip. What can I do in the bedroom?  Use night lights.  Make sure that you have a light by your bed that is easy to reach.  Do not use any sheets or blankets that are too big for your bed. They should not hang down onto the floor.  Have a firm chair that has side arms. You can use this for support while you get dressed.  Do not have throw rugs and other things on the floor that can make you trip. What can I do in the kitchen?  Clean up any spills right away.  Avoid walking on wet floors.  Keep items that you use a lot in easy-to-reach places.  If you need to reach something above you, use a strong step stool that has a grab bar.  Keep electrical cords out of the way.  Do not use floor polish or wax that makes floors slippery. If you must use wax, use non-skid floor wax.  Do not have throw rugs and other things on the floor that can make you trip. What can I do with my  stairs?  Do not leave any items on the stairs.  Make sure that there are handrails on both sides of the stairs and use them. Fix handrails that are broken or loose. Make sure that handrails are as long as the stairways.  Check any carpeting to make sure that it is firmly attached to the stairs. Fix any carpet that is loose or worn.  Avoid having throw rugs at the top or bottom of the stairs. If you do have throw rugs, attach them to the floor with carpet tape.  Make sure that you have a light switch at the top of the stairs and the bottom of the stairs. If you do not have them, ask someone to add them for you. What else can I do to help prevent falls?  Wear shoes that:  Do not have high heels.  Have rubber bottoms.  Are comfortable and fit you well.  Are closed at the toe. Do not wear sandals.  If you use a stepladder:  Make sure that it is fully opened. Do not climb a closed  stepladder.  Make sure that both sides of the stepladder are locked into place.  Ask someone to hold it for you, if possible.  Clearly mark and make sure that you can see:  Any grab bars or handrails.  First and last steps.  Where the edge of each step is.  Use tools that help you move around (mobility aids) if they are needed. These include:  Canes.  Walkers.  Scooters.  Crutches.  Turn on the lights when you go into a dark area. Replace any light bulbs as soon as they burn out.  Set up your furniture so you have a clear path. Avoid moving your furniture around.  If any of your floors are uneven, fix them.  If there are any pets around you, be aware of where they are.  Review your medicines with your doctor. Some medicines can make you feel dizzy. This can increase your chance of falling. Ask your doctor what other things that you can do to help prevent falls. This information is not intended to replace advice given to you by your health care provider. Make sure you discuss any questions you have with your health care provider. Document Released: 07/03/2009 Document Revised: 02/12/2016 Document Reviewed: 10/11/2014 Elsevier Interactive Patient Education  2017 [employer].

## 2017-03-21 NOTE — Progress Notes (Signed)
Name: Betty Nielsen   MRN: [id number]    DOB: November 14, 1945   Date:03/22/2017       Progress Note  Subjective  Chief Complaint  Chief Complaint  Patient presents with  . Abnormal Lab  . Coronary Artery Disease    HPI  Hyponatremia: she had a syncopal episode 02/23/2017, she was found to have high bp and low sodium. She has been seeing Dr. Abigail Butts, we discussed over the phone and we stopped her SSRI - Citalopram to see if sodium level would improve. She is feeling well since discharge.   CKI III/ Hyponatremia: we will recheck labs and send results to Dr. Abigail Butts.   HTN/CAD: doing well bp is at goal, she states she has decrease in exercise tolerance. She has not used any NTG since Feb 2018 ( when she had MI). 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 Jefm Bryant, tender joints and muscles all over, started on low dose prednisone a few weeks ago and is doing better.   Atherosclerosis abdominal aorta/hyperlipidemia: unable to tolerate statin, but on Brilinta and aspirin, she denies easy bleeding.    Patient Active Problem List   Diagnosis Date Noted  . Elevated troponin 10/27/2016  . Essential hypertension, malignant 10/27/2016  . Headache 10/27/2016  . CKD (chronic kidney disease), stage III 10/27/2016  . Hyperlipidemia 10/27/2016  . Hyponatremia 10/27/2016  . History of non-ST elevation myocardial infarction (NSTEMI) 10/25/2016  . History of coronary angioplasty 10/25/2016  . BPV (benign positional vertigo) 06/15/2016  . 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[place]) 11/21/2015  . Anemia of chronic disease 06/05/2015  . Acid reflux 06/05/2015  . Hypertension, benign 06/05/2015  . Hypothyroidism, 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  . Major depression in remission ([place]) 06/05/2015  . Obesity (BMI 30.0-34.9) 06/05/2015  . Neuropathy 06/05/2015  . Perennial allergic rhinitis 06/05/2015  . Arthritis, degenerative 07/03/2014   . AAA (abdominal aortic aneurysm) without rupture ([place]) 06/26/2014    Past Surgical History:  Procedure Laterality Date  . ABDOMINAL AORTIC ANEURYSM REPAIR    . ABDOMINAL HYSTERECTOMY    . APPENDECTOMY    . CORONARY STENT INTERVENTION N/A 10/26/2016   Procedure: Coronary Stent Intervention;  Surgeon: Yolonda Kida, MD;  Location: [place] [facility];  Service: Cardiovascular;  Laterality: N/A;  . HERNIA REPAIR    . INNER EAR SURGERY     pt not sure of type  . LEFT HEART CATH AND CORONARY ANGIOGRAPHY N/A 10/26/2016   Procedure: Left Heart Cath and Coronary Angiography;  Surgeon: Teodoro Spray, MD;  Location: [place] [facility];  Service: Cardiovascular;  Laterality: N/A;  . THYROIDECTOMY      Family History  Problem Relation Age of Onset  . Cerebral aneurysm Father   . Hypertension Sister     Social History   Social History  . Marital status: Widowed    Spouse name: N/A  . Number of children: N/A  . Years of education: N/A   Occupational History  . Not on file.   Social History Main Topics  . Smoking status: Former Smoker    Types: Cigarettes  . Smokeless tobacco: Never Used  . Alcohol use No  . Drug use: No  . Sexual activity: Not Currently   Other Topics Concern  . Not on file   Social History Narrative  . No narrative on file     Current Outpatient Prescriptions:  .  aspirin 81 MG tablet, Take 81  mg by mouth daily., Disp: , Rfl:  .  cloNIDine (CATAPRES) 0.1 MG tablet, Take 1 tablet (0.1 mg total) by mouth 2 (two) times daily., Disp: 60 tablet, Rfl: 5 .  irbesartan (AVAPRO) 300 MG tablet, Take 1 tablet (300 mg total) by mouth daily., Disp: 90 tablet, Rfl: 1 .  labetalol (NORMODYNE) 100 MG tablet, Take 1 tablet (100 mg total) by mouth 2 (two) times daily., Disp: 180 tablet, Rfl: 1 .  levothyroxine (SYNTHROID, LEVOTHROID) 75 MCG tablet, Take 1 tablet (75 mcg total) by mouth daily before breakfast., Disp: 30 tablet, Rfl: 6 .  nitroGLYCERIN (NITROSTAT) 0.4  MG SL tablet, Place 1 tablet (0.4 mg total) under the tongue every 5 (five) minutes as needed for chest pain., Disp: 20 tablet, Rfl: 12 .  ticagrelor (BRILINTA) 90 MG TABS tablet, Take 1 tablet (90 mg total) by mouth 2 (two) times daily., Disp: 60 tablet, Rfl: 6  Allergies  Allergen Reactions  . Ace Inhibitors Cough  . Ciprofloxacin Other (See Comments)  . Hydrocodone-Acetaminophen Other (See Comments)  . Norvasc [Amlodipine Besylate]     Pruritus   . Pantoprazole Sodium Other (See Comments)    Cramps  . Pravastatin Other (See Comments)    Stomach cramps  . Sulfa Antibiotics      ROS  Constitutional: Negative for fever , positive for weight change.  Respiratory: Negative for cough and shortness of breath.   Cardiovascular: Negative for chest pain or palpitations.  Gastrointestinal: Negative for abdominal pain, no bowel changes.  Musculoskeletal: Negative for gait problem or joint swelling.  Skin: Negative for rash.  Neurological: Negative for dizziness or headache.  No other specific complaints in a complete review of systems (except as listed in HPI above).  Objective   Vitals:   03/21/17 1419  BP: (!) 122/54  Pulse: 68  Resp: 16  Temp: 97.9 F (36.6 C)  TempSrc: Oral  SpO2: 98%  Weight: 189 lb 9 oz (86 kg)  Height: 5' 4.5" (1.638 m)    Body mass index is 32.04 kg/m.  Physical Exam  Constitutional: Patient appears well-developed and well-nourished. Obese  No distress.  HEENT: head atraumatic, normocephalic, pupils equal and reactive to light, neck supple, throat within normal limits Cardiovascular: Normal rate, regular rhythm and normal heart sounds.  No murmur heard. No BLE edema. Pulmonary/Chest: Effort normal and breath sounds normal. No respiratory distress. Abdominal: Soft.  There is no tenderness. Psychiatric: Patient has a normal mood and affect. behavior is normal. Judgment and thought content normal.  Recent Results (from the past 2160 hour(s))   Glucose, capillary     Status: None   Collection Time: 01/06/17  8:39 AM  Result Value Ref Range   Glucose-Capillary 77 65 - 99 mg/dL  Glucose, capillary     Status: None   Collection Time: 01/06/17  8:59 AM  Result Value Ref Range   Glucose-Capillary 96 65 - 99 mg/dL  Basic metabolic panel     Status: Abnormal   Collection Time: 02/23/17  1:58 PM  Result Value Ref Range   Sodium 125 (L) 135 - 145 mmol/L   Potassium 4.2 3.5 - 5.1 mmol/L   Chloride 96 (L) 101 - 111 mmol/L   CO2 19 (L) 22 - 32 mmol/L   Glucose, Bld 90 65 - 99 mg/dL   BUN 22 (H) 6 - 20 mg/dL   Creatinine, Ser 1.53 (H) 0.44 - 1.00 mg/dL   Calcium 9.2 8.9 - 10.3 mg/dL   GFR calc non  Af Amer 33 (L) >60 mL/min   GFR calc Af Amer 39 (L) >60 mL/min    Comment: (NOTE) The eGFR has been calculated using the CKD EPI equation. This calculation has not been validated in all clinical situations. eGFR's persistently <60 mL/min signify possible Chronic Kidney Disease.    Anion gap 10 5 - 15  CBC     Status: Abnormal   Collection Time: 02/23/17  1:58 PM  Result Value Ref Range   WBC 6.7 3.6 - 11.0 K/uL   RBC 3.40 (L) 3.80 - 5.20 MIL/uL   Hemoglobin 10.6 (L) 12.0 - 16.0 g/dL   HCT 31.8 (L) 35.0 - 47.0 %   MCV 93.5 80.0 - 100.0 fL   MCH 31.3 26.0 - 34.0 pg   MCHC 33.4 32.0 - 36.0 g/dL   RDW 14.9 (H) 11.5 - 14.5 %   Platelets 305 150 - 440 K/uL  Troponin I     Status: None   Collection Time: 02/23/17  3:30 PM  Result Value Ref Range   Troponin I <0.03 <0.03 ng/mL  COMPLETE METABOLIC PANEL WITH GFR     Status: Abnormal   Collection Time: 03/21/17  2:25 PM  Result Value Ref Range   Sodium 125 (L) 135 - 146 mmol/L   Potassium 3.9 3.5 - 5.3 mmol/L   Chloride 94 (L) 98 - 110 mmol/L   CO2 22 20 - 31 mmol/L   Glucose, Bld 82 65 - 99 mg/dL   BUN 18 7 - 25 mg/dL   Creat 1.50 (H) 0.60 - 0.93 mg/dL    Comment:   For patients > or = 71 years of age: The upper reference limit for Creatinine is approximately 13% higher for  people identified as African-American.      Total Bilirubin 0.3 0.2 - 1.2 mg/dL   Alkaline Phosphatase 82 33 - 130 U/L   AST 12 10 - 35 U/L   ALT 8 6 - 29 U/L   Total Protein 6.0 (L) 6.1 - 8.1 g/dL   Albumin 3.4 (L) 3.6 - 5.1 g/dL   Calcium 8.7 8.6 - 10.4 mg/dL   GFR, Est African American 40 (L) >=60 mL/min   GFR, Est Non African American 35 (L) >=60 mL/min  TSH     Status: Abnormal   Collection Time: 03/21/17  2:25 PM  Result Value Ref Range   TSH 0.18 (L) mIU/L    Comment:   Reference Range   > or = 20 Years  0.40-4.50   Pregnancy Range First trimester  0.26-2.66 Second trimester 0.55-2.73 Third trimester  0.43-2.91        PHQ2/9: Depression screen [facility] 2/9 03/21/2017 12/06/2016 06/15/2016 11/21/2015 10/08/2015  Decreased Interest 0 0 0 0 0  Down, Depressed, Hopeless 0 0 0 0 0  PHQ - 2 Score 0 0 0 0 0  Altered sleeping - 0 - - -  Tired, decreased energy - 1 - - -  Change in appetite - 0 - - -  Feeling bad or failure about yourself  - 0 - - -  Trouble concentrating - 0 - - -  Moving slowly or fidgety/restless - 0 - - -  Suicidal thoughts - 0 - - -  PHQ-9 Score - 1 - - -  Difficult doing work/chores - Not difficult at all - - -     Fall Risk: Fall Risk  03/21/2017 12/06/2016 06/15/2016 11/21/2015 10/08/2015  Falls in the past year? No No No No No  Assessment & Plan  1. History of non-ST elevation myocardial infarction (NSTEMI)  On beta-blocker, ARB, Brillinta, NTG, aspirin   2. Hypertension, benign  - COMPLETE METABOLIC PANEL WITH GFR  3. Major depression in remission [facility])  She is off SSRI's because of severe hyponatremia, getting evaluated by Dr. Abigail Butts, we will recheck labs today  4. Hypothyroidism, postsurgical  - TSH  5. AAA (abdominal aortic aneurysm) without rupture ([facility])  Unable to tolerate medication   6. CKD (chronic kidney disease), stage III  - COMPLETE METABOLIC PANEL WITH GFR  7. Hyponatremia  Recheck labs  8. Anemia of chronic  disease  Stable, based on labs done at hospital   9. Arthralgia, unspecified joint  Seeing Dr. Jefm Bryant, possible inflammatory arthritis, on prednisone for the past 3 weeks, and is feeling a little better

## 2017-03-21 NOTE — Progress Notes (Signed)
Subjective:   Betty Nielsen is a 71 y.o. female who presents for an Initial Medicare Annual Wellness Visit.  Review of Systems    N/A  Cardiac Risk Factors include: advanced age (>40men, >58 women);dyslipidemia;hypertension;obesity (BMI >30kg/m2)     Objective:    Today's Vitals   03/21/17 1303  BP: (!) 122/54  Pulse: 68  Temp: 97.9 F (36.6 C)  TempSrc: Oral  Weight: 189 lb 14.4 oz (86.1 kg)  Height: 5\' 5"  (1.651 m)  PainSc: 0-No pain   Body mass index is 31.6 kg/m.   Current Medications (verified) Outpatient Encounter Prescriptions as of 03/21/2017  Medication Sig  . aspirin 81 MG tablet Take 81 mg by mouth daily.  . cloNIDine (CATAPRES) 0.1 MG tablet Take 1 tablet (0.1 mg total) by mouth 2 (two) times daily.  . irbesartan (AVAPRO) 300 MG tablet Take 1 tablet (300 mg total) by mouth daily.  Marland Kitchen labetalol (NORMODYNE) 100 MG tablet Take 1 tablet (100 mg total) by mouth 2 (two) times daily.  Marland Kitchen levothyroxine (SYNTHROID, LEVOTHROID) 75 MCG tablet Take 1 tablet (75 mcg total) by mouth daily before breakfast.  . nitroGLYCERIN (NITROSTAT) 0.4 MG SL tablet Place 1 tablet (0.4 mg total) under the tongue every 5 (five) minutes as needed for chest pain.  . ticagrelor (BRILINTA) 90 MG TABS tablet Take 1 tablet (90 mg total) by mouth 2 (two) times daily.  . [DISCONTINUED] acetaminophen (TYLENOL ARTHRITIS PAIN) 650 MG CR tablet Take 650 mg by mouth every 8 (eight) hours as needed for pain.  . [DISCONTINUED] cholecalciferol (VITAMIN D) 1000 units tablet Take 1,000 Units by mouth daily.  . [DISCONTINUED] citalopram (CELEXA) 10 MG tablet Take 1 tablet (10 mg total) by mouth daily.  . [DISCONTINUED] meclizine (ANTIVERT) 12.5 MG tablet Take 1 tablet (12.5 mg total) by mouth 3 (three) times daily as needed for dizziness.  . [DISCONTINUED] Omega-3 Fatty Acids (FISH OIL PO) Take 1 capsule by mouth daily.  . [DISCONTINUED] pantoprazole (PROTONIX) 40 MG tablet Take 1 tablet (40 mg total) by mouth  daily.   No facility-administered encounter medications on file as of 03/21/2017.     Allergies (verified) Ace inhibitors; Ciprofloxacin; Hydrocodone-acetaminophen; Norvasc [amlodipine besylate]; Pantoprazole sodium; Pravastatin; and Sulfa antibiotics   History: Past Medical History:  Diagnosis Date  . Arthrosis of knee   . Chronic kidney disease   . Hyperlipidemia   . Hypertension   . Loss of hearing   . Metabolic syndrome   . Plantar fasciitis    Past Surgical History:  Procedure Laterality Date  . ABDOMINAL AORTIC ANEURYSM REPAIR    . ABDOMINAL HYSTERECTOMY    . APPENDECTOMY    . CORONARY STENT INTERVENTION N/A 10/26/2016   Procedure: Coronary Stent Intervention;  Surgeon: Yolonda Kida, MD;  Location: [place] [facility];  Service: Cardiovascular;  Laterality: N/A;  . HERNIA REPAIR    . INNER EAR SURGERY     pt not sure of type  . LEFT HEART CATH AND CORONARY ANGIOGRAPHY N/A 10/26/2016   Procedure: Left Heart Cath and Coronary Angiography;  Surgeon: Teodoro Spray, MD;  Location: [place] [facility];  Service: Cardiovascular;  Laterality: N/A;  . THYROIDECTOMY     Family History  Problem Relation Age of Onset  . Cerebral aneurysm Father   . Hypertension Sister    Social History   Occupational History  . Not on file.   Social History Main Topics  . Smoking status: Former Smoker  Types: Cigarettes  . Smokeless tobacco: Never Used  . Alcohol use No  . Drug use: No  . Sexual activity: Not Currently    Tobacco Counseling Counseling given: No   Activities of Daily Living In your present state of health, do you have any difficulty performing the following activities: 03/21/2017 11/10/2016  Hearing? Betty Nielsen  Vision? N N  Difficulty concentrating or making decisions? N N  Walking or climbing stairs? N Y  Dressing or bathing? N N  Doing errands, shopping? N N  Preparing Food and eating ? N -  Using the Toilet? N -  In the past six months, have you accidently  leaked urine? Y -  Do you have problems with loss of bowel control? N -  Managing your Medications? N -  Managing your Finances? N -  Housekeeping or managing your Housekeeping? N -  Some recent data might be hidden    Immunizations and Health Maintenance Immunization History  Administered Date(s) Administered  . Influenza, High Dose Seasonal PF 06/05/2015  . Influenza-Unspecified 06/29/2016  . Pneumococcal Conjugate-13 07/17/2014  . Pneumococcal Polysaccharide-23 11/24/2015   There are no preventive care reminders to display for this patient.  Patient Care Team: Steele Sizer, MD as PCP - General (Family Medicine) Lavonia Dana, MD as Consulting Physician (Internal Medicine) Yolonda Kida, MD as Consulting Physician (Cardiology) Emmaline Kluver., MD as Consulting Physician (Rheumatology)  Indicate any recent Medical Services you may have received from other than [facility] providers in the past year (date may be approximate).     Assessment:   This is a routine wellness examination for Betty Nielsen.   Hearing/Vision screen Vision Screening Comments: Pt does not have regular vision checks. Last visits was > 3 years ago.  Dietary issues and exercise activities discussed: Current Exercise Habits: Structured exercise class, Type of exercise: treadmill;walking;Other - see comments (bicycle and aerobics), Time (Minutes): > 60 (1.5 hr), Frequency (Times/Week): 2, Weekly Exercise (Minutes/Week): 0, Intensity: Mild, Exercise limited by: None identified  Goals    . Reduce sodium intake          Recommend to continue decreasing sodium intake in daily diet.      Depression Screen PHQ 2/9 Scores 03/21/2017 12/06/2016 06/15/2016 11/21/2015 10/08/2015 06/05/2015  PHQ - 2 Score 0 0 0 0 0 0  PHQ- 9 Score - 1 - - - -    Fall Risk Fall Risk  03/21/2017 12/06/2016 06/15/2016 11/21/2015 10/08/2015  Falls in the past year? No No No No No    Cognitive Function:        Screening Tests Health  Maintenance  Topic Date Due  . TETANUS/TDAP  03/24/2017 (Originally 05/28/1965)  . DEXA SCAN  02/18/2018 (Originally 05/29/2011)  . MAMMOGRAM  03/20/2018 (Originally 05/28/1996)  . COLONOSCOPY  06/27/2019 (Originally 05/28/1996)  . INFLUENZA VACCINE  04/20/2017  . Hepatitis C Screening  Completed  . PNA vac Low Risk Adult  Completed      Plan:  I have personally reviewed and addressed the Medicare Annual Wellness questionnaire and have noted the following in the patient's chart:  A. Medical and social history B. Use of alcohol, tobacco or illicit drugs  C. Current medications and supplements D. Functional ability and status E.  Nutritional status F.  Physical activity G. Advance directives H. List of other physicians I.  Hospitalizations, surgeries, and ER visits in previous 12 months J.  [place] such as hearing and vision if needed, cognitive and depression L.  Referrals and appointments - none  In addition, I have reviewed and discussed with patient certain preventive protocols, quality metrics, and best practice recommendations. A written personalized care plan for preventive services as well as general preventive health recommendations were provided to patient.  See attached scanned questionnaire for additional information.   Signed,  Fabio Neighbors, LPN Nurse Health Advisor   MD Recommendations: None, pt declined colonoscopy, mammogram and DEXA scan referral today.   I have reviewed this encounter including the documentation in this note and/or discussed this patient with the provider, Fabio Neighbors, LPN. I am certifying that I agree with the content of this note as supervising physician.  Steele Sizer, MD [facility] 03/21/2017, 5:03 PM

## 2017-03-22 ENCOUNTER — Encounter: Payer: Self-pay | Admitting: Family Medicine

## 2017-03-22 ENCOUNTER — Other Ambulatory Visit: Payer: Self-pay | Admitting: Family Medicine

## 2017-03-22 DIAGNOSIS — E7801 Familial hypercholesterolemia: Secondary | ICD-10-CM

## 2017-03-22 DIAGNOSIS — R7989 Other specified abnormal findings of blood chemistry: Secondary | ICD-10-CM

## 2017-03-22 DIAGNOSIS — D649 Anemia, unspecified: Secondary | ICD-10-CM

## 2017-03-22 DIAGNOSIS — E78019 Familial hypercholesterolemia, unspecified: Secondary | ICD-10-CM

## 2017-06-11 LAB — LIPID PANEL: CHOLESTEROL: 325 — AB (ref 0–200)

## 2017-06-11 LAB — BASIC METABOLIC PANEL: Glucose: 118

## 2017-06-15 ENCOUNTER — Ambulatory Visit (INDEPENDENT_AMBULATORY_CARE_PROVIDER_SITE_OTHER): Payer: Medicare Other | Admitting: Family Medicine

## 2017-06-15 ENCOUNTER — Encounter: Payer: Self-pay | Admitting: Family Medicine

## 2017-06-15 VITALS — BP 110/50 | HR 67 | Resp 12 | Wt 191.9 lb

## 2017-06-15 DIAGNOSIS — I10 Essential (primary) hypertension: Secondary | ICD-10-CM | POA: Diagnosis not present

## 2017-06-15 DIAGNOSIS — I714 Abdominal aortic aneurysm, without rupture, unspecified: Secondary | ICD-10-CM

## 2017-06-15 DIAGNOSIS — E890 Postprocedural hypothyroidism: Secondary | ICD-10-CM | POA: Diagnosis not present

## 2017-06-15 DIAGNOSIS — Z9861 Coronary angioplasty status: Secondary | ICD-10-CM

## 2017-06-15 DIAGNOSIS — N183 Chronic kidney disease, stage 3 unspecified: Secondary | ICD-10-CM

## 2017-06-15 DIAGNOSIS — K219 Gastro-esophageal reflux disease without esophagitis: Secondary | ICD-10-CM

## 2017-06-15 DIAGNOSIS — M353 Polymyalgia rheumatica: Secondary | ICD-10-CM

## 2017-06-15 DIAGNOSIS — I700 Atherosclerosis of aorta: Secondary | ICD-10-CM

## 2017-06-15 DIAGNOSIS — E871 Hypo-osmolality and hyponatremia: Secondary | ICD-10-CM

## 2017-06-15 DIAGNOSIS — F325 Major depressive disorder, single episode, in full remission: Secondary | ICD-10-CM

## 2017-06-15 DIAGNOSIS — I209 Angina pectoris, unspecified: Secondary | ICD-10-CM

## 2017-06-15 DIAGNOSIS — E441 Mild protein-calorie malnutrition: Secondary | ICD-10-CM

## 2017-06-15 MED ORDER — LEVOTHYROXINE SODIUM 75 MCG PO TABS: 75.0000 ug | ORAL_TABLET | Freq: Every day | ORAL | 6 refills | Status: DC

## 2017-06-15 NOTE — Progress Notes (Signed)
Name: ASHTIN MELICHAR   MRN: [id number]    DOB: 1945/12/02   Date:06/15/2017       Progress Note  Subjective  Chief Complaint  Chief Complaint  Patient presents with  . Medication Refill  . Hypertension    HPI  Hyponatremia: she had a syncopal episode 02/23/2017, she was found to have high bp and low sodium. She has been seeing Dr. Abigail Butts, off SSRI we will recheck levels.   CKI III/ Hyponatremia: we will recheck labs and send results to Dr. Abigail Butts.   HTN/CAD/Angina: doing well bp is at goal, she states she has decrease in exercise tolerance. She has not used any NTG since Feb 2018 ( when she had MI). She had cardiac rehab  PMR: seeing Dr. Jefm Bryant, tender joints and muscles all over, started on low dose prednisone and is doing better, still has pain, but mild and able to walk   Atherosclerosis abdominal aorta/hyperlipidemia: unable to tolerate statin, currently on 81 mg aspirin and plavix, she denies easy bleeding.   Hyperlipidemia: LDL dangerously high, but patient is not willing to try statin, too scared of side effects, she has a history of MI and still not willing. . She also has atherosclerosis of abdominal aorta, discussed risk of strokes and heart attacks, explained we have other agents besides statins to improve LLD , but she refuses therapy  Hypothyroidism: she is s/p thyroidectomy was having labs done by Dr. Pryor Ochoa, Compliant with medication, denies dry skin, states constipation has been controlled with Miralax. Last TSH was not at goal, she is taking Synthroid 75 mcg daily and we will recheck labs today  GERD: under control at this time, no heartburn or regurgitation. Taking Tums prn for bloating caused by drinking a lot ot tea, advised to stop drinking tea and coffee   Depression: she is off SSRI because of hyponatremia, but states she is feeling well, in remission. 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 She denies sadness, crying spells, change in appetite,  or suicidal thoughts.    Patient Active Problem List   Diagnosis Date Noted  . PMR (polymyalgia rheumatica) ([place]) 06/15/2017  . Elevated troponin 10/27/2016  . Essential hypertension, malignant 10/27/2016  . Headache 10/27/2016  . CKD (chronic kidney disease), stage III 10/27/2016  . Hyperlipidemia 10/27/2016  . Hyponatremia 10/27/2016  . Angina pectoris ([place]) 10/25/2016  . History of non-ST elevation myocardial infarction (NSTEMI) 10/25/2016  . History of coronary angioplasty 10/25/2016  . BPV (benign positional vertigo) 06/15/2016  . 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[place]) 11/21/2015  . Anemia of chronic disease 06/05/2015  . Acid reflux 06/05/2015  . Hypertension, benign 06/05/2015  . Hypothyroidism, 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  . Major depression in remission ([place]) 06/05/2015  . Obesity (BMI 30.0-34.9) 06/05/2015  . Neuropathy 06/05/2015  . Arthritis, degenerative 07/03/2014  . AAA (abdominal aortic aneurysm) without rupture ([place]) 06/26/2014    Past Surgical History:  Procedure Laterality Date  . ABDOMINAL AORTIC ANEURYSM REPAIR    . ABDOMINAL HYSTERECTOMY    . APPENDECTOMY    . CORONARY STENT INTERVENTION N/A 10/26/2016   Procedure: Coronary Stent Intervention;  Surgeon: Yolonda Kida, MD;  Location: [facility];  Service: Cardiovascular;  Laterality: N/A;  . HERNIA REPAIR    . INNER EAR SURGERY     pt not sure of type  . LEFT HEART CATH AND CORONARY ANGIOGRAPHY N/A 10/26/2016   Procedure: Left Heart Cath and Coronary Angiography;  Surgeon: Teodoro Spray, MD;  Location:  [facility];  Service: Cardiovascular;  Laterality: N/A;  . THYROIDECTOMY      Family History  Problem Relation Age of Onset  . Cerebral aneurysm Father   . Hypertension Sister     Social History   Social History  . Marital status: Widowed    Spouse name: N/A  . Number of children: N/A  . Years of education: N/A   Occupational History  . Not on file.   Social History  Main Topics  . Smoking status: Former Smoker    Types: Cigarettes  . Smokeless tobacco: Never Used  . Alcohol use No  . Drug use: No  . Sexual activity: Not Currently   Other Topics Concern  . Not on file   Social History Narrative  . No narrative on file     Current Outpatient Prescriptions:  .  aspirin 81 MG tablet, Take 81 mg by mouth daily., Disp: , Rfl:  .  cloNIDine (CATAPRES) 0.1 MG tablet, Take 1 tablet (0.1 mg total) by mouth 2 (two) times daily., Disp: 60 tablet, Rfl: 5 .  clopidogrel (PLAVIX) 75 MG tablet, Take by mouth., Disp: , Rfl:  .  irbesartan (AVAPRO) 300 MG tablet, Take 1 tablet (300 mg total) by mouth daily., Disp: 90 tablet, Rfl: 1 .  labetalol (NORMODYNE) 100 MG tablet, Take 1 tablet (100 mg total) by mouth 2 (two) times daily., Disp: 180 tablet, Rfl: 1 .  nitroGLYCERIN (NITROSTAT) 0.4 MG SL tablet, Place 1 tablet (0.4 mg total) under the tongue every 5 (five) minutes as needed for chest pain., Disp: 20 tablet, Rfl: 12 .  predniSONE (DELTASONE) 5 MG tablet, Take 1 tablet by mouth 2 (two) times daily., Disp: , Rfl:  .  levothyroxine (SYNTHROID, LEVOTHROID) 75 MCG tablet, Take 1 tablet (75 mcg total) by mouth daily before breakfast., Disp: 30 tablet, Rfl: 6  Allergies  Allergen Reactions  . Ace Inhibitors Cough  . Ciprofloxacin Other (See Comments)  . Hydrocodone-Acetaminophen Other (See Comments)  . Norvasc [Amlodipine Besylate]     Pruritus   . Pantoprazole Sodium Other (See Comments)    Cramps  . Pravastatin Other (See Comments)    Stomach cramps  . Sulfa Antibiotics      ROS  Constitutional: Negative for fever or weight change.  Respiratory: Negative for cough and shortness of breath.   Cardiovascular: Negative for chest pain or palpitations.  Gastrointestinal: Negative for abdominal pain, no bowel changes.  Musculoskeletal: Negative for gait problem or joint swelling.  Skin: Negative for rash.  Neurological: Negative for dizziness or  headache.  No other specific complaints in a complete review of systems (except as listed in HPI above).  Objective  Vitals:   06/15/17 1433  BP: (!) 110/50  Pulse: 67  Resp: 12  SpO2: 98%  Weight: 191 lb 14.4 oz (87 kg)    Body mass index is 32.43 kg/m.  Physical Exam  Constitutional: Patient appears well-developed and well-nourished. Obese No distress.  HEENT: head atraumatic, normocephalic, pupils equal and reactive to light, neck supple, throat within normal limits Cardiovascular: Normal rate, regular rhythm and normal heart sounds.  No murmur heard. No BLE edema. Pulmonary/Chest: Effort normal and breath sounds normal. No respiratory distress. Abdominal: Soft.  There is no tenderness. Psychiatric: Patient has a normal mood and affect. behavior is normal. Judgment and thought content normal. Muscular Skeletal: no synovitis crepitus with extension of left knee, normal gait   Recent Results (from the past 2160 hour(s))  COMPLETE  METABOLIC PANEL WITH GFR     Status: Abnormal   Collection Time: 03/21/17  2:25 PM  Result Value Ref Range   Sodium 125 (L) 135 - 146 mmol/L   Potassium 3.9 3.5 - 5.3 mmol/L   Chloride 94 (L) 98 - 110 mmol/L   CO2 22 20 - 31 mmol/L   Glucose, Bld 82 65 - 99 mg/dL   BUN 18 7 - 25 mg/dL   Creat 1.50 (H) 0.60 - 0.93 mg/dL    Comment:   For patients > or = 71 years of age: The upper reference limit for Creatinine is approximately 13% higher for people identified as African-American.      Total Bilirubin 0.3 0.2 - 1.2 mg/dL   Alkaline Phosphatase 82 33 - 130 U/L   AST 12 10 - 35 U/L   ALT 8 6 - 29 U/L   Total Protein 6.0 (L) 6.1 - 8.1 g/dL   Albumin 3.4 (L) 3.6 - 5.1 g/dL   Calcium 8.7 8.6 - 10.4 mg/dL   GFR, Est African American 40 (L) >=60 mL/min   GFR, Est Non African American 35 (L) >=60 mL/min  TSH     Status: Abnormal   Collection Time: 03/21/17  2:25 PM  Result Value Ref Range   TSH 0.18 (L) mIU/L    Comment:   Reference Range    > or = 20 Years  0.40-4.50   Pregnancy Range First trimester  0.26-2.66 Second trimester 0.55-2.73 Third trimester  0.43-2.91        PHQ2/9: Depression screen [facility] 2/9 03/21/2017 12/06/2016 06/15/2016 11/21/2015 10/08/2015  Decreased Interest 0 0 0 0 0  Down, Depressed, Hopeless 0 0 0 0 0  PHQ - 2 Score 0 0 0 0 0  Altered sleeping - 0 - - -  Tired, decreased energy - 1 - - -  Change in appetite - 0 - - -  Feeling bad or failure about yourself  - 0 - - -  Trouble concentrating - 0 - - -  Moving slowly or fidgety/restless - 0 - - -  Suicidal thoughts - 0 - - -  PHQ-9 Score - 1 - - -  Difficult doing work/chores - Not difficult at all - - -     Fall Risk: Fall Risk  06/15/2017 03/21/2017 12/06/2016 06/15/2016 11/21/2015  Falls in the past year? No No No No No     Assessment & Plan  1. Hypothyroidism, postsurgical  Last TSH was suppressed  - levothyroxine (SYNTHROID, LEVOTHROID) 75 MCG tablet; Take 1 tablet (75 mcg total) by mouth daily before breakfast.  Dispense: 30 tablet; Refill: 6 - TSH  2. Hypertension, benign  bp is low, but she states bp is not low at home, 130's and denies orthostatic changes, advised to monitor and call back if it remains low  - CBC with Differential/Platelet  3. Major depression in remission ([place])  Off SSRI because of hyponatremia  4. AAA (abdominal aortic aneurysm) without rupture ([place])  Cannot take statin therapy   5. CKD (chronic kidney disease), stage III  - COMPLETE METABOLIC PANEL WITH GFR  6. Hyponatremia  - COMPLETE METABOLIC PANEL WITH GFR  7. 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[facility])  Not on statin on Plavix  8. Gastroesophageal reflux disease without esophagitis  Off PPI and states doing well at this time  9. History of coronary angioplasty   10/2016  10. Angina pectoris ([place])  Doing well at this time, sees Dr. Clayborn Bigness  11. PMR (polymyalgia rheumatica) ([facility])  Seeing Dr. Jefm Bryant. Taking Prednisone and Tylenol     12. Mild malnutrition ([facility])  Low albumin, we will recheck labs

## 2017-06-16 ENCOUNTER — Encounter: Payer: Self-pay | Admitting: Family Medicine

## 2017-06-16 ENCOUNTER — Other Ambulatory Visit: Payer: Self-pay | Admitting: Family Medicine

## 2017-06-16 DIAGNOSIS — N183 Chronic kidney disease, stage 3 unspecified: Secondary | ICD-10-CM

## 2017-06-16 DIAGNOSIS — E441 Mild protein-calorie malnutrition: Secondary | ICD-10-CM | POA: Insufficient documentation

## 2017-06-16 DIAGNOSIS — E871 Hypo-osmolality and hyponatremia: Secondary | ICD-10-CM

## 2017-06-16 DIAGNOSIS — E785 Hyperlipidemia, unspecified: Secondary | ICD-10-CM

## 2017-06-16 LAB — CBC WITH DIFFERENTIAL/PLATELET
Basophils Absolute: 39 cells/uL (ref 0–200)
Basophils Relative: 0.6 %
EOS PCT: 2.8 %
Eosinophils Absolute: 182 cells/uL (ref 15–500)
HCT: 28.7 % — ABNORMAL LOW (ref 35.0–45.0)
Hemoglobin: 9.5 g/dL — ABNORMAL LOW (ref 11.7–15.5)
Lymphs Abs: 2529 cells/uL (ref 850–3900)
MCH: 30.7 pg (ref 27.0–33.0)
MCHC: 33.1 g/dL (ref 32.0–36.0)
MCV: 92.9 fL (ref 80.0–100.0)
MPV: 8.8 fL (ref 7.5–12.5)
Monocytes Relative: 11.1 %
NEUTROS PCT: 46.6 %
Neutro Abs: 3029 cells/uL (ref 1500–7800)
Platelets: 263 10*3/uL (ref 140–400)
RBC: 3.09 10*6/uL — AB (ref 3.80–5.10)
RDW: 12.9 % (ref 11.0–15.0)
TOTAL LYMPHOCYTE: 38.9 %
WBC mixed population: 722 cells/uL (ref 200–950)
WBC: 6.5 10*3/uL (ref 3.8–10.8)

## 2017-06-16 LAB — COMPLETE METABOLIC PANEL WITH GFR
AG Ratio: 1.4 (calc) (ref 1.0–2.5)
ALKALINE PHOSPHATASE (APISO): 81 U/L (ref 33–130)
ALT: 6 U/L (ref 6–29)
AST: 13 U/L (ref 10–35)
Albumin: 3.4 g/dL — ABNORMAL LOW (ref 3.6–5.1)
BILIRUBIN TOTAL: 0.4 mg/dL (ref 0.2–1.2)
BUN / CREAT RATIO: 9 (calc) (ref 6–22)
BUN: 12 mg/dL (ref 7–25)
CHLORIDE: 100 mmol/L (ref 98–110)
CO2: 23 mmol/L (ref 20–32)
CREATININE: 1.32 mg/dL — AB (ref 0.60–0.93)
Calcium: 8.8 mg/dL (ref 8.6–10.4)
GFR, Est African American: 47 mL/min/{1.73_m2} — ABNORMAL LOW (ref >=60)
GFR, Est Non African American: 40 mL/min/{1.73_m2} — ABNORMAL LOW (ref >=60)
GLUCOSE: 92 mg/dL (ref 65–99)
Globulin: 2.4 g/dL (calc) (ref 1.9–3.7)
Potassium: 3.7 mmol/L (ref 3.5–5.3)
Sodium: 133 mmol/L — ABNORMAL LOW (ref 135–146)
Total Protein: 5.8 g/dL — ABNORMAL LOW (ref 6.1–8.1)

## 2017-06-16 LAB — TSH: TSH: 0.58 mIU/L (ref 0.40–4.50)

## 2017-06-24 ENCOUNTER — Encounter: Payer: Self-pay | Admitting: Family Medicine

## 2017-09-27 ENCOUNTER — Encounter: Payer: Self-pay | Admitting: Family Medicine

## 2017-10-24 ENCOUNTER — Emergency Department
Admission: EM | Admit: 2017-10-24 | Discharge: 2017-10-24 | Disposition: A | Discharge status: Home / Self Care | Payer: Medicare Other | Attending: Emergency Medicine | Admitting: Emergency Medicine

## 2017-10-24 ENCOUNTER — Emergency Department: Payer: Medicare Other

## 2017-10-24 ENCOUNTER — Encounter: Payer: Self-pay | Admitting: Intensive Care

## 2017-10-24 DIAGNOSIS — R079 Chest pain, unspecified: Secondary | ICD-10-CM | POA: Diagnosis not present

## 2017-10-24 DIAGNOSIS — R42 Dizziness and giddiness: Secondary | ICD-10-CM | POA: Diagnosis not present

## 2017-10-24 DIAGNOSIS — R51 Headache: Secondary | ICD-10-CM | POA: Diagnosis present

## 2017-10-24 DIAGNOSIS — Z7982 Long term (current) use of aspirin: Secondary | ICD-10-CM | POA: Diagnosis not present

## 2017-10-24 DIAGNOSIS — R0602 Shortness of breath: Secondary | ICD-10-CM | POA: Insufficient documentation

## 2017-10-24 DIAGNOSIS — Z7902 Long term (current) use of antithrombotics/antiplatelets: Secondary | ICD-10-CM | POA: Insufficient documentation

## 2017-10-24 DIAGNOSIS — M25512 Pain in left shoulder: Secondary | ICD-10-CM | POA: Diagnosis not present

## 2017-10-24 DIAGNOSIS — E039 Hypothyroidism, unspecified: Secondary | ICD-10-CM | POA: Insufficient documentation

## 2017-10-24 DIAGNOSIS — Z882 Allergy status to sulfonamides status: Secondary | ICD-10-CM | POA: Insufficient documentation

## 2017-10-24 DIAGNOSIS — I129 Hypertensive chronic kidney disease with stage 1 through stage 4 chronic kidney disease, or unspecified chronic kidney disease: Secondary | ICD-10-CM | POA: Insufficient documentation

## 2017-10-24 DIAGNOSIS — Z87891 Personal history of nicotine dependence: Secondary | ICD-10-CM | POA: Insufficient documentation

## 2017-10-24 DIAGNOSIS — I252 Old myocardial infarction: Secondary | ICD-10-CM | POA: Diagnosis not present

## 2017-10-24 DIAGNOSIS — N183 Chronic kidney disease, stage 3 (moderate): Secondary | ICD-10-CM | POA: Diagnosis not present

## 2017-10-24 DIAGNOSIS — R0981 Nasal congestion: Secondary | ICD-10-CM | POA: Diagnosis not present

## 2017-10-24 LAB — CBC
HCT: 32 % — ABNORMAL LOW (ref 35.0–47.0)
HEMOGLOBIN: 10.6 g/dL — AB (ref 12.0–16.0)
MCH: 30.3 pg (ref 26.0–34.0)
MCHC: 33.2 g/dL (ref 32.0–36.0)
MCV: 91.3 fL (ref 80.0–100.0)
PLATELETS: 312 10*3/uL (ref 150–440)
RBC: 3.5 MIL/uL — AB (ref 3.80–5.20)
RDW: 13.2 % (ref 11.5–14.5)
WBC: 5.2 10*3/uL (ref 3.6–11.0)

## 2017-10-24 LAB — APTT: aPTT: 29 seconds (ref 24–36)

## 2017-10-24 LAB — BASIC METABOLIC PANEL
Anion gap: 8 (ref 5–15)
BUN: 16 mg/dL (ref 6–20)
CALCIUM: 9.2 mg/dL (ref 8.9–10.3)
CO2: 23 mmol/L (ref 22–32)
CREATININE: 1.39 mg/dL — AB (ref 0.44–1.00)
Chloride: 98 mmol/L — ABNORMAL LOW (ref 101–111)
GFR, EST AFRICAN AMERICAN: 43 mL/min — AB (ref >60)
GFR, EST NON AFRICAN AMERICAN: 37 mL/min — AB (ref >60)
Glucose, Bld: 99 mg/dL (ref 65–99)
Potassium: 4.4 mmol/L (ref 3.5–5.1)
SODIUM: 129 mmol/L — AB (ref 135–145)

## 2017-10-24 LAB — PROTIME-INR
INR: 1.02
Prothrombin Time: 13.3 seconds (ref 11.4–15.2)

## 2017-10-24 LAB — TROPONIN I

## 2017-10-24 IMAGING — CR DG CHEST 2V
2 series · 2 of 2 positions shown · non-contrast
Comparison: [DATE]

CLINICAL DATA: Shortness of Breath

EXAM:
CHEST  2 VIEW

[chest pa]
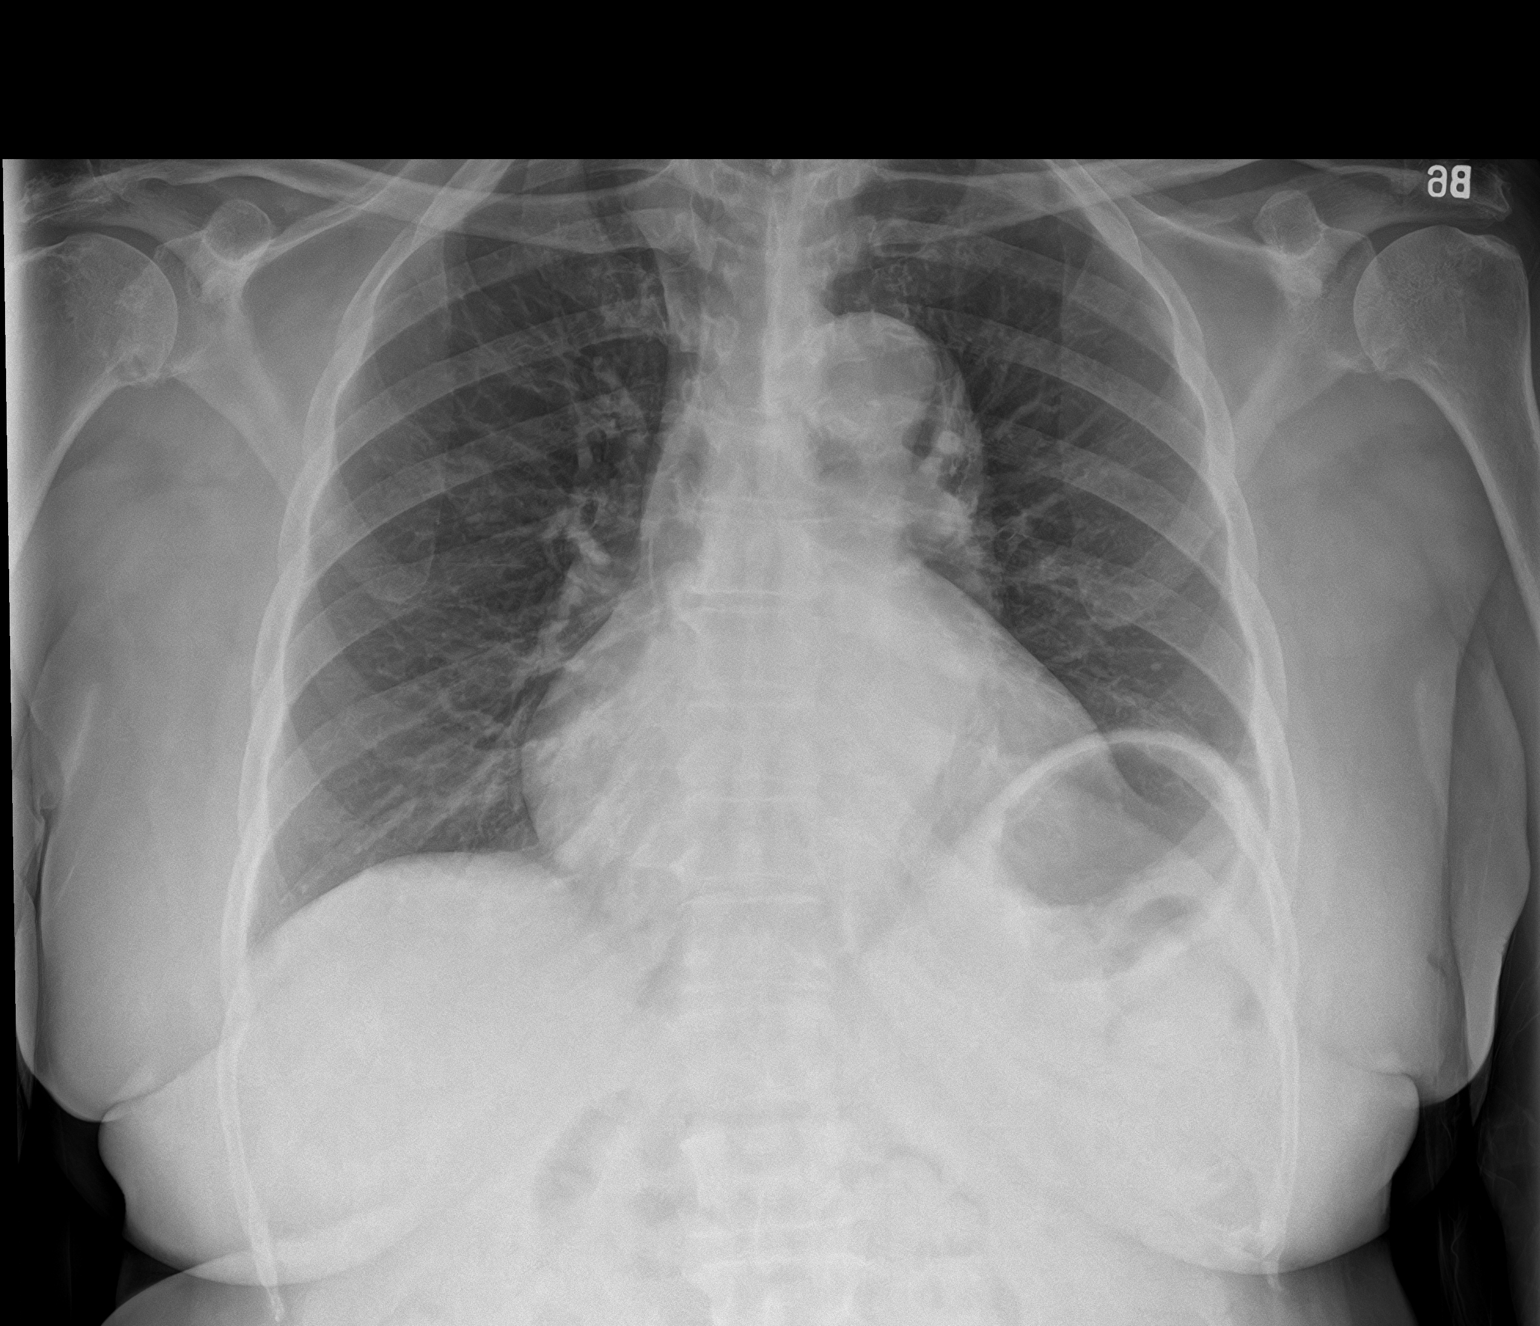

[chest lat]
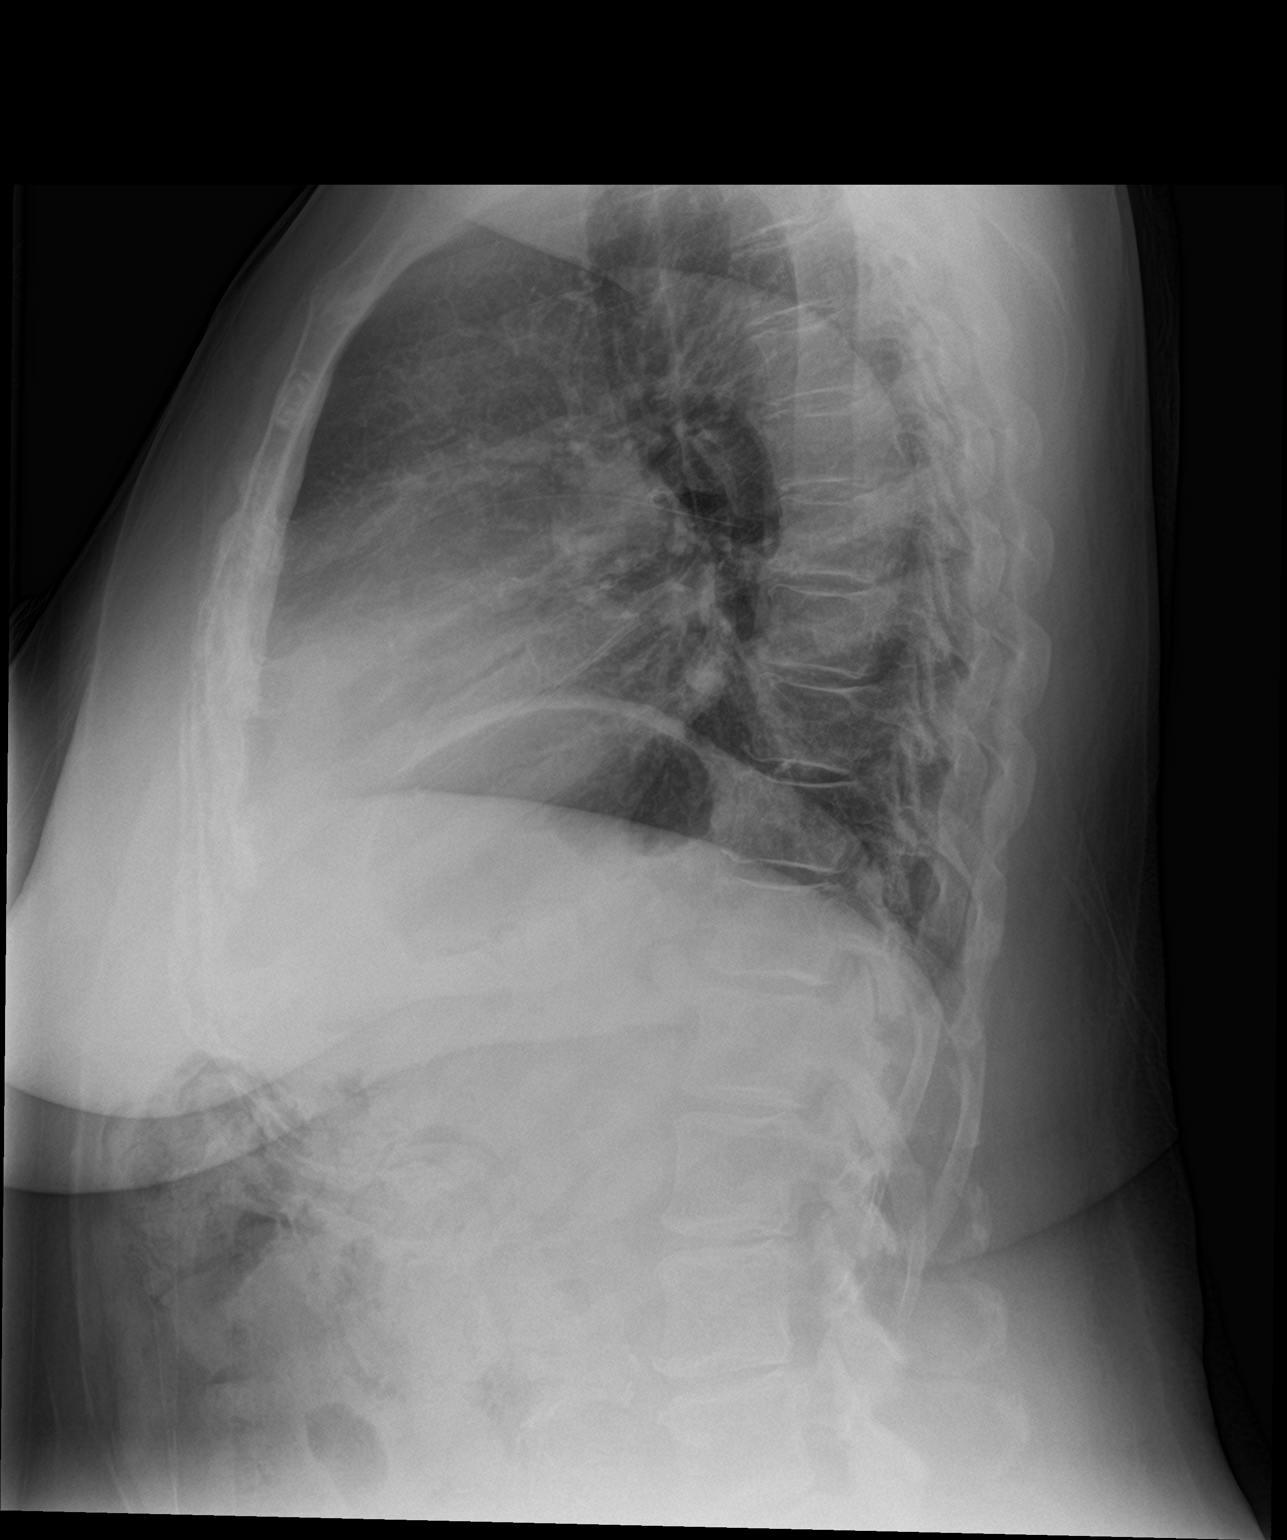

[2 of 2 positions shown; findings below may reference images not displayed]

FINDINGS: There is mild elevation of the left hemidiaphragm, stable. There is
no edema or consolidation. Heart is upper normal in size with
pulmonary vascularity within normal limits. There is aortic
atherosclerosis. No adenopathy. No bone lesions.
IMPRESSION: No edema or consolidation. Stable cardiac silhouette. There is
aortic atherosclerosis. There is stable elevation of the left
hemidiaphragm.

Aortic Atherosclerosis ([4K]-[4K]).

## 2017-10-24 MED ORDER — LABETALOL HCL 100 MG PO TABS
100.0000 mg | ORAL_TABLET | Freq: Once | ORAL | Status: AC
  Administered 2017-10-24: 100 mg via ORAL
  Filled 2017-10-24: qty 1

## 2017-10-24 MED ORDER — CLONIDINE HCL 0.1 MG PO TABS
0.2000 mg | ORAL_TABLET | Freq: Once | ORAL | Status: AC
  Administered 2017-10-24: 0.2 mg via ORAL
  Filled 2017-10-24: qty 2

## 2017-10-24 MED ORDER — IRBESARTAN 150 MG PO TABS
300.0000 mg | ORAL_TABLET | Freq: Every day | ORAL | Status: DC
  Administered 2017-10-24: 300 mg via ORAL
  Filled 2017-10-24: qty 1

## 2017-10-24 NOTE — ED Notes (Signed)
Pt cleared by MD to eat. Pt provided water, peanut butter and graham crackers by RN. Pt notified of plan of care regarding administering home medication to lower BP. Currently waiting on medications from pharmacy, pharmacy contacted by RN, pharmacy states they are sending up the medications.

## 2017-10-24 NOTE — Discharge Instructions (Signed)

## 2017-10-24 NOTE — ED Triage Notes (Addendum)
Patient reports she is having same symptoms as when she had previous MI. States "I feel fluttery in my head with a headache, dizzy when walking, L shoulder/chest pain, and weakness" Patient drove herself to ER today. A&O x4 in triage. Patient takes blood thinners daily

## 2017-10-24 NOTE — ED Provider Notes (Signed)
[facility] Emergency Department Provider Note  ____________________________________________  Time seen: Approximately 6:16 PM  I have reviewed the triage vital signs and the nursing notes.   HISTORY  Chief Complaint Headache and Chest Pain (L shoulder/chest)   HPI CIMBERLY STOFFEL is a 72 y.o. female with history of arthritis, chronic kidney disease, hypertension, hyperlipidemia, CAD status post stent, AAA, and polymyalgia rheumatica who presents for evaluation of left shoulder pain. Patient reports for the weekend and she had a constant sharp pain in the left shoulder radiating to the left upper arm, the pain was mild during the day and moderate during the night. She reports that she felt short of breath associated with this pain. She also reports sinus congestion and had a frontal moderate throbbing headache over the weekend. She was having lightheadedness spells. Yesterday evening she took 2 prednisone and 2 Tylenol and the pain went away completely. No pain today. Patient was concerned because when she had her heart attack back in February she was having similar shoulder pains. So she went to the walk-in clinic today and was sent here for further evaluation. Patient has had no chest pain and her last episode of shoulder pain was more than 24 hours ago. She denies thunderclap headache and her headache has also resolved. No dizzy spells. She feels like she is back to her baseline. No cough, no fever or chills.  Past Medical History:  Diagnosis Date  . Arthrosis of knee   . Chronic kidney disease   . Hyperlipidemia   . Hypertension   . Loss of hearing   . Metabolic syndrome   . Plantar fasciitis     Patient Active Problem List   Diagnosis Date Noted  . Malnutrition of mild degree ([place]) 06/16/2017  . PMR (polymyalgia rheumatica) ([place]) 06/15/2017  . Elevated troponin 10/27/2016  . Essential hypertension, malignant 10/27/2016  . Headache 10/27/2016  . CKD  (chronic kidney disease), stage III ([place]) 10/27/2016  . Hyperlipidemia 10/27/2016  . Hyponatremia 10/27/2016  . Angina pectoris ([place]) 10/25/2016  . History of non-ST elevation myocardial infarction (NSTEMI) 10/25/2016  . History of coronary angioplasty 10/25/2016  . BPV (benign positional vertigo) 06/15/2016  . 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[place]) 11/21/2015  . Anemia of chronic disease 06/05/2015  . Acid reflux 06/05/2015  . Hypertension, benign 06/05/2015  . Hypothyroidism, 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  . Major depression in remission ([place]) 06/05/2015  . Obesity (BMI 30.0-34.9) 06/05/2015  . Neuropathy 06/05/2015  . Arthritis, degenerative 07/03/2014  . AAA (abdominal aortic aneurysm) without rupture ([place]) 06/26/2014    Past Surgical History:  Procedure Laterality Date  . ABDOMINAL AORTIC ANEURYSM REPAIR    . ABDOMINAL HYSTERECTOMY    . APPENDECTOMY    . CORONARY STENT INTERVENTION N/A 10/26/2016   Procedure: Coronary Stent Intervention;  Surgeon: Yolonda Kida, MD;  Location: [facility];  Service: Cardiovascular;  Laterality: N/A;  . HERNIA REPAIR    . INNER EAR SURGERY     pt not sure of type  . LEFT HEART CATH AND CORONARY ANGIOGRAPHY N/A 10/26/2016   Procedure: Left Heart Cath and Coronary Angiography;  Surgeon: Teodoro Spray, MD;  Location: [facility];  Service: Cardiovascular;  Laterality: N/A;  . THYROIDECTOMY      Prior to Admission medications   Medication Sig Start Date End Date Taking? Authorizing Provider  aspirin 81 MG tablet Take 81 mg by mouth daily.    [provider]  cloNIDine (CATAPRES) 0.1  MG tablet Take 1 tablet (0.1 mg total) by mouth 2 (two) times daily. 11/10/16   Steele Sizer, MD  clopidogrel (PLAVIX) 75 MG tablet Take by mouth. 05/04/17 05/04/18  [provider]  irbesartan (AVAPRO) 300 MG tablet Take 1 tablet (300 mg total) by mouth daily. 12/14/16   Steele Sizer, MD  labetalol (NORMODYNE) 100 MG tablet Take  1 tablet (100 mg total) by mouth 2 (two) times daily. 12/14/16   Steele Sizer, MD  levothyroxine (SYNTHROID, LEVOTHROID) 75 MCG tablet Take 1 tablet (75 mcg total) by mouth daily before breakfast. 06/15/17   Ancil Boozer, Drue Stager, MD  nitroGLYCERIN (NITROSTAT) 0.4 MG SL tablet Place 1 tablet (0.4 mg total) under the tongue every 5 (five) minutes as needed for chest pain. 10/27/16   Theodoro Grist, MD  predniSONE (DELTASONE) 5 MG tablet Take 1 tablet by mouth 2 (two) times daily. 05/30/17   Emmaline Kluver., MD    Allergies Ace inhibitors; Ciprofloxacin; Hydrocodone-acetaminophen; Norvasc [amlodipine besylate]; Pantoprazole sodium; Pravastatin; and Sulfa antibiotics  Family History  Problem Relation Age of Onset  . Cerebral aneurysm Father   . Hypertension Sister     Social History Social History   Tobacco Use  . Smoking status: Former Smoker    Types: Cigarettes  . Smokeless tobacco: Never Used  Substance Use Topics  . Alcohol use: No    Alcohol/week: 0.0 oz  . Drug use: No    Review of Systems  Constitutional: Negative for fever. Eyes: Negative for visual changes. ENT: Negative for sore throat. Neck: No neck pain  Cardiovascular: Nochest pain. Respiratory: + shortness of breath. Gastrointestinal: Negative for abdominal pain, vomiting or diarrhea. Genitourinary: Negative for dysuria. Musculoskeletal: Negative for back pain. + L shoulder pain Skin: Negative for rash. Neurological: Negative for  weakness or numbness. + HA Psych: No SI or HI  ____________________________________________   PHYSICAL EXAM:  VITAL SIGNS: ED Triage Vitals [10/24/17 1423]  Enc Vitals Group     BP (!) 167/85     Pulse Rate 70     Resp 18     Temp 97.6 F (36.4 C)     Temp Source Oral     SpO2 98 %     Weight 186 lb (84.4 kg)     Height 5\' 4"  (1.626 m)     Head Circumference      Peak Flow      Pain Score 3     Pain Loc      Pain Edu?      Excl. in [place]?     Constitutional: Alert  and oriented. Well appearing and in no apparent distress. HEENT:      Head: Normocephalic and atraumatic.         Eyes: Conjunctivae are normal. Sclera is non-icteric.       Mouth/Throat: Mucous membranes are moist.       Neck: Supple with no signs of meningismus. Cardiovascular: Regular rate and rhythm. No murmurs, gallops, or rubs. 2+ symmetrical distal pulses are present in all extremities. No JVD. Respiratory: Normal respiratory effort. Lungs are clear to auscultation bilaterally. No wheezes, crackles, or rhonchi.  Gastrointestinal: Soft, non tender, and non distended with positive bowel sounds. No rebound or guarding. Musculoskeletal: Nontender with normal range of motion in all extremities. No edema, cyanosis, or erythema of extremities. Neurologic: Normal speech and language. A & O x3, PERRL, EOMI, no nystagmus, CN II-XII intact, motor testing reveals good tone and bulk throughout. There is no evidence  of pronator drift or dysmetria. Muscle strength is 5/5 throughout. Sensory examination is intact. Gait is normal. Skin: Skin is warm, dry and intact. No rash noted. Psychiatric: Mood and affect are normal. Speech and behavior are normal.  ____________________________________________   LABS (all labs ordered are listed, but only abnormal results are displayed)  Labs Reviewed  BASIC METABOLIC PANEL - Abnormal; Notable for the following components:      Result Value   Sodium 129 (*)    Chloride 98 (*)    Creatinine, Ser 1.39 (*)    GFR calc non Af Amer 37 (*)    GFR calc Af Amer 43 (*)    All other components within normal limits  CBC - Abnormal; Notable for the following components:   RBC 3.50 (*)    Hemoglobin 10.6 (*)    HCT 32.0 (*)    All other components within normal limits  TROPONIN I  PROTIME-INR  APTT   ____________________________________________  EKG  ED ECG REPORT I, Rudene Re, the attending physician, personally viewed and interpreted this  ECG.  Normal sinus rhythm, rate of 64, normal intervals, normal axis, no ST elevations or depressions.unchanged from prior from 02/2017 ____________________________________________  RADIOLOGY  Interpreted by me: CXR: unchanged    Interpretation by Radiologist:  Dg Chest 2 View  Result Date: 10/24/2017 CLINICAL DATA:  Shortness of Breath EXAM: CHEST  2 VIEW COMPARISON:  February 23, 2017 FINDINGS: There is mild elevation of the left hemidiaphragm, stable. There is no edema or consolidation. Heart is upper normal in size with pulmonary vascularity within normal limits. There is aortic atherosclerosis. No adenopathy. No bone lesions. IMPRESSION: No edema or consolidation. Stable cardiac silhouette. There is aortic atherosclerosis. There is stable elevation of the left hemidiaphragm. Aortic Atherosclerosis (ICD10-I70.0). Electronically Signed   By: Lowella Grip III M.D.   On: 10/24/2017 15:39     ____________________________________________   PROCEDURES  Procedure(s) performed: None Procedures Critical Care performed:  None ____________________________________________   INITIAL IMPRESSION / ASSESSMENT AND PLAN / ED COURSE   72 y.o. female with history of arthritis, chronic kidney disease, hypertension, hyperlipidemia, CAD status post stent, AAA, and polymyalgia rheumatica who presents for evaluation of left shoulder pain x 3 days which has resolved 24 hours ago after tylenol and prednisone. patient was concerned that she was having another heart attack as she felt similar when she had 100% RCA blockage in 10/2016. Review of Epic shows patient last LHC in 10/2016 showed mid-RCA 100% which was stented and proximal RCA 50% stenosis. At this time patient has had no pain for 24 hours, has a normal EKG and a troponin that is negative. Did not see a reason to repeat a second cardiac enzymes since we are 24 hours out from her last episode of pain. Explained to the patient that we unfortunately are  unable to rule out that her pain was caused by her heart disease especially since she has a known blockage and therefore it is very important for her to follow up with Cardiologist in 1-2 days. As of now there is no indication she had a heart attack with negative troponin and normal EKG. The fact pain resolved with tylenol and prednisone makes me more concerned this was her arthritis. Patient's BP is elevated however she did not take her clonidine. She is asymptomatic. Will give the clonidine and dc home. Discussed strict return precautions in case the pain recurs or if he develops chest pain. Otherwise she is supposed to follow  up with her cardiologist in the next 2 days. Patient is comfortable with the plan. She is now stable for discharge home.      As part of my medical decision making, I reviewed the following data within the [place] notes reviewed and incorporated, Labs reviewed , EKG interpreted , Old EKG reviewed, Old chart reviewed, Radiograph reviewed , Notes from prior ED visits and [place] Controlled Substance Database    Pertinent labs & imaging results that were available during my care of the patient were reviewed by me and considered in my medical decision making (see chart for details).    ____________________________________________   FINAL CLINICAL IMPRESSION(S) / ED DIAGNOSES  Final diagnoses:  Acute pain of left shoulder  Dizziness      NEW MEDICATIONS STARTED DURING THIS VISIT:  ED Discharge Orders    None       Note:  This document was prepared using Dragon voice recognition software and may include unintentional dictation errors.    Alfred Levins, [place], MD 10/24/17 [phone]

## 2017-10-24 NOTE — ED Notes (Signed)
Pt stating that she was having CP and nausea over the weekend. Pt came in to have it evaluated at [facility] but they sent her here. Pt stating that the pain and nausea has resolved and she had just come in to be checked. Dr. Alfred Levins at bedside for evaluation. Pt in NAD.

## 2017-10-24 NOTE — ED Notes (Signed)
Pt stating that she has not had her evening BP medications. Dr. Alfred Levins will be notified

## 2017-11-01 ENCOUNTER — Ambulatory Visit (INDEPENDENT_AMBULATORY_CARE_PROVIDER_SITE_OTHER): Payer: Medicare Other | Admitting: Family Medicine

## 2017-11-01 ENCOUNTER — Encounter: Payer: Self-pay | Admitting: Family Medicine

## 2017-11-01 VITALS — BP 136/78 | HR 71 | Temp 97.6°F | Resp 14 | Wt 191.3 lb

## 2017-11-01 DIAGNOSIS — R0981 Nasal congestion: Secondary | ICD-10-CM | POA: Diagnosis not present

## 2017-11-01 DIAGNOSIS — E871 Hypo-osmolality and hyponatremia: Secondary | ICD-10-CM | POA: Diagnosis not present

## 2017-11-01 DIAGNOSIS — I209 Angina pectoris, unspecified: Secondary | ICD-10-CM | POA: Diagnosis not present

## 2017-11-01 DIAGNOSIS — I10 Essential (primary) hypertension: Secondary | ICD-10-CM

## 2017-11-01 MED ORDER — CETIRIZINE HCL 10 MG PO TABS: 10.0000 mg | ORAL_TABLET | Freq: Every day | ORAL | 0 refills | Status: DC

## 2017-11-01 NOTE — Progress Notes (Addendum)
Name: Betty Nielsen   MRN: [id number]    DOB: Oct 03, 1945   Date:11/01/2017       Progress Note  Subjective  Chief Complaint  Chief Complaint  Patient presents with  . Nasal Congestion  . Hypertension    BP was high last night and this morning. pt states had a bad headache along with that. Drank vinegar and water and it went down.   Marland Kitchen URI    HPI   Nasal congestion: she has a long history of nasal congestion, seen by ENT and states nasal spray given in the past causes her to have dizziness. She has been taking Zyrtec without help. She denies rhinorrhea, cough or fever. Normal appetite.   HTN: she went to [facility] on 02/04 with left side chest pain and spike of bp, had negative cardiac enzymes, she was found to have anemia and low sodium. And CKI ( she sees Dr. Abigail Butts). She saw Dr. Clayborn Bigness since discharge and is on higher dose of Clonidine. bp today is at goal. She states when bp is high she has a headache. Chest pain resolved. She has a history of angina and history of MI  Patient Active Problem List   Diagnosis Date Noted  . Malnutrition of mild degree ([place]) 06/16/2017  . PMR (polymyalgia rheumatica) ([place]) 06/15/2017  . Elevated troponin 10/27/2016  . Essential hypertension, malignant 10/27/2016  . Headache 10/27/2016  . CKD (chronic kidney disease), stage III ([place]) 10/27/2016  . Hyperlipidemia 10/27/2016  . Hyponatremia 10/27/2016  . Angina pectoris ([place]) 10/25/2016  . History of non-ST elevation myocardial infarction (NSTEMI) 10/25/2016  . History of coronary angioplasty 10/25/2016  . BPV (benign positional vertigo) 06/15/2016  . 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[place]) 11/21/2015  . Anemia of chronic disease 06/05/2015  . Acid reflux 06/05/2015  . Hypertension, benign 06/05/2015  . Hypothyroidism, 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  . Major depression in remission ([place]) 06/05/2015  . Obesity (BMI 30.0-34.9) 06/05/2015  . Neuropathy 06/05/2015  . Arthritis, degenerative 07/03/2014  . AAA  (abdominal aortic aneurysm) without rupture ([place]) 06/26/2014    Past Surgical History:  Procedure Laterality Date  . ABDOMINAL AORTIC ANEURYSM REPAIR    . ABDOMINAL HYSTERECTOMY    . APPENDECTOMY    . CORONARY STENT INTERVENTION N/A 10/26/2016   Procedure: Coronary Stent Intervention;  Surgeon: Yolonda Kida, MD;  Location: [place] [facility];  Service: Cardiovascular;  Laterality: N/A;  . HERNIA REPAIR    . INNER EAR SURGERY     pt not sure of type  . LEFT HEART CATH AND CORONARY ANGIOGRAPHY N/A 10/26/2016   Procedure: Left Heart Cath and Coronary Angiography;  Surgeon: Teodoro Spray, MD;  Location: [place] [facility];  Service: Cardiovascular;  Laterality: N/A;  . THYROIDECTOMY      Family History  Problem Relation Age of Onset  . Cerebral aneurysm Father   . Hypertension Sister     Social History   Socioeconomic History  . Marital status: Widowed    Spouse name: Not on file  . Number of children: Not on file  . Years of education: Not on file  . Highest education level: Not on file  Social Needs  . Financial resource strain: Not on file  . Food insecurity - worry: Not on file  . Food insecurity - inability: Not on file  . Transportation needs - medical: Not on file  . Transportation needs - non-medical: Not on file  Occupational History  . Not on file  Tobacco  Use  . Smoking status: Former Smoker    Types: Cigarettes  . Smokeless tobacco: Never Used  Substance and Sexual Activity  . Alcohol use: No    Alcohol/week: 0.0 oz  . Drug use: No  . Sexual activity: Not Currently  Other Topics Concern  . Not on file  Social History Narrative  . Not on file     Current Outpatient Medications:  .  aspirin 81 MG tablet, Take 81 mg by mouth daily., Disp: , Rfl:  .  clopidogrel (PLAVIX) 75 MG tablet, Take by mouth., Disp: , Rfl:  .  irbesartan (AVAPRO) 300 MG tablet, Take 1 tablet (300 mg total) by mouth daily., Disp: 90 tablet, Rfl: 1 .  levothyroxine  (SYNTHROID, LEVOTHROID) 75 MCG tablet, Take 1 tablet (75 mcg total) by mouth daily before breakfast., Disp: 30 tablet, Rfl: 6 .  cetirizine (ZYRTEC) 10 MG tablet, Take 1 tablet (10 mg total) by mouth daily., Disp: 30 tablet, Rfl: 0 .  cloNIDine (CATAPRES) 0.2 MG tablet, Take 1 tablet by mouth 2 (two) times daily., Disp: , Rfl:  .  labetalol (NORMODYNE) 200 MG tablet, Take 1 tablet by mouth 2 (two) times daily., Disp: , Rfl:  .  nitroGLYCERIN (NITROSTAT) 0.4 MG SL tablet, Place 1 tablet (0.4 mg total) under the tongue every 5 (five) minutes as needed for chest pain. (Patient not taking: Reported on 11/01/2017), Disp: 20 tablet, Rfl: 12  Allergies  Allergen Reactions  . Ace Inhibitors Cough  . Ciprofloxacin Other (See Comments)  . Hydrocodone-Acetaminophen Other (See Comments)  . Norvasc [Amlodipine Besylate]     Pruritus   . Pantoprazole Sodium Other (See Comments)    Cramps  . Pravastatin Other (See Comments)    Stomach cramps  . Sulfa Antibiotics      ROS  Constitutional: Negative for fever or weight change.  Respiratory: Negative for cough and shortness of breath.   Cardiovascular: Negative for chest pain or palpitations.  Gastrointestinal: Negative for abdominal pain, no bowel changes.  Musculoskeletal: Negative for gait problem or joint swelling.  Skin: Negative for rash.  Neurological: Positive  for intermittent dizziness and  headache.  No other specific complaints in a complete review of systems (except as listed in HPI above).  Objective  Vitals:   11/01/17 1053  BP: 136/78  Pulse: 71  Resp: 14  Temp: 97.6 F (36.4 C)  TempSrc: Oral  SpO2: 98%  Weight: 191 lb 4.8 oz (86.8 kg)    Body mass index is 32.84 kg/m.  Physical Exam  Constitutional: Patient appears well-developed and well-nourished. Obese  No distress.  HEENT: head atraumatic, normocephalic, pupils equal and reactive to light, ears : hearing aid on left side, boggy turbinates,  neck supple, throat  within normal limits Cardiovascular: Normal rate, regular rhythm and normal heart sounds.  No murmur heard. No BLE edema. Pulmonary/Chest: Effort normal and breath sounds normal. No respiratory distress. Abdominal: Soft.  There is no tenderness. Psychiatric: Patient has a normal mood and affect. behavior is normal. Judgment and thought content normal.   Recent Results (from the past 2160 hour(s))  Basic metabolic panel     Status: Abnormal   Collection Time: 10/24/17  2:29 PM  Result Value Ref Range   Sodium 129 (L) 135 - 145 mmol/L   Potassium 4.4 3.5 - 5.1 mmol/L   Chloride 98 (L) 101 - 111 mmol/L   CO2 23 22 - 32 mmol/L   Glucose, Bld 99 65 - 99 mg/dL  BUN 16 6 - 20 mg/dL   Creatinine, Ser 1.39 (H) 0.44 - 1.00 mg/dL   Calcium 9.2 8.9 - 10.3 mg/dL   GFR calc non Af Amer 37 (L) >60 mL/min   GFR calc Af Amer 43 (L) >60 mL/min    Comment: (NOTE) The eGFR has been calculated using the CKD EPI equation. This calculation has not been validated in all clinical situations. eGFR's persistently <60 mL/min signify possible Chronic Kidney Disease.    Anion gap 8 5 - 15    Comment: Performed at [facility], [address] [place], [place] [address]  CBC     Status: Abnormal   Collection Time: 10/24/17  2:29 PM  Result Value Ref Range   WBC 5.2 3.6 - 11.0 K/uL   RBC 3.50 (L) 3.80 - 5.20 MIL/uL   Hemoglobin 10.6 (L) 12.0 - 16.0 g/dL   HCT 32.0 (L) 35.0 - 47.0 %   MCV 91.3 80.0 - 100.0 fL   MCH 30.3 26.0 - 34.0 pg   MCHC 33.2 32.0 - 36.0 g/dL   RDW 13.2 11.5 - 14.5 %   Platelets 312 150 - 440 K/uL    Comment: Performed at [facility], [address] [place], [place] [address]  Troponin I     Status: None   Collection Time: 10/24/17  2:29 PM  Result Value Ref Range   Troponin I <0.03 <0.03 ng/mL    Comment: Performed at [facility], [address] [place], [place] [address]  Protime-INR     Status: None   Collection Time: 10/24/17  2:29 PM   Result Value Ref Range   Prothrombin Time 13.3 11.4 - 15.2 seconds   INR 1.02     Comment: Performed at [facility], [address] [place], [place] [address]  APTT     Status: None   Collection Time: 10/24/17  2:29 PM  Result Value Ref Range   aPTT 29 24 - 36 seconds    Comment: Performed at [facility], [address] [place], [place] [address]     PHQ2/9: Depression screen [facility] 2/9 11/01/2017 03/21/2017 12/06/2016 06/15/2016 11/21/2015  Decreased Interest 0 0 0 0 0  Down, Depressed, Hopeless 0 0 0 0 0  PHQ - 2 Score 0 0 0 0 0  Altered sleeping - - 0 - -  Tired, decreased energy - - 1 - -  Change in appetite - - 0 - -  Feeling bad or failure about yourself  - - 0 - -  Trouble concentrating - - 0 - -  Moving slowly or fidgety/restless - - 0 - -  Suicidal thoughts - - 0 - -  PHQ-9 Score - - 1 - -  Difficult doing work/chores - - Not difficult at all - -     Fall Risk: Fall Risk  11/01/2017 06/15/2017 03/21/2017 12/06/2016 06/15/2016  Falls in the past year? No No No No No     Functional Status Survey: Is the patient deaf or have difficulty hearing?: No Does the patient have difficulty seeing, even when wearing glasses/contacts?: No Does the patient have difficulty concentrating, remembering, or making decisions?: No Does the patient have difficulty walking or climbing stairs?: No Does the patient have difficulty dressing or bathing?: No Does the patient have difficulty doing errands alone such as visiting a doctor's office or shopping?: No    Assessment & Plan  1. Hypertension, benign  Seen by Dr. Clayborn Bigness recently and  bp has improved with adjustment of clonidine to 0.2 mg twice daily, also on higher dose of labetolol   2. Angina pectoris ([place])  Went to [facility], seen by Dr. Clayborn Bigness, pain has resolved since visit to [facility]  3. Nasal congestion  Advised saline spray, seen by ENT and states nasal sprays causes her to get dizzy  - cetirizine (ZYRTEC) 10 MG  tablet; Take 1 tablet (10 mg total) by mouth daily.  Dispense: 30 tablet; Refill: 0  4. Hyponatremia  She refuses to have labs done today

## 2017-12-14 ENCOUNTER — Encounter: Payer: Self-pay | Admitting: Family Medicine

## 2017-12-14 ENCOUNTER — Ambulatory Visit (INDEPENDENT_AMBULATORY_CARE_PROVIDER_SITE_OTHER): Payer: Medicare Other | Admitting: Family Medicine

## 2017-12-14 VITALS — BP 122/70 | HR 79 | Resp 16 | Ht 65.0 in | Wt 190.7 lb

## 2017-12-14 DIAGNOSIS — M353 Polymyalgia rheumatica: Secondary | ICD-10-CM | POA: Diagnosis not present

## 2017-12-14 DIAGNOSIS — K219 Gastro-esophageal reflux disease without esophagitis: Secondary | ICD-10-CM

## 2017-12-14 DIAGNOSIS — E890 Postprocedural hypothyroidism: Secondary | ICD-10-CM | POA: Diagnosis not present

## 2017-12-14 DIAGNOSIS — N183 Chronic kidney disease, stage 3 unspecified: Secondary | ICD-10-CM

## 2017-12-14 DIAGNOSIS — I700 Atherosclerosis of aorta: Secondary | ICD-10-CM | POA: Diagnosis not present

## 2017-12-14 DIAGNOSIS — D638 Anemia in other chronic diseases classified elsewhere: Secondary | ICD-10-CM | POA: Diagnosis not present

## 2017-12-14 DIAGNOSIS — E441 Mild protein-calorie malnutrition: Secondary | ICD-10-CM

## 2017-12-14 DIAGNOSIS — I10 Essential (primary) hypertension: Secondary | ICD-10-CM | POA: Diagnosis not present

## 2017-12-14 DIAGNOSIS — I209 Angina pectoris, unspecified: Secondary | ICD-10-CM | POA: Diagnosis not present

## 2017-12-14 DIAGNOSIS — F325 Major depressive disorder, single episode, in full remission: Secondary | ICD-10-CM | POA: Diagnosis not present

## 2017-12-14 LAB — TSH: TSH: 0.84 m[IU]/L (ref 0.40–4.50)

## 2017-12-14 NOTE — Progress Notes (Signed)
Name: Betty Nielsen   MRN: [id number]    DOB: September 20, 1946   Date:12/14/2017       Progress Note  Subjective  Chief Complaint  Chief Complaint  Patient presents with  . Hypertension  . Hyperlipidemia  . Depression    HPI  Hyponatremia: she had a syncopal episode 02/23/2017, she was found to have high bp and low sodium. She has been seeing Dr. Abigail Butts, she has been off SSRI but levels still low. She does not want to have labs recheck , she will follow up with Dr. Arn Medal III/ Hyponatremia: stable kidney function when looked at labs done at [facility] 10/2017. Good urine output, she denies pruritus, no nausea or vomiting.   HTN/CAD/Angina: doing well bp is at goal, she denies sob with activity, she states no longer feeling as tired since stopped taking clonidine in the mornings and is taking nifedipine.  She has not used any NTG since Feb 2018 ( when she had MI). She is off plavix, still taking aspirin daily, no easy bleeding.   PMR: seeing Dr. Jefm Bryant, tender joints and muscles all over, worse on hips and sometimes shoulders, tried a round of prednisone without much help, taking tylenol prn pain  Atherosclerosis abdominal aorta/hyperlipidemia: unable to tolerate statin, currently on 81 mg aspirin she denies easy bleeding. She also has a history of aneurysmal of aorta repair done by Dr. Doren Custard ( vascular surgeon in [place])  Hyperlipidemia: LDL dangerously high, but patient is not willing to try statin, too scared of side effects, she has a history of MI and still not willing. She also has atherosclerosis of abdominal aorta, discussed risk of strokes and heart attacks, explained we have other agents besides statins, ( including Zetia ) to improve LDL. Advised labs, but she also refuses lab work at this time  Hypothyroidism: she is s/p thyroidectomy was having labs done by Dr. Pryor Ochoa, Compliant with medication, denies dry skin, states constipation has been controlled with Miralax. Last  TSH was at goal, she is taking Synthroid 75 mcg daily and we will recheck labs today. No change in symptoms  GERD: under control at this time, no heartburn or regurgitation. She states labetolol is the cause of the symptoms but is doing better now  Depression: she is off SSRI because of hyponatremia, but states she is feeling well, in remission. 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 She denies sadness, crying spells, change in appetite, or suicidal thoughts. Unchanged.   Malnutrition: she states appetite has improved and weight is stable, does not want to check labs to see if albumin is normal   Patient Active Problem List   Diagnosis Date Noted  . Malnutrition of mild degree ([place]) 06/16/2017  . PMR (polymyalgia rheumatica) ([place]) 06/15/2017  . Elevated troponin 10/27/2016  . Essential hypertension, malignant 10/27/2016  . Headache 10/27/2016  . CKD (chronic kidney disease), stage III ([place]) 10/27/2016  . Hyperlipidemia 10/27/2016  . Hyponatremia 10/27/2016  . Angina pectoris ([place]) 10/25/2016  . History of non-ST elevation myocardial infarction (NSTEMI) 10/25/2016  . History of coronary angioplasty 10/25/2016  . BPV (benign positional vertigo) 06/15/2016  . 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[place]) 11/21/2015  . Anemia of chronic disease 06/05/2015  . Acid reflux 06/05/2015  . Hypertension, benign 06/05/2015  . Hypothyroidism, 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  . Major depression in remission ([place]) 06/05/2015  . Obesity (BMI 30.0-34.9) 06/05/2015  . Neuropathy 06/05/2015  . Arthritis, degenerative 07/03/2014  . AAA (abdominal aortic aneurysm) without  rupture ([place]) 06/26/2014    Past Surgical History:  Procedure Laterality Date  . ABDOMINAL AORTIC ANEURYSM REPAIR    . ABDOMINAL HYSTERECTOMY    . APPENDECTOMY    . CORONARY STENT INTERVENTION N/A 10/26/2016   Procedure: Coronary Stent Intervention;  Surgeon: Yolonda Kida, MD;  Location: [place] [facility];  Service:  Cardiovascular;  Laterality: N/A;  . HERNIA REPAIR    . INNER EAR SURGERY     pt not sure of type  . LEFT HEART CATH AND CORONARY ANGIOGRAPHY N/A 10/26/2016   Procedure: Left Heart Cath and Coronary Angiography;  Surgeon: Teodoro Spray, MD;  Location: [place] [facility];  Service: Cardiovascular;  Laterality: N/A;  . THYROIDECTOMY      Family History  Problem Relation Age of Onset  . Cerebral aneurysm Father   . Hypertension Sister     Social History   Socioeconomic History  . Marital status: Widowed    Spouse name: Not on file  . Number of children: 1  . Years of education: Not on file  . Highest education level: Not on file  Occupational History  . Not on file  Social Needs  . Financial resource strain: Not very hard  . Food insecurity:    Worry: Never true    Inability: Never true  . Transportation needs:    Medical: No    Non-medical: No  Tobacco Use  . Smoking status: Former Smoker    Types: Cigarettes  . Smokeless tobacco: Never Used  Substance and Sexual Activity  . Alcohol use: No    Alcohol/week: 0.0 oz  . Drug use: No  . Sexual activity: Not Currently  Lifestyle  . Physical activity:    Days per week: 0 days    Minutes per session: 0 min  . Stress: Not at all  Relationships  . Social connections:    Talks on phone: Not on file    Gets together: Not on file    Attends religious service: Not on file    Active member of club or organization: Not on file    Attends meetings of clubs or organizations: Not on file    Relationship status: Not on file  . Intimate partner violence:    Fear of current or ex partner: Not on file    Emotionally abused: Not on file    Physically abused: Not on file    Forced sexual activity: Not on file  Other Topics Concern  . Not on file  Social History Narrative   Lives alone   Retired from working a mill      Current Outpatient Medications:  .  aspirin 81 MG tablet, Take 81 mg by mouth daily., Disp: , Rfl:  .   cetirizine (ZYRTEC) 10 MG tablet, Take 1 tablet (10 mg total) by mouth daily., Disp: 30 tablet, Rfl: 0 .  cloNIDine (CATAPRES) 0.2 MG tablet, Take 1 tablet by mouth at bedtime., Disp: , Rfl:  .  irbesartan (AVAPRO) 300 MG tablet, Take 1 tablet (300 mg total) by mouth daily., Disp: 90 tablet, Rfl: 1 .  labetalol (NORMODYNE) 200 MG tablet, Take 1 tablet by mouth 2 (two) times daily., Disp: , Rfl:  .  levothyroxine (SYNTHROID, LEVOTHROID) 75 MCG tablet, Take 1 tablet (75 mcg total) by mouth daily before breakfast., Disp: 30 tablet, Rfl: 6 .  NIFEdipine (PROCARDIA-XL/ADALAT CC) 30 MG 24 hr tablet, Take 30 mg by mouth daily., Disp: , Rfl:  .  nitroGLYCERIN (NITROSTAT)  0.4 MG SL tablet, Place 1 tablet (0.4 mg total) under the tongue every 5 (five) minutes as needed for chest pain., Disp: 20 tablet, Rfl: 12  Allergies  Allergen Reactions  . Ace Inhibitors Cough  . Ciprofloxacin Other (See Comments)  . Hydrocodone-Acetaminophen Other (See Comments)  . Norvasc [Amlodipine Besylate]     Pruritus   . Pantoprazole Sodium Other (See Comments)    Cramps  . Pravastatin Other (See Comments)    Stomach cramps  . Sulfa Antibiotics      ROS  Constitutional: Negative for fever or weight change.  Respiratory: Negative for cough and shortness of breath.   Cardiovascular: Negative for chest pain or palpitations.  Gastrointestinal: Negative for abdominal pain, no bowel changes.  Musculoskeletal: Negative for gait problem or joint swelling.  Skin: Negative for rash.  Neurological: Negative for dizziness or headache.  No other specific complaints in a complete review of systems (except as listed in HPI above).  Objective  Vitals:   12/14/17 1323  BP: 122/70  Pulse: 79  Resp: 16  SpO2: 98%  Weight: 190 lb 11.2 oz (86.5 kg)  Height: '5\' 5"'$  (1.651 m)    Body mass index is 31.73 kg/m.  Physical Exam  Constitutional: Patient appears well-developed and well-nourished. Obese No distress.  HEENT:  head atraumatic, normocephalic, pupils equal and reactive to light, neck supple, throat within normal limits Cardiovascular: Normal rate, regular rhythm and normal heart sounds.  No murmur heard. No BLE edema. Pulmonary/Chest: Effort normal and breath sounds normal. No respiratory distress. Abdominal: Soft.  There is no tenderness. Psychiatric: Patient has a normal mood and affect. behavior is normal. Judgment and thought content normal. Muscular Skeletal: normal rom of shoulders and hips, but has daily pain    Recent Results (from the past 2160 hour(s))  Basic metabolic panel     Status: Abnormal   Collection Time: 10/24/17  2:29 PM  Result Value Ref Range   Sodium 129 (L) 135 - 145 mmol/L   Potassium 4.4 3.5 - 5.1 mmol/L   Chloride 98 (L) 101 - 111 mmol/L   CO2 23 22 - 32 mmol/L   Glucose, Bld 99 65 - 99 mg/dL   BUN 16 6 - 20 mg/dL   Creatinine, Ser 1.39 (H) 0.44 - 1.00 mg/dL   Calcium 9.2 8.9 - 10.3 mg/dL   GFR calc non Af Amer 37 (L) >60 mL/min   GFR calc Af Amer 43 (L) >60 mL/min    Comment: (NOTE) The eGFR has been calculated using the CKD EPI equation. This calculation has not been validated in all clinical situations. eGFR's persistently <60 mL/min signify possible Chronic Kidney Disease.    Anion gap 8 5 - 15    Comment: Performed at [facility], [address] [place], [place] [address]  CBC     Status: Abnormal   Collection Time: 10/24/17  2:29 PM  Result Value Ref Range   WBC 5.2 3.6 - 11.0 K/uL   RBC 3.50 (L) 3.80 - 5.20 MIL/uL   Hemoglobin 10.6 (L) 12.0 - 16.0 g/dL   HCT 32.0 (L) 35.0 - 47.0 %   MCV 91.3 80.0 - 100.0 fL   MCH 30.3 26.0 - 34.0 pg   MCHC 33.2 32.0 - 36.0 g/dL   RDW 13.2 11.5 - 14.5 %   Platelets 312 150 - 440 K/uL    Comment: Performed at [facility], [address] [place],  [address]  Troponin I  Status: None   Collection Time: 10/24/17  2:29 PM  Result Value Ref Range   Troponin I <0.03 <0.03 ng/mL     Comment: Performed at [facility], [address] [place], [place] [address]  Protime-INR     Status: None   Collection Time: 10/24/17  2:29 PM  Result Value Ref Range   Prothrombin Time 13.3 11.4 - 15.2 seconds   INR 1.02     Comment: Performed at [facility], [place], [place] [address]  APTT     Status: None   Collection Time: 10/24/17  2:29 PM  Result Value Ref Range   aPTT 29 24 - 36 seconds    Comment: Performed at [facility], [address] [place], [place] [address]      PHQ2/9: Depression screen [facility] 2/9 11/01/2017 03/21/2017 12/06/2016 06/15/2016 11/21/2015  Decreased Interest 0 0 0 0 0  Down, Depressed, Hopeless 0 0 0 0 0  PHQ - 2 Score 0 0 0 0 0  Altered sleeping - - 0 - -  Tired, decreased energy - - 1 - -  Change in appetite - - 0 - -  Feeling bad or failure about yourself  - - 0 - -  Trouble concentrating - - 0 - -  Moving slowly or fidgety/restless - - 0 - -  Suicidal thoughts - - 0 - -  PHQ-9 Score - - 1 - -  Difficult doing work/chores - - Not difficult at all - -     Fall Risk: Fall Risk  12/14/2017 11/01/2017 06/15/2017 03/21/2017 12/06/2016  Falls in the past year? No No No No No      Assessment & Plan  1. PMR (polymyalgia rheumatica) (HCC)  She is under the care of Dr, Jefm Bryant, she is not taking steroids, states Tylenol works best for her   2. Mild malnutrition ([place])  She refuses to recheck labs today , she had basic metabolic panel done one month ago, but no albumin   3. Major depression in remission [facility])  She stopped medication on her own   4. 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HCC)  Refuses statin therapy, she is intolerant to pravastatin, discussed Zetia, but she refuses any type of medication or lab work, she states she will discuss it with Dr. Clayborn Bigness   5. Angina pectoris (HCC)  No chest pain at this time, seeing Dr. Clayborn Bigness, off Plavix since 10/2017 no problems since. Still on aspirin,  s/p stent placed, monitored by Dr. Clayborn Bigness   6. Hypertension, benign  bp is at goal, off clonidine in am, taking nifedipine in am and clonidine at night.   7. Hypothyroidism, postsurgical  -TSH  8. Gastroesophageal reflux disease without esophagitis  Under control with medication   9. CKD (chronic kidney disease), stage III (HCC)  Low sodium and chloride during last visit to [facility], patient does not want to check labs today, she will follow up with Dr. Abigail Butts

## 2017-12-15 ENCOUNTER — Other Ambulatory Visit: Payer: Self-pay | Admitting: Family Medicine

## 2017-12-15 DIAGNOSIS — E890 Postprocedural hypothyroidism: Secondary | ICD-10-CM

## 2017-12-15 MED ORDER — LEVOTHYROXINE SODIUM 75 MCG PO TABS: 75.0000 ug | ORAL_TABLET | Freq: Every day | ORAL | 6 refills | Status: DC

## 2017-12-18 ENCOUNTER — Other Ambulatory Visit: Payer: Self-pay | Admitting: Family Medicine

## 2018-01-02 ENCOUNTER — Encounter: Payer: Self-pay | Admitting: Family Medicine

## 2018-01-05 ENCOUNTER — Encounter: Payer: Self-pay | Admitting: Family Medicine

## 2018-03-30 ENCOUNTER — Telehealth: Payer: Self-pay

## 2018-03-30 ENCOUNTER — Ambulatory Visit (INDEPENDENT_AMBULATORY_CARE_PROVIDER_SITE_OTHER): Payer: Medicare Other

## 2018-03-30 VITALS — BP 136/80 | HR 65 | Temp 97.6°F | Resp 12 | Ht 65.0 in | Wt 190.0 lb

## 2018-03-30 DIAGNOSIS — Z0000 Encounter for general adult medical examination without abnormal findings: Secondary | ICD-10-CM

## 2018-03-30 DIAGNOSIS — E890 Postprocedural hypothyroidism: Secondary | ICD-10-CM

## 2018-03-30 NOTE — Patient Instructions (Signed)
Betty Nielsen , Thank you for taking time to come for your Medicare Wellness Visit. I appreciate your ongoing commitment to your health goals. Please review the following plan we discussed and let me know if I can assist you in the future.   Screening recommendations/referrals: Colorectal Screening: Declined Mammogram: Declined Bone Density: Declined Lung Cancer Screening: You do not not qualify for this screening Hepatitis C Screening: Up to date  Vision and Dental Exams: Recommended annual ophthalmology exams for early detection of glaucoma and other disorders of the eye Recommended annual dental exams for proper oral hygiene  Vaccinations: Influenza vaccine: Up to date Pneumococcal vaccine: Up to date Tdap vaccine: Declined. Please call your insurance company to determine your out of pocket expense. You may also receive this vaccine at your local pharmacy or Health Dept. Shingles vaccine: Please call your insurance company to determine your out of pocket expense for the Shingrix vaccine. You may also receive this vaccine at your local pharmacy or Health Dept.  Advanced directives: Declined to provide copies of your Advance Directives.  Goals: Recommend to drink at least 6-8 8oz glasses of water per day.  Next appointment: Please schedule your Annual Wellness Visit with your Nurse Health Advisor in one year.  Preventive Care 72 Years and Older, Female Preventive care refers to lifestyle choices and visits with your health care provider that can promote health and wellness. What does preventive care include?  A yearly physical exam. This is also called an annual well check.  Dental exams once or twice a year.  Routine eye exams. Ask your health care provider how often you should have your eyes checked.  Personal lifestyle choices, including:  Daily care of your teeth and gums.  Regular physical activity.  Eating a healthy diet.  Avoiding tobacco and drug use.  Limiting  alcohol use.  Practicing safe sex.  Taking low-dose aspirin every day.  Taking vitamin and mineral supplements as recommended by your health care provider. What happens during an annual well check? The services and screenings done by your health care provider during your annual well check will depend on your age, overall health, lifestyle risk factors, and family history of disease. Counseling  Your health care provider may ask you questions about your:  Alcohol use.  Tobacco use.  Drug use.  Emotional well-being.  Home and relationship well-being.  Sexual activity.  Eating habits.  History of falls.  Memory and ability to understand (cognition).  Work and work Statistician.  Reproductive health. Screening  You may have the following tests or measurements:  Height, weight, and BMI.  Blood pressure.  Lipid and cholesterol levels. These may be checked every 5 years, or more frequently if you are over 29 years old.  Skin check.  Lung cancer screening. You may have this screening every year starting at age 38 if you have a 30-pack-year history of smoking and currently smoke or have quit within the past 72 years.  Fecal occult blood test (FOBT) of the stool. You may have this test every year starting at age 76.  Flexible sigmoidoscopy or colonoscopy. You may have a sigmoidoscopy every 5 years or a colonoscopy every 10 years starting at age 73.  Hepatitis C blood test.  Hepatitis B blood test.  Sexually transmitted disease (STD) testing.  Diabetes screening. This is done by checking your blood sugar (glucose) after you have not eaten for a while (fasting). You may have this done every 1-3 years.  Bone density scan. This  is done to screen for osteoporosis. You may have this done starting at age 60.  Mammogram. This may be done every 1-2 years. Talk to your health care provider about how often you should have regular mammograms. Talk with your health care provider  about your test results, treatment options, and if necessary, the need for more tests. Vaccines  Your health care provider may recommend certain vaccines, such as:  Influenza vaccine. This is recommended every year.  Tetanus, diphtheria, and acellular pertussis (Tdap, Td) vaccine. You may need a Td booster every 10 years.  Zoster vaccine. You may need this after age 76.  Pneumococcal 13-valent conjugate (PCV13) vaccine. One dose is recommended after age 58.  Pneumococcal polysaccharide (PPSV23) vaccine. One dose is recommended after age 69. Talk to your health care provider about which screenings and vaccines you need and how often you need them. This information is not intended to replace advice given to you by your health care provider. Make sure you discuss any questions you have with your health care provider. Document Released: 10/03/2015 Document Revised: 05/26/2016 Document Reviewed: 07/08/2015 Elsevier Interactive Patient Education  2017 [place] Prevention in the Home Falls can cause injuries. They can happen to people of all ages. There are many things you can do to make your home safe and to help prevent falls. What can I do on the outside of my home?  Regularly fix the edges of walkways and driveways and fix any cracks.  Remove anything that might make you trip as you walk through a door, such as a raised step or threshold.  Trim any bushes or trees on the path to your home.  Use bright outdoor lighting.  Clear any walking paths of anything that might make someone trip, such as rocks or tools.  Regularly check to see if handrails are loose or broken. Make sure that both sides of any steps have handrails.  Any raised decks and porches should have guardrails on the edges.  Have any leaves, snow, or ice cleared regularly.  Use sand or salt on walking paths during winter.  Clean up any spills in your garage right away. This includes oil or grease  spills. What can I do in the bathroom?  Use night lights.  Install grab bars by the toilet and in the tub and shower. Do not use towel bars as grab bars.  Use non-skid mats or decals in the tub or shower.  If you need to sit down in the shower, use a plastic, non-slip stool.  Keep the floor dry. Clean up any water that spills on the floor as soon as it happens.  Remove soap buildup in the tub or shower regularly.  Attach bath mats securely with double-sided non-slip rug tape.  Do not have throw rugs and other things on the floor that can make you trip. What can I do in the bedroom?  Use night lights.  Make sure that you have a light by your bed that is easy to reach.  Do not use any sheets or blankets that are too big for your bed. They should not hang down onto the floor.  Have a firm chair that has side arms. You can use this for support while you get dressed.  Do not have throw rugs and other things on the floor that can make you trip. What can I do in the kitchen?  Clean up any spills right away.  Avoid walking on wet floors.  Keep items that you use a lot in easy-to-reach places.  If you need to reach something above you, use a strong step stool that has a grab bar.  Keep electrical cords out of the way.  Do not use floor polish or wax that makes floors slippery. If you must use wax, use non-skid floor wax.  Do not have throw rugs and other things on the floor that can make you trip. What can I do with my stairs?  Do not leave any items on the stairs.  Make sure that there are handrails on both sides of the stairs and use them. Fix handrails that are broken or loose. Make sure that handrails are as long as the stairways.  Check any carpeting to make sure that it is firmly attached to the stairs. Fix any carpet that is loose or worn.  Avoid having throw rugs at the top or bottom of the stairs. If you do have throw rugs, attach them to the floor with carpet  tape.  Make sure that you have a light switch at the top of the stairs and the bottom of the stairs. If you do not have them, ask someone to add them for you. What else can I do to help prevent falls?  Wear shoes that:  Do not have high heels.  Have rubber bottoms.  Are comfortable and fit you well.  Are closed at the toe. Do not wear sandals.  If you use a stepladder:  Make sure that it is fully opened. Do not climb a closed stepladder.  Make sure that both sides of the stepladder are locked into place.  Ask someone to hold it for you, if possible.  Clearly mark and make sure that you can see:  Any grab bars or handrails.  First and last steps.  Where the edge of each step is.  Use tools that help you move around (mobility aids) if they are needed. These include:  Canes.  Walkers.  Scooters.  Crutches.  Turn on the lights when you go into a dark area. Replace any light bulbs as soon as they burn out.  Set up your furniture so you have a clear path. Avoid moving your furniture around.  If any of your floors are uneven, fix them.  If there are any pets around you, be aware of where they are.  Review your medicines with your doctor. Some medicines can make you feel dizzy. This can increase your chance of falling. Ask your doctor what other things that you can do to help prevent falls. This information is not intended to replace advice given to you by your health care provider. Make sure you discuss any questions you have with your health care provider. Document Released: 07/03/2009 Document Revised: 02/12/2016 Document Reviewed: 10/11/2014 Elsevier Interactive Patient Education  2017 [employer].

## 2018-03-30 NOTE — Progress Notes (Addendum)
Subjective:   Betty Nielsen is a 72 y.o. female who presents for Medicare Annual (Subsequent) preventive examination.  Review of Systems:  N/A Cardiac Risk Factors include: dyslipidemia;hypertension;obesity (BMI >30kg/m2);advanced age (>30men, >27 women);sedentary lifestyle     Objective:     Vitals: BP 136/80 (BP Location: Left Arm, Patient Position: Sitting, Cuff Size: Large)   Pulse 65   Temp 97.6 F (36.4 C) (Oral)   Resp 12   Ht 5\' 5"  (1.651 m)   Wt 190 lb (86.2 kg)   SpO2 96%   BMI 31.62 kg/m   Body mass index is 31.62 kg/m.  Advanced Directives 03/30/2018 10/24/2017 03/21/2017 02/23/2017 12/06/2016 10/25/2016 10/25/2016  Does Patient Have a Medical Advance Directive? Yes No Yes Yes Yes Yes No  Type of Paramedic of [place];Living will - Living will;Healthcare Power of Attorney Living will [place];Living will Living will -  Copy of [place] in Chart? No - copy requested. However, pt is adamant that she will not provide these documents for review and scanning purposes. Education provided but still declined - No - copy requested - - - -  Would patient like information on creating a medical advance directive? No - Patient declined. However, pt is adamant that she will not provide these documents for review and scanning purposes. Education provided but still declined No - Patient declined - - - - -    Tobacco Social History   Tobacco Use  Smoking Status Former Smoker  . Packs/day: 0.50  . Years: 20.00  . Pack years: 10.00  . Types: Cigarettes  . Last attempt to quit: 2000  . Years since quitting: 19.5  Smokeless Tobacco Never Used  Tobacco Comment   smoking cessation materials not required     Counseling given: No Comment: smoking cessation materials not required  Clinical Intake:  Pre-visit preparation completed: Yes  Pain : No/denies pain   BMI - recorded: 31.62 Nutritional Status: BMI > 30   Obese Nutritional Risks: None Diabetes: No  How often do you need to have someone help you when you read instructions, pamphlets, or other written materials from your doctor or pharmacy?: 1 - Never  Interpreter Needed?: No  Information entered by :: AEversole, LPN  Past Medical History:  Diagnosis Date  . Arthrosis of knee   . Chronic kidney disease   . Hyperlipidemia   . Hypertension   . Loss of hearing   . Metabolic syndrome   . Plantar fasciitis    Past Surgical History:  Procedure Laterality Date  . ABDOMINAL AORTIC ANEURYSM REPAIR    . ABDOMINAL HYSTERECTOMY    . APPENDECTOMY    . CORONARY STENT INTERVENTION N/A 10/26/2016   Procedure: Coronary Stent Intervention;  Surgeon: Yolonda Kida, MD;  Location: [facility];  Service: Cardiovascular;  Laterality: N/A;  . HERNIA REPAIR    . INNER EAR SURGERY     pt not sure of type  . LEFT HEART CATH AND CORONARY ANGIOGRAPHY N/A 10/26/2016   Procedure: Left Heart Cath and Coronary Angiography;  Surgeon: Teodoro Spray, MD;  Location: [facility];  Service: Cardiovascular;  Laterality: N/A;  . THYROIDECTOMY     Family History  Problem Relation Age of Onset  . Healthy Mother   . Cerebral aneurysm Father    Social History   Socioeconomic History  . Marital status: Widowed    Spouse name: Not on file  . Number of  children: 1  . Years of education: GED  . Highest education level: 10th grade  Occupational History  . Occupation: Retired  Scientific laboratory technician  . Financial resource strain: Not hard at all  . Food insecurity:    Worry: Never true    Inability: Never true  . Transportation needs:    Medical: No    Non-medical: No  Tobacco Use  . Smoking status: Former Smoker    Packs/day: 0.50    Years: 20.00    Pack years: 10.00    Types: Cigarettes    Last attempt to quit: 2000    Years since quitting: 19.5  . Smokeless tobacco: Never Used  . Tobacco comment: smoking cessation materials not required   Substance and Sexual Activity  . Alcohol use: No    Alcohol/week: 0.0 oz  . Drug use: No  . Sexual activity: Not Currently  Lifestyle  . Physical activity:    Days per week: 3 days    Minutes per session: 120 min  . Stress: Not at all  Relationships  . Social connections:    Talks on phone: Patient refused    Gets together: Patient refused    Attends religious service: Patient refused    Active member of club or organization: Patient refused    Attends meetings of clubs or organizations: Patient refused    Relationship status: Widowed  Other Topics Concern  . Not on file  Social History Narrative   Lives alone   Retired from working a [employer] Encounter Medications as of 03/30/2018  Medication Sig  . aspirin 81 MG tablet Take 81 mg by mouth daily.  . cetirizine (ZYRTEC) 10 MG tablet Take 1 tablet (10 mg total) by mouth daily.  . cloNIDine (CATAPRES) 0.2 MG tablet Take 1 tablet by mouth at bedtime.  . irbesartan (AVAPRO) 300 MG tablet Take 1 tablet (300 mg total) by mouth daily.  Marland Kitchen labetalol (NORMODYNE) 200 MG tablet Take 1 tablet by mouth 2 (two) times daily.  Marland Kitchen levothyroxine (SYNTHROID, LEVOTHROID) 75 MCG tablet Take 1 tablet (75 mcg total) by mouth daily before breakfast.  . MULTIPLE VITAMIN PO Take 1 capsule by mouth daily.  . nitroGLYCERIN (NITROSTAT) 0.4 MG SL tablet Place 1 tablet (0.4 mg total) under the tongue every 5 (five) minutes as needed for chest pain.  . Omega-3 Fatty Acids (FISH OIL) 1000 MG CPDR Take 2 capsules by mouth daily.  Marland Kitchen NIFEdipine (PROCARDIA-XL/ADALAT CC) 30 MG 24 hr tablet Take 30 mg by mouth daily.   No facility-administered encounter medications on file as of 03/30/2018.     Activities of Daily Living In your present state of health, do you have any difficulty performing the following activities: 03/30/2018 11/01/2017  Hearing? N N  Comment L hearing aid -  Vision? Y N  Comment wears eyeglasses but unable to see out of them. Refused  referral to be schedule for eye exam -  Difficulty concentrating or making decisions? N N  Walking or climbing stairs? Y N  Comment knee and hip pain -  Dressing or bathing? N N  Doing errands, shopping? N N  Preparing Food and eating ? N -  Comment denies dentures -  Using the Toilet? N -  In the past six months, have you accidently leaked urine? N -  Do you have problems with loss of bowel control? N -  Managing your Medications? N -  Managing your Finances? N -  Housekeeping or  managing your Housekeeping? N -  Some recent data might be hidden    Patient Care Team: Steele Sizer, MD as PCP - General (Family Medicine) Lavonia Dana, MD as Consulting Physician (Internal Medicine) Yolonda Kida, MD as Consulting Physician (Cardiology) Emmaline Kluver., MD as Consulting Physician (Rheumatology) Angelia Mould, MD as Consulting Physician (Vascular Surgery) Carloyn Manner, MD as Referring Physician (Otolaryngology)    Assessment:   This is a routine wellness examination for Betty Nielsen.  Exercise Activities and Dietary recommendations Current Exercise Habits: The patient does not participate in regular exercise at present;Home exercise routine, Type of exercise: strength training/weights, Time (Minutes): > 60, Frequency (Times/Week): 3, Weekly Exercise (Minutes/Week): 0, Intensity: Mild, Exercise limited by: None identified  Goals    . DIET - INCREASE WATER INTAKE     Recommend to drink at least 6-8 8oz glasses of water per day.       Fall Risk Fall Risk  03/30/2018 12/14/2017 11/01/2017 06/15/2017 03/21/2017  Falls in the past year? No No No No No  Risk for fall due to : Impaired vision - - - -  Risk for fall due to: Comment wears eyeglasses but unable to see out of them. Refused referral to be schedule for eye exam - - - -   FALL RISK PREVENTION PERTAINING TO HOME: Is your home free of loose throw rugs in walkways, pet beds, electrical cords, etc? Yes Is  there adequate lighting in your home to reduce risk of falls?  Yes Are there stairs in or around your home WITH handrails? Yes  ASSISTIVE DEVICES UTILIZED TO PREVENT FALLS: Use of a cane, walker or w/c? No Grab bars in the bathroom? Yes  Shower chair or a place to sit while bathing? No An elevated toilet seat or a handicapped toilet? No  Timed Get Up and Go Performed: Yes. Pt ambulated 10 feet within 8 sec. Gait slow, steady and without the use of an assistive device. No intervention required at this time. Fall risk prevention has been discussed.  Community Resource Referral:  Pt declined my offer to send [employer] Referral to Care Guide for a shower chair or an elevated toilet seat.  Depression Screen PHQ 2/9 Scores 03/30/2018 12/14/2017 11/01/2017 03/21/2017  PHQ - 2 Score 0 - 0 0  PHQ- 9 Score 0 - - -  Exception Documentation - Patient refusal - -     Cognitive Function     6CIT Screen 03/30/2018  What Year? 0 points  What month? 0 points  What time? 0 points  Count back from 20 0 points  Months in reverse 0 points  Repeat phrase 2 points  Total Score 2    Immunization History  Administered Date(s) Administered  . Influenza, High Dose Seasonal PF 06/05/2015, 05/12/2017  . Influenza-Unspecified 06/29/2016, 05/12/2017  . Pneumococcal Conjugate-13 07/17/2014  . Pneumococcal Polysaccharide-23 11/24/2015    Qualifies for Shingles Vaccine? Yes. Due for Shingrix. Education has been provided regarding the importance of this vaccine. Pt has been advised to call her insurance company to determine her out of pocket expense. Advised she may also receive this vaccine at her local pharmacy or Health Dept. Verbalized acceptance and understanding.  Due for Tdap vaccine. Education has been provided regarding the importance of this vaccine. Pt has been advised she may receive this vaccine at her local pharmacy or Health Dept. Also advised to provide a copy of her vaccination record  if she chooses to receive this vaccine at her  local pharmacy. Verbalized acceptance and understanding.  Screening Tests Health Maintenance  Topic Date Due  . MAMMOGRAM  03/31/2019 (Originally 05/28/1996)  . DEXA SCAN  03/31/2019 (Originally 05/29/2011)  . COLONOSCOPY  06/27/2019 (Originally 05/28/1996)  . INFLUENZA VACCINE  04/20/2018  . Hepatitis C Screening  Completed  . PNA vac Low Risk Adult  Completed  . TETANUS/TDAP  Discontinued    Cancer Screenings: Lung: Low Dose CT Chest recommended if Age 39-80 years, 30 pack-year currently smoking OR have quit w/in 15years. Patient does not qualify. Breast:  Up to date on Mammogram? No. Denies having ever completed mammography. No reports found. Offered to order this screening but adamantly declined. Education provided re: importance of this screening but still declined.   Up to date of Bone Density/Dexa? No. Denies having ever completed osteoporotic screenings. No reports found. Offered to order this screening but adamantly declined. Education provided re: importance of this screening but still declined.   Colorectal: Denies having ever completed colorectal screenings. No reports found. Offered to order this screening but adamantly declined. Education provided re: importance of this screening but still declined.    Additional Screenings: Hepatitis C Screening: Completed 09/03/12    Plan:  I have personally reviewed and addressed the Medicare Annual Wellness questionnaire and have noted the following in the patient's chart:  A. Medical and social history B. Use of alcohol, tobacco or illicit drugs  C. Current medications and supplements D. Functional ability and status E.  Nutritional status F.  Physical activity G. Advance directives H. List of other physicians I.  Hospitalizations, surgeries, and ER visits in previous 12 months J.  [place] such as hearing and vision if needed, cognitive and depression L. Referrals and  appointments  In addition, I have reviewed and discussed with patient certain preventive protocols, quality metrics, and best practice recommendations. A written personalized care plan for preventive services as well as general preventive health recommendations were provided to patient.  See attached scanned questionnaire for additional information.   Signed,  Aleatha Borer, LPN Nurse Health Advisor  I have reviewed this encounter including the documentation in this note and/or discussed this patient with the provider, Aleatha Borer, LPN. I am certifying that I agree with the content of this note as supervising physician.  Steele Sizer, MD [facility] 03/31/2018, 1:02 PM

## 2018-03-30 NOTE — Telephone Encounter (Signed)
Pt seen today for AWV. Requesting refill of her Levothyroxine. Please advise. Medications reconciled and pharmacy of choice has been confirmed and updated in chart.

## 2018-03-31 NOTE — Telephone Encounter (Signed)
Refill request for thyroid medication.  Last visit:  12/14/2017   Lab Results  Component Value Date   TSH 0.84 12/14/2017    Follow up on 06/16/18

## 2018-03-31 NOTE — Addendum Note (Signed)
Addended by: Vonna Kotyk L on: 03/31/2018 10:00 AM   Modules accepted: Orders

## 2018-04-03 ENCOUNTER — Other Ambulatory Visit: Payer: Self-pay | Admitting: Family Medicine

## 2018-04-03 DIAGNOSIS — E890 Postprocedural hypothyroidism: Secondary | ICD-10-CM

## 2018-04-03 MED ORDER — LEVOTHYROXINE SODIUM 75 MCG PO TABS: 75.0000 ug | ORAL_TABLET | Freq: Every day | ORAL | 6 refills | Status: DC

## 2018-06-16 ENCOUNTER — Encounter: Payer: Self-pay | Admitting: Family Medicine

## 2018-06-16 ENCOUNTER — Ambulatory Visit (INDEPENDENT_AMBULATORY_CARE_PROVIDER_SITE_OTHER): Payer: Medicare Other | Admitting: Family Medicine

## 2018-06-16 VITALS — BP 138/82 | HR 88 | Temp 98.3°F | Resp 16 | Ht 65.0 in | Wt 185.0 lb

## 2018-06-16 DIAGNOSIS — N183 Chronic kidney disease, stage 3 unspecified: Secondary | ICD-10-CM

## 2018-06-16 DIAGNOSIS — I10 Essential (primary) hypertension: Secondary | ICD-10-CM

## 2018-06-16 DIAGNOSIS — E890 Postprocedural hypothyroidism: Secondary | ICD-10-CM

## 2018-06-16 DIAGNOSIS — D638 Anemia in other chronic diseases classified elsewhere: Secondary | ICD-10-CM | POA: Diagnosis not present

## 2018-06-16 DIAGNOSIS — Z23 Encounter for immunization: Secondary | ICD-10-CM

## 2018-06-16 DIAGNOSIS — Z9861 Coronary angioplasty status: Secondary | ICD-10-CM

## 2018-06-16 DIAGNOSIS — I700 Atherosclerosis of aorta: Secondary | ICD-10-CM

## 2018-06-16 DIAGNOSIS — E871 Hypo-osmolality and hyponatremia: Secondary | ICD-10-CM

## 2018-06-16 DIAGNOSIS — F325 Major depressive disorder, single episode, in full remission: Secondary | ICD-10-CM

## 2018-06-16 LAB — CBC WITH DIFFERENTIAL/PLATELET
BASOS ABS: 39 {cells}/uL (ref 0–200)
Basophils Relative: 0.8 %
EOS PCT: 3.9 %
Eosinophils Absolute: 191 cells/uL (ref 15–500)
HCT: 30.9 % — ABNORMAL LOW (ref 35.0–45.0)
HEMOGLOBIN: 10.1 g/dL — AB (ref 11.7–15.5)
Lymphs Abs: 1622 cells/uL (ref 850–3900)
MCH: 30.1 pg (ref 27.0–33.0)
MCHC: 32.7 g/dL (ref 32.0–36.0)
MCV: 92.2 fL (ref 80.0–100.0)
MONOS PCT: 8.7 %
MPV: 9.5 fL (ref 7.5–12.5)
NEUTROS ABS: 2622 {cells}/uL (ref 1500–7800)
Neutrophils Relative %: 53.5 %
Platelets: 284 10*3/uL (ref 140–400)
RBC: 3.35 10*6/uL — AB (ref 3.80–5.10)
RDW: 12.2 % (ref 11.0–15.0)
Total Lymphocyte: 33.1 %
WBC mixed population: 426 cells/uL (ref 200–950)
WBC: 4.9 10*3/uL (ref 3.8–10.8)

## 2018-06-16 LAB — COMPLETE METABOLIC PANEL WITH GFR
AG Ratio: 1.6 (calc) (ref 1.0–2.5)
ALBUMIN MSPROF: 3.7 g/dL (ref 3.6–5.1)
ALKALINE PHOSPHATASE (APISO): 90 U/L (ref 33–130)
ALT: 9 U/L (ref 6–29)
AST: 16 U/L (ref 10–35)
BUN / CREAT RATIO: 9 (calc) (ref 6–22)
BUN: 13 mg/dL (ref 7–25)
CALCIUM: 9.2 mg/dL (ref 8.6–10.4)
CHLORIDE: 101 mmol/L (ref 98–110)
CO2: 26 mmol/L (ref 20–32)
Creat: 1.39 mg/dL — ABNORMAL HIGH (ref 0.60–0.93)
GFR, Est African American: 44 mL/min/{1.73_m2} — ABNORMAL LOW (ref >=60)
GFR, Est Non African American: 38 mL/min/{1.73_m2} — ABNORMAL LOW (ref >=60)
GLUCOSE: 114 mg/dL (ref 65–139)
Globulin: 2.3 g/dL (calc) (ref 1.9–3.7)
POTASSIUM: 4.1 mmol/L (ref 3.5–5.3)
SODIUM: 135 mmol/L (ref 135–146)
Total Bilirubin: 0.4 mg/dL (ref 0.2–1.2)
Total Protein: 6 g/dL — ABNORMAL LOW (ref 6.1–8.1)

## 2018-06-16 LAB — LIPID PANEL
CHOLESTEROL: 323 mg/dL — AB (ref <200)
HDL: 61 mg/dL (ref >50)
LDL Cholesterol (Calc): 226 mg/dL (calc) — ABNORMAL HIGH
Non-HDL Cholesterol (Calc): 262 mg/dL (calc) — ABNORMAL HIGH (ref <130)
Total CHOL/HDL Ratio: 5.3 (calc) — ABNORMAL HIGH (ref <5.0)
Triglycerides: 187 mg/dL — ABNORMAL HIGH (ref <150)

## 2018-06-16 LAB — TSH: TSH: 0.62 mIU/L (ref 0.40–4.50)

## 2018-06-16 MED ORDER — CLONIDINE HCL 0.1 MG PO TABS: 0.1000 mg | ORAL_TABLET | Freq: Every day | ORAL | 0 refills | Status: DC

## 2018-06-16 NOTE — Progress Notes (Signed)
Name: Betty Nielsen   MRN: [id number]    DOB: 1946/01/08   Date:06/16/2018       Progress Note  Subjective  Chief Complaint  Chief Complaint  Patient presents with  . Follow-up    6 mth f/u  . Hypertension  . Chronic Kidney Disease  . Coronary Artery Disease  . PMR  . Hyponatremia  . Atherosclerosis  . Hyperlipidemia  . Hypothyroidism  . Gastroesophageal Reflux  . Depression  . Malnutrition  . Medication Refill    levothyroxine  . Sinus Problem    patient presents with sinus pressure since last week. some post nasal drainage  . Arthritis    HPI  Hyponatremia: she had a syncopal episode 02/23/2017, she was found to have high bp and low sodium. She has been seeing Dr. Abigail Butts since, she has been off SSRI and diuretics.  She is willing to have labs done today   CKI III/ Hyponatremia: stable kidney function Good urine output, she denies pruritus, no nausea or vomiting. Urine micro done at Dr. Abigail Butts showed macroalbuminuria  HTN/CAD/Angina: doing well bp is at goal, she denies sob with activity, she is back on clonidine BID but states.   She has not used any NTG since Feb 2018 ( when she had MI).She is off plavix, still taking aspirin daily, denies easy bruising  WGN:FAOZ by Dr. Jefm Bryant, took prednisone but is off medication, she states she continues to have daily pain and stiffiness advised her to go back to see him   Atherosclerosis abdominal aorta/hyperlipidemia: unable to tolerate statin,currently on 81 mg aspirin she denies easy bleeding. She also has a history of aneurysmal of aorta repair done by Dr. Doren Custard ( vascular surgeon in [place]). We will recheck labs today   Hyperlipidemia: LDL dangerously high, but patient is not willing to try statin, too scared of side effects, she has a history of MI and still not willing. She also has atherosclerosis of abdominal aorta, discussed risk of strokes and heart attacks, explained we have other agents besides statins, (  including Zetia ) to improve LDL. Advised labs and she is willing to have it done today   Hypothyroidism: she is s/p thyroidectomy was having labs done by Dr. Pryor Ochoa, Compliant with medication, denies dry skin, states constipation has been controlled with Miralax. Last TSHwas at goal, she is taking Synthroid 75 mcg daily and we will recheck labs today.  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 She denies sadness, crying spells, change in appetite, or suicidal thoughts. Normal phq9    Patient Active Problem List   Diagnosis Date Noted  . Malnutrition of mild degree ([place]) 06/16/2017  . PMR (polymyalgia rheumatica) ([place]) 06/15/2017  . Elevated troponin 10/27/2016  . Essential hypertension, malignant 10/27/2016  . Headache 10/27/2016  . CKD (chronic kidney disease), stage III ([place]) 10/27/2016  . Hyperlipidemia 10/27/2016  . Hyponatremia 10/27/2016  . Angina pectoris ([place]) 10/25/2016  . History of non-ST elevation myocardial infarction (NSTEMI) 10/25/2016  . History of coronary angioplasty 10/25/2016  . BPV (benign positional vertigo) 06/15/2016  . 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[place]) 11/21/2015  . Anemia of chronic disease 06/05/2015  . Acid reflux 06/05/2015  . Hypertension, benign 06/05/2015  . Hypothyroidism, 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  . Major depression in remission ([place]) 06/05/2015  . Obesity (BMI 30.0-34.9) 06/05/2015  . Neuropathy 06/05/2015  . Arthritis, degenerative 07/03/2014  . AAA (abdominal aortic aneurysm)  without rupture ([place]) 06/26/2014    Past Surgical History:  Procedure Laterality Date  . ABDOMINAL AORTIC ANEURYSM REPAIR    . ABDOMINAL HYSTERECTOMY    . APPENDECTOMY    . CORONARY STENT INTERVENTION N/A 10/26/2016   Procedure: Coronary Stent Intervention;  Surgeon: Yolonda Kida, MD;  Location: [facility];  Service: Cardiovascular;  Laterality:  N/A;  . HERNIA REPAIR    . INNER EAR SURGERY     pt not sure of type  . LEFT HEART CATH AND CORONARY ANGIOGRAPHY N/A 10/26/2016   Procedure: Left Heart Cath and Coronary Angiography;  Surgeon: Teodoro Spray, MD;  Location: [facility];  Service: Cardiovascular;  Laterality: N/A;  . THYROIDECTOMY      Family History  Problem Relation Age of Onset  . Healthy Mother   . Cerebral aneurysm Father     Social History   Socioeconomic History  . Marital status: Widowed    Spouse name: Not on file  . Number of children: 1  . Years of education: GED  . Highest education level: 10th grade  Occupational History  . Occupation: Retired  Scientific laboratory technician  . Financial resource strain: Not hard at all  . Food insecurity:    Worry: Never true    Inability: Never true  . Transportation needs:    Medical: No    Non-medical: No  Tobacco Use  . Smoking status: Former Smoker    Packs/day: 0.50    Years: 20.00    Pack years: 10.00    Types: Cigarettes    Last attempt to quit: 2000    Years since quitting: 19.7  . Smokeless tobacco: Never Used  . Tobacco comment: smoking cessation materials not required  Substance and Sexual Activity  . Alcohol use: No    Alcohol/week: 0.0 standard drinks  . Drug use: No  . Sexual activity: Not Currently  Lifestyle  . Physical activity:    Days per week: 5 days    Minutes per session: 120 min  . Stress: Not at all  Relationships  . Social connections:    Talks on phone: More than three times a week    Gets together: Once a week    Attends religious service: More than 4 times per year    Active member of club or organization: Patient refused    Attends meetings of clubs or organizations: Patient refused    Relationship status: Widowed  . Intimate partner violence:    Fear of current or ex partner: No    Emotionally abused: No    Physically abused: No    Forced sexual activity: No  Other Topics Concern  . Not on file  Social History  Narrative   Lives alone   Retired from working a mill      Current Outpatient Medications:  .  acetaminophen (TYLENOL) 500 MG tablet, Take 500 mg by mouth every 6 (six) hours as needed., Disp: , Rfl:  .  aspirin 81 MG tablet, Take 81 mg by mouth daily., Disp: , Rfl:  .  cetirizine (ZYRTEC) 10 MG tablet, Take 1 tablet (10 mg total) by mouth daily., Disp: 30 tablet, Rfl: 0 .  Garlic 0254 MG CAPS, Take by mouth., Disp: , Rfl:  .  irbesartan (AVAPRO) 300 MG tablet, Take 1 tablet (300 mg total) by mouth daily., Disp: 90 tablet, Rfl: 1 .  Iron-Vitamins (GERITOL COMPLETE) TABS, Take by mouth., Disp: , Rfl:  .  labetalol (NORMODYNE) 200  MG tablet, Take 1 tablet by mouth 2 (two) times daily., Disp: , Rfl:  .  levothyroxine (SYNTHROID, LEVOTHROID) 75 MCG tablet, Take 1 tablet (75 mcg total) by mouth daily before breakfast., Disp: 30 tablet, Rfl: 6 .  MULTIPLE VITAMIN PO, Take 1 capsule by mouth daily., Disp: , Rfl:  .  Omega-3 Fatty Acids (FISH OIL) 1000 MG CPDR, Take 2 capsules by mouth daily., Disp: , Rfl:  .  Cholecalciferol (VITAMIN D-1000 MAX ST) 1000 units tablet, Take by mouth., Disp: , Rfl:  .  cloNIDine (CATAPRES) 0.1 MG tablet, Take 1 tablet (0.1 mg total) by mouth at bedtime., Disp: 90 tablet, Rfl: 0 .  nitroGLYCERIN (NITROSTAT) 0.4 MG SL tablet, Place 1 tablet (0.4 mg total) under the tongue every 5 (five) minutes as needed for chest pain., Disp: 20 tablet, Rfl: 12  Allergies  Allergen Reactions  . Ace Inhibitors Cough  . Ciprofloxacin Other (See Comments)  . Hydrocodone-Acetaminophen Other (See Comments)  . Norvasc [Amlodipine Besylate]     Pruritus   . Pantoprazole Sodium Other (See Comments)    Cramps  . Pravastatin Other (See Comments)    Stomach cramps  . Sulfa Antibiotics     I personally reviewed active problem list, medication list, allergies, family history, social history with the patient/caregiver today.   ROS  Constitutional: Negative for fever or significant  weight change.  Respiratory: Negative for cough and shortness of breath.   Cardiovascular: Negative for chest pain or palpitations.  Gastrointestinal: Negative for abdominal pain, no bowel changes.  Musculoskeletal: Negative for gait problem or joint swelling.  Skin: Negative for rash.  Neurological: Negative for dizziness or headache.  No other specific complaints in a complete review of systems (except as listed in HPI above).  Objective  Vitals:   06/16/18 1336  BP: 138/82  Pulse: 88  Resp: 16  Temp: 98.3 F (36.8 C)  TempSrc: Oral  SpO2: 97%  Weight: 185 lb (83.9 kg)  Height: 5\' 5"  (1.651 m)    Body mass index is 30.79 kg/m.  Physical Exam  Constitutional: Patient appears well-developed and well-nourished. Obese  No distress.  HEENT: head atraumatic, normocephalic, pupils equal and reactive to light, neck supple, throat within normal limits Cardiovascular: Normal rate, regular rhythm and normal heart sounds.  No murmur heard. No BLE edema. Pulmonary/Chest: Effort normal and breath sounds normal. No respiratory distress. Abdominal: Soft.  There is no tenderness. Psychiatric: Patient has a normal mood and affect. behavior is normal. Judgment and thought content normal.  PHQ2/9: Depression screen [facility] 2/9 06/16/2018 03/30/2018 11/01/2017 03/21/2017 12/06/2016  Decreased Interest 0 0 0 0 0  Down, Depressed, Hopeless 0 0 0 0 0  PHQ - 2 Score 0 0 0 0 0  Altered sleeping 0 0 - - 0  Tired, decreased energy 0 0 - - 1  Change in appetite 0 0 - - 0  Feeling bad or failure about yourself  0 0 - - 0  Trouble concentrating 0 0 - - 0  Moving slowly or fidgety/restless 0 0 - - 0  Suicidal thoughts 0 0 - - 0  PHQ-9 Score 0 0 - - 1  Difficult doing work/chores - Not difficult at all - - Not difficult at all    Fall Risk: Fall Risk  06/16/2018 03/30/2018 12/14/2017 11/01/2017 06/15/2017  Falls in the past year? No No No No No  Risk for fall due to : - Impaired vision - - -  Risk for fall  due to: Comment - wears eyeglasses but unable to see out of them. Refused referral to be schedule for eye exam - - -     Functional Status Survey: Is the patient deaf or have difficulty hearing?: No Does the patient have difficulty seeing, even when wearing glasses/contacts?: No Does the patient have difficulty concentrating, remembering, or making decisions?: No Does the patient have difficulty walking or climbing stairs?: No Does the patient have difficulty dressing or bathing?: No Does the patient have difficulty doing errands alone such as visiting a doctor's office or shopping?: No    Assessment & Plan  1. 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HCC)  - Lipid panel  2. Needs flu shot  Refused today   3. CKD (chronic kidney disease), stage III (HCC)  - CBC with Differential/Platelet - COMPLETE METABOLIC PANEL WITH GFR  4. Anemia of chronic disease  - CBC with Differential/Platelet  5. Hyponatremia   6. History of coronary angioplasty  No recent episodes of chest pain  7. Hypertension, benign  - CBC with Differential/Platelet  8. Hypothyroidism, postsurgical  - TSH  9. Major depression in remission [facility])  Doing well

## 2018-08-06 ENCOUNTER — Other Ambulatory Visit: Payer: Self-pay

## 2018-08-06 ENCOUNTER — Emergency Department
Admission: EM | Admit: 2018-08-06 | Discharge: 2018-08-06 | Disposition: A | Discharge status: Home / Self Care | Payer: Medicare Other | Attending: Emergency Medicine | Admitting: Emergency Medicine

## 2018-08-06 DIAGNOSIS — Z79899 Other long term (current) drug therapy: Secondary | ICD-10-CM | POA: Diagnosis not present

## 2018-08-06 DIAGNOSIS — Z87891 Personal history of nicotine dependence: Secondary | ICD-10-CM | POA: Insufficient documentation

## 2018-08-06 DIAGNOSIS — Z7982 Long term (current) use of aspirin: Secondary | ICD-10-CM | POA: Diagnosis not present

## 2018-08-06 DIAGNOSIS — F419 Anxiety disorder, unspecified: Secondary | ICD-10-CM | POA: Insufficient documentation

## 2018-08-06 DIAGNOSIS — N183 Chronic kidney disease, stage 3 (moderate): Secondary | ICD-10-CM | POA: Diagnosis not present

## 2018-08-06 DIAGNOSIS — E039 Hypothyroidism, unspecified: Secondary | ICD-10-CM | POA: Insufficient documentation

## 2018-08-06 DIAGNOSIS — I252 Old myocardial infarction: Secondary | ICD-10-CM | POA: Diagnosis not present

## 2018-08-06 DIAGNOSIS — I129 Hypertensive chronic kidney disease with stage 1 through stage 4 chronic kidney disease, or unspecified chronic kidney disease: Secondary | ICD-10-CM | POA: Insufficient documentation

## 2018-08-06 MED ORDER — CLONAZEPAM 0.5 MG PO TABS: 0.2500 mg | ORAL_TABLET | Freq: Two times a day (BID) | ORAL | 0 refills | Status: DC | PRN

## 2018-08-06 NOTE — ED Triage Notes (Signed)
Pt arrived via POV with reports of having a lot of stress and feeling "stretched out" for the past 3 weeks, pt states she has been like this in the past about 10 years ago when her husband passed. Pt states she is not currently taking any medication for anxiety.  Pt took BP medication just PTA.  Pt denies any SI/HI.  Pt states she just feels nervous. Pt states things keep going wrong at the house.

## 2018-08-06 NOTE — ED Provider Notes (Signed)
[facility] Emergency Department Provider Note  ____________________________________________  Time seen: Approximately 12:59 PM  I have reviewed the triage vital signs and the nursing notes.   HISTORY  Chief Complaint Anxiety    HPI Betty Nielsen is a 72 y.o. female with a history of anxiety, hypertension, CKD who reports stress and anxiety for the past 3 weeks.  It feels like anxiety that she had 10 years ago when her husband died.  She reports multiple stressors recently related to her friends.  She denies any pain palpitations dizziness syncope vomiting or diarrhea.  Just a unsettled queasy feeling that comes and goes.  No exertional symptoms.  No fevers chills sweats or weight changes.  She is compliant with all her medications.  She came to the walk-in primary care clinic at [facility] today, but when she got here she found out that the clinic does not open until 1:00 PM and for that reason alone she came to the emergency room.  She does not believe that she has a serious illness, and says she just wants something for her anxiety so she go home.      Past Medical History:  Diagnosis Date  . Arthrosis of knee   . Chronic kidney disease   . Hyperlipidemia   . Hypertension   . Loss of hearing   . Metabolic syndrome   . Plantar fasciitis      Patient Active Problem List   Diagnosis Date Noted  . Malnutrition of mild degree ([place]) 06/16/2017  . PMR (polymyalgia rheumatica) ([place]) 06/15/2017  . Elevated troponin 10/27/2016  . Essential hypertension, malignant 10/27/2016  . Headache 10/27/2016  . CKD (chronic kidney disease), stage III ([place]) 10/27/2016  . Hyperlipidemia 10/27/2016  . Hyponatremia 10/27/2016  . Angina pectoris ([place]) 10/25/2016  . History of non-ST elevation myocardial infarction (NSTEMI) 10/25/2016  . History of coronary angioplasty 10/25/2016  . BPV (benign positional vertigo) 06/15/2016  . Atherosclerosis of abdominal  aorta ([place]) 11/21/2015  . Anemia of chronic disease 06/05/2015  . Acid reflux 06/05/2015  . Hypertension, benign 06/05/2015  . Hypothyroidism, 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  . Major depression in remission ([place]) 06/05/2015  . Obesity (BMI 30.0-34.9) 06/05/2015  . Neuropathy 06/05/2015  . Arthritis, degenerative 07/03/2014  . AAA (abdominal aortic aneurysm) without rupture ([place]) 06/26/2014     Past Surgical History:  Procedure Laterality Date  . ABDOMINAL AORTIC ANEURYSM REPAIR    . ABDOMINAL HYSTERECTOMY    . APPENDECTOMY    . CORONARY STENT INTERVENTION N/A 10/26/2016   Procedure: Coronary Stent Intervention;  Surgeon: Yolonda Kida, MD;  Location: [place];  Service: Cardiovascular;  Laterality: N/A;  . HERNIA REPAIR    . INNER EAR SURGERY     pt not sure of type  . LEFT HEART CATH AND CORONARY ANGIOGRAPHY N/A 10/26/2016   Procedure: Left Heart Cath and Coronary Angiography;  Surgeon: Teodoro Spray, MD;  Location: [place] [facility];  Service: Cardiovascular;  Laterality: N/A;  . THYROIDECTOMY       Prior to Admission medications   Medication Sig Start Date End Date Taking? Authorizing Provider  acetaminophen (TYLENOL) 500 MG tablet Take 500 mg by mouth every 6 (six) hours as needed.   Yes [provider]  aspirin 81 MG tablet Take 81 mg by mouth daily.   Yes [provider]  cetirizine (ZYRTEC) 10 MG tablet Take 1 tablet (10 mg total) by mouth daily. Patient taking differently: Take 10 mg  by mouth 2 (two) times daily.  11/01/17  Yes Sowles, Drue Stager, MD  Cholecalciferol (VITAMIN D-1000 MAX ST) 1000 units tablet Take by mouth.   Yes [provider]  cloNIDine (CATAPRES) 0.1 MG tablet Take 1 tablet (0.1 mg total) by mouth at bedtime. Patient taking differently: Take 0.2 mg by mouth at bedtime.  06/16/18  Yes Steele Sizer, MD  Garlic 1610 MG CAPS Take by mouth.   Yes [provider]  irbesartan (AVAPRO) 300 MG tablet Take 1 tablet  (300 mg total) by mouth daily. 12/14/16  Yes Sowles, Drue Stager, MD  Iron-Vitamins (GERITOL COMPLETE) TABS Take by mouth.   Yes [provider]  labetalol (NORMODYNE) 100 MG tablet Take 1 tablet by mouth 2 (two) times daily.  10/27/17  Yes Callwood, Dwayne D, MD  levothyroxine (SYNTHROID, LEVOTHROID) 75 MCG tablet Take 1 tablet (75 mcg total) by mouth daily before breakfast. 04/03/18  Yes Sowles, Drue Stager, MD  MULTIPLE VITAMIN PO Take 1 capsule by mouth daily.   Yes [provider]  nitroGLYCERIN (NITROSTAT) 0.4 MG SL tablet Place 1 tablet (0.4 mg total) under the tongue every 5 (five) minutes as needed for chest pain. 10/27/16  Yes Theodoro Grist, MD  Omega-3 Fatty Acids (FISH OIL) 1000 MG CPDR Take 2 capsules by mouth daily.   Yes [provider]  clonazePAM (KLONOPIN) 0.5 MG tablet Take 0.5 tablets (0.25 mg total) by mouth 2 (two) times daily as needed for anxiety. 08/06/18 08/06/19  Carrie Mew, MD     Allergies Ace inhibitors; Ciprofloxacin; Hydrocodone-acetaminophen; Norvasc [amlodipine besylate]; Pantoprazole sodium; Pravastatin; and Sulfa antibiotics   Family History  Problem Relation Age of Onset  . Healthy Mother   . Cerebral aneurysm Father     Social History Social History   Tobacco Use  . Smoking status: Former Smoker    Packs/day: 0.50    Years: 20.00    Pack years: 10.00    Types: Cigarettes    Last attempt to quit: 2000    Years since quitting: 19.8  . Smokeless tobacco: Never Used  . Tobacco comment: smoking cessation materials not required  Substance Use Topics  . Alcohol use: No    Alcohol/week: 0.0 standard drinks  . Drug use: No    Review of Systems  Constitutional:   No fever or chills.  ENT:   No sore throat. No rhinorrhea. Cardiovascular:   No chest pain or syncope. Respiratory:   No dyspnea or cough. Gastrointestinal:   Negative for abdominal pain, vomiting and diarrhea.  Musculoskeletal:   Negative for focal pain or  swelling All other systems reviewed and are negative except as documented above in ROS and HPI.  ____________________________________________   PHYSICAL EXAM:  VITAL SIGNS: ED Triage Vitals  Enc Vitals Group     BP 08/06/18 1000 (!) 209/94     Pulse Rate 08/06/18 1000 65     Resp 08/06/18 1000 18     Temp 08/06/18 1000 97.9 F (36.6 C)     Temp Source 08/06/18 1000 Oral     SpO2 08/06/18 1000 100 %     Weight 08/06/18 1004 185 lb (83.9 kg)     Height 08/06/18 1004 5\' 3"  (1.6 m)     Head Circumference --      Peak Flow --      Pain Score 08/06/18 1004 0     Pain Loc --      Pain Edu? --      Excl. in [place]? --  Vital signs reviewed, nursing assessments reviewed.   Constitutional:   Alert and oriented. Non-toxic appearance. Eyes:   Conjunctivae are normal. EOMI. PERRL. ENT      Head:   Normocephalic and atraumatic.      Nose:   No congestion/rhinnorhea.       Mouth/Throat:   MMM, no pharyngeal erythema. No peritonsillar mass.       Neck:   No meningismus. Full ROM.  Thyroid nonpalpable, nontender Hematological/Lymphatic/Immunilogical:   No cervical lymphadenopathy. Cardiovascular:   RRR. Symmetric bilateral radial and DP pulses.  No murmurs. Cap refill less than 2 seconds. Respiratory:   Normal respiratory effort without tachypnea/retractions. Breath sounds are clear and equal bilaterally. No wheezes/rales/rhonchi. Gastrointestinal:   Soft and nontender. Non distended. There is no CVA tenderness.  No rebound, rigidity, or guarding. Musculoskeletal:   Normal range of motion in all extremities. No joint effusions.  No lower extremity tenderness.  No edema. Neurologic:   Normal speech and language.  Motor grossly intact. No acute focal neurologic deficits are appreciated.  Skin:    Skin is warm, dry and intact. No rash noted.  No petechiae, purpura, or bullae.  ____________________________________________    LABS (pertinent positives/negatives) (all labs ordered are  listed, but only abnormal results are displayed) Labs Reviewed - No data to display ____________________________________________   EKG    ____________________________________________    RADIOLOGY  No results found.  ____________________________________________   PROCEDURES Procedures  ____________________________________________    CLINICAL IMPRESSION / ASSESSMENT AND PLAN / ED COURSE  Pertinent labs & imaging results that were available during my care of the patient were reviewed by me and considered in my medical decision making (see chart for details).    Patient presents with symptoms of anxiety.  No other acute symptoms.  By her account she feels well and is not concerned about any acute illness.  Exam is unremarkable.  Controlled substance reporting system reviewed.  She is already on a beta-blocker.  Antihistamines are risky, so I will use a very low-dose Klonopin, very limited prescription until she can follow-up with her doctor for further evaluation of the symptoms.      ____________________________________________   FINAL CLINICAL IMPRESSION(S) / ED DIAGNOSES    Final diagnoses:  Anxiety     ED Discharge Orders         Ordered    clonazePAM (KLONOPIN) 0.5 MG tablet  2 times daily PRN     08/06/18 1259          Portions of this note were generated with dragon dictation software. Dictation errors may occur despite best attempts at proofreading.    Carrie Mew, MD 08/06/18 1311

## 2018-08-08 ENCOUNTER — Ambulatory Visit: Payer: Medicare Other | Admitting: Family Medicine

## 2018-08-10 ENCOUNTER — Ambulatory Visit (INDEPENDENT_AMBULATORY_CARE_PROVIDER_SITE_OTHER): Payer: Medicare Other | Admitting: Family Medicine

## 2018-08-10 ENCOUNTER — Encounter: Payer: Self-pay | Admitting: Family Medicine

## 2018-08-10 VITALS — BP 130/84 | HR 72 | Temp 97.7°F | Resp 16 | Ht 65.0 in | Wt 187.0 lb

## 2018-08-10 DIAGNOSIS — K0889 Other specified disorders of teeth and supporting structures: Secondary | ICD-10-CM

## 2018-08-10 DIAGNOSIS — I10 Essential (primary) hypertension: Secondary | ICD-10-CM

## 2018-08-10 DIAGNOSIS — F410 Panic disorder [episodic paroxysmal anxiety] without agoraphobia: Secondary | ICD-10-CM

## 2018-08-10 MED ORDER — CITALOPRAM HYDROBROMIDE 10 MG PO TABS: 10.0000 mg | ORAL_TABLET | Freq: Every day | ORAL | 1 refills | Status: DC

## 2018-08-10 MED ORDER — HYDROXYZINE HCL 10 MG PO TABS: 10.0000 mg | ORAL_TABLET | Freq: Every day | ORAL | 0 refills | Status: DC | PRN

## 2018-08-10 NOTE — Progress Notes (Signed)
Name: Betty Nielsen   MRN: [id number]    DOB: 07/08/1946   Date:08/10/2018       Progress Note  Subjective  Chief Complaint  Chief Complaint  Patient presents with  . Follow-up    ER recheck    HPI  Anxiety/panic attack: she states she had an episode like that when her husband died. She woke up on 12/28/2022 morning feeling nervous, nausea, palpitation. No fever, chills. She drove to Urgent Care but they were closed so she walked in to the [facility]. BP was super high 209/94, she was given clonazepam and she has been taking half pill BID, she states she has been feeling fine since. Discussed importance of taking clonazepam only prn, we will try hydroxyzine instead, discussed risk of falls. She is willing to take SSRI. She states one of her friends died a few weeks ago and she misses her.   HTN: bp is at goal today, taking medications, no chest pain or palpitation   Patient Active Problem List   Diagnosis Date Noted  . Malnutrition of mild degree ([place]) 06/16/2017  . PMR (polymyalgia rheumatica) ([place]) 06/15/2017  . Elevated troponin 10/27/2016  . Essential hypertension, malignant 10/27/2016  . Headache 10/27/2016  . CKD (chronic kidney disease), stage III ([place]) 10/27/2016  . Hyperlipidemia 10/27/2016  . Hyponatremia 10/27/2016  . Angina pectoris ([employer]) 10/25/2016  . History of non-ST elevation myocardial infarction (NSTEMI) 10/25/2016  . History of coronary angioplasty 10/25/2016  . BPV (benign positional vertigo) 06/15/2016  . 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[place]) 11/21/2015  . Anemia of chronic disease 06/05/2015  . Acid reflux 06/05/2015  . Hypertension, benign 06/05/2015  . Hypothyroidism, 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  . Major depression in remission ([place]) 06/05/2015  . Obesity (BMI 30.0-34.9) 06/05/2015  . Neuropathy 06/05/2015  . Arthritis, degenerative 07/03/2014  . AAA (abdominal aortic aneurysm) without rupture ([place]) 06/26/2014    Past Surgical History:  Procedure Laterality  Date  . ABDOMINAL AORTIC ANEURYSM REPAIR    . ABDOMINAL HYSTERECTOMY    . APPENDECTOMY    . CORONARY STENT INTERVENTION N/A 10/26/2016   Procedure: Coronary Stent Intervention;  Surgeon: Yolonda Kida, MD;  Location: [facility];  Service: Cardiovascular;  Laterality: N/A;  . HERNIA REPAIR    . INNER EAR SURGERY     pt not sure of type  . LEFT HEART CATH AND CORONARY ANGIOGRAPHY N/A 10/26/2016   Procedure: Left Heart Cath and Coronary Angiography;  Surgeon: Teodoro Spray, MD;  Location: [facility];  Service: Cardiovascular;  Laterality: N/A;  . THYROIDECTOMY      Family History  Problem Relation Age of Onset  . Healthy Mother   . Cerebral aneurysm Father     Social History   Socioeconomic History  . Marital status: Widowed    Spouse name: Not on file  . Number of children: 1  . Years of education: GED  . Highest education level: 10th grade  Occupational History  . Occupation: Retired  Scientific laboratory technician  . Financial resource strain: Not hard at all  . Food insecurity:    Worry: Never true    Inability: Never true  . Transportation needs:    Medical: No    Non-medical: No  Tobacco Use  . Smoking status: Former Smoker    Packs/day: 0.50    Years: 20.00    Pack years: 10.00    Types: Cigarettes    Last attempt to quit: 2000    Years since quitting:  19.9  . Smokeless tobacco: Never Used  . Tobacco comment: smoking cessation materials not required  Substance and Sexual Activity  . Alcohol use: No    Alcohol/week: 0.0 standard drinks  . Drug use: No  . Sexual activity: Not Currently  Lifestyle  . Physical activity:    Days per week: 5 days    Minutes per session: 120 min  . Stress: Not at all  Relationships  . Social connections:    Talks on phone: More than three times a week    Gets together: Once a week    Attends religious service: More than 4 times per year    Active member of club or organization: Patient refused    Attends meetings of  clubs or organizations: Patient refused    Relationship status: Widowed  . Intimate partner violence:    Fear of current or ex partner: No    Emotionally abused: No    Physically abused: No    Forced sexual activity: No  Other Topics Concern  . Not on file  Social History Narrative   Lives alone   Retired from working a mill      Current Outpatient Medications:  .  acetaminophen (TYLENOL) 500 MG tablet, Take 500 mg by mouth every 6 (six) hours as needed., Disp: , Rfl:  .  aspirin 81 MG tablet, Take 81 mg by mouth daily., Disp: , Rfl:  .  cetirizine (ZYRTEC) 10 MG tablet, Take 1 tablet (10 mg total) by mouth daily. (Patient taking differently: Take 10 mg by mouth 2 (two) times daily. ), Disp: 30 tablet, Rfl: 0 .  Cholecalciferol (VITAMIN D-1000 MAX ST) 1000 units tablet, Take by mouth., Disp: , Rfl:  .  clonazePAM (KLONOPIN) 0.5 MG tablet, Take 0.5 tablets (0.25 mg total) by mouth 2 (two) times daily as needed for anxiety., Disp: 6 tablet, Rfl: 0 .  cloNIDine (CATAPRES) 0.1 MG tablet, Take 1 tablet (0.1 mg total) by mouth at bedtime. (Patient taking differently: Take 0.2 mg by mouth at bedtime. ), Disp: 90 tablet, Rfl: 0 .  Garlic 3810 MG CAPS, Take by mouth., Disp: , Rfl:  .  irbesartan (AVAPRO) 300 MG tablet, Take 1 tablet (300 mg total) by mouth daily., Disp: 90 tablet, Rfl: 1 .  Iron-Vitamins (GERITOL COMPLETE) TABS, Take by mouth., Disp: , Rfl:  .  labetalol (NORMODYNE) 100 MG tablet, Take 1 tablet by mouth 2 (two) times daily. , Disp: , Rfl:  .  levothyroxine (SYNTHROID, LEVOTHROID) 75 MCG tablet, Take 1 tablet (75 mcg total) by mouth daily before breakfast., Disp: 30 tablet, Rfl: 6 .  MULTIPLE VITAMIN PO, Take 1 capsule by mouth daily., Disp: , Rfl:  .  nitroGLYCERIN (NITROSTAT) 0.4 MG SL tablet, Place 1 tablet (0.4 mg total) under the tongue every 5 (five) minutes as needed for chest pain., Disp: 20 tablet, Rfl: 12 .  Omega-3 Fatty Acids (FISH OIL) 1000 MG CPDR, Take 2 capsules by  mouth daily., Disp: , Rfl:   Allergies  Allergen Reactions  . Ace Inhibitors Cough  . Ciprofloxacin Other (See Comments)    unknown  . Hydrocodone-Acetaminophen Other (See Comments)    unknown  . Norvasc [Amlodipine Besylate]     Pruritus   . Pantoprazole Sodium Other (See Comments)    Cramps  . Pravastatin Other (See Comments)    Stomach cramps  . Sulfa Antibiotics     unknown    I personally reviewed active problem list, medication list, allergies, family  history, social history with the patient/caregiver today.   ROS  Ten systems reviewed and is negative except as mentioned in HPI   Objective  Vitals:   08/10/18 0752  BP: 130/84  Pulse: 72  Resp: 16  Temp: 97.7 F (36.5 C)  TempSrc: Oral  SpO2: 96%  Weight: 187 lb (84.8 kg)  Height: 5\' 5"  (1.651 m)    Body mass index is 31.12 kg/m.  Physical Exam  Constitutional: Patient appears well-developed and well-nourished. Obese  No distress.  HEENT: head atraumatic, normocephalic, pupils equal and reactive to light, neck supple, throat within normal limits Cardiovascular: Normal rate, regular rhythm and normal heart sounds.  No murmur heard. No BLE edema. Pulmonary/Chest: Effort normal and breath sounds normal. No respiratory distress. Abdominal: Soft.  There is no tenderness. Psychiatric: Patient has a normal mood and affect. behavior is normal. Judgment and thought content normal.  Recent Results (from the past 2160 hour(s))  CBC with Differential/Platelet     Status: Abnormal   Collection Time: 06/16/18  2:20 PM  Result Value Ref Range   WBC 4.9 3.8 - 10.8 Thousand/uL   RBC 3.35 (L) 3.80 - 5.10 Million/uL   Hemoglobin 10.1 (L) 11.7 - 15.5 g/dL   HCT 30.9 (L) 35.0 - 45.0 %   MCV 92.2 80.0 - 100.0 fL   MCH 30.1 27.0 - 33.0 pg   MCHC 32.7 32.0 - 36.0 g/dL   RDW 12.2 11.0 - 15.0 %   Platelets 284 140 - 400 Thousand/uL   MPV 9.5 7.5 - 12.5 fL   Neutro Abs 2,622 1,500 - 7,800 cells/uL   Lymphs Abs 1,622 850  - 3,900 cells/uL   WBC mixed population 426 200 - 950 cells/uL   Eosinophils Absolute 191 15 - 500 cells/uL   Basophils Absolute 39 0 - 200 cells/uL   Neutrophils Relative % 53.5 %   Total Lymphocyte 33.1 %   Monocytes Relative 8.7 %   Eosinophils Relative 3.9 %   Basophils Relative 0.8 %  COMPLETE METABOLIC PANEL WITH GFR     Status: Abnormal   Collection Time: 06/16/18  2:20 PM  Result Value Ref Range   Glucose, Bld 114 65 - 139 mg/dL    Comment: .        Non-fasting reference interval .    BUN 13 7 - 25 mg/dL   Creat 1.39 (H) 0.60 - 0.93 mg/dL    Comment: For patients >65 years of age, the reference limit for Creatinine is approximately 13% higher for people identified as African-American. .    GFR, Est Non African American 38 (L) > OR = 60 mL/min/1.14m2   GFR, Est African American 44 (L) > OR = 60 mL/min/1.74m2   BUN/Creatinine Ratio 9 6 - 22 (calc)   Sodium 135 135 - 146 mmol/L   Potassium 4.1 3.5 - 5.3 mmol/L   Chloride 101 98 - 110 mmol/L   CO2 26 20 - 32 mmol/L   Calcium 9.2 8.6 - 10.4 mg/dL   Total Protein 6.0 (L) 6.1 - 8.1 g/dL   Albumin 3.7 3.6 - 5.1 g/dL   Globulin 2.3 1.9 - 3.7 g/dL (calc)   AG Ratio 1.6 1.0 - 2.5 (calc)   Total Bilirubin 0.4 0.2 - 1.2 mg/dL   Alkaline phosphatase (APISO) 90 33 - 130 U/L   AST 16 10 - 35 U/L   ALT 9 6 - 29 U/L  Lipid panel     Status: Abnormal   Collection Time: 06/16/18  2:20 PM  Result Value Ref Range   Cholesterol 323 (H) <200 mg/dL   HDL 61 >50 mg/dL   Triglycerides 187 (H) <150 mg/dL   LDL Cholesterol (Calc) 226 (H) mg/dL (calc)    Comment: LDL-C levels > or = 190 mg/dL may indicate familial  hypercholesterolemia (FH). Clinical assessment and  measurement of blood lipid levels should be  considered for all first degree relatives of  patients with an FH diagnosis.  For questions about testing for familial hypercholesterolemia, please call Insurance risk surveyor at [email or web address]. Duncan Dull, et al. J  National Lipid Association  Recommendations for Patient-Centered Management of  Dyslipidemia: Part 1 Journal of Clinical Lipidology  2015;9(2), 129-169. Reference range: <100 . Desirable range <100 mg/dL for primary prevention;   <70 mg/dL for patients with CHD or diabetic patients  with > or = 2 CHD risk factors. Marland Kitchen LDL-C is now calculated using the Martin-Hopkins  calculation, which is a validated novel method providing  better accuracy than the Friedewald equation in the  estimation of LDL-C.  Cresenciano Genre et al. Annamaria Helling. 5638;756(43): 2061-2068  (http://education.[employer].com/faq/FAQ164)    Total CHOL/HDL Ratio 5.3 (H) <5.0 (calc)   Non-HDL Cholesterol (Calc) 262 (H) <130 mg/dL (calc)    Comment: Non-HDL level > or = 220 is very high and may indicate  genetic familial hypercholesterolemia (FH). Clinical  assessment and measurement of blood lipid levels  should be considered for all first-degree relatives  of patients with an FH diagnosis. . For patients with diabetes plus 1 major ASCVD risk  factor, treating to a non-HDL-C goal of <100 mg/dL  (LDL-C of <70 mg/dL) is considered a therapeutic  option.   TSH     Status: None   Collection Time: 06/16/18  2:20 PM  Result Value Ref Range   TSH 0.62 0.40 - 4.50 mIU/L    PHQ2/9: Depression screen [facility] 2/9 08/10/2018 06/16/2018 03/30/2018 11/01/2017 03/21/2017  Decreased Interest 0 0 0 0 0  Down, Depressed, Hopeless 0 0 0 0 0  PHQ - 2 Score 0 0 0 0 0  Altered sleeping 0 0 0 - -  Tired, decreased energy 0 0 0 - -  Change in appetite 0 0 0 - -  Feeling bad or failure about yourself  0 0 0 - -  Trouble concentrating 0 0 0 - -  Moving slowly or fidgety/restless 0 0 0 - -  Suicidal thoughts 0 0 0 - -  PHQ-9 Score 0 0 0 - -  Difficult doing work/chores Not difficult at all - Not difficult at all - -    GAD 7 : Generalized Anxiety Score 08/10/2018 06/16/2018  Nervous, Anxious, on Edge 0 0  Control/stop worrying 1 0  Worry too  much - different things 1 0  Trouble relaxing 0 0  Restless 0 0  Easily annoyed or irritable 0 0  Afraid - awful might happen 0 0  Total GAD 7 Score 2 0  Anxiety Difficulty Not difficult at all Not difficult at all     Fall Risk: Fall Risk  08/10/2018 08/10/2018 06/16/2018 03/30/2018 12/14/2017  Falls in the past year? 0 0 No No No  Number falls in past yr: 0 - - - -  Injury with Fall? 0 - - - -  Risk for fall due to : - - - Impaired vision -  Risk for fall due to: Comment - - - wears eyeglasses but unable to see out of them. Refused referral to  be schedule for eye exam -     Assessment & Plan  1. Hypertension, benign  Back to normal today  2. Panic attack  - hydrOXYzine (ATARAX/VISTARIL) 10 MG tablet; Take 1 tablet (10 mg total) by mouth daily as needed.  Dispense: 30 tablet; Refill: 0 - citalopram (CELEXA) 10 MG tablet; Take 1 tablet (10 mg total) by mouth daily.  Dispense: 30 tablet; Refill: 1  3. Toothache  She needs to have tooth extracted but when she goes she is nervous and bp goes up, advised to take hydroxyzine 30 minutes before

## 2018-08-21 ENCOUNTER — Encounter: Payer: Self-pay | Admitting: Emergency Medicine

## 2018-08-21 ENCOUNTER — Emergency Department: Payer: Medicare Other

## 2018-08-21 ENCOUNTER — Emergency Department
Admission: EM | Admit: 2018-08-21 | Discharge: 2018-08-21 | Disposition: A | Discharge status: Home / Self Care | Payer: Medicare Other | Attending: Emergency Medicine | Admitting: Emergency Medicine

## 2018-08-21 ENCOUNTER — Other Ambulatory Visit: Payer: Self-pay

## 2018-08-21 DIAGNOSIS — R1084 Generalized abdominal pain: Secondary | ICD-10-CM

## 2018-08-21 DIAGNOSIS — R200 Anesthesia of skin: Secondary | ICD-10-CM | POA: Insufficient documentation

## 2018-08-21 DIAGNOSIS — Z7982 Long term (current) use of aspirin: Secondary | ICD-10-CM | POA: Insufficient documentation

## 2018-08-21 DIAGNOSIS — I129 Hypertensive chronic kidney disease with stage 1 through stage 4 chronic kidney disease, or unspecified chronic kidney disease: Secondary | ICD-10-CM | POA: Insufficient documentation

## 2018-08-21 DIAGNOSIS — E871 Hypo-osmolality and hyponatremia: Secondary | ICD-10-CM | POA: Diagnosis not present

## 2018-08-21 DIAGNOSIS — Z955 Presence of coronary angioplasty implant and graft: Secondary | ICD-10-CM | POA: Insufficient documentation

## 2018-08-21 DIAGNOSIS — N183 Chronic kidney disease, stage 3 (moderate): Secondary | ICD-10-CM | POA: Insufficient documentation

## 2018-08-21 DIAGNOSIS — E039 Hypothyroidism, unspecified: Secondary | ICD-10-CM | POA: Insufficient documentation

## 2018-08-21 DIAGNOSIS — R1030 Lower abdominal pain, unspecified: Secondary | ICD-10-CM | POA: Insufficient documentation

## 2018-08-21 DIAGNOSIS — Z87891 Personal history of nicotine dependence: Secondary | ICD-10-CM | POA: Insufficient documentation

## 2018-08-21 DIAGNOSIS — I252 Old myocardial infarction: Secondary | ICD-10-CM | POA: Diagnosis not present

## 2018-08-21 DIAGNOSIS — Z79899 Other long term (current) drug therapy: Secondary | ICD-10-CM | POA: Insufficient documentation

## 2018-08-21 DIAGNOSIS — R531 Weakness: Secondary | ICD-10-CM | POA: Diagnosis not present

## 2018-08-21 DIAGNOSIS — R079 Chest pain, unspecified: Secondary | ICD-10-CM

## 2018-08-21 HISTORY — DX: Disorder of kidney and ureter, unspecified: N28.9

## 2018-08-21 LAB — BASIC METABOLIC PANEL
Anion gap: 10 (ref 5–15)
Anion gap: 9 (ref 5–15)
BUN: 18 mg/dL (ref 8–23)
BUN: 17 mg/dL (ref 8–23)
CHLORIDE: 92 mmol/L — AB (ref 98–111)
CO2: 23 mmol/L (ref 22–32)
CO2: 21 mmol/L — ABNORMAL LOW (ref 22–32)
Calcium: 9.3 mg/dL (ref 8.9–10.3)
Calcium: 8.9 mg/dL (ref 8.9–10.3)
Chloride: 96 mmol/L — ABNORMAL LOW (ref 98–111)
Creatinine, Ser: 1.4 mg/dL — ABNORMAL HIGH (ref 0.44–1.00)
Creatinine, Ser: 1.34 mg/dL — ABNORMAL HIGH (ref 0.44–1.00)
GFR calc Af Amer: 43 mL/min — ABNORMAL LOW (ref >60)
GFR calc Af Amer: 46 mL/min — ABNORMAL LOW (ref >60)
GFR calc non Af Amer: 39 mL/min — ABNORMAL LOW (ref >60)
GFR, EST NON AFRICAN AMERICAN: 37 mL/min — AB (ref >60)
GLUCOSE: 116 mg/dL — AB (ref 70–99)
Glucose, Bld: 101 mg/dL — ABNORMAL HIGH (ref 70–99)
POTASSIUM: 4.4 mmol/L (ref 3.5–5.1)
Potassium: 4.6 mmol/L (ref 3.5–5.1)
Sodium: 125 mmol/L — ABNORMAL LOW (ref 135–145)
Sodium: 126 mmol/L — ABNORMAL LOW (ref 135–145)

## 2018-08-21 LAB — CBC
HEMATOCRIT: 30.9 % — AB (ref 36.0–46.0)
HEMOGLOBIN: 10.4 g/dL — AB (ref 12.0–15.0)
MCH: 30.4 pg (ref 26.0–34.0)
MCHC: 33.7 g/dL (ref 30.0–36.0)
MCV: 90.4 fL (ref 80.0–100.0)
Platelets: 261 10*3/uL (ref 150–400)
RBC: 3.42 MIL/uL — ABNORMAL LOW (ref 3.87–5.11)
RDW: 11.5 % (ref 11.5–15.5)
WBC: 5.1 10*3/uL (ref 4.0–10.5)
nRBC: 0 % (ref 0.0–0.2)

## 2018-08-21 LAB — URINALYSIS, COMPLETE (UACMP) WITH MICROSCOPIC
Bilirubin Urine: NEGATIVE
Glucose, UA: NEGATIVE mg/dL
KETONES UR: NEGATIVE mg/dL
Leukocytes, UA: NEGATIVE
Nitrite: NEGATIVE
Protein, ur: 100 mg/dL — AB
Specific Gravity, Urine: 1.008 (ref 1.005–1.030)
pH: 7 (ref 5.0–8.0)

## 2018-08-21 LAB — HEPATIC FUNCTION PANEL
ALT: 12 U/L (ref 0–44)
AST: 22 U/L (ref 15–41)
Albumin: 3.4 g/dL — ABNORMAL LOW (ref 3.5–5.0)
Alkaline Phosphatase: 88 U/L (ref 38–126)
Bilirubin, Direct: <0.1 mg/dL (ref 0.0–0.2)
Total Bilirubin: 0.6 mg/dL (ref 0.3–1.2)
Total Protein: 6.3 g/dL — ABNORMAL LOW (ref 6.5–8.1)

## 2018-08-21 LAB — TROPONIN I
Troponin I: <0.03 ng/mL (ref <0.03)
Troponin I: <0.03 ng/mL (ref <0.03)

## 2018-08-21 LAB — LIPASE, BLOOD: Lipase: 28 U/L (ref 11–51)

## 2018-08-21 IMAGING — CR DG CHEST 2V
1 series · 2 of 2 positions shown · non-contrast
Comparison: Chest x-rays dated [DATE] and [DATE].

CLINICAL DATA: Chest and abdominal pressure since this morning.
Former smoker. History of hypertension.

EXAM:
CHEST - 2 VIEW

[Series 1: dg chest 2 view · 0.14mm/px · 2 of 2 slices shown]
[im 1/2]
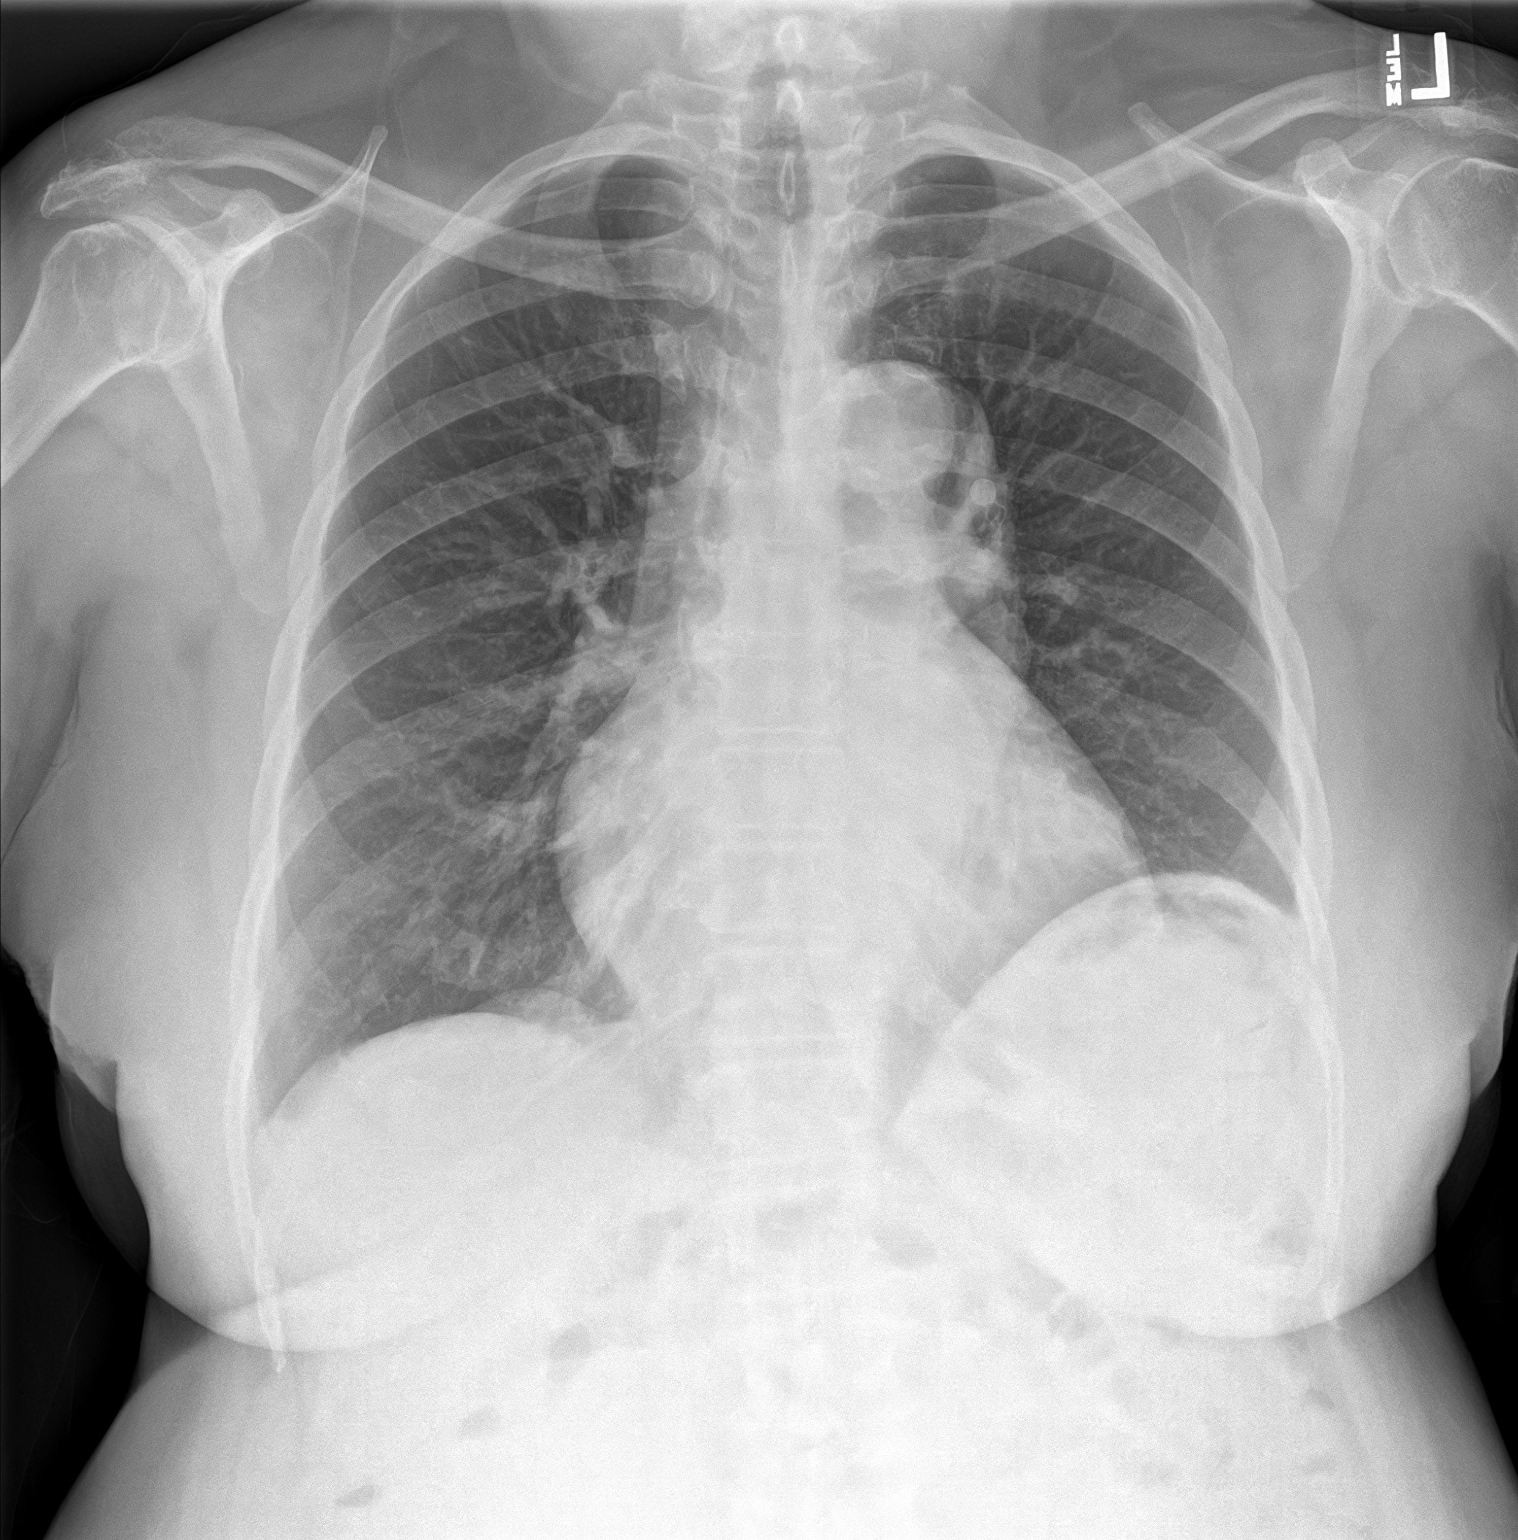
[im 2/2]
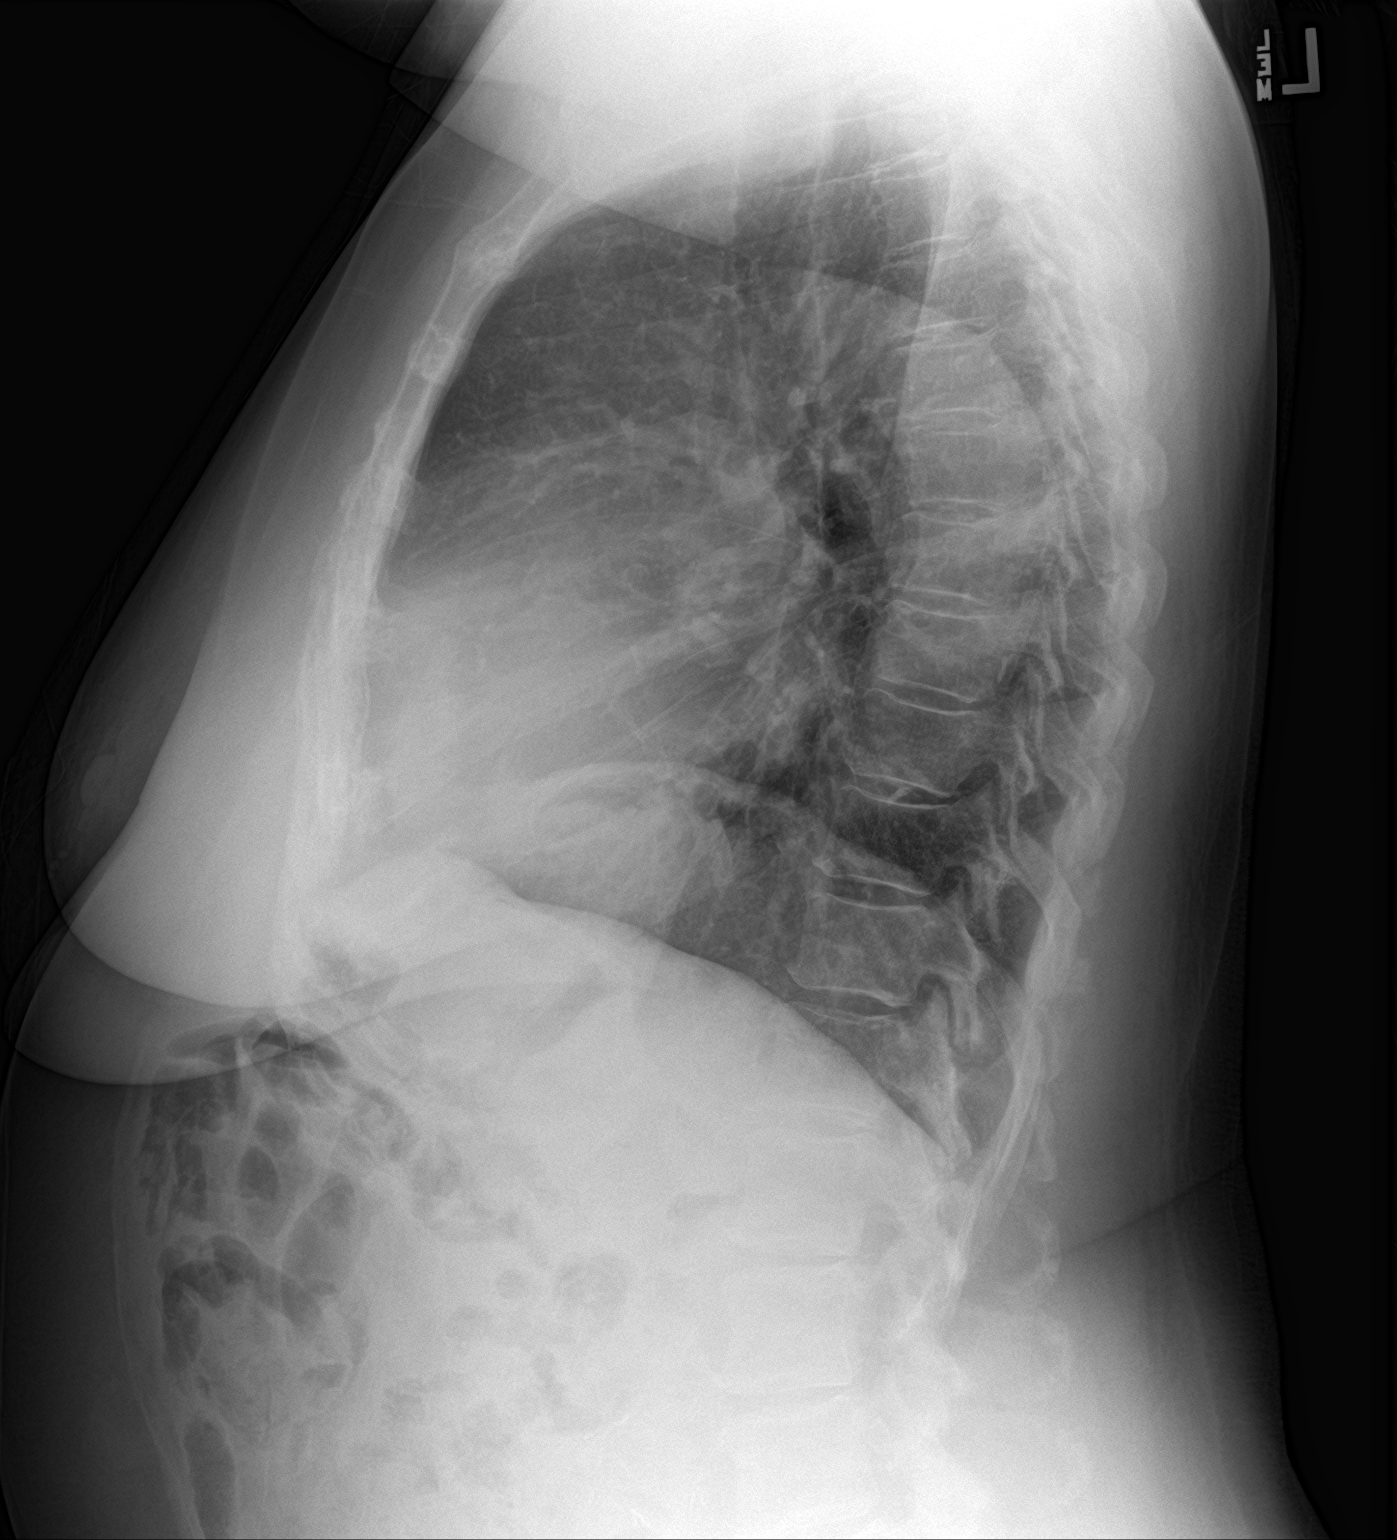

[2 of 2 positions shown; findings below may reference images not displayed]

FINDINGS: Heart size and mediastinal contours are stable. Atherosclerotic
changes noted at the aortic arch.

Lungs are clear. No pleural effusion or pneumothorax seen. Stable
elevation of the LEFT hemidiaphragm. No acute or suspicious osseous
finding.
IMPRESSION: 1. No active cardiopulmonary disease. No evidence of pneumonia or
pulmonary edema.
2. Aortic atherosclerosis.

## 2018-08-21 IMAGING — CT CT ANGIO CHEST-ABD-PELV FOR DISSECTION W/ AND WO/W CM
2 of 7 series · 13 of 46 positions shown, 15 images · IV contrast (omnipaque)
Comparison: Chest x-ray obtained earlier today. Abdomen and pelvis
CT, [DATE].

CLINICAL DATA: Pt presents with chest and abdominal pressure and
weakness since [1D]. Denies any nausea or vomiting. Also c/o near
syncopal episode. Hx AAA repair, coronary stent placement,
appendectomy, hysterectomy, hernia repair.

EXAM:
CT ANGIOGRAPHY CHEST, ABDOMEN AND PELVIS
TECHNIQUE: Multidetector CT imaging through the chest, abdomen and pelvis was
performed using the standard protocol during bolus administration of
intravenous contrast. Multiplanar reconstructed images and MIPs were
obtained and reviewed to evaluate the vascular anatomy.
CONTRAST:  75mL OMNIPAQUE IOHEXOL 350 MG/ML SOLN

[Series 6: axial arterial · axial · arterial · 0.63mm/px · z∈[-460,+71]mm · 10 of 201 slices shown, 12 images]
[im 12/201  soft-tissue]
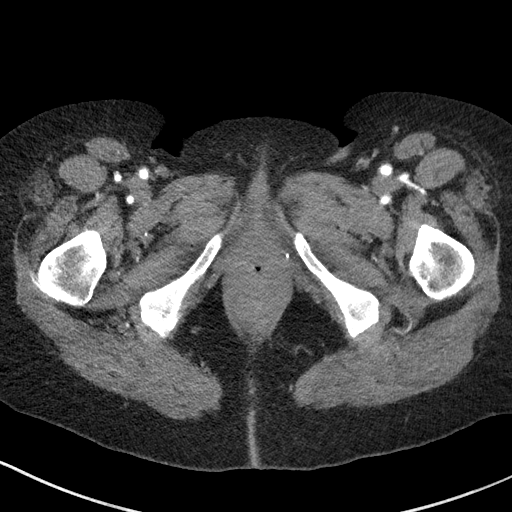
[im 12/201  bone]
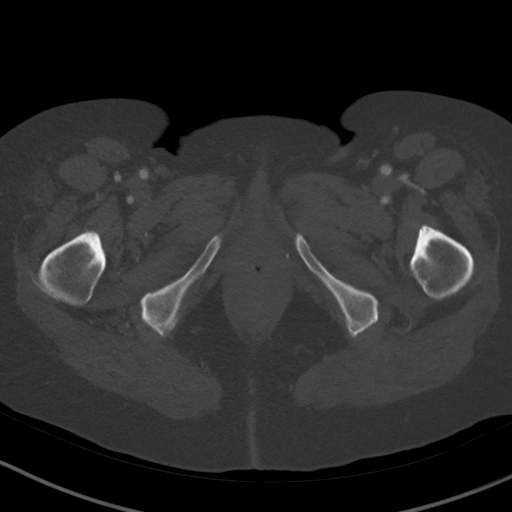
[im 36/201  soft-tissue]
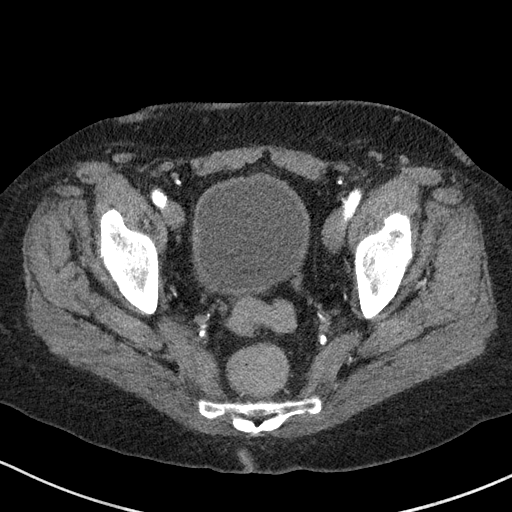
[im 59/201  soft-tissue]
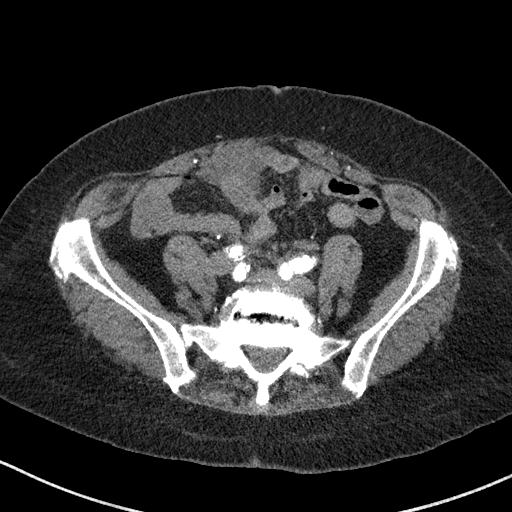
[im 71/201  soft-tissue]
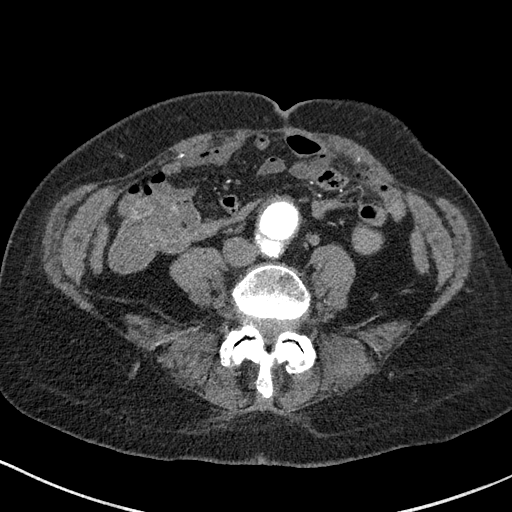
[im 95/201  soft-tissue]
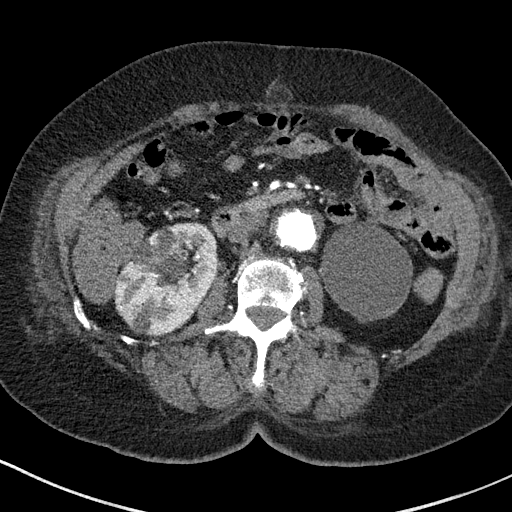
[im 106/201  soft-tissue]
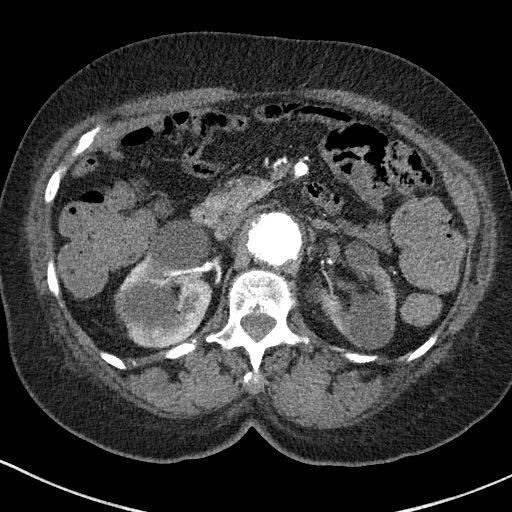
[im 130/201  soft-tissue]
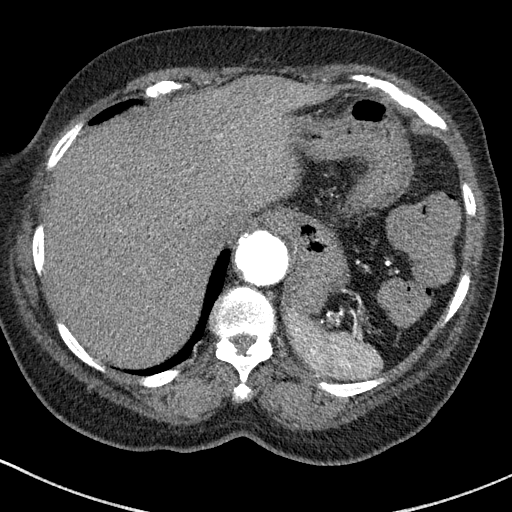
[im 153/201  soft-tissue]
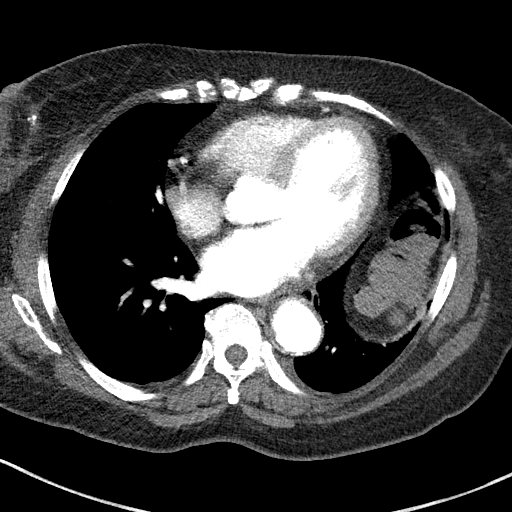
[im 165/201  soft-tissue]
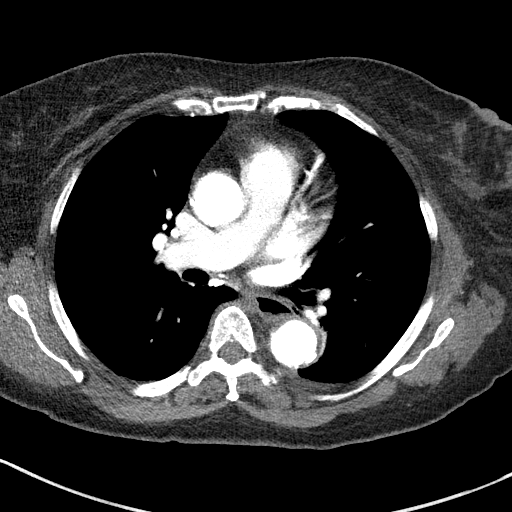
[im 165/201  bone]
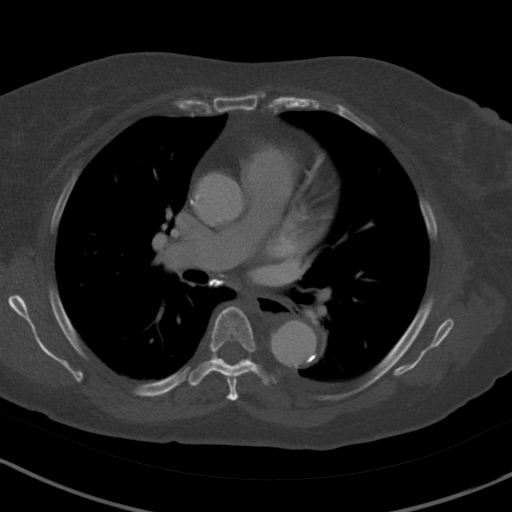
[im 189/201  soft-tissue]
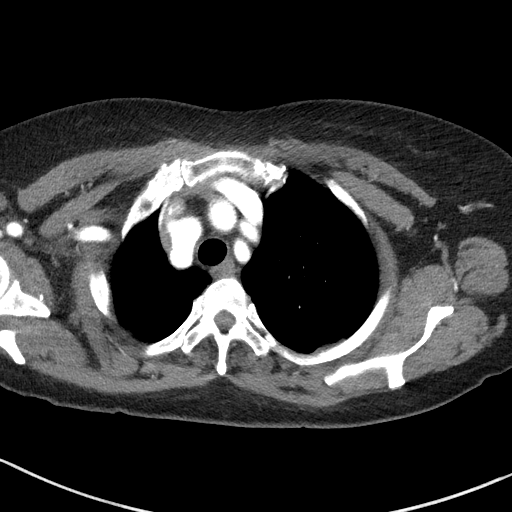

[Series 8: coronals · coronal · 0.73mm/px · 3 of 128 slices shown]
[im 32/128  soft-tissue]
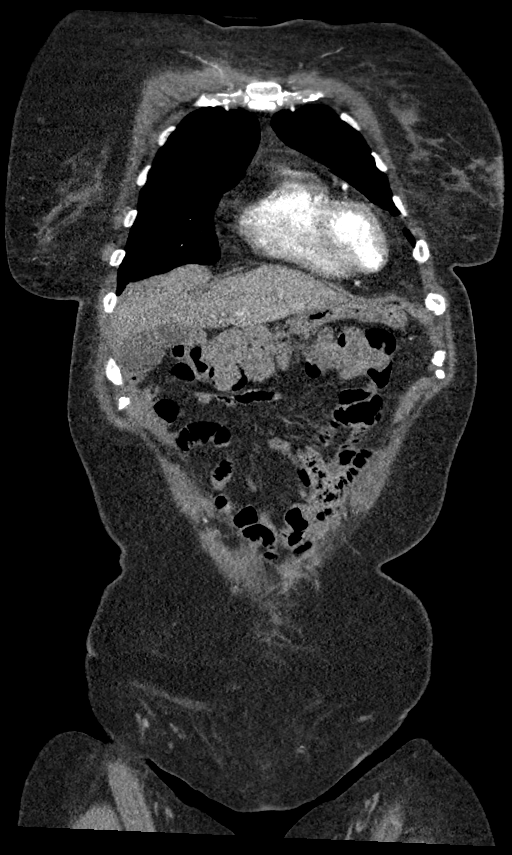
[im 64/128  soft-tissue]
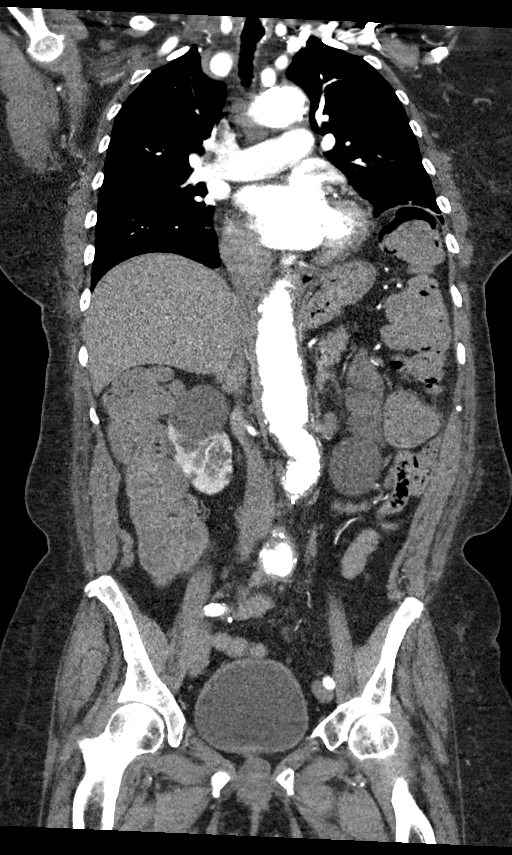
[im 96/128  soft-tissue]
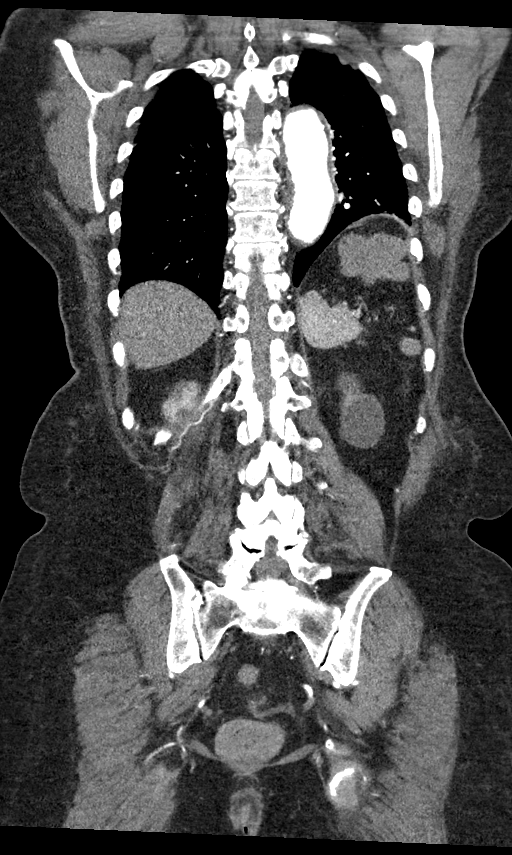

[13 of 46 positions shown; findings below may reference images not displayed]

FINDINGS: CTA CHEST FINDINGS

Cardiovascular: Heart is mildly enlarged. No pericardial effusion.
There are three-vessel coronary artery calcifications.

Thoracic aorta is mildly ectatic, maximum diameter of the superior
arch measuring 3.7 cm. There is fairly extensive aortic
atherosclerosis extending into the arch branch vessels, but without
significant stenosis.

No aortic dissection.

Mediastinum/Nodes: No neck base, mediastinal or hilar masses or
adenopathy. Trachea and esophagus are unremarkable.

Lungs/Pleura: Trace left pleural effusion. Minor subsegmental
atelectasis base of the left lower lobe. Small calcified granuloma
in the left lower lobe. No lung masses or suspicious nodules. No
pneumonia or pulmonary edema. No pneumothorax.

Musculoskeletal: No fracture or acute finding. No osteoblastic or
osteolytic lesions.

Review of the MIP images confirms the above findings.

CTA ABDOMEN AND PELVIS FINDINGS

VASCULAR

Aorta: No dissection. There is a ectasias diffuse and fairly
extensive atherosclerosis. Maximum diameter is at the aortic hiatus,
3.9 cm anterior-posterior.

Celiac: Mild plaque disc beyond the origin. No hemodynamically
significant stenosis.

SMA: Patent without evidence of aneurysm, dissection, vasculitis or
significant stenosis.

Renals: Severe atherosclerotic narrowing of the left renal artery.
50-60% focal web-like narrowing at the origin of the right renal
artery.

IMA: Occluded.

Inflow: Bilateral common iliac artery aneurysms, right 19 mm, left
16 mm. Atherosclerotic disease noted along the common, external and
internal iliac arteries. Mild dilation of the proximal left internal
iliac artery to 13 mm. No significant stenosis of either common or
external iliac artery.

Veins: No obvious venous abnormality within the limitations of this
arterial phase study.

Review of the MIP images confirms the above findings.

NON-VASCULAR

Hepatobiliary: No focal liver abnormality is seen. No gallstones,
gallbladder wall thickening, or biliary dilatation.

Pancreas: Unremarkable. No pancreatic ductal dilatation or
surrounding inflammatory changes.

Spleen: Normal in size without focal abnormality.

Adrenals/Urinary Tract: No adrenal masses. Mild right and marked
left renal cortical thinning. Decreased enhancement of the left
kidney compared to the right. There are bilateral low-attenuation
renal masses consistent with multiple cysts. Largest arises from the
lower pole of the left kidney measuring 6.3 cm. No collecting system
stones. No hydronephrosis. Ureters normal in course and in caliber.
Bladder is unremarkable.

Stomach/Bowel: Stomach is unremarkable. Small bowel and colon are
normal in caliber. No wall thickening or inflammation.

Lymphatic: No adenopathy.

Reproductive: Unremarkable.

Other: No abdominal wall hernia or abnormality. No abdominopelvic
ascites.

Musculoskeletal: No fracture or acute finding. No osteoblastic or
osteolytic lesions.

Review of the MIP images confirms the above findings.
IMPRESSION: CTA FINDINGS

1. No aortic dissection.
2. Thoracoabdominal aortic ectasia. Maximum AP diameter at the
aortic hiatus of 3.9 cm.
3. Extensive aortic atherosclerosis. No hemodynamically significant
narrowing of the aorta.
4. Severe stenosis of the left renal artery with associated left
renal atrophy and decreased left parenchymal enhancement. 50-60%
stenosis of the origin of the right renal artery. Occluded inferior
mesenteric artery. No other significant stenosis.

NON CTA FINDINGS

1. No acute findings in the chest.
2. Mild cardiomegaly.  Coronary artery calcifications.
3. No acute abnormality within the abdomen or pelvis. No findings to
account for the patient's symptoms. No bowel obstruction or
inflammation. No evidence of bowel ischemia.

## 2018-08-21 MED ORDER — LABETALOL HCL 100 MG PO TABS
100.0000 mg | ORAL_TABLET | Freq: Once | ORAL | Status: AC
  Administered 2018-08-21: 100 mg via ORAL
  Filled 2018-08-21 (×3): qty 1

## 2018-08-21 MED ORDER — CLONIDINE HCL 0.1 MG PO TABS
0.2000 mg | ORAL_TABLET | Freq: Once | ORAL | Status: AC
  Administered 2018-08-21: 0.2 mg via ORAL
  Filled 2018-08-21: qty 2

## 2018-08-21 MED ORDER — IOHEXOL 350 MG/ML SOLN
75.0000 mL | Freq: Once | INTRAVENOUS | Status: AC | PRN
  Administered 2018-08-21: 75 mL via INTRAVENOUS

## 2018-08-21 MED ORDER — SODIUM CHLORIDE 0.9 % IV BOLUS
1000.0000 mL | Freq: Once | INTRAVENOUS | Status: AC
  Administered 2018-08-21: 1000 mL via INTRAVENOUS

## 2018-08-21 NOTE — ED Notes (Signed)
ED Provider at bedside. 

## 2018-08-21 NOTE — ED Notes (Signed)
Pt unable to void at this time  md aware.  

## 2018-08-21 NOTE — ED Provider Notes (Signed)
[facility] Emergency Department Provider Note  ____________________________________________  Time seen: Approximately 4:00 PM  I have reviewed the triage vital signs and the nursing notes.   HISTORY  Chief Complaint Chest Pain   HPI Betty Nielsen is a 72 y.o. female with history of AAA status post repair, hypertension, hyperlipidemia, CAD who presents for evaluation of chest pain and abdominal pain.  Patient reports that she was in her usual state of health this morning when she woke up.  She then reports feeling very weak.  She laid down in her bed when she started having what she describes as numbness in her chest.  The numbness migrated down to her abdomen and then developed into a cramping lower abdominal pain.  She try to have a bowel movement unsuccessfully.  She reports taking 2 doses of MiraLAX and an hour later had a bowel movement with full resolution of her symptoms.  She reports that she then started having the sensation in her chest again associated with several episodes of dry heaving.  That brought her to the walk-in clinic.  When she was there she started having the same numbness in her chest migrating down to her abdomen which prompted them to send her to the emergency room for evaluation.  At this time patient has no chest pain or abdominal pain.  She has no shortness of breath or fever.  She denies any back pain.  She reports that her last bowel movement was 2 days ago which is normal for her.  No dysuria or hematuria.  Past Medical History:  Diagnosis Date  . Arthrosis of knee   . Chronic kidney disease   . Hyperlipidemia   . Hypertension   . Loss of hearing   . Metabolic syndrome   . Plantar fasciitis   . Renal insufficiency     Patient Active Problem List   Diagnosis Date Noted  . Malnutrition of mild degree ([place]) 06/16/2017  . PMR (polymyalgia rheumatica) ([place]) 06/15/2017  . Elevated troponin 10/27/2016  . Essential hypertension,  malignant 10/27/2016  . Headache 10/27/2016  . CKD (chronic kidney disease), stage III ([place]) 10/27/2016  . Hyperlipidemia 10/27/2016  . Hyponatremia 10/27/2016  . Angina pectoris ([place]) 10/25/2016  . History of non-ST elevation myocardial infarction (NSTEMI) 10/25/2016  . History of coronary angioplasty 10/25/2016  . BPV (benign positional vertigo) 06/15/2016  . 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[place]) 11/21/2015  . Anemia of chronic disease 06/05/2015  . Acid reflux 06/05/2015  . Hypertension, benign 06/05/2015  . Hypothyroidism, 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  . Major depression in remission ([place]) 06/05/2015  . Obesity (BMI 30.0-34.9) 06/05/2015  . Neuropathy 06/05/2015  . Arthritis, degenerative 07/03/2014  . AAA (abdominal aortic aneurysm) without rupture ([place]) 06/26/2014    Past Surgical History:  Procedure Laterality Date  . ABDOMINAL AORTIC ANEURYSM REPAIR    . ABDOMINAL HYSTERECTOMY    . APPENDECTOMY    . CORONARY STENT INTERVENTION N/A 10/26/2016   Procedure: Coronary Stent Intervention;  Surgeon: Yolonda Kida, MD;  Location: [place] [facility];  Service: Cardiovascular;  Laterality: N/A;  . HERNIA REPAIR    . INNER EAR SURGERY     pt not sure of type  . LEFT HEART CATH AND CORONARY ANGIOGRAPHY N/A 10/26/2016   Procedure: Left Heart Cath and Coronary Angiography;  Surgeon: Teodoro Spray, MD;  Location: [place] [facility];  Service: Cardiovascular;  Laterality: N/A;  . THYROIDECTOMY      Prior to Admission medications  Medication Sig Start Date End Date Taking? Authorizing Provider  acetaminophen (TYLENOL) 500 MG tablet Take 500 mg by mouth every 6 (six) hours as needed.    [provider]  aspirin 81 MG tablet Take 81 mg by mouth daily.    [provider]  cetirizine (ZYRTEC) 10 MG tablet Take 1 tablet (10 mg total) by mouth daily. Patient taking differently: Take 10 mg by mouth 2 (two) times daily.  11/01/17   Steele Sizer, MD  Cholecalciferol  (VITAMIN D-1000 MAX ST) 1000 units tablet Take by mouth.    [provider]  citalopram (CELEXA) 10 MG tablet Take 1 tablet (10 mg total) by mouth daily. 08/10/18   Steele Sizer, MD  clonazePAM (KLONOPIN) 0.5 MG tablet Take 0.5 tablets (0.25 mg total) by mouth 2 (two) times daily as needed for anxiety. 08/06/18 08/06/19  Carrie Mew, MD  cloNIDine (CATAPRES) 0.2 MG tablet Take 0.2 mg by mouth 2 (two) times daily.    Lavonia Dana, MD  Garlic 2694 MG CAPS Take by mouth.    [provider]  hydrOXYzine (ATARAX/VISTARIL) 10 MG tablet Take 1 tablet (10 mg total) by mouth daily as needed. 08/10/18   Steele Sizer, MD  irbesartan (AVAPRO) 300 MG tablet Take 1 tablet (300 mg total) by mouth daily. 12/14/16   Steele Sizer, MD  Iron-Vitamins (GERITOL COMPLETE) TABS Take by mouth.    [provider]  labetalol (NORMODYNE) 100 MG tablet Take 1 tablet by mouth 2 (two) times daily.  10/27/17   Callwood, Dwayne D, MD  levothyroxine (SYNTHROID, LEVOTHROID) 75 MCG tablet Take 1 tablet (75 mcg total) by mouth daily before breakfast. 04/03/18   Steele Sizer, MD  MULTIPLE VITAMIN PO Take 1 capsule by mouth daily.    [provider]  nitroGLYCERIN (NITROSTAT) 0.4 MG SL tablet Place 1 tablet (0.4 mg total) under the tongue every 5 (five) minutes as needed for chest pain. 10/27/16   Theodoro Grist, MD  Omega-3 Fatty Acids (FISH OIL) 1000 MG CPDR Take 2 capsules by mouth daily.    [provider]    Allergies Ace inhibitors; Ciprofloxacin; Hydrocodone-acetaminophen; Norvasc [amlodipine besylate]; Pantoprazole sodium; Pravastatin; and Sulfa antibiotics  Family History  Problem Relation Age of Onset  . Healthy Mother   . Cerebral aneurysm Father     Social History Social History   Tobacco Use  . Smoking status: Former Smoker    Packs/day: 0.50    Years: 20.00    Pack years: 10.00    Types: Cigarettes    Last attempt to quit: 2000    Years since  quitting: 19.9  . Smokeless tobacco: Never Used  . Tobacco comment: smoking cessation materials not required  Substance Use Topics  . Alcohol use: No    Alcohol/week: 0.0 standard drinks  . Drug use: No    Review of Systems  Constitutional: Negative for fever. Eyes: Negative for visual changes. ENT: Negative for sore throat. Neck: No neck pain  Cardiovascular: + chest pain. Respiratory: Negative for shortness of breath. Gastrointestinal: + abdominal pain, nausea, and dry heaving. No diarrhea. Genitourinary: Negative for dysuria. Musculoskeletal: Negative for back pain. Skin: Negative for rash. Neurological: Negative for headaches, weakness or numbness. Psych: No SI or HI  ____________________________________________   PHYSICAL EXAM:  VITAL SIGNS: ED Triage Vitals  Enc Vitals Group     BP 08/21/18 1424 (!) 207/87     Pulse Rate 08/21/18 1424 (!) 58     Resp 08/21/18 1424 16  Temp --      Temp src --      SpO2 08/21/18 1424 100 %     Weight 08/21/18 1207 187 lb (84.8 kg)     Height 08/21/18 1207 5\' 5"  (1.651 m)     Head Circumference --      Peak Flow --      Pain Score 08/21/18 1205 0     Pain Loc --      Pain Edu? --      Excl. in [place]? --     Constitutional: Alert and oriented. Well appearing and in no apparent distress. HEENT:      Head: Normocephalic and atraumatic.         Eyes: Conjunctivae are normal. Sclera is non-icteric.       Mouth/Throat: Mucous membranes are moist.       Neck: Supple with no signs of meningismus. Cardiovascular: Regular rate and rhythm. No murmurs, gallops, or rubs. 2+ symmetrical distal pulses are present in all extremities. No JVD. Respiratory: Normal respiratory effort. Lungs are clear to auscultation bilaterally. No wheezes, crackles, or rhonchi.  Gastrointestinal: Soft, non tender, and non distended with positive bowel sounds. No rebound or guarding. Genitourinary: No CVA tenderness. Musculoskeletal: Nontender with normal  range of motion in all extremities. No edema, cyanosis, or erythema of extremities. Neurologic: Normal speech and language. Face is symmetric. Moving all extremities. No gross focal neurologic deficits are appreciated. Skin: Skin is warm, dry and intact. No rash noted. Psychiatric: Mood and affect are normal. Speech and behavior are normal.  ____________________________________________   LABS (all labs ordered are listed, but only abnormal results are displayed)  Labs Reviewed  BASIC METABOLIC PANEL - Abnormal; Notable for the following components:      Result Value   Sodium 125 (*)    Chloride 92 (*)    Glucose, Bld 116 (*)    Creatinine, Ser 1.40 (*)    GFR calc non Af Amer 37 (*)    GFR calc Af Amer 43 (*)    All other components within normal limits  CBC - Abnormal; Notable for the following components:   RBC 3.42 (*)    Hemoglobin 10.4 (*)    HCT 30.9 (*)    All other components within normal limits  URINALYSIS, COMPLETE (UACMP) WITH MICROSCOPIC - Abnormal; Notable for the following components:   Color, Urine COLORLESS (*)    APPearance CLEAR (*)    Hgb urine dipstick MODERATE (*)    Protein, ur 100 (*)    Bacteria, UA RARE (*)    All other components within normal limits  HEPATIC FUNCTION PANEL - Abnormal; Notable for the following components:   Total Protein 6.3 (*)    Albumin 3.4 (*)    All other components within normal limits  BASIC METABOLIC PANEL - Abnormal; Notable for the following components:   Sodium 126 (*)    Chloride 96 (*)    CO2 21 (*)    Glucose, Bld 101 (*)    Creatinine, Ser 1.34 (*)    GFR calc non Af Amer 39 (*)    GFR calc Af Amer 46 (*)    All other components within normal limits  TROPONIN I  TROPONIN I  LIPASE, BLOOD   ____________________________________________  EKG  ED ECG REPORT I, Rudene Re, the attending physician, personally viewed and interpreted this ECG.  Sinus bradycardia, rate of 57, normal intervals, normal  axis, no ST elevations or depressions.  Unchanged from  prior. ____________________________________________  RADIOLOGY  I have personally reviewed the images performed during this visit and I agree with the Radiologist's read.   Interpretation by Radiologist:  Dg Chest 2 View  Result Date: 08/21/2018 CLINICAL DATA:  Chest and abdominal pressure since this morning. Former smoker. History of hypertension. EXAM: CHEST - 2 VIEW COMPARISON:  Chest x-rays dated 10/24/2017 and 02/23/2017. FINDINGS: Heart size and mediastinal contours are stable. Atherosclerotic changes noted at the aortic arch. Lungs are clear. No pleural effusion or pneumothorax seen. Stable elevation of the LEFT hemidiaphragm. No acute or suspicious osseous finding. IMPRESSION: 1. No active cardiopulmonary disease. No evidence of pneumonia or pulmonary edema. 2. Aortic atherosclerosis. Electronically Signed   By: Franki Cabot M.D.   On: 08/21/2018 12:50   Ct Angio Chest/abd/pel For Dissection W And/or Wo Contrast  Result Date: 08/21/2018 CLINICAL DATA:  Pt presents with chest and abdominal pressure and weakness since 0900. Denies any nausea or vomiting. Also c/o near syncopal episode. Hx AAA repair, coronary stent placement, appendectomy, hysterectomy, hernia repair. EXAM: CT ANGIOGRAPHY CHEST, ABDOMEN AND PELVIS TECHNIQUE: Multidetector CT imaging through the chest, abdomen and pelvis was performed using the standard protocol during bolus administration of intravenous contrast. Multiplanar reconstructed images and MIPs were obtained and reviewed to evaluate the vascular anatomy. CONTRAST:  68mL OMNIPAQUE IOHEXOL 350 MG/ML SOLN COMPARISON:  Chest x-ray obtained earlier today. Abdomen and pelvis CT, 11/18/2014. FINDINGS: CTA CHEST FINDINGS Cardiovascular: Heart is mildly enlarged. No pericardial effusion. There are three-vessel coronary artery calcifications. Thoracic aorta is mildly ectatic, maximum diameter of the superior arch measuring  3.7 cm. There is fairly extensive aortic atherosclerosis extending into the arch branch vessels, but without significant stenosis. No aortic dissection. Mediastinum/Nodes: No neck base, mediastinal or hilar masses or adenopathy. Trachea and esophagus are unremarkable. Lungs/Pleura: Trace left pleural effusion. Minor subsegmental atelectasis base of the left lower lobe. Small calcified granuloma in the left lower lobe. No lung masses or suspicious nodules. No pneumonia or pulmonary edema. No pneumothorax. Musculoskeletal: No fracture or acute finding. No osteoblastic or osteolytic lesions. Review of the MIP images confirms the above findings. CTA ABDOMEN AND PELVIS FINDINGS VASCULAR Aorta: No dissection. There is a ectasias diffuse and fairly extensive atherosclerosis. Maximum diameter is at the aortic hiatus, 3.9 cm anterior-posterior. Celiac: Mild plaque disc beyond the origin. No hemodynamically significant stenosis. SMA: Patent without evidence of aneurysm, dissection, vasculitis or significant stenosis. 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 50-60% focal web-like narrowing at the origin of the right renal artery. IMA: Occluded. 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 Atherosclerotic disease noted along the common, external and internal iliac arteries. Mild dilation of the proximal left internal iliac artery to 13 mm. No significant stenosis of either common or external iliac artery. Veins: No obvious venous abnormality within the limitations of this arterial phase study. Review of the MIP images confirms the above findings. NON-VASCULAR Hepatobiliary: No focal liver abnormality is seen. No gallstones, gallbladder wall thickening, or biliary dilatation. Pancreas: Unremarkable. No pancreatic ductal dilatation or surrounding inflammatory changes. Spleen: Normal in size without focal abnormality. Adrenals/Urinary Tract: No adrenal masses. Mild right and  marked left renal cortical thinning. Decreased enhancement of the left kidney compared to the right. There are bilateral low-attenuation renal masses consistent with multiple cysts. Largest arises from the lower pole of the left kidney measuring 6.3 cm. No collecting system stones. No hydronephrosis. Ureters normal in course and in caliber. Bladder is unremarkable.  Stomach/Bowel: Stomach is unremarkable. Small bowel and colon are normal in caliber. No wall thickening or inflammation. Lymphatic: No adenopathy. Reproductive: Unremarkable. Other: No abdominal wall hernia or abnormality. No abdominopelvic ascites. Musculoskeletal: No fracture or acute finding. No osteoblastic or osteolytic lesions. Review of the MIP images confirms the above findings. IMPRESSION: CTA FINDINGS 1. No aortic dissection. 2. Thoracoabdominal aortic ectasia. Maximum AP diameter at the aortic hiatus of 3.9 cm. 3. Extensive aortic atherosclerosis. No hemodynamically significant narrowing of the aorta. 4. 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 50-60% stenosis of the origin of the right renal artery. Occluded inferior mesenteric artery. No other significant stenosis. NON CTA FINDINGS 1. No acute findings in the chest. 2. Mild cardiomegaly.  Coronary artery calcifications. 3. No acute abnormality within the abdomen or pelvis. No findings to account for the patient's symptoms. No bowel obstruction or inflammation. No evidence of bowel ischemia. Electronically Signed   By: Lajean Manes M.D.   On: 08/21/2018 16:43     ____________________________________________   PROCEDURES  Procedure(s) performed: None Procedures Critical Care performed:  None ____________________________________________   INITIAL IMPRESSION / ASSESSMENT AND PLAN / ED COURSE   72 y.o. female with history of AAA status post repair, hypertension, hyperlipidemia, CAD who presents for  evaluation of chest pain and abdominal pain. Ddx is broad and includes but not limited to SBO versus complications of her AAA versus diverticulitis versus GERD versus pancreatitis versus constipation versus ACS.  Patient is hypertensive with blood pressure of 207/87.  Her EKG shows no ischemic changes.  CT Angio of chest abdomen pelvis has been ordered to evaluate patient's aorta.  Second troponin is pending.  Labs showing mild hyponatremia for which patient will be given fluids.  LFTs and lipase have been added. UA pending.     _________________________ 9:16 PM on 08/21/2018 ----------------------------------------- CT negative for acute findings.  Patient had 2 troponins which were negative.  She received her evening dose of antihypertensives with improvement of her blood pressure.  Sodium slightly improved after liter normal saline.  Offered admission for hyponatremia but patient preferred to go home.  Recommended close follow-up with primary care doctor for repeat labs.  Discussed standard return precautions with patient.    As part of my medical decision making, I reviewed the following data within the [facility] notes reviewed and incorporated, Labs reviewed , EKG interpreted , Old EKG reviewed, Old chart reviewed, Radiograph reviewed , Notes from prior ED visits and [place] Controlled Substance Database    Pertinent labs & imaging results that were available during my care of the patient were reviewed by me and considered in my medical decision making (see chart for details).    ____________________________________________   FINAL CLINICAL IMPRESSION(S) / ED DIAGNOSES  Final diagnoses:  Chest pain, unspecified type  Generalized abdominal pain  Hyponatremia      NEW MEDICATIONS STARTED DURING THIS VISIT:  ED Discharge Orders    None       Note:  This document was prepared using Dragon voice recognition software and may include unintentional dictation  errors.    Rudene Re, MD 08/21/18 [phone]

## 2018-08-21 NOTE — ED Notes (Signed)
Pt alert  Watching tv.  meds given for blood pressure

## 2018-08-21 NOTE — ED Notes (Signed)
Pt reports feeling weak, near syncope and numbness.  No chest pain or sob.  Pt reports abd was hurting and took miralax    Pt had a BM.  No n/v at this time  Pt alert  Speech clear.

## 2018-08-21 NOTE — ED Triage Notes (Signed)
Pt presents with chest and abdominal pressure and weakness since 0900. Pt also stats she has had some vision issues, with "smoky" vision earlier today. LKW was last night around 2130. Pt asking to lie down in triage.

## 2018-08-21 NOTE — Discharge Instructions (Addendum)

## 2018-08-21 NOTE — ED Notes (Signed)
Pt up to bathroom.

## 2018-08-21 NOTE — ED Notes (Signed)
Patient transported to CT 

## 2018-08-22 ENCOUNTER — Emergency Department (HOSPITAL_COMMUNITY): Payer: Medicare Other

## 2018-08-22 ENCOUNTER — Other Ambulatory Visit: Payer: Self-pay

## 2018-08-22 ENCOUNTER — Emergency Department (HOSPITAL_COMMUNITY)
Admission: EM | Admit: 2018-08-22 | Discharge: 2018-08-22 | Disposition: A | Discharge status: Home / Self Care | Payer: Medicare Other | Attending: Emergency Medicine | Admitting: Emergency Medicine

## 2018-08-22 ENCOUNTER — Encounter (HOSPITAL_COMMUNITY): Payer: Self-pay

## 2018-08-22 DIAGNOSIS — F419 Anxiety disorder, unspecified: Secondary | ICD-10-CM | POA: Diagnosis present

## 2018-08-22 DIAGNOSIS — R0789 Other chest pain: Secondary | ICD-10-CM | POA: Diagnosis not present

## 2018-08-22 DIAGNOSIS — I129 Hypertensive chronic kidney disease with stage 1 through stage 4 chronic kidney disease, or unspecified chronic kidney disease: Secondary | ICD-10-CM | POA: Insufficient documentation

## 2018-08-22 DIAGNOSIS — N189 Chronic kidney disease, unspecified: Secondary | ICD-10-CM | POA: Insufficient documentation

## 2018-08-22 DIAGNOSIS — Z87891 Personal history of nicotine dependence: Secondary | ICD-10-CM | POA: Insufficient documentation

## 2018-08-22 DIAGNOSIS — E785 Hyperlipidemia, unspecified: Secondary | ICD-10-CM | POA: Insufficient documentation

## 2018-08-22 LAB — BASIC METABOLIC PANEL
Anion gap: 9 (ref 5–15)
BUN: 14 mg/dL (ref 8–23)
CO2: 22 mmol/L (ref 22–32)
Calcium: 9.5 mg/dL (ref 8.9–10.3)
Chloride: 95 mmol/L — ABNORMAL LOW (ref 98–111)
Creatinine, Ser: 1.51 mg/dL — ABNORMAL HIGH (ref 0.44–1.00)
GFR calc Af Amer: 40 mL/min — ABNORMAL LOW (ref >60)
GFR calc non Af Amer: 34 mL/min — ABNORMAL LOW (ref >60)
Glucose, Bld: 121 mg/dL — ABNORMAL HIGH (ref 70–99)
Potassium: 4.1 mmol/L (ref 3.5–5.1)
SODIUM: 126 mmol/L — AB (ref 135–145)

## 2018-08-22 LAB — CBC
HCT: 32.3 % — ABNORMAL LOW (ref 36.0–46.0)
Hemoglobin: 10.6 g/dL — ABNORMAL LOW (ref 12.0–15.0)
MCH: 30.1 pg (ref 26.0–34.0)
MCHC: 32.8 g/dL (ref 30.0–36.0)
MCV: 91.8 fL (ref 80.0–100.0)
Platelets: 290 10*3/uL (ref 150–400)
RBC: 3.52 MIL/uL — ABNORMAL LOW (ref 3.87–5.11)
RDW: 11.8 % (ref 11.5–15.5)
WBC: 4.8 10*3/uL (ref 4.0–10.5)
nRBC: 0 % (ref 0.0–0.2)

## 2018-08-22 LAB — TSH: TSH: 0.863 u[IU]/mL (ref 0.350–4.500)

## 2018-08-22 LAB — I-STAT TROPONIN, ED: Troponin i, poc: 0.01 ng/mL (ref 0.00–0.08)

## 2018-08-22 IMAGING — CR DG CHEST 2V
2 series · 2 of 2 positions shown · non-contrast
Comparison: CT chest of [DATE] and chest x-ray of the same date

CLINICAL DATA: Anxiety for several months, worse today

EXAM:
CHEST - 2 VIEW

[chest pa]
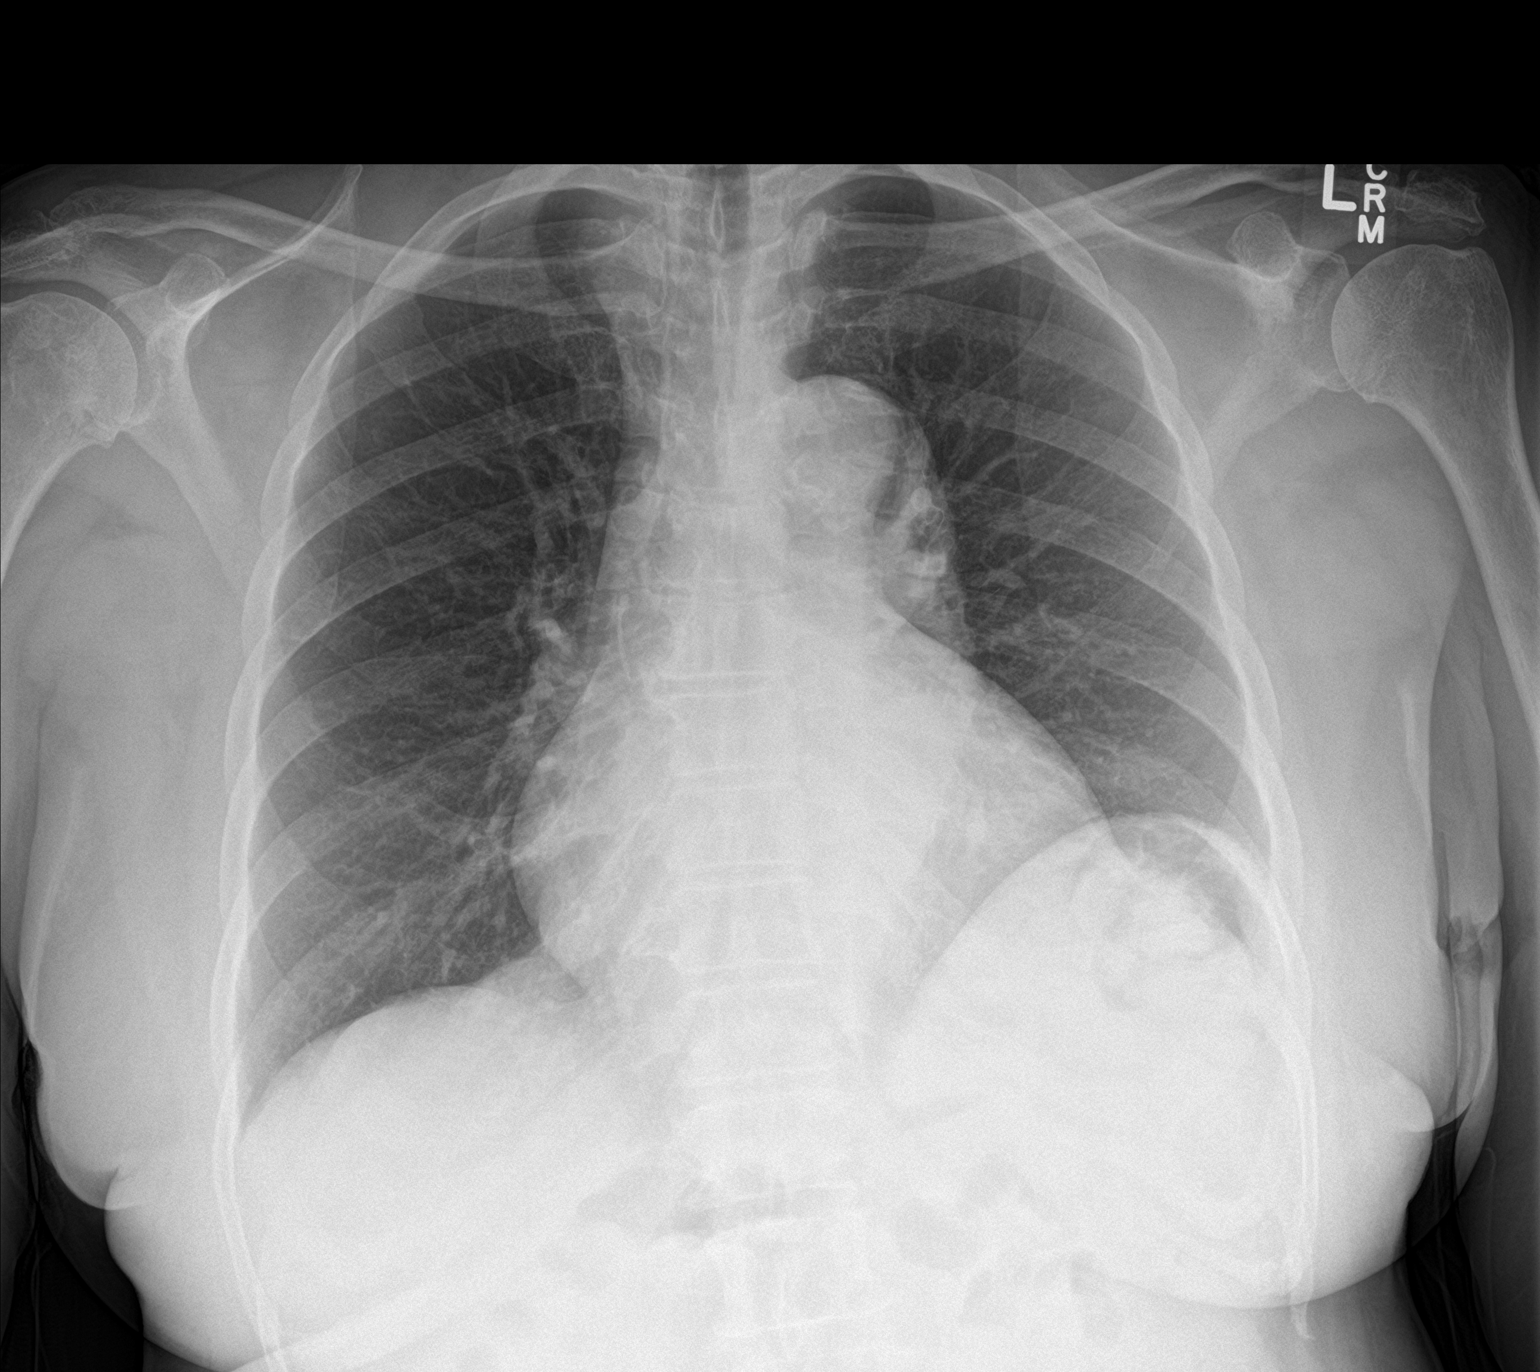

[chest lat]
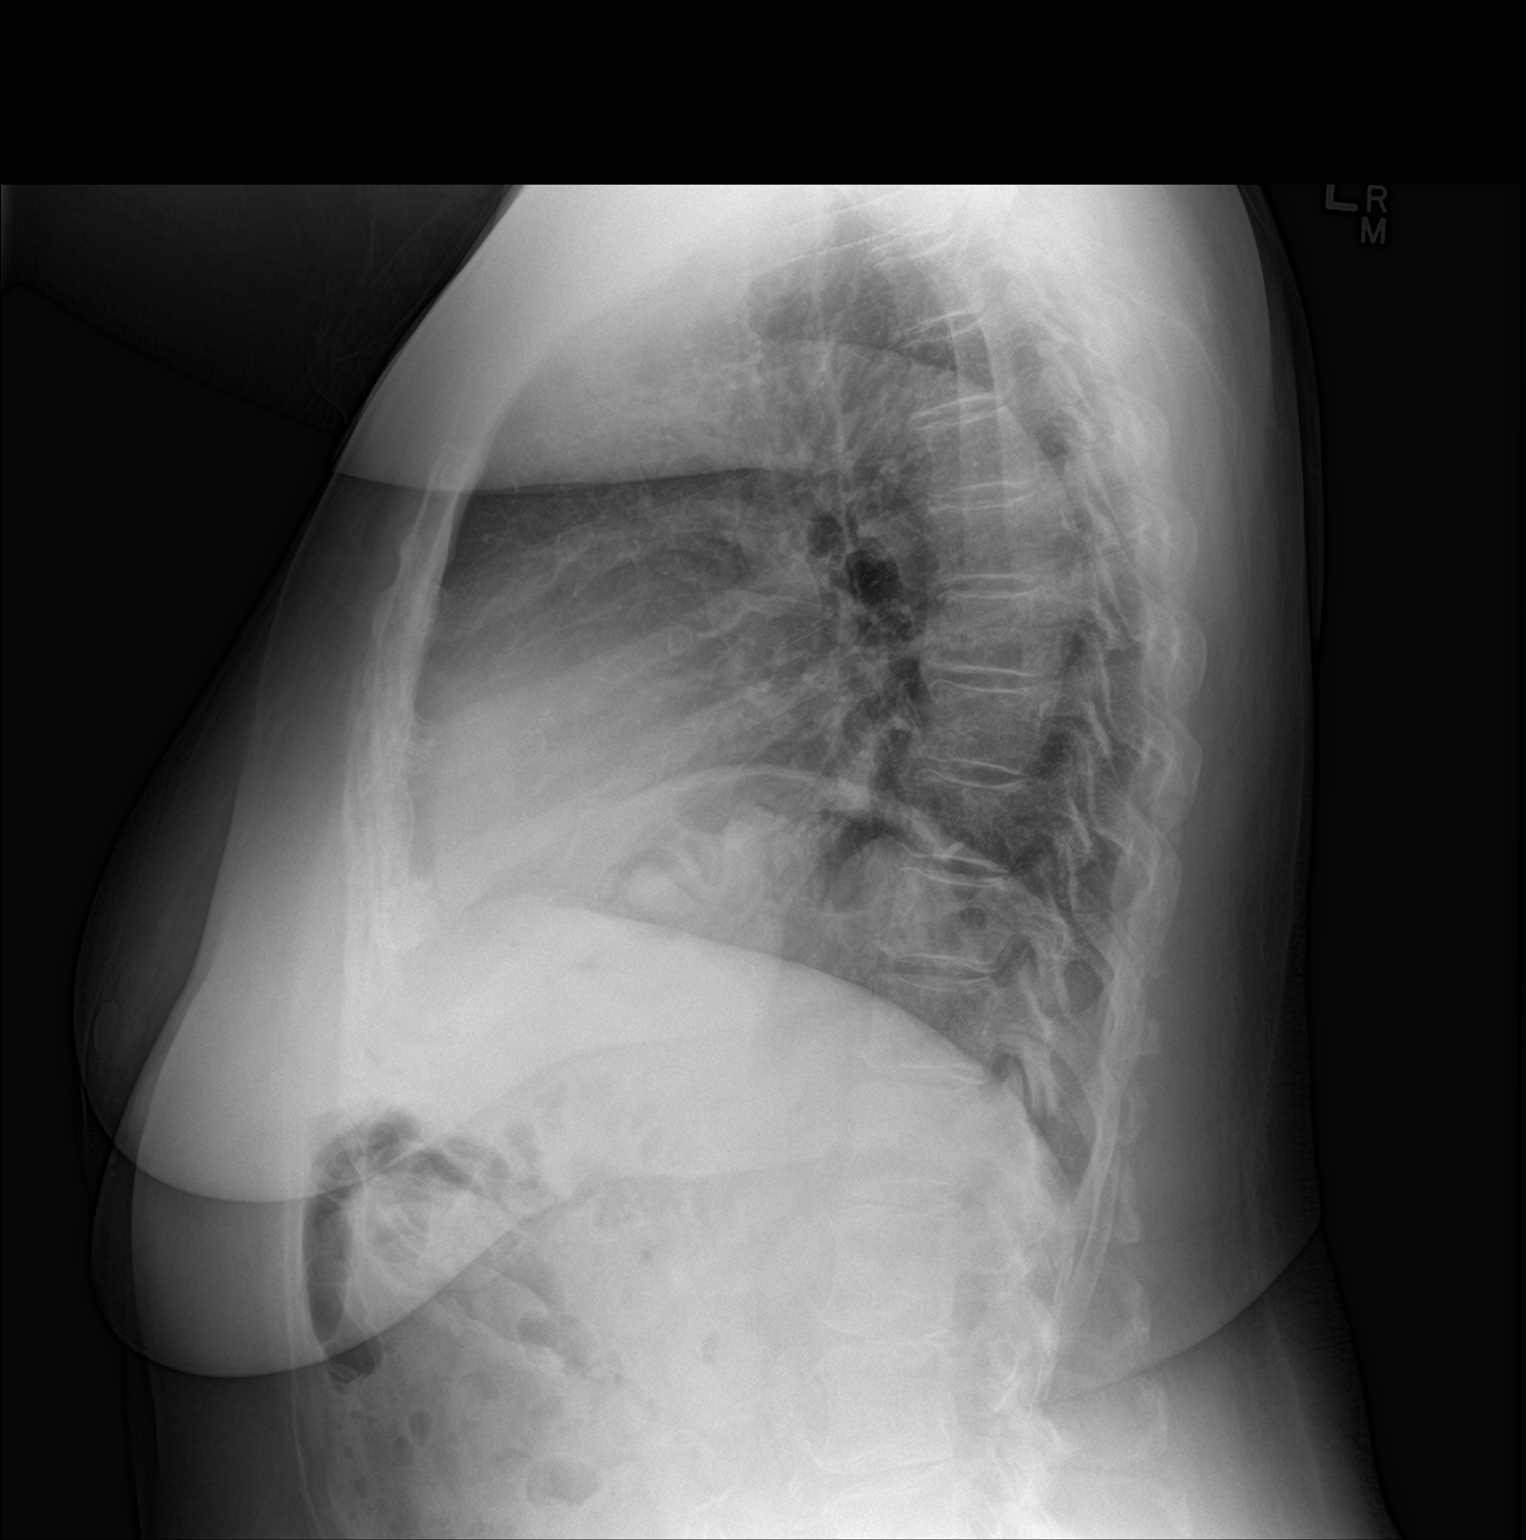

[2 of 2 positions shown; findings below may reference images not displayed]

FINDINGS: No active infiltrate or effusion is seen. Chronic elevation of the
left hemidiaphragm is noted. The heart is mildly enlarged and
stable. The descending thoracic aorta remains ectatic. No bony
abnormality is seen.
IMPRESSION: Stable chest x-ray with mild cardiomegaly.  No active lung disease.

## 2018-08-22 MED ORDER — LORAZEPAM 1 MG PO TABS: 1.0000 mg | ORAL_TABLET | Freq: Two times a day (BID) | ORAL | 0 refills | Status: DC | PRN

## 2018-08-22 MED ORDER — HYDROXYZINE HCL 25 MG PO TABS: 25.0000 mg | ORAL_TABLET | Freq: Four times a day (QID) | ORAL | 0 refills | Status: DC | PRN

## 2018-08-22 MED ORDER — LORAZEPAM 1 MG PO TABS
1.0000 mg | ORAL_TABLET | Freq: Once | ORAL | Status: AC
  Administered 2018-08-22: 1 mg via ORAL
  Filled 2018-08-22: qty 1

## 2018-08-22 NOTE — ED Provider Notes (Signed)
[facility] Emergency Department Provider Note MRN:  [id number]  Arrival date & time: 08/22/18     Chief Complaint   Anxiety   History of Present Illness   Betty Nielsen is a 72 y.o. year-old female with a history of CAD, CKD, abdominal aortic aneurysm presenting to the ED with chief complaint of anxiety.  Patient is endorsing nearly a week of intermittent sensation of "trembling inside my body".  States that she feels sad, feels like she wants to cry for no reason.  Having trouble sleeping recently.  Denies any significant triggers recently that would cause sadness or depression.  Was recently evaluated at [facility] emergency department last night, with negative CTA imaging of her chest and abdomen.  Continues to feel a pressure-like sensation in her chest and abdomen.  Occurs when she is feeling upset or tremulous.  Denies dizziness or sweatiness, no nausea or vomiting, no lower abdominal pain, no dysuria.  Review of Systems  A complete 10 system review of systems was obtained and all systems are negative except as noted in the HPI and PMH.   Patient's Health History    Past Medical History:  Diagnosis Date  . Arthrosis of knee   . Chronic kidney disease   . Hyperlipidemia   . Hypertension   . Loss of hearing   . Metabolic syndrome   . Plantar fasciitis   . Renal insufficiency     Past Surgical History:  Procedure Laterality Date  . ABDOMINAL AORTIC ANEURYSM REPAIR    . ABDOMINAL HYSTERECTOMY    . APPENDECTOMY    . CORONARY STENT INTERVENTION N/A 10/26/2016   Procedure: Coronary Stent Intervention;  Surgeon: Yolonda Kida, MD;  Location: [place] [facility];  Service: Cardiovascular;  Laterality: N/A;  . HERNIA REPAIR    . INNER EAR SURGERY     pt not sure of type  . LEFT HEART CATH AND CORONARY ANGIOGRAPHY N/A 10/26/2016   Procedure: Left Heart Cath and Coronary Angiography;  Surgeon: Teodoro Spray, MD;  Location: [place] [facility];  Service:  Cardiovascular;  Laterality: N/A;  . THYROIDECTOMY      Family History  Problem Relation Age of Onset  . Healthy Mother   . Cerebral aneurysm Father     Social History   Socioeconomic History  . Marital status: Widowed    Spouse name: Not on file  . Number of children: 1  . Years of education: GED  . Highest education level: 10th grade  Occupational History  . Occupation: Retired  Scientific laboratory technician  . Financial resource strain: Not hard at all  . Food insecurity:    Worry: Never true    Inability: Never true  . Transportation needs:    Medical: No    Non-medical: No  Tobacco Use  . Smoking status: Former Smoker    Packs/day: 0.50    Years: 20.00    Pack years: 10.00    Types: Cigarettes    Last attempt to quit: 2000    Years since quitting: 19.9  . Smokeless tobacco: Never Used  . Tobacco comment: smoking cessation materials not required  Substance and Sexual Activity  . Alcohol use: No    Alcohol/week: 0.0 standard drinks  . Drug use: No  . Sexual activity: Not Currently  Lifestyle  . Physical activity:    Days per week: 5 days    Minutes per session: 120 min  . Stress: Not at all  Relationships  .  Social connections:    Talks on phone: More than three times a week    Gets together: Once a week    Attends religious service: More than 4 times per year    Active member of club or organization: Patient refused    Attends meetings of clubs or organizations: Patient refused    Relationship status: Widowed  . Intimate partner violence:    Fear of current or ex partner: No    Emotionally abused: No    Physically abused: No    Forced sexual activity: No  Other Topics Concern  . Not on file  Social History Narrative   Lives alone   Retired from working a [place] Signs and Nursing Notes reviewed Vitals:   08/22/18 1147  BP: (!) 193/84  Pulse: 66  Resp: 18  Temp: 98.3 F (36.8 C)  SpO2: 98%    CONSTITUTIONAL: Well-appearing,  NAD NEURO:  Alert and oriented x 3, no focal deficits EYES:  eyes equal and reactive ENT/NECK:  no LAD, no JVD CARDIO: Regular rate, well-perfused, normal S1 and S2 PULM:  CTAB no wheezing or rhonchi GI/GU:  normal bowel sounds, non-distended, non-tender MSK/SPINE:  No gross deformities, no edema SKIN:  no rash, atraumatic PSYCH:  Appropriate speech and behavior  Diagnostic and Interventional Summary    EKG Interpretation  Date/Time:  Tuesday August 22 2018 13:05:46 EST Ventricular Rate:  59 PR Interval:    QRS Duration: 100 QT Interval:  450 QTC Calculation: 446 R Axis:   -11 Text Interpretation:  Sinus rhythm Abnormal R-wave progression, early transition Left ventricular hypertrophy Confirmed by Gerlene Fee [phone]) on 08/22/2018 1:33:20 PM      Labs Reviewed  CBC - Abnormal; Notable for the following components:      Result Value   RBC 3.52 (*)    Hemoglobin 10.6 (*)    HCT 32.3 (*)    All other components within normal limits  BASIC METABOLIC PANEL - Abnormal; Notable for the following components:   Sodium 126 (*)    Chloride 95 (*)    Glucose, Bld 121 (*)    Creatinine, Ser 1.51 (*)    GFR calc non Af Amer 34 (*)    GFR calc Af Amer 40 (*)    All other components within normal limits  TSH  I-STAT TROPONIN, ED    DG Chest 2 View  Final Result      Medications  LORazepam (ATIVAN) tablet 1 mg (1 mg Oral Given 08/22/18 1257)     Procedures Critical Care  ED Course and Medical Decision Making  I have reviewed the triage vital signs and the nursing notes.  Pertinent labs & imaging results that were available during my care of the patient were reviewed by me and considered in my medical decision making (see below for details).  Favoring depression/anxiety in this 72 year old female with tremulousness, sadness.  However, Tiro to discover hyponatremia of 125 last night.  She also takes levothyroxine, but TSH was not tested for last night.  Will obtain labs  to exclude worsening hyponatremia and thyroid hormone imbalance.  Chest pressure is very atypical, emotionally triggered, not suggestive of ACS but will screen with EKG and troponin.  EKG unremarkable, labs reassuring.  Patient feeling much better after single dose p.o. Ativan here in the emergency department.  Patient requesting prescription for this medication because it helped so much.  States that her home medications of Klonopin  and hydroxyzine are not helping at all.  It seems the patient has likely been underdosed on these medications, which is quite reasonable given the inherent risk of these medicines in the elderly.  I encouraged the patient to dispose of these medications while here in the emergency department so that I could replace them with different medications/dosages.  I witnessed these medicines go into the trash.  She was only prescribed 10 mg hydroxyzine once a day, I have replaced this with 25 mg every 6 hours as needed.  Also gave her a very short course of Ativan 1 mg, to only be used at night if she were having trouble sleeping.  Long discussion with patient with son at bedside discussing the dangers of these medications, how it is very important that she follow-up with her primary care provider to discuss her symptoms and her medication regimen.  After the discussed management above, the patient was determined to be safe for discharge.  The patient was in agreement with this plan and all questions regarding their care were answered.  ED return precautions were discussed and the patient will return to the ED with any significant worsening of condition.  Barth Kirks. Sedonia Small, MD [place] [email or web address]  Final Clinical Impressions(s) / ED Diagnoses     ICD-10-CM   1. Anxiety F41.9   2. Chest pressure R07.89 DG Chest 2 View    DG Chest 2 View    ED Discharge Orders         Ordered    hydrOXYzine (ATARAX/VISTARIL) 25 MG tablet   Every 6 hours PRN     08/22/18 1337    LORazepam (ATIVAN) 1 MG tablet  2 times daily PRN     08/22/18 1337             Maudie Flakes, MD 08/22/18 1342

## 2018-08-22 NOTE — Discharge Instructions (Addendum)
You were evaluated in the Emergency Department and after careful evaluation, we did not find any emergent condition requiring admission or further testing in the hospital.  Your symptoms today seem to be due to anxiety.  Her work-up today in the emergency department again did not reveal any emergencies.  Your sodium continues to be low and you appear to be slightly dehydrated.  It is important to maintain a balanced healthy diet at home, you should increase your daily water intake.  I have prescribed you hydroxyzine today at an increased dose, which you can take as needed during the day as instructed.  If you are having trouble sleeping at night due to anxiety, you can take the Ativan medication provided.  As discussed, these medications are very sedating and can be dangerous.  It is very important you follow-up with your primary care doctor to go over all of your medications.  Please do not operate heavy machinery or drive while taking any of these medications.  Please return to the Emergency Department if you experience any worsening of your condition.  We encourage you to follow up with a primary care provider.  Thank you for allowing [place] to be a part of your care.

## 2018-08-22 NOTE — ED Triage Notes (Signed)
Pt states she has been having anxiety for several months. She was seen at [facility] yesterday but states her anxiety was not addressed. She has been taking kolonopin, citalopram, and hydroxizine with no improvement. Pt has no SI but states she just feels sad.

## 2018-08-22 NOTE — ED Notes (Signed)
Pt ambulated to bathroom without difficulty.

## 2018-08-22 NOTE — ED Notes (Signed)
Patient verbalizes understanding of discharge instructions. Opportunity for questioning and answers were provided. 

## 2018-08-29 ENCOUNTER — Inpatient Hospital Stay
Admission: EM | Admit: 2018-08-29 | Discharge: 2018-09-01 | DRG: 641 | Disposition: A | Discharge status: Home / Self Care | Payer: Medicare Other | Attending: Internal Medicine | Admitting: Internal Medicine

## 2018-08-29 ENCOUNTER — Encounter: Payer: Self-pay | Admitting: Emergency Medicine

## 2018-08-29 ENCOUNTER — Other Ambulatory Visit: Payer: Self-pay

## 2018-08-29 DIAGNOSIS — Z881 Allergy status to other antibiotic agents status: Secondary | ICD-10-CM

## 2018-08-29 DIAGNOSIS — Z90710 Acquired absence of both cervix and uterus: Secondary | ICD-10-CM

## 2018-08-29 DIAGNOSIS — R809 Proteinuria, unspecified: Secondary | ICD-10-CM | POA: Diagnosis present

## 2018-08-29 DIAGNOSIS — Z87891 Personal history of nicotine dependence: Secondary | ICD-10-CM

## 2018-08-29 DIAGNOSIS — E785 Hyperlipidemia, unspecified: Secondary | ICD-10-CM | POA: Diagnosis present

## 2018-08-29 DIAGNOSIS — D631 Anemia in chronic kidney disease: Secondary | ICD-10-CM | POA: Diagnosis present

## 2018-08-29 DIAGNOSIS — H9190 Unspecified hearing loss, unspecified ear: Secondary | ICD-10-CM | POA: Diagnosis present

## 2018-08-29 DIAGNOSIS — R319 Hematuria, unspecified: Secondary | ICD-10-CM | POA: Diagnosis present

## 2018-08-29 DIAGNOSIS — Z7982 Long term (current) use of aspirin: Secondary | ICD-10-CM

## 2018-08-29 DIAGNOSIS — I129 Hypertensive chronic kidney disease with stage 1 through stage 4 chronic kidney disease, or unspecified chronic kidney disease: Secondary | ICD-10-CM | POA: Diagnosis present

## 2018-08-29 DIAGNOSIS — Z8679 Personal history of other diseases of the circulatory system: Secondary | ICD-10-CM

## 2018-08-29 DIAGNOSIS — Z885 Allergy status to narcotic agent status: Secondary | ICD-10-CM | POA: Diagnosis not present

## 2018-08-29 DIAGNOSIS — Z6831 Body mass index (BMI) 31.0-31.9, adult: Secondary | ICD-10-CM | POA: Diagnosis not present

## 2018-08-29 DIAGNOSIS — I2510 Atherosclerotic heart disease of native coronary artery without angina pectoris: Secondary | ICD-10-CM | POA: Diagnosis present

## 2018-08-29 DIAGNOSIS — M722 Plantar fascial fibromatosis: Secondary | ICD-10-CM | POA: Diagnosis present

## 2018-08-29 DIAGNOSIS — Z882 Allergy status to sulfonamides status: Secondary | ICD-10-CM

## 2018-08-29 DIAGNOSIS — Z79899 Other long term (current) drug therapy: Secondary | ICD-10-CM

## 2018-08-29 DIAGNOSIS — E871 Hypo-osmolality and hyponatremia: Principal | ICD-10-CM | POA: Diagnosis present

## 2018-08-29 DIAGNOSIS — E669 Obesity, unspecified: Secondary | ICD-10-CM | POA: Diagnosis present

## 2018-08-29 DIAGNOSIS — N183 Chronic kidney disease, stage 3 (moderate): Secondary | ICD-10-CM | POA: Diagnosis present

## 2018-08-29 DIAGNOSIS — M1710 Unilateral primary osteoarthritis, unspecified knee: Secondary | ICD-10-CM | POA: Diagnosis present

## 2018-08-29 DIAGNOSIS — E039 Hypothyroidism, unspecified: Secondary | ICD-10-CM | POA: Diagnosis present

## 2018-08-29 DIAGNOSIS — F329 Major depressive disorder, single episode, unspecified: Secondary | ICD-10-CM | POA: Diagnosis present

## 2018-08-29 DIAGNOSIS — H538 Other visual disturbances: Secondary | ICD-10-CM

## 2018-08-29 DIAGNOSIS — E8881 Metabolic syndrome: Secondary | ICD-10-CM | POA: Diagnosis present

## 2018-08-29 DIAGNOSIS — Z888 Allergy status to other drugs, medicaments and biological substances status: Secondary | ICD-10-CM

## 2018-08-29 DIAGNOSIS — F419 Anxiety disorder, unspecified: Secondary | ICD-10-CM | POA: Diagnosis present

## 2018-08-29 DIAGNOSIS — Z79890 Hormone replacement therapy (postmenopausal): Secondary | ICD-10-CM

## 2018-08-29 LAB — CBC
HCT: 31.3 % — ABNORMAL LOW (ref 36.0–46.0)
Hemoglobin: 10.7 g/dL — ABNORMAL LOW (ref 12.0–15.0)
MCH: 30.1 pg (ref 26.0–34.0)
MCHC: 34.2 g/dL (ref 30.0–36.0)
MCV: 88.2 fL (ref 80.0–100.0)
Platelets: 294 10*3/uL (ref 150–400)
RBC: 3.55 MIL/uL — ABNORMAL LOW (ref 3.87–5.11)
RDW: 11.3 % — ABNORMAL LOW (ref 11.5–15.5)
WBC: 6.1 10*3/uL (ref 4.0–10.5)
nRBC: 0 % (ref 0.0–0.2)

## 2018-08-29 LAB — URINALYSIS, COMPLETE (UACMP) WITH MICROSCOPIC
BACTERIA UA: NONE SEEN
Bilirubin Urine: NEGATIVE
Glucose, UA: NEGATIVE mg/dL
Ketones, ur: NEGATIVE mg/dL
LEUKOCYTES UA: NEGATIVE
Nitrite: NEGATIVE
Protein, ur: 30 mg/dL — AB
Specific Gravity, Urine: 1.002 — ABNORMAL LOW (ref 1.005–1.030)
pH: 7 (ref 5.0–8.0)

## 2018-08-29 LAB — COMPREHENSIVE METABOLIC PANEL
ALK PHOS: 76 U/L (ref 38–126)
ALT: 12 U/L (ref 0–44)
ANION GAP: 10 (ref 5–15)
AST: 24 U/L (ref 15–41)
Albumin: 3.5 g/dL (ref 3.5–5.0)
BUN: 12 mg/dL (ref 8–23)
CO2: 23 mmol/L (ref 22–32)
Calcium: 8.8 mg/dL — ABNORMAL LOW (ref 8.9–10.3)
Chloride: 86 mmol/L — ABNORMAL LOW (ref 98–111)
Creatinine, Ser: 1.24 mg/dL — ABNORMAL HIGH (ref 0.44–1.00)
GFR calc Af Amer: 50 mL/min — ABNORMAL LOW (ref >60)
GFR calc non Af Amer: 43 mL/min — ABNORMAL LOW (ref >60)
Glucose, Bld: 107 mg/dL — ABNORMAL HIGH (ref 70–99)
POTASSIUM: 4 mmol/L (ref 3.5–5.1)
Sodium: 119 mmol/L — CL (ref 135–145)
Total Bilirubin: 0.8 mg/dL (ref 0.3–1.2)
Total Protein: 6.6 g/dL (ref 6.5–8.1)

## 2018-08-29 LAB — SODIUM, URINE, RANDOM: Sodium, Ur: 22 mmol/L

## 2018-08-29 LAB — CREATININE, URINE, RANDOM: Creatinine, Urine: 25 mg/dL

## 2018-08-29 LAB — TROPONIN I: Troponin I: <0.03 ng/mL (ref <0.03)

## 2018-08-29 MED ORDER — CLONAZEPAM 0.5 MG PO TABS: 0.2500 mg | ORAL_TABLET | Freq: Two times a day (BID) | ORAL | Status: DC | PRN

## 2018-08-29 MED ORDER — ALBUTEROL SULFATE (2.5 MG/3ML) 0.083% IN NEBU: 2.5000 mg | INHALATION_SOLUTION | RESPIRATORY_TRACT | Status: DC | PRN

## 2018-08-29 MED ORDER — IRBESARTAN 150 MG PO TABS
300.0000 mg | ORAL_TABLET | ORAL | Status: AC
  Administered 2018-08-29: 300 mg via ORAL
  Filled 2018-08-29: qty 2

## 2018-08-29 MED ORDER — NITROGLYCERIN 0.4 MG SL SUBL: 0.4000 mg | SUBLINGUAL_TABLET | SUBLINGUAL | Status: DC | PRN

## 2018-08-29 MED ORDER — CITALOPRAM HYDROBROMIDE 20 MG PO TABS
10.0000 mg | ORAL_TABLET | Freq: Every day | ORAL | Status: DC
  Administered 2018-08-30 – 2018-09-01 (×3): 10 mg via ORAL
  Filled 2018-08-29 (×3): qty 1

## 2018-08-29 MED ORDER — ONDANSETRON HCL 4 MG PO TABS: 4.0000 mg | ORAL_TABLET | Freq: Four times a day (QID) | ORAL | Status: DC | PRN

## 2018-08-29 MED ORDER — LORATADINE 10 MG PO TABS
10.0000 mg | ORAL_TABLET | Freq: Every day | ORAL | Status: DC
  Administered 2018-08-30 – 2018-09-01 (×3): 10 mg via ORAL
  Filled 2018-08-29 (×3): qty 1

## 2018-08-29 MED ORDER — BISACODYL 5 MG PO TBEC: 5.0000 mg | DELAYED_RELEASE_TABLET | Freq: Every day | ORAL | Status: DC | PRN

## 2018-08-29 MED ORDER — HYDROXYZINE HCL 25 MG PO TABS
25.0000 mg | ORAL_TABLET | Freq: Four times a day (QID) | ORAL | Status: DC | PRN
  Administered 2018-08-31: 25 mg via ORAL
  Filled 2018-08-29: qty 1

## 2018-08-29 MED ORDER — IRBESARTAN 150 MG PO TABS
300.0000 mg | ORAL_TABLET | Freq: Every day | ORAL | Status: DC
  Administered 2018-08-30 – 2018-09-01 (×3): 300 mg via ORAL
  Filled 2018-08-29 (×3): qty 2

## 2018-08-29 MED ORDER — LORAZEPAM 1 MG PO TABS
1.0000 mg | ORAL_TABLET | Freq: Two times a day (BID) | ORAL | Status: DC | PRN
  Administered 2018-08-30: 1 mg via ORAL
  Filled 2018-08-29: qty 1

## 2018-08-29 MED ORDER — LEVOTHYROXINE SODIUM 50 MCG PO TABS
75.0000 ug | ORAL_TABLET | Freq: Every day | ORAL | Status: DC
  Administered 2018-08-30 – 2018-09-01 (×3): 75 ug via ORAL
  Filled 2018-08-29 (×4): qty 2

## 2018-08-29 MED ORDER — ENOXAPARIN SODIUM 40 MG/0.4ML ~~LOC~~ SOLN
40.0000 mg | SUBCUTANEOUS | Status: DC
  Administered 2018-08-29 – 2018-08-31 (×3): 40 mg via SUBCUTANEOUS
  Filled 2018-08-29 (×3): qty 0.4

## 2018-08-29 MED ORDER — LABETALOL HCL 100 MG PO TABS
100.0000 mg | ORAL_TABLET | Freq: Two times a day (BID) | ORAL | Status: DC
  Administered 2018-08-29 – 2018-09-01 (×3): 100 mg via ORAL
  Filled 2018-08-29 (×8): qty 1

## 2018-08-29 MED ORDER — SODIUM CHLORIDE 0.9 % IV BOLUS
1000.0000 mL | Freq: Once | INTRAVENOUS | Status: AC
  Administered 2018-08-29: 1000 mL via INTRAVENOUS

## 2018-08-29 MED ORDER — SENNOSIDES-DOCUSATE SODIUM 8.6-50 MG PO TABS: 1.0000 | ORAL_TABLET | Freq: Every evening | ORAL | Status: DC | PRN

## 2018-08-29 MED ORDER — ASPIRIN EC 81 MG PO TBEC
81.0000 mg | DELAYED_RELEASE_TABLET | Freq: Every day | ORAL | Status: DC
  Administered 2018-08-30 – 2018-09-01 (×3): 81 mg via ORAL
  Filled 2018-08-29 (×3): qty 1

## 2018-08-29 MED ORDER — HYDRALAZINE HCL 20 MG/ML IJ SOLN
10.0000 mg | Freq: Four times a day (QID) | INTRAMUSCULAR | Status: DC | PRN
  Administered 2018-08-29: 10 mg via INTRAVENOUS
  Filled 2018-08-29: qty 1

## 2018-08-29 MED ORDER — ONDANSETRON HCL 4 MG/2ML IJ SOLN: 4.0000 mg | Freq: Four times a day (QID) | INTRAMUSCULAR | Status: DC | PRN

## 2018-08-29 MED ORDER — CLONIDINE HCL 0.1 MG PO TABS
0.2000 mg | ORAL_TABLET | Freq: Two times a day (BID) | ORAL | Status: DC
  Administered 2018-08-29 – 2018-08-31 (×5): 0.2 mg via ORAL
  Filled 2018-08-29 (×5): qty 2

## 2018-08-29 MED ORDER — CLONIDINE HCL 0.1 MG PO TABS
0.2000 mg | ORAL_TABLET | ORAL | Status: AC
  Administered 2018-08-29: 0.2 mg via ORAL
  Filled 2018-08-29: qty 2

## 2018-08-29 NOTE — H&P (Signed)
[place] [facility] [place] NAME: Betty Nielsen    MR#:  [id number]  DATE OF BIRTH:  [birth year]  DATE OF ADMISSION:  08/29/2018  PRIMARY CARE PHYSICIAN: Steele Sizer, MD   REQUESTING/REFERRING PHYSICIAN: Dr. Joni Fears.  CHIEF COMPLAINT:   Chief Complaint  Patient presents with  . Blurred Vision   Blurred vision and leg cramps HISTORY OF PRESENT ILLNESS:  Betty Nielsen  is a 72 y.o. female with a known history of CAD, CKD stage III, hypertension, hyperlipidemia, loss of hearing, metabolic syndrome and hyponatremia.  The patient presents the ED with above chief complaints.  She also complains of generalized weakness and dizziness.  She has history of hyponatremia.  She usually drink a lot of water and tea. Sodium level is low at 119 today.  ED physician request admission. PAST MEDICAL HISTORY:   Past Medical History:  Diagnosis Date  . Arthrosis of knee   . Chronic kidney disease   . Hyperlipidemia   . Hypertension   . Loss of hearing   . Metabolic syndrome   . Plantar fasciitis   . Renal insufficiency     PAST SURGICAL HISTORY:   Past Surgical History:  Procedure Laterality Date  . ABDOMINAL AORTIC ANEURYSM REPAIR    . ABDOMINAL HYSTERECTOMY    . APPENDECTOMY    . CORONARY STENT INTERVENTION N/A 10/26/2016   Procedure: Coronary Stent Intervention;  Surgeon: Yolonda Kida, MD;  Location: [place] [facility];  Service: Cardiovascular;  Laterality: N/A;  . HERNIA REPAIR    . INNER EAR SURGERY     pt not sure of type  . LEFT HEART CATH AND CORONARY ANGIOGRAPHY N/A 10/26/2016   Procedure: Left Heart Cath and Coronary Angiography;  Surgeon: Teodoro Spray, MD;  Location: [place] [facility];  Service: Cardiovascular;  Laterality: N/A;  . THYROIDECTOMY      SOCIAL HISTORY:   Social History   Tobacco Use  . Smoking status: Former Smoker    Packs/day: 0.50    Years: 20.00    Pack years: 10.00    Types: Cigarettes    Last  attempt to quit: 2000    Years since quitting: 19.9  . Smokeless tobacco: Never Used  . Tobacco comment: smoking cessation materials not required  Substance Use Topics  . Alcohol use: No    Alcohol/week: 0.0 standard drinks    FAMILY HISTORY:   Family History  Problem Relation Age of Onset  . Healthy Mother   . Cerebral aneurysm Father     DRUG ALLERGIES:   Allergies  Allergen Reactions  . Ace Inhibitors Cough  . Ciprofloxacin Other (See Comments)    unknown  . Hydrocodone-Acetaminophen Other (See Comments)    unknown  . Norvasc [Amlodipine Besylate]     Pruritus   . Pantoprazole Sodium Other (See Comments)    Cramps  . Pravastatin Other (See Comments)    Stomach cramps  . Sulfa Antibiotics     unknown    REVIEW OF SYSTEMS:   Review of Systems  Constitutional: Positive for malaise/fatigue. Negative for chills and fever.  HENT: Negative for sore throat.   Eyes: Positive for blurred vision. Negative for double vision.  Respiratory: Negative for cough, hemoptysis, shortness of breath, wheezing and stridor.   Cardiovascular: Negative for chest pain, palpitations, orthopnea and leg swelling.  Gastrointestinal: Negative for abdominal pain, blood in stool, diarrhea, melena, nausea and vomiting.  Genitourinary: Negative for dysuria, flank pain and  hematuria.  Musculoskeletal: Negative for back pain and joint pain.       Leg cramps  Neurological: Positive for dizziness. Negative for sensory change, focal weakness, seizures, loss of consciousness, weakness and headaches.  Endo/Heme/Allergies: Negative for polydipsia.  Psychiatric/Behavioral: Negative for depression. The patient is nervous/anxious.     MEDICATIONS AT HOME:   Prior to Admission medications   Medication Sig Start Date End Date Taking? Authorizing Provider  acetaminophen (TYLENOL) 500 MG tablet Take 500 mg by mouth every 6 (six) hours as needed.    [provider]  aspirin 81 MG tablet Take 81 mg  by mouth daily.    [provider]  cetirizine (ZYRTEC) 10 MG tablet Take 1 tablet (10 mg total) by mouth daily. Patient taking differently: Take 10 mg by mouth 2 (two) times daily.  11/01/17   Steele Sizer, MD  Cholecalciferol (VITAMIN D-1000 MAX ST) 1000 units tablet Take by mouth.    [provider]  citalopram (CELEXA) 10 MG tablet Take 1 tablet (10 mg total) by mouth daily. 08/10/18   Steele Sizer, MD  clonazePAM (KLONOPIN) 0.5 MG tablet Take 0.5 tablets (0.25 mg total) by mouth 2 (two) times daily as needed for anxiety. 08/06/18 08/06/19  Carrie Mew, MD  cloNIDine (CATAPRES) 0.2 MG tablet Take 0.2 mg by mouth 2 (two) times daily.    Lavonia Dana, MD  Garlic 6387 MG CAPS Take by mouth.    [provider]  hydrOXYzine (ATARAX/VISTARIL) 10 MG tablet Take 1 tablet (10 mg total) by mouth daily as needed. 08/10/18   Steele Sizer, MD  hydrOXYzine (ATARAX/VISTARIL) 25 MG tablet Take 1 tablet (25 mg total) by mouth every 6 (six) hours as needed for anxiety. 08/22/18   Maudie Flakes, MD  irbesartan (AVAPRO) 300 MG tablet Take 1 tablet (300 mg total) by mouth daily. 12/14/16   Steele Sizer, MD  Iron-Vitamins (GERITOL COMPLETE) TABS Take by mouth.    [provider]  labetalol (NORMODYNE) 100 MG tablet Take 1 tablet by mouth 2 (two) times daily.  10/27/17   Callwood, Dwayne D, MD  levothyroxine (SYNTHROID, LEVOTHROID) 75 MCG tablet Take 1 tablet (75 mcg total) by mouth daily before breakfast. 04/03/18   Ancil Boozer, Drue Stager, MD  LORazepam (ATIVAN) 1 MG tablet Take 1 tablet (1 mg total) by mouth 2 (two) times daily as needed for anxiety. 08/22/18   Maudie Flakes, MD  MULTIPLE VITAMIN PO Take 1 capsule by mouth daily.    [provider]  nitroGLYCERIN (NITROSTAT) 0.4 MG SL tablet Place 1 tablet (0.4 mg total) under the tongue every 5 (five) minutes as needed for chest pain. 10/27/16   Theodoro Grist, MD  Omega-3 Fatty Acids (FISH OIL) 1000 MG CPDR  Take 2 capsules by mouth daily.    [provider]      VITAL SIGNS:  Blood pressure (!) 172/79, pulse 66, temperature 98.5 F (36.9 C), temperature source Oral, resp. rate 18, height 5\' 3"  (1.6 m), weight 81.6 kg, SpO2 100 %.  PHYSICAL EXAMINATION:  Physical Exam  GENERAL:  73 y.o.-year-old patient lying in the bed with no acute distress.  Obesity. EYES: Pupils equal, round, reactive to light and accommodation. No scleral icterus. Extraocular muscles intact.  HEENT: Head atraumatic, normocephalic. Oropharynx and nasopharynx clear.  NECK:  Supple, no jugular venous distention. No thyroid enlargement, no tenderness.  LUNGS: Normal breath sounds bilaterally, no wheezing, rales,rhonchi or crepitation. No use of accessory muscles of respiration.  CARDIOVASCULAR: S1, S2  normal. No murmurs, rubs, or gallops.  ABDOMEN: Soft, nontender, nondistended. Bowel sounds present. No organomegaly or mass.  EXTREMITIES: No pedal edema, cyanosis, or clubbing.  NEUROLOGIC: Cranial nerves II through XII are intact. Muscle strength 5/5 in all extremities. Sensation intact. Gait not checked.  PSYCHIATRIC: The patient is alert and oriented x 3.  SKIN: No obvious rash, lesion, or ulcer.   LABORATORY PANEL:   CBC Recent Labs  Lab 08/29/18 1442  WBC 6.1  HGB 10.7*  HCT 31.3*  PLT 294   ------------------------------------------------------------------------------------------------------------------  Chemistries  Recent Labs  Lab 08/29/18 1442  NA 119*  K 4.0  CL 86*  CO2 23  GLUCOSE 107*  BUN 12  CREATININE 1.24*  CALCIUM 8.8*  AST 24  ALT 12  ALKPHOS 76  BILITOT 0.8   ------------------------------------------------------------------------------------------------------------------  Cardiac Enzymes Recent Labs  Lab 08/29/18 1442  TROPONINI <0.03   ------------------------------------------------------------------------------------------------------------------  RADIOLOGY:   No results found.    IMPRESSION AND PLAN:   Hyponatremia. The patient will be admitted to medical floor. Start normal saline IV, regular diet, fluid restriction and follow-up sodium level.  Accelerated hypertension.  Continue home hypertension medication, IV hydralazine PRN.  CKD stage III.  Stable. CAD.  Continue aspirin. Obesity.  Exercise and diet control.  Follow-up PCP.  All the records are reviewed and case discussed with ED provider. Management plans discussed with the patient, family and they are in agreement.  CODE STATUS: Full code  TOTAL TIME TAKING CARE OF THIS PATIENT: 35 minutes.    Demetrios Loll M.D on 08/29/2018 at 5:26 PM  Between 7am to 6pm - Pager - [phone]  After 6pm go to [email or web address] - Technical brewer [place] [facility]  Office  [phone]  CC: Primary care physician; Steele Sizer, MD   Note: This dictation was prepared with Dragon dictation along with smaller phrase technology. Any transcriptional errors that result from this process are unin

## 2018-08-29 NOTE — ED Triage Notes (Signed)
Says she has hazy vision which is how she know sodium is low again.  Declines wheelchair.  In nad.

## 2018-08-29 NOTE — ED Notes (Signed)
Report called to [place], floor reports pts BP too high for floor to take. Per Maudie Mercury, RN, supervisor will be contacted due to issue. Charge, Amy notified. BP medications given within last 45 mins.

## 2018-08-29 NOTE — ED Triage Notes (Signed)
Pt arrived via POV with reports of hazy vision that she states when her sodium drops she has hazy vision and gets anxious.   Pt ambulatory in lobby without difficulty.

## 2018-08-29 NOTE — ED Notes (Signed)
Pt ambulatory to bathroom with standby assist.

## 2018-08-29 NOTE — ED Notes (Signed)
Discussed with Dr. Jimmye Norman, new orders received for labs only at this time, see orders.

## 2018-08-29 NOTE — ED Notes (Signed)
Date and time results received: 08/29/18    Test: Sodium Critical Value:119  Name of Provider Notified: Paduchowski   Orders Received? Or Actions Taken?: Charge made aware-waiting for bed

## 2018-08-29 NOTE — ED Notes (Signed)
Hospitalist to bedside at this time 

## 2018-08-29 NOTE — ED Provider Notes (Addendum)
[facility] Emergency Department Provider Note  ____________________________________________  Time seen: Approximately 4:40 PM  I have reviewed the triage vital signs and the nursing notes.   HISTORY  Chief Complaint Blurred Vision    HPI Betty Nielsen is a 72 y.o. female with a history of hypertension, CKD, CAD, hyponatremia who presents with blurry vision, dizziness, generalized weakness, gradually worsening over the past week.  This is her fourth ED visit in the past month for various complaints including anxiety, sadness, and nonspecific chest and abdominal pain.  Review of electronic medical record shows baseline sodium level to be in the range of 1 28-1 35.  Denies falls or head injury.      Past Medical History:  Diagnosis Date  . Arthrosis of knee   . Chronic kidney disease   . Hyperlipidemia   . Hypertension   . Loss of hearing   . Metabolic syndrome   . Plantar fasciitis   . Renal insufficiency      Patient Active Problem List   Diagnosis Date Noted  . Malnutrition of mild degree ([place]) 06/16/2017  . PMR (polymyalgia rheumatica) ([place]) 06/15/2017  . Elevated troponin 10/27/2016  . Essential hypertension, malignant 10/27/2016  . Headache 10/27/2016  . CKD (chronic kidney disease), stage III ([place]) 10/27/2016  . Hyperlipidemia 10/27/2016  . Hyponatremia 10/27/2016  . Angina pectoris ([place]) 10/25/2016  . History of non-ST elevation myocardial infarction (NSTEMI) 10/25/2016  . History of coronary angioplasty 10/25/2016  . BPV (benign positional vertigo) 06/15/2016  . 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[place]) 11/21/2015  . Anemia of chronic disease 06/05/2015  . Acid reflux 06/05/2015  . Hypertension, benign 06/05/2015  . Hypothyroidism, 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  . Major depression in remission ([place]) 06/05/2015  . Obesity (BMI 30.0-34.9) 06/05/2015  . Neuropathy 06/05/2015  . Arthritis, degenerative 07/03/2014  . AAA (abdominal  aortic aneurysm) without rupture ([place]) 06/26/2014     Past Surgical History:  Procedure Laterality Date  . ABDOMINAL AORTIC ANEURYSM REPAIR    . ABDOMINAL HYSTERECTOMY    . APPENDECTOMY    . CORONARY STENT INTERVENTION N/A 10/26/2016   Procedure: Coronary Stent Intervention;  Surgeon: Yolonda Kida, MD;  Location: [facility];  Service: Cardiovascular;  Laterality: N/A;  . HERNIA REPAIR    . INNER EAR SURGERY     pt not sure of type  . LEFT HEART CATH AND CORONARY ANGIOGRAPHY N/A 10/26/2016   Procedure: Left Heart Cath and Coronary Angiography;  Surgeon: Teodoro Spray, MD;  Location: [facility];  Service: Cardiovascular;  Laterality: N/A;  . THYROIDECTOMY       Prior to Admission medications   Medication Sig Start Date End Date Taking? Authorizing Provider  acetaminophen (TYLENOL) 500 MG tablet Take 500 mg by mouth every 6 (six) hours as needed.    [provider]  aspirin 81 MG tablet Take 81 mg by mouth daily.    [provider]  cetirizine (ZYRTEC) 10 MG tablet Take 1 tablet (10 mg total) by mouth daily. Patient taking differently: Take 10 mg by mouth 2 (two) times daily.  11/01/17   Steele Sizer, MD  Cholecalciferol (VITAMIN D-1000 MAX ST) 1000 units tablet Take by mouth.    [provider]  citalopram (CELEXA) 10 MG tablet Take 1 tablet (10 mg total) by mouth daily. 08/10/18   Steele Sizer, MD  clonazePAM (KLONOPIN) 0.5 MG tablet Take 0.5 tablets (0.25 mg total) by mouth 2 (two) times daily as needed  for anxiety. 08/06/18 08/06/19  Carrie Mew, MD  cloNIDine (CATAPRES) 0.2 MG tablet Take 0.2 mg by mouth 2 (two) times daily.    Lavonia Dana, MD  Garlic 7616 MG CAPS Take by mouth.    [provider]  hydrOXYzine (ATARAX/VISTARIL) 10 MG tablet Take 1 tablet (10 mg total) by mouth daily as needed. 08/10/18   Steele Sizer, MD  hydrOXYzine (ATARAX/VISTARIL) 25 MG tablet Take 1 tablet (25 mg total) by mouth every 6  (six) hours as needed for anxiety. 08/22/18   Maudie Flakes, MD  irbesartan (AVAPRO) 300 MG tablet Take 1 tablet (300 mg total) by mouth daily. 12/14/16   Steele Sizer, MD  Iron-Vitamins (GERITOL COMPLETE) TABS Take by mouth.    [provider]  labetalol (NORMODYNE) 100 MG tablet Take 1 tablet by mouth 2 (two) times daily.  10/27/17   [place], Dwayne D, MD  levothyroxine (SYNTHROID, LEVOTHROID) 75 MCG tablet Take 1 tablet (75 mcg total) by mouth daily before breakfast. 04/03/18   Ancil Boozer, Drue Stager, MD  LORazepam (ATIVAN) 1 MG tablet Take 1 tablet (1 mg total) by mouth 2 (two) times daily as needed for anxiety. 08/22/18   Maudie Flakes, MD  MULTIPLE VITAMIN PO Take 1 capsule by mouth daily.    [provider]  nitroGLYCERIN (NITROSTAT) 0.4 MG SL tablet Place 1 tablet (0.4 mg total) under the tongue every 5 (five) minutes as needed for chest pain. 10/27/16   Theodoro Grist, MD  Omega-3 Fatty Acids (FISH OIL) 1000 MG CPDR Take 2 capsules by mouth daily.    [provider]     Allergies Ace inhibitors; Ciprofloxacin; Hydrocodone-acetaminophen; Norvasc [amlodipine besylate]; Pantoprazole sodium; Pravastatin; and Sulfa antibiotics   Family History  Problem Relation Age of Onset  . Healthy Mother   . Cerebral aneurysm Father     Social History Social History   Tobacco Use  . Smoking status: Former Smoker    Packs/day: 0.50    Years: 20.00    Pack years: 10.00    Types: Cigarettes    Last attempt to quit: 2000    Years since quitting: 19.9  . Smokeless tobacco: Never Used  . Tobacco comment: smoking cessation materials not required  Substance Use Topics  . Alcohol use: No    Alcohol/week: 0.0 standard drinks  . Drug use: No    Review of Systems  Constitutional:   No fever or chills.  ENT:   No sore throat. No rhinorrhea. Cardiovascular:   No chest pain or syncope. Respiratory:   No dyspnea or cough. Gastrointestinal:   Negative for abdominal pain,  vomiting and diarrhea.  Musculoskeletal:   Negative for focal pain or swelling.  Positive for episodic leg cramps All other systems reviewed and are negative except as documented above in ROS and HPI.  ____________________________________________   PHYSICAL EXAM:  VITAL SIGNS: ED Triage Vitals  Enc Vitals Group     BP 08/29/18 1424 (!) 172/79     Pulse Rate 08/29/18 1424 66     Resp 08/29/18 1424 18     Temp 08/29/18 1424 98.5 F (36.9 C)     Temp Source 08/29/18 1424 Oral     SpO2 08/29/18 1424 100 %     Weight 08/29/18 1425 180 lb (81.6 kg)     Height 08/29/18 1425 5\' 3"  (1.6 m)     Head Circumference --      Peak Flow --      Pain Score 08/29/18 1429  0     Pain Loc --      Pain Edu? --      Excl. in [place]? --     Vital signs reviewed, nursing assessments reviewed.   Constitutional:   Alert and oriented. Non-toxic appearance. Eyes:   Conjunctivae are normal. EOMI. PERRL. ENT      Head:   Normocephalic and atraumatic.      Nose:   No congestion/rhinnorhea.       Mouth/Throat:   MMM, no pharyngeal erythema. No peritonsillar mass.       Neck:   No meningismus. Full ROM. Hematological/Lymphatic/Immunilogical:   No cervical lymphadenopathy. Cardiovascular:   RRR. Symmetric bilateral radial and DP pulses.  No murmurs. Cap refill less than 2 seconds. Respiratory:   Normal respiratory effort without tachypnea/retractions. Breath sounds are clear and equal bilaterally. No wheezes/rales/rhonchi. Gastrointestinal:   Soft and nontender. Non distended. There is no CVA tenderness.  No rebound, rigidity, or guarding.  Musculoskeletal:   Normal range of motion in all extremities. No joint effusions.  No lower extremity tenderness.  No edema. Neurologic:   Normal speech and language.  Motor grossly intact. No acute focal neurologic deficits are appreciated.  Skin:    Skin is warm, dry and intact. No rash noted.  No petechiae, purpura, or  bullae.  ____________________________________________    LABS (pertinent positives/negatives) (all labs ordered are listed, but only abnormal results are displayed) Labs Reviewed  CBC - Abnormal; Notable for the following components:      Result Value   RBC 3.55 (*)    Hemoglobin 10.7 (*)    HCT 31.3 (*)    RDW 11.3 (*)    All other components within normal limits  COMPREHENSIVE METABOLIC PANEL - Abnormal; Notable for the following components:   Sodium 119 (*)    Chloride 86 (*)    Glucose, Bld 107 (*)    Creatinine, Ser 1.24 (*)    Calcium 8.8 (*)    GFR calc non Af Amer 43 (*)    GFR calc Af Amer 50 (*)    All other components within normal limits  TROPONIN I  URINALYSIS, COMPLETE (UACMP) WITH MICROSCOPIC  SODIUM, URINE, RANDOM  CREATININE, URINE, RANDOM   ____________________________________________   EKG  Interpreted by me Sinus bradycardia rate of 56, normal axis and intervals.  Voltage criteria for LVH in the high lateral leads.  Normal ST segments and T waves.  No ischemic changes.  ____________________________________________    RADIOLOGY  No results found.  ____________________________________________   PROCEDURES .Critical Care Performed by: Carrie Mew, MD Authorized by: Carrie Mew, MD   Critical care provider statement:    Critical care time (minutes):  30   Critical care time was exclusive of:  Separately billable procedures and treating other patients   Critical care was necessary to treat or prevent imminent or life-threatening deterioration of the following conditions:  CNS failure or compromise and metabolic crisis   Critical care was time spent personally by me on the following activities:  Development of treatment plan with patient or surrogate, discussions with consultants, evaluation of patient's response to treatment, examination of patient, obtaining history from patient or surrogate, ordering and performing treatments and  interventions, ordering and review of laboratory studies, ordering and review of radiographic studies, pulse oximetry, re-evaluation of patient's condition and review of old charts    ____________________________________________    CLINICAL IMPRESSION / [place] / ED COURSE  Pertinent labs & imaging results that  were available during my care of the patient were reviewed by me and considered in my medical decision making (see chart for details).    Patient presents with worsening weakness and blurry vision.  Labs show worsening hyponatremia compared to previous.  In September her sodium level was normal.  This is significantly lower than her sodium has ever been looking back over the past 10 years.  Reviewing the electronic medical record and talking the patient, it is unclear that she has any known adrenal or intrinsic renal disease that could be causing this.  Given her multiple ED visits recently and attempts to follow-up with primary care, seems that this issue is no longer manageable in the outpatient setting and she will need hospitalization for further work-up and stabilization to prevent neurologic deterioration.  Clinical Course as of Aug 30 1639  Tue Aug 29, 2018  1627 Labs show severe hyponatremia, worse than it is previously been.  Compared to September when her sodium level was normal at 135.  Hemoglobin shows stable chronic anemia.  Troponin negative.  Creatinine improved from baseline CKD.  Start IV saline for sodium replacement.  Plan to admit given worsening sodium and neurologic symptoms.   [PS]    Clinical Course User Index [PS] Carrie Mew, MD     ____________________________________________   FINAL CLINICAL IMPRESSION(S) / ED DIAGNOSES    Final diagnoses:  Hyponatremia  Blurry vision     ED Discharge Orders    None      Portions of this note were generated with dragon dictation software. Dictation errors may occur despite best attempts  at proofreading.    Carrie Mew, MD 08/29/18 1645    Carrie Mew, MD 08/29/18 [phone]

## 2018-08-29 NOTE — Progress Notes (Signed)
Advanced Care Plan.  Purpose of Encounter: CODE STATUS. Parties in Attendance: The patient, RN and me. Patient's Decisional Capacity: Yes. Medical Story: Betty Nielsen  is a 72 y.o. female with a known history of CAD, CKD stage III, hypertension, hyperlipidemia, loss of hearing, metabolic syndrome and hyponatremia.  I discussed the patient current condition, prognosis and CODE STATUS.  The patient want to be resuscitated and intubated if she has cardiopulmonary arrest. Plan:  Code Status: Full code. Time spent discussing advance care planning: 17 minutes.

## 2018-08-30 LAB — BASIC METABOLIC PANEL
Anion gap: 9 (ref 5–15)
BUN: 12 mg/dL (ref 8–23)
CO2: 20 mmol/L — ABNORMAL LOW (ref 22–32)
Calcium: 8.8 mg/dL — ABNORMAL LOW (ref 8.9–10.3)
Chloride: 94 mmol/L — ABNORMAL LOW (ref 98–111)
Creatinine, Ser: 1.27 mg/dL — ABNORMAL HIGH (ref 0.44–1.00)
GFR calc Af Amer: 49 mL/min — ABNORMAL LOW (ref >60)
GFR calc non Af Amer: 42 mL/min — ABNORMAL LOW (ref >60)
Glucose, Bld: 98 mg/dL (ref 70–99)
Potassium: 3.7 mmol/L (ref 3.5–5.1)
Sodium: 123 mmol/L — ABNORMAL LOW (ref 135–145)

## 2018-08-30 MED ORDER — SODIUM CHLORIDE 0.9 % IV SOLN
INTRAVENOUS | Status: DC
  Administered 2018-08-30 – 2018-08-31 (×3): via INTRAVENOUS

## 2018-08-31 ENCOUNTER — Ambulatory Visit: Payer: Medicare Other | Admitting: Family Medicine

## 2018-08-31 LAB — BASIC METABOLIC PANEL
Anion gap: 10 (ref 5–15)
BUN: 14 mg/dL (ref 8–23)
CO2: 19 mmol/L — ABNORMAL LOW (ref 22–32)
CREATININE: 1.48 mg/dL — AB (ref 0.44–1.00)
Calcium: 8.6 mg/dL — ABNORMAL LOW (ref 8.9–10.3)
Chloride: 96 mmol/L — ABNORMAL LOW (ref 98–111)
GFR calc Af Amer: 41 mL/min — ABNORMAL LOW (ref >60)
GFR, EST NON AFRICAN AMERICAN: 35 mL/min — AB (ref >60)
Glucose, Bld: 116 mg/dL — ABNORMAL HIGH (ref 70–99)
POTASSIUM: 4 mmol/L (ref 3.5–5.1)
SODIUM: 125 mmol/L — AB (ref 135–145)

## 2018-08-31 NOTE — Progress Notes (Signed)
   08/31/18 1100  Clinical Encounter Type  Visited With Patient  Visit Type Initial;Spiritual support  Recommendations Follow-up if needed  Spiritual Encounters  Spiritual Needs Emotional   Initial visit to introduce the patient to pastoral care. Patient was receptive, but expressed no pressing spiritual or emotional needs at this time. Chaplain offered to return if needed.

## 2018-08-31 NOTE — Progress Notes (Signed)
[facility] at [place] NAME: Betty Nielsen    MR#:  [id number]  DATE OF BIRTH:  1946-01-22  SUBJECTIVE:  CHIEF COMPLAINT:   Chief Complaint  Patient presents with  . Blurred Vision   Came with weakness, noted hyponatremia, Improved some after IV fluids, still low. REVIEW OF SYSTEMS:  CONSTITUTIONAL: No fever,have fatigue or weakness.  EYES: No blurred or double vision.  EARS, NOSE, AND THROAT: No tinnitus or ear pain.  RESPIRATORY: No cough, shortness of breath, wheezing or hemoptysis.  CARDIOVASCULAR: No chest pain, orthopnea, edema.  GASTROINTESTINAL: No nausea, vomiting, diarrhea or abdominal pain.  GENITOURINARY: No dysuria, hematuria.  ENDOCRINE: No polyuria, nocturia,  HEMATOLOGY: No anemia, easy bruising or bleeding SKIN: No rash or lesion. MUSCULOSKELETAL: No joint pain or arthritis.   NEUROLOGIC: No tingling, numbness, weakness.  PSYCHIATRY: No anxiety or depression.   ROS  DRUG ALLERGIES:   Allergies  Allergen Reactions  . Ace Inhibitors Cough  . Ciprofloxacin Other (See Comments)    unknown  . Hydrocodone-Acetaminophen Other (See Comments)    unknown  . Norvasc [Amlodipine Besylate]     Pruritus   . Pantoprazole Sodium Other (See Comments)    Cramps  . Pravastatin Other (See Comments)    Stomach cramps  . Sulfa Antibiotics     unknown    VITALS:  Blood pressure (!) 152/78, pulse (!) 58, temperature 98.2 F (36.8 C), temperature source Oral, resp. rate 20, height 5\' 3"  (1.6 m), weight 81.6 kg, SpO2 98 %.  PHYSICAL EXAMINATION:  GENERAL:  72 y.o.-year-old patient lying in the bed with no acute distress.  EYES: Pupils equal, round, reactive to light and accommodation. No scleral icterus. Extraocular muscles intact.  HEENT: Head atraumatic, normocephalic. Oropharynx and nasopharynx clear.  NECK:  Supple, no jugular venous distention. No thyroid enlargement, no tenderness.  LUNGS: Normal breath sounds bilaterally,  no wheezing, rales,rhonchi or crepitation. No use of accessory muscles of respiration.  CARDIOVASCULAR: S1, S2 normal. No murmurs, rubs, or gallops.  ABDOMEN: Soft, nontender, nondistended. Bowel sounds present. No organomegaly or mass.  EXTREMITIES: No pedal edema, cyanosis, or clubbing.  NEUROLOGIC: Cranial nerves II through XII are intact. Muscle strength 4/5 in all extremities. Sensation intact. Gait not checked.  PSYCHIATRIC: The patient is alert and oriented x 3.  SKIN: No obvious rash, lesion, or ulcer.   Physical Exam LABORATORY PANEL:   CBC Recent Labs  Lab 08/29/18 1442  WBC 6.1  HGB 10.7*  HCT 31.3*  PLT 294   ------------------------------------------------------------------------------------------------------------------  Chemistries  Recent Labs  Lab 08/29/18 1442 08/30/18 0334  NA 119* 123*  K 4.0 3.7  CL 86* 94*  CO2 23 20*  GLUCOSE 107* 98  BUN 12 12  CREATININE 1.24* 1.27*  CALCIUM 8.8* 8.8*  AST 24  --   ALT 12  --   ALKPHOS 76  --   BILITOT 0.8  --    ------------------------------------------------------------------------------------------------------------------  Cardiac Enzymes Recent Labs  Lab 08/29/18 1442  TROPONINI <0.03   ------------------------------------------------------------------------------------------------------------------  RADIOLOGY:  No results found.  ASSESSMENT AND PLAN:   Active Problems:   Hyponatremia  * Hyponatremia.  normal saline IV, regular diet, fluid restriction and follow-up sodium level.  * Accelerated hypertension.  Continue home hypertension medication, IV hydralazine PRN.  better now.  * CKD stage III.  Stable. * CAD.  Continue aspirin. * Obesity.  Exercise and diet control.  Follow-up PCP.   All the records are reviewed and case  discussed with Care Management/Social Workerr. Management plans discussed with the patient, family and they are in agreement.  CODE STATUS: Full.  TOTAL  TIME TAKING CARE OF THIS PATIENT: 35 minutes.    POSSIBLE D/C IN 1-2 DAYS, DEPENDING ON CLINICAL CONDITION.   Vaughan Basta M.D on 08/31/2018   Between 7am to 6pm - Pager - [phone]  After 6pm go to [email or web address] - password [email or web address]  Office  [phone]  CC: Primary care physician; Steele Sizer, MD  Note: This dictation was prepared with Dragon dictation along with smaller phrase technology. Any transcriptional errors that result from this process are unintentional.

## 2018-08-31 NOTE — Plan of Care (Signed)
?  Problem: Coping: ?Goal: Level of anxiety will decrease ?Outcome: Progressing ?  ?Problem: Safety: ?Goal: Ability to remain free from injury will improve ?Outcome: Progressing ?  ?

## 2018-09-01 LAB — BASIC METABOLIC PANEL
Anion gap: 7 (ref 5–15)
BUN: 14 mg/dL (ref 8–23)
CO2: 20 mmol/L — ABNORMAL LOW (ref 22–32)
Calcium: 8.7 mg/dL — ABNORMAL LOW (ref 8.9–10.3)
Chloride: 102 mmol/L (ref 98–111)
Creatinine, Ser: 1.42 mg/dL — ABNORMAL HIGH (ref 0.44–1.00)
GFR calc Af Amer: 43 mL/min — ABNORMAL LOW (ref >60)
GFR calc non Af Amer: 37 mL/min — ABNORMAL LOW (ref >60)
Glucose, Bld: 97 mg/dL (ref 70–99)
Potassium: 4.3 mmol/L (ref 3.5–5.1)
Sodium: 129 mmol/L — ABNORMAL LOW (ref 135–145)

## 2018-09-01 MED ORDER — SODIUM CHLORIDE 1 G PO TABS: 1.0000 g | ORAL_TABLET | Freq: Two times a day (BID) | ORAL | 0 refills | Status: DC

## 2018-09-01 MED ORDER — HYDROXYZINE HCL 25 MG PO TABS: 25.0000 mg | ORAL_TABLET | Freq: Four times a day (QID) | ORAL | Status: DC | PRN

## 2018-09-01 MED ORDER — HYDROXYZINE HCL 25 MG PO TABS: 50.0000 mg | ORAL_TABLET | Freq: Four times a day (QID) | ORAL | Status: DC | PRN

## 2018-09-01 MED ORDER — CLONIDINE HCL 0.1 MG PO TABS
0.3000 mg | ORAL_TABLET | Freq: Three times a day (TID) | ORAL | Status: DC
  Administered 2018-09-01: 0.3 mg via ORAL
  Filled 2018-09-01: qty 3

## 2018-09-01 MED ORDER — SODIUM CHLORIDE 1 G PO TABS
1.0000 g | ORAL_TABLET | Freq: Two times a day (BID) | ORAL | Status: DC
  Filled 2018-09-01: qty 1

## 2018-09-01 NOTE — Progress Notes (Signed)
[facility] at [place] NAME: Betty Nielsen    MR#:  [id number]  DATE OF BIRTH:  05-15-1946  SUBJECTIVE:  CHIEF COMPLAINT:   Chief Complaint  Patient presents with  . Blurred Vision   Came with weakness, noted hyponatremia, Improved some after IV fluids, still low. REVIEW OF SYSTEMS:  CONSTITUTIONAL: No fever,have fatigue or weakness.  EYES: No blurred or double vision.  EARS, NOSE, AND THROAT: No tinnitus or ear pain.  RESPIRATORY: No cough, shortness of breath, wheezing or hemoptysis.  CARDIOVASCULAR: No chest pain, orthopnea, edema.  GASTROINTESTINAL: No nausea, vomiting, diarrhea or abdominal pain.  GENITOURINARY: No dysuria, hematuria.  ENDOCRINE: No polyuria, nocturia,  HEMATOLOGY: No anemia, easy bruising or bleeding SKIN: No rash or lesion. MUSCULOSKELETAL: No joint pain or arthritis.   NEUROLOGIC: No tingling, numbness, weakness.  PSYCHIATRY: No anxiety or depression.   ROS  DRUG ALLERGIES:   Allergies  Allergen Reactions  . Ace Inhibitors Cough  . Ciprofloxacin Other (See Comments)    unknown  . Hydrocodone-Acetaminophen Other (See Comments)    unknown  . Norvasc [Amlodipine Besylate]     Pruritus   . Pantoprazole Sodium Other (See Comments)    Cramps  . Pravastatin Other (See Comments)    Stomach cramps  . Sulfa Antibiotics     unknown    VITALS:  Blood pressure (!) 169/82, pulse 63, temperature 97.9 F (36.6 C), temperature source Oral, resp. rate 20, height 5\' 3"  (1.6 m), weight 81.6 kg, SpO2 99 %.  PHYSICAL EXAMINATION:  GENERAL:  72 y.o.-year-old patient lying in the bed with no acute distress.  EYES: Pupils equal, round, reactive to light and accommodation. No scleral icterus. Extraocular muscles intact.  HEENT: Head atraumatic, normocephalic. Oropharynx and nasopharynx clear.  NECK:  Supple, no jugular venous distention. No thyroid enlargement, no tenderness.  LUNGS: Normal breath sounds bilaterally, no  wheezing, rales,rhonchi or crepitation. No use of accessory muscles of respiration.  CARDIOVASCULAR: S1, S2 normal. No murmurs, rubs, or gallops.  ABDOMEN: Soft, nontender, nondistended. Bowel sounds present. No organomegaly or mass.  EXTREMITIES: No pedal edema, cyanosis, or clubbing.  NEUROLOGIC: Cranial nerves II through XII are intact. Muscle strength 4/5 in all extremities. Sensation intact. Gait not checked.  PSYCHIATRIC: The patient is alert and oriented x 3.  SKIN: No obvious rash, lesion, or ulcer.   Physical Exam LABORATORY PANEL:   CBC Recent Labs  Lab 08/29/18 1442  WBC 6.1  HGB 10.7*  HCT 31.3*  PLT 294   ------------------------------------------------------------------------------------------------------------------  Chemistries  Recent Labs  Lab 08/29/18 1442  09/01/18 0508  NA 119*   < > 129*  K 4.0   < > 4.3  CL 86*   < > 102  CO2 23   < > 20*  GLUCOSE 107*   < > 97  BUN 12   < > 14  CREATININE 1.24*   < > 1.42*  CALCIUM 8.8*   < > 8.7*  AST 24  --   --   ALT 12  --   --   ALKPHOS 76  --   --   BILITOT 0.8  --   --    < > = values in this interval not displayed.   ------------------------------------------------------------------------------------------------------------------  Cardiac Enzymes Recent Labs  Lab 08/29/18 1442  TROPONINI <0.03   ------------------------------------------------------------------------------------------------------------------  RADIOLOGY:  No results found.  ASSESSMENT AND PLAN:   Active Problems:   Hyponatremia  * Hyponatremia.  normal saline  IV, regular diet, fluid restriction and follow-up sodium level. Very slow to correct so will call nephrology consult.  * Accelerated hypertension.  Continue home hypertension medication, IV hydralazine PRN.  better now.  * CKD stage III.  Stable. * CAD.  Continue aspirin. * Obesity.  Exercise and diet control.  Follow-up PCP.   All the records are reviewed  and case discussed with Care Management/Social Workerr. Management plans discussed with the patient, family and they are in agreement.  CODE STATUS: Full.  TOTAL TIME TAKING CARE OF THIS PATIENT: 35 minutes.    POSSIBLE D/C IN 1-2 DAYS, DEPENDING ON CLINICAL CONDITION.   Vaughan Basta M.D on 09/01/2018   Between 7am to 6pm - Pager - [phone]  After 6pm go to [email or web address] - password [email or web address]  Office  [phone]  CC: Primary care physician; Steele Sizer, MD  Note: This dictation was prepared with Dragon dictation along with smaller phrase technology. Any transcriptional errors that result from this process are unintentional.

## 2018-09-01 NOTE — Progress Notes (Signed)
Discharge order received. Patient is alert and oriented. Vital signs stable . No signs of acute distress. Discharge instructions given. Patient verbalized understanding. No other issues noted at this time.   

## 2018-09-01 NOTE — Progress Notes (Signed)
[facility]  ROUNDING NOTE   Subjective:   [name] admitted to [facility] on 08/29/2018 for Hyponatremia [E87.1] Blurry vision [H53.8]  Patient seems to have been drinking lots of water. Sodium improved with saline infusion.   Objective:  Vital signs in last 24 hours:  Temp:  [98 F (36.7 C)-98.2 F (36.8 C)] 98.1 F (36.7 C) (12/13 0519) Pulse Rate:  [56-70] 61 (12/13 1058) Resp:  [16-20] 16 (12/13 0519) BP: (155-210)/(66-96) 186/71 (12/13 1058) SpO2:  [97 %-100 %] 97 % (12/13 1058)  Weight change:  Filed Weights   08/29/18 1425  Weight: 81.6 kg    Intake/Output: I/O last 3 completed shifts: In: 3320.4 [P.O.:1020; I.V.:2300.4] Out: 2400 [Urine:2400]   Intake/Output this shift:  No intake/output data recorded.  Physical Exam: General: NAD,   Head: Normocephalic, atraumatic. Moist oral mucosal membranes  Eyes: Anicteric, PERRL  Neck: Supple, trachea midline  Lungs:  Clear to auscultation  Heart: Regular rate and rhythm  Abdomen:  Soft, nontender,   Extremities: no peripheral edema.  Neurologic: Nonfocal, moving all four extremities  Skin: No lesions        Basic Metabolic Panel: Recent Labs  Lab 08/29/18 1442 08/30/18 0334 08/31/18 0954 09/01/18 0508  NA 119* 123* 125* 129*  K 4.0 3.7 4.0 4.3  CL 86* 94* 96* 102  CO2 23 20* 19* 20*  GLUCOSE 107* 98 116* 97  BUN 12 12 14 14   CREATININE 1.24* 1.27* 1.48* 1.42*  CALCIUM 8.8* 8.8* 8.6* 8.7*    Liver Function Tests: Recent Labs  Lab 08/29/18 1442  AST 24  ALT 12  ALKPHOS 76  BILITOT 0.8  PROT 6.6  ALBUMIN 3.5   No results for input(s): LIPASE, AMYLASE in the last 168 hours. No results for input(s): AMMONIA in the last 168 hours.  CBC: Recent Labs  Lab 08/29/18 1442  WBC 6.1  HGB 10.7*  HCT 31.3*  MCV 88.2  PLT 294    Cardiac Enzymes: Recent Labs  Lab 08/29/18 1442  TROPONINI <0.03    BNP: Invalid input(s): POCBNP  CBG: No results for input(s): GLUCAP  in the last 168 hours.  Microbiology: No results found for this or any previous visit.  Coagulation Studies: No results for input(s): LABPROT, INR in the last 72 hours.  Urinalysis: Recent Labs    08/29/18 1911  COLORURINE COLORLESS*  LABSPEC 1.002*  PHURINE 7.0  GLUCOSEU NEGATIVE  HGBUR MODERATE*  BILIRUBINUR NEGATIVE  KETONESUR NEGATIVE  PROTEINUR 30*  NITRITE NEGATIVE  LEUKOCYTESUR NEGATIVE      Imaging: No results found.   Medications:    . aspirin EC  81 mg Oral Daily  . citalopram  10 mg Oral Daily  . cloNIDine  0.3 mg Oral TID  . enoxaparin (LOVENOX) injection  40 mg Subcutaneous Q24H  . irbesartan  300 mg Oral Daily  . labetalol  100 mg Oral BID  . levothyroxine  75 mcg Oral Q0600  . loratadine  10 mg Oral Daily  . sodium chloride  1 g Oral BID WC   albuterol, bisacodyl, hydrALAZINE, hydrOXYzine, LORazepam, nitroGLYCERIN, ondansetron **OR** ondansetron (ZOFRAN) IV, senna-docusate  Assessment/ Plan:  Betty Nielsen is a 72 y.o. black female withwith hypertension, hypothyroidism, AAA repair, depression, hyperlipidemia, hearing loss and osteoarthritis  .  1. Hyponatremia:  - Continue to avoid thiazide diuretics.  - Off citalopram. Avoid SSRIs.  - Stop NS infusion, start PO sodium tablets. - May consider loop diuretic - outpatient labs  next week.   2. Chronic Kidney Disease stage III with proteinuria and hematuria: secondary to long standing hypertension.   - Continue irbesartan .   3. Hypertension: elevated. History of difficult to control. Not currently on diuretic.  - irbesartan, clonidine, and labetalol.   4. Anemia with chronic kidney disease: hemoglobin stable.    LOS: 3 Betty Nielsen 12/13/201911:26 AM

## 2018-09-01 NOTE — Care Management Important Message (Signed)
Important Message  Patient Details  Name: ANAIYAH ANGLEMYER MRN: [id number] Date of Birth: 14-May-1946   Medicare Important Message Given:  Yes    Juliann Pulse A Ivelis Norgard 09/01/2018, 11:03 AM

## 2018-09-01 NOTE — Discharge Summary (Signed)
[place] [facility] [place] NAME: [name]    MR#:  [id number]  DATE OF BIRTH:  13-Apr-1946  DATE OF ADMISSION:  08/29/2018 ADMITTING PHYSICIAN: Demetrios Loll, MD  DATE OF DISCHARGE: 09/01/2018  3:14 PM  PRIMARY CARE PHYSICIAN: Steele Sizer, MD    ADMISSION DIAGNOSIS:  Hyponatremia [E87.1] Blurry vision [H53.8]  DISCHARGE DIAGNOSIS:  Active Problems:   Hyponatremia  SECONDARY DIAGNOSIS:   Past Medical History:  Diagnosis Date  . Arthrosis of knee   . Chronic kidney disease   . Hyperlipidemia   . Hypertension   . Loss of hearing   . Metabolic syndrome   . Plantar fasciitis   . Renal insufficiency     HOSPITAL COURSE:   * Hyponatremia.  normal saline IV, regular diet, fluid restriction and follow-up sodium level. Very slow to correct so will call nephrology consult. Stopped citalopram and advised to avoid SSRI. Sodium level came up to 129.  Patient was feeling much better.  Nephrology suggested to discharge on oral sodium tablets and follow as outpatient in 1 week.  * Accelerated hypertension. Continue home hypertension medication, IV hydralazine PRN.  better now.  Avoid thiazides.  * CKD stage III. Stable. * CAD. Continue aspirin. * Obesity. Exercise and diet control. Follow-up PCP  DISCHARGE CONDITIONS:   Stable  CONSULTS OBTAINED:  Treatment Team:  Lavonia Dana, MD  DRUG ALLERGIES:   Allergies  Allergen Reactions  . Ace Inhibitors Cough  . Ciprofloxacin Other (See Comments)    unknown  . Hydrocodone-Acetaminophen Other (See Comments)    unknown  . Norvasc [Amlodipine Besylate]     Pruritus   . Pantoprazole Sodium Other (See Comments)    Cramps  . Pravastatin Other (See Comments)    Stomach cramps  . Sulfa Antibiotics     unknown    DISCHARGE MEDICATIONS:   Allergies as of 09/01/2018      Reactions   Ace Inhibitors Cough   Ciprofloxacin Other (See Comments)   unknown    Hydrocodone-acetaminophen Other (See Comments)   unknown   Norvasc [amlodipine Besylate]    Pruritus   Pantoprazole Sodium Other (See Comments)   Cramps   Pravastatin Other (See Comments)   Stomach cramps   Sulfa Antibiotics    unknown      Medication List    STOP taking these medications   citalopram 10 MG tablet Commonly known as:  CELEXA   clonazePAM 0.5 MG tablet Commonly known as:  KLONOPIN     TAKE these medications   acetaminophen 500 MG tablet Commonly known as:  TYLENOL Take 500 mg by mouth 2 (two) times daily.   aspirin 81 MG tablet Take 81 mg by mouth daily.   cetirizine 10 MG tablet Commonly known as:  ZYRTEC Take 1 tablet (10 mg total) by mouth daily. What changed:  when to take this   cloNIDine 0.2 MG tablet Commonly known as:  CATAPRES Take 0.2 mg by mouth 2 (two) times daily.   Fish Oil 1000 MG Cpdr Take 2 capsules by mouth daily.   Garlic 6073 MG Caps Take 1,000 mg by mouth daily.   GERITOL COMPLETE Tabs Take 1 tablet by mouth daily.   hydrOXYzine 25 MG tablet Commonly known as:  ATARAX/VISTARIL Take 1 tablet (25 mg total) by mouth every 6 (six) hours as needed for anxiety. What changed:  Another medication with the same name was removed. Continue taking this medication, and follow the directions you  see here.   irbesartan 300 MG tablet Commonly known as:  AVAPRO Take 1 tablet (300 mg total) by mouth daily.   labetalol 100 MG tablet Commonly known as:  NORMODYNE Take 1 tablet by mouth 2 (two) times daily.   levothyroxine 75 MCG tablet Commonly known as:  SYNTHROID, LEVOTHROID Take 1 tablet (75 mcg total) by mouth daily before breakfast.   LORazepam 1 MG tablet Commonly known as:  ATIVAN Take 1 tablet (1 mg total) by mouth 2 (two) times daily as needed for anxiety.   nitroGLYCERIN 0.4 MG SL tablet Commonly known as:  NITROSTAT Place 1 tablet (0.4 mg total) under the tongue every 5 (five) minutes as needed for chest pain.    sodium chloride 1 g tablet Take 1 tablet (1 g total) by mouth 2 (two) times daily with a meal.   VITAMIN D-1000 MAX ST 25 MCG (1000 UT) tablet Generic drug:  Cholecalciferol Take by mouth.        DISCHARGE INSTRUCTIONS:    Follow with nephrology clinic in 1 to 2 weeks.  If you experience worsening of your admission symptoms, develop shortness of breath, life threatening emergency, suicidal or homicidal thoughts you must seek medical attention immediately by calling 911 or calling your MD immediately  if symptoms less severe.  You Must read complete instructions/literature along with all the possible adverse reactions/side effects for all the Medicines you take and that have been prescribed to you. Take any new Medicines after you have completely understood and accept all the possible adverse reactions/side effects.   Please note  You were cared for by a hospitalist during your hospital stay. If you have any questions about your discharge medications or the care you received while you were in the hospital after you are discharged, you can call the unit and asked to speak with the hospitalist on call if the hospitalist that took care of you is not available. Once you are discharged, your primary care physician will handle any further medical issues. Please note that NO REFILLS for any discharge medications will be authorized once you are discharged, as it is imperative that you return to your primary care physician (or establish a relationship with a primary care physician if you do not have one) for your aftercare needs so that they can reassess your need for medications and monitor your lab values.    Today   CHIEF COMPLAINT:   Chief Complaint  Patient presents with  . Blurred Vision    HISTORY OF PRESENT ILLNESS:  Betty Nielsen  is a 72 y.o. female with a known history of CAD, CKD stage III, hypertension, hyperlipidemia, loss of hearing, metabolic syndrome and hyponatremia.   The patient presents the ED with above chief complaints.  She also complains of generalized weakness and dizziness.  She has history of hyponatremia.  She usually drink a lot of water and tea. Sodium level is low at 119 today.  ED physician request admission.   VITAL SIGNS:  Blood pressure (!) 169/82, pulse 63, temperature 97.9 F (36.6 C), temperature source Oral, resp. rate 20, height 5\' 3"  (1.6 m), weight 81.6 kg, SpO2 99 %.  I/O:    Intake/Output Summary (Last 24 hours) at 09/01/2018 1811 Last data filed at 09/01/2018 0530 Gross per 24 hour  Intake 1060.13 ml  Output 1900 ml  Net -839.87 ml    PHYSICAL EXAMINATION:  GENERAL:  72 y.o.-year-old patient lying in the bed with no acute distress.  EYES: Pupils equal,  round, reactive to light and accommodation. No scleral icterus. Extraocular muscles intact.  HEENT: Head atraumatic, normocephalic. Oropharynx and nasopharynx clear.  NECK:  Supple, no jugular venous distention. No thyroid enlargement, no tenderness.  LUNGS: Normal breath sounds bilaterally, no wheezing, rales,rhonchi or crepitation. No use of accessory muscles of respiration.  CARDIOVASCULAR: S1, S2 normal. No murmurs, rubs, or gallops.  ABDOMEN: Soft, non-tender, non-distended. Bowel sounds present. No organomegaly or mass.  EXTREMITIES: No pedal edema, cyanosis, or clubbing.  NEUROLOGIC: Cranial nerves II through XII are intact. Muscle strength 5/5 in all extremities. Sensation intact. Gait not checked.  PSYCHIATRIC: The patient is alert and oriented x 3.  SKIN: No obvious rash, lesion, or ulcer.   DATA REVIEW:   CBC Recent Labs  Lab 08/29/18 1442  WBC 6.1  HGB 10.7*  HCT 31.3*  PLT 294    Chemistries  Recent Labs  Lab 08/29/18 1442  09/01/18 0508  NA 119*   < > 129*  K 4.0   < > 4.3  CL 86*   < > 102  CO2 23   < > 20*  GLUCOSE 107*   < > 97  BUN 12   < > 14  CREATININE 1.24*   < > 1.42*  CALCIUM 8.8*   < > 8.7*  AST 24  --   --   ALT 12  --   --    ALKPHOS 76  --   --   BILITOT 0.8  --   --    < > = values in this interval not displayed.    Cardiac Enzymes Recent Labs  Lab 08/29/18 [place] <0.03    Microbiology Results  No results found for this or any previous visit.  RADIOLOGY:  No results found.  EKG:   Orders placed or performed during the hospital encounter of 08/29/18  . ED EKG  . ED EKG  . EKG      Management plans discussed with the patient, family and they are in agreement.  CODE STATUS:     Code Status Orders  (From admission, onward)         Start     Ordered   08/29/18 2103  Full code  Continuous     08/29/18 2102        Code Status History    Date Active Date Inactive Code Status Order ID Comments User Context   10/25/2016 1816 10/27/2016 1726 Full Code [id number]  Loletha Grayer, MD ED    Advance Directive Documentation     Most Recent Value  Type of Advance Directive  Living will, Healthcare Power of Attorney  Pre-existing out of facility DNR order (yellow form or pink MOST form)  -  "MOST" Form in Place?  -      TOTAL TIME TAKING CARE OF THIS PATIENT: 35 minutes.    Vaughan Basta M.D on 09/01/2018 at 6:11 PM  Between 7am to 6pm - Pager - [phone]  After 6pm go to [email or web address] - password [email or web address] [facility]  Office  [phone]  CC: Primary care physician; Steele Sizer, MD   Note: This dictation was prepared with Dragon dictation along with smaller phrase technology. Any transcriptional errors that result from this process are unintentional.

## 2018-09-04 ENCOUNTER — Telehealth: Payer: Self-pay

## 2018-09-04 NOTE — Telephone Encounter (Signed)
Transition Care Management Follow-up Telephone Call  Date of discharge and from where: 09/01/18 [facility]  How have you been since you were released from the hospital? Pt states she is doing okay, feeling a little anxious and nervous at times and cramping in toes and feet. Pt had a tough morning with feeling anxious and no appetite/nausea.   Any questions or concerns? No  followed up with Dr. Abigail Butts today for blood work. States she was feeling well yesterday until she had a bowel movement and now feels like her sodium is back down.   Items Reviewed:  Did the pt receive and understand the discharge instructions provided? Yes   Medications obtained and verified? Yes   - reviewed medications and patient was still taking citalopram for anxiety, advised pt to follow discharge instructions and take hydroxyzine for anxiety as directed. Pt was not taking lorazepam.  Any new allergies since your discharge? No   Dietary orders reviewed? Yes  Do you have support at home? Yes   Functional Questionnaire: (I = Independent and D = Dependent) ADLs: I  Bathing/Dressing- I  Meal Prep- I  Eating- I  Maintaining continence- I  Transferring/Ambulation- I  Managing Meds- I  Follow up appointments reviewed:   PCP Hospital f/u appt confirmed? Yes  pt had previously scheduled follow up appt scheduled with Dr. Ancil Boozer on 09/12/18  Specialist Hospital f/u appt confirmed? Yes  pt completed visit with Dr. Abigail Butts today.   Are transportation arrangements needed? No   If their condition worsens, is the pt aware to call PCP or go to the Emergency Dept.? Yes  Was the patient provided with contact information for the PCP's office or ED? Yes  Was to pt encouraged to call back with questions or concerns? Yes

## 2018-09-12 ENCOUNTER — Encounter: Payer: Self-pay | Admitting: Family Medicine

## 2018-09-12 ENCOUNTER — Ambulatory Visit (INDEPENDENT_AMBULATORY_CARE_PROVIDER_SITE_OTHER): Payer: Medicare Other | Admitting: Family Medicine

## 2018-09-12 VITALS — BP 152/86 | HR 80 | Temp 97.7°F | Resp 16 | Ht 63.0 in | Wt 181.0 lb

## 2018-09-12 DIAGNOSIS — N183 Chronic kidney disease, stage 3 unspecified: Secondary | ICD-10-CM

## 2018-09-12 DIAGNOSIS — E890 Postprocedural hypothyroidism: Secondary | ICD-10-CM

## 2018-09-12 DIAGNOSIS — I10 Essential (primary) hypertension: Secondary | ICD-10-CM | POA: Diagnosis not present

## 2018-09-12 DIAGNOSIS — Z09 Encounter for follow-up examination after completed treatment for conditions other than malignant neoplasm: Secondary | ICD-10-CM

## 2018-09-12 DIAGNOSIS — E871 Hypo-osmolality and hyponatremia: Secondary | ICD-10-CM

## 2018-09-12 MED ORDER — LEVOTHYROXINE SODIUM 75 MCG PO TABS: 75.0000 ug | ORAL_TABLET | Freq: Every day | ORAL | 6 refills | Status: DC

## 2018-09-12 NOTE — Progress Notes (Addendum)
Name: Betty Nielsen   MRN: [id number]    DOB: 1946/09/06   Date:09/12/2018       Progress Note  Subjective  Chief Complaint  Chief Complaint  Patient presents with  . Hospitalization Follow-up    for anxiety and low sodium    HPI  Hospital discharge follow up: she has a long history of hyponatremia and has been seeing nephrologist , Dr. Abigail Butts. She was not eating well because a tooth abscess that had to be abstracted. She felt very nervous and blurred vision and went to [facility] on 12/10, she was found to have severe hyponatremia with sodium level of 119. She was given IV fluids, she was advised to stop Citalopram and was sent home on Ativan and hydroxyzine to take prn only and also started on sodium pills upon discharge. Patient states had labs done at Dr. Marcy Salvo office since discharge. She is feeling better and appetite back to normal for the past 5 days. No blurred vision, dizziness, chest pain, headaches or palpitations. Discussed rechecking labs today but she states she will be seeing Dr. Abigail Butts next week and will have labs done there instead. She states last dose of hydroxizine was 5 days ago   HTN: bp is high today but not as high as during her hospital stay. BP managed by Dr. Abigail Butts, she has CKI but she is 150's, usually in the 130's/80's. She has follow up with nephrologist next week and we will hold off on adjusting medication at this time.   Hyperlipidemia: she refuses to take statin therapy, discussed risk of heart attack and strokes.   Hypothyroidism: she needs refill of medication, TSH done at [facility] was at goal, no change in bowel movements, no dry skin.    Patient Active Problem List   Diagnosis Date Noted  . Malnutrition of mild degree ([place]) 06/16/2017  . PMR (polymyalgia rheumatica) ([place]) 06/15/2017  . Elevated troponin 10/27/2016  . Essential hypertension, malignant 10/27/2016  . Headache 10/27/2016  . CKD (chronic kidney disease), stage III ([place]) 10/27/2016  .  Hyperlipidemia 10/27/2016  . Hyponatremia 10/27/2016  . Angina pectoris ([place]) 10/25/2016  . History of non-ST elevation myocardial infarction (NSTEMI) 10/25/2016  . History of coronary angioplasty 10/25/2016  . BPV (benign positional vertigo) 06/15/2016  . 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[place]) 11/21/2015  . Anemia of chronic disease 06/05/2015  . Acid reflux 06/05/2015  . Hypertension, benign 06/05/2015  . Hypothyroidism, 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  . Major depression in remission ([place]) 06/05/2015  . Obesity (BMI 30.0-34.9) 06/05/2015  . Neuropathy 06/05/2015  . Arthritis, degenerative 07/03/2014  . AAA (abdominal aortic aneurysm) without rupture ([place]) 06/26/2014    Past Surgical History:  Procedure Laterality Date  . ABDOMINAL AORTIC ANEURYSM REPAIR    . ABDOMINAL HYSTERECTOMY    . APPENDECTOMY    . CORONARY STENT INTERVENTION N/A 10/26/2016   Procedure: Coronary Stent Intervention;  Surgeon: Yolonda Kida, MD;  Location: [facility];  Service: Cardiovascular;  Laterality: N/A;  . HERNIA REPAIR    . INNER EAR SURGERY     pt not sure of type  . LEFT HEART CATH AND CORONARY ANGIOGRAPHY N/A 10/26/2016   Procedure: Left Heart Cath and Coronary Angiography;  Surgeon: Teodoro Spray, MD;  Location: [facility];  Service: Cardiovascular;  Laterality: N/A;  . THYROIDECTOMY      Family History  Problem Relation Age of Onset  . Healthy Mother   . Cerebral aneurysm Father     Social  History   Socioeconomic History  . Marital status: Widowed    Spouse name: Not on file  . Number of children: 1  . Years of education: GED  . Highest education level: 10th grade  Occupational History  . Occupation: Retired  Scientific laboratory technician  . Financial resource strain: Not hard at all  . Food insecurity:    Worry: Never true    Inability: Never true  . Transportation needs:    Medical: No    Non-medical: No  Tobacco Use  . Smoking status: Former Smoker    Packs/day: 0.50     Years: 20.00    Pack years: 10.00    Types: Cigarettes    Last attempt to quit: 2000    Years since quitting: 19.9  . Smokeless tobacco: Never Used  . Tobacco comment: smoking cessation materials not required  Substance and Sexual Activity  . Alcohol use: No    Alcohol/week: 0.0 standard drinks  . Drug use: No  . Sexual activity: Not Currently  Lifestyle  . Physical activity:    Days per week: 5 days    Minutes per session: 120 min  . Stress: Not at all  Relationships  . Social connections:    Talks on phone: More than three times a week    Gets together: Once a week    Attends religious service: More than 4 times per year    Active member of club or organization: Patient refused    Attends meetings of clubs or organizations: Patient refused    Relationship status: Widowed  . Intimate partner violence:    Fear of current or ex partner: No    Emotionally abused: No    Physically abused: No    Forced sexual activity: No  Other Topics Concern  . Not on file  Social History Narrative   Lives alone   Retired from working a mill      Current Outpatient Medications:  .  acetaminophen (TYLENOL) 500 MG tablet, Take 500 mg by mouth 2 (two) times daily. , Disp: , Rfl:  .  aspirin 81 MG tablet, Take 81 mg by mouth daily., Disp: , Rfl:  .  cetirizine (ZYRTEC) 10 MG tablet, Take 1 tablet (10 mg total) by mouth daily., Disp: 30 tablet, Rfl: 0 .  Cholecalciferol (VITAMIN D-1000 MAX ST) 1000 units tablet, Take by mouth., Disp: , Rfl:  .  cloNIDine (CATAPRES) 0.2 MG tablet, Take 0.2 mg by mouth 2 (two) times daily., Disp: , Rfl:  .  hydrOXYzine (ATARAX/VISTARIL) 25 MG tablet, Take 1 tablet (25 mg total) by mouth every 6 (six) hours as needed for anxiety., Disp: 30 tablet, Rfl: 0 .  irbesartan (AVAPRO) 300 MG tablet, Take 1 tablet (300 mg total) by mouth daily., Disp: 90 tablet, Rfl: 1 .  Iron-Vitamins (GERITOL COMPLETE) TABS, Take 1 tablet by mouth daily. , Disp: , Rfl:  .  labetalol  (NORMODYNE) 100 MG tablet, Take 1 tablet by mouth 2 (two) times daily. , Disp: , Rfl:  .  levothyroxine (SYNTHROID, LEVOTHROID) 75 MCG tablet, Take 1 tablet (75 mcg total) by mouth daily before breakfast., Disp: 30 tablet, Rfl: 6 .  LORazepam (ATIVAN) 1 MG tablet, Take 1 tablet (1 mg total) by mouth 2 (two) times daily as needed for anxiety., Disp: 10 tablet, Rfl: 0 .  nitroGLYCERIN (NITROSTAT) 0.4 MG SL tablet, Place 1 tablet (0.4 mg total) under the tongue every 5 (five) minutes as needed for chest pain., Disp: 20 tablet,  Rfl: 12 .  Omega-3 Fatty Acids (FISH OIL) 1000 MG CPDR, Take 2 capsules by mouth daily., Disp: , Rfl:  .  sodium chloride 1 g tablet, Take 1 tablet (1 g total) by mouth 2 (two) times daily with a meal., Disp: 30 tablet, Rfl: 0  Allergies  Allergen Reactions  . Ace Inhibitors Cough  . Ciprofloxacin Other (See Comments)    unknown  . Citalopram     hyponatremia  . Hydrocodone-Acetaminophen Other (See Comments)    unknown  . Norvasc [Amlodipine Besylate]     Pruritus   . Pantoprazole Sodium Other (See Comments)    Cramps  . Pravastatin Other (See Comments)    Stomach cramps  . Sulfa Antibiotics     unknown    I personally reviewed active problem list, medication list, allergies, family history, social history with the patient/caregiver today.   ROS  Constitutional: Negative for fever or weight change.  Respiratory: Negative for cough and shortness of breath.   Cardiovascular: Negative for chest pain or palpitations.  Gastrointestinal: Negative for abdominal pain, no bowel changes.  Musculoskeletal: Negative for gait problem or joint swelling.  Skin: Negative for rash.  Neurological: Negative for dizziness or headache.  No other specific complaints in a complete review of systems (except as listed in HPI above).  Objective  Vitals:   09/12/18 0836  BP: (!) 150/80  Pulse: 80  Resp: 16  Temp: 97.7 F (36.5 C)  TempSrc: Oral  SpO2: 98%  Weight: 181 lb  (82.1 kg)  Height: 5\' 3"  (1.6 m)    Body mass index is 32.06 kg/m.  Physical Exam  Constitutional: Patient appears well-developed and well-nourished. Obese  No distress.  HEENT: head atraumatic, normocephalic, pupils equal and reactive to light,  neck supple, throat within normal limits Cardiovascular: Normal rate, regular rhythm and normal heart sounds.  No murmur heard. No BLE edema. Pulmonary/Chest: Effort normal and breath sounds normal. No respiratory distress. Abdominal: Soft.  There is no tenderness. Psychiatric: Patient has a normal mood and affect. behavior is normal. Judgment and thought content normal.   Recent Results (from the past 2160 hour(s))  CBC with Differential/Platelet     Status: Abnormal   Collection Time: 06/16/18  2:20 PM  Result Value Ref Range   WBC 4.9 3.8 - 10.8 Thousand/uL   RBC 3.35 (L) 3.80 - 5.10 Million/uL   Hemoglobin 10.1 (L) 11.7 - 15.5 g/dL   HCT 30.9 (L) 35.0 - 45.0 %   MCV 92.2 80.0 - 100.0 fL   MCH 30.1 27.0 - 33.0 pg   MCHC 32.7 32.0 - 36.0 g/dL   RDW 12.2 11.0 - 15.0 %   Platelets 284 140 - 400 Thousand/uL   MPV 9.5 7.5 - 12.5 fL   Neutro Abs 2,622 1,500 - 7,800 cells/uL   Lymphs Abs 1,622 850 - 3,900 cells/uL   WBC mixed population 426 200 - 950 cells/uL   Eosinophils Absolute 191 15 - 500 cells/uL   Basophils Absolute 39 0 - 200 cells/uL   Neutrophils Relative % 53.5 %   Total Lymphocyte 33.1 %   Monocytes Relative 8.7 %   Eosinophils Relative 3.9 %   Basophils Relative 0.8 %  COMPLETE METABOLIC PANEL WITH GFR     Status: Abnormal   Collection Time: 06/16/18  2:20 PM  Result Value Ref Range   Glucose, Bld 114 65 - 139 mg/dL    Comment: .        Non-fasting reference interval .  BUN 13 7 - 25 mg/dL   Creat 1.39 (H) 0.60 - 0.93 mg/dL    Comment: For patients >35 years of age, the reference limit for Creatinine is approximately 13% higher for people identified as African-American. .    GFR, Est Non African American 38  (L) > OR = 60 mL/min/1.41m2   GFR, Est African American 44 (L) > OR = 60 mL/min/1.28m2   BUN/Creatinine Ratio 9 6 - 22 (calc)   Sodium 135 135 - 146 mmol/L   Potassium 4.1 3.5 - 5.3 mmol/L   Chloride 101 98 - 110 mmol/L   CO2 26 20 - 32 mmol/L   Calcium 9.2 8.6 - 10.4 mg/dL   Total Protein 6.0 (L) 6.1 - 8.1 g/dL   Albumin 3.7 3.6 - 5.1 g/dL   Globulin 2.3 1.9 - 3.7 g/dL (calc)   AG Ratio 1.6 1.0 - 2.5 (calc)   Total Bilirubin 0.4 0.2 - 1.2 mg/dL   Alkaline phosphatase (APISO) 90 33 - 130 U/L   AST 16 10 - 35 U/L   ALT 9 6 - 29 U/L  Lipid panel     Status: Abnormal   Collection Time: 06/16/18  2:20 PM  Result Value Ref Range   Cholesterol 323 (H) <200 mg/dL   HDL 61 >50 mg/dL   Triglycerides 187 (H) <150 mg/dL   LDL Cholesterol (Calc) 226 (H) mg/dL (calc)    Comment: LDL-C levels > or = 190 mg/dL may indicate familial  hypercholesterolemia (FH). Clinical assessment and  measurement of blood lipid levels should be  considered for all first degree relatives of  patients with an FH diagnosis.  For questions about testing for familial hypercholesterolemia, please call Insurance risk surveyor at [email or web address]. Duncan Dull, et al. J National Lipid Association  Recommendations for Patient-Centered Management of  Dyslipidemia: Part 1 Journal of Clinical Lipidology  2015;9(2), 129-169. Reference range: <100 . Desirable range <100 mg/dL for primary prevention;   <70 mg/dL for patients with CHD or diabetic patients  with > or = 2 CHD risk factors. Marland Kitchen LDL-C is now calculated using the Martin-Hopkins  calculation, which is a validated novel method providing  better accuracy than the Friedewald equation in the  estimation of LDL-C.  Cresenciano Genre et al. Annamaria Helling. 4270;623(76): 2061-2068  (http://education.QuestDiagnostics.com/faq/FAQ164)    Total CHOL/HDL Ratio 5.3 (H) <5.0 (calc)   Non-HDL Cholesterol (Calc) 262 (H) <130 mg/dL (calc)    Comment: Non-HDL level > or = 220 is very  high and may indicate  genetic familial hypercholesterolemia (FH). Clinical  assessment and measurement of blood lipid levels  should be considered for all first-degree relatives  of patients with an FH diagnosis. . For patients with diabetes plus 1 major ASCVD risk  factor, treating to a non-HDL-C goal of <100 mg/dL  (LDL-C of <70 mg/dL) is considered a therapeutic  option.   TSH     Status: None   Collection Time: 06/16/18  2:20 PM  Result Value Ref Range   TSH 0.62 0.40 - 4.50 mIU/L  Basic metabolic panel     Status: Abnormal   Collection Time: 08/21/18 12:09 PM  Result Value Ref Range   Sodium 125 (L) 135 - 145 mmol/L   Potassium 4.4 3.5 - 5.1 mmol/L   Chloride 92 (L) 98 - 111 mmol/L   CO2 23 22 - 32 mmol/L   Glucose, Bld 116 (H) 70 - 99 mg/dL   BUN 18 8 - 23 mg/dL   Creatinine, Ser 1.40 (  H) 0.44 - 1.00 mg/dL   Calcium 9.3 8.9 - 10.3 mg/dL   GFR calc non Af Amer 37 (L) >60 mL/min   GFR calc Af Amer 43 (L) >60 mL/min   Anion gap 10 5 - 15    Comment: Performed at [facility], [address] [place], [place] [address]  CBC     Status: Abnormal   Collection Time: 08/21/18 12:09 PM  Result Value Ref Range   WBC 5.1 4.0 - 10.5 K/uL   RBC 3.42 (L) 3.87 - 5.11 MIL/uL   Hemoglobin 10.4 (L) 12.0 - 15.0 g/dL   HCT 30.9 (L) 36.0 - 46.0 %   MCV 90.4 80.0 - 100.0 fL   MCH 30.4 26.0 - 34.0 pg   MCHC 33.7 30.0 - 36.0 g/dL   RDW 11.5 11.5 - 15.5 %   Platelets 261 150 - 400 K/uL   nRBC 0.0 0.0 - 0.2 %    Comment: Performed at [facility], [address] [place], [place] [address]  Troponin I - ONCE - STAT     Status: None   Collection Time: 08/21/18 12:09 PM  Result Value Ref Range   Troponin I <0.03 <0.03 ng/mL    Comment: Performed at [facility], [address] [place], [place] [address]  Hepatic function panel     Status: Abnormal   Collection Time: 08/21/18 12:09 PM  Result Value Ref Range   Total Protein 6.3 (L) 6.5 - 8.1 g/dL    Albumin 3.4 (L) 3.5 - 5.0 g/dL   AST 22 15 - 41 U/L   ALT 12 0 - 44 U/L   Alkaline Phosphatase 88 38 - 126 U/L   Total Bilirubin 0.6 0.3 - 1.2 mg/dL   Bilirubin, Direct <0.1 0.0 - 0.2 mg/dL   Indirect Bilirubin NOT CALCULATED 0.3 - 0.9 mg/dL    Comment: Performed at [facility], [address] [place], [place] [address]  Lipase, blood     Status: None   Collection Time: 08/21/18 12:09 PM  Result Value Ref Range   Lipase 28 11 - 51 U/L    Comment: Performed at [facility], [address] [place], [place] [address]  Troponin I - ONCE - STAT     Status: None   Collection Time: 08/21/18  3:54 PM  Result Value Ref Range   Troponin I <0.03 <0.03 ng/mL    Comment: Performed at [facility], [address] [place], [place] [address]  Urinalysis, Complete w Microscopic     Status: Abnormal   Collection Time: 08/21/18  4:45 PM  Result Value Ref Range   Color, Urine COLORLESS (A) YELLOW   APPearance CLEAR (A) CLEAR   Specific Gravity, Urine 1.008 1.005 - 1.030   pH 7.0 5.0 - 8.0   Glucose, UA NEGATIVE NEGATIVE mg/dL   Hgb urine dipstick MODERATE (A) NEGATIVE   Bilirubin Urine NEGATIVE NEGATIVE   Ketones, ur NEGATIVE NEGATIVE mg/dL   Protein, ur 100 (A) NEGATIVE mg/dL   Nitrite NEGATIVE NEGATIVE   Leukocytes, UA NEGATIVE NEGATIVE   RBC / HPF 0-5 0 - 5 RBC/hpf   WBC, UA 0-5 0 - 5 WBC/hpf   Bacteria, UA RARE (A) NONE SEEN   Squamous Epithelial / LPF 0-5 0 - 5   Mucus PRESENT     Comment: Performed at [facility], [address], [place], [place] [address]  Basic metabolic panel     Status: Abnormal  Collection Time: 08/21/18  5:45 PM  Result Value Ref Range   Sodium 126 (L) 135 - 145 mmol/L   Potassium 4.6 3.5 - 5.1 mmol/L   Chloride 96 (L) 98 - 111 mmol/L   CO2 21 (L) 22 - 32 mmol/L   Glucose, Bld 101 (H) 70 - 99 mg/dL   BUN 17 8 - 23 mg/dL   Creatinine, Ser 1.34 (H) 0.44 - 1.00 mg/dL   Calcium 8.9 8.9 - 10.3 mg/dL   GFR calc non Af  Amer 39 (L) >60 mL/min   GFR calc Af Amer 46 (L) >60 mL/min   Anion gap 9 5 - 15    Comment: Performed at [facility], [address] [place], [place] [address]  CBC     Status: Abnormal   Collection Time: 08/22/18 11:54 AM  Result Value Ref Range   WBC 4.8 4.0 - 10.5 K/uL   RBC 3.52 (L) 3.87 - 5.11 MIL/uL   Hemoglobin 10.6 (L) 12.0 - 15.0 g/dL   HCT 32.3 (L) 36.0 - 46.0 %   MCV 91.8 80.0 - 100.0 fL   MCH 30.1 26.0 - 34.0 pg   MCHC 32.8 30.0 - 36.0 g/dL   RDW 11.8 11.5 - 15.5 %   Platelets 290 150 - 400 K/uL   nRBC 0.0 0.0 - 0.2 %    Comment: Performed at [facility], [place] [address], [place], [place] [address]  Basic metabolic panel     Status: Abnormal   Collection Time: 08/22/18 11:54 AM  Result Value Ref Range   Sodium 126 (L) 135 - 145 mmol/L   Potassium 4.1 3.5 - 5.1 mmol/L   Chloride 95 (L) 98 - 111 mmol/L   CO2 22 22 - 32 mmol/L   Glucose, Bld 121 (H) 70 - 99 mg/dL   BUN 14 8 - 23 mg/dL   Creatinine, Ser 1.51 (H) 0.44 - 1.00 mg/dL   Calcium 9.5 8.9 - 10.3 mg/dL   GFR calc non Af Amer 34 (L) >60 mL/min   GFR calc Af Amer 40 (L) >60 mL/min   Anion gap 9 5 - 15    Comment: Performed at [facility] [address], [place], [place] [address]  TSH     Status: None   Collection Time: 08/22/18 11:54 AM  Result Value Ref Range   TSH 0.863 0.350 - 4.500 uIU/mL    Comment: Performed by a 3rd Generation assay with a functional sensitivity of <=0.01 uIU/mL. Performed at [facility], [place] [address] [place], [place] [address]   I-stat troponin, ED     Status: None   Collection Time: 08/22/18 12:00 PM  Result Value Ref Range   Troponin i, poc 0.01 0.00 - 0.08 ng/mL   Comment 3            Comment: Due to the release kinetics of cTnI, a negative result within the first hours of the onset of symptoms does not rule out myocardial infarction with certainty. If myocardial infarction is still suspected, repeat the test at appropriate intervals.    CBC     Status: Abnormal   Collection Time: 08/29/18  2:42 PM  Result Value Ref Range   WBC 6.1 4.0 - 10.5 K/uL   RBC 3.55 (L) 3.87 - 5.11 MIL/uL   Hemoglobin 10.7 (L) 12.0 - 15.0 g/dL   HCT 31.3 (L) 36.0 - 46.0 %   MCV 88.2 80.0 - 100.0 fL  MCH 30.1 26.0 - 34.0 pg   MCHC 34.2 30.0 - 36.0 g/dL   RDW 11.3 (L) 11.5 - 15.5 %   Platelets 294 150 - 400 K/uL   nRBC 0.0 0.0 - 0.2 %    Comment: Performed at [facility], [address] [place], [place] [address]  Comprehensive metabolic panel     Status: Abnormal   Collection Time: 08/29/18  2:42 PM  Result Value Ref Range   Sodium 119 (LL) 135 - 145 mmol/L    Comment: CRITICAL RESULT CALLED TO, READ BACK BY AND VERIFIED WITH COLLYN GILLESPY @1520  08/29/18 BY FMW    Potassium 4.0 3.5 - 5.1 mmol/L   Chloride 86 (L) 98 - 111 mmol/L   CO2 23 22 - 32 mmol/L   Glucose, Bld 107 (H) 70 - 99 mg/dL   BUN 12 8 - 23 mg/dL   Creatinine, Ser 1.24 (H) 0.44 - 1.00 mg/dL   Calcium 8.8 (L) 8.9 - 10.3 mg/dL   Total Protein 6.6 6.5 - 8.1 g/dL   Albumin 3.5 3.5 - 5.0 g/dL   AST 24 15 - 41 U/L   ALT 12 0 - 44 U/L   Alkaline Phosphatase 76 38 - 126 U/L   Total Bilirubin 0.8 0.3 - 1.2 mg/dL   GFR calc non Af Amer 43 (L) >60 mL/min   GFR calc Af Amer 50 (L) >60 mL/min   Anion gap 10 5 - 15    Comment: Performed at [facility], [address] [place], [place] [address]  Troponin I - ONCE - STAT     Status: None   Collection Time: 08/29/18  2:42 PM  Result Value Ref Range   Troponin I <0.03 <0.03 ng/mL    Comment: Performed at [facility], [address] [place], [place] [address]  Urinalysis, Complete w Microscopic     Status: Abnormal   Collection Time: 08/29/18  7:11 PM  Result Value Ref Range   Color, Urine COLORLESS (A) YELLOW   APPearance CLEAR (A) CLEAR   Specific Gravity, Urine 1.002 (L) 1.005 - 1.030   pH 7.0 5.0 - 8.0   Glucose, UA NEGATIVE NEGATIVE mg/dL   Hgb urine dipstick MODERATE (A) NEGATIVE    Bilirubin Urine NEGATIVE NEGATIVE   Ketones, ur NEGATIVE NEGATIVE mg/dL   Protein, ur 30 (A) NEGATIVE mg/dL   Nitrite NEGATIVE NEGATIVE   Leukocytes, UA NEGATIVE NEGATIVE   RBC / HPF 0-5 0 - 5 RBC/hpf   WBC, UA 0-5 0 - 5 WBC/hpf   Bacteria, UA NONE SEEN NONE SEEN   Squamous Epithelial / LPF 0-5 0 - 5    Comment: Performed at [facility], [address] [place], [place] [address]  Sodium, urine, random     Status: None   Collection Time: 08/29/18  7:11 PM  Result Value Ref Range   Sodium, Ur 22 mmol/L    Comment: Performed at [facility], [address] [place], [place] [address]  Creatinine, urine, random     Status: None   Collection Time: 08/29/18  7:11 PM  Result Value Ref Range   Creatinine, Urine 25 mg/dL    Comment: Performed at [facility], [address] [place],  [address]  Basic metabolic panel     Status: Abnormal   Collection Time: 08/30/18  3:34 AM  Result Value Ref Range   Sodium 123 (L) 135 - 145 mmol/L   Potassium 3.7 3.5 - 5.1 mmol/L  Chloride 94 (L) 98 - 111 mmol/L   CO2 20 (L) 22 - 32 mmol/L   Glucose, Bld 98 70 - 99 mg/dL   BUN 12 8 - 23 mg/dL   Creatinine, Ser 1.27 (H) 0.44 - 1.00 mg/dL   Calcium 8.8 (L) 8.9 - 10.3 mg/dL   GFR calc non Af Amer 42 (L) >60 mL/min   GFR calc Af Amer 49 (L) >60 mL/min   Anion gap 9 5 - 15    Comment: Performed at [facility], [address] [place], [place] [address]  Basic metabolic panel     Status: Abnormal   Collection Time: 08/31/18  9:54 AM  Result Value Ref Range   Sodium 125 (L) 135 - 145 mmol/L   Potassium 4.0 3.5 - 5.1 mmol/L   Chloride 96 (L) 98 - 111 mmol/L   CO2 19 (L) 22 - 32 mmol/L   Glucose, Bld 116 (H) 70 - 99 mg/dL   BUN 14 8 - 23 mg/dL   Creatinine, Ser 1.48 (H) 0.44 - 1.00 mg/dL   Calcium 8.6 (L) 8.9 - 10.3 mg/dL   GFR calc non Af Amer 35 (L) >60 mL/min   GFR calc Af Amer 41 (L) >60 mL/min   Anion gap 10 5 - 15    Comment: Performed at  [facility], [address] [place], [place] [address]  Basic metabolic panel     Status: Abnormal   Collection Time: 09/01/18  5:08 AM  Result Value Ref Range   Sodium 129 (L) 135 - 145 mmol/L   Potassium 4.3 3.5 - 5.1 mmol/L   Chloride 102 98 - 111 mmol/L   CO2 20 (L) 22 - 32 mmol/L   Glucose, Bld 97 70 - 99 mg/dL   BUN 14 8 - 23 mg/dL   Creatinine, Ser 1.42 (H) 0.44 - 1.00 mg/dL   Calcium 8.7 (L) 8.9 - 10.3 mg/dL   GFR calc non Af Amer 37 (L) >60 mL/min   GFR calc Af Amer 43 (L) >60 mL/min   Anion gap 7 5 - 15    Comment: Performed at [facility], [address] [place], [place] [address]     PHQ2/9: Depression screen [facility] 2/9 09/12/2018 08/10/2018 06/16/2018 03/30/2018 11/01/2017  Decreased Interest 0 0 0 0 0  Down, Depressed, Hopeless 0 0 0 0 0  PHQ - 2 Score 0 0 0 0 0  Altered sleeping 2 0 0 0 -  Tired, decreased energy 0 0 0 0 -  Change in appetite 0 0 0 0 -  Feeling bad or failure about yourself  0 0 0 0 -  Trouble concentrating 0 0 0 0 -  Moving slowly or fidgety/restless 0 0 0 0 -  Suicidal thoughts 0 0 0 0 -  PHQ-9 Score 2 0 0 0 -  Difficult doing work/chores Somewhat difficult Not difficult at all - Not difficult at all -     Fall Risk: Fall Risk  09/12/2018 08/10/2018 08/10/2018 06/16/2018 03/30/2018  Falls in the past year? 0 0 0 No No  Number falls in past yr: 0 0 - - -  Injury with Fall? 0 0 - - -  Risk for fall due to : - - - - Impaired vision  Risk for fall due to: Comment - - - - wears eyeglasses but unable to see out of them. Refused referral to be schedule for eye exam     Assessment & Plan  1. Hospital discharge follow-up   2. Hypothyroidism, postsurgical  - levothyroxine (SYNTHROID, LEVOTHROID) 75 MCG tablet; Take 1 tablet (75 mcg total) by mouth daily before breakfast.  Dispense: 30 tablet; Refill: 6  3. Hypertension, benign  Keep follow up with Dr. Abigail Butts next week   4. CKD (chronic kidney disease), stage III  (HCC)  stable  5. Hyponatremia  Cause of last admission, on sodium tablets now

## 2018-09-25 LAB — HEPATIC FUNCTION PANEL
ALK PHOS: 87 (ref 25–125)
ALT: 8 (ref 7–35)
AST: 15 (ref 13–35)

## 2018-09-26 LAB — BASIC METABOLIC PANEL
BUN: 15 (ref 4–21)
Creatinine: 1.5 — AB (ref 0.5–1.1)
Glucose: 86
Potassium: 4.4 (ref 3.4–5.3)
Sodium: 133 — AB (ref 137–147)

## 2018-09-27 ENCOUNTER — Encounter: Payer: Self-pay | Admitting: Family Medicine

## 2018-10-19 ENCOUNTER — Other Ambulatory Visit: Payer: Self-pay | Admitting: Family Medicine

## 2018-10-19 NOTE — Telephone Encounter (Signed)
Copied from [place]. Topic: Quick Communication - Rx Refill/Question >> Oct 19, 2018  2:13 PM Reyne Dumas L wrote: Medication: hydrOXYzine (ATARAX/VISTARIL) 25 MG tablet  Has the patient contacted their pharmacy? Yes - states that they do not have anything from [place] (Agent: If no, request that the patient contact the pharmacy for the refill.) (Agent: If yes, when and what did the pharmacy advise?)  Preferred Pharmacy (with phone number or street name): [facility] (N), [place] [phone] (Phone) [phone] (Fax)  Agent: Please be advised that RX refills may take up to 3 business days. We ask that you follow-up with your pharmacy.

## 2018-10-20 MED ORDER — HYDROXYZINE HCL 25 MG PO TABS: 25.0000 mg | ORAL_TABLET | Freq: Four times a day (QID) | ORAL | 0 refills | Status: DC | PRN

## 2018-10-20 NOTE — Telephone Encounter (Signed)
Refill request for general medication. Hydroxyzine   Last office visit 09/12/2018   Follow up on 12/18/2018

## 2018-12-18 ENCOUNTER — Encounter: Payer: Self-pay | Admitting: Family Medicine

## 2018-12-18 ENCOUNTER — Ambulatory Visit (INDEPENDENT_AMBULATORY_CARE_PROVIDER_SITE_OTHER): Payer: Medicare Other | Admitting: Family Medicine

## 2018-12-18 ENCOUNTER — Other Ambulatory Visit: Payer: Self-pay

## 2018-12-18 ENCOUNTER — Ambulatory Visit: Payer: Medicare Other | Admitting: Family Medicine

## 2018-12-18 DIAGNOSIS — M353 Polymyalgia rheumatica: Secondary | ICD-10-CM | POA: Diagnosis not present

## 2018-12-18 DIAGNOSIS — I700 Atherosclerosis of aorta: Secondary | ICD-10-CM | POA: Diagnosis not present

## 2018-12-18 DIAGNOSIS — E890 Postprocedural hypothyroidism: Secondary | ICD-10-CM

## 2018-12-18 DIAGNOSIS — F410 Panic disorder [episodic paroxysmal anxiety] without agoraphobia: Secondary | ICD-10-CM

## 2018-12-18 DIAGNOSIS — F325 Major depressive disorder, single episode, in full remission: Secondary | ICD-10-CM | POA: Diagnosis not present

## 2018-12-18 DIAGNOSIS — I209 Angina pectoris, unspecified: Secondary | ICD-10-CM

## 2018-12-18 DIAGNOSIS — E785 Hyperlipidemia, unspecified: Secondary | ICD-10-CM

## 2018-12-18 DIAGNOSIS — R0981 Nasal congestion: Secondary | ICD-10-CM | POA: Diagnosis not present

## 2018-12-18 DIAGNOSIS — N183 Chronic kidney disease, stage 3 unspecified: Secondary | ICD-10-CM

## 2018-12-18 DIAGNOSIS — E871 Hypo-osmolality and hyponatremia: Secondary | ICD-10-CM

## 2018-12-18 NOTE — Progress Notes (Signed)
Name: Betty Nielsen   MRN: [id number]    DOB: Nov 26, 1945   Date:12/18/2018       Progress Note  Subjective  Chief Complaint  No chief complaint on file.   I connected with@ on 12/18/18 at 11:00 AM EDT by telephone and verified that I am speaking with the correct person using two identifiers.  I discussed the limitations, risks, security and privacy concerns of performing an evaluation and management service by telephone and the availability of in person appointments. Staff also discussed with the patient that there may be a patient responsible charge related to this service. Patient Location: home  Provider Location: [facility]    HPI   CKI III/ Hyponatremia: stable kidney function when looked at labs done at [facility] 10/2018 by Dr. Abigail Butts and GFR was 40, sodium stable at 133 . Good urine output, she denies pruritus, no nausea or vomiting.   HTN/CAD/Angina: bp is being managed by Dr. Abigail Butts. At home her bp has been 140's/70's  She has not used any NTG since Feb 2018 ( when she had MI).She is off plavix, still taking aspirin daily, no easy bleeding.   PMR: use to see  Dr. Jefm Bryant, tender joints and muscles all over, worse on hips and sometimes shoulders.  taking tylenol prn pain. Pain right now is low when warm, but today pain is 2/10   Atherosclerosis abdominal aorta/hyperlipidemia: unable to tolerate statin,currently on 81 mg aspirin she denies easy bleeding. She also has a history of aneurysmal of aorta repair done by Dr. Doren Custard ( vascular surgeon in [place]). Unchanged   Hyperlipidemia: LDL dangerously high, back in September LDL was 226 , but patient is not willing to try statin, too scared of side effects, she has a history of MI and still not willing. She also has atherosclerosis of abdominal aorta, discussed risk of strokes and heart attacks, explained we have other agents besides statins, ( including Zetia or PSK9  ) to improve LDL, but she refuses that also    Hypothyroidism: she is s/p thyroidectomy was having labs done by Dr. Pryor Ochoa, Compliant with medication, denies dry skin, states constipation has been controlled with Miralax. Last TSHwas at goal, she is taking Synthroid 75 mcg daily, last TSH was done in 05/2018 and it was at normal range Recheck on her next visit   Depression:she is off SSRI because of hyponatremia, but states she is feeling well, in remission.She had an episode of psychosis in the past. She denies sadness, crying spells, change in appetite, or suicidal thoughts.Phq 9 today was zero    Patient Active Problem List   Diagnosis Date Noted  . PMR (polymyalgia rheumatica) ([place]) 06/15/2017  . Elevated troponin 10/27/2016  . Essential hypertension, malignant 10/27/2016  . Headache 10/27/2016  . CKD (chronic kidney disease), stage III ([place]) 10/27/2016  . Hyperlipidemia 10/27/2016  . Hyponatremia 10/27/2016  . Angina pectoris ([place]) 10/25/2016  . History of non-ST elevation myocardial infarction (NSTEMI) 10/25/2016  . History of coronary angioplasty 10/25/2016  . BPV (benign positional vertigo) 06/15/2016  . 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[place]) 11/21/2015  . Anemia of chronic disease 06/05/2015  . Acid reflux 06/05/2015  . Hypertension, benign 06/05/2015  . Hypothyroidism, 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  . Major depression in remission ([place]) 06/05/2015  . Obesity (BMI 30.0-34.9) 06/05/2015  . Neuropathy 06/05/2015  . Arthritis, degenerative 07/03/2014  . AAA (abdominal aortic aneurysm) without rupture ([place]) 06/26/2014    Past Surgical History:  Procedure Laterality Date  . ABDOMINAL AORTIC ANEURYSM  REPAIR    . ABDOMINAL HYSTERECTOMY    . APPENDECTOMY    . CORONARY STENT INTERVENTION N/A 10/26/2016   Procedure: Coronary Stent Intervention;  Surgeon: Yolonda Kida, MD;  Location: [facility];  Service: Cardiovascular;  Laterality: N/A;  . HERNIA REPAIR    . INNER EAR SURGERY     pt not sure of type  . LEFT  HEART CATH AND CORONARY ANGIOGRAPHY N/A 10/26/2016   Procedure: Left Heart Cath and Coronary Angiography;  Surgeon: Teodoro Spray, MD;  Location: [facility];  Service: Cardiovascular;  Laterality: N/A;  . THYROIDECTOMY      Family History  Problem Relation Age of Onset  . Healthy Mother   . Cerebral aneurysm Father     Social History   Socioeconomic History  . Marital status: Widowed    Spouse name: Not on file  . Number of children: 1  . Years of education: GED  . Highest education level: 10th grade  Occupational History  . Occupation: Retired  Scientific laboratory technician  . Financial resource strain: Not hard at all  . Food insecurity:    Worry: Never true    Inability: Never true  . Transportation needs:    Medical: No    Non-medical: No  Tobacco Use  . Smoking status: Former Smoker    Packs/day: 0.50    Years: 20.00    Pack years: 10.00    Types: Cigarettes    Last attempt to quit: 2000    Years since quitting: 20.2  . Smokeless tobacco: Never Used  . Tobacco comment: smoking cessation materials not required  Substance and Sexual Activity  . Alcohol use: No    Alcohol/week: 0.0 standard drinks  . Drug use: No  . Sexual activity: Not Currently  Lifestyle  . Physical activity:    Days per week: 5 days    Minutes per session: 120 min  . Stress: Not at all  Relationships  . Social connections:    Talks on phone: More than three times a week    Gets together: Once a week    Attends religious service: More than 4 times per year    Active member of club or organization: Patient refused    Attends meetings of clubs or organizations: Patient refused    Relationship status: Widowed  . Intimate partner violence:    Fear of current or ex partner: No    Emotionally abused: No    Physically abused: No    Forced sexual activity: No  Other Topics Concern  . Not on file  Social History Narrative   Lives alone   Retired from working a mill      Current Outpatient  Medications:  .  acetaminophen (TYLENOL) 500 MG tablet, Take 500 mg by mouth 2 (two) times daily. , Disp: , Rfl:  .  aspirin 81 MG tablet, Take 81 mg by mouth daily., Disp: , Rfl:  .  cetirizine (ZYRTEC) 10 MG tablet, Take 1 tablet (10 mg total) by mouth daily., Disp: 30 tablet, Rfl: 0 .  Cholecalciferol (VITAMIN D-1000 MAX ST) 1000 units tablet, Take by mouth., Disp: , Rfl:  .  cloNIDine (CATAPRES) 0.2 MG tablet, Take 0.2 mg by mouth 2 (two) times daily., Disp: , Rfl:  .  hydrOXYzine (ATARAX/VISTARIL) 25 MG tablet, Take 1 tablet (25 mg total) by mouth every 6 (six) hours as needed for anxiety., Disp: 30 tablet, Rfl: 0 .  irbesartan (AVAPRO) 300 MG tablet,  Take 1 tablet (300 mg total) by mouth daily., Disp: 90 tablet, Rfl: 1 .  Iron-Vitamins (GERITOL COMPLETE) TABS, Take 1 tablet by mouth daily. , Disp: , Rfl:  .  labetalol (NORMODYNE) 100 MG tablet, Take 1 tablet by mouth 2 (two) times daily. , Disp: , Rfl:  .  levothyroxine (SYNTHROID, LEVOTHROID) 75 MCG tablet, Take 1 tablet (75 mcg total) by mouth daily before breakfast., Disp: 30 tablet, Rfl: 6 .  nitroGLYCERIN (NITROSTAT) 0.4 MG SL tablet, Place 1 tablet (0.4 mg total) under the tongue every 5 (five) minutes as needed for chest pain., Disp: 20 tablet, Rfl: 12 .  Omega-3 Fatty Acids (FISH OIL) 1000 MG CPDR, Take 2 capsules by mouth daily., Disp: , Rfl:  .  sodium chloride 1 g tablet, Take 1 tablet (1 g total) by mouth 2 (two) times daily with a meal., Disp: 30 tablet, Rfl: 0  Allergies  Allergen Reactions  . Ace Inhibitors Cough  . Ciprofloxacin Other (See Comments)    unknown  . Citalopram     hyponatremia  . Hydrocodone-Acetaminophen Other (See Comments)    unknown  . Norvasc [Amlodipine Besylate]     Pruritus   . Pantoprazole Sodium Other (See Comments)    Cramps  . Pravastatin Other (See Comments)    Stomach cramps  . Sulfa Antibiotics     unknown    I personally reviewed active problem list, medication list, allergies,  family history, social history, health maintenance with the patient/caregiver today.   ROS  Ten systems reviewed and is negative except as mentioned in HPI   Objective  Virtual encounter, vitals not obtained.  There is no height or weight on file to calculate BMI.  Physical Exam  Constitutional: Patient alert, strong voice on the phone Psychiatric: Patient has a normal mood and affect. behavior is normal. Judgment and thought content normal.  PHQ2/9: Depression screen [facility] 2/9 12/18/2018 09/12/2018 08/10/2018 06/16/2018 03/30/2018  Decreased Interest 0 0 0 0 0  Down, Depressed, Hopeless 0 0 0 0 0  PHQ - 2 Score 0 0 0 0 0  Altered sleeping 0 2 0 0 0  Tired, decreased energy 0 0 0 0 0  Change in appetite 0 0 0 0 0  Feeling bad or failure about yourself  0 0 0 0 0  Trouble concentrating 0 0 0 0 0  Moving slowly or fidgety/restless 0 0 0 0 0  Suicidal thoughts 0 0 0 0 0  PHQ-9 Score 0 2 0 0 0  Difficult doing work/chores - Somewhat difficult Not difficult at all - Not difficult at all   PHQ-2/9 Result is negative.    Fall Risk: Fall Risk  12/18/2018 09/12/2018 08/10/2018 08/10/2018 06/16/2018  Falls in the past year? 0 0 0 0 No  Number falls in past yr: 0 0 0 - -  Injury with Fall? 0 0 0 - -  Risk for fall due to : - - - - -  Risk for fall due to: Comment - - - - -     Assessment & Plan  1. Nasal congestion  She states secondary to bp medication, discussed nasal saline   2. PMR (polymyalgia rheumatica) (HCC)  Not seeing Dr. Jefm Bryant, but symptoms have been controlled   3. Major depression in remission ([place])  In remission   4. 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[facility])  Refuses statin therapy   5. Hyponatremia  Stable   6. CKD (chronic kidney disease), stage III ([place])  Reviewed last labs done by Dr. Abigail Butts  7. Hypothyroidism, postsurgical  Recheck labs next visit, last TSH was normal   8. Angina pectoris ([place])  No recent episodes   9. Dyslipidemia   Refuses medication   10. Panic attack  She still has hydroxyzine at home   I discussed the assessment and treatment plan with the patient. The patient was provided an opportunity to ask questions and all were answered. The patient agreed with the plan and demonstrated an understanding of the instructions.   The patient was advised to call back or seek an in-person evaluation if the symptoms worsen or if the condition fails to improve as anticipated.  I provided 25 minutes of non-face-to-face time during this encounter.  Loistine Chance, MD

## 2019-04-03 ENCOUNTER — Ambulatory Visit: Payer: Medicare Other

## 2019-04-13 ENCOUNTER — Telehealth: Payer: Self-pay

## 2019-04-13 DIAGNOSIS — E890 Postprocedural hypothyroidism: Secondary | ICD-10-CM

## 2019-04-13 LAB — TSH: TSH: 0.75 mIU/L (ref 0.40–4.50)

## 2019-04-13 NOTE — Telephone Encounter (Signed)
Copied from [place] [id number]. Topic: Appointment Scheduling - Scheduling Inquiry for Clinic >> Apr 13, 2019  9:39 AM Lennox Solders wrote: Reason for CRM: pt is calling and would like to have thyroid labs

## 2019-04-13 NOTE — Telephone Encounter (Signed)
TSH was ordered and left patient a voicemail informing her the labwork slip is up front and ready for her to have done.

## 2019-06-07 ENCOUNTER — Ambulatory Visit (INDEPENDENT_AMBULATORY_CARE_PROVIDER_SITE_OTHER): Payer: Medicare Other

## 2019-06-07 VITALS — Ht 63.0 in | Wt 186.0 lb

## 2019-06-07 DIAGNOSIS — Z0000 Encounter for general adult medical examination without abnormal findings: Secondary | ICD-10-CM | POA: Diagnosis not present

## 2019-06-07 NOTE — Progress Notes (Signed)
Subjective:   Betty Nielsen is a 73 y.o. female who presents for Medicare Annual (Subsequent) preventive examination.  Virtual Visit via Telephone Note  I connected with Julio Sicks on 06/07/19 at  3:30 PM EDT by telephone and verified that I am speaking with the correct person using two identifiers.  Medicare Annual Wellness visit completed telephonically due to Covid-19 pandemic.   Location: Patient: home Provider: office   I discussed the limitations, risks, security and privacy concerns of performing an evaluation and management service by telephone and the availability of in person appointments. The patient expressed understanding and agreed to proceed.  Some vital signs may be absent or patient reported.   Clemetine Marker, LPN    Review of Systems:   Cardiac Risk Factors include: advanced age (>16men, >84 women);hypertension;obesity (BMI >30kg/m2)     Objective:     Vitals: Ht 5\' 3"  (1.6 m)   Wt 186 lb (84.4 kg)   BMI 32.95 kg/m   Body mass index is 32.95 kg/m.  Advanced Directives 06/07/2019 08/29/2018 08/22/2018 08/21/2018 08/06/2018 03/30/2018 10/24/2017  Does Patient Have a Medical Advance Directive? Yes No No No;Yes Yes Yes No  Type of Paramedic of [place];Living will Living will;Healthcare Power of [place];Living will Living will;Healthcare Power of [place];Living will -  Does patient want to make changes to medical advance directive? - - - - No - Patient declined - -  Copy of [place] in Chart? No - copy requested No - copy requested - - No - copy requested No - copy requested -  Would patient like information on creating a medical advance directive? - No - Patient declined - - - No - Patient declined No - Patient declined    Tobacco Social History   Tobacco Use  Smoking Status Former Smoker  . Packs/day: 0.50  . Years: 20.00  . Pack years: 10.00   . Types: Cigarettes  . Quit date: 2000  . Years since quitting: 20.7  Smokeless Tobacco Never Used  Tobacco Comment   smoking cessation materials not required     Counseling given: Not Answered Comment: smoking cessation materials not required   Clinical Intake:  Pre-visit preparation completed: Yes  Pain : No/denies pain     BMI - recorded: 32.95 Nutritional Status: BMI > 30  Obese Nutritional Risks: None Diabetes: No  How often do you need to have someone help you when you read instructions, pamphlets, or other written materials from your doctor or pharmacy?: 1 - Never  Interpreter Needed?: No  Information entered by :: Clemetine Marker LPN  Past Medical History:  Diagnosis Date  . Arthrosis of knee   . Chronic kidney disease   . Hyperlipidemia   . Hypertension   . Loss of hearing   . Metabolic syndrome   . Plantar fasciitis   . Renal insufficiency    Past Surgical History:  Procedure Laterality Date  . ABDOMINAL AORTIC ANEURYSM REPAIR    . ABDOMINAL HYSTERECTOMY    . APPENDECTOMY    . CORONARY STENT INTERVENTION N/A 10/26/2016   Procedure: Coronary Stent Intervention;  Surgeon: Yolonda Kida, MD;  Location: [facility];  Service: Cardiovascular;  Laterality: N/A;  . HERNIA REPAIR    . INNER EAR SURGERY     pt not sure of type  . LEFT HEART CATH AND CORONARY ANGIOGRAPHY N/A 10/26/2016   Procedure: Left Heart Cath  and Coronary Angiography;  Surgeon: Teodoro Spray, MD;  Location: [facility];  Service: Cardiovascular;  Laterality: N/A;  . THYROIDECTOMY     Family History  Problem Relation Age of Onset  . Healthy Mother   . Cerebral aneurysm Father    Social History   Socioeconomic History  . Marital status: Widowed    Spouse name: Not on file  . Number of children: 1  . Years of education: GED  . Highest education level: 10th grade  Occupational History  . Occupation: Retired  Scientific laboratory technician  . Financial resource strain: Not hard at  all  . Food insecurity    Worry: Never true    Inability: Never true  . Transportation needs    Medical: No    Non-medical: No  Tobacco Use  . Smoking status: Former Smoker    Packs/day: 0.50    Years: 20.00    Pack years: 10.00    Types: Cigarettes    Quit date: 2000    Years since quitting: 20.7  . Smokeless tobacco: Never Used  . Tobacco comment: smoking cessation materials not required  Substance and Sexual Activity  . Alcohol use: No    Alcohol/week: 0.0 standard drinks  . Drug use: No  . Sexual activity: Not Currently  Lifestyle  . Physical activity    Days per week: 3 days    Minutes per session: 30 min  . Stress: Not at all  Relationships  . Social connections    Talks on phone: More than three times a week    Gets together: Once a week    Attends religious service: More than 4 times per year    Active member of club or organization: Patient refused    Attends meetings of clubs or organizations: Patient refused    Relationship status: Widowed  Other Topics Concern  . Not on file  Social History Narrative   Lives alone   Retired from working a [employer] Encounter Medications as of 06/07/2019  Medication Sig  . acetaminophen (TYLENOL) 500 MG tablet Take 500 mg by mouth 2 (two) times daily.   Marland Kitchen aspirin 81 MG tablet Take 81 mg by mouth daily.  . cetirizine (ZYRTEC) 10 MG tablet Take 1 tablet (10 mg total) by mouth daily. (Patient taking differently: Take 10 mg by mouth daily. PRN only)  . Cholecalciferol (VITAMIN D-1000 MAX ST) 1000 units tablet Take by mouth.  . cloNIDine (CATAPRES) 0.2 MG tablet Take 0.2 mg by mouth 2 (two) times daily.  . irbesartan (AVAPRO) 300 MG tablet Take 1 tablet (300 mg total) by mouth daily.  Marland Kitchen labetalol (NORMODYNE) 100 MG tablet Take 1 tablet by mouth 2 (two) times daily.   Marland Kitchen levothyroxine (SYNTHROID, LEVOTHROID) 75 MCG tablet Take 1 tablet (75 mcg total) by mouth daily before breakfast.  . Omega-3 Fatty Acids (FISH OIL)  1000 MG CPDR Take 2 capsules by mouth daily.  . nitroGLYCERIN (NITROSTAT) 0.4 MG SL tablet Place 1 tablet (0.4 mg total) under the tongue every 5 (five) minutes as needed for chest pain. (Patient not taking: Reported on 06/07/2019)  . [DISCONTINUED] hydrOXYzine (ATARAX/VISTARIL) 25 MG tablet Take 1 tablet (25 mg total) by mouth every 6 (six) hours as needed for anxiety.  . [DISCONTINUED] Iron-Vitamins (GERITOL COMPLETE) TABS Take 1 tablet by mouth daily.   . [DISCONTINUED] sodium chloride 1 g tablet Take 1 tablet (1 g total) by mouth 2 (two) times daily with a  meal.   No facility-administered encounter medications on file as of 06/07/2019.     Activities of Daily Living In your present state of health, do you have any difficulty performing the following activities: 06/07/2019 12/18/2018  Hearing? N Y  Comment declines hearing aids -  Vision? N Y  Difficulty concentrating or making decisions? N N  Walking or climbing stairs? N N  Dressing or bathing? N N  Doing errands, shopping? N N  Preparing Food and eating ? N -  Using the Toilet? N -  In the past six months, have you accidently leaked urine? N -  Do you have problems with loss of bowel control? N -  Managing your Medications? N -  Managing your Finances? N -  Housekeeping or managing your Housekeeping? N -  Some recent data might be hidden    Patient Care Team: Steele Sizer, MD as PCP - General (Family Medicine) Lavonia Dana, MD as Consulting Physician (Internal Medicine) Yolonda Kida, MD as Consulting Physician (Cardiology) Emmaline Kluver., MD as Consulting Physician (Rheumatology) Angelia Mould, MD as Consulting Physician (Vascular Surgery) Carloyn Manner, MD as Referring Physician (Otolaryngology)    Assessment:   This is a routine wellness examination for [place].  Exercise Activities and Dietary recommendations Current Exercise Habits: Home exercise routine, Type of exercise:  calisthenics;stretching, Time (Minutes): 30, Frequency (Times/Week): 3, Weekly Exercise (Minutes/Week): 90, Intensity: Mild, Exercise limited by: orthopedic condition(s)(arthritis)  Goals    . DIET - INCREASE WATER INTAKE     Recommend to drink at least 6-8 8oz glasses of water per day.    . Reduce sodium intake     Recommend to continue decreasing sodium intake in daily diet.       Fall Risk Fall Risk  06/07/2019 12/18/2018 09/12/2018 08/10/2018 08/10/2018  Falls in the past year? 0 0 0 0 0  Number falls in past yr: 0 0 0 0 -  Injury with Fall? 0 0 0 0 -  Risk for fall due to : - - - - -  Risk for fall due to: Comment - - - - -  Follow up Falls prevention discussed - - - -   FALL RISK PREVENTION PERTAINING TO THE HOME:  Any stairs in or around the home? Yes  If so, do they handrails? Yes   Home free of loose throw rugs in walkways, pet beds, electrical cords, etc? Yes  Adequate lighting in your home to reduce risk of falls? Yes   ASSISTIVE DEVICES UTILIZED TO PREVENT FALLS:  Life alert? No  Use of a cane, walker or w/c? No  Grab bars in the bathroom? Yes  Shower chair or bench in shower? No  Elevated toilet seat or a handicapped toilet? No   DME ORDERS:  DME order needed?  No   TIMED UP AND GO:  Was the test performed? No . Telephonic visit.   Education: Fall risk prevention has been discussed.  Intervention(s) required? No    Depression Screen PHQ 2/9 Scores 06/07/2019 12/18/2018 09/12/2018 08/10/2018  PHQ - 2 Score 0 0 0 0  PHQ- 9 Score - 0 2 0  Exception Documentation - - - -     Cognitive Function     6CIT Screen 06/07/2019 03/30/2018  What Year? 0 points 0 points  What month? 0 points 0 points  What time? 0 points 0 points  Count back from 20 0 points 0 points  Months in reverse 0 points 0 points  Repeat phrase 0 points 2 points  Total Score 0 2    Immunization History  Administered Date(s) Administered  . Influenza, High Dose Seasonal PF  06/05/2015, 05/12/2017  . Influenza-Unspecified 06/29/2016, 05/12/2017, 06/16/2018  . Pneumococcal Conjugate-13 07/17/2014  . Pneumococcal Polysaccharide-23 11/24/2015    Qualifies for Shingles Vaccine? Yes  . Due for Shingrix. Education has been provided regarding the importance of this vaccine. Pt has been advised to call insurance company to determine out of pocket expense. Advised may also receive vaccine at local pharmacy or Health Dept. Verbalized acceptance and understanding.  Tdap: Although this vaccine is not a covered service during a Wellness Exam, does the patient still wish to receive this vaccine today?  No .  Education has been provided regarding the importance of this vaccine. Advised may receive this vaccine at local pharmacy or Health Dept. Aware to provide a copy of the vaccination record if obtained from local pharmacy or Health Dept. Verbalized acceptance and understanding.  Flu Vaccine: Due for Flu vaccine. Does the patient want to receive this vaccine today?  No . Education has been provided regarding the importance of this vaccine but still declined. Advised may receive this vaccine at local pharmacy or Health Dept. Aware to provide a copy of the vaccination record if obtained from local pharmacy or Health Dept. Verbalized acceptance and understanding.  Pneumococcal Vaccine: Up to date   Screening Tests Health Maintenance  Topic Date Due  . MAMMOGRAM  05/28/1996  . DEXA SCAN  05/29/2011  . INFLUENZA VACCINE  04/21/2019  . COLONOSCOPY  06/27/2019 (Originally 05/28/1996)  . Hepatitis C Screening  Completed  . PNA vac Low Risk Adult  Completed  . TETANUS/TDAP  Discontinued    Cancer Screenings:  Colorectal Screening: Completed per patient many years, declines repeat screening at this time.   Mammogram: Pt declines  Bone Density: pt declines  Lung Cancer Screening: (Low Dose CT Chest recommended if Age 7-80 years, 30 pack-year currently smoking OR have quit w/in  15years.) does not qualify.    Additional Screening:  Hepatitis C Screening: does qualify; Completed 09/03/12  Vision Screening: Recommended annual ophthalmology exams for early detection of glaucoma and other disorders of the eye. Is the patient up to date with their annual eye exam?  No  Who is the provider or what is the name of the office in which the pt attends annual eye exams? Dr. Gloriann Loan  Dental Screening: Recommended annual dental exams for proper oral hygiene  Community Resource Referral:  CRR required this visit?  No      Plan:   I have personally reviewed and addressed the Medicare Annual Wellness questionnaire and have noted the following in the patient's chart:  A. Medical and social history B. Use of alcohol, tobacco or illicit drugs  C. Current medications and supplements D. Functional ability and status E.  Nutritional status F.  Physical activity G. Advance directives H. List of other physicians I.  Hospitalizations, surgeries, and ER visits in previous 12 months J.  [place] such as hearing and vision if needed, cognitive and depression L. Referrals and appointments   In addition, I have reviewed and discussed with patient certain preventive protocols, quality metrics, and best practice recommendations. A written personalized care plan for preventive services as well as general preventive health recommendations were provided to patient.   Signed,  Clemetine Marker, LPN Nurse Health Advisor   Nurse Notes: none

## 2019-06-07 NOTE — Patient Instructions (Signed)
Betty Nielsen , Thank you for taking time to come for your Medicare Wellness Visit. I appreciate your ongoing commitment to your health goals. Please review the following plan we discussed and let me know if I can assist you in the future.   Screening recommendations/referrals: Colonoscopy: postponed Mammogram: postponed Bone Density: postponed Recommended yearly ophthalmology/optometry visit for glaucoma screening and checkup Recommended yearly dental visit for hygiene and checkup  Vaccinations: Influenza vaccine: postponed Pneumococcal vaccine: done 11/24/15 Tdap vaccine: due - please contact us if you get a cut or scrape Shingles vaccine: Shingrix discussed. Please contact your pharmacy for coverage information.   Advanced directives: Please bring a copy of your health care power of attorney and living will to the office at your convenience.  Conditions/risks identified: Keep up the great work!  Next appointment: Please follow up in one year for your Medicare Annual Wellness visit.     Preventive Care 73 Years and Older, Female Preventive care refers to lifestyle choices and visits with your health care provider that can promote health and wellness. What does preventive care include?  A yearly physical exam. This is also called an annual well check.  Dental exams once or twice a year.  Routine eye exams. Ask your health care provider how often you should have your eyes checked.  Personal lifestyle choices, including:  Daily care of your teeth and gums.  Regular physical activity.  Eating a healthy diet.  Avoiding tobacco and drug use.  Limiting alcohol use.  Practicing safe sex.  Taking low-dose aspirin every day.  Taking vitamin and mineral supplements as recommended by your health care provider. What happens during an annual well check? The services and screenings done by your health care provider during your annual well check will depend on your age, overall  health, lifestyle risk factors, and family history of disease. Counseling  Your health care provider may ask you questions about your:  Alcohol use.  Tobacco use.  Drug use.  Emotional well-being.  Home and relationship well-being.  Sexual activity.  Eating habits.  History of falls.  Memory and ability to understand (cognition).  Work and work Statistician.  Reproductive health. Screening  You may have the following tests or measurements:  Height, weight, and BMI.  Blood pressure.  Lipid and cholesterol levels. These may be checked every 5 years, or more frequently if you are over 29 years old.  Skin check.  Lung cancer screening. You may have this screening every year starting at age 19 if you have a 30-pack-year history of smoking and currently smoke or have quit within the past 15 years.  Fecal occult blood test (FOBT) of the stool. You may have this test every year starting at age 59.  Flexible sigmoidoscopy or colonoscopy. You may have a sigmoidoscopy every 5 years or a colonoscopy every 10 years starting at age 43.  Hepatitis C blood test.  Hepatitis B blood test.  Sexually transmitted disease (STD) testing.  Diabetes screening. This is done by checking your blood sugar (glucose) after you have not eaten for a while (fasting). You may have this done every 1-3 years.  Bone density scan. This is done to screen for osteoporosis. You may have this done starting at age 49.  Mammogram. This may be done every 1-2 years. Talk to your health care provider about how often you should have regular mammograms. Talk with your health care provider about your test results, treatment options, and if necessary, the need for more tests.  Vaccines  Your health care provider may recommend certain vaccines, such as:  Influenza vaccine. This is recommended every year.  Tetanus, diphtheria, and acellular pertussis (Tdap, Td) vaccine. You may need a Td booster every 10 years.   Zoster vaccine. You may need this after age 60.  Pneumococcal 13-valent conjugate (PCV13) vaccine. One dose is recommended after age 83.  Pneumococcal polysaccharide (PPSV23) vaccine. One dose is recommended after age 37. Talk to your health care provider about which screenings and vaccines you need and how often you need them. This information is not intended to replace advice given to you by your health care provider. Make sure you discuss any questions you have with your health care provider. Document Released: 10/03/2015 Document Revised: 05/26/2016 Document Reviewed: 07/08/2015 Elsevier Interactive Patient Education  2017 [employer] Prevention in the Home Falls can cause injuries. They can happen to people of all ages. There are many things you can do to make your home safe and to help prevent falls. What can I do on the outside of my home?  Regularly fix the edges of walkways and driveways and fix any cracks.  Remove anything that might make you trip as you walk through a door, such as a raised step or threshold.  Trim any bushes or trees on the path to your home.  Use bright outdoor lighting.  Clear any walking paths of anything that might make someone trip, such as rocks or tools.  Regularly check to see if handrails are loose or broken. Make sure that both sides of any steps have handrails.  Any raised decks and porches should have guardrails on the edges.  Have any leaves, snow, or ice cleared regularly.  Use sand or salt on walking paths during winter.  Clean up any spills in your garage right away. This includes oil or grease spills. What can I do in the bathroom?  Use night lights.  Install grab bars by the toilet and in the tub and shower. Do not use towel bars as grab bars.  Use non-skid mats or decals in the tub or shower.  If you need to sit down in the shower, use a plastic, non-slip stool.  Keep the floor dry. Clean up any water that spills  on the floor as soon as it happens.  Remove soap buildup in the tub or shower regularly.  Attach bath mats securely with double-sided non-slip rug tape.  Do not have throw rugs and other things on the floor that can make you trip. What can I do in the bedroom?  Use night lights.  Make sure that you have a light by your bed that is easy to reach.  Do not use any sheets or blankets that are too big for your bed. They should not hang down onto the floor.  Have a firm chair that has side arms. You can use this for support while you get dressed.  Do not have throw rugs and other things on the floor that can make you trip. What can I do in the kitchen?  Clean up any spills right away.  Avoid walking on wet floors.  Keep items that you use a lot in easy-to-reach places.  If you need to reach something above you, use a strong step stool that has a grab bar.  Keep electrical cords out of the way.  Do not use floor polish or wax that makes floors slippery. If you must use wax, use non-skid floor wax.  Do not have throw rugs and other things on the floor that can make you trip. What can I do with my stairs?  Do not leave any items on the stairs.  Make sure that there are handrails on both sides of the stairs and use them. Fix handrails that are broken or loose. Make sure that handrails are as long as the stairways.  Check any carpeting to make sure that it is firmly attached to the stairs. Fix any carpet that is loose or worn.  Avoid having throw rugs at the top or bottom of the stairs. If you do have throw rugs, attach them to the floor with carpet tape.  Make sure that you have a light switch at the top of the stairs and the bottom of the stairs. If you do not have them, ask someone to add them for you. What else can I do to help prevent falls?  Wear shoes that:  Do not have high heels.  Have rubber bottoms.  Are comfortable and fit you well.  Are closed at the toe. Do  not wear sandals.  If you use a stepladder:  Make sure that it is fully opened. Do not climb a closed stepladder.  Make sure that both sides of the stepladder are locked into place.  Ask someone to hold it for you, if possible.  Clearly mark and make sure that you can see:  Any grab bars or handrails.  First and last steps.  Where the edge of each step is.  Use tools that help you move around (mobility aids) if they are needed. These include:  Canes.  Walkers.  Scooters.  Crutches.  Turn on the lights when you go into a dark area. Replace any light bulbs as soon as they burn out.  Set up your furniture so you have a clear path. Avoid moving your furniture around.  If any of your floors are uneven, fix them.  If there are any pets around you, be aware of where they are.  Review your medicines with your doctor. Some medicines can make you feel dizzy. This can increase your chance of falling. Ask your doctor what other things that you can do to help prevent falls. This information is not intended to replace advice given to you by your health care provider. Make sure you discuss any questions you have with your health care provider. Document Released: 07/03/2009 Document Revised: 02/12/2016 Document Reviewed: 10/11/2014 Elsevier Interactive Patient Education  2017 [employer].

## 2019-06-20 ENCOUNTER — Ambulatory Visit (INDEPENDENT_AMBULATORY_CARE_PROVIDER_SITE_OTHER): Payer: Medicare Other | Admitting: Family Medicine

## 2019-06-20 ENCOUNTER — Encounter: Payer: Self-pay | Admitting: Family Medicine

## 2019-06-20 ENCOUNTER — Other Ambulatory Visit: Payer: Self-pay

## 2019-06-20 DIAGNOSIS — N183 Chronic kidney disease, stage 3 unspecified: Secondary | ICD-10-CM

## 2019-06-20 DIAGNOSIS — F325 Major depressive disorder, single episode, in full remission: Secondary | ICD-10-CM

## 2019-06-20 DIAGNOSIS — I209 Angina pectoris, unspecified: Secondary | ICD-10-CM

## 2019-06-20 DIAGNOSIS — E890 Postprocedural hypothyroidism: Secondary | ICD-10-CM

## 2019-06-20 DIAGNOSIS — M353 Polymyalgia rheumatica: Secondary | ICD-10-CM | POA: Diagnosis not present

## 2019-06-20 DIAGNOSIS — E785 Hyperlipidemia, unspecified: Secondary | ICD-10-CM

## 2019-06-20 DIAGNOSIS — I700 Atherosclerosis of aorta: Secondary | ICD-10-CM | POA: Diagnosis not present

## 2019-06-20 DIAGNOSIS — E871 Hypo-osmolality and hyponatremia: Secondary | ICD-10-CM

## 2019-06-20 DIAGNOSIS — I10 Essential (primary) hypertension: Secondary | ICD-10-CM

## 2019-06-20 MED ORDER — LEVOTHYROXINE SODIUM 75 MCG PO TABS: 75.0000 ug | ORAL_TABLET | Freq: Every day | ORAL | 5 refills | Status: DC

## 2019-06-20 NOTE — Progress Notes (Signed)
Name: JARIANNA HULTS   MRN: [id number]    DOB: [birth year]   Date:06/20/2019       Progress Note  Subjective  Chief Complaint  Chief Complaint  Patient presents with  . Medication Refill    6 month F/U  . Hypertension    Denies any symptoms  . Anxiety    I connected with  Julio Sicks on 06/20/19 at  1:20 PM EDT by telephone and verified that I am speaking with the correct person using two identifiers.  I discussed the limitations, risks, security and privacy concerns of performing an evaluation and management service by telephone and the availability of in person appointments. Staff also discussed with the patient that there may be a patient responsible charge related to this service. Patient Location: at home  Provider Location: [place]   HPI   [facility]/ Hyponatremia:stable kidney function when looked at labs done at [facility] 10/2018 by Dr. Abigail Butts and GFR was 40, sodium stable at 133 . Good urine output, she denies pruritus, no nausea or vomiting.She has not had labs in over 6 months and advised her to stop by for labs but she would like to wait until next visit   HTN/CAD/Angina: bp is being managed by Dr. Abigail Butts. At home her bp has been 140's/70's She has not used any NTG since Feb 2018 ( when she had MI).She is off plavix, still taking aspirin daily, no easy bleeding or bruising.  PMR: use to see  Dr. Jefm Bryant, tender joints and muscles all over,worse on hips and sometimes shoulders. She is still  taking tylenol prn pain. Pain  5/10  Atherosclerosis abdominal aorta/hyperlipidemia: unable to tolerate statin,currently on 81 mg aspirin she denies easy bleeding.She also has a history of aneurysmal of aorta repair done by Dr. Doren Custard ( vascular surgeon in [place]). She will schedule a follow up.   Hyperlipidemia: LDL dangerously high, back in September 2019  LDL was 226 , but patient is not willing to try statin, too scared of side effects, she has a  history of MI and still not willing.She also has atherosclerosis of abdominal aorta, discussed risk of strokes and heart attacks, explained we have other agents besides statins, ( including Zetia or PSK9  )to improve LDL, but she refuses that also . Unchanged   Hypothyroidism: she is s/p thyroidectomy was having labs done by Dr. Pryor Ochoa, Compliant with medication, denies dry skin, states constipation under control with bran flakes for breakfast,  denies hair loss . Last TSHwas at goal, she is taking Synthroid 75 mcg daily, last TSH was done in 03/2019  and it was at normal range . 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 She denies sadness, crying spells, change in appetite, or suicidal thoughts.Phq 9 today was zero . She states she is coping okay even through the pandemic   Patient Active Problem List   Diagnosis Date Noted  . PMR (polymyalgia rheumatica) ([place]) 06/15/2017  . Elevated troponin 10/27/2016  . Essential hypertension, malignant 10/27/2016  . Headache 10/27/2016  . CKD (chronic kidney disease), stage III ([place]) 10/27/2016  . Hyperlipidemia 10/27/2016  . Hyponatremia 10/27/2016  . Angina pectoris ([place]) 10/25/2016  . History of non-ST elevation myocardial infarction (NSTEMI) 10/25/2016  . History of coronary angioplasty 10/25/2016  . BPV (benign positional vertigo) 06/15/2016  . 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[place]) 11/21/2015  .  Anemia of chronic disease 06/05/2015  . Acid reflux 06/05/2015  . Hypertension, benign 06/05/2015  . Hypothyroidism, 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  . Major depression in remission ([place]) 06/05/2015  . Obesity (BMI 30.0-34.9) 06/05/2015  . Neuropathy 06/05/2015  . Arthritis, degenerative 07/03/2014  . AAA (abdominal aortic aneurysm) without rupture ([place]) 06/26/2014    Past Surgical History:  Procedure Laterality Date  .  ABDOMINAL AORTIC ANEURYSM REPAIR    . ABDOMINAL HYSTERECTOMY    . APPENDECTOMY    . CORONARY STENT INTERVENTION N/A 10/26/2016   Procedure: Coronary Stent Intervention;  Surgeon: Yolonda Kida, MD;  Location: [facility];  Service: Cardiovascular;  Laterality: N/A;  . HERNIA REPAIR    . INNER EAR SURGERY     pt not sure of type  . LEFT HEART CATH AND CORONARY ANGIOGRAPHY N/A 10/26/2016   Procedure: Left Heart Cath and Coronary Angiography;  Surgeon: Teodoro Spray, MD;  Location: [facility];  Service: Cardiovascular;  Laterality: N/A;  . THYROIDECTOMY      Family History  Problem Relation Age of Onset  . Healthy Mother   . Cerebral aneurysm Father     Social History   Socioeconomic History  . Marital status: Widowed    Spouse name: Not on file  . Number of children: 1  . Years of education: GED  . Highest education level: 10th grade  Occupational History  . Occupation: Retired  Scientific laboratory technician  . Financial resource strain: Not hard at all  . Food insecurity    Worry: Never true    Inability: Never true  . Transportation needs    Medical: No    Non-medical: No  Tobacco Use  . Smoking status: Former Smoker    Packs/day: 0.50    Years: 20.00    Pack years: 10.00    Types: Cigarettes    Quit date: 2000    Years since quitting: 20.7  . Smokeless tobacco: Never Used  . Tobacco comment: smoking cessation materials not required  Substance and Sexual Activity  . Alcohol use: No    Alcohol/week: 0.0 standard drinks  . Drug use: No  . Sexual activity: Not Currently  Lifestyle  . Physical activity    Days per week: 3 days    Minutes per session: 30 min  . Stress: Not at all  Relationships  . Social connections    Talks on phone: More than three times a week    Gets together: Once a week    Attends religious service: More than 4 times per year    Active member of club or organization: Patient refused    Attends meetings of clubs or organizations:  Patient refused    Relationship status: Widowed  . Intimate partner violence    Fear of current or ex partner: No    Emotionally abused: No    Physically abused: No    Forced sexual activity: No  Other Topics Concern  . Not on file  Social History Narrative   Lives alone   Retired from working a mill      Current Outpatient Medications:  .  acetaminophen (TYLENOL) 500 MG tablet, Take 500 mg by mouth every 8 (eight) hours as needed (body aches). , Disp: , Rfl:  .  aspirin 81 MG tablet, Take 81 mg by mouth daily., Disp: , Rfl:  .  cetirizine (ZYRTEC) 10 MG tablet, Take 1 tablet (10 mg total) by mouth daily. (Patient taking differently: Take 10 mg by  mouth daily. PRN only), Disp: 30 tablet, Rfl: 0 .  Cholecalciferol (VITAMIN D-1000 MAX ST) 1000 units tablet, Take by mouth., Disp: , Rfl:  .  cloNIDine (CATAPRES) 0.2 MG tablet, Take 0.2 mg by mouth 2 (two) times daily., Disp: , Rfl:  .  irbesartan (AVAPRO) 300 MG tablet, Take 1 tablet (300 mg total) by mouth daily., Disp: 90 tablet, Rfl: 1 .  labetalol (NORMODYNE) 100 MG tablet, Take 1 tablet by mouth 2 (two) times daily. , Disp: , Rfl:  .  levothyroxine (SYNTHROID, LEVOTHROID) 75 MCG tablet, Take 1 tablet (75 mcg total) by mouth daily before breakfast., Disp: 30 tablet, Rfl: 6 .  nitroGLYCERIN (NITROSTAT) 0.4 MG SL tablet, Place 1 tablet (0.4 mg total) under the tongue every 5 (five) minutes as needed for chest pain., Disp: 20 tablet, Rfl: 12 .  Omega-3 Fatty Acids (FISH OIL) 1000 MG CPDR, Take 2 capsules by mouth daily., Disp: , Rfl:   Allergies  Allergen Reactions  . Ace Inhibitors Cough  . Ciprofloxacin Other (See Comments)    unknown  . Citalopram     hyponatremia  . Hydrocodone-Acetaminophen Other (See Comments)    unknown  . Norvasc [Amlodipine Besylate]     Pruritus   . Pantoprazole Sodium Other (See Comments)    Cramps  . Pravastatin Other (See Comments)    Stomach cramps  . Sulfa Antibiotics     unknown    I  personally reviewed active problem list, medication list, allergies, family history, social history, health maintenance with the patient/caregiver today.   ROS  Ten systems reviewed and is negative except as mentioned in HPI   Objective  Virtual encounter, vitals not obtained.  There is no height or weight on file to calculate BMI.  Physical Exam  Awake, alert and oriented  PHQ2/9: Depression screen [facility] 2/9 06/20/2019 06/07/2019 12/18/2018 09/12/2018 08/10/2018  Decreased Interest 0 0 0 0 0  Down, Depressed, Hopeless 0 0 0 0 0  PHQ - 2 Score 0 0 0 0 0  Altered sleeping 0 - 0 2 0  Tired, decreased energy 0 - 0 0 0  Change in appetite 0 - 0 0 0  Feeling bad or failure about yourself  0 - 0 0 0  Trouble concentrating 0 - 0 0 0  Moving slowly or fidgety/restless 0 - 0 0 0  Suicidal thoughts 0 - 0 0 0  PHQ-9 Score 0 - 0 2 0  Difficult doing work/chores Not difficult at all - - Somewhat difficult Not difficult at all  Some recent data might be hidden   PHQ-2/9 Result is negative.    Fall Risk: Fall Risk  06/20/2019 06/07/2019 12/18/2018 09/12/2018 08/10/2018  Falls in the past year? 0 0 0 0 0  Number falls in past yr: 0 0 0 0 0  Injury with Fall? 0 0 0 0 0  Risk for fall due to : - - - - -  Risk for fall due to: Comment - - - - -  Follow up - Falls prevention discussed - - -     Assessment & Plan  1. PMR (polymyalgia rheumatica) (HCC)  Only on Tylenol seen by Dr. Jefm Bryant   2. Hypothyroidism, postsurgical  - levothyroxine (SYNTHROID) 75 MCG tablet; Take 1 tablet (75 mcg total) by mouth daily before breakfast.  Dispense: 30 tablet; Refill: 5  3. 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[facility])  Not on statin, refuses zetia , needs to recheck labs but afraid to come  in   4. Major depression in remission (H[facility])  Unchanged   5. Chronic kidney disease (CKD), stage III (moderate)  Under the care of Dr. Abigail Butts   6. Hyponatremia   7. Angina pectoris ([place])  No recent  episodes   8. Hypertension, benign  Unable to check at home, out of batteries  9. Dyslipidemia  I discussed the assessment and treatment plan with the patient. The patient was provided an opportunity to ask questions and all were answered. The patient agreed with the plan and demonstrated an understanding of the instructions.   The patient was advised to call back or seek an in-person evaluation if the symptoms worsen or if the condition fails to improve as anticipated.  I provided 25  minutes of non-face-to-face time during this encounter.  Loistine Chance, MD

## 2019-10-11 ENCOUNTER — Other Ambulatory Visit: Payer: Self-pay

## 2019-10-11 ENCOUNTER — Emergency Department: Payer: Medicare Other

## 2019-10-11 ENCOUNTER — Emergency Department
Admission: EM | Admit: 2019-10-11 | Discharge: 2019-10-11 | Disposition: A | Discharge status: Home / Self Care | Payer: Medicare Other | Attending: Emergency Medicine | Admitting: Emergency Medicine

## 2019-10-11 ENCOUNTER — Encounter: Payer: Self-pay | Admitting: Emergency Medicine

## 2019-10-11 DIAGNOSIS — K2900 Acute gastritis without bleeding: Secondary | ICD-10-CM | POA: Diagnosis not present

## 2019-10-11 DIAGNOSIS — R0789 Other chest pain: Secondary | ICD-10-CM | POA: Insufficient documentation

## 2019-10-11 DIAGNOSIS — Z87891 Personal history of nicotine dependence: Secondary | ICD-10-CM | POA: Diagnosis not present

## 2019-10-11 DIAGNOSIS — E039 Hypothyroidism, unspecified: Secondary | ICD-10-CM | POA: Diagnosis not present

## 2019-10-11 DIAGNOSIS — N183 Chronic kidney disease, stage 3 unspecified: Secondary | ICD-10-CM | POA: Insufficient documentation

## 2019-10-11 DIAGNOSIS — Z79899 Other long term (current) drug therapy: Secondary | ICD-10-CM | POA: Insufficient documentation

## 2019-10-11 DIAGNOSIS — Z7982 Long term (current) use of aspirin: Secondary | ICD-10-CM | POA: Insufficient documentation

## 2019-10-11 DIAGNOSIS — I129 Hypertensive chronic kidney disease with stage 1 through stage 4 chronic kidney disease, or unspecified chronic kidney disease: Secondary | ICD-10-CM | POA: Insufficient documentation

## 2019-10-11 DIAGNOSIS — R079 Chest pain, unspecified: Secondary | ICD-10-CM

## 2019-10-11 LAB — BASIC METABOLIC PANEL
Anion gap: 9 (ref 5–15)
BUN: 18 mg/dL (ref 8–23)
CO2: 23 mmol/L (ref 22–32)
Calcium: 9 mg/dL (ref 8.9–10.3)
Chloride: 103 mmol/L (ref 98–111)
Creatinine, Ser: 1.73 mg/dL — ABNORMAL HIGH (ref 0.44–1.00)
GFR calc Af Amer: 33 mL/min — ABNORMAL LOW (ref >60)
GFR calc non Af Amer: 29 mL/min — ABNORMAL LOW (ref >60)
Glucose, Bld: 120 mg/dL — ABNORMAL HIGH (ref 70–99)
Potassium: 4.2 mmol/L (ref 3.5–5.1)
Sodium: 135 mmol/L (ref 135–145)

## 2019-10-11 LAB — CBC
HCT: 33.5 % — ABNORMAL LOW (ref 36.0–46.0)
Hemoglobin: 10.7 g/dL — ABNORMAL LOW (ref 12.0–15.0)
MCH: 29.7 pg (ref 26.0–34.0)
MCHC: 31.9 g/dL (ref 30.0–36.0)
MCV: 93.1 fL (ref 80.0–100.0)
Platelets: 229 10*3/uL (ref 150–400)
RBC: 3.6 MIL/uL — ABNORMAL LOW (ref 3.87–5.11)
RDW: 12 % (ref 11.5–15.5)
WBC: 5.9 10*3/uL (ref 4.0–10.5)
nRBC: 0 % (ref 0.0–0.2)

## 2019-10-11 LAB — TROPONIN I (HIGH SENSITIVITY): Troponin I (High Sensitivity): 5 ng/L (ref <18)

## 2019-10-11 IMAGING — CR DG CHEST 2V
1 series · 2 of 2 positions shown · non-contrast
Comparison: [DATE]

CLINICAL DATA: Chest pain

EXAM:
CHEST - 2 VIEW

[Series 1: dg chest 2 view · 0.14mm/px · 2 of 2 slices shown]
[im 1/2]
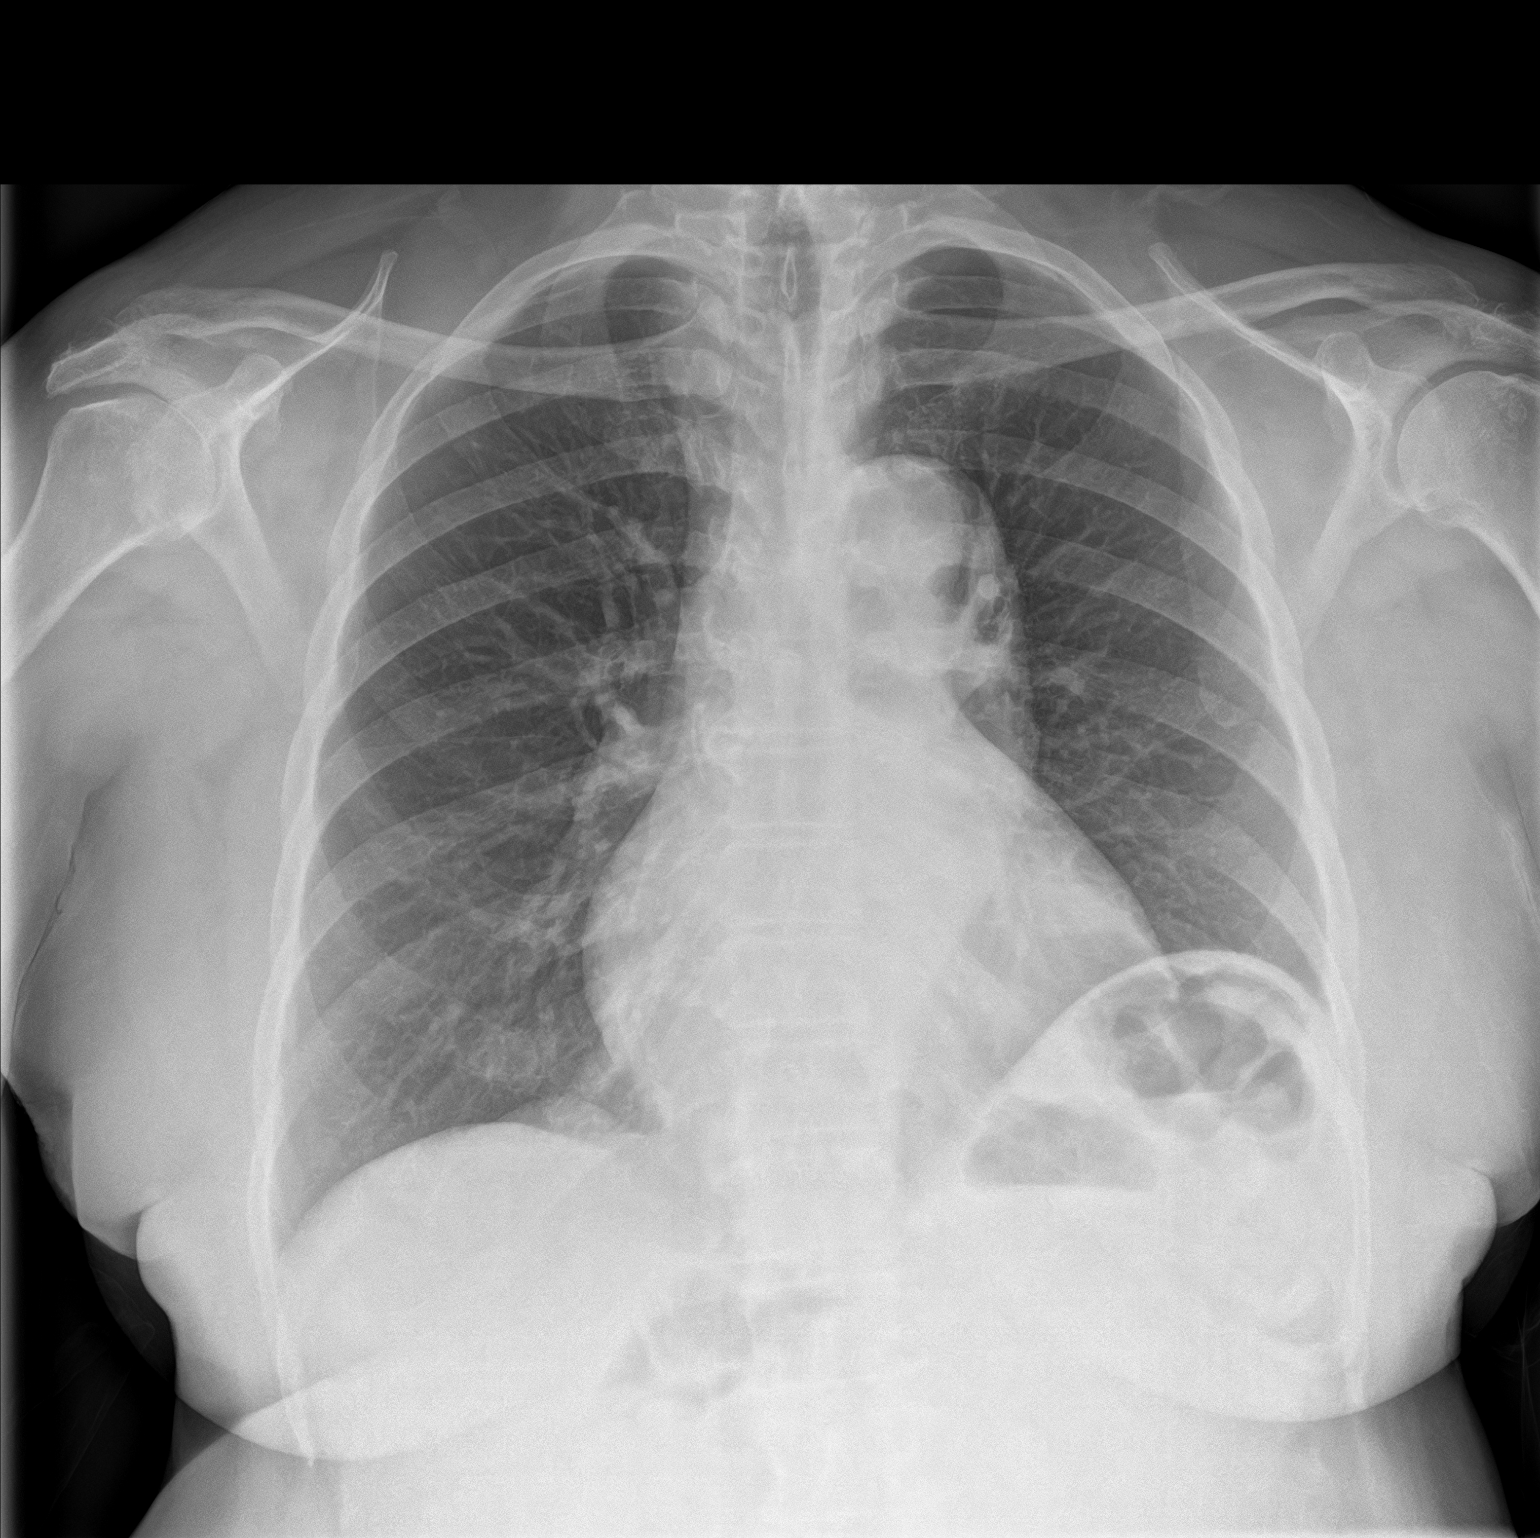
[im 2/2]
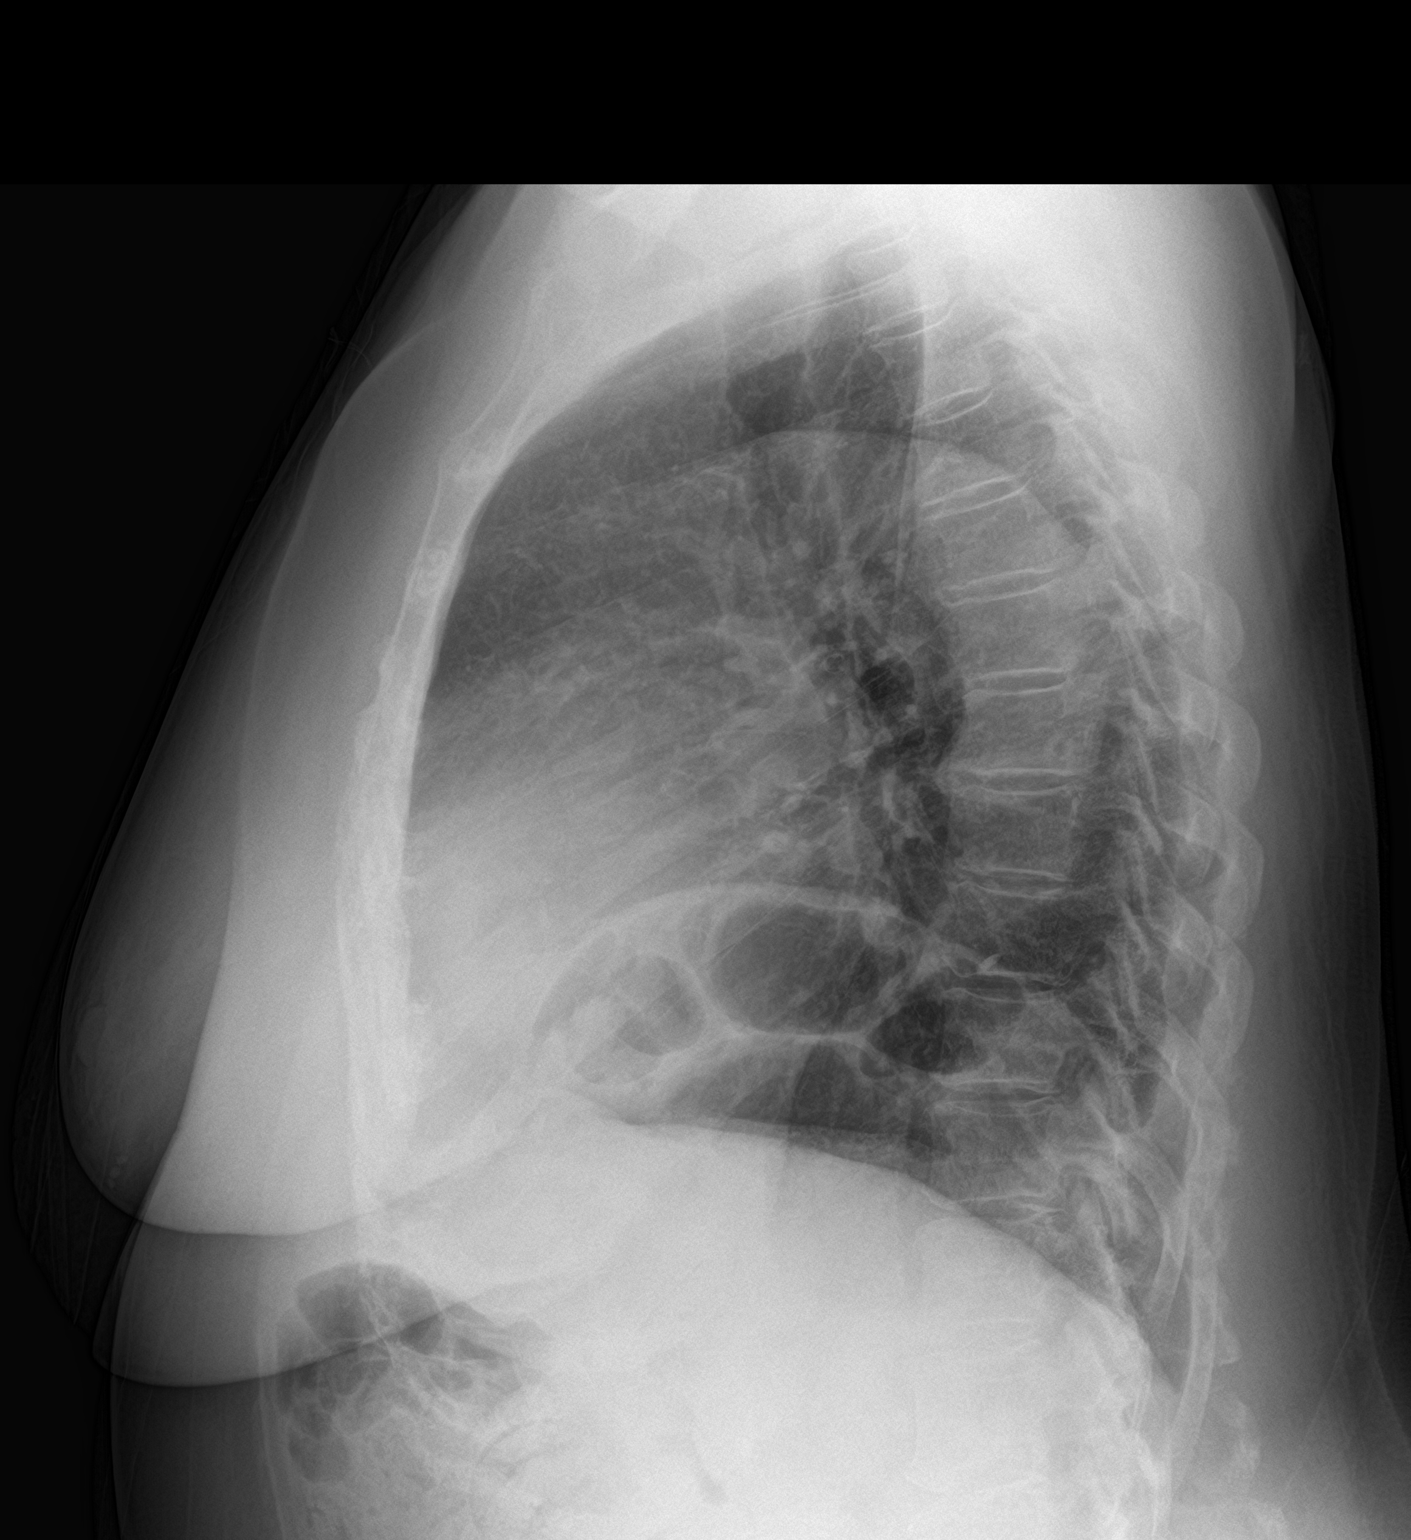

[2 of 2 positions shown; findings below may reference images not displayed]

FINDINGS: The lungs are clear without focal pneumonia, edema, pneumothorax or
pleural effusion. Cardiopericardial silhouette is at upper limits of
normal for size. The visualized bony structures of the thorax are
intact.
IMPRESSION: Stable.  No acute findings.

## 2019-10-11 MED ORDER — SUCRALFATE 1 G PO TABS: 1.0000 g | ORAL_TABLET | Freq: Three times a day (TID) | ORAL | 1 refills | Status: DC

## 2019-10-11 NOTE — ED Provider Notes (Signed)
[facility] Emergency Department Provider Note   ____________________________________________    I have reviewed the triage vital signs and the nursing notes.   HISTORY  Chief Complaint Chest Pain     HPI Betty Nielsen is a 74 y.o. female with history of chronic kidney disease, hypertension, CAD with stent placement 2018 who presents with complaints of epigastric/lower chest discomfort.  She reports she is "fine "during the day but when she lays down at night after about an hour she developed a discomfort in her chest.  Frequently she gets up and gets in her recliner and feels much better and sleeps there.  She denies shortness of breath.  No diaphoresis.  Sees Dr. Clayborn Bigness of cardiology.  Currently feels quite well.  No fevers or chills.  Has tried Prilosec with no improvement Past Medical History:  Diagnosis Date  . Arthrosis of knee   . Chronic kidney disease   . Hyperlipidemia   . Hypertension   . Loss of hearing   . Metabolic syndrome   . Plantar fasciitis   . Renal insufficiency     Patient Active Problem List   Diagnosis Date Noted  . PMR (polymyalgia rheumatica) ([place]) 06/15/2017  . Elevated troponin 10/27/2016  . Essential hypertension, malignant 10/27/2016  . Headache 10/27/2016  . CKD (chronic kidney disease), stage III 10/27/2016  . Hyperlipidemia 10/27/2016  . Hyponatremia 10/27/2016  . Angina pectoris ([place]) 10/25/2016  . History of non-ST elevation myocardial infarction (NSTEMI) 10/25/2016  . History of coronary angioplasty 10/25/2016  . BPV (benign positional vertigo) 06/15/2016  . 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[place]) 11/21/2015  . Anemia of chronic disease 06/05/2015  . Acid reflux 06/05/2015  . Hypertension, benign 06/05/2015  . Hypothyroidism, 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  . Major depression in remission ([place]) 06/05/2015  . Obesity (BMI 30.0-34.9) 06/05/2015  . Neuropathy 06/05/2015  . Arthritis, degenerative  07/03/2014  . AAA (abdominal aortic aneurysm) without rupture ([place]) 06/26/2014    Past Surgical History:  Procedure Laterality Date  . ABDOMINAL AORTIC ANEURYSM REPAIR    . ABDOMINAL HYSTERECTOMY    . APPENDECTOMY    . CORONARY STENT INTERVENTION N/A 10/26/2016   Procedure: Coronary Stent Intervention;  Surgeon: Yolonda Kida, MD;  Location: [facility];  Service: Cardiovascular;  Laterality: N/A;  . HERNIA REPAIR    . INNER EAR SURGERY     pt not sure of type  . LEFT HEART CATH AND CORONARY ANGIOGRAPHY N/A 10/26/2016   Procedure: Left Heart Cath and Coronary Angiography;  Surgeon: Teodoro Spray, MD;  Location: [place] [facility];  Service: Cardiovascular;  Laterality: N/A;  . THYROIDECTOMY      Prior to Admission medications   Medication Sig Start Date End Date Taking? Authorizing Provider  acetaminophen (TYLENOL) 500 MG tablet Take 500 mg by mouth every 8 (eight) hours as needed (body aches).     [provider]  aspirin 81 MG tablet Take 81 mg by mouth daily.    [provider]  cetirizine (ZYRTEC) 10 MG tablet Take 1 tablet (10 mg total) by mouth daily. Patient taking differently: Take 10 mg by mouth daily. PRN only 11/01/17   Steele Sizer, MD  Cholecalciferol (VITAMIN D-1000 MAX ST) 1000 units tablet Take by mouth.    [provider]  cloNIDine (CATAPRES) 0.2 MG tablet Take 0.2 mg by mouth 2 (two) times daily.    Kolluru, Sarath, MD  irbesartan (AVAPRO) 300 MG tablet Take 1 tablet (300  mg total) by mouth daily. 12/14/16   Steele Sizer, MD  labetalol (NORMODYNE) 100 MG tablet Take 1 tablet by mouth 2 (two) times daily.  10/27/17   Callwood, Karma Greaser D, MD  levothyroxine (SYNTHROID) 75 MCG tablet Take 1 tablet (75 mcg total) by mouth daily before breakfast. 06/20/19   Ancil Boozer, Drue Stager, MD  nitroGLYCERIN (NITROSTAT) 0.4 MG SL tablet Place 1 tablet (0.4 mg total) under the tongue every 5 (five) minutes as needed for chest pain. 10/27/16   Theodoro Grist, MD  Omega-3 Fatty Acids (FISH OIL) 1000 MG CPDR Take 2 capsules by mouth daily.    [provider]  sucralfate (CARAFATE) 1 g tablet Take 1 tablet (1 g total) by mouth 4 (four) times daily -  with meals and at bedtime for 15 days. 10/11/19 10/26/19  Lavonia Drafts, MD     Allergies Ace inhibitors, Ciprofloxacin, Citalopram, Hydrocodone-acetaminophen, Norvasc [amlodipine besylate], Pantoprazole sodium, Pravastatin, and Sulfa antibiotics  Family History  Problem Relation Age of Onset  . Healthy Mother   . Cerebral aneurysm Father     Social History Social History   Tobacco Use  . Smoking status: Former Smoker    Packs/day: 0.50    Years: 20.00    Pack years: 10.00    Types: Cigarettes    Quit date: 2000    Years since quitting: 21.0  . Smokeless tobacco: Never Used  . Tobacco comment: smoking cessation materials not required  Substance Use Topics  . Alcohol use: No    Alcohol/week: 0.0 standard drinks  . Drug use: No    Review of Systems  Constitutional: No fever/chills Eyes: No visual changes.  ENT: No sore throat. Cardiovascular: Denies chest pain. Respiratory: Denies shortness of breath. Gastrointestinal: No abdominal pain.  No nausea, no vomiting.   Genitourinary: Negative for dysuria. Musculoskeletal: Negative for back pain. Skin: Negative for rash. Neurological: Negative for headaches or weakness   ____________________________________________   PHYSICAL EXAM:  VITAL SIGNS: ED Triage Vitals [10/11/19 1019]  Enc Vitals Group     BP (!) 153/80     Pulse Rate 66     Resp 16     Temp 98.3 F (36.8 C)     Temp Source Oral     SpO2 99 %     Weight      Height      Head Circumference      Peak Flow      Pain Score 3     Pain Loc      Pain Edu?      Excl. in [place]?     Constitutional: Alert and oriented. No acute distress.    Mouth/Throat: Mucous membranes are moist.    Cardiovascular: Normal rate, regular rhythm. Grossly normal heart  sounds.  Good peripheral circulation. Respiratory: Normal respiratory effort.  No retractions. Gastrointestinal: Soft and nontender. No distention.  No CVA tenderness.  Musculoskeletal: No lower extremity tenderness nor edema.  Warm and well perfused Neurologic:  Normal speech and language. No gross focal neurologic deficits are appreciated.  Skin:  Skin is warm, dry and intact. No rash noted. Psychiatric: Mood and affect are normal. Speech and behavior are normal.  ____________________________________________   LABS (all labs ordered are listed, but only abnormal results are displayed)  Labs Reviewed  BASIC METABOLIC PANEL - Abnormal; Notable for the following components:      Result Value   Glucose, Bld 120 (*)    Creatinine, Ser 1.73 (*)    GFR  calc non Af Amer 29 (*)    GFR calc Af Amer 33 (*)    All other components within normal limits  CBC - Abnormal; Notable for the following components:   RBC 3.60 (*)    Hemoglobin 10.7 (*)    HCT 33.5 (*)    All other components within normal limits  TROPONIN I (HIGH SENSITIVITY)   ____________________________________________  EKG  ED ECG REPORT I, Lavonia Drafts, the attending physician, personally viewed and interpreted this ECG.  Date: 10/11/2019  Rhythm: normal sinus rhythm QRS Axis: normal Intervals: normal ST/T Wave abnormalities: normal Narrative Interpretation: no evidence of acute ischemia  ____________________________________________  RADIOLOGY  Chest x-ray unremarkable ____________________________________________   PROCEDURES  Procedure(s) performed: No  Procedures   Critical Care performed: No ____________________________________________   INITIAL IMPRESSION / ASSESSMENT AND PLAN / ED COURSE  Pertinent labs & imaging results that were available during my care of the patient were reviewed by me and considered in my medical decision making (see chart for details).  Patient with discomfort with  lying down in particular on her back since Sunday.  EKG and troponin are reassuring.  Symptoms not consistent with ACS, dissection or PE.  Strongly suspicious for gastritis/PUD.  Lab work is overall unremarkable, patient has a history of chronic kidney disease, creatinine is minimally elevated over prior.  We will increase her Prilosec and start Carafate and have her follow-up with GI for possible PUD.  Notified Dr. Clayborn Bigness as well  Did discuss strict return precautions, given her history    ____________________________________________   FINAL CLINICAL IMPRESSION(S) / ED DIAGNOSES  Final diagnoses:  Nonspecific chest pain  Acute gastritis without hemorrhage, unspecified gastritis type        Note:  This document was prepared using Dragon voice recognition software and may include unintentional dictation errors.   Lavonia Drafts, MD 10/11/19 1150

## 2019-10-11 NOTE — ED Notes (Signed)
See triage note  Presents with upper /nmid chest pain since Monday  States pain increases with inspiration and when lying flat States she tried OTC antacids w/o relief

## 2019-10-11 NOTE — ED Triage Notes (Signed)
Patient presents to the ED with chest discomfort since Monday.  Patient states pain feels sore during the day but feels like a pressure when she lies down at night.  Patient reports having to sleep in a recliner.  Patient does not have any swelling to her legs/feet.  Patient is in no obvious distress at this time.  Patient was sent to the ED from [facility].

## 2019-10-16 ENCOUNTER — Telehealth: Payer: Self-pay | Admitting: Family Medicine

## 2019-10-16 NOTE — Telephone Encounter (Signed)
Pt states that the pharmacy has been trying to contact the office and has not received a response. The company that makes her levothyroxine is no longer making it and [facility] is needing to know which company are you wanting her to the the medication from. Pt only has about 3-4 pills left. Also once this is resolved will someone contact the patient to let her know that it has been done.

## 2019-10-16 NOTE — Telephone Encounter (Signed)
Pharmacy called. They will change manufacturer. Patient has been notified.

## 2019-10-16 NOTE — Telephone Encounter (Signed)
Copied from [place]. Topic: General - Call Back - No Documentation >> Oct 15, 2019  3:22 PM Erick Blinks wrote: Pharmacy called needing a call to verify the new brand of levothyroxine she needs.  Levothyroxine

## 2019-10-25 ENCOUNTER — Emergency Department: Payer: Medicare Other

## 2019-10-25 ENCOUNTER — Other Ambulatory Visit: Payer: Self-pay

## 2019-10-25 DIAGNOSIS — E785 Hyperlipidemia, unspecified: Secondary | ICD-10-CM | POA: Diagnosis present

## 2019-10-25 DIAGNOSIS — Z6833 Body mass index (BMI) 33.0-33.9, adult: Secondary | ICD-10-CM

## 2019-10-25 DIAGNOSIS — Z7982 Long term (current) use of aspirin: Secondary | ICD-10-CM

## 2019-10-25 DIAGNOSIS — I739 Peripheral vascular disease, unspecified: Secondary | ICD-10-CM | POA: Diagnosis present

## 2019-10-25 DIAGNOSIS — D631 Anemia in chronic kidney disease: Secondary | ICD-10-CM | POA: Diagnosis present

## 2019-10-25 DIAGNOSIS — Z87891 Personal history of nicotine dependence: Secondary | ICD-10-CM

## 2019-10-25 DIAGNOSIS — I6381 Other cerebral infarction due to occlusion or stenosis of small artery: Secondary | ICD-10-CM | POA: Diagnosis not present

## 2019-10-25 DIAGNOSIS — I129 Hypertensive chronic kidney disease with stage 1 through stage 4 chronic kidney disease, or unspecified chronic kidney disease: Secondary | ICD-10-CM | POA: Diagnosis present

## 2019-10-25 DIAGNOSIS — R200 Anesthesia of skin: Secondary | ICD-10-CM | POA: Diagnosis present

## 2019-10-25 DIAGNOSIS — E871 Hypo-osmolality and hyponatremia: Secondary | ICD-10-CM | POA: Diagnosis present

## 2019-10-25 DIAGNOSIS — E890 Postprocedural hypothyroidism: Secondary | ICD-10-CM | POA: Diagnosis present

## 2019-10-25 DIAGNOSIS — K219 Gastro-esophageal reflux disease without esophagitis: Secondary | ICD-10-CM | POA: Diagnosis present

## 2019-10-25 DIAGNOSIS — Z8489 Family history of other specified conditions: Secondary | ICD-10-CM

## 2019-10-25 DIAGNOSIS — E669 Obesity, unspecified: Secondary | ICD-10-CM | POA: Diagnosis present

## 2019-10-25 DIAGNOSIS — H9190 Unspecified hearing loss, unspecified ear: Secondary | ICD-10-CM | POA: Diagnosis present

## 2019-10-25 DIAGNOSIS — I2510 Atherosclerotic heart disease of native coronary artery without angina pectoris: Secondary | ICD-10-CM | POA: Diagnosis present

## 2019-10-25 DIAGNOSIS — Z882 Allergy status to sulfonamides status: Secondary | ICD-10-CM

## 2019-10-25 DIAGNOSIS — F329 Major depressive disorder, single episode, unspecified: Secondary | ICD-10-CM | POA: Diagnosis present

## 2019-10-25 DIAGNOSIS — Z79899 Other long term (current) drug therapy: Secondary | ICD-10-CM

## 2019-10-25 DIAGNOSIS — Z8679 Personal history of other diseases of the circulatory system: Secondary | ICD-10-CM

## 2019-10-25 DIAGNOSIS — I160 Hypertensive urgency: Secondary | ICD-10-CM | POA: Diagnosis present

## 2019-10-25 DIAGNOSIS — N183 Chronic kidney disease, stage 3 unspecified: Secondary | ICD-10-CM | POA: Diagnosis present

## 2019-10-25 DIAGNOSIS — M1710 Unilateral primary osteoarthritis, unspecified knee: Secondary | ICD-10-CM | POA: Diagnosis present

## 2019-10-25 DIAGNOSIS — H8110 Benign paroxysmal vertigo, unspecified ear: Secondary | ICD-10-CM | POA: Diagnosis present

## 2019-10-25 DIAGNOSIS — Z20822 Contact with and (suspected) exposure to covid-19: Secondary | ICD-10-CM | POA: Diagnosis present

## 2019-10-25 DIAGNOSIS — Z79890 Hormone replacement therapy (postmenopausal): Secondary | ICD-10-CM

## 2019-10-25 DIAGNOSIS — I639 Cerebral infarction, unspecified: Secondary | ICD-10-CM | POA: Diagnosis not present

## 2019-10-25 DIAGNOSIS — Z888 Allergy status to other drugs, medicaments and biological substances status: Secondary | ICD-10-CM

## 2019-10-25 DIAGNOSIS — I252 Old myocardial infarction: Secondary | ICD-10-CM

## 2019-10-25 DIAGNOSIS — R29700 NIHSS score 0: Secondary | ICD-10-CM | POA: Diagnosis present

## 2019-10-25 DIAGNOSIS — M353 Polymyalgia rheumatica: Secondary | ICD-10-CM | POA: Diagnosis present

## 2019-10-25 LAB — BASIC METABOLIC PANEL
Anion gap: 7 (ref 5–15)
BUN: 17 mg/dL (ref 8–23)
CO2: 25 mmol/L (ref 22–32)
Calcium: 9.2 mg/dL (ref 8.9–10.3)
Chloride: 101 mmol/L (ref 98–111)
Creatinine, Ser: 1.67 mg/dL — ABNORMAL HIGH (ref 0.44–1.00)
GFR calc Af Amer: 35 mL/min — ABNORMAL LOW (ref >60)
GFR calc non Af Amer: 30 mL/min — ABNORMAL LOW (ref >60)
Glucose, Bld: 101 mg/dL — ABNORMAL HIGH (ref 70–99)
Potassium: 3.9 mmol/L (ref 3.5–5.1)
Sodium: 133 mmol/L — ABNORMAL LOW (ref 135–145)

## 2019-10-25 LAB — CBC
HCT: 32 % — ABNORMAL LOW (ref 36.0–46.0)
Hemoglobin: 10.6 g/dL — ABNORMAL LOW (ref 12.0–15.0)
MCH: 30.2 pg (ref 26.0–34.0)
MCHC: 33.1 g/dL (ref 30.0–36.0)
MCV: 91.2 fL (ref 80.0–100.0)
Platelets: 269 10*3/uL (ref 150–400)
RBC: 3.51 MIL/uL — ABNORMAL LOW (ref 3.87–5.11)
RDW: 12.1 % (ref 11.5–15.5)
WBC: 6.7 10*3/uL (ref 4.0–10.5)
nRBC: 0 % (ref 0.0–0.2)

## 2019-10-25 LAB — TROPONIN I (HIGH SENSITIVITY): Troponin I (High Sensitivity): 7 ng/L (ref <18)

## 2019-10-25 IMAGING — CR DG CHEST 2V
2 series · 2 of 2 positions shown · non-contrast
Comparison: [DATE]

CLINICAL DATA: Cardiac palpitations

EXAM:
CHEST - 2 VIEW

[chest pa]
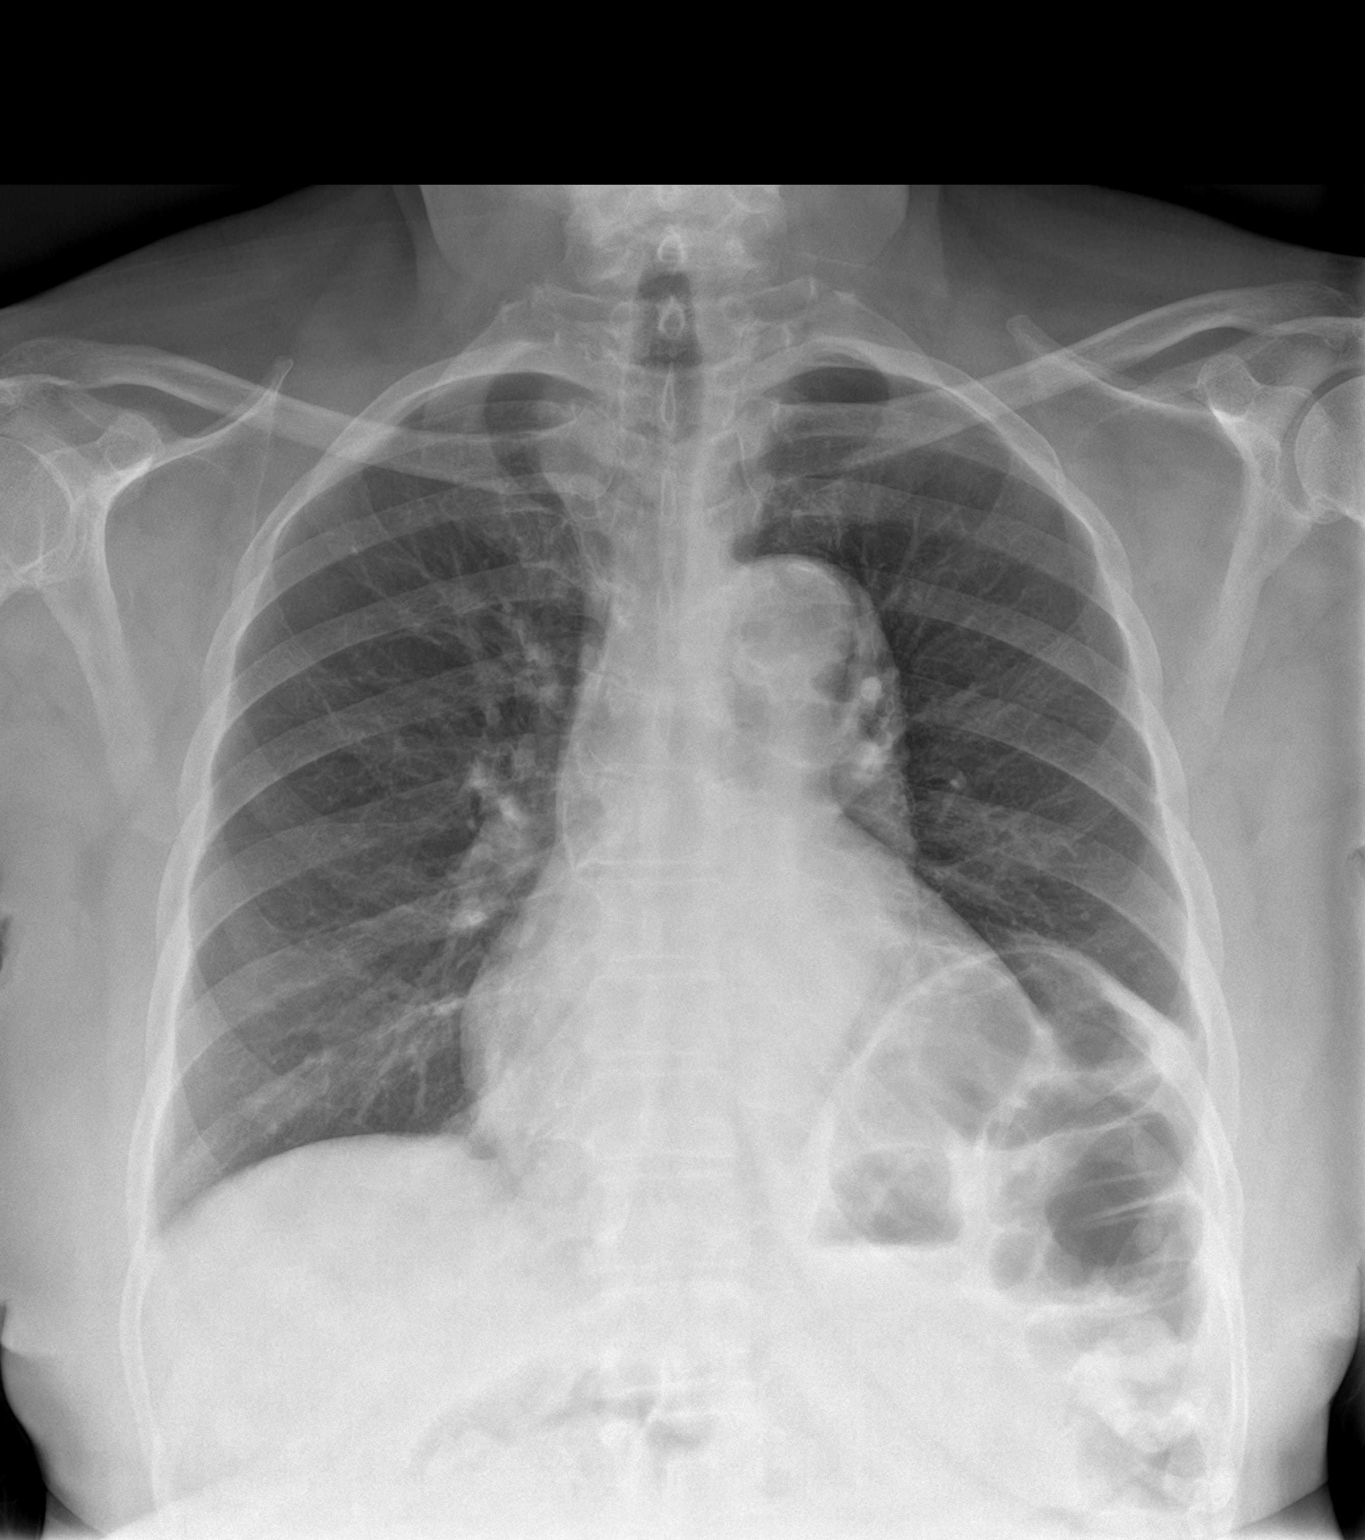

[chest lat]
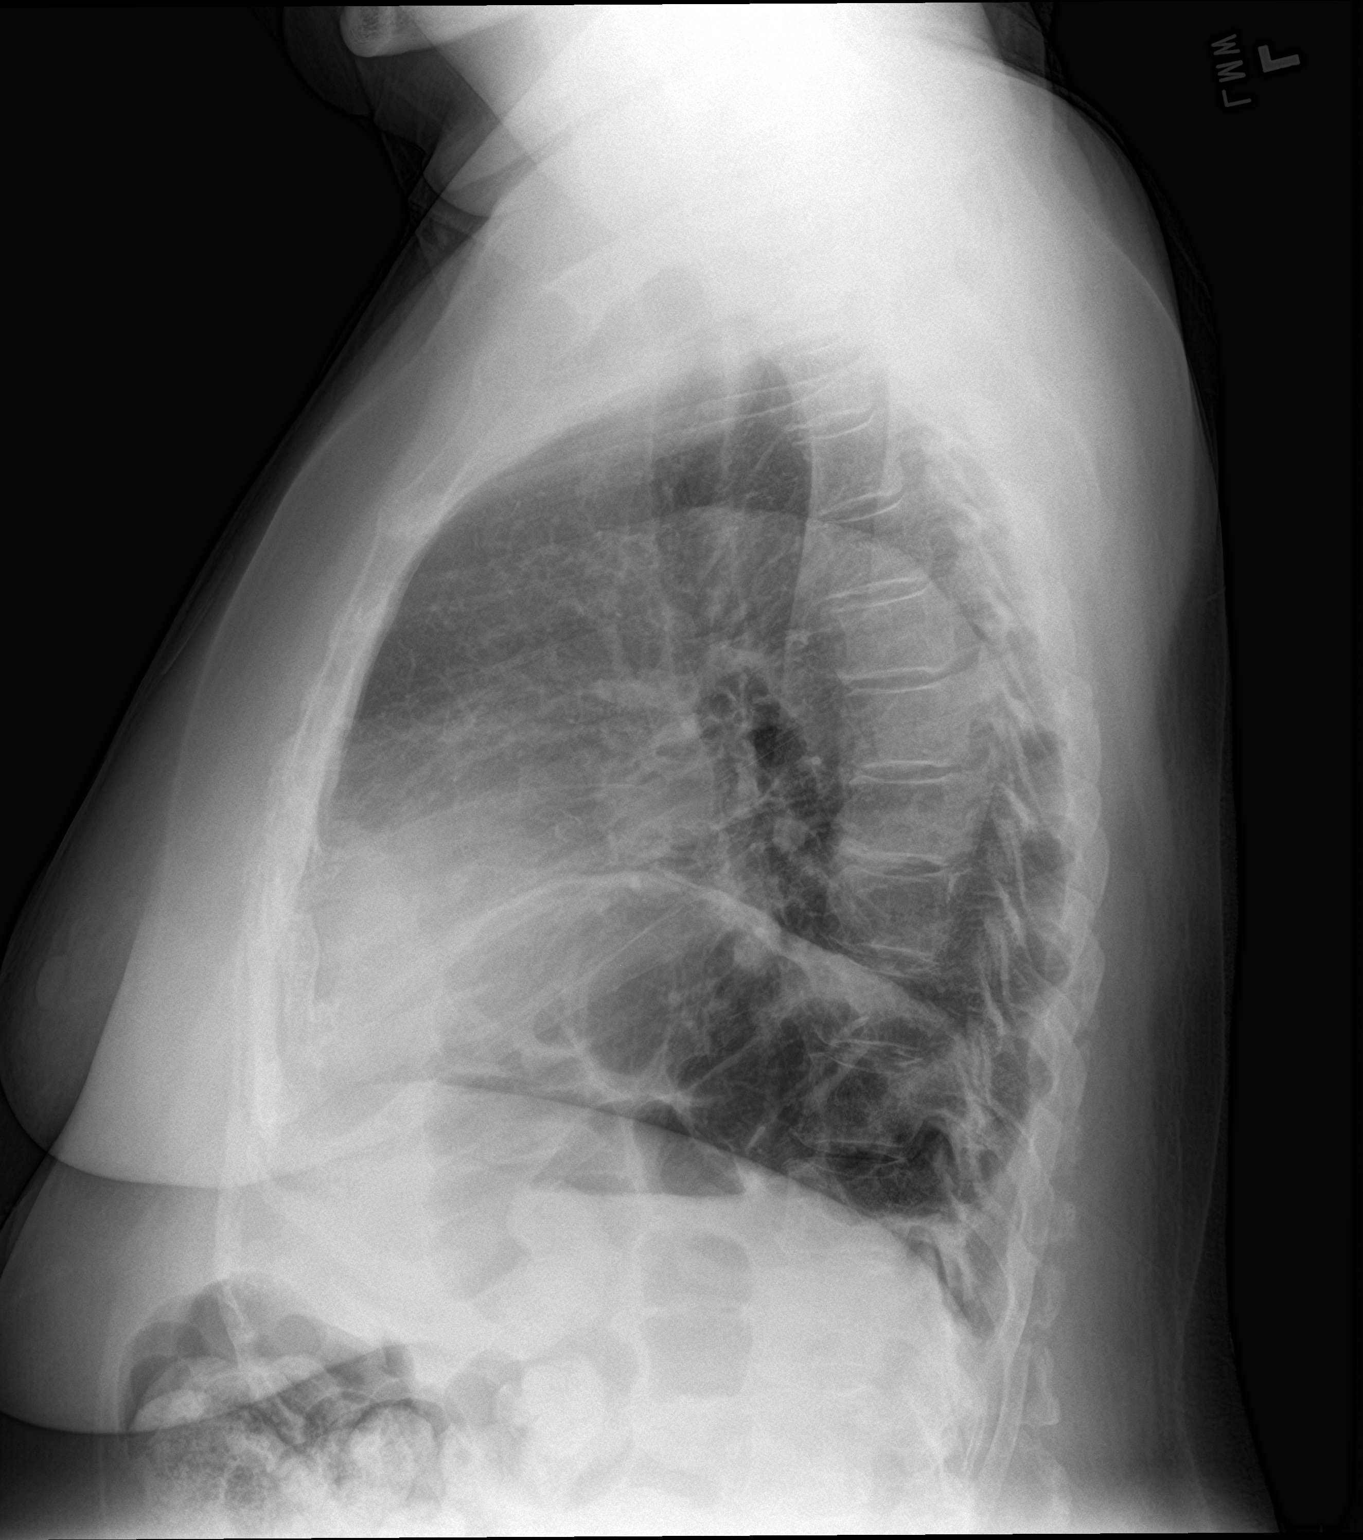

[2 of 2 positions shown; findings below may reference images not displayed]

FINDINGS: Cardiac shadow is stable. Aortic calcifications are again seen.
Elevation of left hemidiaphragm is again noted. No bony abnormality
is seen.
IMPRESSION: No acute abnormality seen.

## 2019-10-25 NOTE — ED Triage Notes (Addendum)
Patient reports she was mopping earlier today. Took a nap at 2pm; when she awoke she couldn't move her left arm. Patient denies current numbness in left leg and arm; reports instead intermittent symptoms. Patient reports recent thyroid medication change. Patient c/o intermittent chest pain/palpitations.

## 2019-10-26 ENCOUNTER — Observation Stay: Payer: Medicare Other

## 2019-10-26 ENCOUNTER — Emergency Department: Payer: Medicare Other

## 2019-10-26 ENCOUNTER — Encounter: Payer: Self-pay | Admitting: Internal Medicine

## 2019-10-26 ENCOUNTER — Inpatient Hospital Stay
Admission: EM | Admit: 2019-10-26 | Discharge: 2019-10-28 | DRG: 065 | Disposition: A | Discharge status: Home / Self Care | Payer: Medicare Other | Attending: Hospitalist | Admitting: Hospitalist

## 2019-10-26 DIAGNOSIS — I639 Cerebral infarction, unspecified: Secondary | ICD-10-CM | POA: Diagnosis not present

## 2019-10-26 DIAGNOSIS — I160 Hypertensive urgency: Secondary | ICD-10-CM | POA: Diagnosis not present

## 2019-10-26 DIAGNOSIS — E871 Hypo-osmolality and hyponatremia: Secondary | ICD-10-CM | POA: Diagnosis not present

## 2019-10-26 DIAGNOSIS — R0981 Nasal congestion: Secondary | ICD-10-CM

## 2019-10-26 DIAGNOSIS — Z8673 Personal history of transient ischemic attack (TIA), and cerebral infarction without residual deficits: Secondary | ICD-10-CM | POA: Diagnosis present

## 2019-10-26 LAB — TROPONIN I (HIGH SENSITIVITY): Troponin I (High Sensitivity): 7 ng/L (ref <18)

## 2019-10-26 IMAGING — MR MR HEAD W/O CM
10 series · 48 of 48 positions shown · non-contrast
Comparison: Head CT earlier this morning.

CLINICAL DATA: 73-year-old female who woke from a nap at [FN] hours
with left upper extremity weakness.

EXAM:
MRI HEAD WITHOUT CONTRAST
TECHNIQUE: Multiplanar, multiecho pulse sequences of the brain and surrounding
structures were obtained without intravenous contrast.

[Series 2: ax dwi_tracew · axial · 3.0mm · 1.31mm/px · z∈[-133,+18]mm · 8 of 48 slices shown]
[im 1/48]
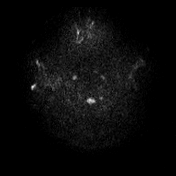
[im 7/48]
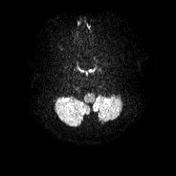
[im 14/48]
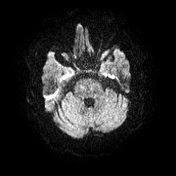
[im 21/48]
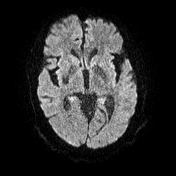
[im 27/48]
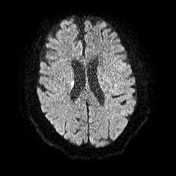
[im 34/48]
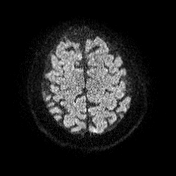
[im 41/48]
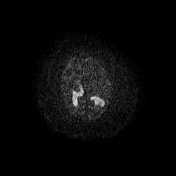
[im 48/48]
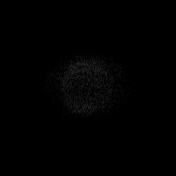

[Series 3: ax dwi_adc · axial · 3.0mm · 1.31mm/px · z∈[-133,+18]mm · 7 of 48 slices shown]
[im 1/48]
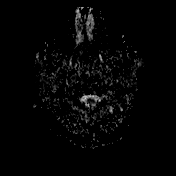
[im 8/48]
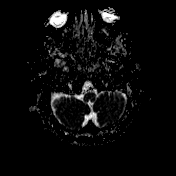
[im 16/48]
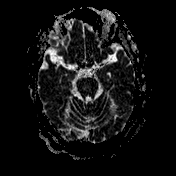
[im 24/48]
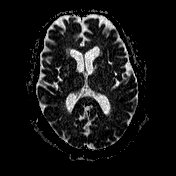
[im 32/48]
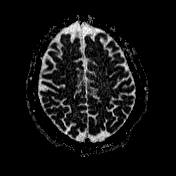
[im 40/48]
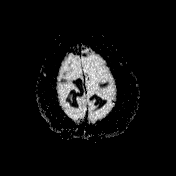
[im 48/48]
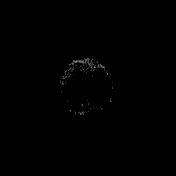

[Series 4: cor dwi_tracew · coronal · 5.0mm · 1.31mm/px · 5 of 38 slices shown]
[im 1/38]
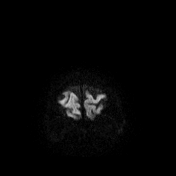
[im 10/38]
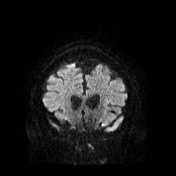
[im 19/38]
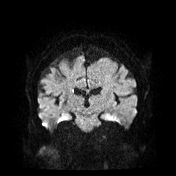
[im 28/38]
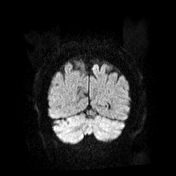
[im 38/38]
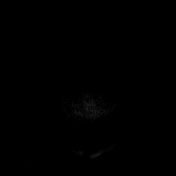

[Series 5: cor dwi_adc · coronal · 5.0mm · 1.31mm/px · 5 of 38 slices shown]
[im 1/38]
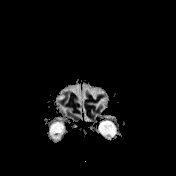
[im 10/38]
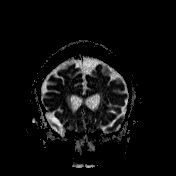
[im 19/38]
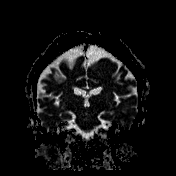
[im 28/38]
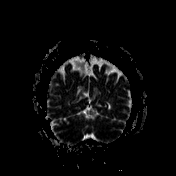
[im 38/38]
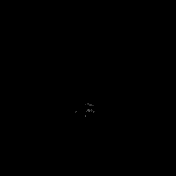

[Series 6: T1 · sagittal · 5.0mm · 0.94mm/px · 3 of 23 slices shown (1 of 2)]
[im 1/23]
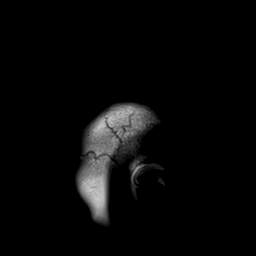
[im 12/23]
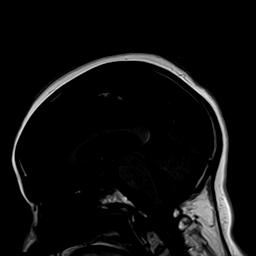
[im 23/23]
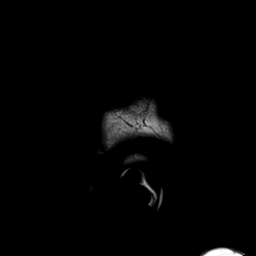

[Series 7: T2 · axial · 5.0mm · 0.45mm/px · z∈[-126,+27]mm · 4 of 27 slices shown (1 of 2)]
[im 1/27]
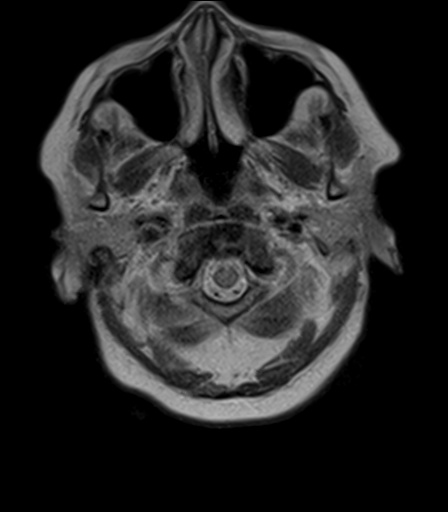
[im 9/27]
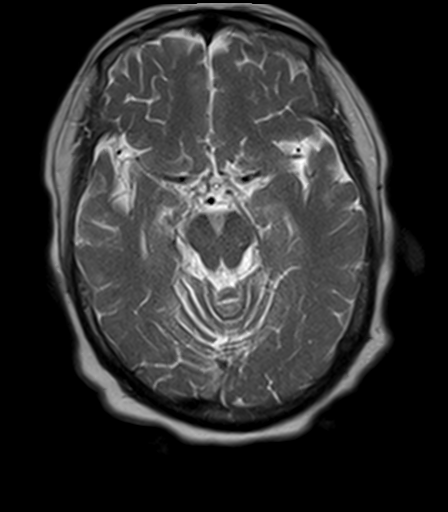
[im 18/27]
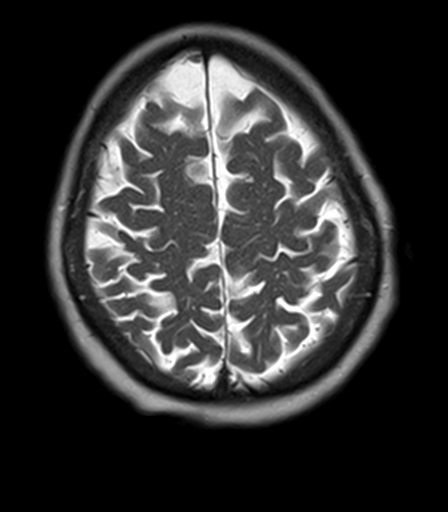
[im 27/27]
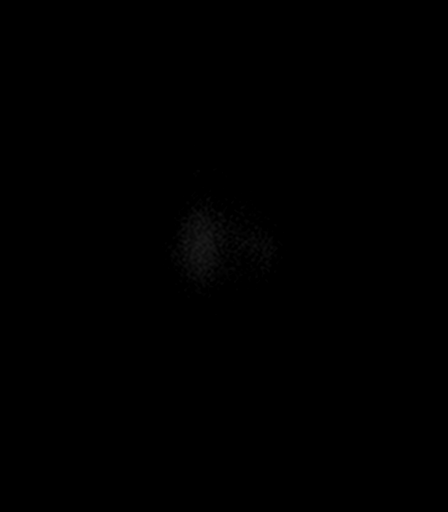

[Series 8: T2-star · axial · 5.0mm · 0.45mm/px · z∈[-126,+27]mm · 4 of 27 slices shown]
[im 1/27]
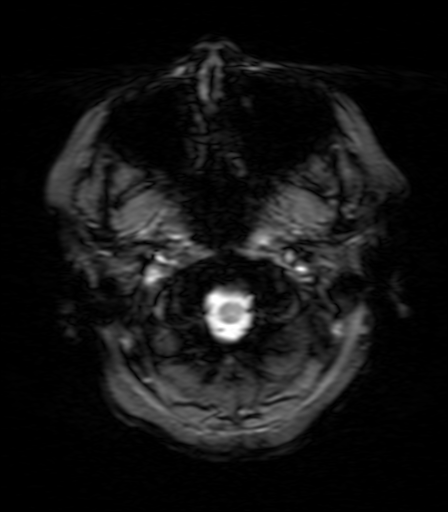
[im 9/27]
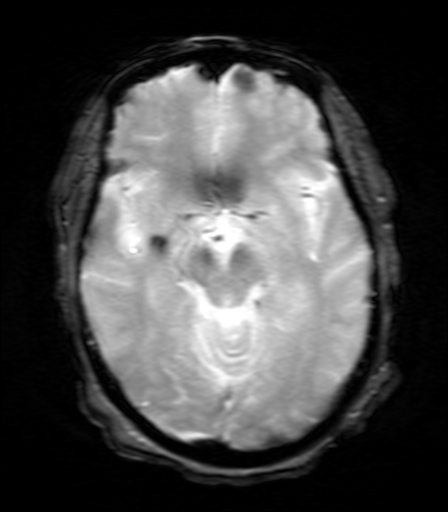
[im 18/27]
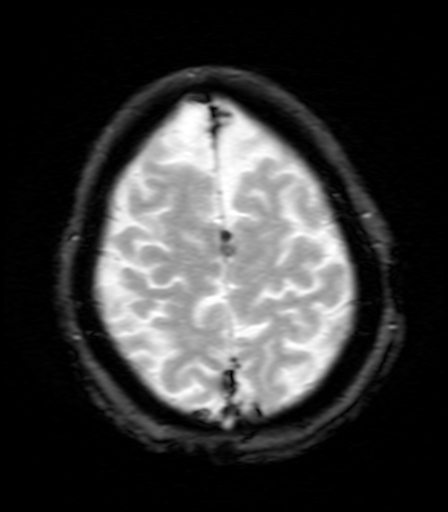
[im 27/27]
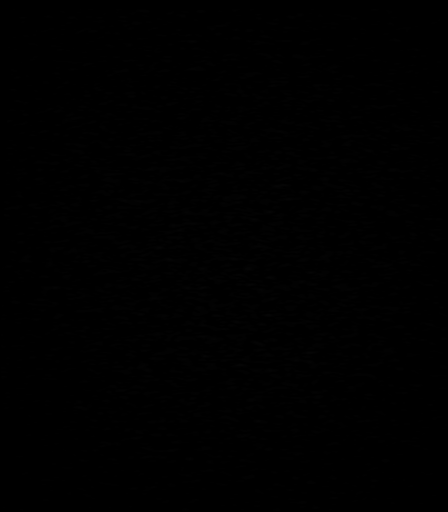

[Series 9: FLAIR · axial · 5.0mm · 1.20mm/px · z∈[-126,+27]mm · 4 of 27 slices shown]
[im 1/27]
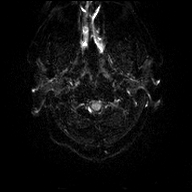
[im 9/27]
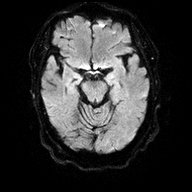
[im 18/27]
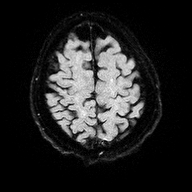
[im 27/27]
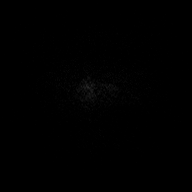

[Series 10: T1 · axial · 5.0mm · 0.90mm/px · z∈[-126,+27]mm · 4 of 27 slices shown (2 of 2)]
[im 1/27]
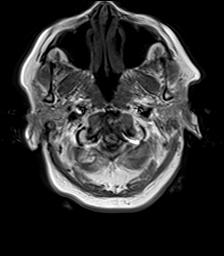
[im 9/27]
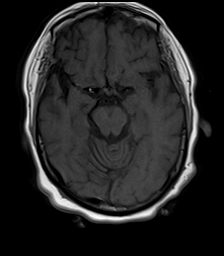
[im 18/27]
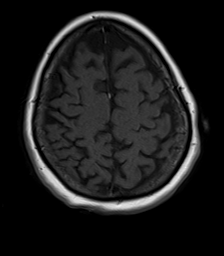
[im 27/27]
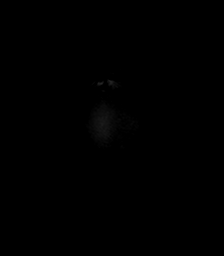

[Series 11: T2 · coronal · 5.0mm · 0.45mm/px · 4 of 31 slices shown (2 of 2)]
[im 1/31]
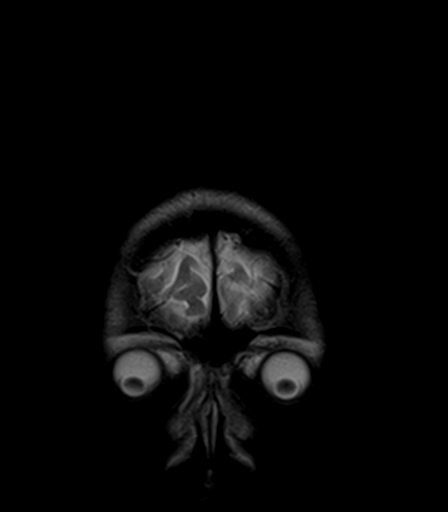
[im 11/31]
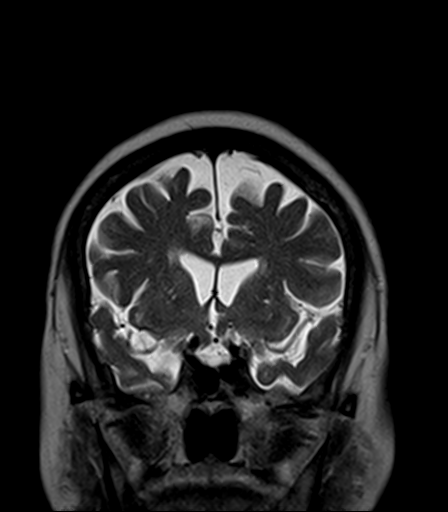
[im 21/31]
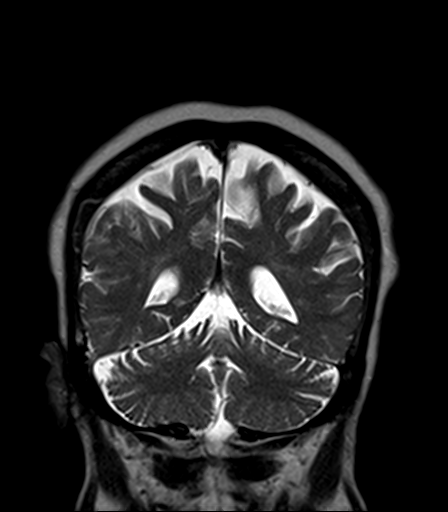
[im 31/31]
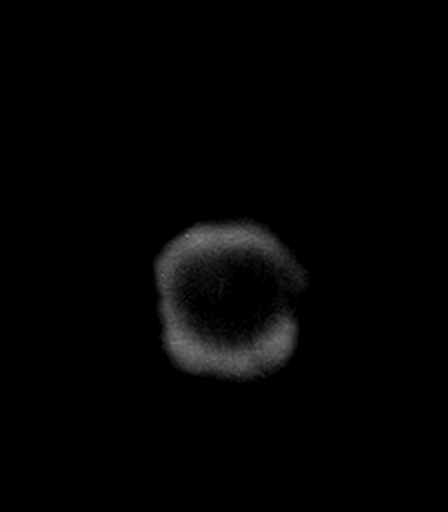

[48 of 48 positions shown; findings below may reference images not displayed]

FINDINGS: Brain: There is a small 6 millimeter focus of restricted diffusion
in the body of the right caudate nucleus (series 2, image 27 and
series 4, image 18). Mild if any associated T2 and FLAIR
hyperintensity. No associated hemorrhage or mass effect.

Superimposed chronic hemorrhagic lacunar infarct of the right
posterior lentiform which is nearby. No other restricted diffusion.
No other chronic cerebral blood products identified. No midline
shift, mass effect, evidence of mass lesion, ventriculomegaly,
extra-axial collection or acute intracranial hemorrhage.
Cervicomedullary junction and pituitary are within normal limits.

Patchy and scattered bilateral cerebral white matter T2 and FLAIR
hyperintensity. Moderate similar patchy T2 and FLAIR hyperintensity
in the pons. Chronic lacunar infarcts of the left caudate and the
left thalamus. Negative cerebellum. No definite chronic cortical
encephalomalacia. Bulky dural calcifications incidentally noted.

Vascular: Major intracranial vascular flow voids are preserved.

Skull and upper cervical spine: Negative visible cervical spine.
Visualized bone marrow signal is within normal limits.

Sinuses/Orbits: Negative orbits. Paranasal sinuses and mastoids are
stable and well pneumatized.

Other: Grossly negative visible internal auditory structures. Scalp
and face soft tissues appear negative.
IMPRESSION: 1. Small acute lacunar infarct in the right caudate nucleus. No
associated hemorrhage or mass effect.

2. Underlying chronic small vessel disease with previous bilateral
deep gray nuclei lacunar infarcts (chronic hemosiderin associated in
the right lentiform), patchy bilateral cerebral white matter and
pontine signal changes.

## 2019-10-26 IMAGING — CT CT HEAD W/O CM
3 series · 16 of 46 positions shown, 19 images · non-contrast
Comparison: [DATE]

CLINICAL DATA: Left leg and arm numbness

EXAM:
CT HEAD WITHOUT CONTRAST
TECHNIQUE: Contiguous axial images were obtained from the base of the skull
through the vertex without intravenous contrast.

[Series 2: head wo · axial · 0.42mm/px · z∈[-136,-16]mm · 10 of 29 slices shown, 13 images]
[im 3/29  brain]
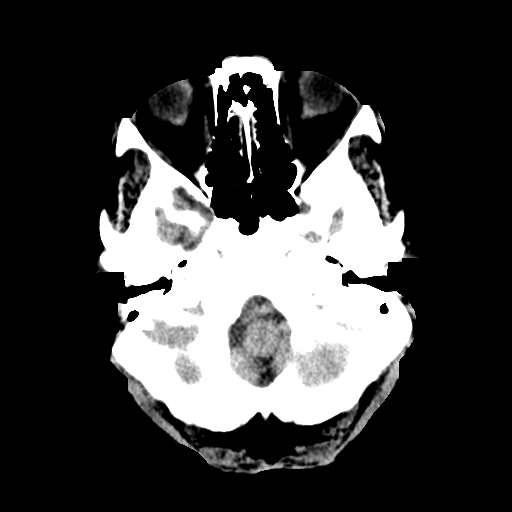
[im 3/29  bone]
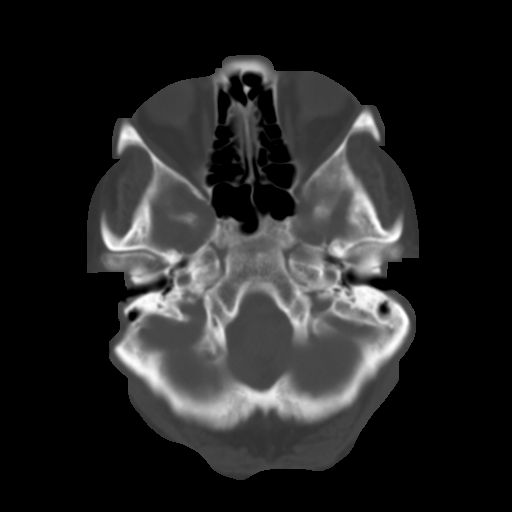
[im 6/29  brain]
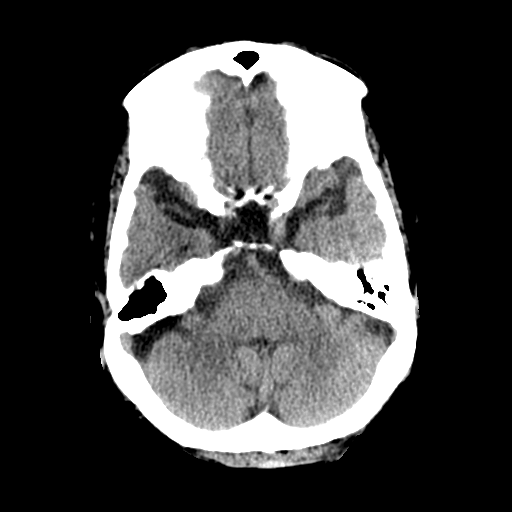
[im 8/29  brain]
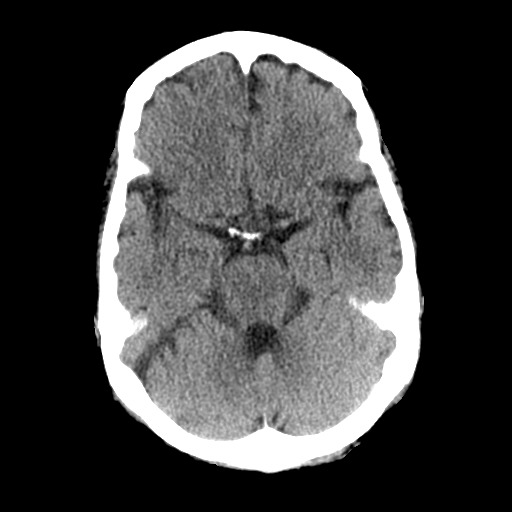
[im 11/29  brain]
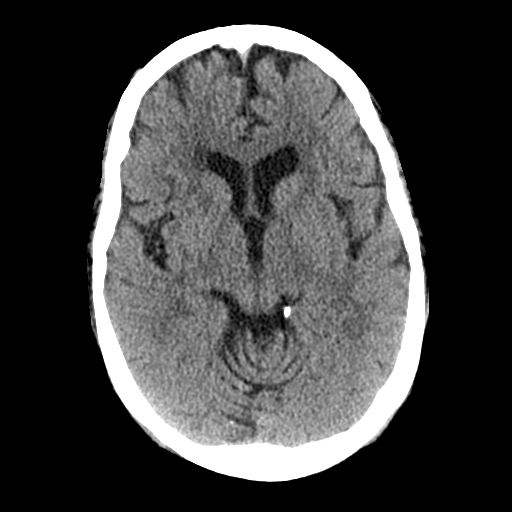
[im 14/29  brain]
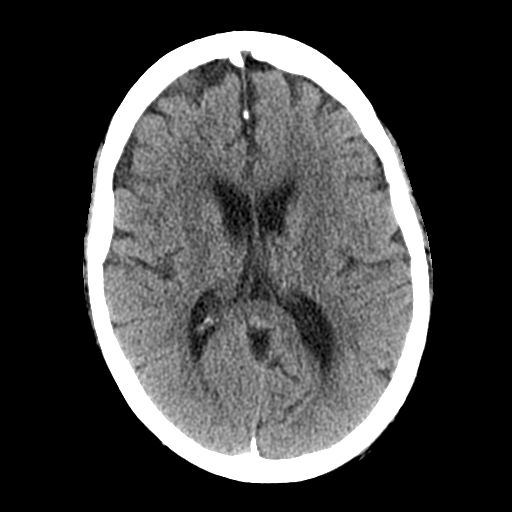
[im 14/29  bone]
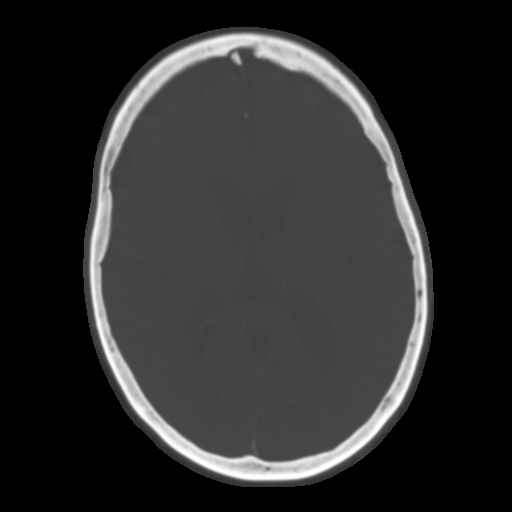
[im 16/29  brain]
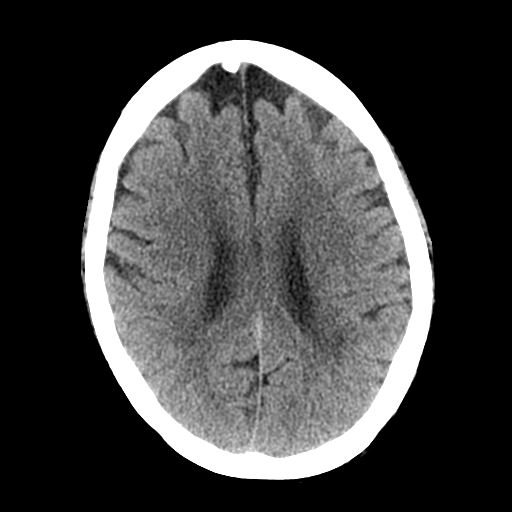
[im 19/29  brain]
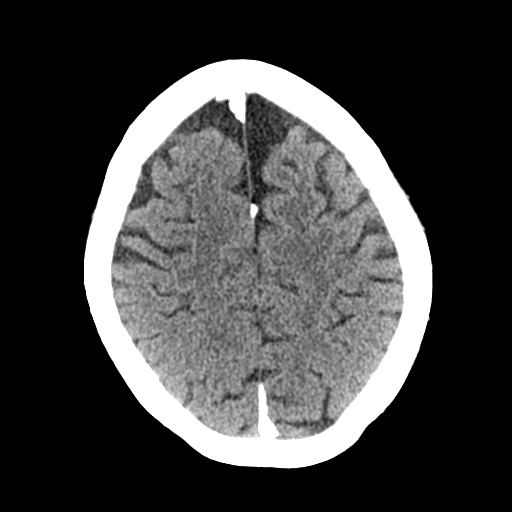
[im 22/29  brain]
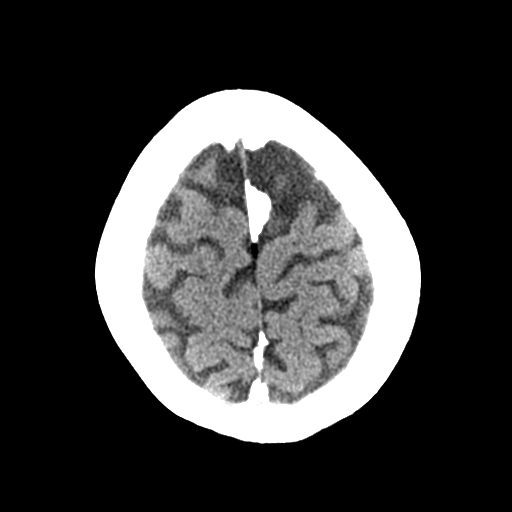
[im 24/29  brain]
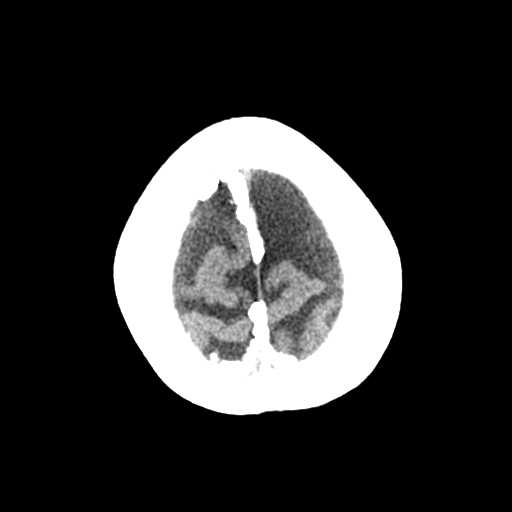
[im 24/29  bone]
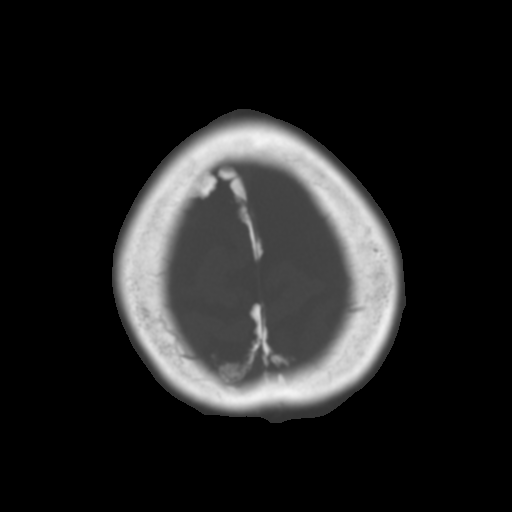
[im 27/29  brain]
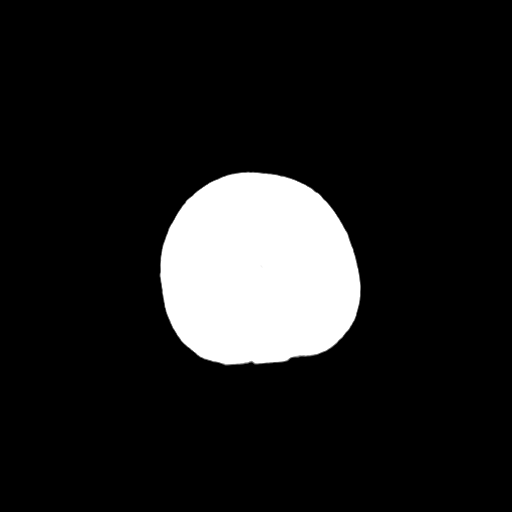

[Series 4: coronal soft tissue · coronal · 0.30mm/px · 3 of 64 slices shown]
[im 22/64  brain]
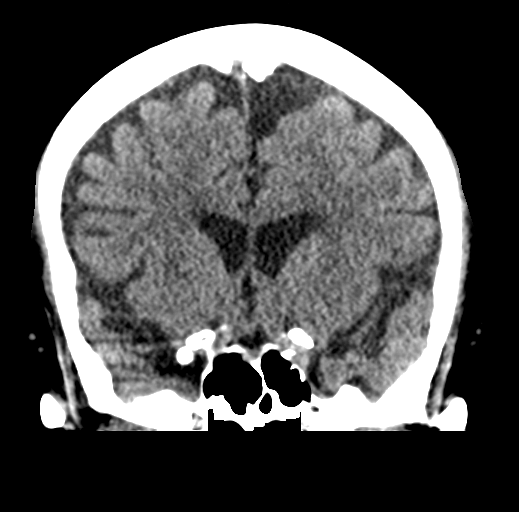
[im 29/64  brain]
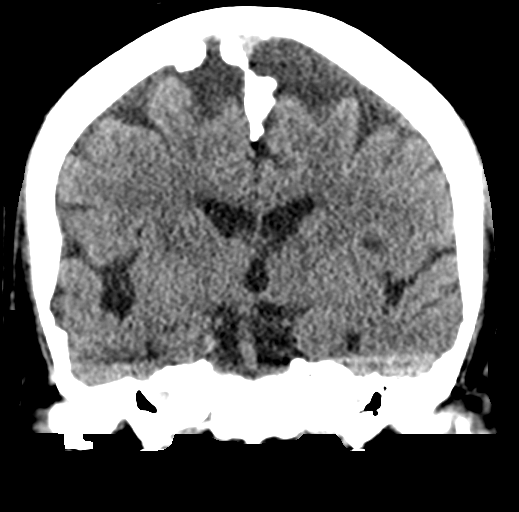
[im 36/64  brain]
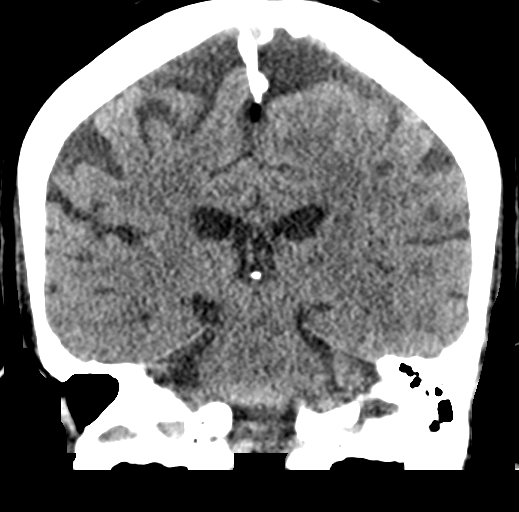

[Series 5: sagittal soft tissue · sagittal · 0.32mm/px · 3 of 51 slices shown]
[im 17/51  brain]
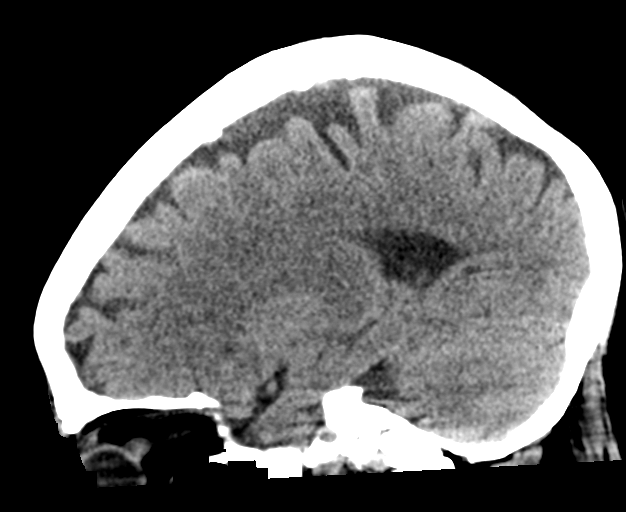
[im 26/51  brain]
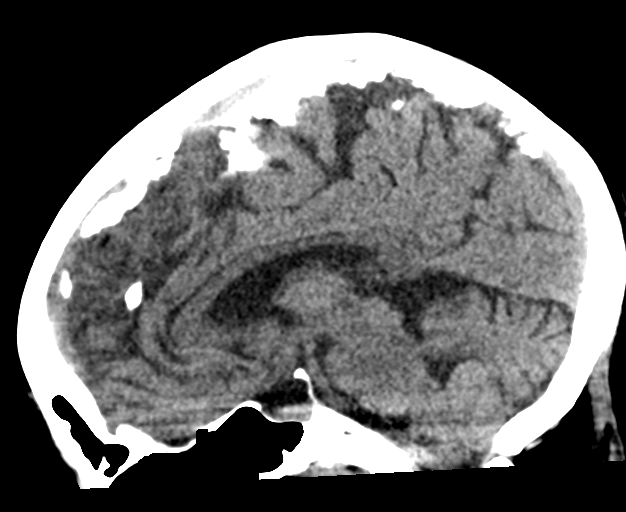
[im 34/51  brain]
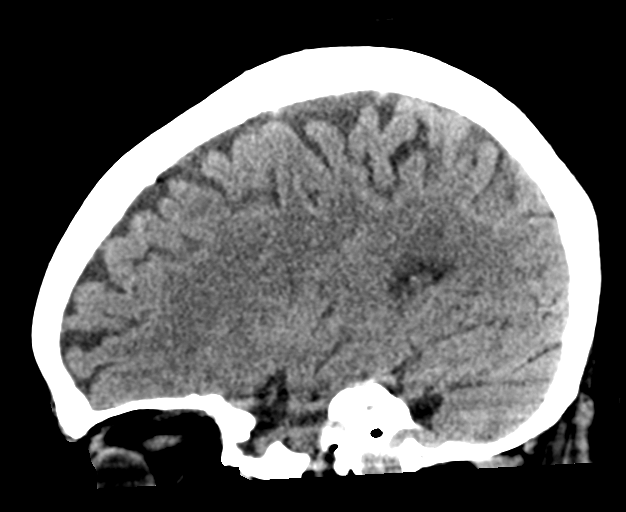

[16 of 46 positions shown; findings below may reference images not displayed]

FINDINGS: Brain: Mild atrophic changes are noted commensurate with the
patient's given age. No findings to suggest acute hemorrhage, acute
infarction or space-occupying mass are noted. Small lacunar infarct
is noted within the left thalamus.

Vascular: No hyperdense vessel or unexpected calcification.

Skull: Normal. Negative for fracture or focal lesion.

Sinuses/Orbits: No acute finding.

Other: None.
IMPRESSION: Chronic atrophic and ischemic changes without acute abnormality.

## 2019-10-26 IMAGING — US US CAROTID DUPLEX BILAT
1 series · 13 of 24 positions shown · non-contrast
Comparison: None.

CLINICAL DATA: 73-year-old female with a history of CVA

EXAM:
BILATERAL CAROTID DUPLEX ULTRASOUND
TECHNIQUE: Gray scale imaging, color Doppler and duplex ultrasound were
performed of bilateral carotid and vertebral arteries in the neck.

[Series 1: us carotid duplex bilat · 13 of 65 slices shown]
[im 1/65]
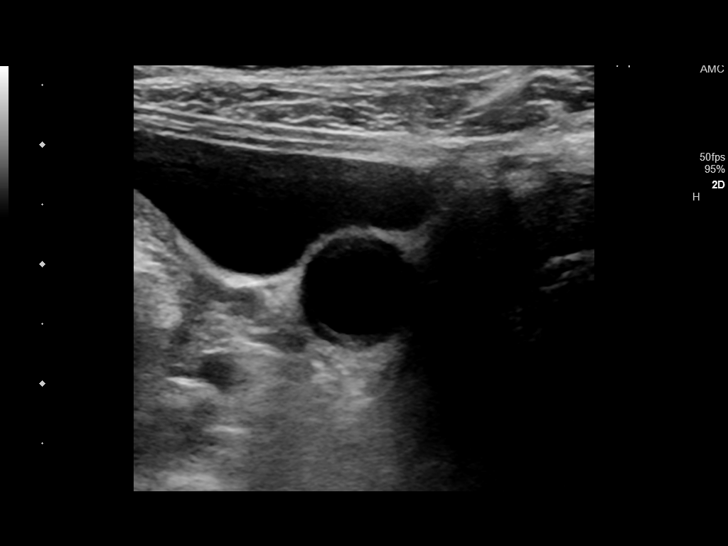
[im 6/65]
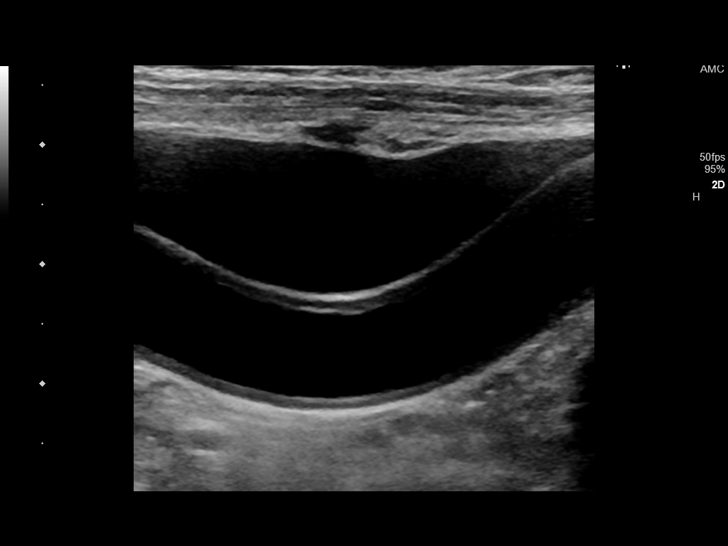
[im 12/65]
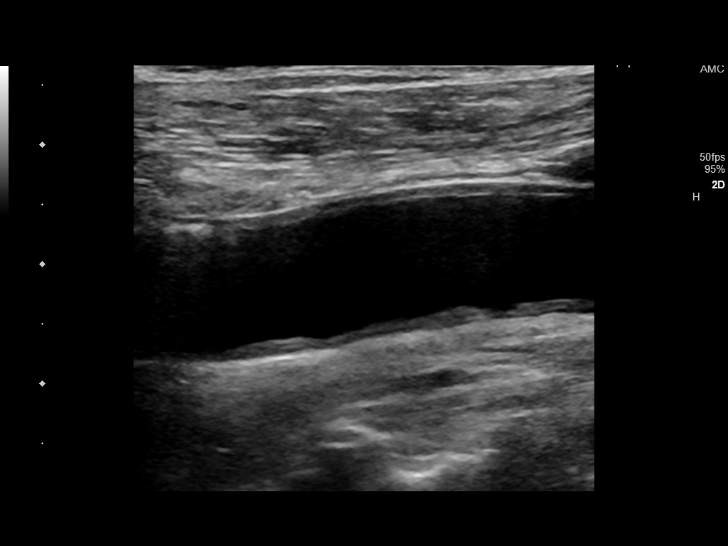
[im 17/65]
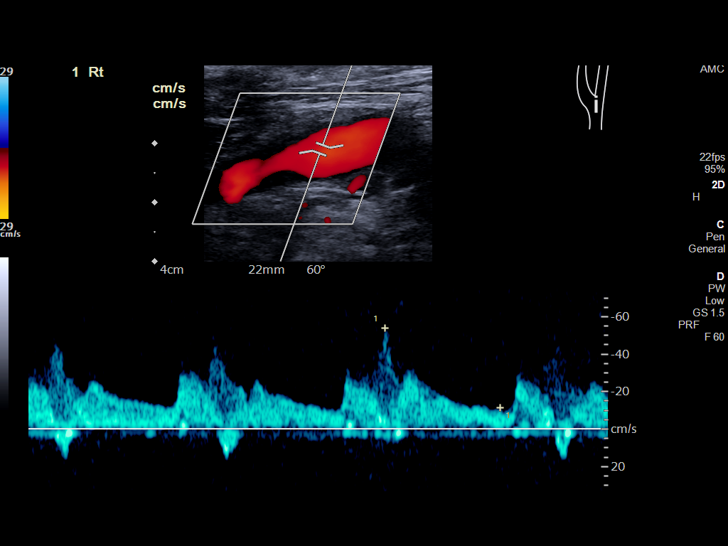
[im 23/65]
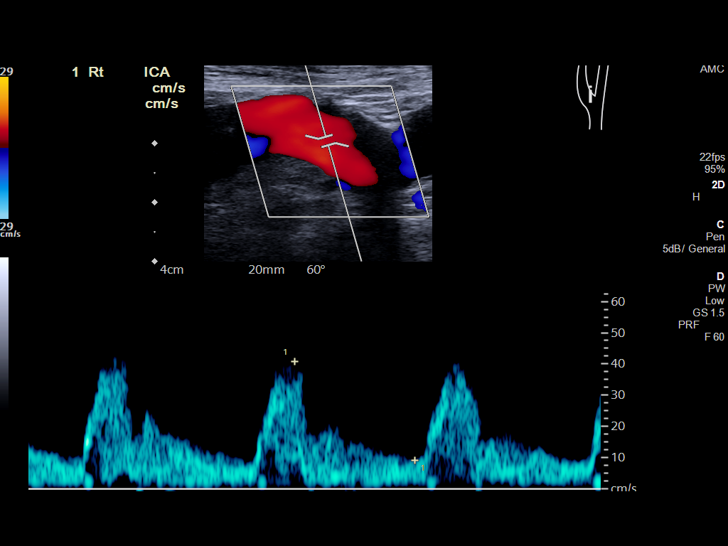
[im 28/65]
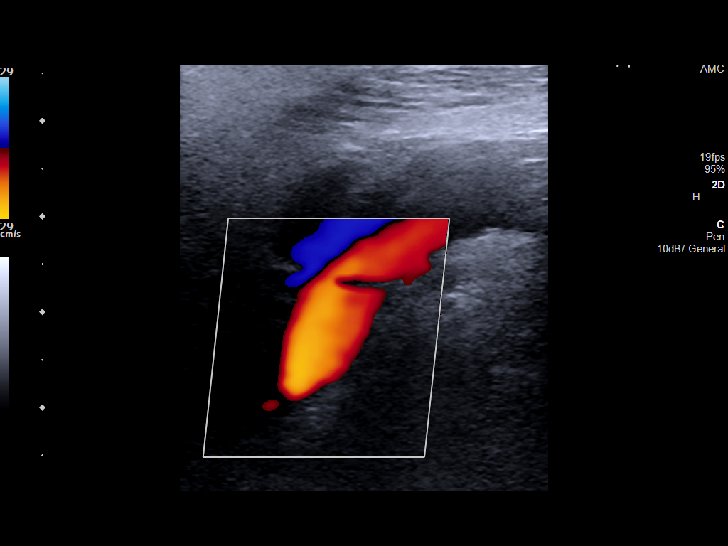
[im 34/65]
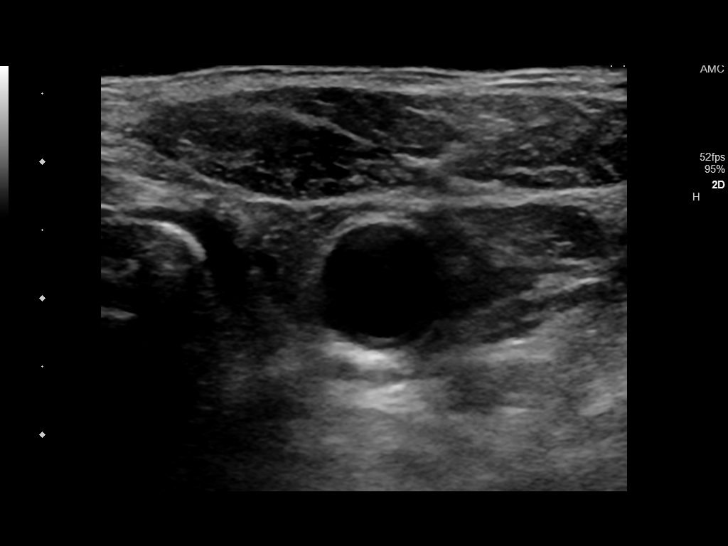
[im 37/65]
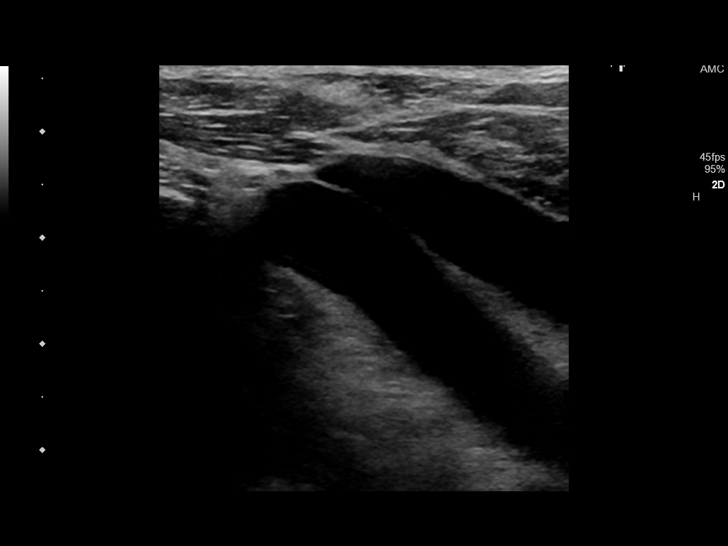
[im 42/65]
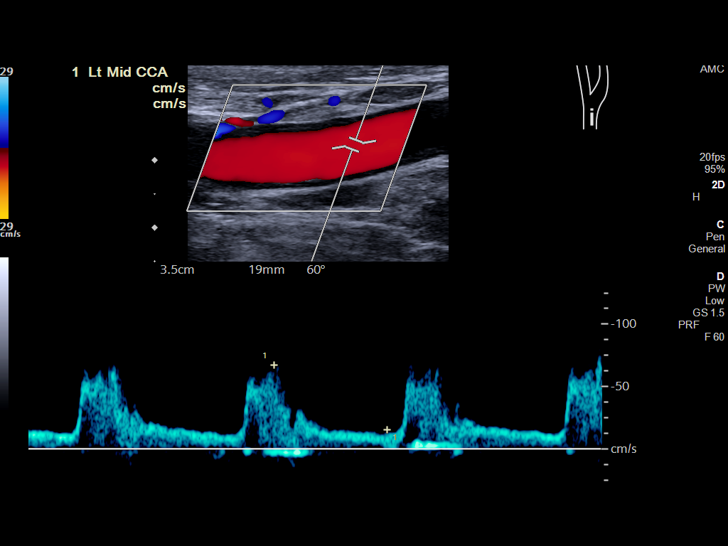
[im 48/65]
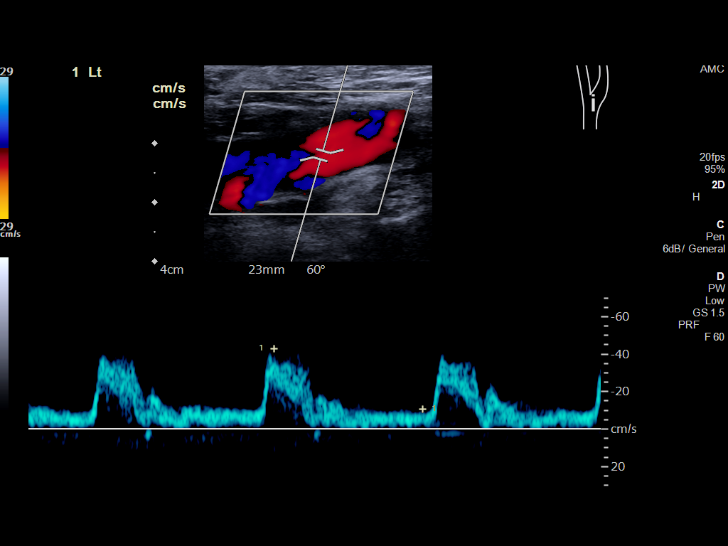
[im 53/65]
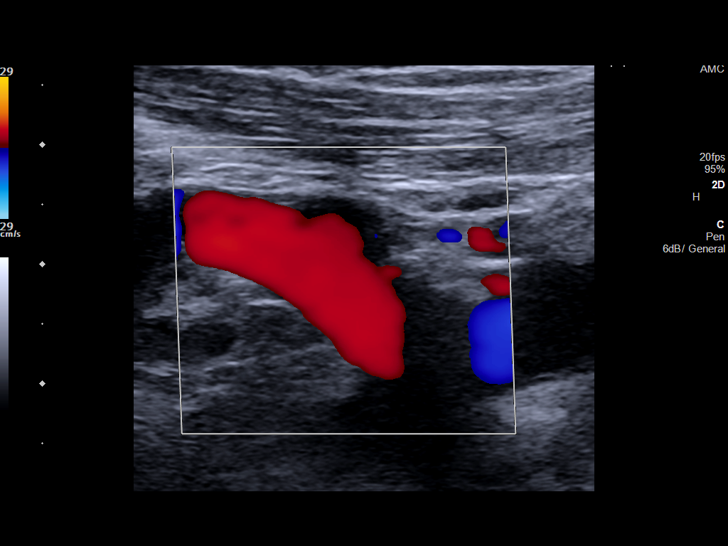
[im 59/65]
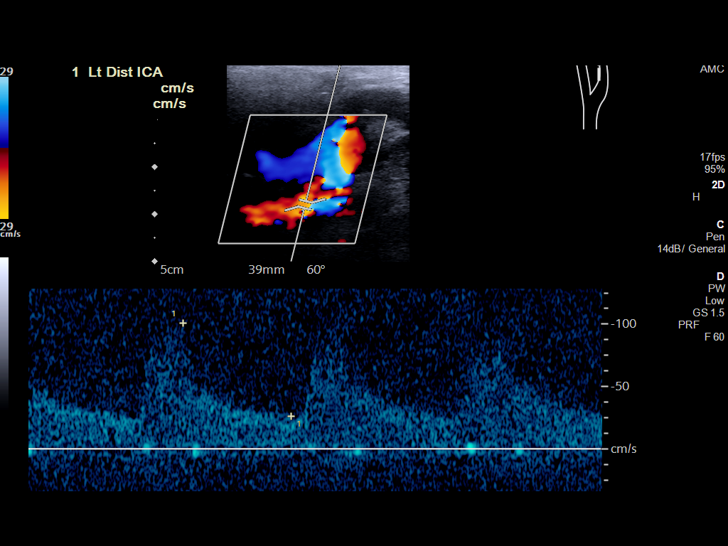
[im 65/65]
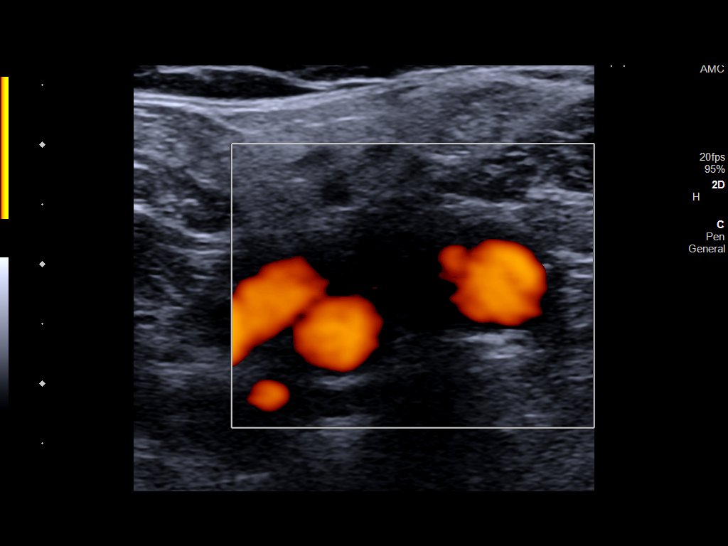

[13 of 24 positions shown; findings below may reference images not displayed]

FINDINGS: Criteria: Quantification of carotid stenosis is based on velocity
parameters that correlate the residual internal carotid diameter
with NASCET-based stenosis levels, using the diameter of the distal
internal carotid lumen as the denominator for stenosis measurement.

The following velocity measurements were obtained:

RIGHT

ICA:  Systolic 91 cm/sec, Diastolic 32 cm/sec

CCA:  61 cm/sec

SYSTOLIC ICA/CCA RATIO:

ECA:  72 cm/sec

LEFT

ICA:  Systolic 101 cm/sec, Diastolic 26 cm/sec

CCA:  67 cm/sec

SYSTOLIC ICA/CCA RATIO:

ECA:  67 cm/sec

Right Brachial SBP: Not acquired

Left Brachial SBP: Not acquired

RIGHT CAROTID ARTERY: No significant calcifications of the right
common carotid artery. Intermediate waveform maintained.
Heterogeneous and partially calcified plaque at the right carotid
bifurcation. Mild lumen shadowing. Low resistance waveform of the
right ICA. No significant tortuosity.

RIGHT VERTEBRAL ARTERY: Antegrade flow with low resistance waveform.

LEFT CAROTID ARTERY: No significant calcifications of the left
common carotid artery. Intermediate waveform maintained.
Heterogeneous and partially calcified plaque at the left carotid
bifurcation with mild lumen shadowing. Low resistance waveform of
the left ICA. No significant tortuosity.

LEFT VERTEBRAL ARTERY:  Antegrade flow with low resistance waveform.
IMPRESSION: Color duplex indicates minimal heterogeneous and calcified plaque,
with no hemodynamically significant stenosis by duplex criteria in
the extracranial cerebrovascular circulation.

## 2019-10-26 MED ORDER — SUCRALFATE 1 G PO TABS
1.0000 g | ORAL_TABLET | Freq: Three times a day (TID) | ORAL | Status: DC
  Administered 2019-10-26 – 2019-10-28 (×8): 1 g via ORAL
  Filled 2019-10-26 (×8): qty 1

## 2019-10-26 MED ORDER — LABETALOL HCL 5 MG/ML IV SOLN
20.0000 mg | Freq: Once | INTRAVENOUS | Status: AC
  Administered 2019-10-26: 20 mg via INTRAVENOUS
  Filled 2019-10-26: qty 4

## 2019-10-26 MED ORDER — ASPIRIN EC 81 MG PO TBEC
81.0000 mg | DELAYED_RELEASE_TABLET | Freq: Every day | ORAL | Status: DC
  Administered 2019-10-27 – 2019-10-28 (×2): 81 mg via ORAL
  Filled 2019-10-26 (×2): qty 1

## 2019-10-26 MED ORDER — LORATADINE 10 MG PO TABS: 10.0000 mg | ORAL_TABLET | Freq: Every day | ORAL | Status: DC | PRN

## 2019-10-26 MED ORDER — NITROGLYCERIN 2 % TD OINT
1.0000 [in_us] | TOPICAL_OINTMENT | Freq: Once | TRANSDERMAL | Status: AC
  Administered 2019-10-26: 1 [in_us] via TOPICAL
  Filled 2019-10-26: qty 1

## 2019-10-26 MED ORDER — ALUM & MAG HYDROXIDE-SIMETH 200-200-20 MG/5ML PO SUSP
15.0000 mL | Freq: Once | ORAL | Status: AC
  Administered 2019-10-26: 15 mL via ORAL
  Filled 2019-10-26: qty 30

## 2019-10-26 MED ORDER — ASPIRIN 81 MG PO CHEW
324.0000 mg | CHEWABLE_TABLET | Freq: Once | ORAL | Status: AC
  Administered 2019-10-26: 324 mg via ORAL
  Filled 2019-10-26: qty 4

## 2019-10-26 MED ORDER — OMEGA-3-ACID ETHYL ESTERS 1 G PO CAPS
1.0000 g | ORAL_CAPSULE | Freq: Every day | ORAL | Status: DC
  Administered 2019-10-27 – 2019-10-28 (×2): 1 g via ORAL
  Filled 2019-10-26 (×2): qty 1

## 2019-10-26 MED ORDER — BISACODYL 5 MG PO TBEC
10.0000 mg | DELAYED_RELEASE_TABLET | Freq: Once | ORAL | Status: AC
  Administered 2019-10-26: 10 mg via ORAL
  Filled 2019-10-26: qty 2

## 2019-10-26 MED ORDER — SENNOSIDES-DOCUSATE SODIUM 8.6-50 MG PO TABS
1.0000 | ORAL_TABLET | Freq: Every evening | ORAL | Status: DC | PRN
  Administered 2019-10-26: 1 via ORAL
  Filled 2019-10-26: qty 1

## 2019-10-26 MED ORDER — LABETALOL HCL 100 MG PO TABS
100.0000 mg | ORAL_TABLET | Freq: Two times a day (BID) | ORAL | Status: DC
  Administered 2019-10-26 – 2019-10-28 (×4): 100 mg via ORAL
  Filled 2019-10-26 (×5): qty 1

## 2019-10-26 MED ORDER — CLONIDINE HCL 0.1 MG PO TABS
0.2000 mg | ORAL_TABLET | Freq: Two times a day (BID) | ORAL | Status: DC
  Administered 2019-10-26 – 2019-10-28 (×4): 0.2 mg via ORAL
  Filled 2019-10-26 (×4): qty 2

## 2019-10-26 MED ORDER — LEVOTHYROXINE SODIUM 50 MCG PO TABS
75.0000 ug | ORAL_TABLET | Freq: Every day | ORAL | Status: DC
  Administered 2019-10-28: 75 ug via ORAL
  Filled 2019-10-26 (×2): qty 1

## 2019-10-26 MED ORDER — STROKE: EARLY STAGES OF RECOVERY BOOK: Freq: Once | Status: AC

## 2019-10-26 MED ORDER — POLYETHYLENE GLYCOL 3350 17 G PO PACK
17.0000 g | PACK | Freq: Every day | ORAL | Status: DC
  Administered 2019-10-26 – 2019-10-28 (×3): 17 g via ORAL
  Filled 2019-10-26 (×3): qty 1

## 2019-10-26 MED ORDER — HYDRALAZINE HCL 20 MG/ML IJ SOLN
20.0000 mg | Freq: Four times a day (QID) | INTRAMUSCULAR | Status: DC | PRN
  Administered 2019-10-26: 20 mg via INTRAVENOUS
  Filled 2019-10-26 (×2): qty 1

## 2019-10-26 MED ORDER — VITAMIN D 25 MCG (1000 UNIT) PO TABS
1000.0000 [IU] | ORAL_TABLET | Freq: Every day | ORAL | Status: DC
  Administered 2019-10-27 – 2019-10-28 (×2): 1000 [IU] via ORAL
  Filled 2019-10-26 (×2): qty 1

## 2019-10-26 MED ORDER — HEPARIN SODIUM (PORCINE) 5000 UNIT/ML IJ SOLN
5000.0000 [IU] | Freq: Three times a day (TID) | INTRAMUSCULAR | Status: DC
  Administered 2019-10-26 – 2019-10-28 (×6): 5000 [IU] via SUBCUTANEOUS
  Filled 2019-10-26 (×6): qty 1

## 2019-10-26 MED ORDER — CLONIDINE HCL 0.1 MG PO TABS
0.2000 mg | ORAL_TABLET | Freq: Once | ORAL | Status: AC
  Administered 2019-10-26: 0.2 mg via ORAL
  Filled 2019-10-26: qty 2

## 2019-10-26 MED ORDER — BISACODYL 5 MG PO TBEC: 10.0000 mg | DELAYED_RELEASE_TABLET | Freq: Every day | ORAL | Status: DC | PRN

## 2019-10-26 NOTE — ED Notes (Signed)
Called pt's daughter in law and provided update.

## 2019-10-26 NOTE — ED Provider Notes (Signed)
[facility] Emergency Department Provider Note  ____________________________________________   First MD Initiated Contact with Patient 10/26/19 0144     (approximate)  I have reviewed the triage vital signs and the nursing notes.   HISTORY  Chief Complaint Numbness   HPI MAICEE ZEBLEY is a 74 y.o. female with below list of previous medical conditions CAD presents to the emergency department secondary to acute onset of left-sided numbness and weakness yesterday.  Patient states that when she attempted to walk that she was dragging her leg.  Patient states that 2 AM this morning symptoms reoccurred while she was taking a nap with what she describes as numbness to the left arm.  Patient denies any headache no chest pain no shortness of breath.  Patient denies any visual changes or dizziness.  Patient denies any symptoms at present        Past Medical History:  Diagnosis Date  . Arthrosis of knee   . Chronic kidney disease   . Hyperlipidemia   . Hypertension   . Loss of hearing   . Metabolic syndrome   . Plantar fasciitis   . Renal insufficiency     Patient Active Problem List   Diagnosis Date Noted  . CVA (cerebral vascular accident) ([place]) 10/26/2019  . Hypertensive urgency 10/26/2019  . PMR (polymyalgia rheumatica) ([place]) 06/15/2017  . Elevated troponin 10/27/2016  . Essential hypertension, malignant 10/27/2016  . Headache 10/27/2016  . CKD (chronic kidney disease), stage III 10/27/2016  . Hyperlipidemia 10/27/2016  . Hyponatremia 10/27/2016  . Angina pectoris ([place]) 10/25/2016  . History of non-ST elevation myocardial infarction (NSTEMI) 10/25/2016  . History of coronary angioplasty 10/25/2016  . BPV (benign positional vertigo) 06/15/2016  . 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[place]) 11/21/2015  . Anemia of chronic disease 06/05/2015  . Acid reflux 06/05/2015  . Hypertension, benign 06/05/2015  . Hypothyroidism, 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  .  Major depression in remission ([place]) 06/05/2015  . Obesity (BMI 30.0-34.9) 06/05/2015  . Neuropathy 06/05/2015  . Arthritis, degenerative 07/03/2014  . AAA (abdominal aortic aneurysm) without rupture ([place]) 06/26/2014    Past Surgical History:  Procedure Laterality Date  . ABDOMINAL AORTIC ANEURYSM REPAIR    . ABDOMINAL HYSTERECTOMY    . APPENDECTOMY    . CORONARY STENT INTERVENTION N/A 10/26/2016   Procedure: Coronary Stent Intervention;  Surgeon: Yolonda Kida, MD;  Location: [facility];  Service: Cardiovascular;  Laterality: N/A;  . HERNIA REPAIR    . INNER EAR SURGERY     pt not sure of type  . LEFT HEART CATH AND CORONARY ANGIOGRAPHY N/A 10/26/2016   Procedure: Left Heart Cath and Coronary Angiography;  Surgeon: Teodoro Spray, MD;  Location: [place] [facility];  Service: Cardiovascular;  Laterality: N/A;  . THYROIDECTOMY      Prior to Admission medications   Medication Sig Start Date End Date Taking? Authorizing Provider  aspirin 81 MG tablet Take 81 mg by mouth daily.   Yes [provider]  cetirizine (ZYRTEC) 10 MG tablet Take 1 tablet (10 mg total) by mouth daily. Patient taking differently: Take 10 mg by mouth daily as needed. PRN only 11/01/17  Yes Sowles, Drue Stager, MD  Cholecalciferol (VITAMIN D-1000 MAX ST) 1000 units tablet Take 1,000 Units by mouth daily.    Yes [provider]  cloNIDine (CATAPRES) 0.2 MG tablet Take 0.2 mg by mouth 2 (two) times daily.   Yes Kolluru, Sarath, MD  irbesartan (AVAPRO) 300 MG tablet Take  1 tablet (300 mg total) by mouth daily. 12/14/16  Yes Sowles, Drue Stager, MD  labetalol (NORMODYNE) 100 MG tablet Take 1 tablet by mouth 2 (two) times daily.  10/27/17  Yes Callwood, Dwayne D, MD  levothyroxine (SYNTHROID) 75 MCG tablet Take 1 tablet (75 mcg total) by mouth daily before breakfast. 06/20/19  Yes Sowles, Drue Stager, MD  Omega-3 Fatty Acids (FISH OIL) 1000 MG CPDR Take 2 capsules by mouth daily.   Yes [provider]   sucralfate (CARAFATE) 1 g tablet Take 1 tablet (1 g total) by mouth 4 (four) times daily -  with meals and at bedtime for 15 days. 10/11/19 10/26/19 Yes Lavonia Drafts, MD    Allergies Ace inhibitors, Ciprofloxacin, Citalopram, Hydrocodone-acetaminophen, Norvasc [amlodipine besylate], Pantoprazole sodium, Pravastatin, and Sulfa antibiotics  Family History  Problem Relation Age of Onset  . Healthy Mother   . Cerebral aneurysm Father     Social History Social History   Tobacco Use  . Smoking status: Former Smoker    Packs/day: 0.50    Years: 20.00    Pack years: 10.00    Types: Cigarettes    Quit date: 2000    Years since quitting: 21.1  . Smokeless tobacco: Never Used  . Tobacco comment: smoking cessation materials not required  Substance Use Topics  . Alcohol use: No    Alcohol/week: 0.0 standard drinks  . Drug use: No    Review of Systems Constitutional: No fever/chills Eyes: No visual changes. ENT: No sore throat. Cardiovascular: Denies chest pain. Respiratory: Denies shortness of breath. Gastrointestinal: No abdominal pain.  No nausea, no vomiting.  No diarrhea.  No constipation. Genitourinary: Negative for dysuria. Musculoskeletal: Negative for neck pain.  Negative for back pain. Integumentary: Negative for rash. Neurological:   Positive for left arm and leg numbness and weakness now resolved.  Negative for headache  ____________________________________________   PHYSICAL EXAM:  VITAL SIGNS: ED Triage Vitals  Enc Vitals Group     BP 10/25/19 2305 (!) 205/93     Pulse Rate 10/25/19 2305 65     Resp 10/25/19 2305 17     Temp 10/25/19 2305 98.2 F (36.8 C)     Temp src --      SpO2 10/25/19 2305 99 %     Weight 10/25/19 2301 84.4 kg (186 lb 1.1 oz)     Height --      Head Circumference --      Peak Flow --      Pain Score --      Pain Loc --      Pain Edu? --      Excl. in [place]? --     Constitutional: Alert and oriented.  Eyes: Conjunctivae are  normal.  Head: Atraumatic.* Mouth/Throat: Patient is wearing a mask. Neck: No stridor.  No meningeal signs.   Cardiovascular: Normal rate, regular rhythm. Good peripheral circulation. Grossly normal heart sounds. Respiratory: Normal respiratory effort.  No retractions. Gastrointestinal: Soft and nontender. No distention.  Musculoskeletal: No lower extremity tenderness nor edema. No gross deformities of extremities. Neurologic:  Normal speech and language. No gross focal neurologic deficits are appreciated.  Skin:  Skin is warm, dry and intact. Psychiatric: Mood and affect are normal. Speech and behavior are normal.  ____________________________________________   LABS (all labs ordered are listed, but only abnormal results are displayed)  Labs Reviewed  BASIC METABOLIC PANEL - Abnormal; Notable for the following components:      Result Value   Sodium 133 (*)  Glucose, Bld 101 (*)    Creatinine, Ser 1.67 (*)    GFR calc non Af Amer 30 (*)    GFR calc Af Amer 35 (*)    All other components within normal limits  CBC - Abnormal; Notable for the following components:   RBC 3.51 (*)    Hemoglobin 10.6 (*)    HCT 32.0 (*)    All other components within normal limits  HEMOGLOBIN A1C  LIPID PANEL  TROPONIN I (HIGH SENSITIVITY)  TROPONIN I (HIGH SENSITIVITY)   ____________________________________________  EKG  ED ECG REPORT I, [place] Vann Okerlund, the attending physician, personally viewed and interpreted this ECG.   Date: 10/25/2019  EKG Time: 11:14 PM  Rate: 67  Rhythm: Normal sinus rhythm  Axis: Left axis deviation  Intervals: Normal  ST&T Change: None  ____________________________________________  RADIOLOGY I, [place] C[place] Franchi, personally viewed and evaluated these images (plain radiographs) as part of my medical decision making, as well as reviewing the written report by the radiologist.  ED MD interpretation: No acute abnormality noted on chest x-ray per  radiologist  CT had revealed chronic atrophy and ischemic changes without any acute abnormality.  MRI revealed a small acute lacunar infarct in the right caudate nucleus Official radiology report(s): DG Chest 2 View  Result Date: 10/25/2019 CLINICAL DATA:  Cardiac palpitations EXAM: CHEST - 2 VIEW COMPARISON:  10/11/2019 FINDINGS: Cardiac shadow is stable. Aortic calcifications are again seen. Elevation of left hemidiaphragm is again noted. No bony abnormality is seen. IMPRESSION: No acute abnormality seen. Electronically Signed   By: Inez Catalina M.D.   On: 10/25/2019 23:44   CT Head Wo Contrast  Result Date: 10/26/2019 CLINICAL DATA:  Left leg and arm numbness EXAM: CT HEAD WITHOUT CONTRAST TECHNIQUE: Contiguous axial images were obtained from the base of the skull through the vertex without intravenous contrast. COMPARISON:  04/13/2013 FINDINGS: Brain: Mild atrophic changes are noted commensurate with the patient's given age. No findings to suggest acute hemorrhage, acute infarction or space-occupying mass are noted. Small lacunar infarct is noted within the left thalamus. Vascular: No hyperdense vessel or unexpected calcification. Skull: Normal. Negative for fracture or focal lesion. Sinuses/Orbits: No acute finding. Other: None. IMPRESSION: Chronic atrophic and ischemic changes without acute abnormality. Electronically Signed   By: Inez Catalina M.D.   On: 10/26/2019 02:38   MR BRAIN WO CONTRAST  Result Date: 10/26/2019 CLINICAL DATA:  74 year old female who woke from a nap at 1400 hours with left upper extremity weakness. EXAM: MRI HEAD WITHOUT CONTRAST TECHNIQUE: Multiplanar, multiecho pulse sequences of the brain and surrounding structures were obtained without intravenous contrast. COMPARISON:  Head CT earlier this morning. FINDINGS: Brain: There is a small 6 millimeter focus of restricted diffusion in the body of the right caudate nucleus (series 2, image 27 and series 4, image 18). Mild if any  associated T2 and FLAIR hyperintensity. No associated hemorrhage or mass effect. Superimposed chronic hemorrhagic lacunar infarct of the right posterior lentiform which is nearby. No other restricted diffusion. No other chronic cerebral blood products identified. No midline shift, mass effect, evidence of mass lesion, ventriculomegaly, extra-axial collection or acute intracranial hemorrhage. Cervicomedullary junction and pituitary are within normal limits. Patchy and scattered bilateral cerebral white matter T2 and FLAIR hyperintensity. Moderate similar patchy T2 and FLAIR hyperintensity in the pons. 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 Negative cerebellum. No definite chronic cortical encephalomalacia. Bulky dural calcifications incidentally noted. Vascular: Major intracranial vascular flow voids  are preserved. Skull and upper cervical spine: Negative visible cervical spine. Visualized bone marrow signal is within normal limits. Sinuses/Orbits: Negative orbits. Paranasal sinuses and mastoids are stable and well pneumatized. Other: Grossly negative visible internal auditory structures. Scalp and face soft tissues appear negative. IMPRESSION: 1. Small acute lacunar infarct in the right caudate nucleus. No associated hemorrhage or mass effect. 2. Underlying chronic small vessel disease with previous bilateral deep gray nuclei lacunar infarcts (chronic hemosiderin associated in the right lentiform), patchy bilateral cerebral white matter and pontine signal changes. Electronically Signed   By: Genevie Ann M.D.   On: 10/26/2019 06:49    ____________________________________________   PROCEDURES    .Critical Care Performed by: Gregor Hams, MD Authorized by: Gregor Hams, MD   Critical care provider statement:    Critical care time was exclusive of:  Separately billable procedures and treating other patients (Hypertensive emergency)   Critical care was time spent  personally by me on the following activities:  Development of treatment plan with patient or surrogate, discussions with consultants, evaluation of patient's response to treatment, examination of patient, obtaining history from patient or surrogate, ordering and performing treatments and interventions, ordering and review of laboratory studies, ordering and review of radiographic studies, pulse oximetry, re-evaluation of patient's condition and review of old charts     ____________________________________________   INITIAL IMPRESSION / MDM / [place] / ED COURSE  As part of my medical decision making, I reviewed the following data within the electronic MEDICAL RECORD NUMBER  74 year old female presented with above-stated history and physical exam with differential diagnosis including acute CVA.  Patient also noted to be markedly hypertensive and as such concern for hypertensive urgency/emergency.  CT head revealed no acute findings however given history MRI ordered which revealed an acute right caudate nucleus lunar infarct.  Patient was given aspirin 3 and 24 mg.  Regarding the patient's hypertension clonidine 0.2 mg p.o. given with no improvement of blood pressure and as such labetalol 20 mg IV administered without much improvement of hypertension.  Patient remains hypertensive at this time and as such 1 inch of Nitropaste applied with blood pressure improvement.  Patient discussed with hospitalist for admission for further evaluation and management of hypertensive urgency and acute CVA.  Patient and family notified of all clinical findings ____________________________________________  FINAL CLINICAL IMPRESSION(S) / ED DIAGNOSES  Final diagnoses:  Cerebrovascular accident (CVA), unspecified mechanism ([place])  Hypertensive urgency     MEDICATIONS GIVEN DURING THIS VISIT:  Medications  senna-docusate (Senokot-S) tablet 1 tablet (1 tablet Oral Given 10/26/19 2109)  heparin injection 5,000  Units (5,000 Units Subcutaneous Given 10/26/19 2109)  aspirin EC tablet 81 mg (has no administration in time range)  cloNIDine (CATAPRES) tablet 0.2 mg (0.2 mg Oral Given 10/26/19 2109)  labetalol (NORMODYNE) tablet 100 mg (100 mg Oral Given 10/26/19 2108)  levothyroxine (SYNTHROID) tablet 75 mcg (has no administration in time range)  sucralfate (CARAFATE) tablet 1 g (1 g Oral Given 10/26/19 2109)  cholecalciferol (VITAMIN D3) tablet 1,000 Units (has no administration in time range)  omega-3 acid ethyl esters (LOVAZA) capsule 1 g (has no administration in time range)  loratadine (CLARITIN) tablet 10 mg (has no administration in time range)  hydrALAZINE (APRESOLINE) injection 20 mg (has no administration in time range)  polyethylene glycol (MIRALAX / GLYCOLAX) packet 17 g (17 g Oral Given 10/26/19 2205)  alum & mag hydroxide-simeth (MAALOX/MYLANTA) 200-200-20 MG/5ML suspension 15 mL (15 mLs Oral Given 10/26/19 [id number])  cloNIDine (CATAPRES) tablet 0.2 mg (0.2 mg Oral Given 10/26/19 0418)  labetalol (NORMODYNE) injection 20 mg (20 mg Intravenous Given 10/26/19 0653)  nitroGLYCERIN (NITROGLYN) 2 % ointment 1 inch (1 inch Topical Given 10/26/19 0722)  aspirin chewable tablet 324 mg (324 mg Oral Given 10/26/19 0806)   stroke: mapping our early stages of recovery book ( Does not apply Given 10/26/19 1622)  bisacodyl (DULCOLAX) EC tablet 10 mg (10 mg Oral Given 10/26/19 2205)     ED Discharge Orders    None      *Please note:  HALIEGH WHISENANT was evaluated in Emergency Department on 10/26/2019 for the symptoms described in the history of present illness. She was evaluated in the context of the global COVID-19 pandemic, which necessitated consideration that the patient might be at risk for infection with the SARS-CoV-2 virus that causes COVID-19. Institutional protocols and algorithms that pertain to the evaluation of patients at risk for COVID-19 are in a state of rapid change based on information released by regulatory  bodies including the CDC and federal and state organizations. These policies and algorithms were followed during the patient's care in the ED.  Some ED evaluations and interventions may be delayed as a result of limited staffing during the pandemic.*  Note:  This document was prepared using Dragon voice recognition software and may include unintentional dictation errors.   Gregor Hams, MD 10/26/19 2232

## 2019-10-26 NOTE — Consult Note (Signed)
Requesting Physician: Derryl Harbor    Chief Complaint: Left sided numbness and weakness  I have been asked by Dr. Derryl Harbor to see this patient in consultation for acute infarct.  HPI: Betty Nielsen is an 74 y.o. female with a history of AAA, anemia, GERD, hypertension, hypothyroidism, depression, obesity, BPPV, CKD stage III, hyperlipidemia CAD, polymyalgia rheumatica presented with on 2/4 with acute onset left sided numbness and weakness.  Patient reports that she was cleaning her house and was at baseline on yesterday noted.  Started to feel poorly and sat down.  Became lethargic and had acute onset of left sided numbness and weakness.  Symptoms lasted about 15 minutes and resolved spontaneously.  Then on attempting to go to sleep that evening had acute onset of left arm numbness and weakness.  Again symptoms resolved spontaneously but patient presented for evaluation at that time.  Initial NIHSS of 0.    Date last known well: 10/25/2019 Time last known well: Time: 21:00 tPA Given: No: Resolution of symptoms  Past Medical History:  Diagnosis Date  . Arthrosis of knee   . Chronic kidney disease   . Hyperlipidemia   . Hypertension   . Loss of hearing   . Metabolic syndrome   . Plantar fasciitis   . Renal insufficiency     Past Surgical History:  Procedure Laterality Date  . ABDOMINAL AORTIC ANEURYSM REPAIR    . ABDOMINAL HYSTERECTOMY    . APPENDECTOMY    . CORONARY STENT INTERVENTION N/A 10/26/2016   Procedure: Coronary Stent Intervention;  Surgeon: Yolonda Kida, MD;  Location: [facility];  Service: Cardiovascular;  Laterality: N/A;  . HERNIA REPAIR    . INNER EAR SURGERY     pt not sure of type  . LEFT HEART CATH AND CORONARY ANGIOGRAPHY N/A 10/26/2016   Procedure: Left Heart Cath and Coronary Angiography;  Surgeon: Teodoro Spray, MD;  Location: [facility];  Service: Cardiovascular;  Laterality: N/A;  . THYROIDECTOMY      Family History  Problem Relation Age of  Onset  . Healthy Mother   . Cerebral aneurysm Father    Social History:  reports that she quit smoking about 21 years ago. Her smoking use included cigarettes. She has a 10.00 pack-year smoking history. She has never used smokeless tobacco. She reports that she does not drink alcohol or use drugs.  Allergies:  Allergies  Allergen Reactions  . Ace Inhibitors Cough  . Ciprofloxacin Other (See Comments)    unknown  . Citalopram     hyponatremia  . Hydrocodone-Acetaminophen Other (See Comments)    unknown  . Norvasc [Amlodipine Besylate]     Pruritus   . Pantoprazole Sodium Other (See Comments)    Cramps  . Pravastatin Other (See Comments)    Stomach cramps  . Sulfa Antibiotics     unknown    Medications: I have reviewed the patient's current medications. Prior to Admission:  Prior to Admission medications   Medication Sig Start Date End Date Taking? Authorizing Provider  aspirin 81 MG tablet Take 81 mg by mouth daily.   Yes [provider]  cetirizine (ZYRTEC) 10 MG tablet Take 1 tablet (10 mg total) by mouth daily. Patient taking differently: Take 10 mg by mouth daily as needed. PRN only 11/01/17  Yes Sowles, Drue Stager, MD  Cholecalciferol (VITAMIN D-1000 MAX ST) 1000 units tablet Take 1,000 Units by mouth daily.    Yes [provider]  cloNIDine (CATAPRES) 0.2 MG  tablet Take 0.2 mg by mouth 2 (two) times daily.   Yes Kolluru, Sarath, MD  irbesartan (AVAPRO) 300 MG tablet Take 1 tablet (300 mg total) by mouth daily. 12/14/16  Yes Sowles, Drue Stager, MD  labetalol (NORMODYNE) 100 MG tablet Take 1 tablet by mouth 2 (two) times daily.  10/27/17  Yes Callwood, Dwayne D, MD  levothyroxine (SYNTHROID) 75 MCG tablet Take 1 tablet (75 mcg total) by mouth daily before breakfast. 06/20/19  Yes Sowles, Drue Stager, MD  Omega-3 Fatty Acids (FISH OIL) 1000 MG CPDR Take 2 capsules by mouth daily.   Yes [provider]  sucralfate (CARAFATE) 1 g tablet Take 1 tablet (1 g total) by  mouth 4 (four) times daily -  with meals and at bedtime for 15 days. 10/11/19 10/26/19 Yes Lavonia Drafts, MD     Scheduled:  ROS: History obtained from the patient  General ROS: negative for - chills, fatigue, fever, night sweats, weight gain or weight loss Psychological ROS: negative for - behavioral disorder, hallucinations, memory difficulties, mood swings or suicidal ideation Ophthalmic ROS: negative for - blurry vision, double vision, eye pain or loss of vision ENT ROS: negative for - epistaxis, nasal discharge, oral lesions, sore throat, tinnitus or vertigo Allergy and Immunology ROS: negative for - hives or itchy/watery eyes Hematological and Lymphatic ROS: negative for - bleeding problems, bruising or swollen lymph nodes Endocrine ROS: negative for - galactorrhea, hair pattern changes, polydipsia/polyuria or temperature intolerance Respiratory ROS: negative for - cough, hemoptysis, shortness of breath or wheezing Cardiovascular ROS: negative for - chest pain, dyspnea on exertion, edema or irregular heartbeat Gastrointestinal ROS: negative for - abdominal pain, diarrhea, hematemesis, nausea/vomiting or stool incontinence Genito-Urinary ROS: negative for - dysuria, hematuria, incontinence or urinary frequency/urgency Musculoskeletal ROS: back pain Neurological ROS: as noted in HPI Dermatological ROS: negative for rash and skin lesion changes  Physical Examination: Blood pressure (!) 171/109, pulse 66, temperature 98.2 F (36.8 C), resp. rate (!) 22, weight 84.4 kg, SpO2 98 %.  HEENT-  Normocephalic, no lesions, without obvious abnormality.  Normal external eye and conjunctiva.  Normal TM's bilaterally.  Normal auditory canals and external ears. Normal external nose, mucus membranes and septum.  Normal pharynx. Cardiovascular- S1, S2 normal, pulses palpable throughout   Lungs- chest clear, no wheezing, rales, normal symmetric air entry Abdomen- soft, non-tender; bowel sounds normal;  no masses,  no organomegaly Extremities- no edema Lymph-no adenopathy palpable Musculoskeletal-no joint tenderness, deformity or swelling Skin-warm and dry, no hyperpigmentation, vitiligo, or suspicious lesions  Neurological Examination   Mental Status: Alert, oriented, thought content appropriate.  Speech fluent without evidence of aphasia.  Able to follow 3 step commands without difficulty. Cranial Nerves: II: Discs flat bilaterally; Visual fields grossly normal, pupils equal, round, reactive to light and accommodation III,IV, VI: ptosis not present, extra-ocular motions intact bilaterally V,VII: smile symmetric, facial light touch sensation normal bilaterally VIII: hearing normal bilaterally IX,X: gag reflex present XI: bilateral shoulder shrug XII: midline tongue extension Motor: Right : Upper extremity   5/5    Left:     Upper extremity   5/5  Lower extremity   5/5     Lower extremity   5/5 Tone and bulk:normal tone throughout; no atrophy noted Sensory: Pinprick and light touch intact throughout, bilaterally Deep Tendon Reflexes: Symmetric throughout Plantars: Right: mute   Left: mute Cerebellar: Normal finger-to-nose and normal heel-to-shin testing bilaterally Gait: not tested due to safety concerns   Laboratory Studies:  Basic Metabolic Panel: Recent Labs  Lab 10/25/19 2317  NA 133*  K 3.9  CL 101  CO2 25  GLUCOSE 101*  BUN 17  CREATININE 1.67*  CALCIUM 9.2    Liver Function Tests: No results for input(s): AST, ALT, ALKPHOS, BILITOT, PROT, ALBUMIN in the last 168 hours. No results for input(s): LIPASE, AMYLASE in the last 168 hours. No results for input(s): AMMONIA in the last 168 hours.  CBC: Recent Labs  Lab 10/25/19 2317  WBC 6.7  HGB 10.6*  HCT 32.0*  MCV 91.2  PLT 269    Cardiac Enzymes: No results for input(s): CKTOTAL, CKMB, CKMBINDEX, TROPONINI in the last 168 hours.  BNP: Invalid input(s): POCBNP  CBG: No results for input(s): GLUCAP  in the last 168 hours.  Microbiology: No results found for this or any previous visit.  Coagulation Studies: No results for input(s): LABPROT, INR in the last 72 hours.  Urinalysis: No results for input(s): COLORURINE, LABSPEC, PHURINE, GLUCOSEU, HGBUR, BILIRUBINUR, KETONESUR, PROTEINUR, UROBILINOGEN, NITRITE, LEUKOCYTESUR in the last 168 hours.  Invalid input(s): APPERANCEUR  Lipid Panel:    Component Value Date/Time   CHOL 323 (H) 06/16/2018 1420   TRIG 187 (H) 06/16/2018 1420   HDL 61 06/16/2018 1420   CHOLHDL 5.3 (H) 06/16/2018 1420   VLDL 11 10/26/2016 0356   LDLCALC 226 (H) 06/16/2018 1420    HgbA1C: No results found for: HGBA1C  Urine Drug Screen:  No results found for: LABOPIA, COCAINSCRNUR, LABBENZ, AMPHETMU, THCU, LABBARB  Alcohol Level: No results for input(s): ETH in the last 168 hours.  Other results: EKG: normal sinus rhythm at 67 bpm.  Imaging: DG Chest 2 View  Result Date: 10/25/2019 CLINICAL DATA:  Cardiac palpitations EXAM: CHEST - 2 VIEW COMPARISON:  10/11/2019 FINDINGS: Cardiac shadow is stable. Aortic calcifications are again seen. Elevation of left hemidiaphragm is again noted. No bony abnormality is seen. IMPRESSION: No acute abnormality seen. Electronically Signed   By: Inez Catalina M.D.   On: 10/25/2019 23:44   CT Head Wo Contrast  Result Date: 10/26/2019 CLINICAL DATA:  Left leg and arm numbness EXAM: CT HEAD WITHOUT CONTRAST TECHNIQUE: Contiguous axial images were obtained from the base of the skull through the vertex without intravenous contrast. COMPARISON:  04/13/2013 FINDINGS: Brain: Mild atrophic changes are noted commensurate with the patient's given age. No findings to suggest acute hemorrhage, acute infarction or space-occupying mass are noted. Small lacunar infarct is noted within the left thalamus. Vascular: No hyperdense vessel or unexpected calcification. Skull: Normal. Negative for fracture or focal lesion. Sinuses/Orbits: No acute finding.  Other: None. IMPRESSION: Chronic atrophic and ischemic changes without acute abnormality. Electronically Signed   By: Inez Catalina M.D.   On: 10/26/2019 02:38   MR BRAIN WO CONTRAST  Result Date: 10/26/2019 CLINICAL DATA:  74 year old female who woke from a nap at 1400 hours with left upper extremity weakness. EXAM: MRI HEAD WITHOUT CONTRAST TECHNIQUE: Multiplanar, multiecho pulse sequences of the brain and surrounding structures were obtained without intravenous contrast. COMPARISON:  Head CT earlier this morning. FINDINGS: Brain: There is a small 6 millimeter focus of restricted diffusion in the body of the right caudate nucleus (series 2, image 27 and series 4, image 18). Mild if any associated T2 and FLAIR hyperintensity. No associated hemorrhage or mass effect. Superimposed chronic hemorrhagic lacunar infarct of the right posterior lentiform which is nearby. No other restricted diffusion. No other chronic cerebral blood products identified. No midline shift, mass effect, evidence of mass lesion, ventriculomegaly, extra-axial collection or acute  intracranial hemorrhage. Cervicomedullary junction and pituitary are within normal limits. Patchy and scattered bilateral cerebral white matter T2 and FLAIR hyperintensity. Moderate similar patchy T2 and FLAIR hyperintensity in the pons. 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 Negative cerebellum. No definite chronic cortical encephalomalacia. Bulky dural calcifications incidentally noted. Vascular: Major intracranial vascular flow voids are preserved. Skull and upper cervical spine: Negative visible cervical spine. Visualized bone marrow signal is within normal limits. Sinuses/Orbits: Negative orbits. Paranasal sinuses and mastoids are stable and well pneumatized. Other: Grossly negative visible internal auditory structures. Scalp and face soft tissues appear negative. IMPRESSION: 1. 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 No associated hemorrhage or mass effect. 2. Underlying chronic small vessel disease with previous bilateral deep gray nuclei lacunar infarcts (chronic hemosiderin associated in the right lentiform), patchy bilateral cerebral white matter and pontine signal changes. Electronically Signed   By: Genevie Ann M.D.   On: 10/26/2019 06:49    Assessment: 74 y.o. female with a history of AAA, anemia, GERD, hypertension, hypothyroidism, depression, obesity, BPPV, CKD stage III, hyperlipidemia CAD, polymyalgia rheumatica presenting after recurrent episodes of left sided numbness and weakness. Now back to baseline.  MRI of the brain personally reviewed and reveals an acute lacunar infarct in the right caudate and chronic small vessel ischemic changes.  Etiology likely small vessel disease.  BP elevated.  Patient on ASA prior to admission.  Further work up recommended.   Stroke Risk Factors - hyperlipidemia and hypertension  Plan: 1. HgbA1c, fasting lipid panel 2. Frequent neuro checks 3. PT consult, OT consult, Speech consult 4. Echocardiogram 5. Carotid dopplers 6. Prophylactic therapy-Dual antiplatelet therapy with ASA 81mg  and Plavix 75mg  for three weeks with change to Plavix 75mg  daily alone as monotherapy after that time. 7. NPO until RN stroke swallow screen 8. Telemetry monitoring 9. Liberal BP management at this time.  Would work to achieve normotension after 24-48 hours   Alexis Goodell, MD Neurology [phone] 10/26/2019, 10:17 AM

## 2019-10-26 NOTE — ED Notes (Signed)
Pt transported to CT ?

## 2019-10-26 NOTE — ED Notes (Signed)
Pt eating lunch at bedside.

## 2019-10-26 NOTE — ED Notes (Addendum)
ED provider at bedside.

## 2019-10-26 NOTE — H&P (Signed)
[facility] History and Physical  Betty Nielsen [id number] DOB: 05/18/1946 DOA: 10/26/2019   PCP: Steele Sizer, MD   Chief Complaint: left arm and leg  Numbness, numbness  HPI: Betty Nielsen is a 74 y.o. female  History of AAA, anemia, GERD, hypertension, hypothyroidism, depression, obesity, BPPV, CKD stage III, hyperlipidemia CAD, polymyalgia rheumatica presented with Arm weakness, numbness  Patient Is not a good historian, per Patient and chart review, patient last known normal was yesterday morning, when she was in the kitchen, she suddenly developed left-sided numbness and weakness, patient states when she attempts to walk, she was dragging her left leg, her symptoms fully resolved in around 1 hour.  However today around 2 AM, her symptoms reoccurred, she had numbness feeling on her left side, she went to the ED.  Patient reports no headache, blurred vision, dizziness, fever, chills, chest pain, palpitation, shortness of breath, cough, abdominal pain,  nausea, vomiting, diarrhea, dysuria, leg swelling.  In ED patient was found, temperature 98.2, heart rate 69, respiratory rate 40, blood pressure ranged from 172/98, now 213/100, oxygen saturation 97% on room air episodes sodium 133, glucose 101, creatinine 1.67 and baseline, troponin VII, unremarkable stable CBC except hemoglobin 10.6, CT head:Chronic atrophic and ischemic changes without acute abnormality. Chest x-ray: no Acute abnormality  MRI: 1. Small acute lacunar infarct in the right caudate nucleus. No associated hemorrhage or mass effect.  2. Underlying chronic small vessel disease with previous bilateral deep gray nuclei lacunar infarcts (chronic hemosiderin associated in the right lentiform), patchy bilateral cerebral white matter and pontine signal changes.  In the ED, patient was given Maalox, clonidine, labetalol, nitroglycerin Hospitalist was requested to admit patient   Review of Systems:  All others  reviewed and are negative, except stated in HPI   Past Medical History:  Diagnosis Date  . Arthrosis of knee   . Chronic kidney disease   . Hyperlipidemia   . Hypertension   . Loss of hearing   . Metabolic syndrome   . Plantar fasciitis   . Renal insufficiency    Past Surgical History:  Procedure Laterality Date  . ABDOMINAL AORTIC ANEURYSM REPAIR    . ABDOMINAL HYSTERECTOMY    . APPENDECTOMY    . CORONARY STENT INTERVENTION N/A 10/26/2016   Procedure: Coronary Stent Intervention;  Surgeon: Yolonda Kida, MD;  Location: [facility];  Service: Cardiovascular;  Laterality: N/A;  . HERNIA REPAIR    . INNER EAR SURGERY     pt not sure of type  . LEFT HEART CATH AND CORONARY ANGIOGRAPHY N/A 10/26/2016   Procedure: Left Heart Cath and Coronary Angiography;  Surgeon: Teodoro Spray, MD;  Location: [facility];  Service: Cardiovascular;  Laterality: N/A;  . THYROIDECTOMY     Social History:  reports that she quit smoking about 21 years ago. Her smoking use included cigarettes. She has a 10.00 pack-year smoking history. She has never used smokeless tobacco. She reports that she does not drink alcohol or use drugs.  Allergies  Allergen Reactions  . Ace Inhibitors Cough  . Ciprofloxacin Other (See Comments)    unknown  . Citalopram     hyponatremia  . Hydrocodone-Acetaminophen Other (See Comments)    unknown  . Norvasc [Amlodipine Besylate]     Pruritus   . Pantoprazole Sodium Other (See Comments)    Cramps  . Pravastatin Other (See Comments)    Stomach cramps  . Sulfa Antibiotics     unknown  Family History  Problem Relation Age of Onset  . Healthy Mother   . Cerebral aneurysm Father     Prior to Admission medications   Medication Sig Start Date End Date Taking? Authorizing Provider  acetaminophen (TYLENOL) 500 MG tablet Take 500 mg by mouth every 8 (eight) hours as needed (body aches).     [provider]  aspirin 81 MG tablet Take 81 mg  by mouth daily.    [provider]  cetirizine (ZYRTEC) 10 MG tablet Take 1 tablet (10 mg total) by mouth daily. Patient taking differently: Take 10 mg by mouth daily. PRN only 11/01/17   Steele Sizer, MD  Cholecalciferol (VITAMIN D-1000 MAX ST) 1000 units tablet Take by mouth.    [provider]  cloNIDine (CATAPRES) 0.2 MG tablet Take 0.2 mg by mouth 2 (two) times daily.    Kolluru, Sarath, MD  irbesartan (AVAPRO) 300 MG tablet Take 1 tablet (300 mg total) by mouth daily. 12/14/16   Steele Sizer, MD  labetalol (NORMODYNE) 100 MG tablet Take 1 tablet by mouth 2 (two) times daily.  10/27/17   Callwood, Karma Greaser D, MD  levothyroxine (SYNTHROID) 75 MCG tablet Take 1 tablet (75 mcg total) by mouth daily before breakfast. 06/20/19   Ancil Boozer, Drue Stager, MD  nitroGLYCERIN (NITROSTAT) 0.4 MG SL tablet Place 1 tablet (0.4 mg total) under the tongue every 5 (five) minutes as needed for chest pain. 10/27/16   Theodoro Grist, MD  Omega-3 Fatty Acids (FISH OIL) 1000 MG CPDR Take 2 capsules by mouth daily.    [provider]  sucralfate (CARAFATE) 1 g tablet Take 1 tablet (1 g total) by mouth 4 (four) times daily -  with meals and at bedtime for 15 days. 10/11/19 10/26/19  Lavonia Drafts, MD   Physical Exam: Vitals:   10/26/19 0430 10/26/19 0500 10/26/19 0530 10/26/19 0630  BP: (!) 209/97 (!) 172/98 (!) 204/99 (!) 213/100  Pulse: 65 70 72 69  Resp:    14  Temp:      SpO2: 98% 95% 97% 97%  Weight:        Wt Readings from Last 3 Encounters:  10/25/19 84.4 kg  06/07/19 84.4 kg  09/12/18 82.1 kg    General:  Appears calm and comfortable Eyes: PERRL, normal lids, irises & conjunctiva ENT: grossly normal hearing, lips & tongue Neck: no LAD, masses or thyromegaly Cardiovascular: RRR, no m/r/g. No LE edema. Telemetry: SR, no arrhythmias  Respiratory: CTA bilaterally, no w/r/r. Normal respiratory effort. Abdomen: soft, ntnd Skin: no rash or induration seen on limited  exam Musculoskeletal: grossly normal tone BUE/BLE Psychiatric: grossly normal mood and affect, speech fluent and appropriate Neurologic: grossly non-focal.          Labs on Admission:  Basic Metabolic Panel: Recent Labs  Lab 10/25/19 2317  NA 133*  K 3.9  CL 101  CO2 25  GLUCOSE 101*  BUN 17  CREATININE 1.67*  CALCIUM 9.2   Liver Function Tests: No results for input(s): AST, ALT, ALKPHOS, BILITOT, PROT, ALBUMIN in the last 168 hours. No results for input(s): LIPASE, AMYLASE in the last 168 hours. No results for input(s): AMMONIA in the last 168 hours. CBC: Recent Labs  Lab 10/25/19 2317  WBC 6.7  HGB 10.6*  HCT 32.0*  MCV 91.2  PLT 269   Cardiac Enzymes: No results for input(s): CKTOTAL, CKMB, CKMBINDEX, TROPONINI in the last 168 hours.  BNP (last 3 results) No results for input(s): BNP in the last  8760 hours.  ProBNP (last 3 results) No results for input(s): PROBNP in the last 8760 hours.  CBG: No results for input(s): GLUCAP in the last 168 hours.  Radiological Exams on Admission: DG Chest 2 View  Result Date: 10/25/2019 CLINICAL DATA:  Cardiac palpitations EXAM: CHEST - 2 VIEW COMPARISON:  10/11/2019 FINDINGS: Cardiac shadow is stable. Aortic calcifications are again seen. Elevation of left hemidiaphragm is again noted. No bony abnormality is seen. IMPRESSION: No acute abnormality seen. Electronically Signed   By: Inez Catalina M.D.   On: 10/25/2019 23:44   CT Head Wo Contrast  Result Date: 10/26/2019 CLINICAL DATA:  Left leg and arm numbness EXAM: CT HEAD WITHOUT CONTRAST TECHNIQUE: Contiguous axial images were obtained from the base of the skull through the vertex without intravenous contrast. COMPARISON:  04/13/2013 FINDINGS: Brain: Mild atrophic changes are noted commensurate with the patient's given age. No findings to suggest acute hemorrhage, acute infarction or space-occupying mass are noted. Small lacunar infarct is noted within the left thalamus.  Vascular: No hyperdense vessel or unexpected calcification. Skull: Normal. Negative for fracture or focal lesion. Sinuses/Orbits: No acute finding. Other: None. IMPRESSION: Chronic atrophic and ischemic changes without acute abnormality. Electronically Signed   By: Inez Catalina M.D.   On: 10/26/2019 02:38   MR BRAIN WO CONTRAST  Result Date: 10/26/2019 CLINICAL DATA:  74 year old female who woke from a nap at 1400 hours with left upper extremity weakness. EXAM: MRI HEAD WITHOUT CONTRAST TECHNIQUE: Multiplanar, multiecho pulse sequences of the brain and surrounding structures were obtained without intravenous contrast. COMPARISON:  Head CT earlier this morning. FINDINGS: Brain: There is a small 6 millimeter focus of restricted diffusion in the body of the right caudate nucleus (series 2, image 27 and series 4, image 18). Mild if any associated T2 and FLAIR hyperintensity. No associated hemorrhage or mass effect. Superimposed chronic hemorrhagic lacunar infarct of the right posterior lentiform which is nearby. No other restricted diffusion. No other chronic cerebral blood products identified. No midline shift, mass effect, evidence of mass lesion, ventriculomegaly, extra-axial collection or acute intracranial hemorrhage. Cervicomedullary junction and pituitary are within normal limits. Patchy and scattered bilateral cerebral white matter T2 and FLAIR hyperintensity. Moderate similar patchy T2 and FLAIR hyperintensity in the pons. 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 Negative cerebellum. No definite chronic cortical encephalomalacia. Bulky dural calcifications incidentally noted. Vascular: Major intracranial vascular flow voids are preserved. Skull and upper cervical spine: Negative visible cervical spine. Visualized bone marrow signal is within normal limits. Sinuses/Orbits: Negative orbits. Paranasal sinuses and mastoids are stable and well pneumatized. Other: Grossly negative  visible internal auditory structures. Scalp and face soft tissues appear negative. IMPRESSION: 1. Small acute lacunar infarct in the right caudate nucleus. No associated hemorrhage or mass effect. 2. Underlying chronic small vessel disease with previous bilateral deep gray nuclei lacunar infarcts (chronic hemosiderin associated in the right lentiform), patchy bilateral cerebral white matter and pontine signal changes. Electronically Signed   By: Genevie Ann M.D.   On: 10/26/2019 06:49    EKG: Independently reviewed.  Sinus rhythm, no significant acute ischemic change  Assessment/Plan Principal Problem:   CVA (cerebral vascular accident) ([place]) Active Problems:   Hyponatremia   Hypertensive urgency    stroke: Presented with side weakness, now resolved patient also has multiple risk factors for stroke including uncontrolled HTN, HLD  Not candidate for tPA given out of window and symptoms resolving Head CT: No acute intracranial abnormality. MRI shows  small acute lacunar infarct in the right caudate nucleus Carotid ultrasound pending Echo pending HbA1c and fasting lipid panel pending Tele monitoring Neuro check Q4h Aspiration and Fall Precautions. Bedside swallow screen and advance diet as tolerated if no aspiration. PT/OT/ST consultations. Aspirin and statin Permissive hypertension, but given history of AAA, will continue clonidine and labetalol, hold irbesartan, hydralazine as needed  neurology consult pending, appreciate recommendations  #Hypertensive urgency: Will  permissive hypertension, see above   #Hyponatremia: Mild, likely chronic, no symptoms.  Close monitoring   #History of AAA, anemia, GERD, hypertension, hypothyroidism, depression, obesity, BPPV, CKD stage III, hyperlipidemia CAD, polymyalgia rheumatica Established Active problems see above Continue home medications if appropriate follow-up with his PCP, specialist when necessary   Code Status: Full (must indicate  code status--if unknown or must be presumed, indicate so) DVT Prophylaxis: heparin Family Communication: No family at bedside (indicate person spoken with, if applicable, with phone number if by telephone) Disposition Plan: Observation (indicate anticipated LOS)  Time spent: [facility]

## 2019-10-26 NOTE — ED Notes (Signed)
Dr Niu at bedside 

## 2019-10-26 NOTE — ED Notes (Signed)
Pt transported to MRI 

## 2019-10-26 NOTE — ED Notes (Signed)
Informed pt that a diet order had been placed and I had called for the pt to get a lunch tray.

## 2019-10-26 NOTE — ED Notes (Addendum)
Provider at bedside

## 2019-10-27 DIAGNOSIS — N183 Chronic kidney disease, stage 3 unspecified: Secondary | ICD-10-CM | POA: Diagnosis present

## 2019-10-27 DIAGNOSIS — R29700 NIHSS score 0: Secondary | ICD-10-CM | POA: Diagnosis present

## 2019-10-27 DIAGNOSIS — H9190 Unspecified hearing loss, unspecified ear: Secondary | ICD-10-CM | POA: Diagnosis present

## 2019-10-27 DIAGNOSIS — Z87891 Personal history of nicotine dependence: Secondary | ICD-10-CM | POA: Diagnosis not present

## 2019-10-27 DIAGNOSIS — M353 Polymyalgia rheumatica: Secondary | ICD-10-CM | POA: Diagnosis present

## 2019-10-27 DIAGNOSIS — F329 Major depressive disorder, single episode, unspecified: Secondary | ICD-10-CM | POA: Diagnosis present

## 2019-10-27 DIAGNOSIS — E871 Hypo-osmolality and hyponatremia: Secondary | ICD-10-CM | POA: Diagnosis present

## 2019-10-27 DIAGNOSIS — H8110 Benign paroxysmal vertigo, unspecified ear: Secondary | ICD-10-CM | POA: Diagnosis present

## 2019-10-27 DIAGNOSIS — Z8489 Family history of other specified conditions: Secondary | ICD-10-CM | POA: Diagnosis not present

## 2019-10-27 DIAGNOSIS — Z8673 Personal history of transient ischemic attack (TIA), and cerebral infarction without residual deficits: Secondary | ICD-10-CM | POA: Diagnosis present

## 2019-10-27 DIAGNOSIS — I639 Cerebral infarction, unspecified: Secondary | ICD-10-CM | POA: Diagnosis present

## 2019-10-27 DIAGNOSIS — E669 Obesity, unspecified: Secondary | ICD-10-CM | POA: Diagnosis present

## 2019-10-27 DIAGNOSIS — Z8679 Personal history of other diseases of the circulatory system: Secondary | ICD-10-CM | POA: Diagnosis not present

## 2019-10-27 DIAGNOSIS — I2510 Atherosclerotic heart disease of native coronary artery without angina pectoris: Secondary | ICD-10-CM | POA: Diagnosis present

## 2019-10-27 DIAGNOSIS — E890 Postprocedural hypothyroidism: Secondary | ICD-10-CM | POA: Diagnosis present

## 2019-10-27 DIAGNOSIS — M1710 Unilateral primary osteoarthritis, unspecified knee: Secondary | ICD-10-CM | POA: Diagnosis present

## 2019-10-27 DIAGNOSIS — I6381 Other cerebral infarction due to occlusion or stenosis of small artery: Secondary | ICD-10-CM | POA: Diagnosis present

## 2019-10-27 DIAGNOSIS — E785 Hyperlipidemia, unspecified: Secondary | ICD-10-CM | POA: Diagnosis present

## 2019-10-27 DIAGNOSIS — Z882 Allergy status to sulfonamides status: Secondary | ICD-10-CM | POA: Diagnosis not present

## 2019-10-27 DIAGNOSIS — I129 Hypertensive chronic kidney disease with stage 1 through stage 4 chronic kidney disease, or unspecified chronic kidney disease: Secondary | ICD-10-CM | POA: Diagnosis present

## 2019-10-27 DIAGNOSIS — Z888 Allergy status to other drugs, medicaments and biological substances status: Secondary | ICD-10-CM | POA: Diagnosis not present

## 2019-10-27 DIAGNOSIS — K219 Gastro-esophageal reflux disease without esophagitis: Secondary | ICD-10-CM | POA: Diagnosis present

## 2019-10-27 DIAGNOSIS — Z20822 Contact with and (suspected) exposure to covid-19: Secondary | ICD-10-CM | POA: Diagnosis present

## 2019-10-27 DIAGNOSIS — Z7982 Long term (current) use of aspirin: Secondary | ICD-10-CM | POA: Diagnosis not present

## 2019-10-27 DIAGNOSIS — I160 Hypertensive urgency: Secondary | ICD-10-CM | POA: Diagnosis present

## 2019-10-27 DIAGNOSIS — Z79890 Hormone replacement therapy (postmenopausal): Secondary | ICD-10-CM | POA: Diagnosis not present

## 2019-10-27 LAB — LIPID PANEL
Cholesterol: 361 mg/dL — ABNORMAL HIGH (ref 0–200)
HDL: 53 mg/dL (ref >40)
LDL Cholesterol: 279 mg/dL — ABNORMAL HIGH (ref 0–99)
Total CHOL/HDL Ratio: 6.8 RATIO
Triglycerides: 143 mg/dL (ref <150)
VLDL: 29 mg/dL (ref 0–40)

## 2019-10-27 LAB — SARS CORONAVIRUS 2 (TAT 6-24 HRS): SARS Coronavirus 2: NEGATIVE

## 2019-10-27 LAB — HEMOGLOBIN A1C
Hgb A1c MFr Bld: 5.8 % — ABNORMAL HIGH (ref 4.8–5.6)
Mean Plasma Glucose: 119.76 mg/dL

## 2019-10-27 MED ORDER — DOCUSATE SODIUM 100 MG PO CAPS: 100.0000 mg | ORAL_CAPSULE | Freq: Two times a day (BID) | ORAL | Status: DC | PRN

## 2019-10-27 MED ORDER — CLOPIDOGREL BISULFATE 75 MG PO TABS
75.0000 mg | ORAL_TABLET | Freq: Every day | ORAL | Status: DC
  Administered 2019-10-27 – 2019-10-28 (×2): 75 mg via ORAL
  Filled 2019-10-27 (×2): qty 1

## 2019-10-27 MED ORDER — ATORVASTATIN CALCIUM 20 MG PO TABS
40.0000 mg | ORAL_TABLET | Freq: Every day | ORAL | Status: DC
  Administered 2019-10-27: 40 mg via ORAL
  Filled 2019-10-27 (×2): qty 2

## 2019-10-27 MED ORDER — ALUM & MAG HYDROXIDE-SIMETH 200-200-20 MG/5ML PO SUSP: 15.0000 mL | Freq: Four times a day (QID) | ORAL | Status: DC | PRN

## 2019-10-27 MED ORDER — ONDANSETRON 4 MG PO TBDP
4.0000 mg | ORAL_TABLET | Freq: Three times a day (TID) | ORAL | Status: DC | PRN
  Filled 2019-10-27: qty 1

## 2019-10-27 MED ORDER — ONDANSETRON HCL 4 MG/2ML IJ SOLN
4.0000 mg | Freq: Four times a day (QID) | INTRAMUSCULAR | Status: DC | PRN
  Filled 2019-10-27: qty 2

## 2019-10-27 MED ORDER — CALCIUM CARBONATE ANTACID 500 MG PO CHEW: 1.0000 | CHEWABLE_TABLET | Freq: Three times a day (TID) | ORAL | Status: DC | PRN

## 2019-10-27 MED ORDER — GUAIFENESIN-DM 100-10 MG/5ML PO SYRP: 10.0000 mL | ORAL_SOLUTION | Freq: Four times a day (QID) | ORAL | Status: DC | PRN

## 2019-10-27 NOTE — Progress Notes (Signed)
PROGRESS NOTE    Betty Nielsen  [id number] DOB: [birth year] DOA: 10/26/2019 PCP: Betty Sizer, MD    Assessment & Plan:   Principal Problem:   CVA (cerebral vascular accident) [facility]) Active Problems:   Hyponatremia   Hypertensive urgency   Stroke (cerebrum) (HCC)    Betty Nielsen is a 74 y.o. AA female  History of AAA, anemia, GERD, hypertension, hypothyroidism, depression, obesity, BPPV, CKD stage III, hyperlipidemia CAD, polymyalgia rheumatica who presented with Left-sided arm weakness, numbness   # acute lacunar infarct in the right caudate  --likely 2/2 small vessel disease.  BP elevated. Patient on ASA prior to admission. Carotid dopplers showed no evidence of hemodynamically significant stenosis.  A1c 5.8, LDL 279. PLAN: --Start lipitor 40 mg daily --Prophylactic therapy-Dual antiplatelet therapy with ASA 81mg  and Plavix 75mg  for three weeks with change toPlavix 75mg  dailyalone as monotherapy after that time, per neuro --Liberal BP management at this time. Would work to achieve normotension after 24-48 hours --PT/OT/SLP  #Hypertensive urgency:  --permissive hypertension, see above  #Hyponatremia: Mild, likely chronic, no symptoms.  Close monitoring   #History of AAA, anemia, GERD, hypertension, hypothyroidism, depression, obesity, BPPV, CKD stage III, hyperlipidemia CAD, polymyalgia rheumatica Established Active problems see above Continue home medications if appropriate follow-up with his PCP, specialist when necessary   DVT prophylaxis: Heparin SQ Code Status: Full code  Family Communication: not today Disposition Plan: 1-2 days   Subjective and Interval History:  Pt reported no new change.  No fever, dyspnea, chest pain, abdominal pain, N/V/D, dysuria, increased swelling.   Objective: Vitals:   10/27/19 2230 10/27/19 2341 10/28/19 0425 10/28/19 0739  BP: (!) 208/72 (!) 160/79 (!) 158/86 (!) 191/91  Pulse: 62 71 67 (!) 58  Resp:  18 18  18   Temp:  98.8 F (37.1 C) 98.8 F (37.1 C) 98.5 F (36.9 C)  TempSrc:    Oral  SpO2:  100% 100% 99%  Weight:       No intake or output data in the 24 hours ending 10/28/19 0856 Filed Weights   10/25/19 2301 10/26/19 1808  Weight: 84.4 kg 85.4 kg    Examination:   Constitutional: NAD, AAOx3, some dysarthria HEENT: conjunctivae and lids normal, EOMI CV: RRR no M,R,G. Distal pulses +2.  No cyanosis.   RESP: CTA B/L, normal respiratory effort  GI: +BS, NTND Extremities: No effusions, edema, or tenderness in BLE SKIN: warm, dry and intact Neuro: II - XII grossly intact.  Sensation intact Psych: Normal mood and affect.  Appropriate judgement and reason   Data Reviewed: I have personally reviewed following labs and imaging studies  CBC: Recent Labs  Lab 10/25/19 2317  WBC 6.7  HGB 10.6*  HCT 32.0*  MCV 91.2  PLT [id number]   Basic Metabolic Panel: Recent Labs  Lab 10/25/19 2317  NA 133*  K 3.9  CL 101  CO2 25  GLUCOSE 101*  BUN 17  CREATININE 1.67*  CALCIUM 9.2   GFR: Estimated Creatinine Clearance: 31.1 mL/min (A) (by C-G formula based on SCr of 1.67 mg/dL (H)). Liver Function Tests: No results for input(s): AST, ALT, ALKPHOS, BILITOT, PROT, ALBUMIN in the last 168 hours. No results for input(s): LIPASE, AMYLASE in the last 168 hours. No results for input(s): AMMONIA in the last 168 hours. Coagulation Profile: No results for input(s): INR, PROTIME in the last 168 hours. Cardiac Enzymes: No results for input(s): CKTOTAL, CKMB, CKMBINDEX, TROPONINI in the last 168 hours. BNP (last 3 results) No  results for input(s): PROBNP in the last 8760 hours. HbA1C: Recent Labs    10/27/19 0522  HGBA1C 5.8*   CBG: No results for input(s): GLUCAP in the last 168 hours. Lipid Profile: Recent Labs    10/27/19 0522  CHOL 361*  HDL 53  LDLCALC 279*  TRIG 143  CHOLHDL 6.8   Thyroid Function Tests: No results for input(s): TSH, T4TOTAL, FREET4, T3FREE, THYROIDAB in  the last 72 hours. Anemia Panel: No results for input(s): VITAMINB12, FOLATE, FERRITIN, TIBC, IRON, RETICCTPCT in the last 72 hours. Sepsis Labs: No results for input(s): PROCALCITON, LATICACIDVEN in the last 168 hours.  Recent Results (from the past 240 hour(s))  SARS CORONAVIRUS 2 (TAT 6-24 HRS) Nasopharyngeal Nasopharyngeal Swab     Status: None   Collection Time: 10/27/19  3:38 AM   Specimen: Nasopharyngeal Swab  Result Value Ref Range Status   SARS Coronavirus 2 NEGATIVE NEGATIVE Final    Comment: (NOTE) SARS-CoV-2 target nucleic acids are NOT DETECTED. The SARS-CoV-2 RNA is generally detectable in upper and lower respiratory specimens during the acute phase of infection. Negative results do not preclude SARS-CoV-2 infection, do not rule out co-infections with other pathogens, and should not be used as the sole basis for treatment or other patient management decisions. Negative results must be combined with clinical observations, patient history, and epidemiological information. The expected result is Negative. Fact Sheet for Patients: SugarRoll.be Fact Sheet for Healthcare Providers: https://www.woods-mathews.com/ This test is not yet approved or cleared by the [place] FDA and  has been authorized for detection and/or diagnosis of SARS-CoV-2 by FDA under an Emergency Use Authorization (EUA). This EUA will remain  in effect (meaning this test can be used) for the duration of the COVID-19 declaration under Section 56 4(b)(1) of the Act, 21 U.S.C. section 360bbb-3(b)(1), unless the authorization is terminated or revoked sooner. Performed at [facility], [place] [address] [place], [place] [address]       Radiology Studies: US Carotid Bilateral (at [facility] and [facility] only)  Result Date: 10/27/2019 CLINICAL DATA:  74 year old female with a history of CVA EXAM: BILATERAL CAROTID DUPLEX ULTRASOUND TECHNIQUE: Pearline Cables scale imaging, color  Doppler and duplex ultrasound were performed of bilateral carotid and vertebral arteries in the neck. COMPARISON:  None. FINDINGS: Criteria: Quantification of carotid stenosis is based on velocity parameters that correlate the residual internal carotid diameter with NASCET-based stenosis levels, using the diameter of the distal internal carotid lumen as the denominator for stenosis measurement. The following velocity measurements were obtained: RIGHT ICA:  Systolic 91 cm/sec, Diastolic 32 cm/sec CCA:  61 cm/sec SYSTOLIC ICA/CCA RATIO:  1.5 ECA:  72 cm/sec LEFT ICA:  Systolic 99991111 cm/sec, Diastolic 26 cm/sec CCA:  67 cm/sec SYSTOLIC ICA/CCA RATIO:  1.5 ECA:  67 cm/sec Right Brachial SBP: Not acquired Left Brachial SBP: Not acquired RIGHT CAROTID ARTERY: No significant calcifications of the right common carotid artery. Intermediate waveform maintained. Heterogeneous and partially calcified plaque at the right carotid bifurcation. Mild lumen shadowing. Low resistance waveform of the right ICA. No significant tortuosity. RIGHT VERTEBRAL ARTERY: Antegrade flow with low resistance waveform. LEFT CAROTID ARTERY: No significant calcifications of the left common carotid artery. Intermediate waveform maintained. Heterogeneous and partially calcified plaque at the left carotid bifurcation with mild lumen shadowing. Low resistance waveform of the left ICA. No significant tortuosity. LEFT VERTEBRAL ARTERY:  Antegrade flow with low resistance waveform. IMPRESSION: Color duplex indicates minimal heterogeneous and calcified plaque, with no hemodynamically significant stenosis by duplex  criteria in the extracranial cerebrovascular circulation. Signed, Dulcy Fanny. Dellia Nims, RPVI [facility] Radiology Electronically Signed   By: Corrie Mckusick D.O.   On: 10/27/2019 08:10     Scheduled Meds: . aspirin EC  81 mg Oral Daily  . atorvastatin  40 mg Oral q1800  . cholecalciferol  1,000  Units Oral Daily  . cloNIDine  0.2 mg Oral BID  . clopidogrel  75 mg Oral Daily  . heparin  5,000 Units Subcutaneous Q8H  . labetalol  100 mg Oral BID  . levothyroxine  75 mcg Oral QAC breakfast  . omega-3 acid ethyl esters  1 g Oral Daily  . polyethylene glycol  17 g Oral Daily  . sucralfate  1 g Oral TID WC & HS   Continuous Infusions:   LOS: 1 day     Enzo Bi, MD [facility] If 7PM-7AM, please contact night-coverage 10/28/2019, 8:56 AM

## 2019-10-27 NOTE — Evaluation (Signed)
Occupational Therapy Evaluation Patient Details Name: Betty Nielsen MRN: [id number] DOB: 1946/08/31 Today's Date: 10/27/2019    History of Present Illness 74 y.o. female with a history of AAA, anemia, GERD, hypertension, hypothyroidism, depression, obesity, BPPV, CKD stage III, hyperlipidemia CAD, polymyalgia rheumatica presenting after recurrent episodes of left sided numbness and weakness. Now back to baseline.  MRI of the brain reveals an acute lacunar infarct in the right caudate and chronic small vessel ischemic changes.  Etiology likely small vessel disease.  BP elevated   Clinical Impression   Pt in bed upon arrival of OT - pt willing to participate with OT - did has some dry heaving - report taking her medication made her little sick. And she is constipated - but denies any pain. Pt show bilateral UE AROM and strength WNL including coordination test and observe in ADL's. Pt Independent with close supervision in functional mobility in room and to bathroom , no LOB - pt following directions and communicate back to baseline -no OT services indicated at this time.     Follow Up Recommendations  No OT follow up    Equipment Recommendations       Recommendations for Other Services       Precautions / Restrictions Precautions Precautions: Fall Restrictions Weight Bearing Restrictions: No      Mobility Bed Mobility                  Transfers                      Balance                                           ADL either performed or assessed with clinical judgement   ADL                                               Vision         Perception     Praxis      Pertinent Vitals/Pain       Hand Dominance Right   Extremity/Trunk Assessment             Communication Communication Communication: No difficulties   Cognition Arousal/Alertness: Awake/alert Behavior During Therapy: WFL for tasks  assessed/performed Overall Cognitive Status: Within Functional Limits for tasks assessed                                     General Comments       Exercises Other Exercises Other Exercises: Bilateral UE AROM and strength WNL, coordination WNL -and using hands functionally in ADL's WNL Other Exercises: Bedmobility Independent Other Exercises: Ambulate to bathroom close S - no LOB - turning around , clothing and hygiene management independent   Shoulder Instructions      Home Living Family/patient expects to be discharged to:: Private residence Living Arrangements: Alone Available Help at Discharge: Family Type of Home: House             Bathroom Shower/Tub: Teacher, early years/pre: Standard                Prior Functioning/Environment  Level of Independence: Independent                 OT Problem List:        OT Treatment/Interventions:      OT Goals(Current goals can be found in the care plan section) Acute Rehab OT Goals Patient Stated Goal: If I can have bowl movement, I'll feel better -and want to go home OT Goal Formulation: With patient Time For Goal Achievement: 11/03/19  OT Frequency:     Barriers to D/C:            Co-evaluation              AM-PAC OT "6 Clicks" Daily Activity     Outcome Measure                 End of Session Equipment Utilized During Treatment: Gait belt  Activity Tolerance: Patient tolerated treatment well Patient left: in bed;with call bell/phone within reach;with bed alarm set                   Time: [id number] OT Time Calculation (min): 20 min Charges:  OT General Charges $OT Visit: 1 Visit OT Evaluation $OT Eval Low Complexity: 1 Low    Moua Rasmusson OTR/L,CLT 10/27/2019, 1:26 PM

## 2019-10-27 NOTE — Progress Notes (Signed)
Subjective: Patient reports no new neurological complaints.  Remains at baseline neurologically  Objective: Current vital signs: BP (!) 178/78 (BP Location: Right Arm)   Pulse 71   Temp 99.8 F (37.7 C) (Oral)   Resp 18   Wt 85.4 kg   SpO2 97%   BMI 33.35 kg/m  Vital signs in last 24 hours: Temp:  [98.3 F (36.8 C)-99.8 F (37.7 C)] 99.8 F (37.7 C) (02/06 0829) Pulse Rate:  [56-83] 71 (02/06 0829) Resp:  [15-22] 18 (02/06 0829) BP: (147-206)/(67-109) 178/78 (02/06 0829) SpO2:  [95 %-99 %] 97 % (02/06 0829) Weight:  [85.4 kg] 85.4 kg (02/05 1808)  Intake/Output from previous day: No intake/output data recorded. Intake/Output this shift: No intake/output data recorded. Nutritional status:  Diet Order            Diet renal with fluid restriction Fluid restriction: 1200 mL Fluid; Room service appropriate? Yes; Fluid consistency: Thin  Diet effective now              Neurologic Exam: Mental Status: Alert, oriented, thought content appropriate.  Speech fluent without evidence of aphasia.  Able to follow 3 step commands without difficulty. Cranial Nerves: II: Discs flat bilaterally; Visual fields grossly normal, pupils equal, round, reactive to light and accommodation III,IV, VI: ptosis not present, extra-ocular motions intact bilaterally V,VII: smile symmetric, facial light touch sensation normal bilaterally VIII: hearing normal bilaterally IX,X: gag reflex present XI: bilateral shoulder shrug XII: midline tongue extension Motor: 5/5 throuhgout Sensory: Pinprick and light touch intact throughout, bilaterally  Lab Results: Basic Metabolic Panel: Recent Labs  Lab 10/25/19 2317  NA 133*  K 3.9  CL 101  CO2 25  GLUCOSE 101*  BUN 17  CREATININE 1.67*  CALCIUM 9.2    Liver Function Tests: No results for input(s): AST, ALT, ALKPHOS, BILITOT, PROT, ALBUMIN in the last 168 hours. No results for input(s): LIPASE, AMYLASE in the last 168 hours. No results for  input(s): AMMONIA in the last 168 hours.  CBC: Recent Labs  Lab 10/25/19 2317  WBC 6.7  HGB 10.6*  HCT 32.0*  MCV 91.2  PLT 269    Cardiac Enzymes: No results for input(s): CKTOTAL, CKMB, CKMBINDEX, TROPONINI in the last 168 hours.  Lipid Panel: Recent Labs  Lab 10/27/19 0522  CHOL 361*  TRIG 143  HDL 53  CHOLHDL 6.8  VLDL 29  LDLCALC 279*    CBG: No results for input(s): GLUCAP in the last 168 hours.  Microbiology: No results found for this or any previous visit.  Coagulation Studies: No results for input(s): LABPROT, INR in the last 72 hours.  Imaging: DG Chest 2 View  Result Date: 10/25/2019 CLINICAL DATA:  Cardiac palpitations EXAM: CHEST - 2 VIEW COMPARISON:  10/11/2019 FINDINGS: Cardiac shadow is stable. Aortic calcifications are again seen. Elevation of left hemidiaphragm is again noted. No bony abnormality is seen. IMPRESSION: No acute abnormality seen. Electronically Signed   By: Inez Catalina M.D.   On: 10/25/2019 23:44   CT Head Wo Contrast  Result Date: 10/26/2019 CLINICAL DATA:  Left leg and arm numbness EXAM: CT HEAD WITHOUT CONTRAST TECHNIQUE: Contiguous axial images were obtained from the base of the skull through the vertex without intravenous contrast. COMPARISON:  04/13/2013 FINDINGS: Brain: Mild atrophic changes are noted commensurate with the patient's given age. No findings to suggest acute hemorrhage, acute infarction or space-occupying mass are noted. Small lacunar infarct is noted within the left thalamus. Vascular: No hyperdense vessel or unexpected  calcification. Skull: Normal. Negative for fracture or focal lesion. Sinuses/Orbits: No acute finding. Other: None. IMPRESSION: Chronic atrophic and ischemic changes without acute abnormality. Electronically Signed   By: Inez Catalina M.D.   On: 10/26/2019 02:38   MR BRAIN WO CONTRAST  Result Date: 10/26/2019 CLINICAL DATA:  74 year old female who woke from a nap at 1400 hours with left upper extremity  weakness. EXAM: MRI HEAD WITHOUT CONTRAST TECHNIQUE: Multiplanar, multiecho pulse sequences of the brain and surrounding structures were obtained without intravenous contrast. COMPARISON:  Head CT earlier this morning. FINDINGS: Brain: There is a small 6 millimeter focus of restricted diffusion in the body of the right caudate nucleus (series 2, image 27 and series 4, image 18). Mild if any associated T2 and FLAIR hyperintensity. No associated hemorrhage or mass effect. Superimposed chronic hemorrhagic lacunar infarct of the right posterior lentiform which is nearby. No other restricted diffusion. No other chronic cerebral blood products identified. No midline shift, mass effect, evidence of mass lesion, ventriculomegaly, extra-axial collection or acute intracranial hemorrhage. Cervicomedullary junction and pituitary are within normal limits. Patchy and scattered bilateral cerebral white matter T2 and FLAIR hyperintensity. Moderate similar patchy T2 and FLAIR hyperintensity in the pons. 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 Negative cerebellum. No definite chronic cortical encephalomalacia. Bulky dural calcifications incidentally noted. Vascular: Major intracranial vascular flow voids are preserved. Skull and upper cervical spine: Negative visible cervical spine. Visualized bone marrow signal is within normal limits. Sinuses/Orbits: Negative orbits. Paranasal sinuses and mastoids are stable and well pneumatized. Other: Grossly negative visible internal auditory structures. Scalp and face soft tissues appear negative. IMPRESSION: 1. Small acute lacunar infarct in the right caudate nucleus. No associated hemorrhage or mass effect. 2. Underlying chronic small vessel disease with previous bilateral deep gray nuclei lacunar infarcts (chronic hemosiderin associated in the right lentiform), patchy bilateral cerebral white matter and pontine signal changes. Electronically Signed   By: Genevie Ann  M.D.   On: 10/26/2019 06:49   US Carotid Bilateral (at [facility] and [facility] only)  Result Date: 10/27/2019 CLINICAL DATA:  74 year old female with a history of CVA EXAM: BILATERAL CAROTID DUPLEX ULTRASOUND TECHNIQUE: Pearline Cables scale imaging, color Doppler and duplex ultrasound were performed of bilateral carotid and vertebral arteries in the neck. COMPARISON:  None. FINDINGS: Criteria: Quantification of carotid stenosis is based on velocity parameters that correlate the residual internal carotid diameter with NASCET-based stenosis levels, using the diameter of the distal internal carotid lumen as the denominator for stenosis measurement. The following velocity measurements were obtained: RIGHT ICA:  Systolic 91 cm/sec, Diastolic 32 cm/sec CCA:  61 cm/sec SYSTOLIC ICA/CCA RATIO:  1.5 ECA:  72 cm/sec LEFT ICA:  Systolic 99991111 cm/sec, Diastolic 26 cm/sec CCA:  67 cm/sec SYSTOLIC ICA/CCA RATIO:  1.5 ECA:  67 cm/sec Right Brachial SBP: Not acquired Left Brachial SBP: Not acquired RIGHT CAROTID ARTERY: No significant calcifications of the right common carotid artery. Intermediate waveform maintained. Heterogeneous and partially calcified plaque at the right carotid bifurcation. Mild lumen shadowing. Low resistance waveform of the right ICA. No significant tortuosity. RIGHT VERTEBRAL ARTERY: Antegrade flow with low resistance waveform. LEFT CAROTID ARTERY: No significant calcifications of the left common carotid artery. Intermediate waveform maintained. Heterogeneous and partially calcified plaque at the left carotid bifurcation with mild lumen shadowing. Low resistance waveform of the left ICA. No significant tortuosity. LEFT VERTEBRAL ARTERY:  Antegrade flow with low resistance waveform. IMPRESSION: Color duplex indicates minimal heterogeneous and calcified plaque, with no hemodynamically  significant stenosis by duplex criteria in the extracranial cerebrovascular circulation. Signed, Dulcy Fanny. Dellia Nims, RPVI [facility] Electronically Signed   By: Corrie Mckusick D.O.   On: 10/27/2019 08:10    Medications:  I have reviewed the patient's current medications. Scheduled: . aspirin EC  81 mg Oral Daily  . cholecalciferol  1,000 Units Oral Daily  . cloNIDine  0.2 mg Oral BID  . heparin  5,000 Units Subcutaneous Q8H  . labetalol  100 mg Oral BID  . levothyroxine  75 mcg Oral QAC breakfast  . omega-3 acid ethyl esters  1 g Oral Daily  . polyethylene glycol  17 g Oral Daily  . sucralfate  1 g Oral TID WC & HS    Assessment/Plan: 74 y.o. female with a history of AAA, anemia, GERD, hypertension, hypothyroidism, depression, obesity, BPPV, CKD stage III, hyperlipidemia CAD, polymyalgia rheumatica presenting after recurrent episodes of left sided numbness and weakness. Now back to baseline.  MRI of the brain personally reviewed and reveals an acute lacunar infarct in the right caudate and chronic small vessel ischemic changes.  Etiology likely small vessel disease.  BP elevated.  Patient on ASA prior to admission.   Carotid dopplers show no evidence of hemodynamically significant stenosis.  Echocardiogram pending.  A1c 5.8, LDL 279.  Stroke Risk Factors - hyperlipidemia and hypertension  Plan: 1. Statin for lipid management with target LDL<70. 2. Frequent neuro checks 3. PT consult, OT consult, Speech consult 4. Echocardiogram pending 5. Prophylactic therapy-Dual antiplatelet therapy with ASA 81mg  and Plavix 75mg  for three weeks with change to Plavix 75mg  daily alone as monotherapy after that time. 6. Telemetry monitoring 7. Liberal BP management at this time.  Would work to achieve normotension after 24-48 hours   LOS: 0 days   Alexis Goodell, MD Neurology [phone] 10/27/2019  9:37 AM

## 2019-10-27 NOTE — Evaluation (Signed)
Physical Therapy Evaluation Patient Details Name: Betty Nielsen MRN: [id number] DOB: 09/25/1945 Today's Date: 10/27/2019   History of Present Illness  74 y.o. female with a history of AAA, anemia, GERD, hypertension, hypothyroidism, depression, obesity, BPPV, CKD stage III, hyperlipidemia CAD, polymyalgia rheumatica presenting after recurrent episodes of left sided numbness and weakness. Now back to baseline.  MRI of the brain reveals an acute lacunar infarct in the right caudate and chronic small vessel ischemic changes.  Etiology likely small vessel disease.  BP elevated    Clinical Impression  Patient alert and oriented and able to provide a detailed history. Son present and able to coroborate history. Patient slightly limited by being hard of hearing. Prior to hospitalization, patient was I with all aspects of care and mobility and did not use an assistive device. She owns 2 canes. She lives in a 1 story home with 2 steps to enter and hand rails that can be reached on both sides at the same time. She has the potential for family support 24/7 if needed. Upon physical therapy evaluation, patient appears to be at baseline mobility and is able to ambulate in room with higher level activities (see below) without significant loss of balance. Patient appears to be functionally independent and not in need of further physical therapy services in the acute care setting and is therefore discharged from physical therapy at this time. Recommend daily ambulation 2-3x/day to prevent functional decline during the remainder of her hospital stay.     Follow Up Recommendations No PT follow up    Equipment Recommendations  None recommended by PT    Recommendations for Other Services       Precautions / Restrictions Precautions Precautions: None Restrictions Weight Bearing Restrictions: No      Mobility  Bed Mobility Overal bed mobility: Independent                Transfers Overall transfer  level: Independent               General transfer comment: completed sit <> stand from/to bed/chair with no AD independantly  Ambulation/Gait Ambulation/Gait assistance: Supervision;Min guard Gait Distance (Feet): 150 Feet Assistive device: None Gait Pattern/deviations: WFL(Within Functional Limits) Gait velocity: WFL   General Gait Details: Patient ambulated total of 150 feet back and forth in room with CGA- supervision for safety while completing various dynamic balance assessments (see below).  Stairs Stairs: (unable to practice stairs due to restricted to room per COVID19 testing precautions, but based on other physical performance, do not expect pt to have difficulty with steps at home)          Wheelchair Mobility    Modified Rankin (Stroke Patients Only) Modified Rankin (Stroke Patients Only) Pre-Morbid Rankin Score: No symptoms Modified Rankin: No significant disability     Balance Overall balance assessment: Independent                                   Dynamic Gait Index Level Surface: Normal Gait with Horizontal Head Turns: Normal Gait with Vertical Head Turns: Normal Gait and Pivot Turn: Mild Impairment Step Over Obstacle: Normal       Pertinent Vitals/Pain Pain Assessment: No/denies pain    Home Living Family/patient expects to be discharged to:: Private residence Living Arrangements: Alone Available Help at Discharge: Family;Available 24 hours/day Type of Home: House Home Access: Stairs to enter Entrance Stairs-Rails: Right;Left;Can reach both Entrance [employer]  of Steps: 2 Home Layout: One level Home Equipment: Cane - single point;Cane - quad      Prior Function Level of Independence: Independent               Hand Dominance   Dominant Hand: Right    Extremity/Trunk Assessment   Upper Extremity Assessment Upper Extremity Assessment: Overall WFL for tasks assessed    Lower Extremity Assessment Lower  Extremity Assessment: Overall WFL for tasks assessed    Cervical / Trunk Assessment Cervical / Trunk Assessment: Normal  Communication   Communication: No difficulties;HOH  Cognition Arousal/Alertness: Awake/alert Behavior During Therapy: WFL for tasks assessed/performed Overall Cognitive Status: Within Functional Limits for tasks assessed                                 General Comments: hard of hearing. forgets words at times      General Comments General comments (skin integrity, edema, etc.): Restricted to in room activities due to current COVID 19 precautions. Patient completed several activities from the Functional Gait Assessment with CGA for safety. Did stumble or reach for the wall a few times with tandem walking, and took very small steps with backwards walking, but appeared steady for usual walking patterns. 20 feet each: horizontal head turns, vertical head nods, backward walking (short steps), eyes closed (apprehensive), tandem stance (up to 11 steps in a row, reaching for support, able to catch self), step over object, stop and turn.    Exercises Other Exercises Other Exercises: education on safety of working with physical therapy. Importance of blood pressure control. Role of physical therapy in acute care setting. Prognosis, POC. Other Exercises: Bedmobility Independent Other Exercises: Ambulate to bathroom close S - no LOB - turning around , clothing and hygiene management independent   Assessment/Plan    PT Assessment Patent does not need any further PT services  PT Problem List         PT Treatment Interventions      PT Goals (Current goals can be found in the Care Plan section)  Acute Rehab PT Goals Patient Stated Goal: to go home PT Goal Formulation: With patient Time For Goal Achievement: 11/10/19 Potential to Achieve Goals: Good    Frequency     Barriers to discharge        Co-evaluation               AM-PAC PT "6 Clicks"  Mobility  Outcome Measure Help needed turning from your back to your side while in a flat bed without using bedrails?: None Help needed moving from lying on your back to sitting on the side of a flat bed without using bedrails?: None Help needed moving to and from a bed to a chair (including a wheelchair)?: None   Help needed to walk in hospital room?: None Help needed climbing 3-5 steps with a railing? : None 6 Click Score: 20    End of Session Equipment Utilized During Treatment: Gait belt Activity Tolerance: Patient tolerated treatment well Patient left: in chair;with family/visitor present;with call bell/phone within reach Nurse Communication: Mobility status PT Visit Diagnosis: Muscle weakness (generalized) (M62.81);Difficulty in walking, not elsewhere classified (R26.2);Hemiplegia and hemiparesis Hemiplegia - Right/Left: Left Hemiplegia - dominant/non-dominant: Non-dominant Hemiplegia - caused by: Cerebral infarction    Time: 1357-1430 PT Time Calculation (min) (ACUTE ONLY): 33 min   Charges:   PT Evaluation $PT Eval Low Complexity: 1 Low PT Treatments $  Gait Training: 8-22 mins        Everlean Alstrom. Graylon Good, PT, DPT 10/27/19, 2:51 PM

## 2019-10-27 NOTE — Progress Notes (Signed)
SLP Cancellation Note  Patient Details Name: Betty Nielsen MRN: [id number] DOB: 1946/04/23   Cancelled treatment:       Reason Eval/Treat Not Completed: SLP screened, no needs identified, will sign off(chart reviewed; consulted NSG then met w/ pt in room). Pt denied any difficulty swallowing and is currently on a regular diet; tolerates swallowing pills w/ water per NSG. She was feeding self her breakfast meal. Pt conversed in conversational w/ SLP w/out overt deficits noted; pt denied any new speech-language deficits. Pt is HOH and wears HAs which she placed. Pt did c/o constipation - NSG made aware per pt's request.   No further skilled ST services indicated as pt appears at her baseline. Pt agreed. NSG to reconsult if any change in status.     Orinda Kenner, [place], CCC-SLP Sheika Coutts 10/27/2019, 8:48 AM

## 2019-10-27 NOTE — Progress Notes (Signed)
Went to give patient her 6am medications, patient took her own medication Synthroid (was unaware she had her medications at the bedside), patient is refusing to give the home medication to me, Agricultural consultant is aware Eyvonne Mechanic) , per patient she will give her home medicatons to her son today. Patient refusing to give RN the medication to put away. Charge Rn spoke with patient, patient is still refusing to give medication to RN. Will notified day shift RN about medication at bedside.

## 2019-10-27 NOTE — Progress Notes (Signed)
Sent message via Secure chat to NP Rufina Falco notified that the patient has not be tested for COVID19, no orders were placed for the test. NP placed orders. Patient was tested currently pending results.

## 2019-10-27 NOTE — Plan of Care (Signed)
  Problem: Education: Goal: Knowledge of General Education information will improve Description: Including pain rating scale, medication(s)/side effects and non-pharmacologic comfort measures Outcome: Progressing   Problem: Clinical Measurements: Goal: Ability to maintain clinical measurements within normal limits will improve Outcome: Progressing Goal: Will remain free from infection Outcome: Progressing Goal: Diagnostic test results will improve Outcome: Progressing Goal: Respiratory complications will improve Outcome: Progressing Goal: Cardiovascular complication will be avoided Outcome: Progressing   Problem: Activity: Goal: Risk for activity intolerance will decrease Outcome: Progressing   Problem: Coping: Goal: Level of anxiety will decrease Outcome: Progressing   Problem: Elimination: Goal: Will not experience complications related to bowel motility Outcome: Not Progressing   Problem: Pain Managment: Goal: General experience of comfort will improve Note: Per patient no BM in 5 days, notified NP, orders were placed and medications were given. No BM as of yet.   Problem: Safety: Goal: Ability to remain free from injury will improve Outcome: Progressing

## 2019-10-28 ENCOUNTER — Inpatient Hospital Stay
Admit: 2019-10-28 | Discharge: 2019-10-28 | Disposition: A | Discharge status: Home / Self Care | Payer: Medicare Other | Attending: Internal Medicine | Admitting: Internal Medicine

## 2019-10-28 LAB — BASIC METABOLIC PANEL
Anion gap: 10 (ref 5–15)
BUN: 18 mg/dL (ref 8–23)
CO2: 22 mmol/L (ref 22–32)
Calcium: 9.4 mg/dL (ref 8.9–10.3)
Chloride: 101 mmol/L (ref 98–111)
Creatinine, Ser: 1.77 mg/dL — ABNORMAL HIGH (ref 0.44–1.00)
GFR calc Af Amer: 32 mL/min — ABNORMAL LOW (ref >60)
GFR calc non Af Amer: 28 mL/min — ABNORMAL LOW (ref >60)
Glucose, Bld: 134 mg/dL — ABNORMAL HIGH (ref 70–99)
Potassium: 3.9 mmol/L (ref 3.5–5.1)
Sodium: 133 mmol/L — ABNORMAL LOW (ref 135–145)

## 2019-10-28 LAB — MAGNESIUM: Magnesium: 1.8 mg/dL (ref 1.7–2.4)

## 2019-10-28 LAB — CBC
HCT: 33.8 % — ABNORMAL LOW (ref 36.0–46.0)
Hemoglobin: 10.8 g/dL — ABNORMAL LOW (ref 12.0–15.0)
MCH: 29.5 pg (ref 26.0–34.0)
MCHC: 32 g/dL (ref 30.0–36.0)
MCV: 92.3 fL (ref 80.0–100.0)
Platelets: 264 10*3/uL (ref 150–400)
RBC: 3.66 MIL/uL — ABNORMAL LOW (ref 3.87–5.11)
RDW: 12.1 % (ref 11.5–15.5)
WBC: 8.2 10*3/uL (ref 4.0–10.5)
nRBC: 0 % (ref 0.0–0.2)

## 2019-10-28 MED ORDER — ATORVASTATIN CALCIUM 40 MG PO TABS: 40.0000 mg | ORAL_TABLET | Freq: Every day | ORAL | 2 refills | Status: DC

## 2019-10-28 MED ORDER — HYDRALAZINE HCL 50 MG PO TABS
50.0000 mg | ORAL_TABLET | Freq: Three times a day (TID) | ORAL | Status: DC
  Filled 2019-10-28: qty 1

## 2019-10-28 MED ORDER — IRBESARTAN 150 MG PO TABS
300.0000 mg | ORAL_TABLET | Freq: Every day | ORAL | Status: DC
  Administered 2019-10-28: 300 mg via ORAL
  Filled 2019-10-28: qty 2

## 2019-10-28 MED ORDER — CETIRIZINE HCL 10 MG PO TABS: 10.0000 mg | ORAL_TABLET | Freq: Every day | ORAL | Status: DC | PRN

## 2019-10-28 MED ORDER — CLOPIDOGREL BISULFATE 75 MG PO TABS: 75.0000 mg | ORAL_TABLET | Freq: Every day | ORAL | 2 refills | Status: DC

## 2019-10-28 MED ORDER — ASPIRIN 81 MG PO TABS: 81.0000 mg | ORAL_TABLET | Freq: Every day | ORAL | Status: AC

## 2019-10-28 NOTE — Plan of Care (Signed)
  Problem: Education: Goal: Knowledge of General Education information will improve Description: Including pain rating scale, medication(s)/side effects and non-pharmacologic comfort measures Outcome: Progressing   Problem: Clinical Measurements: Goal: Ability to maintain clinical measurements within normal limits will improve Outcome: Progressing Goal: Will remain free from infection Outcome: Progressing Goal: Diagnostic test results will improve Outcome: Progressing Goal: Respiratory complications will improve Outcome: Progressing Goal: Cardiovascular complication will be avoided Outcome: Progressing   Problem: Activity: Goal: Risk for activity intolerance will decrease Outcome: Progressing   Problem: Coping: Goal: Level of anxiety will decrease Outcome: Progressing   Problem: Elimination: Goal: Will not experience complications related to bowel motility Outcome: Progressing Note: Patient had small BM during shift   Problem: Safety: Goal: Ability to remain free from injury will improve Outcome: Progressing   Problem: Education: Goal: Knowledge of secondary prevention will improve Outcome: Progressing

## 2019-10-28 NOTE — Discharge Summary (Signed)
Physician Discharge Summary   Betty Nielsen  female DOB: [birth year]  [id number]  PCP: Steele Sizer, MD  Admit date: 10/26/2019 Discharge date: 10/28/2019  Admitted From: home Disposition:  Home.  Son updated at bedside prior to discharge.  CODE STATUS: Full code  Discharge Instructions    Diet - low sodium heart healthy   Complete by: As directed    Discharge instructions   Complete by: As directed    You had a small stroke, but luckily you don't have any major neurological deficit from it.  Neurologist (brain doctor) want you to take aspirin 81 and Plavix 75 mg daily for the next 3 months.  After that, just take Plavix 75 mg daily (no aspirin).  Please follow up with your PCP to continue the prescription for Plavix.  We are also starting you on cholesterol medication Lipitor 40 mg daily.  Please follow up with your PCP to check cholesterol level 2-3 months from now to make sure it is lower (LDL 279 now, better to be <80).  Dr. Enzo Bi - -   Increase activity slowly   Complete by: As directed        Hospital Course:  For full details, please see H&P, progress notes, consult notes and ancillary notes.  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  BP elevated. Patient on ASA prior to admission.Carotid dopplers showed no evidence of hemodynamically significant stenosis. A1c 5.8, LDL279.  Started on lipitor 40 mg daily.  Prophylactic therapy-Dual antiplatelet therapy with ASA 81mg  and Plavix 75mg  for three weeks with change toPlavix 75mg  dailyalone as monotherapy after that time, per neuro.  PT/OT/SLP rec no needs.  # Hypertensive urgency:  Permissive hypertension during the first  48 hours, and home BP meds were resumed.    #Hyponatremia: Mild, likely chronic, nosymptoms.    # History of AAA, anemia, GERD, hypertension, hypothyroidism, depression, obesity, BPPV, CKD stage III, hyperlipidemia CAD, polymyalgia rheumatica   Discharge Diagnoses:  Principal Problem:   CVA (cerebral vascular accident) [facility]) Active Problems:   Hyponatremia   Hypertensive urgency   Stroke (cerebrum) [facility])    Discharge Instructions:  Allergies as of 10/28/2019      Reactions   Ace Inhibitors Cough   Ciprofloxacin Other (See Comments)   unknown   Citalopram    hyponatremia   Hydrocodone-acetaminophen Other (See Comments)   unknown   Norvasc [amlodipine Besylate]    Pruritus   Pantoprazole Sodium Other (See Comments)   Cramps   Pravastatin Other (See Comments)   Stomach cramps   Sulfa Antibiotics    unknown      Medication List    TAKE these medications   aspirin 81 MG tablet Take 1 tablet (81 mg total) by mouth daily. Stop after 01/26/20. What changed: additional instructions   atorvastatin 40 MG tablet Commonly known as: LIPITOR Take 1 tablet (40 mg total) by mouth daily at 6 PM. Cholesterol medication.   cetirizine 10 MG tablet Commonly known as: ZYRTEC Take 1 tablet (10 mg total) by mouth daily as needed. PRN only   cloNIDine 0.2 MG tablet Commonly known as: CATAPRES Take 0.2 mg by mouth 2 (two) times daily.   clopidogrel 75 MG tablet Commonly known as: PLAVIX Take 1  tablet (75 mg total) by mouth daily. Blood thinner to help reduce stroke risk.   Fish Oil 1000 MG Cpdr Take 2 capsules by mouth daily.   irbesartan 300 MG tablet Commonly known as: AVAPRO Take 1 tablet (300 mg total) by mouth daily.   labetalol 100 MG tablet Commonly known as: NORMODYNE Take 1 tablet by mouth 2 (two) times daily.   levothyroxine 75 MCG tablet Commonly known as: SYNTHROID Take 1 tablet (75 mcg total) by mouth daily before breakfast.   sucralfate 1 g  tablet Commonly known as: Carafate Take 1 tablet (1 g total) by mouth 4 (four) times daily -  with meals and at bedtime for 15 days.   Vitamin D-1000 Max St 25 MCG (1000 UT) tablet Generic drug: Cholecalciferol Take 1,000 Units by mouth daily.       Follow-up Information    Steele Sizer, MD. Schedule an appointment as soon as possible for a visit in 1 week(s).   Specialty: Family Medicine Contact information: [address] [place] [address] [phone]           Allergies  Allergen Reactions  . Ace Inhibitors Cough  . Ciprofloxacin Other (See Comments)    unknown  . Citalopram     hyponatremia  . Hydrocodone-Acetaminophen Other (See Comments)    unknown  . Norvasc [Amlodipine Besylate]     Pruritus   . Pantoprazole Sodium Other (See Comments)    Cramps  . Pravastatin Other (See Comments)    Stomach cramps  . Sulfa Antibiotics     unknown     The results of significant diagnostics from this hospitalization (including imaging, microbiology, ancillary and laboratory) are listed below for reference.   Consultations:   Procedures/Studies: DG Chest 2 View  Result Date: 10/25/2019 CLINICAL DATA:  Cardiac palpitations EXAM: CHEST - 2 VIEW COMPARISON:  10/11/2019 FINDINGS: Cardiac shadow is stable. Aortic calcifications are again seen. Elevation of left hemidiaphragm is again noted. No bony abnormality is seen. IMPRESSION: No acute abnormality seen. Electronically Signed   By: Inez Catalina M.D.   On: 10/25/2019 23:44   DG Chest 2 View  Result Date: 10/11/2019 CLINICAL DATA:  Chest pain EXAM: CHEST - 2 VIEW COMPARISON:  08/22/2018 FINDINGS: The lungs are clear without focal pneumonia, edema, pneumothorax or pleural effusion. Cardiopericardial silhouette is at upper limits of normal for size. The visualized bony structures of the thorax are intact. IMPRESSION: Stable.  No acute findings. Electronically Signed   By: Misty Stanley M.D.   On: 10/11/2019 10:51    CT Head Wo Contrast  Result Date: 10/26/2019 CLINICAL DATA:  Left leg and arm numbness EXAM: CT HEAD WITHOUT CONTRAST TECHNIQUE: Contiguous axial images were obtained from the base of the skull through the vertex without intravenous contrast. COMPARISON:  04/13/2013 FINDINGS: Brain: Mild atrophic changes are noted commensurate with the patient's given age. No findings to suggest acute hemorrhage, acute infarction or space-occupying mass are noted. Small lacunar infarct is noted within the left thalamus. Vascular: No hyperdense vessel or unexpected calcification. Skull: Normal. Negative for fracture or focal lesion. Sinuses/Orbits: No acute finding. Other: None. IMPRESSION: Chronic atrophic and ischemic changes without acute abnormality. Electronically Signed   By: Inez Catalina M.D.   On: 10/26/2019 02:38   MR BRAIN WO CONTRAST  Result Date: 10/26/2019 CLINICAL DATA:  74 year old female who woke from a nap at 1400 hours with left upper extremity weakness. EXAM: MRI HEAD WITHOUT CONTRAST TECHNIQUE: Multiplanar, multiecho pulse sequences of the brain  and surrounding structures were obtained without intravenous contrast. COMPARISON:  Head CT earlier this morning. FINDINGS: Brain: There is a small 6 millimeter focus of restricted diffusion in the body of the right caudate nucleus (series 2, image 27 and series 4, image 18). Mild if any associated T2 and FLAIR hyperintensity. No associated hemorrhage or mass effect. Superimposed chronic hemorrhagic lacunar infarct of the right posterior lentiform which is nearby. No other restricted diffusion. No other chronic cerebral blood products identified. No midline shift, mass effect, evidence of mass lesion, ventriculomegaly, extra-axial collection or acute intracranial hemorrhage. Cervicomedullary junction and pituitary are within normal limits. Patchy and scattered bilateral cerebral white matter T2 and FLAIR hyperintensity. Moderate similar patchy T2 and FLAIR  hyperintensity in the pons. 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 Negative cerebellum. No definite chronic cortical encephalomalacia. Bulky dural calcifications incidentally noted. Vascular: Major intracranial vascular flow voids are preserved. Skull and upper cervical spine: Negative visible cervical spine. Visualized bone marrow signal is within normal limits. Sinuses/Orbits: Negative orbits. Paranasal sinuses and mastoids are stable and well pneumatized. Other: Grossly negative visible internal auditory structures. Scalp and face soft tissues appear negative. IMPRESSION: 1. Small acute lacunar infarct in the right caudate nucleus. No associated hemorrhage or mass effect. 2. Underlying chronic small vessel disease with previous bilateral deep gray nuclei lacunar infarcts (chronic hemosiderin associated in the right lentiform), patchy bilateral cerebral white matter and pontine signal changes. Electronically Signed   By: Genevie Ann M.D.   On: 10/26/2019 06:49   US Carotid Bilateral (at [facility] and [facility] only)  Result Date: 10/27/2019 CLINICAL DATA:  74 year old female with a history of CVA EXAM: BILATERAL CAROTID DUPLEX ULTRASOUND TECHNIQUE: Pearline Cables scale imaging, color Doppler and duplex ultrasound were performed of bilateral carotid and vertebral arteries in the neck. COMPARISON:  None. FINDINGS: Criteria: Quantification of carotid stenosis is based on velocity parameters that correlate the residual internal carotid diameter with NASCET-based stenosis levels, using the diameter of the distal internal carotid lumen as the denominator for stenosis measurement. The following velocity measurements were obtained: RIGHT ICA:  Systolic 91 cm/sec, Diastolic 32 cm/sec CCA:  61 cm/sec SYSTOLIC ICA/CCA RATIO:  1.5 ECA:  72 cm/sec LEFT ICA:  Systolic 99991111 cm/sec, Diastolic 26 cm/sec CCA:  67 cm/sec SYSTOLIC ICA/CCA RATIO:  1.5 ECA:  67 cm/sec Right Brachial SBP: Not acquired Left Brachial SBP: Not  acquired RIGHT CAROTID ARTERY: No significant calcifications of the right common carotid artery. Intermediate waveform maintained. Heterogeneous and partially calcified plaque at the right carotid bifurcation. Mild lumen shadowing. Low resistance waveform of the right ICA. No significant tortuosity. RIGHT VERTEBRAL ARTERY: Antegrade flow with low resistance waveform. LEFT CAROTID ARTERY: No significant calcifications of the left common carotid artery. Intermediate waveform maintained. Heterogeneous and partially calcified plaque at the left carotid bifurcation with mild lumen shadowing. Low resistance waveform of the left ICA. No significant tortuosity. LEFT VERTEBRAL ARTERY:  Antegrade flow with low resistance waveform. IMPRESSION: Color duplex indicates minimal heterogeneous and calcified plaque, with no hemodynamically significant stenosis by duplex criteria in the extracranial cerebrovascular circulation. Signed, Dulcy Fanny. Dellia Nims, RPVI [facility] Radiology Electronically Signed   By: Corrie Mckusick D.O.   On: 10/27/2019 08:10   ECHOCARDIOGRAM COMPLETE  Result Date: 10/29/2019   ECHOCARDIOGRAM REPORT   Patient Name:   Betty Nielsen Garvey Date of Exam: 10/28/2019 Medical Rec #:  [id number]       Height:  63.0 in Accession #:    [id number]      Weight:       188.3 lb Date of Birth:  Jun 15, 1946        BSA:          1.88 m Patient Age:    74 years        BP:           155/64 mmHg Patient Gender: F               HR:           60 bpm. Exam Location:  [facility] Procedure: 2D Echo Indications:     Stroke 434.91/ I63.9  History:         Patient has no prior history of Echocardiogram examinations.  Sonographer:     Arville Go RDCS Referring Phys:  [id number] Lenna Sciara Diagnosing Phys: Neoma Laming MD  Sonographer Comments: Technically difficult study due to poor echo windows. IMPRESSIONS  1. Left ventricular ejection fraction, by estimation, is 60 to 65%. The left  ventricle has normal function. Left ventricular diastolic parameters are consistent with Grade I diastolic dysfunction (impaired relaxation).  2. Right ventricular systolic function is normal.  3. Cannot r/o vegeatation on AV leaflets. The aortic valve is abnormal. Mild to moderate aortic valve stenosis. FINDINGS  Left Ventricle: Left ventricular ejection fraction, by estimation, is 60 to 65%. The left ventricle has normal function. The left ventricle has no regional wall motion abnormalities. The left ventricular internal cavity size was normal in size. There is  no left ventricular hypertrophy. Left ventricular diastolic parameters are consistent with Grade I diastolic dysfunction (impaired relaxation). Right Ventricle: The right ventricular size is normal. No increase in right ventricular wall thickness. Right ventricular systolic function is normal. Left Atrium: Left atrial size was mildly dilated. Cannot r/o LA thrombus. Right Atrium: Right atrial size was normal in size. Pericardium: There is no evidence of pericardial effusion. Mitral Valve: The mitral valve is normal in structure and function. Normal mobility of the mitral valve leaflets. No evidence of mitral valve regurgitation. No evidence of mitral valve stenosis. Tricuspid Valve: The tricuspid valve is normal in structure. Tricuspid valve regurgitation is not demonstrated. No evidence of tricuspid stenosis. Aortic Valve: The aortic valve is abnormal. Aortic valve regurgitation is not visualized. Mild to moderate aortic stenosis is present. Aortic valve peak gradient measures 7.0 mmHg. Cannot r/o vegeatation on AV leaflets. Pulmonic Valve: The pulmonic valve was normal in structure. Pulmonic valve regurgitation is not visualized. Pulmonic regurgitation is not visualized. No evidence of pulmonic stenosis. Aorta: The aortic root is normal in size and structure. Pulmonary Artery: The pulmonary artery is of normal size. Venous: The left upper pulmonary vein,  left lower pulmonary vein, right upper pulmonary vein and right lower pulmonary vein are normal. The inferior vena cava is normal in size with greater than 50% respiratory variability, suggesting right atrial pressure of 3 mmHg. IAS/Shunts: No atrial level shunt detected by color flow Doppler.  LEFT VENTRICLE PLAX 2D LVIDd:         3.64 cm  Diastology LVIDs:         2.38 cm  LV e' lateral:   5.00 cm/s LV PW:         0.96 cm  LV E/e' lateral: 15.6 LV IVS:        1.04 cm  LV e' medial:    3.86 cm/s LVOT diam:     1.90 cm  LV  E/e' medial:  20.2 LV SV:         36 ml LV SV Index:   18.23 LVOT Area:     2.84 cm  RIGHT VENTRICLE RV Basal diam:  2.57 cm LEFT ATRIUM             Index LA diam:        3.60 cm 1.91 cm/m LA Vol (A2C):   24.6 ml 13.05 ml/m LA Vol (A4C):   34.0 ml 18.04 ml/m LA Biplane Vol: 28.5 ml 15.12 ml/m  AORTIC VALVE                PULMONIC VALVE AV Area (Vmax): 2.21 cm    PV Vmax:       0.99 m/s AV Vmax:        132.00 cm/s PV Peak grad:  3.9 mmHg AV Peak Grad:   7.0 mmHg LVOT Vmax:      103.00 cm/s LVOT Vmean:     65.400 cm/s LVOT VTI:       0.207 m  AORTA Ao Root diam: 3.40 cm MITRAL VALVE MV Area (PHT): 2.76 cm             SHUNTS MV PHT:        79.75 msec           Systemic VTI:  0.21 m MV Decel Time: 275 msec             Systemic Diam: 1.90 cm MV E velocity: 78.10 cm/s 103 cm/s MV A velocity: 90.70 cm/s 70.3 cm/s MV E/A ratio:  0.86       1.5  Neoma Laming MD Electronically signed by Neoma Laming MD Signature Date/Time: 10/29/2019/9:40:03 AM    Final       Labs: BNP (last 3 results) No results for input(s): BNP in the last 8760 hours. Basic Metabolic Panel: Recent Labs  Lab 10/28/19 1109  NA 133*  K 3.9  CL 101  CO2 22  GLUCOSE 134*  BUN 18  CREATININE 1.77*  CALCIUM 9.4  MG 1.8   Liver Function Tests: No results for input(s): AST, ALT, ALKPHOS, BILITOT, PROT, ALBUMIN in the last 168 hours. No results for input(s): LIPASE, AMYLASE in the last 168 hours. No results for  input(s): AMMONIA in the last 168 hours. CBC: Recent Labs  Lab 10/28/19 1109  WBC 8.2  HGB 10.8*  HCT 33.8*  MCV 92.3  PLT 264   Cardiac Enzymes: No results for input(s): CKTOTAL, CKMB, CKMBINDEX, TROPONINI in the last 168 hours. BNP: Invalid input(s): POCBNP CBG: No results for input(s): GLUCAP in the last 168 hours. D-Dimer No results for input(s): DDIMER in the last 72 hours. Hgb A1c No results for input(s): HGBA1C in the last 72 hours. Lipid Profile No results for input(s): CHOL, HDL, LDLCALC, TRIG, CHOLHDL, LDLDIRECT in the last 72 hours. Thyroid function studies No results for input(s): TSH, T4TOTAL, T3FREE, THYROIDAB in the last 72 hours.  Invalid input(s): FREET3 Anemia work up No results for input(s): VITAMINB12, FOLATE, FERRITIN, TIBC, IRON, RETICCTPCT in the last 72 hours. Urinalysis    Component Value Date/Time   COLORURINE COLORLESS (A) 08/29/2018 1911   APPEARANCEUR CLEAR (A) 08/29/2018 1911   APPEARANCEUR Clear 04/04/2012 1438   LABSPEC 1.002 (L) 08/29/2018 1911   LABSPEC 1.004 04/04/2012 1438   PHURINE 7.0 08/29/2018 1911   GLUCOSEU NEGATIVE 08/29/2018 1911   GLUCOSEU Negative 04/04/2012 1438   HGBUR MODERATE (A) 08/29/2018 1911   BILIRUBINUR NEGATIVE 08/29/2018  1911   BILIRUBINUR Negative 04/04/2012 [place] 08/29/2018 1911   PROTEINUR 30 (A) 08/29/2018 1911   UROBILINOGEN 0.2 04/15/2008 0946   NITRITE NEGATIVE 08/29/2018 1911   LEUKOCYTESUR NEGATIVE 08/29/2018 1911   LEUKOCYTESUR Trace 04/04/2012 1438   Sepsis Labs Invalid input(s): PROCALCITONIN,  WBC,  LACTICIDVEN Microbiology Recent Results (from the past 240 hour(s))  SARS CORONAVIRUS 2 (TAT 6-24 HRS) Nasopharyngeal Nasopharyngeal Swab     Status: None   Collection Time: 10/27/19  3:38 AM   Specimen: Nasopharyngeal Swab  Result Value Ref Range Status   SARS Coronavirus 2 NEGATIVE NEGATIVE Final    Comment: (NOTE) SARS-CoV-2 target nucleic acids are NOT DETECTED. The  SARS-CoV-2 RNA is generally detectable in upper and lower respiratory specimens during the acute phase of infection. Negative results do not preclude SARS-CoV-2 infection, do not rule out co-infections with other pathogens, and should not be used as the sole basis for treatment or other patient management decisions. Negative results must be combined with clinical observations, patient history, and epidemiological information. The expected result is Negative. Fact Sheet for Patients: SugarRoll.be Fact Sheet for Healthcare Providers: https://www.woods-mathews.com/ This test is not yet approved or cleared by the [place] FDA and  has been authorized for detection and/or diagnosis of SARS-CoV-2 by FDA under an Emergency Use Authorization (EUA). This EUA will remain  in effect (meaning this test can be used) for the duration of the COVID-19 declaration under Section 56 4(b)(1) of the Act, 21 U.S.C. section 360bbb-3(b)(1), unless the authorization is terminated or revoked sooner. Performed at [facility], [place] [address] [place], [place] [address]      Total time spend on discharging this patient, including the last patient exam, discussing the hospital stay, instructions for ongoing care as it relates to all pertinent caregivers, as well as preparing the medical discharge records, prescriptions, and/or referrals as applicable, is 35 minutes.    Enzo Bi, MD  [facility] 11/02/2019, 12:44 AM  If 7PM-7AM, please contact night-coverage

## 2019-10-28 NOTE — Progress Notes (Signed)
*  PRELIMINARY RESULTS* Echocardiogram 2D Echocardiogram has been performed.  Betty Nielsen 10/28/2019, 2:59 PM

## 2019-10-28 NOTE — Progress Notes (Signed)
Pt is being discharged home.  Discharge papers given and explained to pt. Pt verbalized understanding. Meds and f/u appointments reviewed. Rx sent electronically to pharmacy. Pt made aware.  

## 2019-10-29 ENCOUNTER — Telehealth: Payer: Self-pay

## 2019-10-29 LAB — ECHOCARDIOGRAM COMPLETE: Weight: 3012.37 oz

## 2019-10-29 NOTE — Telephone Encounter (Signed)
Transition Care Management Follow-up Telephone Call  Date of discharge and from where: 10/28/19 [facility]  How have you been since you were released from the hospital? Pt states she is doing okay  Any questions or concerns? Yes  - pt needs refills for sucralfate, has enough to last until appt.   Items Reviewed:  Did the pt receive and understand the discharge instructions provided? Yes   Medications obtained and verified? Yes   Any new allergies since your discharge? No   Dietary orders reviewed? Yes  Do you have support at home? Yes   Functional Questionnaire: (I = Independent and D = Dependent) ADLs: I  Bathing/Dressing- I  Meal Prep- I  Eating- I  Maintaining continence- I  Transferring/Ambulation- I  Managing Meds- I  Follow up appointments reviewed:   PCP Hospital f/u appt confirmed? Yes  Scheduled to see Dr. Ancil Boozer on 11/02/19 @ 3:20.  Are transportation arrangements needed? No   If their condition worsens, is the pt aware to call PCP or go to the Emergency Dept.? Yes  Was the patient provided with contact information for the PCP's office or ED? Yes  Was to pt encouraged to call back with questions or concerns? Yes

## 2019-11-02 ENCOUNTER — Encounter: Payer: Self-pay | Admitting: Family Medicine

## 2019-11-02 ENCOUNTER — Other Ambulatory Visit: Payer: Self-pay

## 2019-11-02 ENCOUNTER — Ambulatory Visit (INDEPENDENT_AMBULATORY_CARE_PROVIDER_SITE_OTHER): Payer: Medicare Other | Admitting: Family Medicine

## 2019-11-02 VITALS — BP 160/90 | HR 70 | Temp 97.1°F | Resp 16 | Ht 63.0 in | Wt 189.6 lb

## 2019-11-02 DIAGNOSIS — I10 Essential (primary) hypertension: Secondary | ICD-10-CM | POA: Diagnosis not present

## 2019-11-02 DIAGNOSIS — Z8673 Personal history of transient ischemic attack (TIA), and cerebral infarction without residual deficits: Secondary | ICD-10-CM | POA: Diagnosis not present

## 2019-11-02 DIAGNOSIS — Z09 Encounter for follow-up examination after completed treatment for conditions other than malignant neoplasm: Secondary | ICD-10-CM

## 2019-11-02 DIAGNOSIS — E890 Postprocedural hypothyroidism: Secondary | ICD-10-CM

## 2019-11-02 NOTE — Progress Notes (Signed)
Name: Betty Nielsen   MRN: [id number]    DOB: 1946-08-20   Date:11/02/2019       Progress Note  Subjective  Chief Complaint  Chief Complaint  Patient presents with  . Hospitalization Follow-up    HPI  Hospital Discharge Follow up: She mopped  her floor on  10/25/2019, she sat down on a chair to wait for it to dry and dozed off to sleep when she woke up her left side was weak, she was able to get up and call her son but did not tell him what happened. She kept massaging her arm and it improved, but later that day she noticed numbness and she called her daughter in law and she took her to [facility] on 10/25/2019. Upon arrival to [facility] bp was 172/98 CT head chosed chronic atrophic and ischemic changes without acute changes MRI showed small acute lacunar infarct in the right caudate nucleus. No associated hemorrhage or mass effect. She was given aspirin and plavix for 3 months  and after that she may stop aspirin and continue on plavix. She was also given Atovrastatin to take 40 mg daily BP medication was help initially but may resume medication now. She was anemic and was given sucralfate to take while on Plavix and aspirin. LDL was 279    Hypothyroidism: she states that since pharmacy has changed the manufacture of her thyroid medication, she has noticed anxiety, diarrhea within 30 minutes of taking medication    Patient Active Problem List   Diagnosis Date Noted  . 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 CVA (cerebral vascular accident) ([place]) 10/26/2019  . Hypertensive urgency 10/26/2019  . PMR (polymyalgia rheumatica) ([place]) 06/15/2017  . Elevated troponin 10/27/2016  . Essential hypertension, malignant 10/27/2016  . Headache 10/27/2016  . CKD (chronic kidney disease), stage III 10/27/2016  . Hyperlipidemia 10/27/2016  . Hyponatremia 10/27/2016  . Angina pectoris ([place]) 10/25/2016  . History of non-ST elevation myocardial infarction (NSTEMI) 10/25/2016  . History of coronary angioplasty  10/25/2016  . BPV (benign positional vertigo) 06/15/2016  . 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[place]) 11/21/2015  . Anemia of chronic disease 06/05/2015  . Acid reflux 06/05/2015  . Hypertension, benign 06/05/2015  . Hypothyroidism, 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  . Major depression in remission ([place]) 06/05/2015  . Obesity (BMI 30.0-34.9) 06/05/2015  . Neuropathy 06/05/2015  . Arthritis, degenerative 07/03/2014  . AAA (abdominal aortic aneurysm) without rupture ([place]) 06/26/2014    Past Surgical History:  Procedure Laterality Date  . ABDOMINAL AORTIC ANEURYSM REPAIR    . ABDOMINAL HYSTERECTOMY    . APPENDECTOMY    . CORONARY STENT INTERVENTION N/A 10/26/2016   Procedure: Coronary Stent Intervention;  Surgeon: Yolonda Kida, MD;  Location: [place] [facility];  Service: Cardiovascular;  Laterality: N/A;  . HERNIA REPAIR    . INNER EAR SURGERY     pt not sure of type  . LEFT HEART CATH AND CORONARY ANGIOGRAPHY N/A 10/26/2016   Procedure: Left Heart Cath and Coronary Angiography;  Surgeon: Teodoro Spray, MD;  Location: [place] [facility];  Service: Cardiovascular;  Laterality: N/A;  . THYROIDECTOMY      Family History  Problem Relation Age of Onset  . Healthy Mother   . Cerebral aneurysm Father     Social History   Tobacco Use  . Smoking status: Former Smoker    Packs/day: 0.50    Years: 20.00    Pack years: 10.00    Types: Cigarettes    Quit  date: 2000    Years since quitting: 21.1  . Smokeless tobacco: Never Used  . Tobacco comment: smoking cessation materials not required  Substance Use Topics  . Alcohol use: No    Alcohol/week: 0.0 standard drinks  . Drug use: No     Current Outpatient Medications:  .  aspirin 81 MG tablet, Take 1 tablet (81 mg total) by mouth daily. Stop after 01/26/20., Disp: 30 tablet, Rfl:  .  atorvastatin (LIPITOR) 40 MG tablet, Take 1 tablet (40 mg total) by mouth daily at 6 PM. Cholesterol medication., Disp: 30 tablet, Rfl: 2 .  cetirizine  (ZYRTEC) 10 MG tablet, Take 1 tablet (10 mg total) by mouth daily as needed. PRN only, Disp: , Rfl:  .  Cholecalciferol (VITAMIN D-1000 MAX ST) 1000 units tablet, Take 1,000 Units by mouth daily. , Disp: , Rfl:  .  cloNIDine (CATAPRES) 0.2 MG tablet, Take 0.2 mg by mouth 2 (two) times daily., Disp: , Rfl:  .  clopidogrel (PLAVIX) 75 MG tablet, Take 1 tablet (75 mg total) by mouth daily. Blood thinner to help reduce stroke risk., Disp: 30 tablet, Rfl: 2 .  irbesartan (AVAPRO) 300 MG tablet, Take 1 tablet (300 mg total) by mouth daily., Disp: 90 tablet, Rfl: 1 .  labetalol (NORMODYNE) 100 MG tablet, Take 1 tablet by mouth 2 (two) times daily. , Disp: , Rfl:  .  levothyroxine (SYNTHROID) 75 MCG tablet, Take 1 tablet (75 mcg total) by mouth daily before breakfast., Disp: 30 tablet, Rfl: 5 .  Omega-3 Fatty Acids (FISH OIL) 1000 MG CPDR, Take 2 capsules by mouth daily., Disp: , Rfl:  .  sucralfate (CARAFATE) 1 g tablet, Take 1 tablet (1 g total) by mouth 4 (four) times daily -  with meals and at bedtime for 15 days., Disp: 60 tablet, Rfl: 1  Allergies  Allergen Reactions  . Ace Inhibitors Cough  . Ciprofloxacin Other (See Comments)    unknown  . Citalopram     hyponatremia  . Hydrocodone-Acetaminophen Other (See Comments)    unknown  . Norvasc [Amlodipine Besylate]     Pruritus   . Pantoprazole Sodium Other (See Comments)    Cramps  . Pravastatin Other (See Comments)    Stomach cramps  . Sulfa Antibiotics     unknown    I personally reviewed active problem list, medication list, allergies, family history, social history, health maintenance with the patient/caregiver today.   ROS  Constitutional: Negative for fever or weight change.  Respiratory: Negative for cough and shortness of breath.   Cardiovascular: Negative for chest pain or palpitations.  Gastrointestinal: Negative for abdominal pain, no bowel changes.  Musculoskeletal: Negative for gait problem or joint swelling.  Skin:  Negative for rash.  Neurological: Negative for dizziness or headache.  No other specific complaints in a complete review of systems (except as listed in HPI above).  Objective  Vitals:   11/02/19 1522  BP: (!) 150/90  Pulse: 70  Resp: 16  Temp: (!) 97.1 F (36.2 C)  TempSrc: Temporal  SpO2: 98%  Weight: 189 lb 9.6 oz (86 kg)  Height: 5\' 3"  (1.6 m)    Body mass index is 33.59 kg/m.  Physical Exam  Constitutional: Patient appears well-developed and well-nourished. Obese  No distress.  HEENT: head atraumatic, normocephalic, pupils equal and reactive to light Cardiovascular: Normal rate, regular rhythm and normal heart sounds.  No murmur heard. No BLE edema. Pulmonary/Chest: Effort normal and breath sounds normal. No respiratory distress. Abdominal:  Soft.  There is no tenderness. Neurological: no focal deficit, cranial nerves intact , romberg negative, normal sensation  Psychiatric: Patient has a normal mood and affect. behavior is normal. Judgment and thought content normal.  Recent Results (from the past 2160 hour(s))  Basic metabolic panel     Status: Abnormal   Collection Time: 10/11/19 10:23 AM  Result Value Ref Range   Sodium 135 135 - 145 mmol/L   Potassium 4.2 3.5 - 5.1 mmol/L   Chloride 103 98 - 111 mmol/L   CO2 23 22 - 32 mmol/L   Glucose, Bld 120 (H) 70 - 99 mg/dL   BUN 18 8 - 23 mg/dL   Creatinine, Ser 1.73 (H) 0.44 - 1.00 mg/dL   Calcium 9.0 8.9 - 10.3 mg/dL   GFR calc non Af Amer 29 (L) >60 mL/min   GFR calc Af Amer 33 (L) >60 mL/min   Anion gap 9 5 - 15    Comment: Performed at [facility], [address] [place], [place] [address]  CBC     Status: Abnormal   Collection Time: 10/11/19 10:23 AM  Result Value Ref Range   WBC 5.9 4.0 - 10.5 K/uL   RBC 3.60 (L) 3.87 - 5.11 MIL/uL   Hemoglobin 10.7 (L) 12.0 - 15.0 g/dL   HCT 33.5 (L) 36.0 - 46.0 %   MCV 93.1 80.0 - 100.0 fL   MCH 29.7 26.0 - 34.0 pg   MCHC 31.9 30.0 - 36.0 g/dL   RDW 12.0  11.5 - 15.5 %   Platelets 229 150 - 400 K/uL   nRBC 0.0 0.0 - 0.2 %    Comment: Performed at [facility], [place], [place] [address]  Troponin I (High Sensitivity)     Status: None   Collection Time: 10/11/19 10:23 AM  Result Value Ref Range   Troponin I (High Sensitivity) 5 <18 ng/L    Comment: (NOTE) Elevated high sensitivity troponin I (hsTnI) values and significant  changes across serial measurements may suggest ACS but many other  chronic and acute conditions are known to elevate hsTnI results.  Refer to the "Links" section for chest pain algorithms and additional  guidance. Performed at [facility], [address] [place], [place] [id number]   Basic metabolic panel     Status: Abnormal   Collection Time: 10/25/19 11:17 PM  Result Value Ref Range   Sodium 133 (L) 135 - 145 mmol/L   Potassium 3.9 3.5 - 5.1 mmol/L   Chloride 101 98 - 111 mmol/L   CO2 25 22 - 32 mmol/L   Glucose, Bld 101 (H) 70 - 99 mg/dL   BUN 17 8 - 23 mg/dL   Creatinine, Ser 1.67 (H) 0.44 - 1.00 mg/dL   Calcium 9.2 8.9 - 10.3 mg/dL   GFR calc non Af Amer 30 (L) >60 mL/min   GFR calc Af Amer 35 (L) >60 mL/min   Anion gap 7 5 - 15    Comment: Performed at [facility], [address] [place], [place] [address]  CBC     Status: Abnormal   Collection Time: 10/25/19 11:17 PM  Result Value Ref Range   WBC 6.7 4.0 - 10.5 K/uL   RBC 3.51 (L) 3.87 - 5.11 MIL/uL   Hemoglobin 10.6 (L) 12.0 - 15.0 g/dL   HCT 32.0 (L) 36.0 - 46.0 %   MCV 91.2 80.0 - 100.0 fL   MCH 30.2 26.0 - 34.0 pg  MCHC 33.1 30.0 - 36.0 g/dL   RDW 12.1 11.5 - 15.5 %   Platelets 269 150 - 400 K/uL   nRBC 0.0 0.0 - 0.2 %    Comment: Performed at [facility], [place], [place] [address]  Troponin I (High Sensitivity)     Status: None   Collection Time: 10/25/19 11:17 PM  Result Value Ref Range   Troponin I (High Sensitivity) 7 <18 ng/L    Comment: (NOTE) Elevated  high sensitivity troponin I (hsTnI) values and significant  changes across serial measurements may suggest ACS but many other  chronic and acute conditions are known to elevate hsTnI results.  Refer to the "Links" section for chest pain algorithms and additional  guidance. Performed at [facility], [place], [place] [address]   Troponin I (High Sensitivity)     Status: None   Collection Time: 10/26/19  2:18 AM  Result Value Ref Range   Troponin I (High Sensitivity) 7 <18 ng/L    Comment: (NOTE) Elevated high sensitivity troponin I (hsTnI) values and significant  changes across serial measurements may suggest ACS but many other  chronic and acute conditions are known to elevate hsTnI results.  Refer to the "Links" section for chest pain algorithms and additional  guidance. Performed at [facility], [place], [place] [address]   SARS CORONAVIRUS 2 (TAT 6-24 HRS) Nasopharyngeal Nasopharyngeal Swab     Status: None   Collection Time: 10/27/19  3:38 AM   Specimen: Nasopharyngeal Swab  Result Value Ref Range   SARS Coronavirus 2 NEGATIVE NEGATIVE    Comment: (NOTE) SARS-CoV-2 target nucleic acids are NOT DETECTED. The SARS-CoV-2 RNA is generally detectable in upper and lower respiratory specimens during the acute phase of infection. Negative results do not preclude SARS-CoV-2 infection, do not rule out co-infections with other pathogens, and should not be used as the sole basis for treatment or other patient management decisions. Negative results must be combined with clinical observations, patient history, and epidemiological information. The expected result is Negative. Fact Sheet for Patients: SugarRoll.be Fact Sheet for Healthcare Providers: https://www.woods-mathews.com/ This test is not yet approved or cleared by the [place] FDA and  has been authorized for detection and/or  diagnosis of SARS-CoV-2 by FDA under an Emergency Use Authorization (EUA). This EUA will remain  in effect (meaning this test can be used) for the duration of the COVID-19 declaration under Section 56 4(b)(1) of the Act, 21 U.S.C. section 360bbb-3(b)(1), unless the authorization is terminated or revoked sooner. Performed at [facility], [place] [address] [place], [place] [address]   Hemoglobin A1c     Status: Abnormal   Collection Time: 10/27/19  5:22 AM  Result Value Ref Range   Hgb A1c MFr Bld 5.8 (H) 4.8 - 5.6 %    Comment: (NOTE) Pre diabetes:          5.7%-6.4% Diabetes:              >6.4% Glycemic control for   <7.0% adults with diabetes    Mean Plasma Glucose 119.76 mg/dL    Comment: Performed at [facility] [address], [place], [place] [address]  Lipid panel     Status: Abnormal   Collection Time: 10/27/19  5:22 AM  Result Value Ref Range   Cholesterol 361 (H) 0 - 200 mg/dL   Triglycerides 143 <150 mg/dL   HDL 53 >40 mg/dL  Total CHOL/HDL Ratio 6.8 RATIO   VLDL 29 0 - 40 mg/dL   LDL Cholesterol 279 (H) 0 - 99 mg/dL    Comment:        Total Cholesterol/HDL:CHD Risk Coronary Heart Disease Risk Table                     Men   Women  1/2 Average Risk   3.4   3.3  Average Risk       5.0   4.4  2 X Average Risk   9.6   7.1  3 X Average Risk  23.4   11.0        Use the calculated Patient Ratio above and the CHD Risk Table to determine the patient's CHD Risk.        ATP III CLASSIFICATION (LDL):  <100     mg/dL   Optimal  100-129  mg/dL   Near or Above                    Optimal  130-159  mg/dL   Borderline  160-189  mg/dL   High  >190     mg/dL   Very High Performed at [facility], [address] [place], [place] [id number]   Basic metabolic panel     Status: Abnormal   Collection Time: 10/28/19 11:09 AM  Result Value Ref Range   Sodium 133 (L) 135 - 145 mmol/L   Potassium 3.9 3.5 - 5.1 mmol/L   Chloride 101 98 - 111 mmol/L   CO2  22 22 - 32 mmol/L   Glucose, Bld 134 (H) 70 - 99 mg/dL   BUN 18 8 - 23 mg/dL   Creatinine, Ser 1.77 (H) 0.44 - 1.00 mg/dL   Calcium 9.4 8.9 - 10.3 mg/dL   GFR calc non Af Amer 28 (L) >60 mL/min   GFR calc Af Amer 32 (L) >60 mL/min   Anion gap 10 5 - 15    Comment: Performed at [facility], [address] [place], [place] [address]  CBC     Status: Abnormal   Collection Time: 10/28/19 11:09 AM  Result Value Ref Range   WBC 8.2 4.0 - 10.5 K/uL   RBC 3.66 (L) 3.87 - 5.11 MIL/uL   Hemoglobin 10.8 (L) 12.0 - 15.0 g/dL   HCT 33.8 (L) 36.0 - 46.0 %   MCV 92.3 80.0 - 100.0 fL   MCH 29.5 26.0 - 34.0 pg   MCHC 32.0 30.0 - 36.0 g/dL   RDW 12.1 11.5 - 15.5 %   Platelets 264 150 - 400 K/uL   nRBC 0.0 0.0 - 0.2 %    Comment: Performed at [facility], [address] [place], [place] [address]  Magnesium     Status: None   Collection Time: 10/28/19 11:09 AM  Result Value Ref Range   Magnesium 1.8 1.7 - 2.4 mg/dL    Comment: Performed at [facility], [address] [place], [place] [address]  ECHOCARDIOGRAM COMPLETE     Status: None   Collection Time: 10/28/19  2:59 PM  Result Value Ref Range   Weight 3,012.37 oz   BP 158/86 mmHg     PHQ2/9: Depression screen [facility] 2/9 11/02/2019 06/20/2019 06/07/2019 12/18/2018 09/12/2018  Decreased Interest 0 0 0 0 0  Down, Depressed, Hopeless 0 0 0 0 0  PHQ - 2 Score 0 0 0 0 0  Altered sleeping 0 0 -  0 2  Tired, decreased energy 0 0 - 0 0  Change in appetite 0 0 - 0 0  Feeling bad or failure about yourself  0 0 - 0 0  Trouble concentrating 0 0 - 0 0  Moving slowly or fidgety/restless 0 0 - 0 0  Suicidal thoughts 0 0 - 0 0  PHQ-9 Score 0 0 - 0 2  Difficult doing work/chores - Not difficult at all - - Somewhat difficult  Some recent data might be hidden    phq 9 is negative   Fall Risk: Fall Risk  11/02/2019 06/20/2019 06/07/2019 12/18/2018 09/12/2018  Falls in the past year? 0 0 0 0 0  Number falls in past yr: 0 0  0 0 0  Injury with Fall? 0 0 0 0 0  Risk for fall due to : - - - - -  Risk for fall due to: Comment - - - - -  Follow up - - Falls prevention discussed - -    Functional Status Survey: Is the patient deaf or have difficulty hearing?: Yes Does the patient have difficulty seeing, even when wearing glasses/contacts?: No Does the patient have difficulty concentrating, remembering, or making decisions?: No Does the patient have difficulty walking or climbing stairs?: No Does the patient have difficulty dressing or bathing?: No Does the patient have difficulty doing errands alone such as visiting a doctor's office or shopping?: No    Assessment & Plan  1. Hospital discharge follow-up  Continue Plavix and aspirin 81 mg   2. History of CVA (cerebrovascular accident)  Reviewed records and medications with patient , in 3 months we will stop aspirin   3. Hypothyroidism, postsurgical  - TSH, advised to take half pill for the next   4. Hypertension, benign  bp was high today, she has been taking medication, we will ask her to return next week for bp check only

## 2019-11-03 LAB — TSH: TSH: 3.1 mIU/L (ref 0.40–4.50)

## 2019-11-05 ENCOUNTER — Other Ambulatory Visit: Payer: Self-pay

## 2019-11-21 DIAGNOSIS — N2581 Secondary hyperparathyroidism of renal origin: Secondary | ICD-10-CM | POA: Insufficient documentation

## 2019-11-21 DIAGNOSIS — D631 Anemia in chronic kidney disease: Secondary | ICD-10-CM | POA: Insufficient documentation

## 2019-11-21 DIAGNOSIS — N1832 Chronic kidney disease, stage 3b: Secondary | ICD-10-CM | POA: Insufficient documentation

## 2019-11-21 DIAGNOSIS — E871 Hypo-osmolality and hyponatremia: Secondary | ICD-10-CM | POA: Insufficient documentation

## 2019-11-21 DIAGNOSIS — N185 Chronic kidney disease, stage 5: Secondary | ICD-10-CM | POA: Insufficient documentation

## 2019-11-21 DIAGNOSIS — D638 Anemia in other chronic diseases classified elsewhere: Secondary | ICD-10-CM | POA: Insufficient documentation

## 2019-11-30 ENCOUNTER — Ambulatory Visit: Payer: Medicare Other | Attending: Internal Medicine

## 2019-11-30 DIAGNOSIS — Z23 Encounter for immunization: Secondary | ICD-10-CM

## 2019-11-30 NOTE — Progress Notes (Signed)
   Covid-19 Vaccination Clinic  Name:  Betty Nielsen    MRN: [id number] DOB: 1946-05-31  11/30/2019  [name] was observed post Covid-19 immunization for 15 minutes without incident. She was provided with Vaccine Information Sheet and instruction to access the V-Safe system.   [name] was instructed to call 911 with any severe reactions post vaccine: Marland Kitchen Difficulty breathing  . Swelling of face and throat  . A fast heartbeat  . A bad rash all over body  . Dizziness and weakness   Immunizations Administered    Name Date Dose VIS Date Route   Pfizer COVID-19 Vaccine 11/30/2019 12:30 PM 0.3 mL 08/31/2019 Intramuscular   Manufacturer: [place]   Lot: [id number]   [place]: [id number]

## 2019-12-17 ENCOUNTER — Other Ambulatory Visit: Payer: Self-pay

## 2019-12-17 ENCOUNTER — Encounter: Payer: Self-pay | Admitting: Family Medicine

## 2019-12-17 ENCOUNTER — Ambulatory Visit (INDEPENDENT_AMBULATORY_CARE_PROVIDER_SITE_OTHER): Payer: Medicare Other | Admitting: Family Medicine

## 2019-12-17 VITALS — BP 128/76 | HR 82 | Temp 97.4°F | Resp 16 | Ht 65.0 in | Wt 181.6 lb

## 2019-12-17 DIAGNOSIS — I10 Essential (primary) hypertension: Secondary | ICD-10-CM

## 2019-12-17 DIAGNOSIS — I700 Atherosclerosis of aorta: Secondary | ICD-10-CM | POA: Diagnosis not present

## 2019-12-17 DIAGNOSIS — Z8673 Personal history of transient ischemic attack (TIA), and cerebral infarction without residual deficits: Secondary | ICD-10-CM

## 2019-12-17 DIAGNOSIS — N1832 Chronic kidney disease, stage 3b: Secondary | ICD-10-CM

## 2019-12-17 DIAGNOSIS — F325 Major depressive disorder, single episode, in full remission: Secondary | ICD-10-CM

## 2019-12-17 DIAGNOSIS — N2581 Secondary hyperparathyroidism of renal origin: Secondary | ICD-10-CM

## 2019-12-17 DIAGNOSIS — E890 Postprocedural hypothyroidism: Secondary | ICD-10-CM | POA: Diagnosis not present

## 2019-12-17 DIAGNOSIS — E871 Hypo-osmolality and hyponatremia: Secondary | ICD-10-CM

## 2019-12-17 DIAGNOSIS — F5104 Psychophysiologic insomnia: Secondary | ICD-10-CM

## 2019-12-17 DIAGNOSIS — I209 Angina pectoris, unspecified: Secondary | ICD-10-CM

## 2019-12-17 MED ORDER — MIRTAZAPINE 15 MG PO TABS: 15.0000 mg | ORAL_TABLET | Freq: Every day | ORAL | 0 refills | Status: DC

## 2019-12-17 MED ORDER — LEVOTHYROXINE SODIUM 75 MCG PO TABS: 75.0000 ug | ORAL_TABLET | Freq: Every day | ORAL | 5 refills | Status: DC

## 2019-12-17 MED ORDER — CLOPIDOGREL BISULFATE 75 MG PO TABS: 75.0000 mg | ORAL_TABLET | Freq: Every day | ORAL | 1 refills | Status: DC

## 2019-12-17 MED ORDER — ATORVASTATIN CALCIUM 40 MG PO TABS: 40.0000 mg | ORAL_TABLET | Freq: Every day | ORAL | 1 refills | Status: DC

## 2019-12-17 NOTE — Progress Notes (Signed)
Name: Betty Nielsen   MRN: [id number]    DOB: 07/22/1946   Date:12/17/2019       Progress Note  Subjective  Chief Complaint  Chief Complaint  Patient presents with  . Hyperlipidemia  . Hypertension  . Anxiety    follow up after stroke  . Insomnia    HPI  CKI III/ Hyponatremia:stable kidney function when looked at labs done at [facility] 10/2019 by Dr. Abigail Butts and GFR was 35. Good urine output, she denies pruritus, no nausea or vomiting.  HTN/CAD/Angina/Stroke:bp is being managed by Dr. Abigail Butts. At home her bp has been 140's/70'sShe has not used any NTG since Feb 2018 ( when she had MI).She had a Stroke 10/2019 , she is back on  plavix and is taking statin therapy and denies side effects. She states very seldom left arm feels tired, but continues to have some weird feeling in her mouth when she eats bread and beef.   PMR:use to seeDr. Jefm Bryant, tender joints and muscles all over,worse on hips and sometimes shoulders. She states she has been doing well, no longer having problems.   Atherosclerosis abdominal aorta/hyperlipidemia: unable to tolerate statin,currently on Plavix and Atorvastatin she denies easy bleeding, she is also on aspirin but will stop in May 2021.She also has a history of aneurysmal of aorta repair done by Dr. Doren Custard ( vascular surgeon in [place]).   Hyperlipidemia: LDL has been elevated for a long time, refuses statins , Zetia or injectables but since stroke 10/2019 she started taking Atorvastatin 40 mg daily   Hypothyroidism: she is s/p thyroidectomy Compliant with medication, denies dry skin, states constipation under control with bran flakes for breakfast,  denies hair loss . Last TSHwas at goal 10/2019 , she is taking Synthroid 75 mcg daily,  Depression:she is off SSRI because of hyponatremia, but states she is feeling well, in remission.She had an episode of psychosis in the past. She denies sadness, crying spells, change in appetite, or suicidal  thoughts.Phq 9 today today is 3 because of sleep . She states She is tired of staying at home, and since the stroke she has noticed change in appetite.   Patient Active Problem List   Diagnosis Date Noted  . History of stroke 10/27/2019  . PMR (polymyalgia rheumatica) ([place]) 06/15/2017  . Headache 10/27/2016  . CKD (chronic kidney disease), stage III 10/27/2016  . Hyperlipidemia 10/27/2016  . Hyponatremia 10/27/2016  . Angina pectoris ([place]) 10/25/2016  . History of non-ST elevation myocardial infarction (NSTEMI) 10/25/2016  . History of coronary angioplasty 10/25/2016  . BPV (benign positional vertigo) 06/15/2016  . 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[place]) 11/21/2015  . Anemia of chronic disease 06/05/2015  . Acid reflux 06/05/2015  . Hypertension, benign 06/05/2015  . Hypothyroidism, 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  . Major depression in remission ([place]) 06/05/2015  . Obesity (BMI 30.0-34.9) 06/05/2015  . Neuropathy 06/05/2015  . Arthritis, degenerative 07/03/2014  . AAA (abdominal aortic aneurysm) without rupture ([place]) 06/26/2014    Past Surgical History:  Procedure Laterality Date  . ABDOMINAL AORTIC ANEURYSM REPAIR    . ABDOMINAL HYSTERECTOMY    . APPENDECTOMY    . CORONARY STENT INTERVENTION N/A 10/26/2016   Procedure: Coronary Stent Intervention;  Surgeon: Yolonda Kida, MD;  Location: [facility];  Service: Cardiovascular;  Laterality: N/A;  . HERNIA REPAIR    . INNER EAR SURGERY     pt not sure of type  . LEFT HEART CATH AND CORONARY ANGIOGRAPHY N/A 10/26/2016   Procedure: Left  Heart Cath and Coronary Angiography;  Surgeon: Teodoro Spray, MD;  Location: [facility];  Service: Cardiovascular;  Laterality: N/A;  . THYROIDECTOMY      Family History  Problem Relation Age of Onset  . Healthy Mother   . Cerebral aneurysm Father     Social History   Tobacco Use  . Smoking status: Former Smoker    Packs/day: 0.50    Years: 20.00    Pack years: 10.00     Types: Cigarettes    Quit date: 2000    Years since quitting: 21.2  . Smokeless tobacco: Never Used  . Tobacco comment: smoking cessation materials not required  Substance Use Topics  . Alcohol use: No    Alcohol/week: 0.0 standard drinks     Current Outpatient Medications:  .  aspirin 81 MG tablet, Take 1 tablet (81 mg total) by mouth daily. Stop after 01/26/20., Disp: 30 tablet, Rfl:  .  atorvastatin (LIPITOR) 40 MG tablet, Take 1 tablet (40 mg total) by mouth daily at 6 PM. Cholesterol medication., Disp: 30 tablet, Rfl: 2 .  cetirizine (ZYRTEC) 10 MG tablet, Take 1 tablet (10 mg total) by mouth daily as needed. PRN only, Disp: , Rfl:  .  Cholecalciferol (VITAMIN D-1000 MAX ST) 1000 units tablet, Take 1,000 Units by mouth daily. , Disp: , Rfl:  .  cloNIDine (CATAPRES) 0.2 MG tablet, Take 0.2 mg by mouth 2 (two) times daily., Disp: , Rfl:  .  clopidogrel (PLAVIX) 75 MG tablet, Take 1 tablet (75 mg total) by mouth daily. Blood thinner to help reduce stroke risk., Disp: 30 tablet, Rfl: 2 .  irbesartan (AVAPRO) 300 MG tablet, Take 1 tablet (300 mg total) by mouth daily., Disp: 90 tablet, Rfl: 1 .  labetalol (NORMODYNE) 100 MG tablet, Take 1 tablet by mouth 2 (two) times daily. , Disp: , Rfl:  .  levothyroxine (SYNTHROID) 75 MCG tablet, Take 1 tablet (75 mcg total) by mouth daily before breakfast., Disp: 30 tablet, Rfl: 5 .  nitroGLYCERIN (NITROSTAT) 0.4 MG SL tablet, Place under the tongue., Disp: , Rfl:  .  Omega-3 Fatty Acids (FISH OIL) 1000 MG CPDR, Take 2 capsules by mouth daily., Disp: , Rfl:  .  hydrOXYzine (ATARAX/VISTARIL) 25 MG tablet, Take by mouth., Disp: , Rfl:   Allergies  Allergen Reactions  . Ace Inhibitors Cough  . Ciprofloxacin Other (See Comments)    unknown  . Citalopram     hyponatremia  . Hydrocodone-Acetaminophen Other (See Comments)    unknown  . Norvasc [Amlodipine Besylate]     Pruritus   . Pantoprazole Sodium Other (See Comments)    Cramps  . Pravastatin  Other (See Comments)    Stomach cramps  . Sulfa Antibiotics     unknown  . Clonidine Other (See Comments)    Per patient told by allergist that would be best to stop but nephrologist recommended her to stay on medication     I personally reviewed active problem list, medication list, allergies, family history, social history, health maintenance with the patient/caregiver today.   ROS  Constitutional: Negative for fever, positive for  weight change - 8 lbs since last visit   Respiratory: Negative for cough and shortness of breath.   Cardiovascular: Negative for chest pain or palpitations.  Gastrointestinal: Negative for abdominal pain, no bowel changes.  Musculoskeletal: Negative for gait problem or joint swelling.  Skin: Negative for rash.  Neurological: Negative for dizziness or headache.  No other specific  complaints in a complete review of systems (except as listed in HPI above).  Objective  Vitals:   12/17/19 1318  BP: 128/76  Pulse: 82  Resp: 16  Temp: (!) 97.4 F (36.3 C)  TempSrc: Temporal  SpO2: 95%  Weight: 181 lb 9.6 oz (82.4 kg)  Height: 5\' 5"  (1.651 m)    Body mass index is 30.22 kg/m.  Physical Exam  Constitutional: Patient appears well-developed and well-nourished. Obese No distress.  HEENT: head atraumatic, normocephalic, pupils equal and reactive to light Cardiovascular: Normal rate, regular rhythm and normal heart sounds.  No murmur heard. No BLE edema. Pulmonary/Chest: Effort normal and breath sounds normal. No respiratory distress. Abdominal: Soft.  There is no tenderness. Neurological : no focal deficit today  Psychiatric: Patient has a normal mood and affect. behavior is normal. Judgment and thought content normal.  Recent Results (from the past 2160 hour(s))  Basic metabolic panel     Status: Abnormal   Collection Time: 10/11/19 10:23 AM  Result Value Ref Range   Sodium 135 135 - 145 mmol/L   Potassium 4.2 3.5 - 5.1 mmol/L   Chloride 103  98 - 111 mmol/L   CO2 23 22 - 32 mmol/L   Glucose, Bld 120 (H) 70 - 99 mg/dL   BUN 18 8 - 23 mg/dL   Creatinine, Ser 1.73 (H) 0.44 - 1.00 mg/dL   Calcium 9.0 8.9 - 10.3 mg/dL   GFR calc non Af Amer 29 (L) >60 mL/min   GFR calc Af Amer 33 (L) >60 mL/min   Anion gap 9 5 - 15    Comment: Performed at [facility], [address] [place], [place] [address]  CBC     Status: Abnormal   Collection Time: 10/11/19 10:23 AM  Result Value Ref Range   WBC 5.9 4.0 - 10.5 K/uL   RBC 3.60 (L) 3.87 - 5.11 MIL/uL   Hemoglobin 10.7 (L) 12.0 - 15.0 g/dL   HCT 33.5 (L) 36.0 - 46.0 %   MCV 93.1 80.0 - 100.0 fL   MCH 29.7 26.0 - 34.0 pg   MCHC 31.9 30.0 - 36.0 g/dL   RDW 12.0 11.5 - 15.5 %   Platelets 229 150 - 400 K/uL   nRBC 0.0 0.0 - 0.2 %    Comment: Performed at [facility], [place], [place] [address]  Troponin I (High Sensitivity)     Status: None   Collection Time: 10/11/19 10:23 AM  Result Value Ref Range   Troponin I (High Sensitivity) 5 <18 ng/L    Comment: (NOTE) Elevated high sensitivity troponin I (hsTnI) values and significant  changes across serial measurements may suggest ACS but many other  chronic and acute conditions are known to elevate hsTnI results.  Refer to the "Links" section for chest pain algorithms and additional  guidance. Performed at [facility], [address] [place], [place] [address]   Basic metabolic panel     Status: Abnormal   Collection Time: 10/25/19 11:17 PM  Result Value Ref Range   Sodium 133 (L) 135 - 145 mmol/L   Potassium 3.9 3.5 - 5.1 mmol/L   Chloride 101 98 - 111 mmol/L   CO2 25 22 - 32 mmol/L   Glucose, Bld 101 (H) 70 - 99 mg/dL   BUN 17 8 - 23 mg/dL   Creatinine, Ser 1.67 (H) 0.44 - 1.00 mg/dL   Calcium 9.2 8.9 - 10.3 mg/dL   GFR calc non  Af Amer 30 (L) >60 mL/min   GFR calc Af Amer 35 (L) >60 mL/min   Anion gap 7 5 - 15    Comment: Performed at [facility], [address] [place], [place] [address]  CBC     Status: Abnormal   Collection Time: 10/25/19 11:17 PM  Result Value Ref Range   WBC 6.7 4.0 - 10.5 K/uL   RBC 3.51 (L) 3.87 - 5.11 MIL/uL   Hemoglobin 10.6 (L) 12.0 - 15.0 g/dL   HCT 32.0 (L) 36.0 - 46.0 %   MCV 91.2 80.0 - 100.0 fL   MCH 30.2 26.0 - 34.0 pg   MCHC 33.1 30.0 - 36.0 g/dL   RDW 12.1 11.5 - 15.5 %   Platelets 269 150 - 400 K/uL   nRBC 0.0 0.0 - 0.2 %    Comment: Performed at [facility], [place], [place] [address]  Troponin I (High Sensitivity)     Status: None   Collection Time: 10/25/19 11:17 PM  Result Value Ref Range   Troponin I (High Sensitivity) 7 <18 ng/L    Comment: (NOTE) Elevated high sensitivity troponin I (hsTnI) values and significant  changes across serial measurements may suggest ACS but many other  chronic and acute conditions are known to elevate hsTnI results.  Refer to the "Links" section for chest pain algorithms and additional  guidance. Performed at [facility], [place], [place] [address]   Troponin I (High Sensitivity)     Status: None   Collection Time: 10/26/19  2:18 AM  Result Value Ref Range   Troponin I (High Sensitivity) 7 <18 ng/L    Comment: (NOTE) Elevated high sensitivity troponin I (hsTnI) values and significant  changes across serial measurements may suggest ACS but many other  chronic and acute conditions are known to elevate hsTnI results.  Refer to the "Links" section for chest pain algorithms and additional  guidance. Performed at [facility], [place], [place] [address]   SARS CORONAVIRUS 2 (TAT 6-24 HRS) Nasopharyngeal Nasopharyngeal Swab     Status: None   Collection Time: 10/27/19  3:38 AM   Specimen: Nasopharyngeal Swab  Result Value Ref Range   SARS Coronavirus 2 NEGATIVE NEGATIVE    Comment: (NOTE) SARS-CoV-2 target nucleic acids are NOT DETECTED. The SARS-CoV-2 RNA is generally detectable in  upper and lower respiratory specimens during the acute phase of infection. Negative results do not preclude SARS-CoV-2 infection, do not rule out co-infections with other pathogens, and should not be used as the sole basis for treatment or other patient management decisions. Negative results must be combined with clinical observations, patient history, and epidemiological information. The expected result is Negative. Fact Sheet for Patients: SugarRoll.be Fact Sheet for Healthcare Providers: https://www.woods-mathews.com/ This test is not yet approved or cleared by the [place] FDA and  has been authorized for detection and/or diagnosis of SARS-CoV-2 by FDA under an Emergency Use Authorization (EUA). This EUA will remain  in effect (meaning this test can be used) for the duration of the COVID-19 declaration under Section 56 4(b)(1) of the Act, 21 U.S.C. section 360bbb-3(b)(1), unless the authorization is terminated or revoked sooner. Performed at [facility], [place] [address] [place], [place] [address]   Hemoglobin A1c     Status: Abnormal   Collection Time: 10/27/19  5:22 AM  Result Value Ref Range   Hgb A1c MFr Bld 5.8 (H) 4.8 -  5.6 %    Comment: (NOTE) Pre diabetes:          5.7%-6.4% Diabetes:              >6.4% Glycemic control for   <7.0% adults with diabetes    Mean Plasma Glucose 119.76 mg/dL    Comment: Performed at [facility], [place] [address], [place], [place] [address]  Lipid panel     Status: Abnormal   Collection Time: 10/27/19  5:22 AM  Result Value Ref Range   Cholesterol 361 (H) 0 - 200 mg/dL   Triglycerides 143 <150 mg/dL   HDL 53 >40 mg/dL   Total CHOL/HDL Ratio 6.8 RATIO   VLDL 29 0 - 40 mg/dL   LDL Cholesterol 279 (H) 0 - 99 mg/dL    Comment:        Total Cholesterol/HDL:CHD Risk Coronary Heart Disease Risk Table                     Men   Women  1/2 Average Risk   3.4   3.3  Average Risk        5.0   4.4  2 X Average Risk   9.6   7.1  3 X Average Risk  23.4   11.0        Use the calculated Patient Ratio above and the CHD Risk Table to determine the patient's CHD Risk.        ATP III CLASSIFICATION (LDL):  <100     mg/dL   Optimal  100-129  mg/dL   Near or Above                    Optimal  130-159  mg/dL   Borderline  160-189  mg/dL   High  >190     mg/dL   Very High Performed at [facility], [address] [place], [place] [address]   Basic metabolic panel     Status: Abnormal   Collection Time: 10/28/19 11:09 AM  Result Value Ref Range   Sodium 133 (L) 135 - 145 mmol/L   Potassium 3.9 3.5 - 5.1 mmol/L   Chloride 101 98 - 111 mmol/L   CO2 22 22 - 32 mmol/L   Glucose, Bld 134 (H) 70 - 99 mg/dL   BUN 18 8 - 23 mg/dL   Creatinine, Ser 1.77 (H) 0.44 - 1.00 mg/dL   Calcium 9.4 8.9 - 10.3 mg/dL   GFR calc non Af Amer 28 (L) >60 mL/min   GFR calc Af Amer 32 (L) >60 mL/min   Anion gap 10 5 - 15    Comment: Performed at [facility], [address] [place], [place] [address]  CBC     Status: Abnormal   Collection Time: 10/28/19 11:09 AM  Result Value Ref Range   WBC 8.2 4.0 - 10.5 K/uL   RBC 3.66 (L) 3.87 - 5.11 MIL/uL   Hemoglobin 10.8 (L) 12.0 - 15.0 g/dL   HCT 33.8 (L) 36.0 - 46.0 %   MCV 92.3 80.0 - 100.0 fL   MCH 29.5 26.0 - 34.0 pg   MCHC 32.0 30.0 - 36.0 g/dL   RDW 12.1 11.5 - 15.5 %   Platelets 264 150 - 400 K/uL   nRBC 0.0 0.0 - 0.2 %    Comment: Performed at [facility], [address], [place], [place] [address]  Magnesium     Status: None  Collection Time: 10/28/19 11:09 AM  Result Value Ref Range   Magnesium 1.8 1.7 - 2.4 mg/dL    Comment: Performed at [facility] [address] [place], [place] [address]  ECHOCARDIOGRAM COMPLETE     Status: None   Collection Time: 10/28/19  2:59 PM  Result Value Ref Range   Weight 3,012.37 oz   BP 158/86 mmHg  TSH     Status: None   Collection Time: 11/02/19  4:01  PM  Result Value Ref Range   TSH 3.10 0.40 - 4.50 mIU/L     PHQ2/9: Depression screen [facility] 2/9 12/17/2019 11/02/2019 06/20/2019 06/07/2019 12/18/2018  Decreased Interest 0 0 0 0 0  Down, Depressed, Hopeless 0 0 0 0 0  PHQ - 2 Score 0 0 0 0 0  Altered sleeping 0 0 0 - 0  Tired, decreased energy 0 0 0 - 0  Change in appetite 3 0 0 - 0  Feeling bad or failure about yourself  0 0 0 - 0  Trouble concentrating 0 0 0 - 0  Moving slowly or fidgety/restless 0 0 0 - 0  Suicidal thoughts 0 0 0 - 0  PHQ-9 Score 3 0 0 - 0  Difficult doing work/chores Not difficult at all - Not difficult at all - -  Some recent data might be hidden    phq 9 is negative   Fall Risk: Fall Risk  12/17/2019 11/02/2019 06/20/2019 06/07/2019 12/18/2018  Falls in the past year? 0 0 0 0 0  Number falls in past yr: 0 0 0 0 0  Injury with Fall? 0 0 0 0 0  Risk for fall due to : - - - - -  Risk for fall due to: Comment - - - - -  Follow up Falls evaluation completed - - Falls prevention discussed -      Assessment & Plan  1. Hypothyroidism, postsurgical  - levothyroxine (SYNTHROID) 75 MCG tablet; Take 1 tablet (75 mcg total) by mouth daily before breakfast.  Dispense: 30 tablet; Refill: 5  2. Hypertension, benign  At goal today   3. History of lacunar cerebrovascular accident (CVA)  - atorvastatin (LIPITOR) 40 MG tablet; Take 1 tablet (40 mg total) by mouth daily at 6 PM. Cholesterol medication.  Dispense: 90 tablet; Refill: 1 - clopidogrel (PLAVIX) 75 MG tablet; Take 1 tablet (75 mg total) by mouth daily. Blood thinner to help reduce stroke risk.  Dispense: 90 tablet; Refill: 1  4. 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HCC)  - atorvastatin (LIPITOR) 40 MG tablet; Take 1 tablet (40 mg total) by mouth daily at 6 PM. Cholesterol medication.  Dispense: 90 tablet; Refill: 1 - clopidogrel (PLAVIX) 75 MG tablet; Take 1 tablet (75 mg total) by mouth daily. Blood thinner to help reduce stroke risk.  Dispense: 90 tablet;  Refill: 1  5. Hyponatremia  Under the care of nephrologist   6. Stage 3b chronic kidney disease  Seeing Dr. Abigail Butts  7. Major depression in remission ([place])   8. Angina pectoris (HCC)  - atorvastatin (LIPITOR) 40 MG tablet; Take 1 tablet (40 mg total) by mouth daily at 6 PM. Cholesterol medication.  Dispense: 90 tablet; Refill: 1 - clopidogrel (PLAVIX) 75 MG tablet; Take 1 tablet (75 mg total) by mouth daily. Blood thinner to help reduce stroke risk.  Dispense: 90 tablet; Refill: 1  9. Secondary hyperparathyroidism of renal origin ([place])   10. History of stroke  - atorvastatin (LIPITOR) 40 MG tablet; Take  1 tablet (40 mg total) by mouth daily at 6 PM. Cholesterol medication.  Dispense: 90 tablet; Refill: 1 - clopidogrel (PLAVIX) 75 MG tablet; Take 1 tablet (75 mg total) by mouth daily. Blood thinner to help reduce stroke risk.  Dispense: 90 tablet; Refill: 1  11. Psychophysiological insomnia  - mirtazapine (REMERON) 15 MG tablet; Take 1 tablet (15 mg total) by mouth at bedtime.  Dispense: 30 tablet; Refill: 0

## 2020-01-23 ENCOUNTER — Other Ambulatory Visit: Payer: Self-pay | Admitting: Family Medicine

## 2020-01-23 DIAGNOSIS — I209 Angina pectoris, unspecified: Secondary | ICD-10-CM

## 2020-01-23 DIAGNOSIS — I700 Atherosclerosis of aorta: Secondary | ICD-10-CM

## 2020-01-23 DIAGNOSIS — Z8673 Personal history of transient ischemic attack (TIA), and cerebral infarction without residual deficits: Secondary | ICD-10-CM

## 2020-01-23 MED ORDER — ATORVASTATIN CALCIUM 40 MG PO TABS: 40.0000 mg | ORAL_TABLET | Freq: Every day | ORAL | 1 refills | Status: DC

## 2020-01-23 MED ORDER — CLOPIDOGREL BISULFATE 75 MG PO TABS: 75.0000 mg | ORAL_TABLET | Freq: Every day | ORAL | 1 refills | Status: AC

## 2020-01-23 NOTE — Telephone Encounter (Signed)
Copied from [place] [id number]. Topic: Quick Communication - Rx Refill/Question >> Jan 23, 2020  2:17 PM Izola Price, [place] A wrote: Medication: clopidogrel (PLAVIX) 75 MG tablet,atorvastatin (LIPITOR) 40 MG tablet   Has the patient contacted their pharmacy? yes (Agent: If no, request that the patient contact the pharmacy for the refill.) (Agent: If yes, when and what did the pharmacy advise?)contact PCP  Preferred Pharmacy (with phone number or street name): [facility] [place]  Phone:  [phone] Fax:  [phone]     Agent: Please be advised that RX refills may take up to 3 business days. We ask that you follow-up with your pharmacy.

## 2020-06-10 ENCOUNTER — Ambulatory Visit (INDEPENDENT_AMBULATORY_CARE_PROVIDER_SITE_OTHER): Payer: Medicare Other

## 2020-06-10 DIAGNOSIS — Z0000 Encounter for general adult medical examination without abnormal findings: Secondary | ICD-10-CM

## 2020-06-10 NOTE — Progress Notes (Signed)
Subjective:   Betty Nielsen is a 74 y.o. female who presents for Medicare Annual (Subsequent) preventive examination.  Virtual Visit via Telephone Note  I connected with  Julio Sicks on 06/10/20 at 11:20 AM EDT by telephone and verified that I am speaking with the correct person using two identifiers.  Medicare Annual Wellness visit completed telephonically due to Covid-19 pandemic.   Location: Patient: home Provider: [place]   I discussed the limitations, risks, security and privacy concerns of performing an evaluation and management service by telephone and the availability of in person appointments. The patient expressed understanding and agreed to proceed.  Unable to perform video visit due to video visit attempted and failed and/or patient does not have video capability.   Some vital signs may be absent or patient reported.   Clemetine Marker, LPN   Review of Systems     Cardiac Risk Factors include: advanced age (>13men, >82 women);hypertension;dyslipidemia;obesity (BMI >30kg/m2)     Objective:    There were no vitals filed for this visit. There is no height or weight on file to calculate BMI.  Advanced Directives 06/10/2020 10/25/2019 10/11/2019 06/07/2019 08/29/2018 08/22/2018 08/21/2018  Does Patient Have a Medical Advance Directive? No No;Yes Yes Yes No No No;Yes  Type of Advance Directive - [place];Living will Living will;Healthcare Power of [place];Living will  Does patient want to make changes to medical advance directive? - No - Patient declined No - Patient declined - - - -  Copy of [place] in Chart? - - - No - copy requested No - copy requested - -  Would patient like information on creating a medical advance directive? No - Patient declined - No - Patient declined - No - Patient declined - -    Current Medications (verified) Outpatient Encounter Medications as  of 06/10/2020  Medication Sig   cetirizine (ZYRTEC) 10 MG tablet Take 1 tablet (10 mg total) by mouth daily as needed. PRN only   Cholecalciferol (VITAMIN D-1000 MAX ST) 1000 units tablet Take 1,000 Units by mouth daily.    cloNIDine (CATAPRES) 0.2 MG tablet Take 0.2 mg by mouth 2 (two) times daily.   clopidogrel (PLAVIX) 75 MG tablet Take 75 mg by mouth daily.   irbesartan (AVAPRO) 300 MG tablet Take 1 tablet (300 mg total) by mouth daily.   labetalol (NORMODYNE) 100 MG tablet Take 1 tablet by mouth 2 (two) times daily.    levothyroxine (SYNTHROID) 75 MCG tablet Take 1 tablet (75 mcg total) by mouth daily before breakfast.   nitroGLYCERIN (NITROSTAT) 0.4 MG SL tablet Place under the tongue.   Omega-3 Fatty Acids (FISH OIL) 1000 MG CPDR Take 2 capsules by mouth daily.   atorvastatin (LIPITOR) 40 MG tablet Take 1 tablet (40 mg total) by mouth daily at 6 PM. Cholesterol medication.   [DISCONTINUED] hydrOXYzine (ATARAX/VISTARIL) 25 MG tablet Take by mouth.   [DISCONTINUED] mirtazapine (REMERON) 15 MG tablet Take 1 tablet (15 mg total) by mouth at bedtime.   No facility-administered encounter medications on file as of 06/10/2020.    Allergies (verified) Ace inhibitors, Ciprofloxacin, Citalopram, Hydrocodone-acetaminophen, Norvasc [amlodipine besylate], Pantoprazole sodium, Pravastatin, Sulfa antibiotics, and Clonidine   History: Past Medical History:  Diagnosis Date   Arthrosis of knee    Chronic kidney disease    Hyperlipidemia    Hypertension    Loss of hearing    Metabolic syndrome  Plantar fasciitis    Renal insufficiency    Past Surgical History:  Procedure Laterality Date   ABDOMINAL AORTIC ANEURYSM REPAIR     ABDOMINAL HYSTERECTOMY     APPENDECTOMY     CORONARY STENT INTERVENTION N/A 10/26/2016   Procedure: Coronary Stent Intervention;  Surgeon: Yolonda Kida, MD;  Location: [facility];  Service: Cardiovascular;  Laterality: N/A;    HERNIA REPAIR     INNER EAR SURGERY     pt not sure of type   LEFT HEART CATH AND CORONARY ANGIOGRAPHY N/A 10/26/2016   Procedure: Left Heart Cath and Coronary Angiography;  Surgeon: Teodoro Spray, MD;  Location: [facility];  Service: Cardiovascular;  Laterality: N/A;   THYROIDECTOMY     Family History  Problem Relation Age of Onset   Healthy Mother    Cerebral aneurysm Father    Social History   Socioeconomic History   Marital status: Widowed    Spouse name: Not on file   Number of children: 1   Years of education: GED   Highest education level: 10th grade  Occupational History   Occupation: Retired  Tobacco Use   Smoking status: Former Smoker    Packs/day: 0.50    Years: 20.00    Pack years: 10.00    Types: Cigarettes    Quit date: 2000    Years since quitting: 21.7   Smokeless tobacco: Never Used   Tobacco comment: smoking cessation materials not required  Vaping Use   Vaping Use: Never used  Substance and Sexual Activity   Alcohol use: No    Alcohol/week: 0.0 standard drinks   Drug use: No   Sexual activity: Not Currently  Other Topics Concern   Not on file  Social History Narrative   Lives alone   Retired from working a [employer] Strain: Low Risk    Difficulty of Paying Living Expenses: Not hard at all  Food Insecurity: No Food Insecurity   Worried About Charity fundraiser in the Last Year: Never true   Arboriculturist in the Last Year: Never true  Transportation Needs: No Transportation Needs   Lack of Transportation (Medical): No   Lack of Transportation (Non-Medical): No  Physical Activity: Inactive   Days of Exercise per Week: 0 days   Minutes of Exercise per Session: 0 min  Stress: No Stress Concern Present   Feeling of Stress : Not at all  Social Connections: Moderately Isolated   Frequency of Communication with Friends and Family: More than three times a week     Frequency of Social Gatherings with Friends and Family: Once a week   Attends Religious Services: More than 4 times per year   Active Member of [employer] or Organizations: No   Attends Archivist Meetings: Never   Marital Status: Widowed    Tobacco Counseling Counseling given: Not Answered Comment: smoking cessation materials not required   Clinical Intake:  Pre-visit preparation completed: Yes  Pain : No/denies pain     Nutritional Risks: None Diabetes: No  How often do you need to have someone help you when you read instructions, pamphlets, or other written materials from your doctor or pharmacy?: 1 - Never    Interpreter Needed?: No  Information entered by :: Clemetine Marker LPN   Activities of Daily Living In your present state of health, do you have any difficulty performing the following  activities: 06/10/2020 11/02/2019  Hearing? Y Y  Comment declines hearing aids -  Vision? Y N  Comment - -  Difficulty concentrating or making decisions? N N  Walking or climbing stairs? N N  Dressing or bathing? N N  Doing errands, shopping? N N  Preparing Food and eating ? N -  Using the Toilet? N -  In the past six months, have you accidently leaked urine? N -  Do you have problems with loss of bowel control? N -  Managing your Medications? N -  Managing your Finances? N -  Housekeeping or managing your Housekeeping? N -  Some recent data might be hidden    Patient Care Team: Steele Sizer, MD as PCP - General (Family Medicine) Lavonia Dana, MD as Consulting Physician (Internal Medicine) Yolonda Kida, MD as Consulting Physician (Cardiology) Emmaline Kluver., MD as Consulting Physician (Rheumatology) Angelia Mould, MD as Consulting Physician (Vascular Surgery) Carloyn Manner, MD as Referring Physician (Otolaryngology)  Indicate any recent Medical Services you may have received from other than [facility] providers in the past year (date  may be approximate).     Assessment:   This is a routine wellness examination for Aleanna.  Hearing/Vision screen  Hearing Screening   125Hz  250Hz  500Hz  1000Hz  2000Hz  3000Hz  4000Hz  6000Hz  8000Hz   Right ear:           Left ear:           Comments: Pt c/o hearing difficulty; is only able to hear out of left ear.   Vision Screening Comments: Past due for eye exam, wants to establish care with new provider, declines referral.   Dietary issues and exercise activities discussed: Current Exercise Habits: The patient does not participate in regular exercise at present, Exercise limited by: cardiac condition(s)  Goals     DIET - INCREASE WATER INTAKE     Recommend to drink at least 6-8 8oz glasses of water per day.     Reduce sodium intake     Recommend to continue decreasing sodium intake in daily diet.      Depression Screen PHQ 2/9 Scores 06/10/2020 12/17/2019 11/02/2019 06/20/2019 06/07/2019 12/18/2018 09/12/2018  PHQ - 2 Score 0 0 0 0 0 0 0  PHQ- 9 Score - 3 0 0 - 0 2  Exception Documentation - - - - - - -    Fall Risk Fall Risk  06/10/2020 12/17/2019 11/02/2019 06/20/2019 06/07/2019  Falls in the past year? 0 0 0 0 0  Number falls in past yr: 0 0 0 0 0  Injury with Fall? 0 0 0 0 0  Risk for fall due to : No Fall Risks - - - -  Risk for fall due to: Comment - - - - -  Follow up Falls prevention discussed Falls evaluation completed - - Falls prevention discussed    Any stairs in or around the home? Yes  If so, are there any without handrails? No    Home free of loose throw rugs in walkways, pet beds, electrical cords, etc? Yes  Adequate lighting in your home to reduce risk of falls? Yes   ASSISTIVE DEVICES UTILIZED TO PREVENT FALLS:  Life alert? No  Use of a cane, walker or w/c? No  Grab bars in the bathroom? Yes  Shower chair or bench in shower? No  Elevated toilet seat or a handicapped toilet? No   TIMED UP AND GO:  Was the test performed? No . Telephonic visit.  Cognitive Function: Pt declined 6CIT for 2021 AWV     6CIT Screen 06/07/2019 03/30/2018  What Year? 0 points 0 points  What month? 0 points 0 points  What time? 0 points 0 points  Count back from 20 0 points 0 points  Months in reverse 0 points 0 points  Repeat phrase 0 points 2 points  Total Score 0 2    Immunizations Immunization History  Administered Date(s) Administered   Influenza, High Dose Seasonal PF 06/05/2015, 05/12/2017, 06/03/2019, 07/20/2019   Influenza-Unspecified 06/29/2016, 05/12/2017, 06/02/2020   PFIZER SARS-COV-2 Vaccination 11/12/2019, 11/30/2019   Pneumococcal Conjugate-13 07/17/2014   Pneumococcal Polysaccharide-23 11/24/2015    TDAP status: Due, Education has been provided regarding the importance of this vaccine. Advised may receive this vaccine at local pharmacy or Health Dept. Aware to provide a copy of the vaccination record if obtained from local pharmacy or Health Dept. Verbalized acceptance and understanding.   Flu Vaccine status: Up to date   Pneumococcal vaccine status: Up to date   Covid-19 vaccine status: Completed vaccines  Qualifies for Shingles Vaccine? Yes   Zostavax completed No   Shingrix Completed?: No.    Education has been provided regarding the importance of this vaccine. Patient has been advised to call insurance company to determine out of pocket expense if they have not yet received this vaccine. Advised may also receive vaccine at local pharmacy or Health Dept. Verbalized acceptance and understanding.  Screening Tests Health Maintenance  Topic Date Due   MAMMOGRAM  06/19/2020 (Originally 05/28/1996)   DEXA SCAN  06/19/2020 (Originally 05/29/2011)   COLONOSCOPY  12/16/2020 (Originally 05/28/1996)   INFLUENZA VACCINE  Completed   COVID-19 Vaccine  Completed   Hepatitis C Screening  Completed   PNA vac Low Risk Adult  Completed   TETANUS/TDAP  Discontinued    Health Maintenance  There are no preventive care  reminders to display for this patient.  Colorectal cancer screening: pt declined  Mammogram status: pt declined  Bone density status: pt declined  Lung Cancer Screening: (Low Dose CT Chest recommended if Age 80-80 years, 30 pack-year currently smoking OR have quit w/in 15years.) does not qualify.   Additional Screening:  Hepatitis C Screening: does qualify; Completed 09/03/12  Vision Screening: Recommended annual ophthalmology exams for early detection of glaucoma and other disorders of the eye. Is the patient up to date with their annual eye exam?  No  Who is the provider or what is the name of the office in which the patient attends annual eye exams? Pt plans to establish with new provider If pt is not established with a provider, would they like to be referred to a provider to establish care? No .   Dental Screening: Recommended annual dental exams for proper oral hygiene  Community Resource Referral / Chronic Care Management: CRR required this visit?  No   CCM required this visit?  No      Plan:     I have personally reviewed and noted the following in the patients chart:    Medical and social history  Use of alcohol, tobacco or illicit drugs   Current medications and supplements  Functional ability and status  Nutritional status  Physical activity  Advanced directives  List of other physicians  Hospitalizations, surgeries, and ER visits in previous 12 months  Vitals  Screenings to include cognitive, depression, and falls  Referrals and appointments  In addition, I have reviewed and discussed with patient certain preventive protocols, quality metrics, and  best practice recommendations. A written personalized care plan for preventive services as well as general preventive health recommendations were provided to patient.     Clemetine Marker, LPN   11/11/7979   Nurse Notes: none

## 2020-06-10 NOTE — Patient Instructions (Signed)
Betty Nielsen , Thank you for taking time to come for your Medicare Wellness Visit. I appreciate your ongoing commitment to your health goals. Please review the following plan we discussed and let me know if I can assist you in the future.   Screening recommendations/referrals: Colonoscopy: declined Mammogram: declined Bone Density: declined Recommended yearly ophthalmology/optometry visit for glaucoma screening and checkup Recommended yearly dental visit for hygiene and checkup  Vaccinations: Influenza vaccine: done 06/02/20 Pneumococcal vaccine: done 12/04/15 Tdap vaccine: due Shingles vaccine: Shingrix discussed. Please contact your pharmacy for coverage information.  Covid-19:done 11/12/19 & 11/30/19  Advanced directives: Advance directive discussed with you today. Even though you declined this today please call our office should you change your mind and we can give you the proper paperwork for you to fill out.  Conditions/risks identified: Recommend increasing physical activity   Next appointment: Follow up in one year for your annual wellness visit    Preventive Care 65 Years and Older, Female Preventive care refers to lifestyle choices and visits with your health care provider that can promote health and wellness. What does preventive care include?  A yearly physical exam. This is also called an annual well check.  Dental exams once or twice a year.  Routine eye exams. Ask your health care provider how often you should have your eyes checked.  Personal lifestyle choices, including:  Daily care of your teeth and gums.  Regular physical activity.  Eating a healthy diet.  Avoiding tobacco and drug use.  Limiting alcohol use.  Practicing safe sex.  Taking low-dose aspirin every day.  Taking vitamin and mineral supplements as recommended by your health care provider. What happens during an annual well check? The services and screenings done by your health care provider  during your annual well check will depend on your age, overall health, lifestyle risk factors, and family history of disease. Counseling  Your health care provider may ask you questions about your:  Alcohol use.  Tobacco use.  Drug use.  Emotional well-being.  Home and relationship well-being.  Sexual activity.  Eating habits.  History of falls.  Memory and ability to understand (cognition).  Work and work Statistician.  Reproductive health. Screening  You may have the following tests or measurements:  Height, weight, and BMI.  Blood pressure.  Lipid and cholesterol levels. These may be checked every 5 years, or more frequently if you are over 51 years old.  Skin check.  Lung cancer screening. You may have this screening every year starting at age 43 if you have a 30-pack-year history of smoking and currently smoke or have quit within the past 15 years.  Fecal occult blood test (FOBT) of the stool. You may have this test every year starting at age 13.  Flexible sigmoidoscopy or colonoscopy. You may have a sigmoidoscopy every 5 years or a colonoscopy every 10 years starting at age 55.  Hepatitis C blood test.  Hepatitis B blood test.  Sexually transmitted disease (STD) testing.  Diabetes screening. This is done by checking your blood sugar (glucose) after you have not eaten for a while (fasting). You may have this done every 1-3 years.  Bone density scan. This is done to screen for osteoporosis. You may have this done starting at age 20.  Mammogram. This may be done every 1-2 years. Talk to your health care provider about how often you should have regular mammograms. Talk with your health care provider about your test results, treatment options, and if necessary,  the need for more tests. Vaccines  Your health care provider may recommend certain vaccines, such as:  Influenza vaccine. This is recommended every year.  Tetanus, diphtheria, and acellular pertussis  (Tdap, Td) vaccine. You may need a Td booster every 10 years.  Zoster vaccine. You may need this after age 55.  Pneumococcal 13-valent conjugate (PCV13) vaccine. One dose is recommended after age 34.  Pneumococcal polysaccharide (PPSV23) vaccine. One dose is recommended after age 33. Talk to your health care provider about which screenings and vaccines you need and how often you need them. This information is not intended to replace advice given to you by your health care provider. Make sure you discuss any questions you have with your health care provider. Document Released: 10/03/2015 Document Revised: 05/26/2016 Document Reviewed: 07/08/2015 Elsevier Interactive Patient Education  2017 [place] Prevention in the Home Falls can cause injuries. They can happen to people of all ages. There are many things you can do to make your home safe and to help prevent falls. What can I do on the outside of my home?  Regularly fix the edges of walkways and driveways and fix any cracks.  Remove anything that might make you trip as you walk through a door, such as a raised step or threshold.  Trim any bushes or trees on the path to your home.  Use bright outdoor lighting.  Clear any walking paths of anything that might make someone trip, such as rocks or tools.  Regularly check to see if handrails are loose or broken. Make sure that both sides of any steps have handrails.  Any raised decks and porches should have guardrails on the edges.  Have any leaves, snow, or ice cleared regularly.  Use sand or salt on walking paths during winter.  Clean up any spills in your garage right away. This includes oil or grease spills. What can I do in the bathroom?  Use night lights.  Install grab bars by the toilet and in the tub and shower. Do not use towel bars as grab bars.  Use non-skid mats or decals in the tub or shower.  If you need to sit down in the shower, use a plastic, non-slip  stool.  Keep the floor dry. Clean up any water that spills on the floor as soon as it happens.  Remove soap buildup in the tub or shower regularly.  Attach bath mats securely with double-sided non-slip rug tape.  Do not have throw rugs and other things on the floor that can make you trip. What can I do in the bedroom?  Use night lights.  Make sure that you have a light by your bed that is easy to reach.  Do not use any sheets or blankets that are too big for your bed. They should not hang down onto the floor.  Have a firm chair that has side arms. You can use this for support while you get dressed.  Do not have throw rugs and other things on the floor that can make you trip. What can I do in the kitchen?  Clean up any spills right away.  Avoid walking on wet floors.  Keep items that you use a lot in easy-to-reach places.  If you need to reach something above you, use a strong step stool that has a grab bar.  Keep electrical cords out of the way.  Do not use floor polish or wax that makes floors slippery. If you must use  wax, use non-skid floor wax.  Do not have throw rugs and other things on the floor that can make you trip. What can I do with my stairs?  Do not leave any items on the stairs.  Make sure that there are handrails on both sides of the stairs and use them. Fix handrails that are broken or loose. Make sure that handrails are as long as the stairways.  Check any carpeting to make sure that it is firmly attached to the stairs. Fix any carpet that is loose or worn.  Avoid having throw rugs at the top or bottom of the stairs. If you do have throw rugs, attach them to the floor with carpet tape.  Make sure that you have a light switch at the top of the stairs and the bottom of the stairs. If you do not have them, ask someone to add them for you. What else can I do to help prevent falls?  Wear shoes that:  Do not have high heels.  Have rubber bottoms.  Are  comfortable and fit you well.  Are closed at the toe. Do not wear sandals.  If you use a stepladder:  Make sure that it is fully opened. Do not climb a closed stepladder.  Make sure that both sides of the stepladder are locked into place.  Ask someone to hold it for you, if possible.  Clearly mark and make sure that you can see:  Any grab bars or handrails.  First and last steps.  Where the edge of each step is.  Use tools that help you move around (mobility aids) if they are needed. These include:  Canes.  Walkers.  Scooters.  Crutches.  Turn on the lights when you go into a dark area. Replace any light bulbs as soon as they burn out.  Set up your furniture so you have a clear path. Avoid moving your furniture around.  If any of your floors are uneven, fix them.  If there are any pets around you, be aware of where they are.  Review your medicines with your doctor. Some medicines can make you feel dizzy. This can increase your chance of falling. Ask your doctor what other things that you can do to help prevent falls. This information is not intended to replace advice given to you by your health care provider. Make sure you discuss any questions you have with your health care provider. Document Released: 07/03/2009 Document Revised: 02/12/2016 Document Reviewed: 10/11/2014 Elsevier Interactive Patient Education  2017 [employer].

## 2020-06-18 ENCOUNTER — Other Ambulatory Visit: Payer: Self-pay

## 2020-06-18 ENCOUNTER — Ambulatory Visit (INDEPENDENT_AMBULATORY_CARE_PROVIDER_SITE_OTHER): Payer: Medicare Other | Admitting: Family Medicine

## 2020-06-18 ENCOUNTER — Encounter: Payer: Self-pay | Admitting: Family Medicine

## 2020-06-18 VITALS — BP 126/80 | HR 76 | Temp 98.0°F | Resp 16 | Ht 65.0 in | Wt 174.7 lb

## 2020-06-18 DIAGNOSIS — R739 Hyperglycemia, unspecified: Secondary | ICD-10-CM

## 2020-06-18 DIAGNOSIS — I209 Angina pectoris, unspecified: Secondary | ICD-10-CM

## 2020-06-18 DIAGNOSIS — I10 Essential (primary) hypertension: Secondary | ICD-10-CM

## 2020-06-18 DIAGNOSIS — I700 Atherosclerosis of aorta: Secondary | ICD-10-CM

## 2020-06-18 DIAGNOSIS — E890 Postprocedural hypothyroidism: Secondary | ICD-10-CM

## 2020-06-18 DIAGNOSIS — N2581 Secondary hyperparathyroidism of renal origin: Secondary | ICD-10-CM

## 2020-06-18 DIAGNOSIS — N1832 Chronic kidney disease, stage 3b: Secondary | ICD-10-CM

## 2020-06-18 DIAGNOSIS — N183 Chronic kidney disease, stage 3 unspecified: Secondary | ICD-10-CM

## 2020-06-18 DIAGNOSIS — F325 Major depressive disorder, single episode, in full remission: Secondary | ICD-10-CM

## 2020-06-18 DIAGNOSIS — I129 Hypertensive chronic kidney disease with stage 1 through stage 4 chronic kidney disease, or unspecified chronic kidney disease: Secondary | ICD-10-CM

## 2020-06-18 DIAGNOSIS — E785 Hyperlipidemia, unspecified: Secondary | ICD-10-CM

## 2020-06-18 DIAGNOSIS — Z8673 Personal history of transient ischemic attack (TIA), and cerebral infarction without residual deficits: Secondary | ICD-10-CM

## 2020-06-18 MED ORDER — IRBESARTAN 300 MG PO TABS: 300.0000 mg | ORAL_TABLET | Freq: Every day | ORAL | 1 refills | Status: DC

## 2020-06-18 MED ORDER — LEVOTHYROXINE SODIUM 75 MCG PO TABS: 75.0000 ug | ORAL_TABLET | Freq: Every day | ORAL | 1 refills | Status: DC

## 2020-06-18 MED ORDER — ATORVASTATIN CALCIUM 40 MG PO TABS: 40.0000 mg | ORAL_TABLET | Freq: Every day | ORAL | 1 refills | Status: DC

## 2020-06-18 NOTE — Progress Notes (Signed)
Name: Betty Nielsen   MRN: [id number]    DOB: Jan 27, 1946   Date:06/18/2020       Progress Note  Subjective  Chief Complaint Follow up/HTN  Chief Complaint  Patient presents with  . Follow-up  . Hypertension    HPI   CKI III/ Hyponatremia:stable kidney function when looked at labs done at [facility] 05/2020 by Dr. Abigail Butts and GFR was stable at 34  Good urine output, she denies pruritus, no nausea or vomiting.She has secondary hyperparathyroidism and also anemia of chronic disease.   HTN/CAD/Angina/Stroke:bp today is in good control, taking bp medications as prescribed. She has not used any NTG since Feb 2018 ( when she had MI).She had a Stroke 10/2019 , she is back on  plavix and is taking statin therapy and denies side effects. She states very seldom left arm feels tired, but continues to have some weird feeling in her mouth when she eats bread and beef appetite is finally back to normal .   Atherosclerosis abdominal aorta/hyperlipidemia: she has been taking Atorvastatin and plavix, no longer having problems with statin therapy,  history of aneurysmal of aorta repair done by Dr. Doren Custard ( vascular surgeon in [place]).   Hyperlipidemia: since 10/2019 she has been taking Atorvastatin and denies side effects of medication   Hypothyroidism: she is s/p thyroidectomy Compliant with medication, denies dry skin, states constipation under control with bran flakes for breakfast,  denies hair loss . 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 She denies sadness, crying spells, change in appetite, or suicidal thoughts.Phq 9 today today is zero, unchanged   Hyperglycemia: we will recheck A1C, she denies polyphagia, polydipsia or polyuria   Patient Active Problem List   Diagnosis Date Noted  .  Benign hypertension with chronic kidney disease, stage III ([place]) 06/18/2020  . Anemia in chronic kidney disease 11/21/2019  . Stage 3b chronic kidney disease 11/21/2019  . Hypo-osmolality and hyponatremia 11/21/2019  . Secondary hyperparathyroidism of renal origin ([place]) 11/21/2019  . History of stroke 10/27/2019  . PMR (polymyalgia rheumatica) ([place]) 06/15/2017  . Headache 10/27/2016  . CKD (chronic kidney disease), stage III 10/27/2016  . Hyperlipidemia 10/27/2016  . Hyponatremia 10/27/2016  . Angina pectoris ([place]) 10/25/2016  . History of non-ST elevation myocardial infarction (NSTEMI) 10/25/2016  . History of coronary angioplasty 10/25/2016  . BPV (benign positional vertigo) 06/15/2016  . 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[place]) 11/21/2015  . Acid reflux 06/05/2015  . Hypertension, benign 06/05/2015  . Hypothyroidism, 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  . Major depression in remission ([place]) 06/05/2015  . Obesity (BMI 30.0-34.9) 06/05/2015  . Neuropathy 06/05/2015  . Arthritis, degenerative 07/03/2014  . AAA (abdominal aortic aneurysm) without rupture ([place]) 06/26/2014    Past Surgical History:  Procedure Laterality Date  . ABDOMINAL AORTIC ANEURYSM REPAIR    . ABDOMINAL HYSTERECTOMY    . APPENDECTOMY    . CORONARY STENT INTERVENTION N/A 10/26/2016   Procedure: Coronary Stent Intervention;  Surgeon: Yolonda Kida, MD;  Location: [facility];  Service: Cardiovascular;  Laterality: N/A;  . HERNIA REPAIR    . INNER EAR SURGERY     pt not sure of type  . LEFT HEART CATH AND CORONARY ANGIOGRAPHY N/A 10/26/2016   Procedure: Left Heart Cath and Coronary Angiography;  Surgeon: Javier Docker  Ubaldo Glassing, MD;  Location: [facility];  Service: Cardiovascular;  Laterality: N/A;  . THYROIDECTOMY      Family History  Problem Relation Age of Onset  . Healthy Mother   . Cerebral aneurysm Father     Social History   Tobacco Use  . Smoking status: Former Smoker    Packs/day: 0.50     Years: 20.00    Pack years: 10.00    Types: Cigarettes    Quit date: 2000    Years since quitting: 21.7  . Smokeless tobacco: Never Used  . Tobacco comment: smoking cessation materials not required  Substance Use Topics  . Alcohol use: No    Alcohol/week: 0.0 standard drinks     Current Outpatient Medications:  .  cetirizine (ZYRTEC) 10 MG tablet, Take 1 tablet (10 mg total) by mouth daily as needed. PRN only, Disp: , Rfl:  .  Cholecalciferol (VITAMIN D-1000 MAX ST) 1000 units tablet, Take 1,000 Units by mouth daily. , Disp: , Rfl:  .  cloNIDine (CATAPRES) 0.2 MG tablet, Take 0.2 mg by mouth 2 (two) times daily., Disp: , Rfl:  .  clopidogrel (PLAVIX) 75 MG tablet, Take 75 mg by mouth daily., Disp: , Rfl:  .  irbesartan (AVAPRO) 300 MG tablet, Take 1 tablet (300 mg total) by mouth daily., Disp: 90 tablet, Rfl: 1 .  labetalol (NORMODYNE) 100 MG tablet, Take 1 tablet by mouth 2 (two) times daily. , Disp: , Rfl:  .  levothyroxine (SYNTHROID) 75 MCG tablet, Take 1 tablet (75 mcg total) by mouth daily before breakfast., Disp: 90 tablet, Rfl: 1 .  nitroGLYCERIN (NITROSTAT) 0.4 MG SL tablet, Place under the tongue., Disp: , Rfl:  .  Omega-3 Fatty Acids (FISH OIL) 1000 MG CPDR, Take 2 capsules by mouth daily., Disp: , Rfl:  .  atorvastatin (LIPITOR) 40 MG tablet, Take 1 tablet (40 mg total) by mouth daily at 6 PM. Cholesterol medication., Disp: 90 tablet, Rfl: 1  Allergies  Allergen Reactions  . Ace Inhibitors Cough  . Ciprofloxacin Other (See Comments)    unknown  . Citalopram     hyponatremia  . Hydrocodone-Acetaminophen Other (See Comments)    unknown  . Norvasc [Amlodipine Besylate]     Pruritus   . Pantoprazole Sodium Other (See Comments)    Cramps  . Pravastatin Other (See Comments)    Stomach cramps  . Sulfa Antibiotics     unknown  . Clonidine Other (See Comments)    Per patient told by allergist that would be best to stop but nephrologist recommended her to stay on  medication     I personally reviewed active problem list, medication list, allergies, family history, social history, health maintenance with the patient/caregiver today.   ROS  Constitutional: Negative for fever or weight change.  Respiratory: Negative for cough and shortness of breath.   Cardiovascular: Negative for chest pain or palpitations.  Gastrointestinal: Negative for abdominal pain, no bowel changes.  Musculoskeletal: Negative for gait problem or joint swelling.  Skin: Negative for rash.  Neurological: Negative for dizziness or headache.  No other specific complaints in a complete review of systems (except as listed in HPI above).  Objective  Vitals:   06/18/20 1329  BP: 126/80  Pulse: 76  Resp: 16  Temp: 98 F (36.7 C)  TempSrc: Oral  SpO2: 99%  Weight: 174 lb 11.2 oz (79.2 kg)  Height: 5\' 5"  (1.651 m)    Body mass index is 29.07 kg/m.  Physical Exam  Constitutional: Patient appears well-developed and well-nourished. No distress.  HEENT: head atraumatic, normocephalic, pupils equal and reactive to light,  neck supple Cardiovascular: Normal rate, regular rhythm and normal heart sounds.  No murmur heard. No BLE edema. Pulmonary/Chest: Effort normal and breath sounds normal. No respiratory distress. Abdominal: Soft.  There is no tenderness. Psychiatric: Patient has a normal mood and affect. behavior is normal. Judgment and thought content normal.  PHQ2/9: Depression screen [facility] 2/9 06/18/2020 06/10/2020 12/17/2019 11/02/2019 06/20/2019  Decreased Interest 0 0 0 0 0  Down, Depressed, Hopeless 0 0 0 0 0  PHQ - 2 Score 0 0 0 0 0  Altered sleeping - - 0 0 0  Tired, decreased energy - - 0 0 0  Change in appetite - - 3 0 0  Feeling bad or failure about yourself  - - 0 0 0  Trouble concentrating - - 0 0 0  Moving slowly or fidgety/restless - - 0 0 0  Suicidal thoughts - - 0 0 0  PHQ-9 Score - - 3 0 0  Difficult doing work/chores - - Not difficult at all - Not  difficult at all  Some recent data might be hidden    phq 9 is negative   Fall Risk: Fall Risk  06/18/2020 06/10/2020 12/17/2019 11/02/2019 06/20/2019  Falls in the past year? 0 0 0 0 0  Number falls in past yr: 0 0 0 0 0  Injury with Fall? 0 0 0 0 0  Risk for fall due to : - No Fall Risks - - -  Risk for fall due to: Comment - - - - -  Follow up - Falls prevention discussed Falls evaluation completed - -     Functional Status Survey: Is the patient deaf or have difficulty hearing?: Yes Does the patient have difficulty seeing, even when wearing glasses/contacts?: Yes Does the patient have difficulty concentrating, remembering, or making decisions?: No Does the patient have difficulty walking or climbing stairs?: No Does the patient have difficulty dressing or bathing?: No Does the patient have difficulty doing errands alone such as visiting a doctor's office or shopping?: No    Assessment & Plan  1. History of lacunar cerebrovascular accident (CVA)  - atorvastatin (LIPITOR) 40 MG tablet; Take 1 tablet (40 mg total) by mouth daily at 6 PM. Cholesterol medication.  Dispense: 90 tablet; Refill: 1 - Lipid panel  2. 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HCC)  - atorvastatin (LIPITOR) 40 MG tablet; Take 1 tablet (40 mg total) by mouth daily at 6 PM. Cholesterol medication.  Dispense: 90 tablet; Refill: 1 - Lipid panel  3. Angina pectoris (HCC)  - atorvastatin (LIPITOR) 40 MG tablet; Take 1 tablet (40 mg total) by mouth daily at 6 PM. Cholesterol medication.  Dispense: 90 tablet; Refill: 1  4. Hypertension, benign  - irbesartan (AVAPRO) 300 MG tablet; Take 1 tablet (300 mg total) by mouth daily.  Dispense: 90 tablet; Refill: 1  5. Hypothyroidism, postsurgical  - levothyroxine (SYNTHROID) 75 MCG tablet; Take 1 tablet (75 mcg total) by mouth daily before breakfast.  Dispense: 90 tablet; Refill: 1 - TSH  6. Stage 3b chronic kidney disease (HCC)  - VITAMIN D 25 Hydroxy (Vit-D  Deficiency, Fractures)  - COMPLETE METABOLIC PANEL WITH GFR  7. History of CVA (cerebrovascular accident)   8. Secondary hyperparathyroidism of renal origin ([place])   9. Major depression in remission ([place])   10. Dyslipidemia  - Lipid panel  11. Hyperglycemia  -  Hemoglobin A1c  12. Benign hypertension with chronic kidney disease, stage III (HCC)  - COMPLETE METABOLIC PANEL WITH GFR

## 2020-06-19 LAB — LIPID PANEL
Cholesterol: 185 mg/dL (ref <200)
HDL: 63 mg/dL (ref >=50)
LDL Cholesterol (Calc): 104 mg/dL (calc) — ABNORMAL HIGH
Non-HDL Cholesterol (Calc): 122 mg/dL (calc) (ref <130)
Total CHOL/HDL Ratio: 2.9 (calc) (ref <5.0)
Triglycerides: 88 mg/dL (ref <150)

## 2020-06-19 LAB — COMPLETE METABOLIC PANEL WITH GFR
AG Ratio: 1.7 (calc) (ref 1.0–2.5)
ALT: 9 U/L (ref 6–29)
AST: 18 U/L (ref 10–35)
Albumin: 3.8 g/dL (ref 3.6–5.1)
Alkaline phosphatase (APISO): 78 U/L (ref 37–153)
BUN/Creatinine Ratio: 10 (calc) (ref 6–22)
BUN: 18 mg/dL (ref 7–25)
CO2: 27 mmol/L (ref 20–32)
Calcium: 9.6 mg/dL (ref 8.6–10.4)
Chloride: 105 mmol/L (ref 98–110)
Creat: 1.82 mg/dL — ABNORMAL HIGH (ref 0.60–0.93)
GFR, Est African American: 31 mL/min/{1.73_m2} — ABNORMAL LOW (ref >=60)
GFR, Est Non African American: 27 mL/min/{1.73_m2} — ABNORMAL LOW (ref >=60)
Globulin: 2.2 g/dL (calc) (ref 1.9–3.7)
Glucose, Bld: 109 mg/dL — ABNORMAL HIGH (ref 65–99)
Potassium: 5.2 mmol/L (ref 3.5–5.3)
Sodium: 138 mmol/L (ref 135–146)
Total Bilirubin: 0.5 mg/dL (ref 0.2–1.2)
Total Protein: 6 g/dL — ABNORMAL LOW (ref 6.1–8.1)

## 2020-06-19 LAB — HEMOGLOBIN A1C
Hgb A1c MFr Bld: 5.8 % of total Hgb — ABNORMAL HIGH (ref <5.7)
Mean Plasma Glucose: 120 (calc)
eAG (mmol/L): 6.6 (calc)

## 2020-06-19 LAB — TSH: TSH: 0.55 mIU/L (ref 0.40–4.50)

## 2020-06-19 LAB — VITAMIN D 25 HYDROXY (VIT D DEFICIENCY, FRACTURES): Vit D, 25-Hydroxy: 79 ng/mL (ref 30–100)

## 2020-12-15 NOTE — Progress Notes (Signed)
Name: Betty Nielsen   MRN: [id number]    DOB: Aug 30, 1946   Date:12/16/2020       Progress Note  Subjective  Chief Complaint  Follow Up  HPI  CKI III/ Hyponatremia:she is under the care of Dr. Abigail Butts, HCT was 30, Pth was 63, GFR 30  Good urine output, she denies pruritus, no nausea or vomiting.She has secondary hyperparathyroidism and also anemia of chronic disease. She restricts water intake to a max of 24-30 oz daily max due to control hyponatremia   HTN/CAD/Angina/Stroke:bp today is in good control, taking bp medications as prescribed,  palpitation or dizziness, no recent episodes of chest pain . She has not used any NTG since Feb 2018 ( when she had MI).She had a Stroke 10/2019 , she is back on  plavix and is taking statin therapy and denies side effects.  Atherosclerosis abdominal aorta/hyperlipidemia: she has been taking Atorvastatin and plavix, no longer having problems with statin therapy,  history of aneurysmal of aorta repair done by Dr. Doren Custard ( vascular surgeon in [place]). Unchanged   Hyperlipidemia: since 10/2019 she has been taking Atorvastatin and denies side effects of medication last LDL was down from 226 to 104. Explained goal is below 21, but she does not want to adjust dose at this time  Hypothyroidism: she is s/p thyroidectomy Compliant with medication, denies dry skin, states constipation under control with dietary modification and increase in fiber diet. . 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 She denies sadness, crying spells, change in appetite, or suicidal thoughts.Phq 9 today today is zero, she has been in remission   Last A1C was 5.8 %. She denies polyphagia, polydipsia or polyuria   AR: she has year round symptoms, but worse this time of the year, having more sneezing,  rhinorrhea and post-nasal drainage, cannot tolerate nasal steroid, willing to try adding singulair at night   Patient Active Problem List   Diagnosis Date Noted  . Benign hypertension with chronic kidney disease, stage III ([place]) 06/18/2020  . Anemia in chronic kidney disease 11/21/2019  . Stage 3b chronic kidney disease ([place]) 11/21/2019  . Hypo-osmolality and hyponatremia 11/21/2019  . Secondary hyperparathyroidism of renal origin ([place]) 11/21/2019  . History of stroke 10/27/2019  . PMR (polymyalgia rheumatica) ([place]) 06/15/2017  . Headache 10/27/2016  . CKD (chronic kidney disease), stage III ([place]) 10/27/2016  . Hyperlipidemia 10/27/2016  . Hyponatremia 10/27/2016  . Angina pectoris ([place]) 10/25/2016  . History of non-ST elevation myocardial infarction (NSTEMI) 10/25/2016  . History of coronary angioplasty 10/25/2016  . BPV (benign positional vertigo) 06/15/2016  . 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[place]) 11/21/2015  . Acid reflux 06/05/2015  . Hypertension, benign 06/05/2015  . Hypothyroidism, 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  . Major depression in remission ([place]) 06/05/2015  . Obesity (BMI 30.0-34.9) 06/05/2015  . Neuropathy 06/05/2015  . Arthritis, degenerative 07/03/2014  . AAA (abdominal aortic aneurysm) without rupture ([place]) 06/26/2014    Past Surgical History:  Procedure Laterality Date  . ABDOMINAL AORTIC ANEURYSM REPAIR    . ABDOMINAL HYSTERECTOMY    . APPENDECTOMY    . CORONARY STENT INTERVENTION N/A 10/26/2016   Procedure: Coronary Stent Intervention;  Surgeon: Yolonda Kida, MD;  Location: [facility];  Service: Cardiovascular;  Laterality: N/A;  . HERNIA REPAIR    . INNER EAR  SURGERY     pt not sure of type  . LEFT HEART CATH AND CORONARY ANGIOGRAPHY N/A 10/26/2016   Procedure: Left Heart Cath and Coronary Angiography;  Surgeon: Teodoro Spray, MD;  Location: [facility];  Service: Cardiovascular;  Laterality: N/A;  . THYROIDECTOMY      Family History   Problem Relation Age of Onset  . Healthy Mother   . Cerebral aneurysm Father     Social History   Tobacco Use  . Smoking status: Former Smoker    Packs/day: 0.50    Years: 20.00    Pack years: 10.00    Types: Cigarettes    Quit date: 2000    Years since quitting: 22.2  . Smokeless tobacco: Never Used  . Tobacco comment: smoking cessation materials not required  Substance Use Topics  . Alcohol use: No    Alcohol/week: 0.0 standard drinks     Current Outpatient Medications:  .  cetirizine (ZYRTEC) 10 MG tablet, Take 1 tablet (10 mg total) by mouth daily as needed. PRN only, Disp: , Rfl:  .  Cholecalciferol 25 MCG (1000 UT) tablet, Take 1,000 Units by mouth daily. , Disp: , Rfl:  .  cloNIDine (CATAPRES) 0.2 MG tablet, Take 0.2 mg by mouth 2 (two) times daily., Disp: , Rfl:  .  clopidogrel (PLAVIX) 75 MG tablet, Take 75 mg by mouth daily., Disp: , Rfl:  .  irbesartan (AVAPRO) 300 MG tablet, Take 1 tablet (300 mg total) by mouth daily., Disp: 90 tablet, Rfl: 1 .  labetalol (NORMODYNE) 100 MG tablet, Take 1 tablet by mouth 2 (two) times daily. , Disp: , Rfl:  .  levothyroxine (SYNTHROID) 75 MCG tablet, Take 1 tablet (75 mcg total) by mouth daily before breakfast., Disp: 90 tablet, Rfl: 1 .  nitroGLYCERIN (NITROSTAT) 0.4 MG SL tablet, Place under the tongue., Disp: , Rfl:  .  Omega-3 Fatty Acids (FISH OIL) 1000 MG CPDR, Take 2 capsules by mouth daily., Disp: , Rfl:  .  atorvastatin (LIPITOR) 40 MG tablet, Take 1 tablet (40 mg total) by mouth daily at 6 PM. Cholesterol medication., Disp: 90 tablet, Rfl: 1  Allergies  Allergen Reactions  . Ace Inhibitors Cough  . Ciprofloxacin Other (See Comments)    unknown  . Citalopram     hyponatremia  . Hydrocodone-Acetaminophen Other (See Comments)    unknown  . Norvasc [Amlodipine Besylate]     Pruritus   . Pantoprazole Sodium Other (See Comments)    Cramps  . Pravastatin Other (See Comments)    Stomach cramps  . Sulfa Antibiotics      unknown  . Clonidine Other (See Comments)    Per patient told by allergist that would be best to stop but nephrologist recommended her to stay on medication     I personally reviewed active problem list, medication list, allergies, family history, social history, health maintenance with the patient/caregiver today.   ROS  Constitutional: Negative for fever or weight change.  Respiratory: Negative for cough and shortness of breath.   Cardiovascular: Negative for chest pain or palpitations.  Gastrointestinal: Negative for abdominal pain, no bowel changes.  Musculoskeletal: Negative for gait problem or joint swelling.  Skin: Negative for rash.  Neurological: Negative for dizziness or headache.  No other specific complaints in a complete review of systems (except as listed in HPI above).  Objective  Vitals:   12/16/20 1350  BP: 126/78  Pulse: 73  Resp: 16  Temp: (!) 97.5 F (  36.4 C)  TempSrc: Oral  SpO2: 99%  Weight: 179 lb (81.2 kg)  Height: '5\' 5"'$  (1.651 m)    Body mass index is 29.79 kg/m.  Physical Exam  Constitutional: Patient appears well-developed and well-nourished.  No distress.  HEENT: head atraumatic, normocephalic, pupils equal and reactive to light,  neck supple Cardiovascular: Normal rate, regular rhythm and normal heart sounds.  No murmur heard. No BLE edema. Pulmonary/Chest: Effort normal and breath sounds normal. No respiratory distress. Abdominal: Soft.  There is no tenderness. Psychiatric: Patient has a normal mood and affect. behavior is normal. Judgment and thought content normal.  PHQ2/9: Depression screen [facility] 2/9 12/16/2020 06/18/2020 06/10/2020 12/17/2019 11/02/2019  Decreased Interest 0 0 0 0 0  Down, Depressed, Hopeless 0 0 0 0 0  PHQ - 2 Score 0 0 0 0 0  Altered sleeping 0 - - 0 0  Tired, decreased energy 0 - - 0 0  Change in appetite 0 - - 3 0  Feeling bad or failure about yourself  0 - - 0 0  Trouble concentrating 0 - - 0 0  Moving slowly  or fidgety/restless 0 - - 0 0  Suicidal thoughts 0 - - 0 0  PHQ-9 Score 0 - - 3 0  Difficult doing work/chores - - - Not difficult at all -  Some recent data might be hidden    phq 9 is negative   Fall Risk: Fall Risk  12/16/2020 06/18/2020 06/10/2020 12/17/2019 11/02/2019  Falls in the past year? 0 0 0 0 0  Number falls in past yr: 0 0 0 0 0  Injury with Fall? 0 0 0 0 0  Risk for fall due to : - - No Fall Risks - -  Risk for fall due to: Comment - - - - -  Follow up - - Falls prevention discussed Falls evaluation completed -     Functional Status Survey: Is the patient deaf or have difficulty hearing?: Yes Does the patient have difficulty seeing, even when wearing glasses/contacts?: No Does the patient have difficulty concentrating, remembering, or making decisions?: No Does the patient have difficulty walking or climbing stairs?: No Does the patient have difficulty dressing or bathing?: No Does the patient have difficulty doing errands alone such as visiting a doctor's office or shopping?: No    Assessment & Plan  1. 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HCC)  - atorvastatin (LIPITOR) 40 MG tablet; Take 1 tablet (40 mg total) by mouth daily at 6 PM. Cholesterol medication.  Dispense: 90 tablet; Refill: 1  2. History of lacunar cerebrovascular accident (CVA)  - atorvastatin (LIPITOR) 40 MG tablet; Take 1 tablet (40 mg total) by mouth daily at 6 PM. Cholesterol medication.  Dispense: 90 tablet; Refill: 1  3. Osteoporosis screening  Refuses to repeat bone density   4. Dyslipidemia   5. Hypertension, benign  - irbesartan (AVAPRO) 300 MG tablet; Take 1 tablet (300 mg total) by mouth daily.  Dispense: 90 tablet; Refill: 1  6. Angina pectoris (HCC)  - atorvastatin (LIPITOR) 40 MG tablet; Take 1 tablet (40 mg total) by mouth daily at 6 PM. Cholesterol medication.  Dispense: 90 tablet; Refill: 1  7. Stage 3b chronic kidney disease ([place])  Keep follow up with Dr. Abigail Butts    8. Breast cancer screening by mammogram  Refused it   9. Major depression in remission ([place])   10. Hypothyroidism, postsurgical  - levothyroxine (SYNTHROID) 75 MCG tablet; Take 1 tablet (75 mcg total) by  mouth daily before breakfast.  Dispense: 90 tablet; Refill: 1  11. Secondary hyperparathyroidism of renal origin ([place])   12. Hyperglycemia   13. Benign hypertension with chronic kidney disease, stage III ([place])   14. Perennial allergic rhinitis with seasonal variation  - montelukast (SINGULAIR) 10 MG tablet; Take 1 tablet (10 mg total) by mouth at bedtime.  Dispense: 30 tablet; Refill: 3  15. Angina pectoris (HCC)  - atorvastatin (LIPITOR) 40 MG tablet; Take 1 tablet (40 mg total) by mouth daily at 6 PM. Cholesterol medication.  Dispense: 90 tablet; Refill: 1

## 2020-12-16 ENCOUNTER — Ambulatory Visit (INDEPENDENT_AMBULATORY_CARE_PROVIDER_SITE_OTHER): Payer: Medicare Other | Admitting: Family Medicine

## 2020-12-16 ENCOUNTER — Encounter: Payer: Self-pay | Admitting: Family Medicine

## 2020-12-16 ENCOUNTER — Other Ambulatory Visit: Payer: Self-pay

## 2020-12-16 VITALS — BP 126/78 | HR 73 | Temp 97.5°F | Resp 16 | Ht 65.0 in | Wt 179.0 lb

## 2020-12-16 DIAGNOSIS — I700 Atherosclerosis of aorta: Secondary | ICD-10-CM

## 2020-12-16 DIAGNOSIS — N183 Chronic kidney disease, stage 3 unspecified: Secondary | ICD-10-CM

## 2020-12-16 DIAGNOSIS — Z13820 Encounter for screening for osteoporosis: Secondary | ICD-10-CM | POA: Diagnosis not present

## 2020-12-16 DIAGNOSIS — I10 Essential (primary) hypertension: Secondary | ICD-10-CM

## 2020-12-16 DIAGNOSIS — E785 Hyperlipidemia, unspecified: Secondary | ICD-10-CM | POA: Diagnosis not present

## 2020-12-16 DIAGNOSIS — N2581 Secondary hyperparathyroidism of renal origin: Secondary | ICD-10-CM

## 2020-12-16 DIAGNOSIS — Z8673 Personal history of transient ischemic attack (TIA), and cerebral infarction without residual deficits: Secondary | ICD-10-CM

## 2020-12-16 DIAGNOSIS — E890 Postprocedural hypothyroidism: Secondary | ICD-10-CM

## 2020-12-16 DIAGNOSIS — J3089 Other allergic rhinitis: Secondary | ICD-10-CM

## 2020-12-16 DIAGNOSIS — R739 Hyperglycemia, unspecified: Secondary | ICD-10-CM

## 2020-12-16 DIAGNOSIS — I129 Hypertensive chronic kidney disease with stage 1 through stage 4 chronic kidney disease, or unspecified chronic kidney disease: Secondary | ICD-10-CM

## 2020-12-16 DIAGNOSIS — J302 Other seasonal allergic rhinitis: Secondary | ICD-10-CM

## 2020-12-16 DIAGNOSIS — F325 Major depressive disorder, single episode, in full remission: Secondary | ICD-10-CM

## 2020-12-16 DIAGNOSIS — I209 Angina pectoris, unspecified: Secondary | ICD-10-CM

## 2020-12-16 DIAGNOSIS — N1832 Chronic kidney disease, stage 3b: Secondary | ICD-10-CM

## 2020-12-16 DIAGNOSIS — Z1231 Encounter for screening mammogram for malignant neoplasm of breast: Secondary | ICD-10-CM

## 2020-12-16 MED ORDER — MONTELUKAST SODIUM 10 MG PO TABS: 10.0000 mg | ORAL_TABLET | Freq: Every day | ORAL | 3 refills | Status: DC

## 2020-12-16 MED ORDER — ATORVASTATIN CALCIUM 40 MG PO TABS: 40.0000 mg | ORAL_TABLET | Freq: Every day | ORAL | 1 refills | Status: DC

## 2020-12-16 MED ORDER — IRBESARTAN 300 MG PO TABS: 300.0000 mg | ORAL_TABLET | Freq: Every day | ORAL | 1 refills | Status: DC

## 2020-12-16 MED ORDER — LEVOTHYROXINE SODIUM 75 MCG PO TABS: 75.0000 ug | ORAL_TABLET | Freq: Every day | ORAL | 1 refills | Status: DC

## 2021-01-08 ENCOUNTER — Other Ambulatory Visit: Payer: Self-pay | Admitting: Family Medicine

## 2021-01-08 NOTE — Telephone Encounter (Signed)
Requested medication (s) are due for refill today: yes  Requested medication (s) are on the active medication list: yes  Last refill:  10/12/20  Future visit scheduled: yes  Notes to clinic:  historical provider    Requested Prescriptions  Pending Prescriptions Disp Refills   clopidogrel (PLAVIX) 75 MG tablet [Pharmacy Med Name: Clopidogrel Bisulfate 75 MG Oral Tablet] 90 tablet 0    Sig: TAKE 1 TABLET BY MOUTH ONCE DAILY. BLOOD THINNER TO HELP REDUCE STROKE RISK      Hematology: Antiplatelets - clopidogrel Failed - 01/08/2021  2:14 PM      Failed - Evaluate AST, ALT within 2 months of therapy initiation.      Failed - HCT in normal range and within 180 days    HCT  Date Value Ref Range Status  10/28/2019 33.8 (L) 36.0 - 46.0 % Final  04/04/2012 33.8 (L) 35.0 - 47.0 % Final          Failed - HGB in normal range and within 180 days    Hemoglobin  Date Value Ref Range Status  10/28/2019 10.8 (L) 12.0 - 15.0 g/dL Final   HGB  Date Value Ref Range Status  04/04/2012 11.3 (L) 12.0 - 16.0 g/dL Final          Failed - PLT in normal range and within 180 days    Platelets  Date Value Ref Range Status  10/28/2019 264 150 - 400 K/uL Final   Platelet  Date Value Ref Range Status  04/04/2012 323 150 - 440 x10 3/mm 3 Final          Passed - ALT in normal range and within 360 days    ALT  Date Value Ref Range Status  06/18/2020 9 6 - 29 U/L Final   SGPT (ALT)  Date Value Ref Range Status  04/04/2012 15 U/L Final    Comment:    12-78 NOTE: NEW REFERENCE RANGE 08/13/2011           Passed - AST in normal range and within 360 days    AST  Date Value Ref Range Status  06/18/2020 18 10 - 35 U/L Final   SGOT(AST)  Date Value Ref Range Status  04/04/2012 22 15 - 37 Unit/L Final          Passed - Valid encounter within last 6 months    Recent Outpatient Visits           3 weeks ago Atherosclerosis of abdominal aorta [facility])   [facility]  Steele Sizer, MD   6 months ago Angina pectoris [facility])   [facility] Steele Sizer, MD   1 year ago Hypertension, benign   [facility] Steele Sizer, MD   1 year ago Hospital discharge follow-up   [facility] Steele Sizer, MD   1 year ago PMR (polymyalgia rheumatica) [facility])   [facility] Steele Sizer, MD       Future Appointments             In 5 months  [facility] [place], [place]   In 5 months Steele Sizer, MD [facility], [facility]

## 2021-06-06 ENCOUNTER — Inpatient Hospital Stay
Admission: EM | Admit: 2021-06-06 | Discharge: 2021-06-17 | DRG: 252 | Disposition: A | Discharge status: Home / Self Care | Payer: Medicare Other | Attending: Internal Medicine | Admitting: Internal Medicine

## 2021-06-06 ENCOUNTER — Encounter: Payer: Self-pay | Admitting: Emergency Medicine

## 2021-06-06 ENCOUNTER — Emergency Department: Payer: Medicare Other

## 2021-06-06 ENCOUNTER — Other Ambulatory Visit: Payer: Self-pay

## 2021-06-06 DIAGNOSIS — Z955 Presence of coronary angioplasty implant and graft: Secondary | ICD-10-CM

## 2021-06-06 DIAGNOSIS — D638 Anemia in other chronic diseases classified elsewhere: Secondary | ICD-10-CM

## 2021-06-06 DIAGNOSIS — Z881 Allergy status to other antibiotic agents status: Secondary | ICD-10-CM

## 2021-06-06 DIAGNOSIS — I700 Atherosclerosis of aorta: Secondary | ICD-10-CM | POA: Diagnosis present

## 2021-06-06 DIAGNOSIS — N189 Chronic kidney disease, unspecified: Secondary | ICD-10-CM

## 2021-06-06 DIAGNOSIS — I701 Atherosclerosis of renal artery: Secondary | ICD-10-CM

## 2021-06-06 DIAGNOSIS — Z888 Allergy status to other drugs, medicaments and biological substances status: Secondary | ICD-10-CM

## 2021-06-06 DIAGNOSIS — I150 Renovascular hypertension: Secondary | ICD-10-CM | POA: Diagnosis not present

## 2021-06-06 DIAGNOSIS — Z6832 Body mass index (BMI) 32.0-32.9, adult: Secondary | ICD-10-CM

## 2021-06-06 DIAGNOSIS — I716 Thoracoabdominal aortic aneurysm, without rupture: Secondary | ICD-10-CM | POA: Diagnosis present

## 2021-06-06 DIAGNOSIS — N1832 Chronic kidney disease, stage 3b: Secondary | ICD-10-CM | POA: Diagnosis not present

## 2021-06-06 DIAGNOSIS — N170 Acute kidney failure with tubular necrosis: Secondary | ICD-10-CM | POA: Diagnosis present

## 2021-06-06 DIAGNOSIS — I639 Cerebral infarction, unspecified: Secondary | ICD-10-CM | POA: Diagnosis not present

## 2021-06-06 DIAGNOSIS — R55 Syncope and collapse: Secondary | ICD-10-CM

## 2021-06-06 DIAGNOSIS — R200 Anesthesia of skin: Secondary | ICD-10-CM | POA: Diagnosis present

## 2021-06-06 DIAGNOSIS — R0602 Shortness of breath: Secondary | ICD-10-CM | POA: Diagnosis not present

## 2021-06-06 DIAGNOSIS — I252 Old myocardial infarction: Secondary | ICD-10-CM

## 2021-06-06 DIAGNOSIS — N179 Acute kidney failure, unspecified: Secondary | ICD-10-CM

## 2021-06-06 DIAGNOSIS — Z87891 Personal history of nicotine dependence: Secondary | ICD-10-CM

## 2021-06-06 DIAGNOSIS — M1710 Unilateral primary osteoarthritis, unspecified knee: Secondary | ICD-10-CM | POA: Diagnosis present

## 2021-06-06 DIAGNOSIS — Z20822 Contact with and (suspected) exposure to covid-19: Secondary | ICD-10-CM | POA: Diagnosis present

## 2021-06-06 DIAGNOSIS — N280 Ischemia and infarction of kidney: Secondary | ICD-10-CM | POA: Diagnosis present

## 2021-06-06 DIAGNOSIS — Z882 Allergy status to sulfonamides status: Secondary | ICD-10-CM

## 2021-06-06 DIAGNOSIS — E669 Obesity, unspecified: Secondary | ICD-10-CM | POA: Diagnosis present

## 2021-06-06 DIAGNOSIS — I739 Peripheral vascular disease, unspecified: Secondary | ICD-10-CM | POA: Diagnosis present

## 2021-06-06 DIAGNOSIS — E875 Hyperkalemia: Secondary | ICD-10-CM

## 2021-06-06 DIAGNOSIS — N183 Chronic kidney disease, stage 3 unspecified: Secondary | ICD-10-CM | POA: Diagnosis present

## 2021-06-06 DIAGNOSIS — R7989 Other specified abnormal findings of blood chemistry: Secondary | ICD-10-CM | POA: Diagnosis present

## 2021-06-06 DIAGNOSIS — Z885 Allergy status to narcotic agent status: Secondary | ICD-10-CM

## 2021-06-06 DIAGNOSIS — Z79890 Hormone replacement therapy (postmenopausal): Secondary | ICD-10-CM

## 2021-06-06 DIAGNOSIS — E785 Hyperlipidemia, unspecified: Secondary | ICD-10-CM | POA: Diagnosis present

## 2021-06-06 DIAGNOSIS — E7801 Familial hypercholesterolemia: Secondary | ICD-10-CM

## 2021-06-06 DIAGNOSIS — D631 Anemia in chronic kidney disease: Secondary | ICD-10-CM | POA: Diagnosis present

## 2021-06-06 DIAGNOSIS — E871 Hypo-osmolality and hyponatremia: Secondary | ICD-10-CM

## 2021-06-06 DIAGNOSIS — Z7902 Long term (current) use of antithrombotics/antiplatelets: Secondary | ICD-10-CM

## 2021-06-06 DIAGNOSIS — K219 Gastro-esophageal reflux disease without esophagitis: Secondary | ICD-10-CM | POA: Diagnosis present

## 2021-06-06 DIAGNOSIS — I2510 Atherosclerotic heart disease of native coronary artery without angina pectoris: Secondary | ICD-10-CM | POA: Diagnosis present

## 2021-06-06 DIAGNOSIS — I10 Essential (primary) hypertension: Secondary | ICD-10-CM | POA: Diagnosis not present

## 2021-06-06 DIAGNOSIS — H9190 Unspecified hearing loss, unspecified ear: Secondary | ICD-10-CM | POA: Diagnosis present

## 2021-06-06 DIAGNOSIS — M353 Polymyalgia rheumatica: Secondary | ICD-10-CM | POA: Diagnosis present

## 2021-06-06 DIAGNOSIS — E8881 Metabolic syndrome: Secondary | ICD-10-CM | POA: Diagnosis present

## 2021-06-06 DIAGNOSIS — E890 Postprocedural hypothyroidism: Secondary | ICD-10-CM | POA: Diagnosis present

## 2021-06-06 DIAGNOSIS — Z8673 Personal history of transient ischemic attack (TIA), and cerebral infarction without residual deficits: Secondary | ICD-10-CM

## 2021-06-06 DIAGNOSIS — T508X5A Adverse effect of diagnostic agents, initial encounter: Secondary | ICD-10-CM | POA: Diagnosis not present

## 2021-06-06 DIAGNOSIS — Z79899 Other long term (current) drug therapy: Secondary | ICD-10-CM

## 2021-06-06 DIAGNOSIS — E039 Hypothyroidism, unspecified: Secondary | ICD-10-CM

## 2021-06-06 LAB — PROTIME-INR
INR: 1 (ref 0.8–1.2)
Prothrombin Time: 13.2 seconds (ref 11.4–15.2)

## 2021-06-06 LAB — COMPREHENSIVE METABOLIC PANEL
ALT: 12 U/L (ref 0–44)
AST: 20 U/L (ref 15–41)
Albumin: 3.7 g/dL (ref 3.5–5.0)
Alkaline Phosphatase: 89 U/L (ref 38–126)
Anion gap: 8 (ref 5–15)
BUN: 26 mg/dL — ABNORMAL HIGH (ref 8–23)
CO2: 25 mmol/L (ref 22–32)
Calcium: 9 mg/dL (ref 8.9–10.3)
Chloride: 98 mmol/L (ref 98–111)
Creatinine, Ser: 1.98 mg/dL — ABNORMAL HIGH (ref 0.44–1.00)
GFR, Estimated: 26 mL/min — ABNORMAL LOW (ref >60)
Glucose, Bld: 119 mg/dL — ABNORMAL HIGH (ref 70–99)
Potassium: 4.4 mmol/L (ref 3.5–5.1)
Sodium: 131 mmol/L — ABNORMAL LOW (ref 135–145)
Total Bilirubin: 0.6 mg/dL (ref 0.3–1.2)
Total Protein: 6.6 g/dL (ref 6.5–8.1)

## 2021-06-06 LAB — CBC
HCT: 29.9 % — ABNORMAL LOW (ref 36.0–46.0)
Hemoglobin: 10 g/dL — ABNORMAL LOW (ref 12.0–15.0)
MCH: 31.4 pg (ref 26.0–34.0)
MCHC: 33.4 g/dL (ref 30.0–36.0)
MCV: 94 fL (ref 80.0–100.0)
Platelets: 213 10*3/uL (ref 150–400)
RBC: 3.18 MIL/uL — ABNORMAL LOW (ref 3.87–5.11)
RDW: 12.4 % (ref 11.5–15.5)
WBC: 5.8 10*3/uL (ref 4.0–10.5)
nRBC: 0 % (ref 0.0–0.2)

## 2021-06-06 LAB — DIFFERENTIAL
Abs Immature Granulocytes: 0.01 10*3/uL (ref 0.00–0.07)
Basophils Absolute: 0 10*3/uL (ref 0.0–0.1)
Basophils Relative: 1 %
Eosinophils Absolute: 0.3 10*3/uL (ref 0.0–0.5)
Eosinophils Relative: 5 %
Immature Granulocytes: 0 %
Lymphocytes Relative: 42 %
Lymphs Abs: 2.4 10*3/uL (ref 0.7–4.0)
Monocytes Absolute: 0.5 10*3/uL (ref 0.1–1.0)
Monocytes Relative: 9 %
Neutro Abs: 2.5 10*3/uL (ref 1.7–7.7)
Neutrophils Relative %: 43 %

## 2021-06-06 LAB — RESP PANEL BY RT-PCR (FLU A&B, COVID) ARPGX2
Influenza A by PCR: NEGATIVE
Influenza B by PCR: NEGATIVE
SARS Coronavirus 2 by RT PCR: NEGATIVE

## 2021-06-06 LAB — BRAIN NATRIURETIC PEPTIDE: B Natriuretic Peptide: 156.9 pg/mL — ABNORMAL HIGH (ref 0.0–100.0)

## 2021-06-06 LAB — APTT: aPTT: 29 seconds (ref 24–36)

## 2021-06-06 LAB — CBG MONITORING, ED: Glucose-Capillary: 114 mg/dL — ABNORMAL HIGH (ref 70–99)

## 2021-06-06 LAB — TROPONIN I (HIGH SENSITIVITY): Troponin I (High Sensitivity): 9 ng/L (ref <18)

## 2021-06-06 IMAGING — CT CT HEAD W/O CM
4 series · 17 of 47 positions shown, 19 images · non-contrast
Comparison: None.

CLINICAL DATA: Transient ischemic attack

EXAM:
CT HEAD WITHOUT CONTRAST
TECHNIQUE: Contiguous axial images were obtained from the base of the skull
through the vertex without intravenous contrast.

[Series 2: head bone · axial · 0.44mm/px · z∈[-208,-152]mm · 4 of 80 slices shown]
[im 8/80  bone]
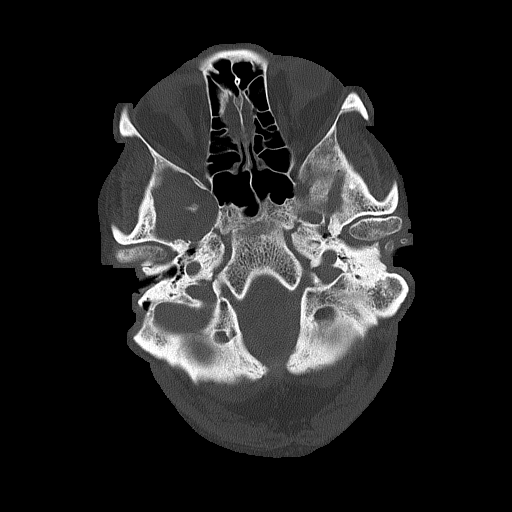
[im 16/80  bone]
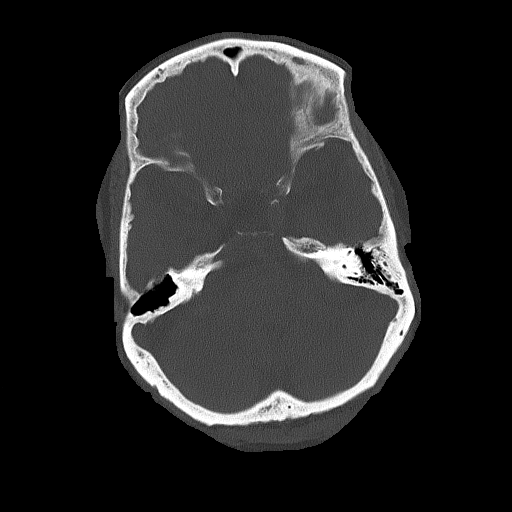
[im 24/80  bone]
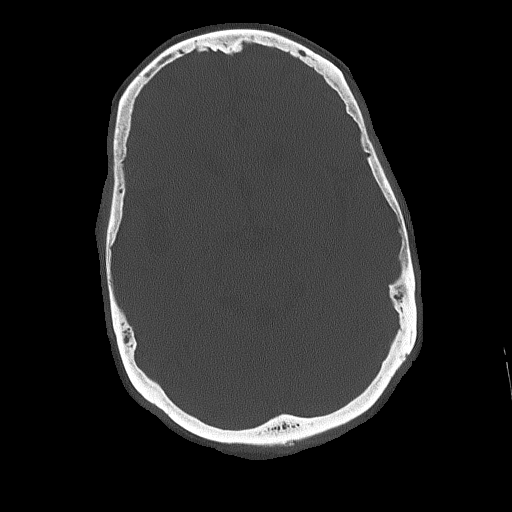
[im 36/80  bone]
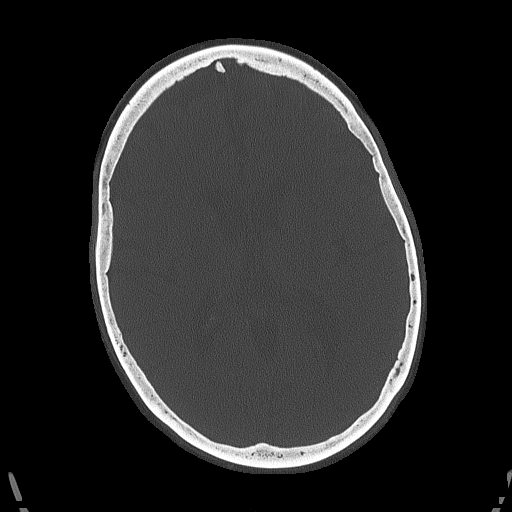

[Series 3: head wo · axial · 0.44mm/px · z∈[-207,-87]mm · 7 of 32 slices shown, 9 images]
[im 4/32  brain]
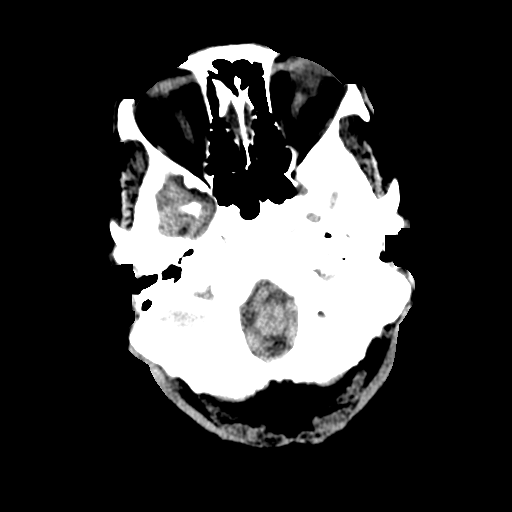
[im 4/32  bone]
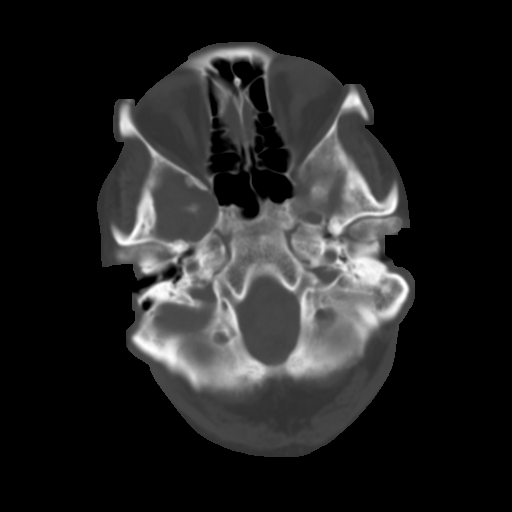
[im 8/32  brain]
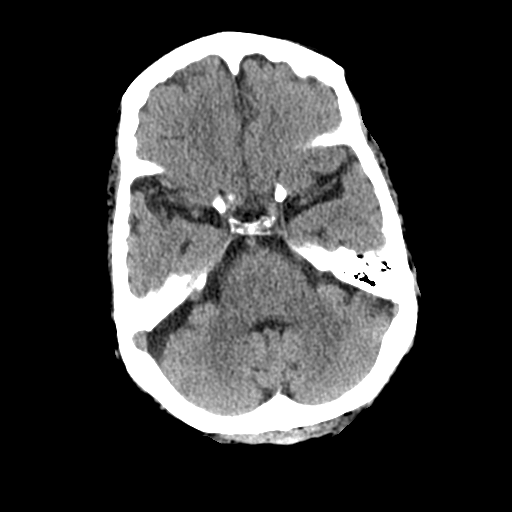
[im 12/32  brain]
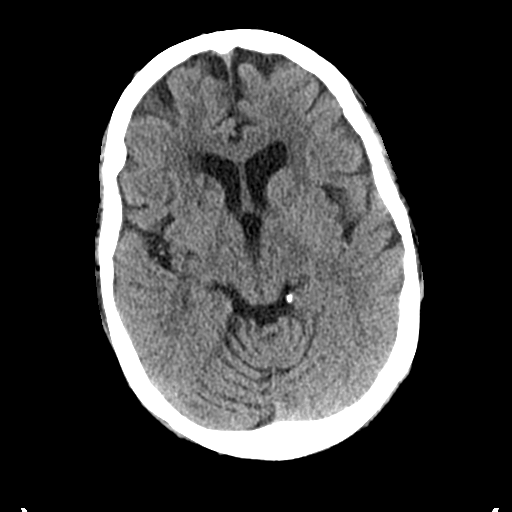
[im 16/32  brain]
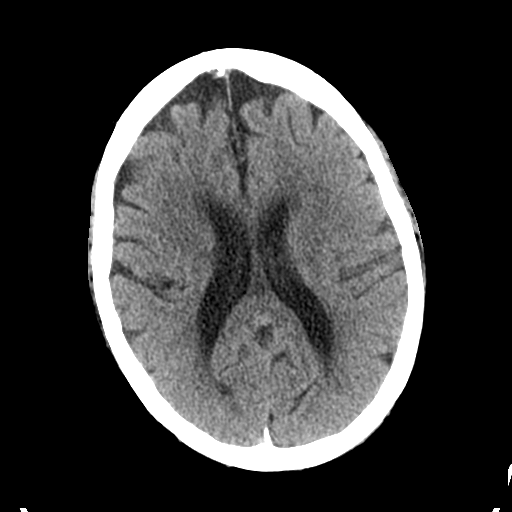
[im 20/32  brain]
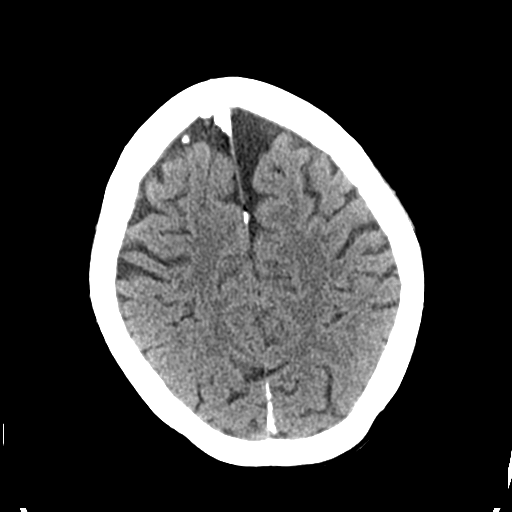
[im 20/32  bone]
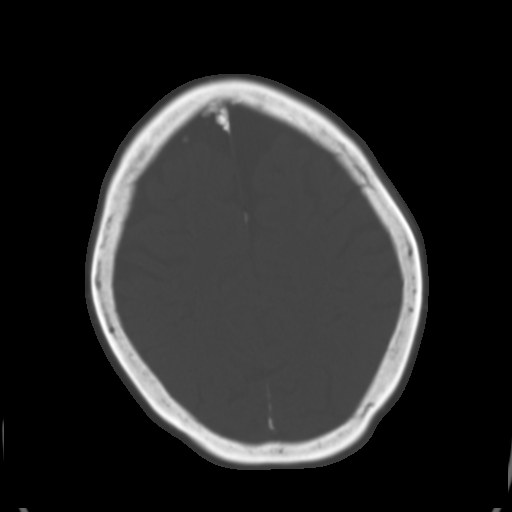
[im 24/32  brain]
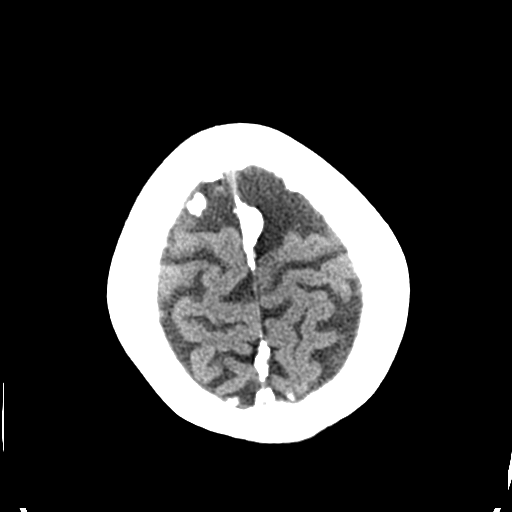
[im 28/32  brain]
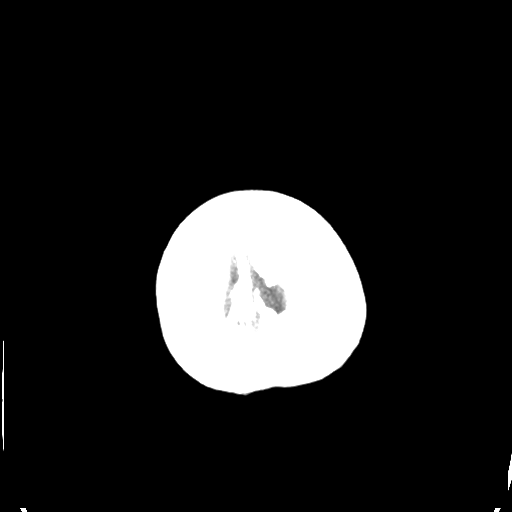

[Series 4: coronal soft tissue · coronal · 0.33mm/px · 3 of 68 slices shown]
[im 23/68  brain]
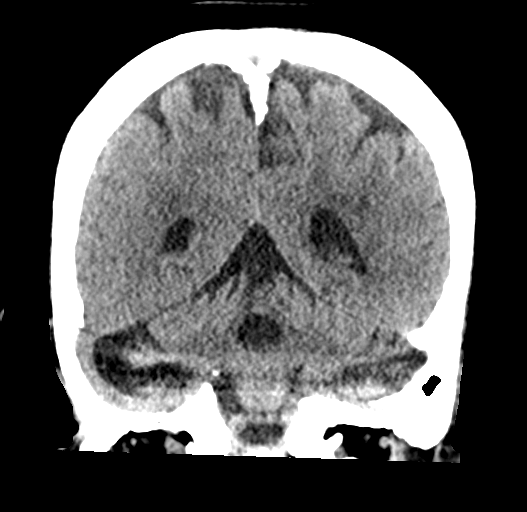
[im 30/68  brain]
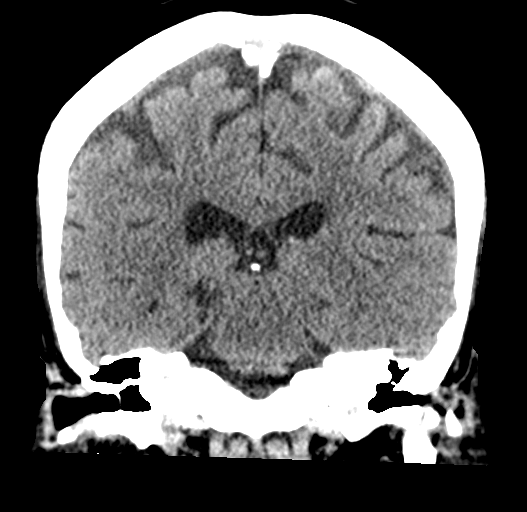
[im 38/68  brain]
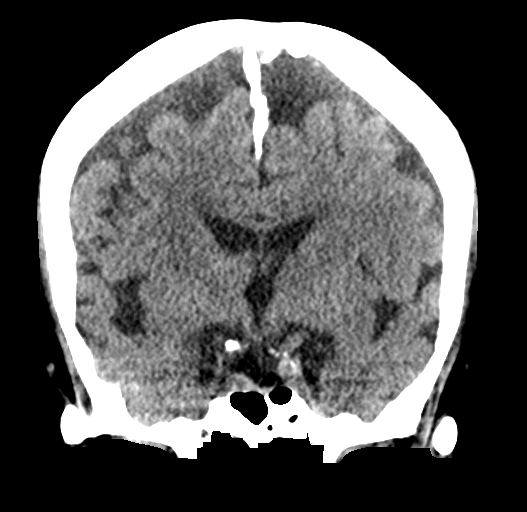

[Series 5: sagittal soft tissue · sagittal · 0.33mm/px · 3 of 58 slices shown]
[im 20/58  brain]
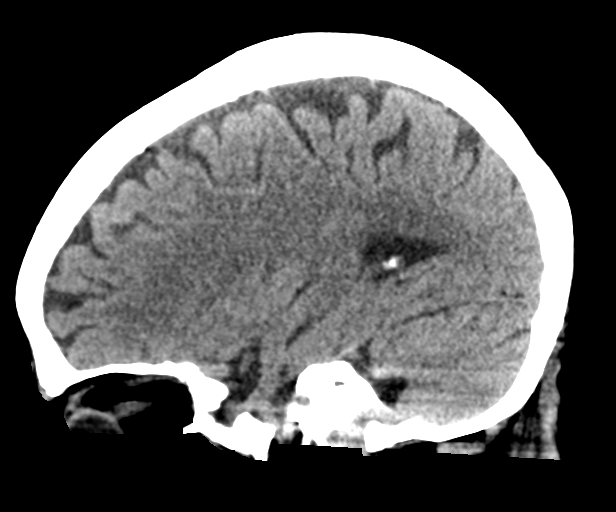
[im 29/58  brain]
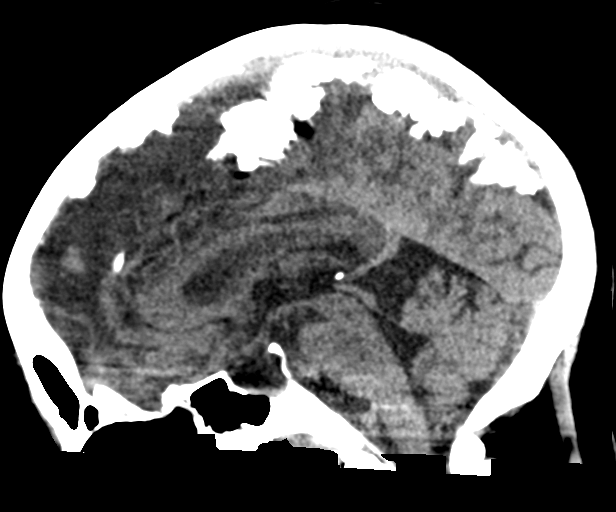
[im 39/58  brain]
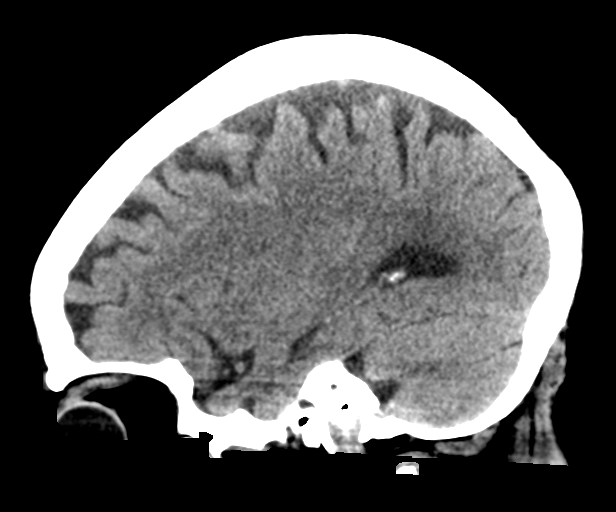

[17 of 47 positions shown; findings below may reference images not displayed]

FINDINGS: Brain: There is no mass, hemorrhage or extra-axial collection. The
size and configuration of the ventricles and extra-axial CSF spaces
are normal. The brain parenchyma is normal, without acute or chronic
infarction.

Vascular: No abnormal hyperdensity of the major intracranial
arteries or dural venous sinuses. No intracranial atherosclerosis.

Skull: The visualized skull base, calvarium and extracranial soft
tissues are normal.

Sinuses/Orbits: No fluid levels or advanced mucosal thickening of
the visualized paranasal sinuses. No mastoid or middle ear effusion.
The orbits are normal.
IMPRESSION: Normal head CT.

## 2021-06-06 IMAGING — DX DG CHEST 1V PORT
1 series · 1 of 1 positions shown · non-contrast
Comparison: [DATE]

CLINICAL DATA: Shortness of breath. Evaluate for edema. Dizziness
for 3 weeks. Left arm tingling and numbness started a.

EXAM:
PORTABLE CHEST 1 VIEW

[chest ap]
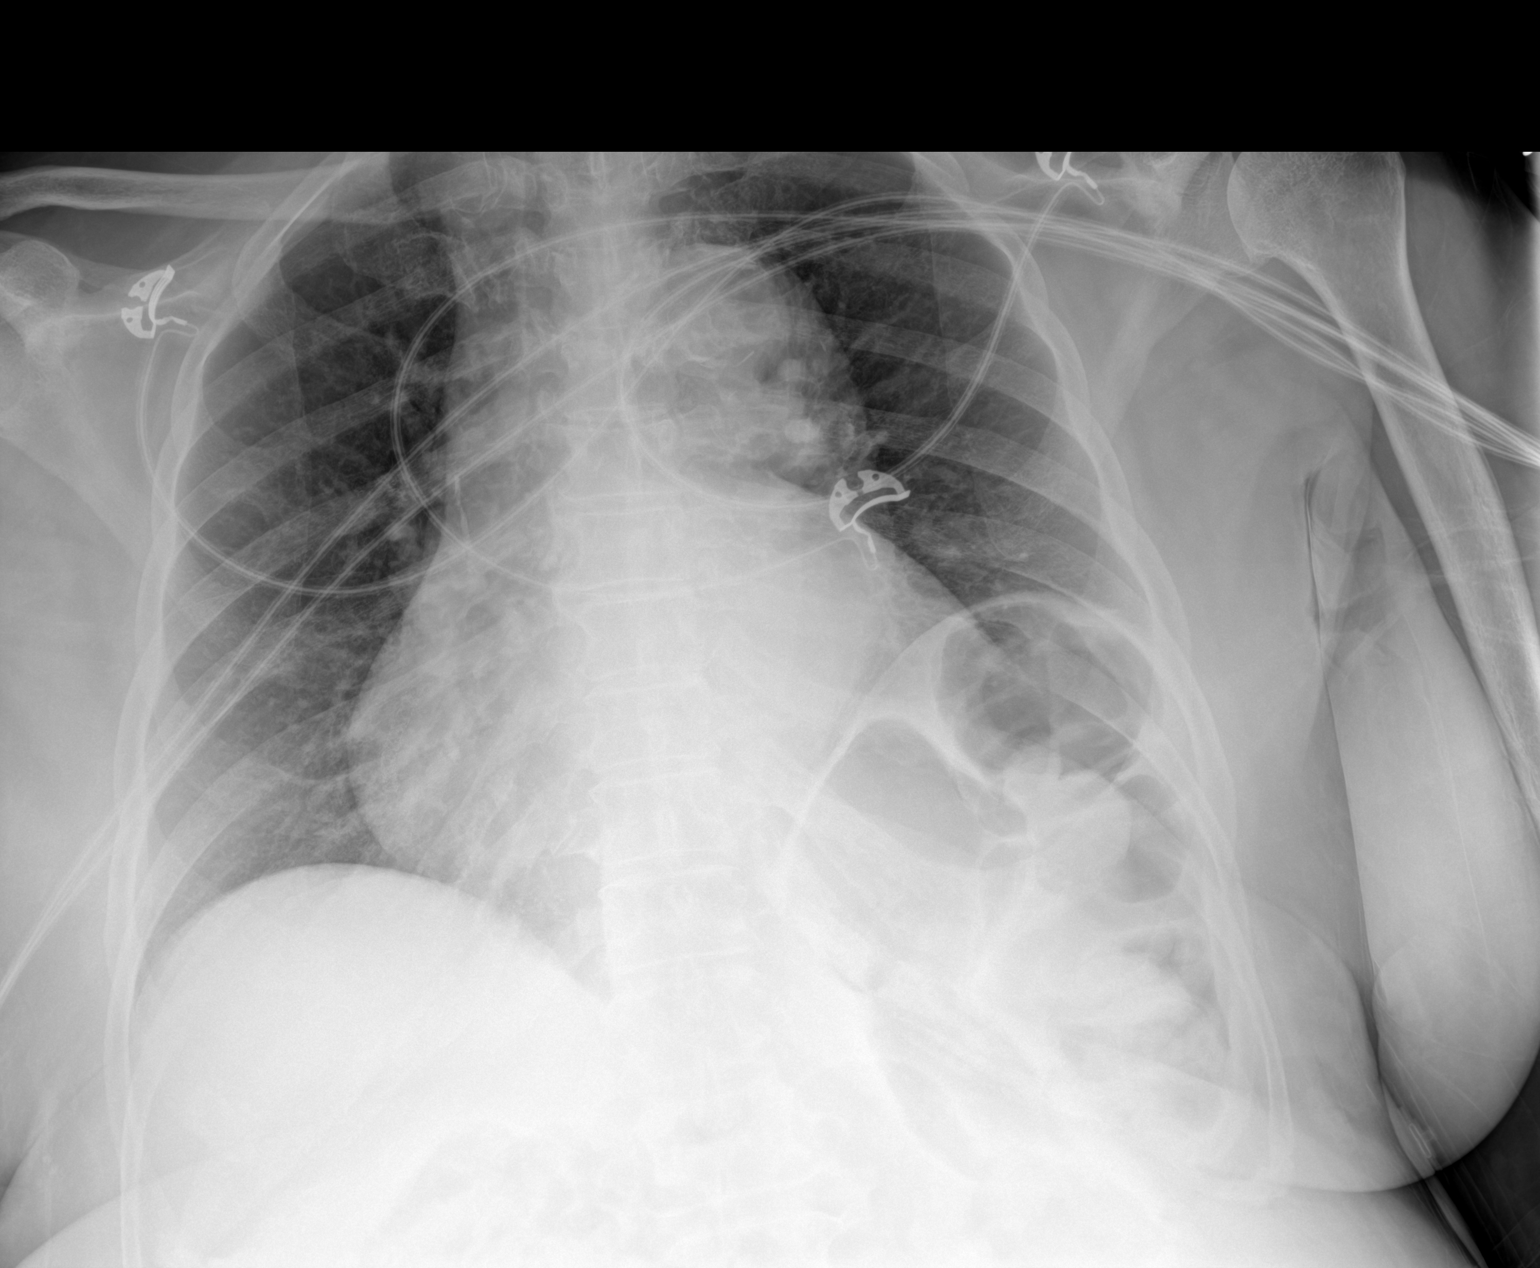

[1 of 1 positions shown; findings below may reference images not displayed]

FINDINGS: Cardiac enlargement. No vascular congestion, edema, or
consolidation. Lungs are clear. Segmental elevation of the left
hemidiaphragm similar to prior study. No pleural effusions. No
pneumothorax. Calcified and tortuous aorta.
IMPRESSION: Cardiac enlargement.  No evidence of active pulmonary disease.

## 2021-06-06 MED ORDER — ASPIRIN 81 MG PO CHEW
324.0000 mg | CHEWABLE_TABLET | Freq: Once | ORAL | Status: AC
  Administered 2021-06-06: 324 mg via ORAL
  Filled 2021-06-06: qty 4

## 2021-06-06 MED ORDER — ACETAMINOPHEN 650 MG RE SUPP: 650.0000 mg | Freq: Four times a day (QID) | RECTAL | Status: DC | PRN

## 2021-06-06 MED ORDER — ACETAMINOPHEN 325 MG PO TABS
650.0000 mg | ORAL_TABLET | Freq: Four times a day (QID) | ORAL | Status: DC | PRN
  Administered 2021-06-08 – 2021-06-16 (×7): 650 mg via ORAL
  Filled 2021-06-06 (×7): qty 2

## 2021-06-06 MED ORDER — STROKE: EARLY STAGES OF RECOVERY BOOK: Freq: Once | Status: AC

## 2021-06-06 MED ORDER — NITROGLYCERIN 2 % TD OINT
1.0000 [in_us] | TOPICAL_OINTMENT | Freq: Once | TRANSDERMAL | Status: AC
  Administered 2021-06-06: 1 [in_us] via TOPICAL
  Filled 2021-06-06: qty 1

## 2021-06-06 NOTE — H&P (Signed)
History and Physical    PLEASE NOTE THAT DRAGON DICTATION SOFTWARE WAS USED IN THE CONSTRUCTION OF THIS NOTE.   Betty Nielsen [id number] DOB: [birth year] DOA: 06/06/2021  PCP: Steele Sizer, MD Patient coming from: home   I have personally briefly reviewed patient's old medical records in [place]  Chief Complaint: Left upper extremity/left face numbness  HPI: Betty Nielsen is a 75 y.o. 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   The patient reports sudden onset of numbness involving the left upper extremity as well as the left side of her face starting at approximately 2100 on 06/05/2021 while at rest.  She went to bed last evening while experiencing these symptoms, in the setting of her perpetuation on awaking this morning, elected to present to [facility] ED for further evaluation thereof.  She reports that the numbness involving the left upper extremity starts at approximately the level of the left shoulder distally down both the anterior and posterior aspects of her left arm and terminating in the distal aspects of all 5 digits on the left hand.  While she has not yet regained her baseline sensation involving the left upper extremity, she reports this some improvement relative to her initial left upper extremity numbness, now noting the sensation of paresthesias in the above distribution.  Regarding her left-sided facial numbness she reports that, initially, she was experiencing  numbness to the V2/V3distribution left portion of her face, without the sensory deficit crossing midline of the face.  She denies involvement of the left forehead.  Similar to the left upper extremity, she reports that the numbness associated with her left face has gradually improved since onset as well, now noting the sensation paresthesias in the above distribution.  Denies any associated acute focal weakness,dysphagia, dizziness, vertigo, nausea, vomiting, change in vision, blurry vision, diplopia, word finding difficulties, slurring of speech, facial droop, or headache. Denies any associated chest pain, shortness of breath, palpitations, diaphoresis, presyncope, or syncope.  She confirms a history of acute ischemic stroke in February 2021, at which time MRI brain revealed small acute lacunar infarct in the right caudate nucleus.  Subsequently, she reports good compliance on Plavix 75 mg p.o. daily as her sole outpatient blood thinner, noting no formal anticoagulation or use of aspirin at home.  She notes that she has remained on daily Plavix since the time of her aforementioned acute ischemic stroke, as she experienced a stroke while already on a daily aspirin.  In terms of modifiable ischemic CVA risk factors, the patient confirms a history of hypertension for which she reports good compliance with her home irbesartan, labetalol, and oral clonidine.  She also confirms a history of hyperlipidemia for which she reports compliance on atorvastatin 40 mg p.o. daily.  Confirms that she is a former smoker, having completely quit smoking in 2000 after previously smoking half pack per day over the preceding 20 years.  Denies any known history of underlying diabetes, atrial fibrillation, or obstructive sleep apnea.  Denies any subjective fever, chills, rigors, or generalized myalgias. Denies any recent headache,  neck stiffness, rhinitis, rhinorrhea, sore throat, wheezing, cough, abdominal pain, diarrhea, or rash.   No recent traveling or known COVID-19 exposures.  Denies any recent dysuria, gross hematuria, or change in urinary urgency/frequency.    ED Course:  Vital signs in the ED were notable for the following: Tetramex 97.6; heart rate 61-71; blood pressure 203/93 -226/115, with most recent blood pressure noted to be 203/93; respiratory rate 16-19, oxygen saturation 99 to 100% on room air.  Labs were notable for the following: CMP notable for the following: Sodium 131, potassium 4.4, bicarbonate 25, creatinine 1.98 relative to most recent prior value of 1.82 in September 2021, glucose 119, and liver enzymes probably within normal limits.  CBC notable for white cell count 5800, globin 10 compared to most recent prior value of 10.8 in February 2021.  High-sensitivity troponin I x2 were both found to be 9.  INR 1.0.  Screening COVID-19 PCR was checked in the ED today and found to be negative.  Imaging and additional notable ED work-up: EKG, in comparison to most recent prior from 10/29/2019 shows sinus rhythm with heart rate 68, normal intervals, T wave inversion in aVL, which is unchanged from most recent prior EKG, no evidence of ST changes, including no evidence of ST elevation, and unchanged potential Q wave in V1.  Noncontrast CT of the head showed no evidence of acute intracranial process, including no evidence of intracranial hemorrhage which.  Chest x-ray shows no evidence of acute cardiopulmonary process, including no evidence of infiltrate, edema, effusion, or pneumothorax.  While in the ED, the following were administered: Aspirin 324 mg p.o. x1.  Subsequently, the patient was admitted overnight observation for further evaluation and management of suspected acute ischemic CVA.     Review of Systems: As per HPI otherwise 10 point review of systems negative.   Past Medical History:  Diagnosis Date   Arthrosis of knee    Chronic kidney disease    Hyperlipidemia    Hypertension    Loss of hearing     Metabolic syndrome    Plantar fasciitis    Renal insufficiency     Past Surgical History:  Procedure Laterality Date   ABDOMINAL AORTIC ANEURYSM REPAIR     ABDOMINAL HYSTERECTOMY     APPENDECTOMY     CORONARY STENT INTERVENTION N/A 10/26/2016   Procedure: Coronary Stent Intervention;  Surgeon: Yolonda Kida, MD;  Location: [facility];  Service: Cardiovascular;  Laterality: N/A;   HERNIA REPAIR     INNER EAR SURGERY     pt not sure of type   LEFT HEART CATH AND CORONARY ANGIOGRAPHY N/A 10/26/2016   Procedure: Left Heart Cath and Coronary Angiography;  Surgeon: Teodoro Spray, MD;  Location: [facility];  Service: Cardiovascular;  Laterality: N/A;   THYROIDECTOMY      Social History:  reports that she quit smoking about 22 years ago. Her smoking use included cigarettes. She has a 10.00 pack-year smoking history. She has never used smokeless tobacco. She reports that she does not drink alcohol and does not use drugs.   Allergies  Allergen Reactions   Ace Inhibitors Cough   Ciprofloxacin Other (See Comments)    unknown   Citalopram     hyponatremia   Hydrocodone-Acetaminophen Other (See Comments)    unknown   Norvasc [Amlodipine Besylate]     Pruritus    Pantoprazole Sodium Other (See Comments)    Cramps   Pravastatin Other (See Comments)  Stomach cramps   Sulfa Antibiotics     unknown   Clonidine Other (See Comments)    Per patient told by allergist that would be best to stop but nephrologist recommended her to stay on medication     Family History  Problem Relation Age of Onset   Healthy Mother    Cerebral aneurysm Father     Family history reviewed and not pertinent    Prior to Admission medications   Medication Sig Start Date End Date Taking? Authorizing Provider  atorvastatin (LIPITOR) 40 MG tablet Take 1 tablet (40 mg total) by mouth daily at 6 PM. Cholesterol medication. 12/16/20 06/06/21 Yes Sowles, Drue Stager, MD  cetirizine (ZYRTEC)  10 MG tablet Take 1 tablet (10 mg total) by mouth daily as needed. PRN only 10/28/19  Yes Enzo Bi, MD  Cholecalciferol 25 MCG (1000 UT) tablet Take 1,000 Units by mouth daily.    Yes [provider]  cloNIDine (CATAPRES) 0.2 MG tablet Take 0.2 mg by mouth 2 (two) times daily.   Yes Kolluru, Sarath, MD  clopidogrel (PLAVIX) 75 MG tablet TAKE 1 TABLET BY MOUTH ONCE DAILY. BLOOD THINNER TO HELP REDUCE STROKE RISK 01/11/21  Yes Sowles, Drue Stager, MD  irbesartan (AVAPRO) 300 MG tablet Take 1 tablet (300 mg total) by mouth daily. 12/16/20  Yes Sowles, Drue Stager, MD  labetalol (NORMODYNE) 100 MG tablet Take 1 tablet by mouth 2 (two) times daily.  10/27/17  Yes Callwood, Dwayne D, MD  levothyroxine (SYNTHROID) 75 MCG tablet Take 1 tablet (75 mcg total) by mouth daily before breakfast. 12/16/20  Yes Sowles, Drue Stager, MD  Omega-3 Fatty Acids (FISH OIL) 1000 MG CPDR Take 2 capsules by mouth daily.   Yes [provider]  montelukast (SINGULAIR) 10 MG tablet Take 1 tablet (10 mg total) by mouth at bedtime. Patient not taking: Reported on 06/06/2021 12/16/20   Steele Sizer, MD  nitroGLYCERIN (NITROSTAT) 0.4 MG SL tablet Place under the tongue. 10/28/16   [provider]     Objective    Physical Exam: Vitals:   06/06/21 2102 06/06/21 2108 06/06/21 2155 06/06/21 2251  BP: (!) 216/98  (!) 226/115 (!) 212/185  Pulse: 72  71 62  Resp: '18  19 18  '$ Temp: 97.6 F (36.4 C)     TempSrc: Oral     SpO2: 99%  100% 100%  Weight:  84.8 kg    Height:  '5\' 5"'$  (1.651 m)      General: appears to be stated age; alert, oriented Skin: warm, dry, no rash Head:  AT/[place] Mouth:  Oral mucosa membranes appear moist, normal dentition Neck: supple; trachea midline Heart:  RRR; did not appreciate any M/R/G Lungs: CTAB, did not appreciate any wheezes, rales, or rhonchi Abdomen: + BS; soft, ND, NT Vascular: 2+ pedal pulses b/l; 2+ radial pulses b/l Extremities: no peripheral edema, no muscle wasting Neuro:  5/5 strength of the proximal and distal flexors and extensors of the upper and lower extremities bilaterally; sensation intact in the b/l LE's as well as the right UE, with diminished sensation to light touch noted involving the left upper extremity from the level of the left shoulder extending distally and terminating at the distal aspect of all 5 digits on the left hand; decreased sensation to light touch in V2/V3 distribution of the left face; otherwise, cranial nerves II through XII appear grossly intact; no pronator drift; no evidence suggestive of slurred speech, dysarthria, or facial droop; Normal muscle tone. No tremors.  Labs on Admission: I have personally reviewed following labs and imaging studies  CBC: Recent Labs  Lab 06/06/21 2110  WBC 5.8  NEUTROABS 2.5  HGB 10.0*  HCT 29.9*  MCV 94.0  PLT 123456   Basic Metabolic Panel: Recent Labs  Lab 06/06/21 2110  NA 131*  K 4.4  CL 98  CO2 25  GLUCOSE 119*  BUN 26*  CREATININE 1.98*  CALCIUM 9.0   GFR: Estimated Creatinine Clearance: 26.4 mL/min (A) (by C-G formula based on SCr of 1.98 mg/dL (H)). Liver Function Tests: Recent Labs  Lab 06/06/21 2110  AST 20  ALT 12  ALKPHOS 89  BILITOT 0.6  PROT 6.6  ALBUMIN 3.7   No results for input(s): LIPASE, AMYLASE in the last 168 hours. No results for input(s): AMMONIA in the last 168 hours. Coagulation Profile: Recent Labs  Lab 06/06/21 2110  INR 1.0   Cardiac Enzymes: No results for input(s): CKTOTAL, CKMB, CKMBINDEX, TROPONINI in the last 168 hours. BNP (last 3 results) No results for input(s): PROBNP in the last 8760 hours. HbA1C: No results for input(s): HGBA1C in the last 72 hours. CBG: Recent Labs  Lab 06/06/21 2110  GLUCAP 114*   Lipid Profile: No results for input(s): CHOL, HDL, LDLCALC, TRIG, CHOLHDL, LDLDIRECT in the last 72 hours. Thyroid Function Tests: No results for input(s): TSH, T4TOTAL, FREET4, T3FREE, THYROIDAB in the last 72  hours. Anemia Panel: No results for input(s): VITAMINB12, FOLATE, FERRITIN, TIBC, IRON, RETICCTPCT in the last 72 hours. Urine analysis:    Component Value Date/Time   COLORURINE COLORLESS (A) 08/29/2018 1911   APPEARANCEUR CLEAR (A) 08/29/2018 1911   APPEARANCEUR Clear 04/04/2012 1438   LABSPEC 1.002 (L) 08/29/2018 1911   LABSPEC 1.004 04/04/2012 1438   PHURINE 7.0 08/29/2018 1911   GLUCOSEU NEGATIVE 08/29/2018 1911   GLUCOSEU Negative 04/04/2012 1438   HGBUR MODERATE (A) 08/29/2018 1911   BILIRUBINUR NEGATIVE 08/29/2018 1911   BILIRUBINUR Negative 04/04/2012 1438   KETONESUR NEGATIVE 08/29/2018 1911   PROTEINUR 30 (A) 08/29/2018 1911   UROBILINOGEN 0.2 04/15/2008 0946   NITRITE NEGATIVE 08/29/2018 1911   LEUKOCYTESUR NEGATIVE 08/29/2018 1911   LEUKOCYTESUR Trace 04/04/2012 1438    Radiological Exams on Admission: CT HEAD WO CONTRAST  Result Date: 06/06/2021 CLINICAL DATA:  Transient ischemic attack EXAM: CT HEAD WITHOUT CONTRAST TECHNIQUE: Contiguous axial images were obtained from the base of the skull through the vertex without intravenous contrast. COMPARISON:  None. FINDINGS: Brain: There is no mass, hemorrhage or extra-axial collection. The size and configuration of the ventricles and extra-axial CSF spaces are normal. The brain parenchyma is normal, without acute or chronic infarction. Vascular: No abnormal hyperdensity of the major intracranial arteries or dural venous sinuses. No intracranial atherosclerosis. Skull: The visualized skull base, calvarium and extracranial soft tissues are normal. Sinuses/Orbits: No fluid levels or advanced mucosal thickening of the visualized paranasal sinuses. No mastoid or middle ear effusion. The orbits are normal. IMPRESSION: Normal head CT. Electronically Signed   By: Ulyses Jarred M.D.   On: 06/06/2021 21:47   DG Chest Portable 1 View  Result Date: 06/06/2021 CLINICAL DATA:  Shortness of breath. Evaluate for edema. Dizziness for 3 weeks.  Left arm tingling and numbness started a. EXAM: PORTABLE CHEST 1 VIEW COMPARISON:  10/25/2019 FINDINGS: Cardiac enlargement. No vascular congestion, edema, or consolidation. Lungs are clear. Segmental elevation of the left hemidiaphragm similar to prior study. No pleural effusions. No pneumothorax. Calcified and tortuous aorta. IMPRESSION: Cardiac enlargement.  No evidence  of active pulmonary disease. Electronically Signed   By: Lucienne Capers M.D.   On: 06/06/2021 22:07     EKG: Independently reviewed, with result as described above.    Assessment/Plan   Betty Nielsen is a 75 y.o. 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 ED complaining of numbness involving the left upper extremity and left face.    Principal Problem:   CVA (cerebral vascular accident) [facility]) Active Problems:   Hypertension, benign   Hypothyroidism, postsurgical   CKD (chronic kidney disease), stage III (HCC)   Hyperlipidemia   Chronic hyponatremia   Anemia in chronic kidney disease   Left upper extremity numbness   Left facial numbness   Elevated serum creatinine     #) Acute ischemic CVA: suspected dx on the basis of acute onset of numbness involving the left upper extremity as well as V2/V3 distribution of the left face approximately 2100 on 06/05/2021, with some interval improvement but residual paresthesias in this distribution at the present time, representing a departure from her baseline which she denies any chronic sensory deficits in the above distribution.  Otherwise,  presentation does not appear to be associated with any additional acute focal neurologic deficits.  CT head shows no evidence of acute intracranial process, including no evidence of acute intracranial hemorrhage.  Given the timing of onset of the symptoms, patient presents outside of window for tPA or consideration for thrombectomy.  The patient is being admitted for overnight observation for further evaluation treatment of suspected acute ischemic CVA.  MRI brain as well as MRA brain ordered by the EDP, with results currently pending.  will also need imaging of the neck, as further described below.  Additionally, we will pursue further evaluation of potential modifiable acute ischemic CVA risk factors, as described below.  We will also monitor closely on telemetry to evaluate for the presence of previously undiagnosed atrial fibrillation.  Of note, the patient reportedly possesses multiple modifiable CVA risk factors including a history of essential hypertension and hyperlipidemia, while also being a former smoker, conveying that she completely quit smoking greater than 20 years ago, as further detailed above.  Denies any known history of underlying diabetes obstructive sleep apnea, or paroxysmal atrial fibrillation.  EKG in the ED today showed sinus rhythm without evidence of acute ischemic changes.   She is on daily Plavix 75 mg as well as atorvastatin 40 mg p.o. daily as an outpatient, noting good compliance with these medications as well as her multiple outpatient antihypertensive medications, as outlined above.  Full dose aspirin x1 administered in the ED this evening.  We will resume home high intensity atorvastatin, with next dose now for anti-inflammatory and plaque stabilization properties.  We will observe 24 to 48 hours of permissive hypertension, with associated parameters further quantified below, and with period of observance of permissive hypertension to end at 2100 on 06/07/21.     Plan:  Nursing bedside swallow evaluation x 1 now, and will not initiate oral medications or diet until the patient has passed this. Head of the bed at 30 degrees. Neuro checks per protocol. VS per protocol. Will allow for  permissive hypertension for 24-48 hours following onset of acute focal neurologic deficits, during which will hold home antihypertensive medications, with prn IV hydralazine ordered for systolic blood pressure greater than [id number] or diastolic blood pressure greater than 110 mmHg until 2100 on 06/07/21. Monitor on telemetry, including monitoring for atrial fibrillation as modifiable risk factor for acute ischemic CVA.  Should overnight telemetry not demonstrate evidence of atrial fibrillation, can consider 30-day cardiac event monitor at the time of discharge to further evaluate for this arrhythmia.  Follow for result of MRI brain/MRA head.. Bilateral carotid US ordered for the morning. TTE with bubble study has been ordered for the morning to evaluate for intracardiac thrombus, septal wall aneurysm, or septal wall defect. Check lipid panel and A1c. PT/OT consults have been ordered to occur in the morning.  Continue home daily Plavix, with consideration for brief course of dual antiplatelet therapy depending upon the results of the above imaging studies.  Resume home atorvastatin 40 mg p.o. daily, with consideration for escalation to 80 mg p.o. daily imaging confirmatory of acute ischemic stroke while 40 mg dose.       #) Elevated creatinine relative to baseline: History of stage IIIb chronic kidney disease with baseline creatinine range appearing to be 1.7-1.8, with most recent prior serum creatinine data point noted to have occurred almost one years, with value of 1.82 on 06/18/2020.  Relative to prior data points, presenting creatinine found to be 1.98.  Given interval serum creatinine data points for almost the last it is unclear if this slight elevation in presenting creatinine relative to  establish baseline represents elevation without meeting quantitative threshold for AKI versus represents slight interval worsening in her baseline renal function.   Plan: Monitor strict I's and O's and daily weights.  Tempt avoid nephrotoxic agents.  Check urinalysis with microscopy.  Add on random urine sodium as well as random urine creatinine.  Repeat BMP in the morning.        #) Anemia of chronic kidney disease: Documented history of such, associated with baseline hemoglobin range of 10-11.  Presenting CBC reflects hemoglobin consistent with this range, without evidence of active bleed at this time.  Of note, presenting INR found to be 1.0.   Plan: Repeat CBC in the morning.      #) Essential hypertension: documented history of such on irbesartan, labetalol, and oral clonidine as an outpatient, with good reported compliance on all of these outpatient and hypertensive medications.  As described above, in the setting of suspected acute ischemic CVA, will observe permissive hypertension for 24 - 48 hours following initial onset of acute focal neurologic deficits, with this period ending at 2100 on 06/07/21.   Plan: Permissive hypertension until 2100 on 06/07/2021, during which all home antihypertensive medications will be held.  As needed IV hydralazine for systolic blood pressure greater than [id number] or diastolic blood pressure greater than 110 mmHg during period of permissive hypertension.  Close monitoring of ensuing review routine vital signs.  Further evaluation and management of suspected presenting acute ischemic CVA, as above.       #) Hyperlipidemia: Documented history of such, on atorvastatin 40 mg po qdaily.   Plan: check lipid panel as component of ischemic CVA risk factors, as above.  For now, continue home dose of atorvastatin, with potential consideration for escalation to 80 milligrams p.o. daily, depending upon results of acute ischemic cva evaluation, as above.        #) Acquired hypothyroidism: Documented history of such,  on Synthroid as an outpatient.  Plan: Continue home dose of Synthroid.      #) Chronic hypoosmolar hyponatremia: Dating back to 2009, it appears that the patient has a baseline serum sodium range of 1 29-1 35, with presenting serum sodium found to be consistent with this baseline range.  Given the consistency of this baseline range, differential includes reset osmostat.    Plan: Monitor strict I's and O's and daily weights.  Repeat BMP in the morning.     DVT prophylaxis: scd's  Code Status: Full code Family Communication: none Disposition Plan: Per Rounding Team Consults called: none;  Admission status: observation;      Of note, this patient was added by me to the following Admit List/Treatment Team: armcadmits.    Of note, the Adult Admission Order Set (Multimorbid order set) was used by me in the admission process for this patient.   PLEASE NOTE THAT DRAGON DICTATION SOFTWARE WAS USED IN THE CONSTRUCTION OF THIS NOTE.   [facility] Pager [phone] From [place]  Otherwise, please contact night-coverage  [email or web address] Password [facility]   06/06/2021, 11:41 PM

## 2021-06-06 NOTE — ED Notes (Signed)
Pt states at home about 1.5hr after taken BP meds she get dizzy

## 2021-06-06 NOTE — ED Notes (Signed)
Swallow screen passed 

## 2021-06-06 NOTE — ED Triage Notes (Addendum)
Pt arrived via POV with c/o dizziness x 3 weeks, pt states L arm tingling and numbness started around 11am this morning, pt states she noticed her L side of her mouth was tingling around 8pm tonight. Pt states her L arm was still numb and tingling as well.   Pt denies any other sxs at this time, speech is clear, pt is alert and oriented x 4, no facial droop noted. Denies any HA. Pt has hx of stroke - pt reports she is on blood thinners but does not know the name.    Pt had stroke in either 2019 or 2020 she can't remember.

## 2021-06-06 NOTE — ED Provider Notes (Signed)
[facility] Emergency Department Provider Note    Event Date/Time   First MD Initiated Contact with Patient 06/06/21 2134     (approximate)  I have reviewed the triage vital signs and the nursing notes.   HISTORY  Chief Complaint Dizziness and Numbness    HPI Betty Nielsen is a 75 y.o. female below listed past medical history presents to the ER for evaluation of shortness of breath as well as tingling in her left arm pain and discomfort radiate up into her left jaw.  States she has a history of stroke has been compliant with her medications.  That she also had a history of heart attack it feels like the tingling pain in her arm is similar to when she had that.  States that she was having some discomfort starting last night but more pronounced this morning and started having some tingling left side of her face and that is when she decided to come to the ER.  Has not had any chest pain or pressure.  No cough or congestion.  No headaches.  Has had some exertional dyspnea and increasing weakness with shortness of breath.  Past Medical History:  Diagnosis Date   Arthrosis of knee    Chronic kidney disease    Hyperlipidemia    Hypertension    Loss of hearing    Metabolic syndrome    Plantar fasciitis    Renal insufficiency    Family History  Problem Relation Age of Onset   Healthy Mother    Cerebral aneurysm Father    Past Surgical History:  Procedure Laterality Date   ABDOMINAL AORTIC ANEURYSM REPAIR     ABDOMINAL HYSTERECTOMY     APPENDECTOMY     CORONARY STENT INTERVENTION N/A 10/26/2016   Procedure: Coronary Stent Intervention;  Surgeon: Yolonda Kida, MD;  Location: [facility] [facility];  Service: Cardiovascular;  Laterality: N/A;   HERNIA REPAIR     INNER EAR SURGERY     pt not sure of type   LEFT HEART CATH AND CORONARY ANGIOGRAPHY N/A 10/26/2016   Procedure: Left Heart Cath and Coronary Angiography;  Surgeon: Teodoro Spray, MD;  Location:  [facility] [facility];  Service: Cardiovascular;  Laterality: N/A;   THYROIDECTOMY     Patient Active Problem List   Diagnosis Date Noted   Benign hypertension with chronic kidney disease, stage III ([place]) 06/18/2020   Anemia in chronic kidney disease 11/21/2019   Stage 3b chronic kidney disease ([place]) 11/21/2019   Hypo-osmolality and hyponatremia 11/21/2019   Secondary hyperparathyroidism of renal origin ([place]) 11/21/2019   History of stroke 10/27/2019   PMR (polymyalgia rheumatica) ([place]) 06/15/2017   Headache 10/27/2016   CKD (chronic kidney disease), stage III ([place]) 10/27/2016   Hyperlipidemia 10/27/2016   Hyponatremia 10/27/2016   Angina pectoris ([place]) 10/25/2016   History of non-ST elevation myocardial infarction (NSTEMI) 10/25/2016   History of coronary angioplasty 10/25/2016   BPV (benign positional vertigo) 06/15/2016   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[place]) 11/21/2015   Acid reflux 06/05/2015   Hypertension, benign 06/05/2015   Hypothyroidism, 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   Major depression in remission ([place]) 06/05/2015   Obesity (BMI 30.0-34.9) 06/05/2015   Neuropathy 06/05/2015   Perennial allergic rhinitis with seasonal variation 06/05/2015   Arthritis, degenerative 07/03/2014   AAA (abdominal aortic aneurysm) without rupture ([place]) 06/26/2014      Prior to Admission medications   Medication Sig Start Date End Date Taking? Authorizing Provider  atorvastatin (LIPITOR)  40 MG tablet Take 1 tablet (40 mg total) by mouth daily at 6 PM. Cholesterol medication. 12/16/20 06/06/21 Yes Sowles, Drue Stager, MD  cetirizine (ZYRTEC) 10 MG tablet Take 1 tablet (10 mg total) by mouth daily as needed. PRN only 10/28/19  Yes Enzo Bi, MD  Cholecalciferol 25 MCG (1000 UT) tablet Take 1,000 Units by mouth daily.    Yes [provider]  cloNIDine (CATAPRES) 0.2 MG tablet Take 0.2 mg by mouth 2 (two) times daily.   Yes Kolluru, Sarath, MD  clopidogrel (PLAVIX) 75 MG tablet TAKE 1 TABLET BY  MOUTH ONCE DAILY. BLOOD THINNER TO HELP REDUCE STROKE RISK 01/11/21  Yes Sowles, Drue Stager, MD  irbesartan (AVAPRO) 300 MG tablet Take 1 tablet (300 mg total) by mouth daily. 12/16/20  Yes Sowles, Drue Stager, MD  labetalol (NORMODYNE) 100 MG tablet Take 1 tablet by mouth 2 (two) times daily.  10/27/17  Yes Callwood, Dwayne D, MD  levothyroxine (SYNTHROID) 75 MCG tablet Take 1 tablet (75 mcg total) by mouth daily before breakfast. 12/16/20  Yes Sowles, Drue Stager, MD  Omega-3 Fatty Acids (FISH OIL) 1000 MG CPDR Take 2 capsules by mouth daily.   Yes [provider]  montelukast (SINGULAIR) 10 MG tablet Take 1 tablet (10 mg total) by mouth at bedtime. Patient not taking: Reported on 06/06/2021 12/16/20   Steele Sizer, MD  nitroGLYCERIN (NITROSTAT) 0.4 MG SL tablet Place under the tongue. 10/28/16   [provider]    Allergies Ace inhibitors, Ciprofloxacin, Citalopram, Hydrocodone-acetaminophen, Norvasc [amlodipine besylate], Pantoprazole sodium, Pravastatin, Sulfa antibiotics, and Clonidine    Social History Social History   Tobacco Use   Smoking status: Former    Packs/day: 0.50    Years: 20.00    Pack years: 10.00    Types: Cigarettes    Quit date: 2000    Years since quitting: 22.7   Smokeless tobacco: Never   Tobacco comments:    smoking cessation materials not required  Vaping Use   Vaping Use: Never used  Substance Use Topics   Alcohol use: No    Alcohol/week: 0.0 standard drinks   Drug use: No    Review of Systems Patient denies headaches, rhinorrhea, blurry vision, numbness, shortness of breath, chest pain, edema, cough, abdominal pain, nausea, vomiting, diarrhea, dysuria, fevers, rashes or hallucinations unless otherwise stated above in HPI. ____________________________________________   PHYSICAL EXAM:  VITAL SIGNS: Vitals:   06/06/21 2155 06/06/21 2251  BP: (!) 226/115 (!) 212/185  Pulse: 71 62  Resp: 19 18  Temp:    SpO2: 100% 100%    Constitutional:  Alert and oriented.  Eyes: Conjunctivae are normal.  Head: Atraumatic. Nose: No congestion/rhinnorhea. Mouth/Throat: Mucous membranes are moist.   Neck: No stridor. Painless ROM.  Cardiovascular: Normal rate, regular rhythm. Grossly normal heart sounds.  Good peripheral circulation. Respiratory: Normal respiratory effort.  No retractions. Lungs CTAB. Gastrointestinal: Soft and nontender. No distention. No abdominal bruits. No CVA tenderness. Genitourinary:  Musculoskeletal: No lower extremity tenderness nor edema.  No joint effusions. Neurologic:  Normal speech and language. No gross focal neurologic deficits are appreciated. No facial droop. Subjective decrease to sensation of lue, left face Skin:  Skin is warm, dry and intact. No rash noted. Psychiatric: Mood and affect are normal. Speech and behavior are normal.  ____________________________________________   LABS (all labs ordered are listed, but only abnormal results are displayed)  Results for orders placed or performed during the hospital encounter of 06/06/21 (from the past 24 hour(s))  Protime-INR  Status: None   Collection Time: 06/06/21  9:10 PM  Result Value Ref Range   Prothrombin Time 13.2 11.4 - 15.2 seconds   INR 1.0 0.8 - 1.2  APTT     Status: None   Collection Time: 06/06/21  9:10 PM  Result Value Ref Range   aPTT 29 24 - 36 seconds  CBC     Status: Abnormal   Collection Time: 06/06/21  9:10 PM  Result Value Ref Range   WBC 5.8 4.0 - 10.5 K/uL   RBC 3.18 (L) 3.87 - 5.11 MIL/uL   Hemoglobin 10.0 (L) 12.0 - 15.0 g/dL   HCT 29.9 (L) 36.0 - 46.0 %   MCV 94.0 80.0 - 100.0 fL   MCH 31.4 26.0 - 34.0 pg   MCHC 33.4 30.0 - 36.0 g/dL   RDW 12.4 11.5 - 15.5 %   Platelets 213 150 - 400 K/uL   nRBC 0.0 0.0 - 0.2 %  Differential     Status: None   Collection Time: 06/06/21  9:10 PM  Result Value Ref Range   Neutrophils Relative % 43 %   Neutro Abs 2.5 1.7 - 7.7 K/uL   Lymphocytes Relative 42 %   Lymphs Abs 2.4  0.7 - 4.0 K/uL   Monocytes Relative 9 %   Monocytes Absolute 0.5 0.1 - 1.0 K/uL   Eosinophils Relative 5 %   Eosinophils Absolute 0.3 0.0 - 0.5 K/uL   Basophils Relative 1 %   Basophils Absolute 0.0 0.0 - 0.1 K/uL   Immature Granulocytes 0 %   Abs Immature Granulocytes 0.01 0.00 - 0.07 K/uL  Comprehensive metabolic panel     Status: Abnormal   Collection Time: 06/06/21  9:10 PM  Result Value Ref Range   Sodium 131 (L) 135 - 145 mmol/L   Potassium 4.4 3.5 - 5.1 mmol/L   Chloride 98 98 - 111 mmol/L   CO2 25 22 - 32 mmol/L   Glucose, Bld 119 (H) 70 - 99 mg/dL   BUN 26 (H) 8 - 23 mg/dL   Creatinine, Ser 1.98 (H) 0.44 - 1.00 mg/dL   Calcium 9.0 8.9 - 10.3 mg/dL   Total Protein 6.6 6.5 - 8.1 g/dL   Albumin 3.7 3.5 - 5.0 g/dL   AST 20 15 - 41 U/L   ALT 12 0 - 44 U/L   Alkaline Phosphatase 89 38 - 126 U/L   Total Bilirubin 0.6 0.3 - 1.2 mg/dL   GFR, Estimated 26 (L) >60 mL/min   Anion gap 8 5 - 15  CBG monitoring, ED     Status: Abnormal   Collection Time: 06/06/21  9:10 PM  Result Value Ref Range   Glucose-Capillary 114 (H) 70 - 99 mg/dL  Brain natriuretic peptide     Status: Abnormal   Collection Time: 06/06/21  9:10 PM  Result Value Ref Range   B Natriuretic Peptide 156.9 (H) 0.0 - 100.0 pg/mL  Troponin I (High Sensitivity)     Status: None   Collection Time: 06/06/21  9:57 PM  Result Value Ref Range   Troponin I (High Sensitivity) 9 <18 ng/L   ____________________________________________  EKG My review and personal interpretation at Time: 21:05   Indication: htn  Rate: 70  Rhythm: sinus Axis: normal Other: nonspeicifc st abn, lvh criteria ____________________________________________  RADIOLOGY  I personally reviewed all radiographic images ordered to evaluate for the above acute complaints and reviewed radiology reports and findings.  These findings were personally discussed with the  patient.  Please see medical record for radiology  report. ____________________________________________   PROCEDURES  Procedure(s) performed:  Procedures    Critical Care performed: no ____________________________________________   INITIAL IMPRESSION / ASSESSMENT AND PLAN / ED COURSE  Pertinent labs & imaging results that were available during my care of the patient were reviewed by me and considered in my medical decision making (see chart for details).   DDX: cva, tia, acs, edema, dissection, htnive urgency  AYDRIANNA STROMAN is a 75 y.o. who presents to the ED with symptoms as.  Patient hypertensive and she does appear slightly tachypneic after walking back but chest x-ray does not show any significant edema does have cardiomegaly.  EKG with some nonspecific changes.  Initial troponin negative.  CT head is negative the patient with paresthesias of the left upper extremity left face.  She is outside of the window for code stroke.  She is Lucianne Lei negative.  Given her risk factors hypertension neurodeficit will discuss with hospitalist for admission for MRI and further work-up.     The patient was evaluated in Emergency Department today for the symptoms described in the history of present illness. He/she was evaluated in the context of the global COVID-19 pandemic, which necessitated consideration that the patient might be at risk for infection with the SARS-CoV-2 virus that causes COVID-19. Institutional protocols and algorithms that pertain to the evaluation of patients at risk for COVID-19 are in a state of rapid change based on information released by regulatory bodies including the CDC and federal and state organizations. These policies and algorithms were followed during the patient's care in the ED.  As part of my medical decision making, I reviewed the following data within the [place] notes reviewed and incorporated, Labs reviewed, notes from prior ED visits and [place] Controlled Substance  Database   ____________________________________________   FINAL CLINICAL IMPRESSION(S) / ED DIAGNOSES  Final diagnoses:  Numbness  Hypertension, unspecified type      NEW MEDICATIONS STARTED DURING THIS VISIT:  New Prescriptions   No medications on file     Note:  This document was prepared using Dragon voice recognition software and may include unintentional dictation errors.    Merlyn Lot, MD 06/06/21 [phone]

## 2021-06-07 ENCOUNTER — Observation Stay: Payer: Medicare Other

## 2021-06-07 ENCOUNTER — Encounter: Payer: Self-pay | Admitting: Internal Medicine

## 2021-06-07 ENCOUNTER — Observation Stay (HOSPITAL_BASED_OUTPATIENT_CLINIC_OR_DEPARTMENT_OTHER)
Admit: 2021-06-07 | Discharge: 2021-06-07 | Disposition: A | Discharge status: Home / Self Care | Payer: Medicare Other | Attending: Internal Medicine | Admitting: Internal Medicine

## 2021-06-07 DIAGNOSIS — R7989 Other specified abnormal findings of blood chemistry: Secondary | ICD-10-CM | POA: Diagnosis present

## 2021-06-07 DIAGNOSIS — Z9189 Other specified personal risk factors, not elsewhere classified: Secondary | ICD-10-CM

## 2021-06-07 DIAGNOSIS — I10 Essential (primary) hypertension: Secondary | ICD-10-CM | POA: Diagnosis not present

## 2021-06-07 DIAGNOSIS — R200 Anesthesia of skin: Secondary | ICD-10-CM | POA: Diagnosis not present

## 2021-06-07 DIAGNOSIS — E871 Hypo-osmolality and hyponatremia: Secondary | ICD-10-CM | POA: Diagnosis not present

## 2021-06-07 DIAGNOSIS — Z8673 Personal history of transient ischemic attack (TIA), and cerebral infarction without residual deficits: Secondary | ICD-10-CM

## 2021-06-07 DIAGNOSIS — I6389 Other cerebral infarction: Secondary | ICD-10-CM

## 2021-06-07 DIAGNOSIS — R2681 Unsteadiness on feet: Secondary | ICD-10-CM

## 2021-06-07 LAB — LIPID PANEL
Cholesterol: 214 mg/dL — ABNORMAL HIGH (ref 0–200)
HDL: 70 mg/dL (ref >40)
LDL Cholesterol: 128 mg/dL — ABNORMAL HIGH (ref 0–99)
Total CHOL/HDL Ratio: 3.1 RATIO
Triglycerides: 82 mg/dL (ref <150)
VLDL: 16 mg/dL (ref 0–40)

## 2021-06-07 LAB — URINALYSIS, COMPLETE (UACMP) WITH MICROSCOPIC
Bacteria, UA: NONE SEEN
Bilirubin Urine: NEGATIVE
Glucose, UA: NEGATIVE mg/dL
Ketones, ur: NEGATIVE mg/dL
Leukocytes,Ua: NEGATIVE
Nitrite: NEGATIVE
Protein, ur: 100 mg/dL — AB
Specific Gravity, Urine: 1.02 (ref 1.005–1.030)
pH: 7 (ref 5.0–8.0)

## 2021-06-07 LAB — HEMOGLOBIN A1C
Hgb A1c MFr Bld: 5.7 % — ABNORMAL HIGH (ref 4.8–5.6)
Mean Plasma Glucose: 116.89 mg/dL

## 2021-06-07 LAB — COMPREHENSIVE METABOLIC PANEL
ALT: 12 U/L (ref 0–44)
AST: 23 U/L (ref 15–41)
Albumin: 3.8 g/dL (ref 3.5–5.0)
Alkaline Phosphatase: 84 U/L (ref 38–126)
Anion gap: 10 (ref 5–15)
BUN: 24 mg/dL — ABNORMAL HIGH (ref 8–23)
CO2: 23 mmol/L (ref 22–32)
Calcium: 9.6 mg/dL (ref 8.9–10.3)
Chloride: 99 mmol/L (ref 98–111)
Creatinine, Ser: 1.87 mg/dL — ABNORMAL HIGH (ref 0.44–1.00)
GFR, Estimated: 28 mL/min — ABNORMAL LOW (ref >60)
Glucose, Bld: 109 mg/dL — ABNORMAL HIGH (ref 70–99)
Potassium: 4.7 mmol/L (ref 3.5–5.1)
Sodium: 132 mmol/L — ABNORMAL LOW (ref 135–145)
Total Bilirubin: 0.7 mg/dL (ref 0.3–1.2)
Total Protein: 6.9 g/dL (ref 6.5–8.1)

## 2021-06-07 LAB — MAGNESIUM
Magnesium: 2 mg/dL (ref 1.7–2.4)
Magnesium: 2 mg/dL (ref 1.7–2.4)

## 2021-06-07 LAB — SODIUM, URINE, RANDOM: Sodium, Ur: 103 mmol/L

## 2021-06-07 LAB — TROPONIN I (HIGH SENSITIVITY): Troponin I (High Sensitivity): 9 ng/L (ref <18)

## 2021-06-07 LAB — CBC
HCT: 32.1 % — ABNORMAL LOW (ref 36.0–46.0)
Hemoglobin: 10.9 g/dL — ABNORMAL LOW (ref 12.0–15.0)
MCH: 31.1 pg (ref 26.0–34.0)
MCHC: 34 g/dL (ref 30.0–36.0)
MCV: 91.7 fL (ref 80.0–100.0)
Platelets: 233 10*3/uL (ref 150–400)
RBC: 3.5 MIL/uL — ABNORMAL LOW (ref 3.87–5.11)
RDW: 12.4 % (ref 11.5–15.5)
WBC: 7.8 10*3/uL (ref 4.0–10.5)
nRBC: 0 % (ref 0.0–0.2)

## 2021-06-07 LAB — ECHOCARDIOGRAM COMPLETE BUBBLE STUDY
AR max vel: 2.57 cm2
AV Peak grad: 11.1 mmHg
Ao pk vel: 1.67 m/s
Area-P 1/2: 3.39 cm2
S' Lateral: 3.2 cm

## 2021-06-07 LAB — CREATININE, URINE, RANDOM: Creatinine, Urine: 29 mg/dL

## 2021-06-07 IMAGING — MR MR MRA HEAD W/O CM
1 series · 20 of 48 positions shown · non-contrast
Comparison: No pertinent prior exam.

CLINICAL DATA: Left arm weakness and facial tingling

EXAM:
MRA HEAD WITHOUT CONTRAST
TECHNIQUE: Angiographic images of the Circle of Willis were acquired using MRA
technique without intravenous contrast.

[Series 9: TOF · axial · 0.5mm · 0.41mm/px · z∈[-37,+66]mm · 20 of 224 slices shown]
[im 1/224]
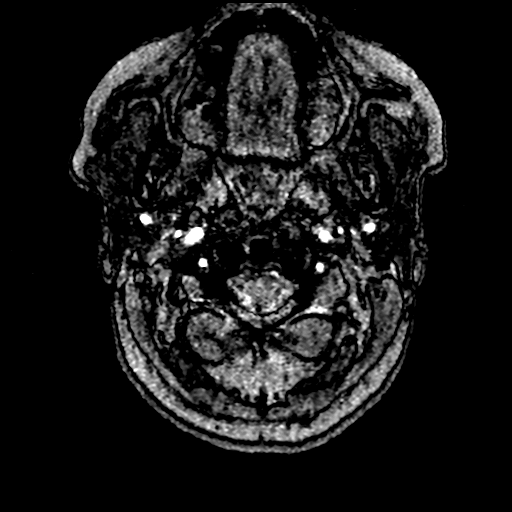
[im 5/224]
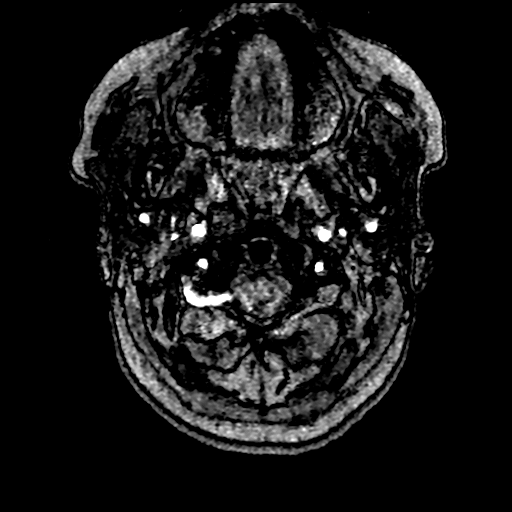
[im 10/224]
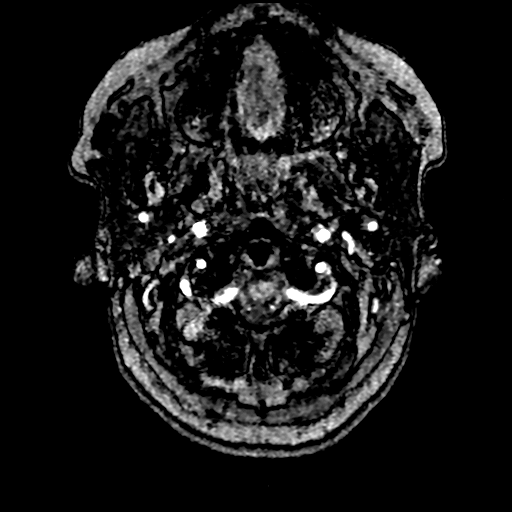
[im 15/224]
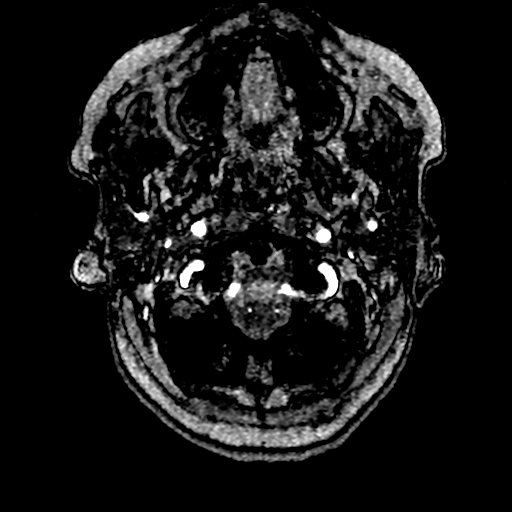
[im 19/224]
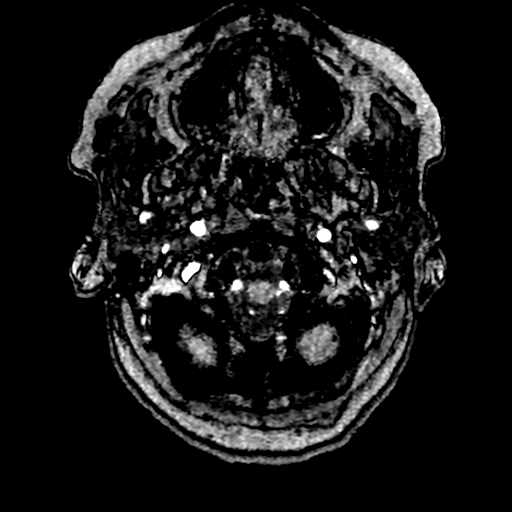
[im 24/224]
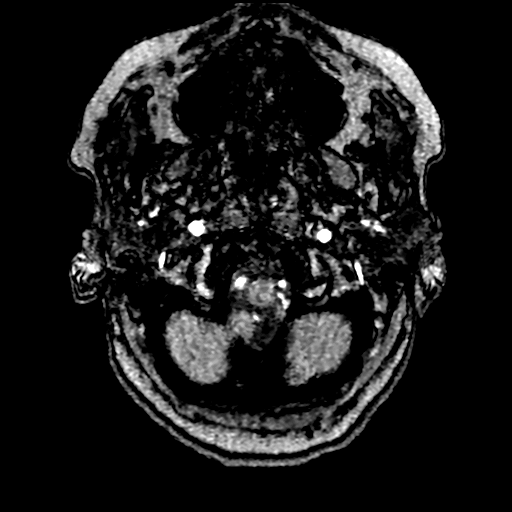
[im 29/224]
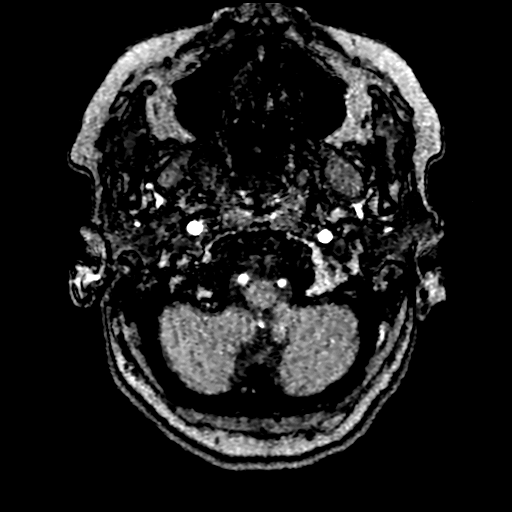
[im 34/224]
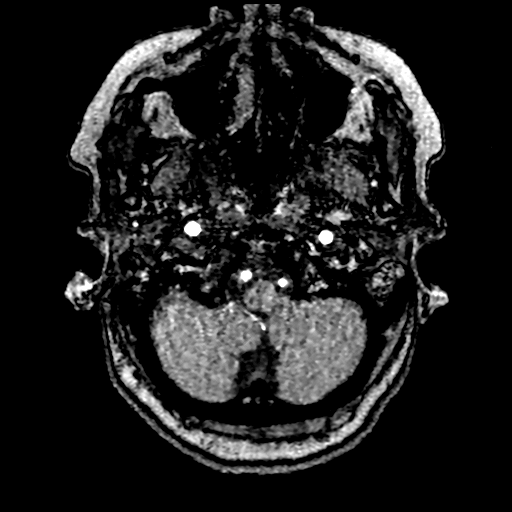
[im 38/224]
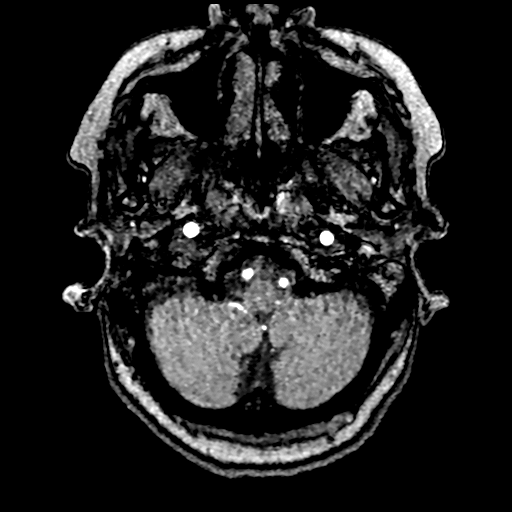
[im 43/224]
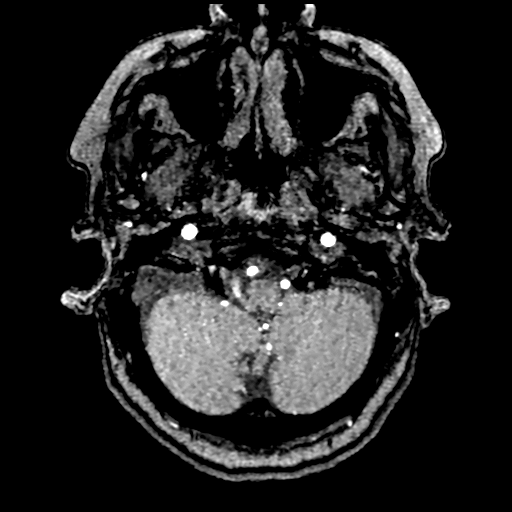
[im 48/224]
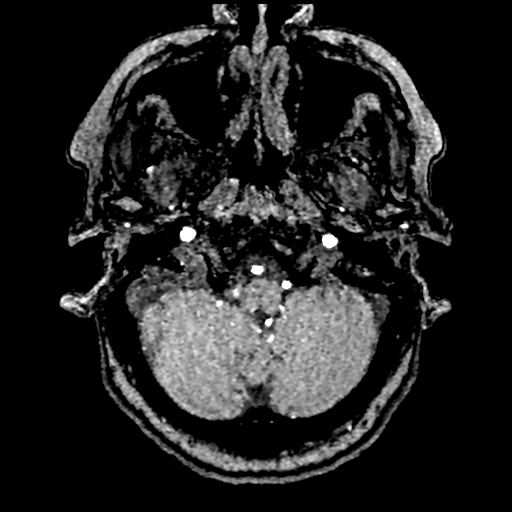
[im 53/224]
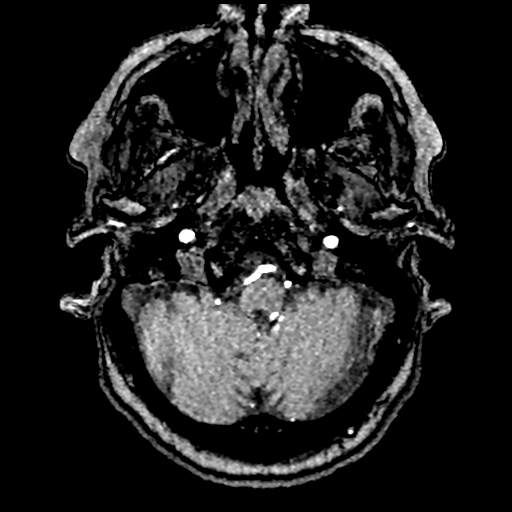
[im 72/224]
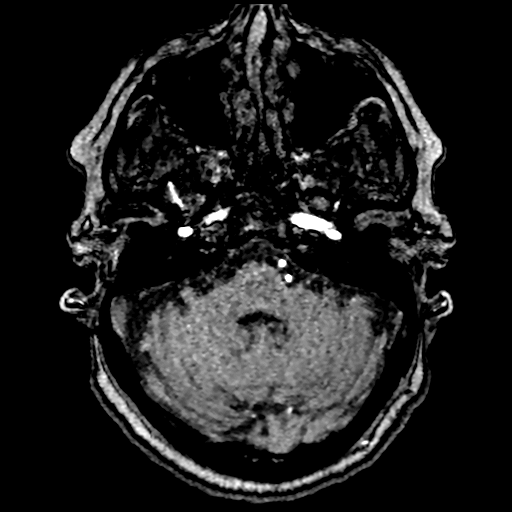
[im 100/224]
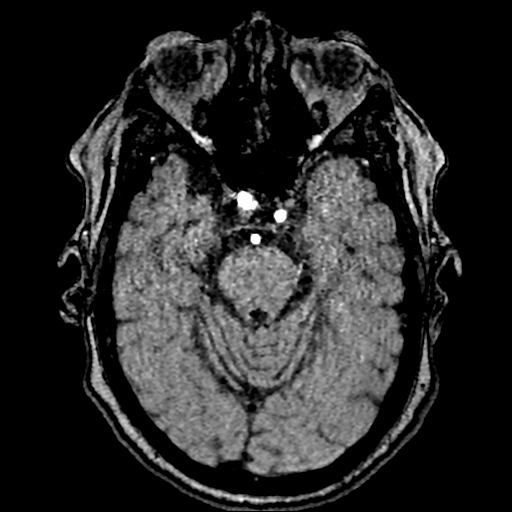
[im 114/224]
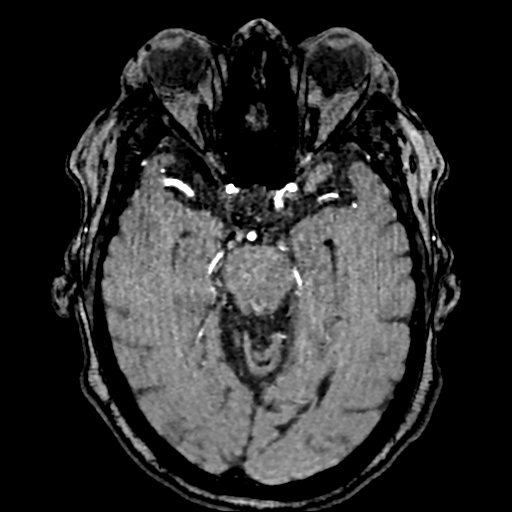
[im 129/224]
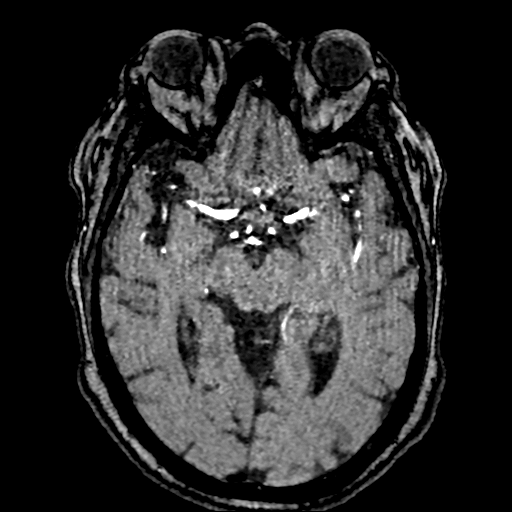
[im 157/224]
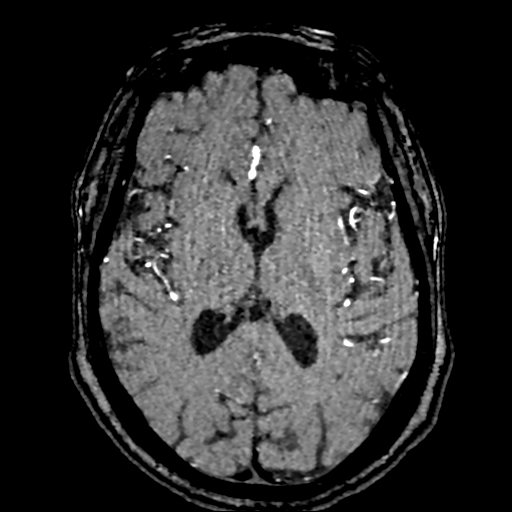
[im 186/224]
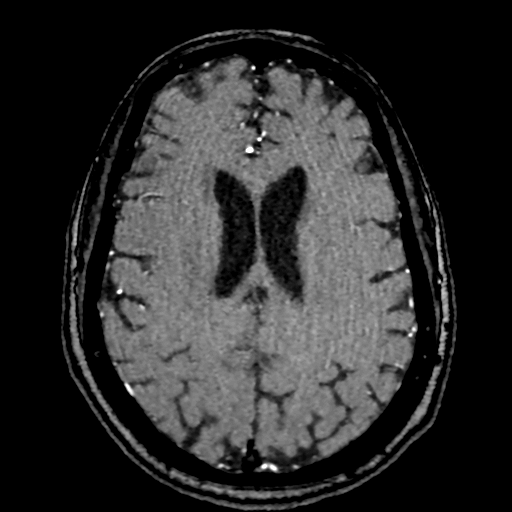
[im 190/224]
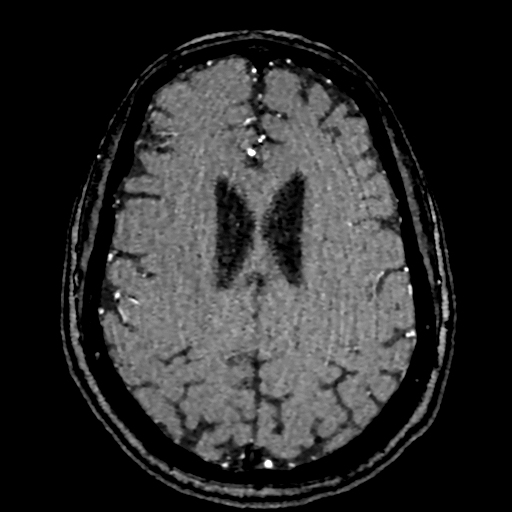
[im 214/224]
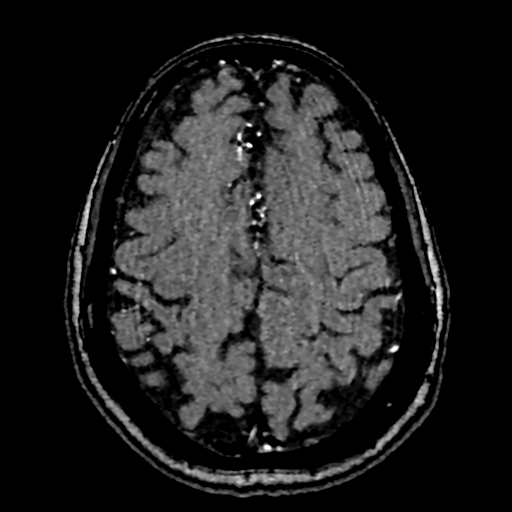

[20 of 48 positions shown; findings below may reference images not displayed]

FINDINGS: POSTERIOR CIRCULATION:

--Vertebral arteries: Normal

--Inferior cerebellar arteries: Normal.

--Basilar artery: Normal.

--Superior cerebellar arteries: Normal.

--Posterior cerebral arteries: Normal.

ANTERIOR CIRCULATION:

--Intracranial internal carotid arteries: Normal.

--Anterior cerebral arteries (ACA): Normal.

--Middle cerebral arteries (MCA): Normal.

ANATOMIC VARIANTS: Fetal origin of the right PCA.
IMPRESSION: Normal intracranial MRA.

## 2021-06-07 IMAGING — US US CAROTID DUPLEX BILAT
1 series · 13 of 24 positions shown · non-contrast
Comparison: [DATE]

CLINICAL DATA: Acute neurologic deficit.

EXAM:
BILATERAL CAROTID DUPLEX ULTRASOUND
TECHNIQUE: Gray scale imaging, color Doppler and duplex ultrasound were
performed of bilateral carotid and vertebral arteries in the neck.

[Series 1: us carotid bilateral · 13 of 64 slices shown]
[im 1/64]
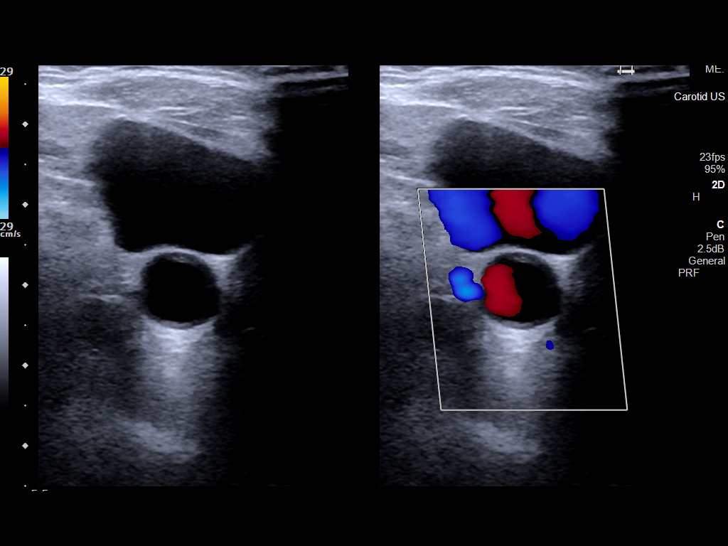
[im 6/64]
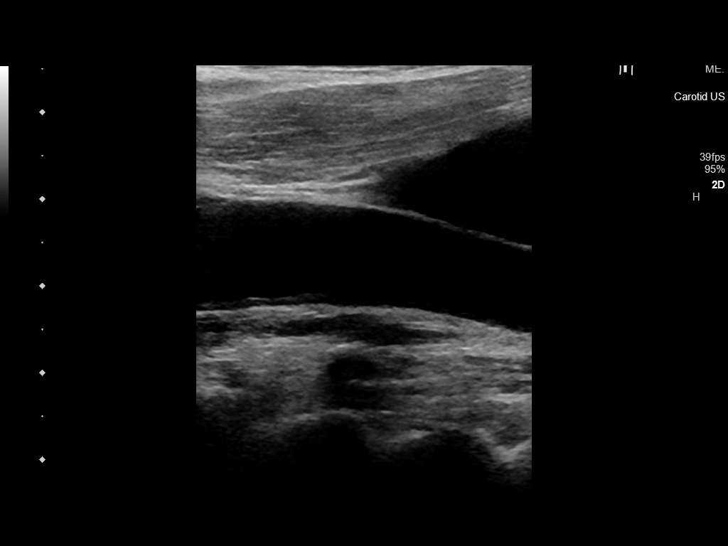
[im 11/64]
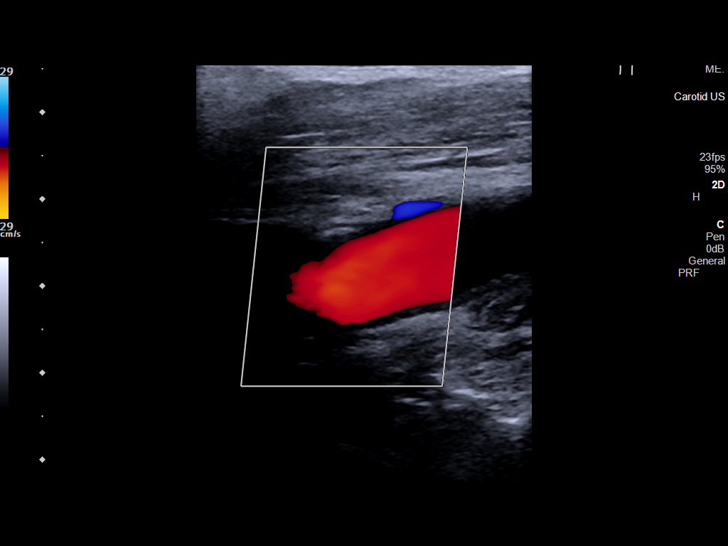
[im 17/64]
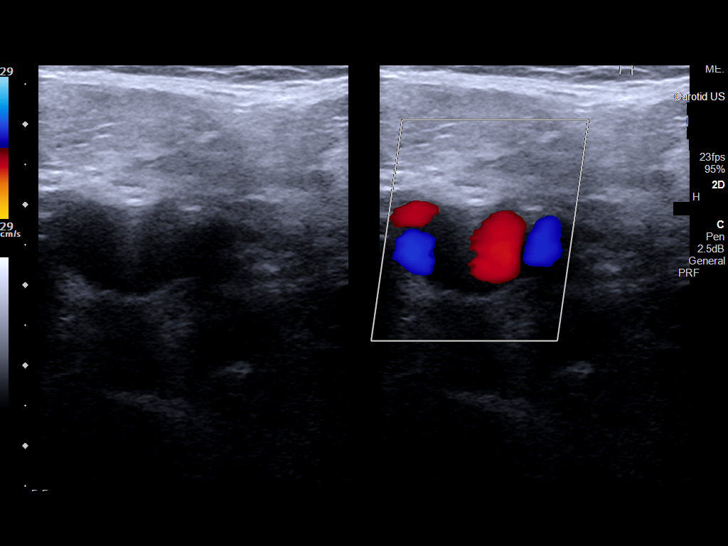
[im 22/64]
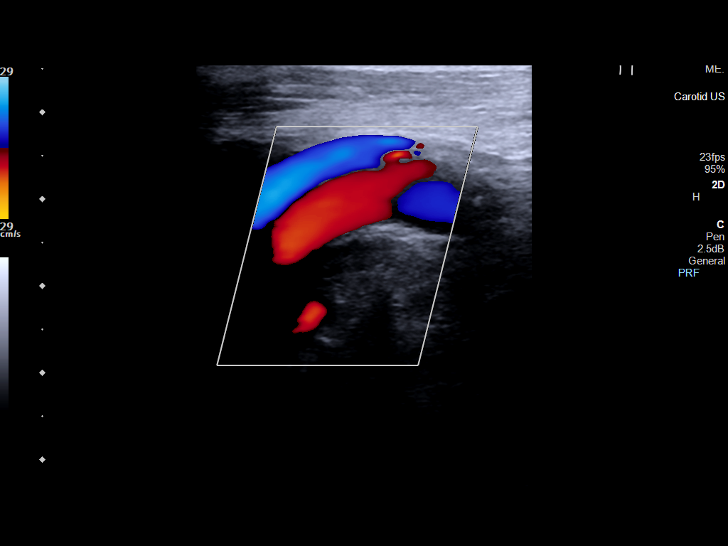
[im 28/64]
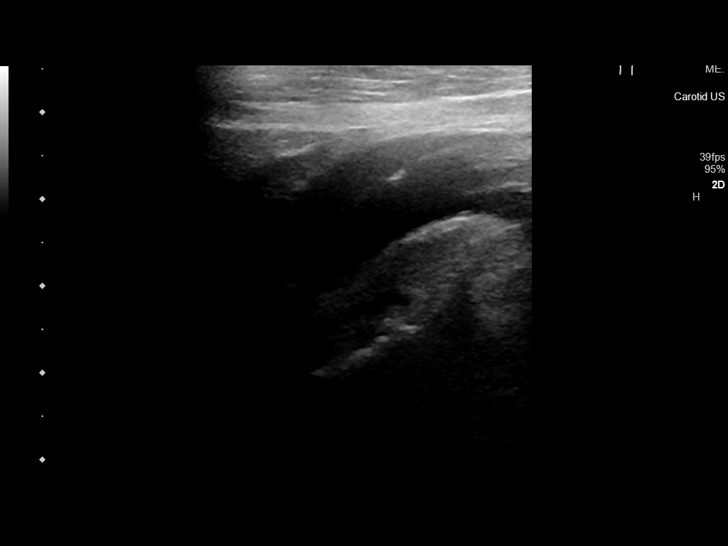
[im 33/64]
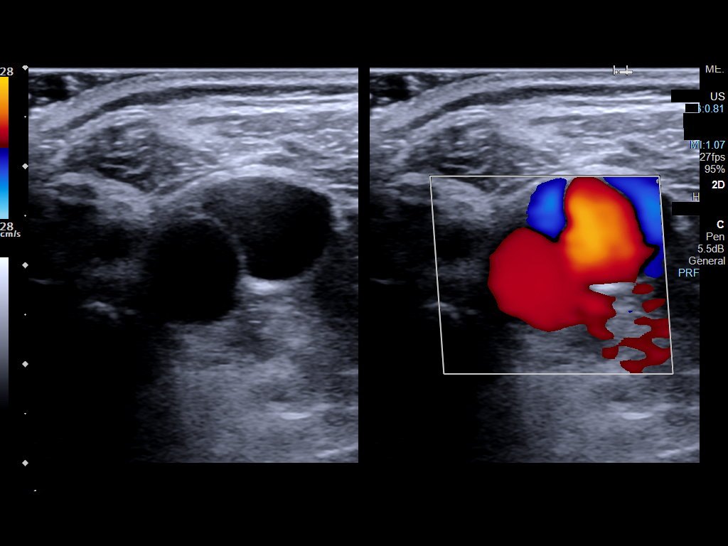
[im 36/64]
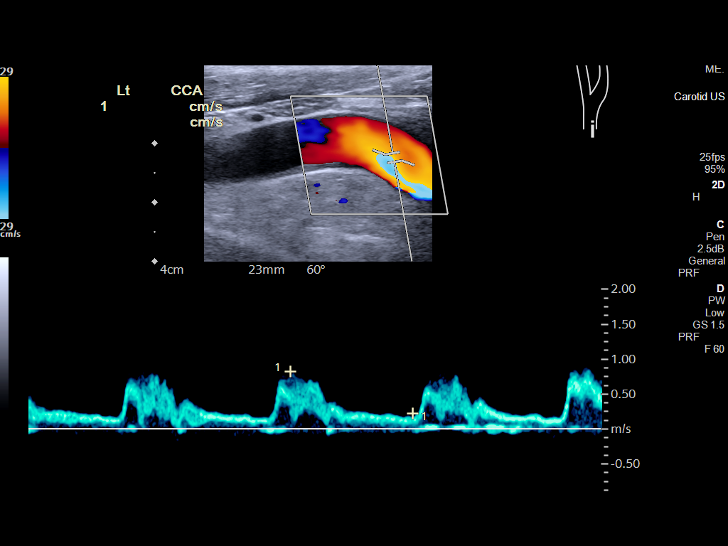
[im 42/64]
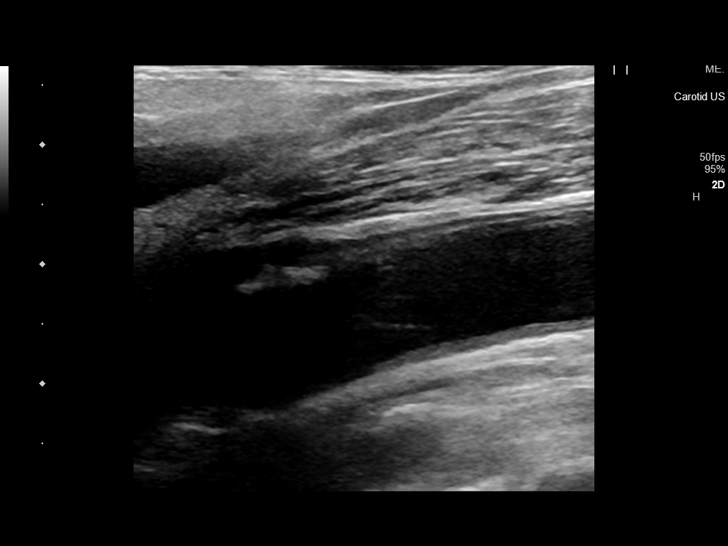
[im 47/64]
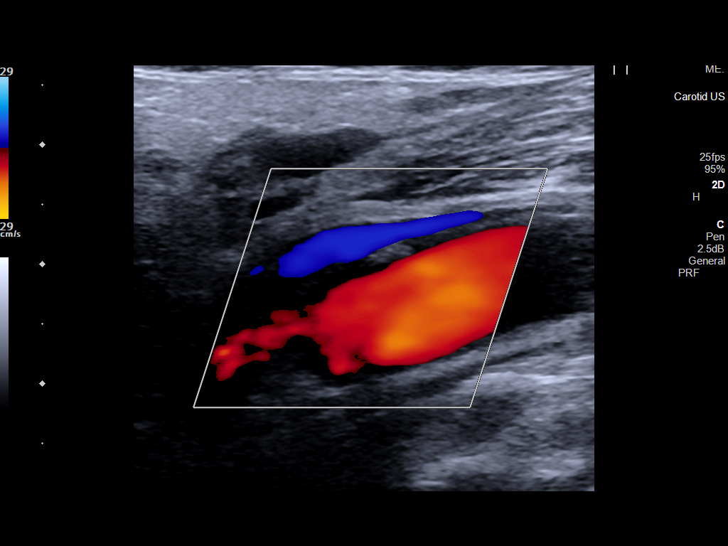
[im 53/64]
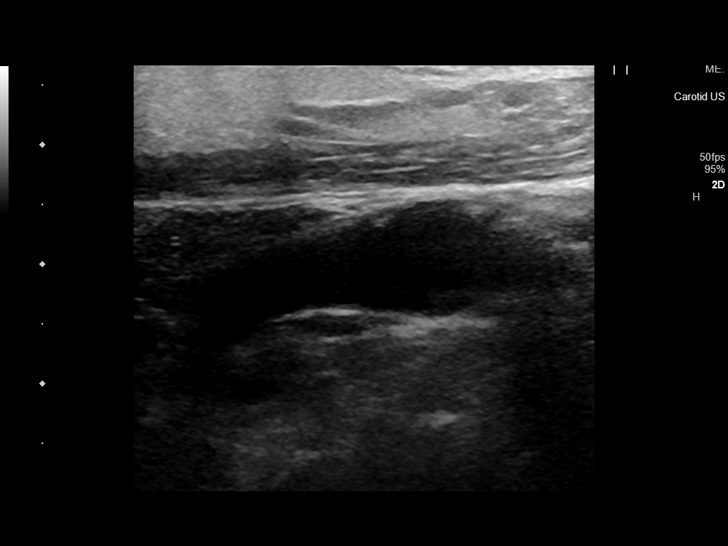
[im 58/64]
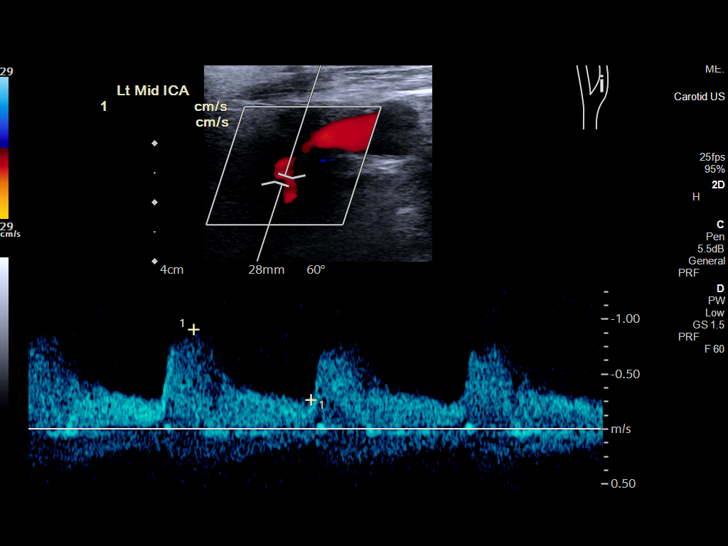
[im 64/64]
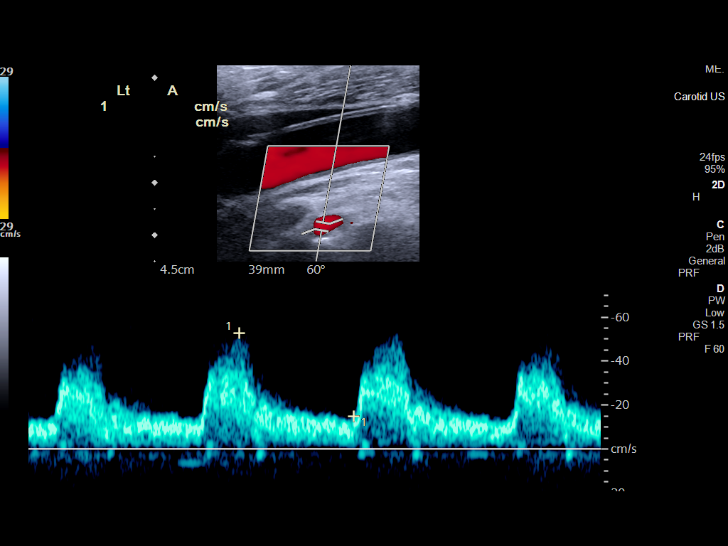

[13 of 24 positions shown; findings below may reference images not displayed]

FINDINGS: Criteria: Quantification of carotid stenosis is based on velocity
parameters that correlate the residual internal carotid diameter
with NASCET-based stenosis levels, using the diameter of the distal
internal carotid lumen as the denominator for stenosis measurement.

The following velocity measurements were obtained:

RIGHT

ICA: 122/41 cm/sec

CCA: 73/16 cm/sec

SYSTOLIC ICA/CCA RATIO:

ECA: 67 cm/sec

LEFT

ICA: 109/29 cm/sec

CCA: 57/11 cm/sec

SYSTOLIC ICA/CCA RATIO:

ECA: 71 cm/sec

RIGHT CAROTID ARTERY: Mild atheromatous plaque at the bifurcation.

RIGHT VERTEBRAL ARTERY: Patent with antegrade flow and normal
waveform

LEFT CAROTID ARTERY:  Mild atheromatous plaque at the bifurcation

LEFT VERTEBRAL ARTERY: Patent with antegrade flow and normal
waveform
IMPRESSION: 1. Mild calcified plaque at the carotid bifurcations. No evidence of
hemodynamically significant stenosis.
2. Antegrade flow in the vertebral arteries.

## 2021-06-07 IMAGING — MR MR HEAD W/O CM
12 of 13 series · 41 of 48 positions shown · non-contrast
Comparison: None.

CLINICAL DATA: Acute neurologic deficit

EXAM:
MRI HEAD WITHOUT CONTRAST
TECHNIQUE: Multiplanar, multiecho pulse sequences of the brain and surrounding
structures were obtained without intravenous contrast.

[Series 5: ax dwi_tracew · axial · 3.0mm · 0.65mm/px · z∈[-36,+131]mm · 6 of 104 slices shown]
[im 1/104]
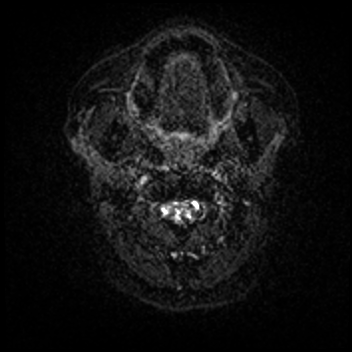
[im 21/104]
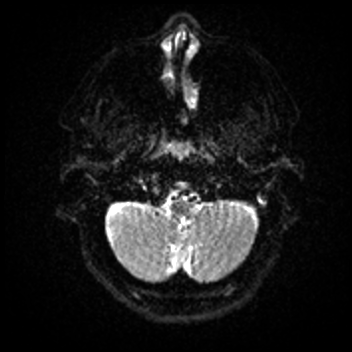
[im 42/104]
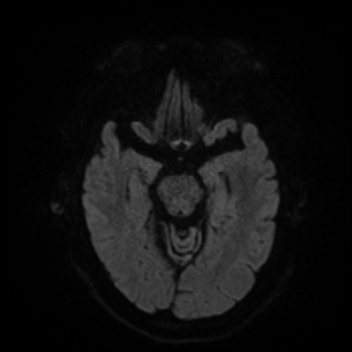
[im 62/104]
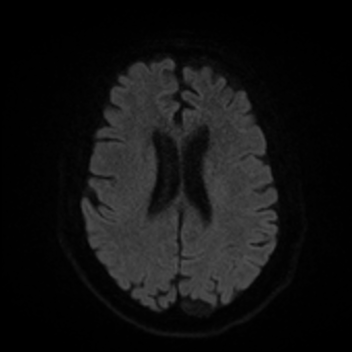
[im 83/104]
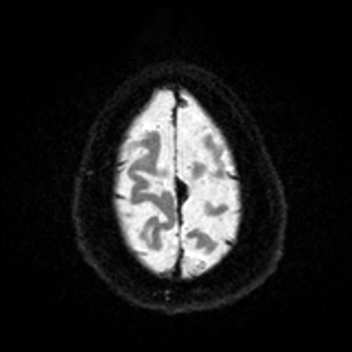
[im 104/104]
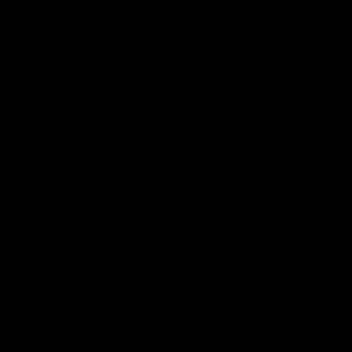

[Series 6: ax dwi_adc · axial · 3.0mm · 0.65mm/px · z∈[-36,+114]mm · 2 of 47 slices shown]
[im 1/47]
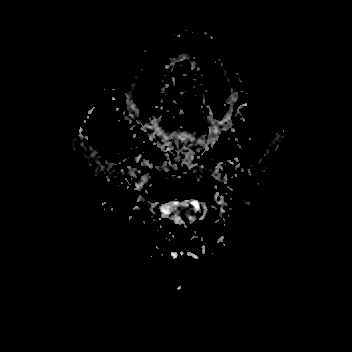
[im 47/47]
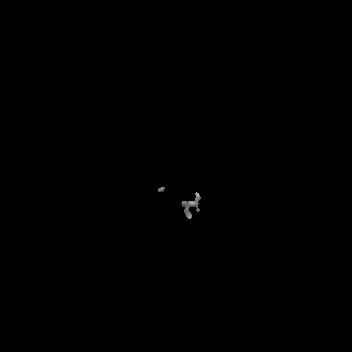

[Series 7: cor dwi_tracew · coronal · 5.0mm · 0.60mm/px · 5 of 88 slices shown]
[im 1/88]
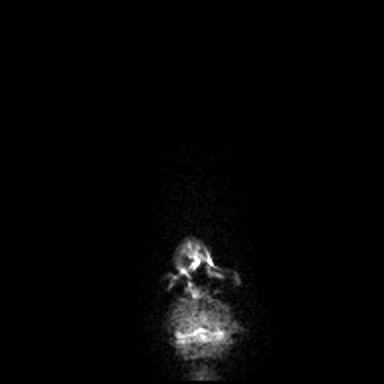
[im 22/88]
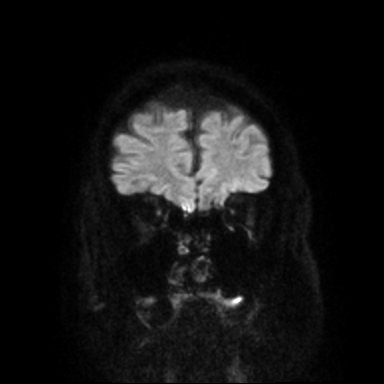
[im 44/88]
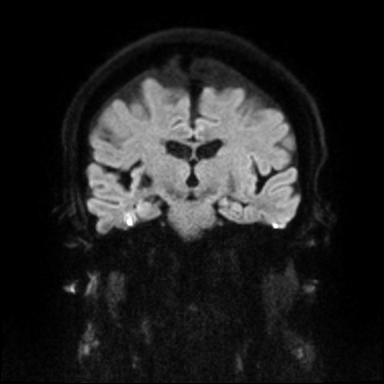
[im 66/88]
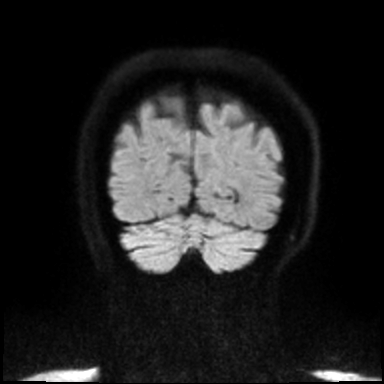
[im 88/88]
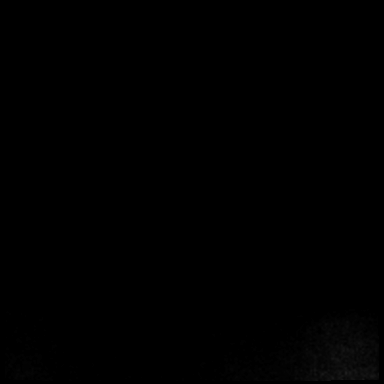

[Series 8: cor dwi_adc · coronal · 5.0mm · 0.60mm/px · 2 of 41 slices shown]
[im 1/41]
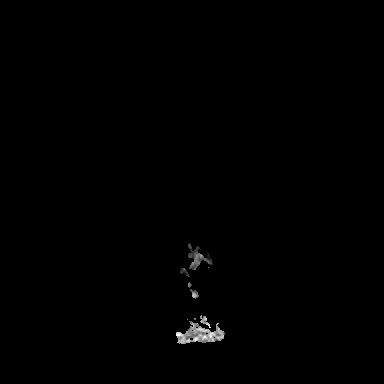
[im 41/41]
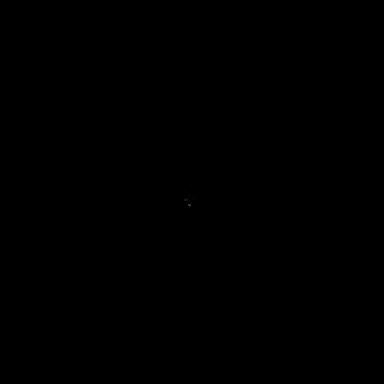

[Series 9: T1 · sagittal · 5.0mm · 0.62mm/px · 1 of 26 slices shown (1 of 2)]
[im 1/26]
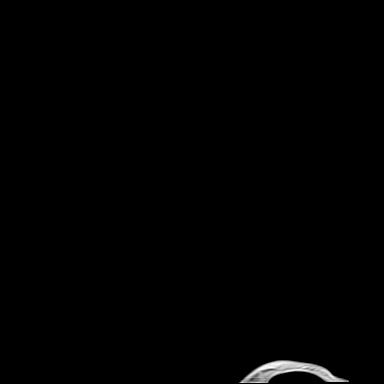

[Series 10: T2 · axial · 5.0mm · 0.53mm/px · z∈[-43,+130]mm · 2 of 30 slices shown (1 of 2)]
[im 1/30]
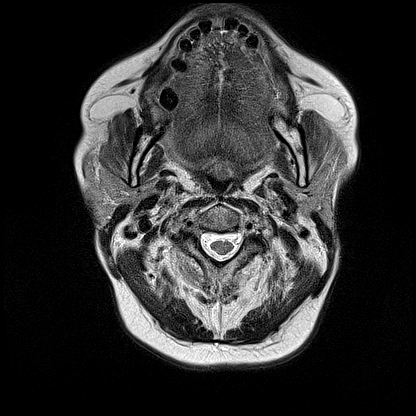
[im 30/30]
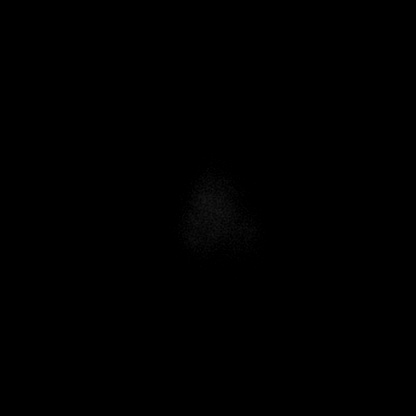

[Series 11: mag_images · axial · 3.0mm · 0.90mm/px · z∈[-45,+131]mm · 3 of 60 slices shown]
[im 1/60]
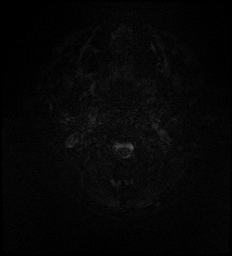
[im 30/60]
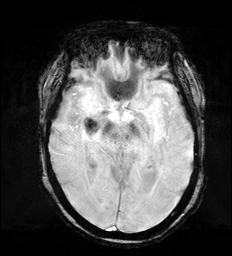
[im 60/60]
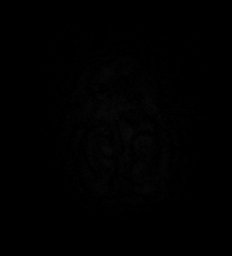

[Series 12: pha_images · axial · 3.0mm · 0.90mm/px · z∈[-42,+131]mm · 3 of 59 slices shown]
[im 1/59]
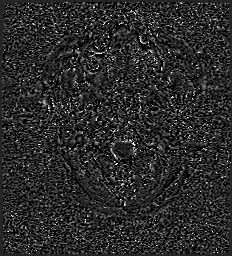
[im 30/59]
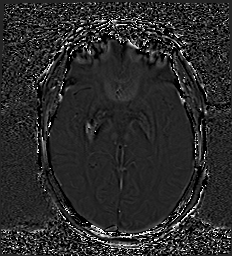
[im 59/59]
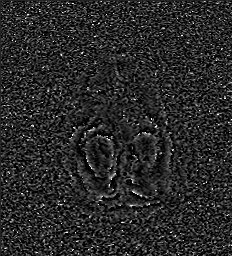

[Series 13: swi_images · axial · 3.0mm · 0.90mm/px · z∈[-45,+131]mm · 3 of 60 slices shown]
[im 1/60]
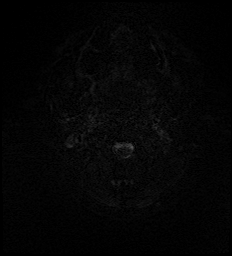
[im 30/60]
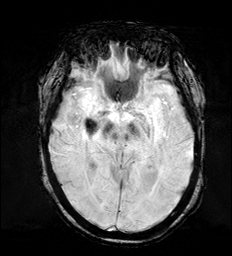
[im 60/60]
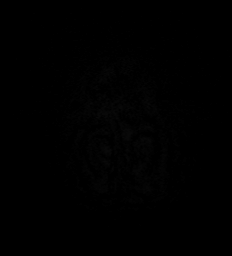

[Series 15: FLAIR · axial · 3.0mm · 0.53mm/px · z∈[-37,+124]mm · 3 of 55 slices shown]
[im 1/55]
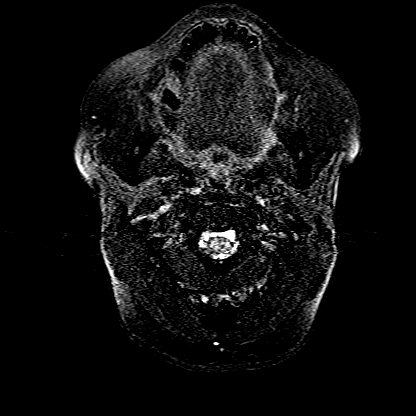
[im 28/55]
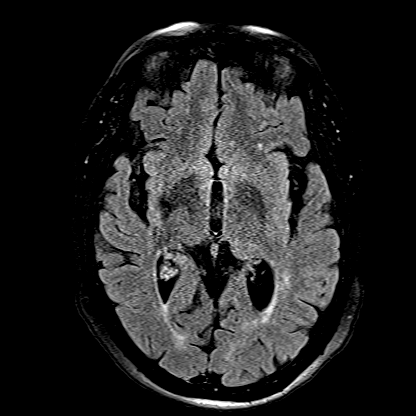
[im 55/55]
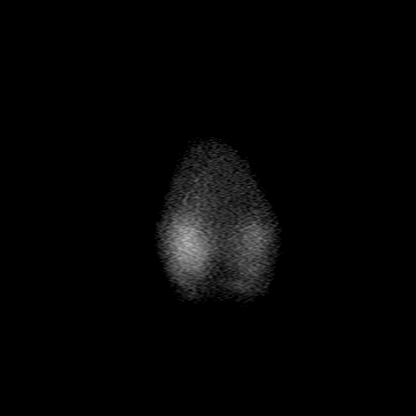

[Series 16: T1 · axial · 1.0mm · 0.98mm/px · z∈[-43,+130]mm · 9 of 175 slices shown (2 of 2)]
[im 1/175]
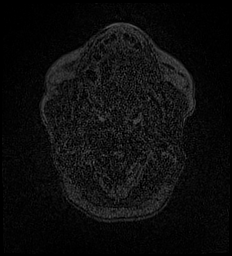
[im 22/175]
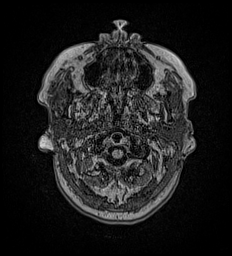
[im 44/175]
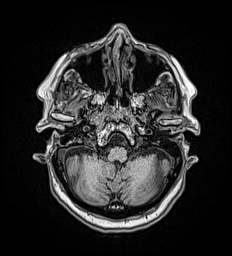
[im 66/175]
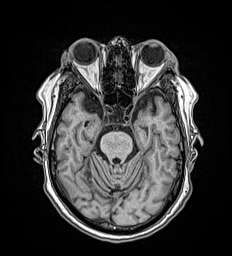
[im 88/175]
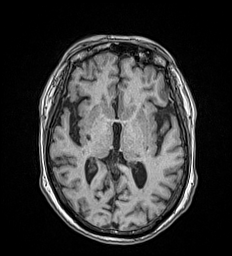
[im 109/175]
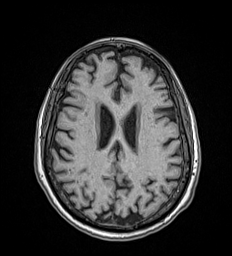
[im 131/175]
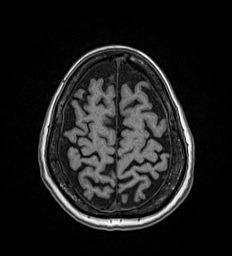
[im 153/175]
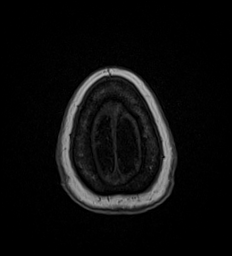
[im 175/175]
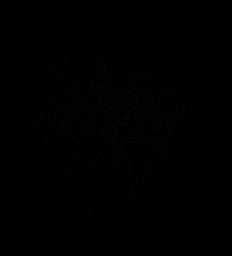

[Series 17: T2 · coronal · 5.0mm · 0.57mm/px · 2 of 32 slices shown (2 of 2)]
[im 1/32]
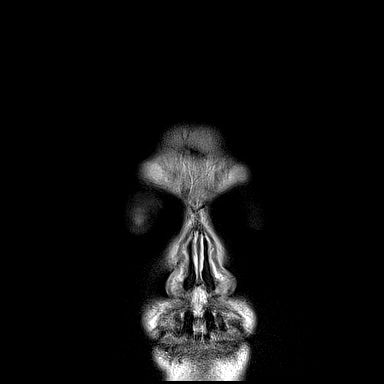
[im 32/32]
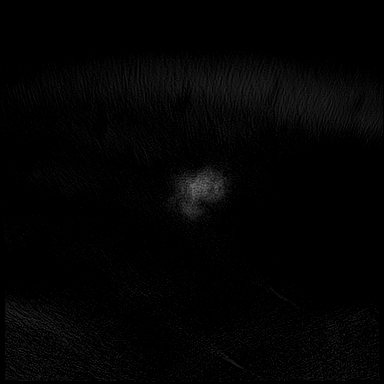

[41 of 48 positions shown; findings below may reference images not displayed]

FINDINGS: Brain: No acute infarct, mass effect or extra-axial collection. No
acute or chronic hemorrhage. There is multifocal hyperintense
T2-weighted signal within the white matter. Parenchymal volume and
CSF spaces are normal. A partially empty sella is incidentally
noted.

Vascular: Major flow voids are preserved.

Skull and upper cervical spine: Normal calvarium and skull base.
Visualized upper cervical spine and soft tissues are normal.

Sinuses/Orbits:No paranasal sinus fluid levels or advanced mucosal
thickening. No mastoid or middle ear effusion. Normal orbits.
IMPRESSION: 1. No acute intracranial abnormality.
2. Findings of chronic small vessel ischemia.

## 2021-06-07 MED ORDER — ONDANSETRON HCL 4 MG/2ML IJ SOLN
4.0000 mg | Freq: Four times a day (QID) | INTRAMUSCULAR | Status: DC | PRN
  Administered 2021-06-07 – 2021-06-09 (×4): 4 mg via INTRAVENOUS
  Filled 2021-06-07 (×5): qty 2

## 2021-06-07 MED ORDER — HYDRALAZINE HCL 20 MG/ML IJ SOLN
10.0000 mg | INTRAMUSCULAR | Status: DC | PRN
  Administered 2021-06-07: 10 mg via INTRAVENOUS
  Filled 2021-06-07: qty 1

## 2021-06-07 MED ORDER — LEVOTHYROXINE SODIUM 50 MCG PO TABS
75.0000 ug | ORAL_TABLET | Freq: Every day | ORAL | Status: DC
  Administered 2021-06-07 – 2021-06-17 (×10): 75 ug via ORAL
  Filled 2021-06-07 (×3): qty 1
  Filled 2021-06-07: qty 2
  Filled 2021-06-07 (×3): qty 1
  Filled 2021-06-07: qty 2
  Filled 2021-06-07 (×2): qty 1

## 2021-06-07 MED ORDER — FAMOTIDINE 20 MG PO TABS
20.0000 mg | ORAL_TABLET | Freq: Every day | ORAL | Status: DC
  Administered 2021-06-07 – 2021-06-08 (×2): 20 mg via ORAL
  Filled 2021-06-07 (×2): qty 1

## 2021-06-07 MED ORDER — IRBESARTAN 150 MG PO TABS
300.0000 mg | ORAL_TABLET | Freq: Every day | ORAL | Status: DC
  Administered 2021-06-07 – 2021-06-13 (×7): 300 mg via ORAL
  Filled 2021-06-07 (×8): qty 2

## 2021-06-07 MED ORDER — CLONIDINE HCL 0.1 MG PO TABS
0.2000 mg | ORAL_TABLET | Freq: Two times a day (BID) | ORAL | Status: DC
  Administered 2021-06-07: 0.2 mg via ORAL
  Filled 2021-06-07: qty 2

## 2021-06-07 MED ORDER — CLOPIDOGREL BISULFATE 75 MG PO TABS
75.0000 mg | ORAL_TABLET | Freq: Every day | ORAL | Status: DC
  Administered 2021-06-07 – 2021-06-17 (×10): 75 mg via ORAL
  Filled 2021-06-07 (×10): qty 1

## 2021-06-07 MED ORDER — ATORVASTATIN CALCIUM 20 MG PO TABS
40.0000 mg | ORAL_TABLET | Freq: Every day | ORAL | Status: DC
  Administered 2021-06-07 – 2021-06-16 (×10): 40 mg via ORAL
  Filled 2021-06-07 (×10): qty 2

## 2021-06-07 MED ORDER — CLONIDINE HCL 0.1 MG PO TABS
0.1000 mg | ORAL_TABLET | Freq: Two times a day (BID) | ORAL | Status: DC
  Administered 2021-06-08: 0.1 mg via ORAL
  Filled 2021-06-07: qty 1

## 2021-06-07 MED ORDER — HEPARIN SODIUM (PORCINE) 5000 UNIT/ML IJ SOLN
5000.0000 [IU] | Freq: Three times a day (TID) | INTRAMUSCULAR | Status: DC
  Administered 2021-06-08 – 2021-06-17 (×23): 5000 [IU] via SUBCUTANEOUS
  Filled 2021-06-07 (×24): qty 1

## 2021-06-07 MED ORDER — CARVEDILOL 6.25 MG PO TABS
12.5000 mg | ORAL_TABLET | Freq: Two times a day (BID) | ORAL | Status: DC
  Administered 2021-06-07: 12.5 mg via ORAL
  Filled 2021-06-07: qty 2

## 2021-06-07 MED ORDER — ALUM & MAG HYDROXIDE-SIMETH 200-200-20 MG/5ML PO SUSP
30.0000 mL | Freq: Three times a day (TID) | ORAL | Status: DC | PRN
  Administered 2021-06-08 – 2021-06-15 (×4): 30 mL via ORAL
  Filled 2021-06-07 (×4): qty 30

## 2021-06-07 MED ORDER — HYDRALAZINE HCL 50 MG PO TABS
25.0000 mg | ORAL_TABLET | Freq: Four times a day (QID) | ORAL | Status: DC | PRN
  Administered 2021-06-07: 25 mg via ORAL
  Filled 2021-06-07: qty 1

## 2021-06-07 MED ORDER — FUROSEMIDE 40 MG PO TABS
40.0000 mg | ORAL_TABLET | Freq: Every day | ORAL | Status: DC
  Administered 2021-06-07 – 2021-06-08 (×2): 40 mg via ORAL
  Filled 2021-06-07 (×2): qty 1

## 2021-06-07 NOTE — ED Notes (Signed)
Patient ambulated in room and placed on inpatient bed.

## 2021-06-07 NOTE — ED Notes (Signed)
MRI called ETA 30 mins

## 2021-06-07 NOTE — ED Notes (Signed)
Pt given hydrALAZINE. 10 mins later started vomiting. MD aware. Treatment updated

## 2021-06-07 NOTE — Progress Notes (Signed)
*  PRELIMINARY RESULTS* Echocardiogram 2D Echocardiogram has been performed.  Betty Nielsen 06/07/2021, 12:11 PM

## 2021-06-07 NOTE — Evaluation (Signed)
Occupational Therapy Evaluation Patient Details Name: Betty Nielsen MRN: [id number] DOB: 11-27-1945 Today's Date: 06/07/2021   History of Present Illness Pt is a 75 y/o F admitted on 06/06/21 with c/o LUE & L side numbness. MRI reveals no acute intracranial abnormality. PMH: acute ischemic CVA Feb. 2021, HTN, HLD, CKD 3B, anemia of CKD, chronic hyponatremia   Clinical Impression   Pt seen for OT evaluation this date. Upon arrival to room, pt using toilet with nurse tech present. Pt agreeable to OT evaluation. Prior to admission, pt was independent in all ADLs and functional mobility, living in a 1-story home alone. Pt currently reporting feeling "woozy" with OOB mobility, and thus requiring SUPERVISION for functional mobility of short household distance (~44f) without AD, SUPERVISION for toilet transfers/hygiene, and SUPERVISION for standing hand hygiene. Pt reporting some numbness in L fingertips, however demonstrating near baseline independence to perform ADL and mobility tasks and no strength, sensory, coordination, cognitive, or visual deficits appreciated with assessment. Upon discharge, recommend SUPERVISION/assist from family until symptoms of wooziness subside. No additional skilled OT needs identified at this time. Will sign off. Please re-consult if additional OT needs arise.     Recommendations for follow up therapy are one component of a multi-disciplinary discharge planning process, led by the attending physician.  Recommendations may be updated based on patient status, additional functional criteria and insurance authorization.   Follow Up Recommendations  No OT follow up;Supervision - Intermittent    Equipment Recommendations  None recommended by OT       Precautions / Restrictions Precautions Precautions: Fall Restrictions Weight Bearing Restrictions: No      Mobility Bed Mobility Overal bed mobility: Modified Independent             General bed mobility comments:  sit>supine with HOB elevated    Transfers Overall transfer level: Needs assistance Equipment used: None Transfers: Sit to/from Stand Sit to Stand: Supervision              Balance Overall balance assessment: Needs assistance Sitting-balance support: No upper extremity supported;Feet supported Sitting balance-Leahy Scale: Good Sitting balance - Comments: Good sitting balance at EOB reaching within BOS   Standing balance support: No upper extremity supported;During functional activity Standing balance-Leahy Scale: Fair Standing balance comment: SUPERVISION for walking without AD d/t pt reporting feeling "woozy"                           ADL either performed or assessed with clinical judgement   ADL Overall ADL's : Needs assistance/impaired     Grooming: Wash/dry hands;Supervision/safety;Standing                   Toilet Transfer: Supervision/safety;Ambulation;Regular Toilet           Functional mobility during ADLs: Supervision/safety General ADL Comments: SUPERVISION d/t pt reporting feeling "woozy"                  Pertinent Vitals/Pain Pain Assessment: No/denies pain Faces Pain Scale: Hurts a little bit Pain Location: BP cuff & IV site Pain Descriptors / Indicators: Discomfort Pain Intervention(s): Monitored during session     Hand Dominance Right   Extremity/Trunk Assessment Upper Extremity Assessment Upper Extremity Assessment: Overall WFL for tasks assessed;LUE deficits/detail LUE Sensation: WNL (Pt endorsing numbness in L fingertips, however WFL) LUE Coordination: WNL   Lower Extremity Assessment Lower Extremity Assessment: Defer to PT evaluation   Cervical / Trunk Assessment Cervical / Trunk Assessment:  Normal   Communication Communication Communication: No difficulties   Cognition Arousal/Alertness: Awake/alert Behavior During Therapy: WFL for tasks assessed/performed Overall Cognitive Status: Within Functional Limits  for tasks assessed                                 General Comments: Pt alert and oriented to self, place, and situation.              Home Living Family/patient expects to be discharged to:: Private residence Living Arrangements: Alone Available Help at Discharge: Family Type of Home: House Home Access: Stairs to enter [employer] of Steps: 2-3 Entrance Stairs-Rails: Right;Left;Can reach both Home Layout: One level     Bathroom Shower/Tub: Tub/shower unit                    Prior Functioning/Environment Level of Independence: Independent        Comments: Independent without AD for mobility and ADLs. Pt drives and cleans home independently        OT Problem List: Impaired balance (sitting and/or standing);Impaired sensation         OT Goals(Current goals can be found in the care plan section) Acute Rehab OT Goals Patient Stated Goal: feel better, go home OT Goal Formulation: With patient Time For Goal Achievement: 06/21/21 Potential to Achieve Goals: Good      End of Session Nurse Communication: Mobility status  Activity Tolerance: Patient tolerated treatment well Patient left: in bed;with call bell/phone within reach;with bed alarm set;with family/visitor present  OT Visit Diagnosis: Unsteadiness on feet (R26.81)                Time: [id number] OT Time Calculation (min): 18 min Charges:  OT General Charges $OT Visit: 1 Visit OT Evaluation $OT Eval Moderate Complexity: [place], OTR/L [place]

## 2021-06-07 NOTE — Evaluation (Signed)
Physical Therapy Evaluation Patient Details Name: Betty Nielsen MRN: [id number] DOB: June 25, 1946 Today's Date: 06/07/2021  History of Present Illness  Pt is a 75 y/o F admitted on 06/06/21 with c/o LUE & L side numbness. MRI reveals No acute intracranial abnormality. 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 Pt endorses "wooziness" with movement but states it does not worsen with activity, although pt with c/o increased nausea at end of session. No focal weakness appreciated, but pt may have some slightly decreased attention to L side environment but need to test further. Pt is able to ambulate without AD but CGA 2/2 "wooziness" & pt occasionally reaching out for UE support. Thoroughly educated pt & son on need for supervision for mobility at home with pt agreeable. Pt also interested in OPPT vs HHPT & would benefit from further balance training to reduce fall risk.  BP checked in RUE: Supine: 162/82 mmHg (MAP 107), Sitting EOB: 176/89 mmHg (MAP 116) Sitting EOB at end of session: 169/102 mmHg (MAP 120)        Recommendations for follow up therapy are one component of a multi-disciplinary discharge planning process, led by the attending physician.  Recommendations may be updated based on patient status, additional functional criteria and insurance authorization.  Follow Up Recommendations Outpatient PT;Supervision for mobility/OOB    Equipment Recommendations  None recommended by PT    Recommendations for Other Services       Precautions / Restrictions Precautions Precautions: Fall Restrictions Weight Bearing Restrictions: No      Mobility  Bed Mobility Overal bed mobility: Modified Independent             General bed mobility comments: supine<>sit with HOB elevated    Transfers Overall transfer level: Needs assistance Equipment used: None Transfers: Sit  to/from Stand Sit to Stand: Min guard;Supervision            Ambulation/Gait Ambulation/Gait assistance: Min guard Gait Distance (Feet): 100 Feet Assistive device: None Gait Pattern/deviations: Decreased stride length Gait velocity: decreased   General Gait Details: Pt requires CGA for gait with pt reporting wooziness & reaching out for UE support occasionally  Stairs            Wheelchair Mobility    Modified Rankin (Stroke Patients Only)       Balance Overall balance assessment: Needs assistance Sitting-balance support: No upper extremity supported;Feet supported Sitting balance-Leahy Scale: Good     Standing balance support: No upper extremity supported;During functional activity Standing balance-Leahy Scale: Fair                               Pertinent Vitals/Pain Pain Assessment: Faces Faces Pain Scale: Hurts a little bit Pain Location: BP cuff & IV site Pain Descriptors / Indicators: Discomfort Pain Intervention(s): Monitored during session    Home Living Family/patient expects to be discharged to:: Private residence Living Arrangements: Alone Available Help at Discharge: Family Type of Home: House Home Access: Stairs to enter Entrance Stairs-Rails: Right;Left;Can reach both Entrance Stairs-Number of Steps: 2-3 Home Layout: One level        Prior Function Level of Independence: Independent         Comments: Independent without AD, driving, cleans     Hand Dominance   Dominant Hand: Right    Extremity/Trunk Assessment   Upper Extremity Assessment  Upper Extremity Assessment: Overall WFL for tasks assessed    Lower Extremity Assessment Lower Extremity Assessment: Overall WFL for tasks assessed       Communication   Communication: No difficulties  Cognition Arousal/Alertness: Awake/alert Behavior During Therapy: WFL for tasks assessed/performed Overall Cognitive Status: Within Functional Limits for tasks assessed                                         General Comments General comments (skin integrity, edema, etc.): Possibly decreased L attention to environment. Increased nausea at end of session, nurse notified of pt's c/o.    Exercises     Assessment/Plan    PT Assessment Patient needs continued PT services  PT Problem List Decreased mobility;Decreased activity tolerance;Decreased balance;Decreased knowledge of use of DME;Cardiopulmonary status limiting activity       PT Treatment Interventions DME instruction;Therapeutic exercise;Gait training;Balance training;Stair training;Neuromuscular re-education;Functional mobility training;Therapeutic activities;Patient/family education    PT Goals (Current goals can be found in the Care Plan section)  Acute Rehab PT Goals Patient Stated Goal: feel better, go home PT Goal Formulation: With patient/family Time For Goal Achievement: 06/21/21 Potential to Achieve Goals: Good    Frequency Min 2X/week   Barriers to discharge        Co-evaluation               AM-PAC PT "6 Clicks" Mobility  Outcome Measure Help needed turning from your back to your side while in a flat bed without using bedrails?: None Help needed moving from lying on your back to sitting on the side of a flat bed without using bedrails?: None Help needed moving to and from a bed to a chair (including a wheelchair)?: A Little Help needed standing up from a chair using your arms (e.g., wheelchair or bedside chair)?: A Little Help needed to walk in hospital room?: A Little Help needed climbing 3-5 steps with a railing? : A Little 6 Click Score: 20    End of Session   Activity Tolerance:  (limited by nausea) Patient left: in bed;with bed alarm set Nurse Communication: Mobility status PT Visit Diagnosis: Unsteadiness on feet (R26.81)    Time: [id number] PT Time Calculation (min) (ACUTE ONLY): 16 min   Charges:   PT Evaluation $PT Eval Moderate  Complexity: 1 Mod          Betty Nielsen, PT, DPT 06/07/21, 11:09 AM   Betty Nielsen 06/07/2021, 11:06 AM

## 2021-06-07 NOTE — Progress Notes (Signed)
PROGRESS NOTE  Betty Nielsen [id number] DOB: Nov 17, 1945   PCP: Steele Sizer, MD  Patient is from: Home.  Lives alone.  Independently ambulates at baseline.  DOA: 06/06/2021 LOS: 0  Chief complaints:  Chief Complaint  Patient presents with   Dizziness   Numbness     Brief Narrative / Interim history: 75 year old F with PMH of right lacunar CVA in 10/2019, uncontrolled HTN, CKD-3B, hyponatremia, HLD and anemia of chronic disease presenting with left upper extremity and left facial numbness since 06/05/21 at 9 PM.  Patient was outside tPA window on arrival to ED the next day.  CVA work-up including CT head, MRI brain, MRA head, carotid ultrasound without acute finding.  BP as high as 226/115 while in ED.  Subjective: Seen and examined earlier this morning and later this afternoon.  Still with some residual numbness in her left face and left upper extremity.  She also reports feeling "woozy" that she describes as feeling lightheaded.  She says she feels the same for the last 3 weeks.  Usually happens after taking her blood pressure medications in the morning.  She is concerned about clonidine but her nephrologist wanted her to stay on it.  She also had vague pain across upper abdomen after she had IV hydralazine last night.  Abdominal pain resolved after GI cocktail later this afternoon.  She denies vision change, focal weakness, chest pain, dyspnea or UTI symptoms.  Patient's son at bedside this afternoon.  Objective: Vitals:   06/07/21 1200 06/07/21 1300 06/07/21 1430 06/07/21 1500  BP: (!) 177/85 (!) 179/86 (!) 196/87 (!) 202/95  Pulse: 72 71 74 71  Resp: (!) '21 18 18 18  '$ Temp:      TempSrc:      SpO2: 98% 98% 100% 100%  Weight:      Height:       No intake or output data in the 24 hours ending 06/07/21 1606 Filed Weights   06/06/21 2108  Weight: 84.8 kg    Examination:  GENERAL: No apparent distress.  Nontoxic. HEENT: MMM.  Vision and hearing grossly intact.  NECK:  Supple.  No apparent JVD.  RESP: 100% on RA.  No IWOB.  Fair aeration bilaterally. CVS:  RRR. Heart sounds normal.  ABD/GI/GU: BS+. Abd soft, NTND.  MSK/EXT:  Moves extremities. No apparent deformity. No edema.  SKIN: no apparent skin lesion or wound NEURO: Awake, alert and oriented appropriately. Speech clear but not quite coherent. Cranial nerves II-XII intact. Motor 5/5 in all muscle groups of UE and LE bilaterally, Normal tone. Light sensation intact in all dermatomes of upper and lower ext bilaterally. Patellar reflex symmetric.  No pronator drift.  Finger to nose intact. PSYCH: Calm. Normal affect.   Procedures:  None  Microbiology summarized: [id number] and influenza PCR nonreactive.  Assessment & Plan: LUE/left face paresthesia-due to uncontrolled hypertension?  Neuro exam including sensory exam intact.  CT head, MRI brain, MRA head and bilateral carotid ultrasound without significant finding to explain patient's symptoms.  If blood pressure remains elevated despite resuming some of his antihypertensive meds.  TTE pending.  LDL 128.  A1c 5.7%.  Received full dose aspirin in ED.  She was outside tPA window on arrival. -Continue home Plavix and atorvastatin -Slowly normalize blood pressure -Follow TTE -Continue PT/OT  Uncontrolled hypertension-BP as high as 226/115.  Remains elevated after interval improvement. -Resumed clonidine this morning-not a great choice given her age.  We will try to wean off this -Resumed home Avapro  this afternoon. -Change home labetalol to Coreg.  Start this evening. -Trial of p.o. Lasix cautiously given his CKD-3B -P.o. hydralazine 25 mg every 6 hours as needed -Patient needs outpatient sleep study.  She reports snoring that wakes her up from sleep. -Consider changing once daily antihypertensive meds to bedtime given her dizziness  CKD-3B/azotemia: Stable. Recent Labs    06/18/20 1357 06/06/21 2110 06/07/21 0740  BUN 18 26* 24*  CREATININE 1.82*  1.98* 1.87*  -Continue monitoring  History of right right basal ganglia CVA/hyperlipidemia -Continue Lipitor and Plavix as above -Optimize blood pressure as above  Unsteady gait/dizziness-her neuro exam is reassuring.  This seems to be worse after taking her antihypertensive meds in the morning. -Adjust antihypertensive meds as above -Fall precaution -PT/OT eval  At risk for sleep apnea-likely contributing to her uncontrolled hypertension.  She reports snoring that wakes her up from sleep. -Encouraged to follow-up in sleep clinic for sleep studies  Hypothyroidism -Continue home Synthroid.  GERD: Reports taking Prevacid at home.  Allergy to Protonix? -P.o. Pepcid 20 mg daily -GI cocktail as needed  Body mass index is 31.12 kg/m.         DVT prophylaxis:  SCDs Start: 06/06/21 2340 Start subcu heparin  Code Status: Full code Family Communication: Updated patient's son at bedside. Level of care: Progressive Cardiac Status is: Observation  The patient will require care spanning > 2 midnights and should be moved to inpatient because: Hemodynamically unstable, Ongoing diagnostic testing needed not appropriate for outpatient work up, and Inpatient level of care appropriate due to severity of illness  Dispo: The patient is from: Home              Anticipated d/c is to: Home              Patient currently is not medically stable to d/c.   Difficult to place patient No       Consultants:  None   Sch Meds:  Scheduled Meds:  atorvastatin  40 mg Oral q1800   carvedilol  12.5 mg Oral BID WC   cloNIDine  0.1 mg Oral BID   clopidogrel  75 mg Oral Daily   furosemide  40 mg Oral Daily   irbesartan  300 mg Oral Daily   levothyroxine  75 mcg Oral Q0600   Continuous Infusions: PRN Meds:.acetaminophen **OR** acetaminophen, alum & mag hydroxide-simeth, hydrALAZINE, ondansetron (ZOFRAN) IV  Antimicrobials: Anti-infectives (From admission, onward)    None        I  have personally reviewed the following labs and images: CBC: Recent Labs  Lab 06/06/21 2110 06/07/21 0740  WBC 5.8 7.8  NEUTROABS 2.5  --   HGB 10.0* 10.9*  HCT 29.9* 32.1*  MCV 94.0 91.7  PLT 213 233   BMP &GFR Recent Labs  Lab 06/06/21 2110 06/06/21 2345 06/07/21 0740  NA 131*  --  132*  K 4.4  --  4.7  CL 98  --  99  CO2 25  --  23  GLUCOSE 119*  --  109*  BUN 26*  --  24*  CREATININE 1.98*  --  1.87*  CALCIUM 9.0  --  9.6  MG  --  2.0 2.0   Estimated Creatinine Clearance: 27.9 mL/min (A) (by C-G formula based on SCr of 1.87 mg/dL (H)). Liver & Pancreas: Recent Labs  Lab 06/06/21 2110 06/07/21 0740  AST 20 23  ALT 12 12  ALKPHOS 89 84  BILITOT 0.6 0.7  PROT 6.6 6.9  ALBUMIN 3.7 3.8   No results for input(s): LIPASE, AMYLASE in the last 168 hours. No results for input(s): AMMONIA in the last 168 hours. Diabetic: Recent Labs    06/07/21 0740  HGBA1C 5.7*   Recent Labs  Lab 06/06/21 2110  GLUCAP 114*   Cardiac Enzymes: No results for input(s): CKTOTAL, CKMB, CKMBINDEX, TROPONINI in the last 168 hours. No results for input(s): PROBNP in the last 8760 hours. Coagulation Profile: Recent Labs  Lab 06/06/21 2110  INR 1.0   Thyroid Function Tests: No results for input(s): TSH, T4TOTAL, FREET4, T3FREE, THYROIDAB in the last 72 hours. Lipid Profile: Recent Labs    06/07/21 0740  CHOL 214*  HDL 70  LDLCALC 128*  TRIG 82  CHOLHDL 3.1   Anemia Panel: No results for input(s): VITAMINB12, FOLATE, FERRITIN, TIBC, IRON, RETICCTPCT in the last 72 hours. Urine analysis:    Component Value Date/Time   COLORURINE YELLOW 06/07/2021 0912   APPEARANCEUR CLEAR 06/07/2021 0912   APPEARANCEUR Clear 04/04/2012 1438   LABSPEC 1.020 06/07/2021 0912   LABSPEC 1.004 04/04/2012 1438   PHURINE 7.0 06/07/2021 0912   GLUCOSEU NEGATIVE 06/07/2021 0912   GLUCOSEU Negative 04/04/2012 1438   HGBUR MODERATE (A) 06/07/2021 0912   BILIRUBINUR NEGATIVE 06/07/2021 0912    BILIRUBINUR Negative 04/04/2012 1438   KETONESUR NEGATIVE 06/07/2021 0912   PROTEINUR 100 (A) 06/07/2021 0912   UROBILINOGEN 0.2 04/15/2008 0946   NITRITE NEGATIVE 06/07/2021 0912   LEUKOCYTESUR NEGATIVE 06/07/2021 0912   LEUKOCYTESUR Trace 04/04/2012 1438   Sepsis Labs: Invalid input(s): PROCALCITONIN, [place]  Microbiology: Recent Results (from the past 240 hour(s))  Resp Panel by RT-PCR (Flu A&B, Covid) Nasopharyngeal Swab     Status: None   Collection Time: 06/06/21  9:57 PM   Specimen: Nasopharyngeal Swab; Nasopharyngeal(NP) swabs in vial transport medium  Result Value Ref Range Status   SARS Coronavirus 2 by RT PCR NEGATIVE NEGATIVE Final    Comment: (NOTE) SARS-CoV-2 target nucleic acids are NOT DETECTED.  The SARS-CoV-2 RNA is generally detectable in upper respiratory specimens during the acute phase of infection. The lowest concentration of SARS-CoV-2 viral copies this assay can detect is 138 copies/mL. A negative result does not preclude SARS-Cov-2 infection and should not be used as the sole basis for treatment or other patient management decisions. A negative result may occur with  improper specimen collection/handling, submission of specimen other than nasopharyngeal swab, presence of viral mutation(s) within the areas targeted by this assay, and inadequate number of viral copies(<138 copies/mL). A negative result must be combined with clinical observations, patient history, and epidemiological information. The expected result is Negative.  Fact Sheet for Patients:  EntrepreneurPulse.com.au  Fact Sheet for Healthcare Providers:  IncredibleEmployment.be  This test is no t yet approved or cleared by the [place] FDA and  has been authorized for detection and/or diagnosis of SARS-CoV-2 by FDA under an Emergency Use Authorization (EUA). This EUA will remain  in effect (meaning this test can be used) for the duration of  the COVID-19 declaration under Section 564(b)(1) of the Act, 21 U.S.C.section 360bbb-3(b)(1), unless the authorization is terminated  or revoked sooner.       Influenza A by PCR NEGATIVE NEGATIVE Final   Influenza B by PCR NEGATIVE NEGATIVE Final    Comment: (NOTE) The Xpert Xpress SARS-CoV-2/FLU/RSV plus assay is intended as an aid in the diagnosis of influenza from Nasopharyngeal swab specimens and should not be used as a sole basis for treatment. Nasal washings and  aspirates are unacceptable for Xpert Xpress SARS-CoV-2/FLU/RSV testing.  Fact Sheet for Patients: EntrepreneurPulse.com.au  Fact Sheet for Healthcare Providers: IncredibleEmployment.be  This test is not yet approved or cleared by the [place] FDA and has been authorized for detection and/or diagnosis of SARS-CoV-2 by FDA under an Emergency Use Authorization (EUA). This EUA will remain in effect (meaning this test can be used) for the duration of the COVID-19 declaration under Section 564(b)(1) of the Act, 21 U.S.C. section 360bbb-3(b)(1), unless the authorization is terminated or revoked.  Performed at [facility], [address] [place], [place] [address]     Radiology Studies: CT HEAD WO CONTRAST  Result Date: 06/06/2021 CLINICAL DATA:  Transient ischemic attack EXAM: CT HEAD WITHOUT CONTRAST TECHNIQUE: Contiguous axial images were obtained from the base of the skull through the vertex without intravenous contrast. COMPARISON:  None. FINDINGS: Brain: There is no mass, hemorrhage or extra-axial collection. The size and configuration of the ventricles and extra-axial CSF spaces are normal. The brain parenchyma is normal, without acute or chronic infarction. Vascular: No abnormal hyperdensity of the major intracranial arteries or dural venous sinuses. No intracranial atherosclerosis. Skull: The visualized skull base, calvarium and extracranial soft tissues are  normal. Sinuses/Orbits: No fluid levels or advanced mucosal thickening of the visualized paranasal sinuses. No mastoid or middle ear effusion. The orbits are normal. IMPRESSION: Normal head CT. Electronically Signed   By: Ulyses Jarred M.D.   On: 06/06/2021 21:47   MR ANGIO HEAD WO CONTRAST  Result Date: 06/07/2021 CLINICAL DATA:  Left arm weakness and facial tingling EXAM: MRA HEAD WITHOUT CONTRAST TECHNIQUE: Angiographic images of the Circle of Willis were acquired using MRA technique without intravenous contrast. COMPARISON:  No pertinent prior exam. FINDINGS: POSTERIOR CIRCULATION: --Vertebral arteries: Normal --Inferior cerebellar arteries: Normal. --Basilar artery: Normal. --Superior cerebellar arteries: Normal. --Posterior cerebral arteries: Normal. ANTERIOR CIRCULATION: --Intracranial internal carotid arteries: Normal. --Anterior cerebral arteries (ACA): Normal. --Middle cerebral arteries (MCA): Normal. ANATOMIC VARIANTS: Fetal origin of the right PCA. IMPRESSION: Normal intracranial MRA. Electronically Signed   By: Ulyses Jarred M.D.   On: 06/07/2021 01:31   MR BRAIN WO CONTRAST  Result Date: 06/07/2021 CLINICAL DATA:  Acute neurologic deficit EXAM: MRI HEAD WITHOUT CONTRAST TECHNIQUE: Multiplanar, multiecho pulse sequences of the brain and surrounding structures were obtained without intravenous contrast. COMPARISON:  None. FINDINGS: Brain: No acute infarct, mass effect or extra-axial collection. No acute or chronic hemorrhage. There is multifocal hyperintense T2-weighted signal within the white matter. Parenchymal volume and CSF spaces are normal. A partially empty sella is incidentally noted. Vascular: Major flow voids are preserved. Skull and upper cervical spine: Normal calvarium and skull base. Visualized upper cervical spine and soft tissues are normal. Sinuses/Orbits:No paranasal sinus fluid levels or advanced mucosal thickening. No mastoid or middle ear effusion. Normal orbits. IMPRESSION:  1. No acute intracranial abnormality. 2. Findings of chronic small vessel ischemia. Electronically Signed   By: Ulyses Jarred M.D.   On: 06/07/2021 01:35   US Carotid Bilateral  Result Date: 06/07/2021 CLINICAL DATA:  Acute neurologic deficit. EXAM: BILATERAL CAROTID DUPLEX ULTRASOUND TECHNIQUE: Pearline Cables scale imaging, color Doppler and duplex ultrasound were performed of bilateral carotid and vertebral arteries in the neck. COMPARISON:  10/26/2019 FINDINGS: Criteria: Quantification of carotid stenosis is based on velocity parameters that correlate the residual internal carotid diameter with NASCET-based stenosis levels, using the diameter of the distal internal carotid lumen as the denominator for stenosis measurement. The following velocity measurements were obtained: RIGHT ICA: 122/41  cm/sec CCA: [id number] cm/sec SYSTOLIC ICA/CCA RATIO: 1.7 ECA: 67 cm/sec LEFT ICA: 109/29 cm/sec CCA: [id number] cm/sec SYSTOLIC ICA/CCA RATIO: 1.9 ECA: 71 cm/sec RIGHT CAROTID ARTERY: Mild atheromatous plaque at the bifurcation. RIGHT VERTEBRAL ARTERY: Patent with antegrade flow and normal waveform LEFT CAROTID ARTERY:  Mild atheromatous plaque at the bifurcation LEFT VERTEBRAL ARTERY: Patent with antegrade flow and normal waveform IMPRESSION: 1. Mild calcified plaque at the carotid bifurcations. No evidence of hemodynamically significant stenosis. 2. Antegrade flow in the vertebral arteries. Electronically Signed   By: Jorje Guild M.D.   On: 06/07/2021 06:44   DG Chest Portable 1 View  Result Date: 06/06/2021 CLINICAL DATA:  Shortness of breath. Evaluate for edema. Dizziness for 3 weeks. Left arm tingling and numbness started a. EXAM: PORTABLE CHEST 1 VIEW COMPARISON:  10/25/2019 FINDINGS: Cardiac enlargement. No vascular congestion, edema, or consolidation. Lungs are clear. Segmental elevation of the left hemidiaphragm similar to prior study. No pleural effusions. No pneumothorax. Calcified and tortuous aorta. IMPRESSION: Cardiac  enlargement.  No evidence of active pulmonary disease. Electronically Signed   By: Lucienne Capers M.D.   On: 06/06/2021 22:07      Domnick Chervenak [address] [place]  If 7PM-7AM, please contact night-coverage [email or web address] 06/07/2021, 4:06 PM

## 2021-06-08 ENCOUNTER — Other Ambulatory Visit: Payer: Self-pay

## 2021-06-08 DIAGNOSIS — N179 Acute kidney failure, unspecified: Secondary | ICD-10-CM | POA: Diagnosis not present

## 2021-06-08 DIAGNOSIS — R7989 Other specified abnormal findings of blood chemistry: Secondary | ICD-10-CM | POA: Diagnosis not present

## 2021-06-08 DIAGNOSIS — N1832 Chronic kidney disease, stage 3b: Secondary | ICD-10-CM | POA: Diagnosis not present

## 2021-06-08 DIAGNOSIS — I639 Cerebral infarction, unspecified: Secondary | ICD-10-CM | POA: Diagnosis not present

## 2021-06-08 LAB — CBC
HCT: 32.1 % — ABNORMAL LOW (ref 36.0–46.0)
Hemoglobin: 10.8 g/dL — ABNORMAL LOW (ref 12.0–15.0)
MCH: 30.6 pg (ref 26.0–34.0)
MCHC: 33.6 g/dL (ref 30.0–36.0)
MCV: 90.9 fL (ref 80.0–100.0)
Platelets: 255 10*3/uL (ref 150–400)
RBC: 3.53 MIL/uL — ABNORMAL LOW (ref 3.87–5.11)
RDW: 12.6 % (ref 11.5–15.5)
WBC: 7 10*3/uL (ref 4.0–10.5)
nRBC: 0 % (ref 0.0–0.2)

## 2021-06-08 LAB — RENAL FUNCTION PANEL
Albumin: 3.9 g/dL (ref 3.5–5.0)
Anion gap: 10 (ref 5–15)
BUN: 26 mg/dL — ABNORMAL HIGH (ref 8–23)
CO2: 23 mmol/L (ref 22–32)
Calcium: 9.6 mg/dL (ref 8.9–10.3)
Chloride: 98 mmol/L (ref 98–111)
Creatinine, Ser: 2.51 mg/dL — ABNORMAL HIGH (ref 0.44–1.00)
GFR, Estimated: 19 mL/min — ABNORMAL LOW (ref >60)
Glucose, Bld: 102 mg/dL — ABNORMAL HIGH (ref 70–99)
Phosphorus: 4.1 mg/dL (ref 2.5–4.6)
Potassium: 4.5 mmol/L (ref 3.5–5.1)
Sodium: 131 mmol/L — ABNORMAL LOW (ref 135–145)

## 2021-06-08 LAB — MAGNESIUM: Magnesium: 1.9 mg/dL (ref 1.7–2.4)

## 2021-06-08 MED ORDER — LABETALOL HCL 5 MG/ML IV SOLN: 10.0000 mg | INTRAVENOUS | Status: DC | PRN

## 2021-06-08 MED ORDER — LABETALOL HCL 5 MG/ML IV SOLN
10.0000 mg | INTRAVENOUS | Status: DC | PRN
  Administered 2021-06-08 – 2021-06-10 (×5): 10 mg via INTRAVENOUS
  Filled 2021-06-08 (×5): qty 4

## 2021-06-08 MED ORDER — IRBESARTAN 300 MG PO TABS: 300.0000 mg | ORAL_TABLET | Freq: Every day | ORAL | 1 refills | Status: DC

## 2021-06-08 MED ORDER — CLONIDINE HCL 0.1 MG PO TABS
0.1000 mg | ORAL_TABLET | Freq: Three times a day (TID) | ORAL | Status: DC
  Administered 2021-06-08 (×3): 0.1 mg via ORAL
  Filled 2021-06-08 (×3): qty 1

## 2021-06-08 MED ORDER — CARVEDILOL 25 MG PO TABS
25.0000 mg | ORAL_TABLET | Freq: Two times a day (BID) | ORAL | Status: DC
  Administered 2021-06-08 – 2021-06-10 (×5): 25 mg via ORAL
  Filled 2021-06-08 (×5): qty 1

## 2021-06-08 MED ORDER — CLONIDINE HCL 0.1 MG PO TABS: 0.1000 mg | ORAL_TABLET | Freq: Three times a day (TID) | ORAL | 0 refills | Status: DC

## 2021-06-08 NOTE — Progress Notes (Signed)
PROGRESS NOTE  Betty Nielsen [id number] DOB: 30-Apr-1946   PCP: Steele Sizer, MD  Patient is from: Home.  Lives alone.  Independently ambulates at baseline.  DOA: 06/06/2021 LOS: 0  Chief complaints:  Chief Complaint  Patient presents with   Dizziness   Numbness     Brief Narrative / Interim history: 75 year old F with PMH of right lacunar CVA in 10/2019, uncontrolled HTN, CKD-3B, hyponatremia, HLD and anemia of chronic disease presenting with left upper extremity and left facial numbness since 06/05/21 at 9 PM.  Patient was outside tPA window on arrival to ED the next day.  CVA work-up including CT head, MRI brain, MRA head, carotid US and TTE without acute significant finding.  BP as high as 226/115 while in ED.  Subjective: Seen and examined earlier this morning.  She was ambulating in the hallway without problem.  She has no complaints.  She denies chest pain, dyspnea, GI or UTI symptoms.  She denies dizziness or numbness.  Objective: Vitals:   06/08/21 1648 06/08/21 1700 06/08/21 1730 06/08/21 1800  BP: (!) 197/101 (!) 194/100 (!) 203/103 (!) 200/103  Pulse: 68 71 69 70  Resp: 18  16 (!) 29  Temp:      TempSrc:      SpO2: 95% 97% 97% 100%  Weight:      Height:       No intake or output data in the 24 hours ending 06/08/21 1858 Filed Weights   06/06/21 2108  Weight: 84.8 kg    Examination: GENERAL: No apparent distress.  Nontoxic. HEENT: MMM.  Vision and hearing grossly intact.  NECK: Supple.  No apparent JVD.  RESP: 100% on RA.  No IWOB.  Fair aeration bilaterally. CVS:  RRR. Heart sounds normal.  ABD/GI/GU: BS+. Abd soft, NTND.  MSK/EXT:  Moves extremities. No apparent deformity. No edema.  SKIN: no apparent skin lesion or wound NEURO: Awake, alert and oriented appropriately. Speech clear. Cranial nerves II-XII intact. Motor 5/5 in all muscle groups of UE and LE bilaterally, Normal tone. Light sensation intact in all dermatomes of upper and lower ext  bilaterally. Patellar reflex symmetric.  No pronator drift.  Finger to nose intact. PSYCH: Calm. Normal affect.   Procedures:  None  Microbiology summarized: [id number] and influenza PCR nonreactive.  Assessment & Plan: LUE/left face paresthesia-due to uncontrolled hypertension?  Neuro exam including sensory exam intact.  CT head, MRI brain, MRA head, TTE and bilateral carotid US without significant finding. LDL 128.  A1c 5.7%.  Symptoms resolved.  BP improved early in the morning but has gone up later this afternoon.  Noted that he has not been getting hydralazine as needed.  -Continue home Plavix and atorvastatin -Manage hypertension as below  Uncontrolled hypertension-BP as high as 226/115.  Remains elevated after interval improvement. -Change clonidine from 0.1 mg twice daily to 3 times daily -Increase Coreg from 12.5 mg to 25 mg twice daily -Hold Lasix.  Creatinine up.  She has already received a dose this morning. -Continue home Avapro -P.o. hydralazine 25 mg every 6 hours as needed-encouraged to take this. -Patient needs outpatient sleep study.  She reports snoring that wakes her up from sleep. -Consider changing once daily antihypertensive meds to bedtime given her dizziness  AKI on CKD-3B/azotemia: Stable. Recent Labs    06/18/20 1357 06/06/21 2110 06/07/21 0740 06/08/21 0707  BUN 18 26* 24* 26*  CREATININE 1.82* 1.98* 1.87* 2.51*  -Hold p.o. Lasix.  She has already received morning dose -Recheck  in the morning  History of right right basal ganglia CVA/hyperlipidemia -Continue Lipitor and Plavix as above -Optimize blood pressure as above  Unsteady gait/dizziness-her neuro exam is reassuring.  Resolved.  Ambulating independently -Adjust antihypertensive meds as above -Fall precaution -PT/OT eval  At risk for sleep apnea She reports snoring that wakes her up from sleep. -Needs outpatient sleep study.  Hypothyroidism -Continue home Synthroid.  GERD: Reports  taking Prevacid at home.  Allergy to Protonix? -P.o. Pepcid 20 mg daily -GI cocktail as needed  Body mass index is 31.12 kg/m.         DVT prophylaxis:  heparin injection 5,000 Units Start: 06/07/21 1630 SCDs Start: 06/06/21 2340 Start subcu heparin  Code Status: Full code Family Communication: Updated patient's son at bedside on 9/18.  None at bedside today.. Level of care: Progressive Cardiac Status is: Observation  The patient will require care spanning > 2 midnights and should be moved to inpatient because: Hemodynamically unstable, Ongoing diagnostic testing needed not appropriate for outpatient work up, and Inpatient level of care appropriate due to severity of illness  Dispo: The patient is from: Home              Anticipated d/c is to: Home              Patient currently is not medically stable to d/c.   Difficult to place patient No       Consultants:  None   Sch Meds:  Scheduled Meds:  atorvastatin  40 mg Oral q1800   carvedilol  25 mg Oral BID WC   cloNIDine  0.1 mg Oral TID   clopidogrel  75 mg Oral Daily   famotidine  20 mg Oral Daily   heparin  5,000 Units Subcutaneous Q8H   irbesartan  300 mg Oral Daily   levothyroxine  75 mcg Oral Q0600   Continuous Infusions: PRN Meds:.acetaminophen **OR** acetaminophen, alum & mag hydroxide-simeth, hydrALAZINE, ondansetron (ZOFRAN) IV  Antimicrobials: Anti-infectives (From admission, onward)    None        I have personally reviewed the following labs and images: CBC: Recent Labs  Lab 06/06/21 2110 06/07/21 0740 06/08/21 0707  WBC 5.8 7.8 7.0  NEUTROABS 2.5  --   --   HGB 10.0* 10.9* 10.8*  HCT 29.9* 32.1* 32.1*  MCV 94.0 91.7 90.9  PLT 213 233 255   BMP &GFR Recent Labs  Lab 06/06/21 2110 06/06/21 2345 06/07/21 0740 06/08/21 0707  NA 131*  --  132* 131*  K 4.4  --  4.7 4.5  CL 98  --  99 98  CO2 25  --  23 23  GLUCOSE 119*  --  109* 102*  BUN 26*  --  24* 26*  CREATININE 1.98*   --  1.87* 2.51*  CALCIUM 9.0  --  9.6 9.6  MG  --  2.0 2.0 1.9  PHOS  --   --   --  4.1   Estimated Creatinine Clearance: 20.8 mL/min (A) (by C-G formula based on SCr of 2.51 mg/dL (H)). Liver & Pancreas: Recent Labs  Lab 06/06/21 2110 06/07/21 0740 06/08/21 0707  AST 20 23  --   ALT 12 12  --   ALKPHOS 89 84  --   BILITOT 0.6 0.7  --   PROT 6.6 6.9  --   ALBUMIN 3.7 3.8 3.9   No results for input(s): LIPASE, AMYLASE in the last 168 hours. No results for input(s): AMMONIA in the last  168 hours. Diabetic: Recent Labs    06/07/21 0740  HGBA1C 5.7*   Recent Labs  Lab 06/06/21 2110  GLUCAP 114*   Cardiac Enzymes: No results for input(s): CKTOTAL, CKMB, CKMBINDEX, TROPONINI in the last 168 hours. No results for input(s): PROBNP in the last 8760 hours. Coagulation Profile: Recent Labs  Lab 06/06/21 2110  INR 1.0   Thyroid Function Tests: No results for input(s): TSH, T4TOTAL, FREET4, T3FREE, THYROIDAB in the last 72 hours. Lipid Profile: Recent Labs    06/07/21 0740  CHOL 214*  HDL 70  LDLCALC 128*  TRIG 82  CHOLHDL 3.1   Anemia Panel: No results for input(s): VITAMINB12, FOLATE, FERRITIN, TIBC, IRON, RETICCTPCT in the last 72 hours. Urine analysis:    Component Value Date/Time   COLORURINE YELLOW 06/07/2021 0912   APPEARANCEUR CLEAR 06/07/2021 0912   APPEARANCEUR Clear 04/04/2012 1438   LABSPEC 1.020 06/07/2021 0912   LABSPEC 1.004 04/04/2012 1438   PHURINE 7.0 06/07/2021 0912   GLUCOSEU NEGATIVE 06/07/2021 0912   GLUCOSEU Negative 04/04/2012 1438   HGBUR MODERATE (A) 06/07/2021 0912   BILIRUBINUR NEGATIVE 06/07/2021 0912   BILIRUBINUR Negative 04/04/2012 1438   KETONESUR NEGATIVE 06/07/2021 0912   PROTEINUR 100 (A) 06/07/2021 0912   UROBILINOGEN 0.2 04/15/2008 0946   NITRITE NEGATIVE 06/07/2021 0912   LEUKOCYTESUR NEGATIVE 06/07/2021 0912   LEUKOCYTESUR Trace 04/04/2012 1438   Sepsis Labs: Invalid input(s): PROCALCITONIN,  [place]  Microbiology: Recent Results (from the past 240 hour(s))  Resp Panel by RT-PCR (Flu A&B, Covid) Nasopharyngeal Swab     Status: None   Collection Time: 06/06/21  9:57 PM   Specimen: Nasopharyngeal Swab; Nasopharyngeal(NP) swabs in vial transport medium  Result Value Ref Range Status   SARS Coronavirus 2 by RT PCR NEGATIVE NEGATIVE Final    Comment: (NOTE) SARS-CoV-2 target nucleic acids are NOT DETECTED.  The SARS-CoV-2 RNA is generally detectable in upper respiratory specimens during the acute phase of infection. The lowest concentration of SARS-CoV-2 viral copies this assay can detect is 138 copies/mL. A negative result does not preclude SARS-Cov-2 infection and should not be used as the sole basis for treatment or other patient management decisions. A negative result may occur with  improper specimen collection/handling, submission of specimen other than nasopharyngeal swab, presence of viral mutation(s) within the areas targeted by this assay, and inadequate number of viral copies(<138 copies/mL). A negative result must be combined with clinical observations, patient history, and epidemiological information. The expected result is Negative.  Fact Sheet for Patients:  EntrepreneurPulse.com.au  Fact Sheet for Healthcare Providers:  IncredibleEmployment.be  This test is no t yet approved or cleared by the [place] FDA and  has been authorized for detection and/or diagnosis of SARS-CoV-2 by FDA under an Emergency Use Authorization (EUA). This EUA will remain  in effect (meaning this test can be used) for the duration of the COVID-19 declaration under Section 564(b)(1) of the Act, 21 U.S.C.section 360bbb-3(b)(1), unless the authorization is terminated  or revoked sooner.       Influenza A by PCR NEGATIVE NEGATIVE Final   Influenza B by PCR NEGATIVE NEGATIVE Final    Comment: (NOTE) The Xpert Xpress SARS-CoV-2/FLU/RSV plus  assay is intended as an aid in the diagnosis of influenza from Nasopharyngeal swab specimens and should not be used as a sole basis for treatment. Nasal washings and aspirates are unacceptable for Xpert Xpress SARS-CoV-2/FLU/RSV testing.  Fact Sheet for Patients: EntrepreneurPulse.com.au  Fact Sheet for Healthcare Providers: IncredibleEmployment.be  This test  is not yet approved or cleared by the [place] and has been authorized for detection and/or diagnosis of SARS-CoV-2 by FDA under an Emergency Use Authorization (EUA). This EUA will remain in effect (meaning this test can be used) for the duration of the COVID-19 declaration under Section 564(b)(1) of the Act, 21 U.S.C. section 360bbb-3(b)(1), unless the authorization is terminated or revoked.  Performed at [facility], [address] [place], [place] [address]     Radiology Studies: No results found.    Adaira Centola T. [place]  If 7PM-7AM, please contact night-coverage [email or web address] 06/08/2021, 6:58 PM

## 2021-06-08 NOTE — ED Notes (Signed)
Pt given self hygiene

## 2021-06-08 NOTE — ED Notes (Signed)
Patient's son Alfredo Batty called and would like patient to call him back at:  [phone]

## 2021-06-08 NOTE — TOC Initial Note (Signed)
Transition of Care [facility]) - Initial/Assessment Note    Patient Details  Name: Betty Nielsen MRN: [id number] Date of Birth: December 11, 1945  Transition of [facility]) CM/SW Contact:    Anselm Pancoast, RN Phone Number: 06/08/2021, 4:07 PM  Clinical Narrative:                 Met with patient at bedside and discussed home situation. Patient lives in her own home and has strong family support. Independent with ADL's and drives herself to appointments and grocery store. Children check in on her daily. Patient reports she has no need for [place] at this time but can discuss further with her PCP at her follow up appointment. Able to get medications as needed. No Needs or concerns. Reports she drove herself to the hospital and her car remains in the parking lot. Will call her daughter to assist with getting herself and her car home.   Expected Discharge Plan: Home/Self Care Barriers to Discharge: No Barriers Identified   Patient Goals and CMS Choice Patient states their goals for this hospitalization and ongoing recovery are:: Get back home      Expected Discharge Plan and Services Expected Discharge Plan: Home/Self Care       Living arrangements for the past 2 months: Single Family Home Expected Discharge Date: 06/08/21                                    Prior Living Arrangements/Services Living arrangements for the past 2 months: Single Family Home Lives with:: Self Patient language and need for interpreter reviewed:: Yes Do you feel safe going back to the place where you live?: Yes      Need for Family Participation in Patient Care: Yes (Comment) Care giver support system in place?: Yes (comment)   Criminal Activity/Legal Involvement Pertinent to Current Situation/Hospitalization: No - Comment as needed  Activities of Daily Living      Permission Sought/Granted                  Emotional Assessment Appearance:: Appears younger than stated age,  Well-Groomed Attitude/Demeanor/Rapport: Engaged Affect (typically observed): Accepting Orientation: : Oriented to Place, Oriented to Self, Oriented to  Time, Oriented to Situation Alcohol / Substance Use: Not Applicable Psych Involvement: No (comment)  Admission diagnosis:  CVA (cerebral vascular accident) [facility]) [I63.9] Patient Active Problem List   Diagnosis Date Noted   Left upper extremity numbness 06/07/2021   Left facial numbness 06/07/2021   Elevated serum creatinine 06/07/2021   CVA (cerebral vascular accident) ([place]) 06/06/2021   Benign hypertension with chronic kidney disease, stage III ([place]) 06/18/2020   Anemia in chronic kidney disease 11/21/2019   Stage 3b chronic kidney disease ([place]) 11/21/2019   Hypo-osmolality and hyponatremia 11/21/2019   Secondary hyperparathyroidism of renal origin ([place]) 11/21/2019   History of stroke 10/27/2019   PMR (polymyalgia rheumatica) ([place]) 06/15/2017   Headache 10/27/2016   CKD (chronic kidney disease), stage III ([place]) 10/27/2016   Hyperlipidemia 10/27/2016   Chronic hyponatremia 10/27/2016   Angina pectoris ([place]) 10/25/2016   History of non-ST elevation myocardial infarction (NSTEMI) 10/25/2016   History of coronary angioplasty 10/25/2016   BPV (benign positional vertigo) 06/15/2016   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[place]) 11/21/2015   Acid reflux 06/05/2015   Hypertension, benign 06/05/2015   Hypothyroidism, 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   Major depression in remission ([place]) 06/05/2015   Obesity (BMI 30.0-34.9) 06/05/2015  Neuropathy 06/05/2015   Perennial allergic rhinitis with seasonal variation 06/05/2015   Arthritis, degenerative 07/03/2014   AAA (abdominal aortic aneurysm) without rupture (HCC) 06/26/2014   PCP:  Steele Sizer, MD Pharmacy:   [facility] [address]), [place]) [place] [address] Phone: [phone] Fax: [phone]     Social  Determinants of Health (SDOH) Interventions    Readmission Risk Interventions No flowsheet data found.

## 2021-06-08 NOTE — ED Notes (Signed)
Pt up walking around room. NADN and pt report no pain

## 2021-06-08 NOTE — TOC Transition Note (Addendum)
Transition of Care [facility]) - CM/SW Discharge Note   Patient Details  Name: Betty Nielsen MRN: [id number] Date of Birth: Jul 11, 1946  [facility]) CM/SW Contact:  Anselm Pancoast, RN Phone Number: 06/08/2021, 4:09 PM   Clinical Narrative:    Refused HHC and no other needs.  Updated MD and ED RN of no other needs from [facility].  Patient reports she has spoken to her son and is eager to return home.       Barriers to Discharge: No Barriers Identified   Patient Goals and CMS Choice Patient states their goals for this hospitalization and ongoing recovery are:: Get back home      Discharge Placement                       Discharge Plan and Services                                     Social Determinants of Health (SDOH) Interventions     Readmission Risk Interventions No flowsheet data found.

## 2021-06-08 NOTE — ED Notes (Signed)
Hospitalist at bedside 

## 2021-06-08 NOTE — ED Notes (Signed)
Contacted [place] (TOC) to let her know of impending d/c

## 2021-06-09 ENCOUNTER — Inpatient Hospital Stay: Payer: Medicare Other

## 2021-06-09 DIAGNOSIS — I252 Old myocardial infarction: Secondary | ICD-10-CM | POA: Diagnosis not present

## 2021-06-09 DIAGNOSIS — Z6832 Body mass index (BMI) 32.0-32.9, adult: Secondary | ICD-10-CM | POA: Diagnosis not present

## 2021-06-09 DIAGNOSIS — I2510 Atherosclerotic heart disease of native coronary artery without angina pectoris: Secondary | ICD-10-CM | POA: Diagnosis present

## 2021-06-09 DIAGNOSIS — N280 Ischemia and infarction of kidney: Secondary | ICD-10-CM | POA: Diagnosis present

## 2021-06-09 DIAGNOSIS — I10 Essential (primary) hypertension: Secondary | ICD-10-CM | POA: Diagnosis not present

## 2021-06-09 DIAGNOSIS — M353 Polymyalgia rheumatica: Secondary | ICD-10-CM | POA: Diagnosis present

## 2021-06-09 DIAGNOSIS — I701 Atherosclerosis of renal artery: Secondary | ICD-10-CM | POA: Diagnosis not present

## 2021-06-09 DIAGNOSIS — Z79899 Other long term (current) drug therapy: Secondary | ICD-10-CM | POA: Diagnosis not present

## 2021-06-09 DIAGNOSIS — E785 Hyperlipidemia, unspecified: Secondary | ICD-10-CM | POA: Diagnosis present

## 2021-06-09 DIAGNOSIS — I150 Renovascular hypertension: Secondary | ICD-10-CM | POA: Diagnosis present

## 2021-06-09 DIAGNOSIS — T508X5A Adverse effect of diagnostic agents, initial encounter: Secondary | ICD-10-CM | POA: Diagnosis not present

## 2021-06-09 DIAGNOSIS — D631 Anemia in chronic kidney disease: Secondary | ICD-10-CM | POA: Diagnosis present

## 2021-06-09 DIAGNOSIS — R55 Syncope and collapse: Secondary | ICD-10-CM | POA: Diagnosis not present

## 2021-06-09 DIAGNOSIS — E890 Postprocedural hypothyroidism: Secondary | ICD-10-CM | POA: Diagnosis present

## 2021-06-09 DIAGNOSIS — E8881 Metabolic syndrome: Secondary | ICD-10-CM | POA: Diagnosis present

## 2021-06-09 DIAGNOSIS — N170 Acute kidney failure with tubular necrosis: Secondary | ICD-10-CM | POA: Diagnosis present

## 2021-06-09 DIAGNOSIS — I739 Peripheral vascular disease, unspecified: Secondary | ICD-10-CM | POA: Diagnosis present

## 2021-06-09 DIAGNOSIS — Z7902 Long term (current) use of antithrombotics/antiplatelets: Secondary | ICD-10-CM | POA: Diagnosis not present

## 2021-06-09 DIAGNOSIS — I716 Thoracoabdominal aortic aneurysm, without rupture: Secondary | ICD-10-CM | POA: Diagnosis present

## 2021-06-09 DIAGNOSIS — E875 Hyperkalemia: Secondary | ICD-10-CM | POA: Diagnosis not present

## 2021-06-09 DIAGNOSIS — I639 Cerebral infarction, unspecified: Secondary | ICD-10-CM | POA: Diagnosis not present

## 2021-06-09 DIAGNOSIS — R0602 Shortness of breath: Secondary | ICD-10-CM | POA: Diagnosis present

## 2021-06-09 DIAGNOSIS — N2889 Other specified disorders of kidney and ureter: Secondary | ICD-10-CM | POA: Diagnosis not present

## 2021-06-09 DIAGNOSIS — N1832 Chronic kidney disease, stage 3b: Secondary | ICD-10-CM | POA: Diagnosis present

## 2021-06-09 DIAGNOSIS — N179 Acute kidney failure, unspecified: Secondary | ICD-10-CM | POA: Diagnosis not present

## 2021-06-09 DIAGNOSIS — K219 Gastro-esophageal reflux disease without esophagitis: Secondary | ICD-10-CM | POA: Diagnosis present

## 2021-06-09 DIAGNOSIS — I151 Hypertension secondary to other renal disorders: Secondary | ICD-10-CM | POA: Diagnosis not present

## 2021-06-09 DIAGNOSIS — R200 Anesthesia of skin: Secondary | ICD-10-CM | POA: Diagnosis not present

## 2021-06-09 DIAGNOSIS — H9190 Unspecified hearing loss, unspecified ear: Secondary | ICD-10-CM | POA: Diagnosis present

## 2021-06-09 DIAGNOSIS — E871 Hypo-osmolality and hyponatremia: Secondary | ICD-10-CM | POA: Diagnosis present

## 2021-06-09 DIAGNOSIS — I700 Atherosclerosis of aorta: Secondary | ICD-10-CM | POA: Diagnosis present

## 2021-06-09 DIAGNOSIS — E669 Obesity, unspecified: Secondary | ICD-10-CM | POA: Diagnosis present

## 2021-06-09 DIAGNOSIS — Z20822 Contact with and (suspected) exposure to covid-19: Secondary | ICD-10-CM | POA: Diagnosis present

## 2021-06-09 LAB — CBC
HCT: 30.5 % — ABNORMAL LOW (ref 36.0–46.0)
Hemoglobin: 10.4 g/dL — ABNORMAL LOW (ref 12.0–15.0)
MCH: 31.7 pg (ref 26.0–34.0)
MCHC: 34.1 g/dL (ref 30.0–36.0)
MCV: 93 fL (ref 80.0–100.0)
Platelets: 242 10*3/uL (ref 150–400)
RBC: 3.28 MIL/uL — ABNORMAL LOW (ref 3.87–5.11)
RDW: 12.3 % (ref 11.5–15.5)
WBC: 6.7 10*3/uL (ref 4.0–10.5)
nRBC: 0 % (ref 0.0–0.2)

## 2021-06-09 LAB — RENAL FUNCTION PANEL
Albumin: 3.4 g/dL — ABNORMAL LOW (ref 3.5–5.0)
Anion gap: 10 (ref 5–15)
BUN: 30 mg/dL — ABNORMAL HIGH (ref 8–23)
CO2: 24 mmol/L (ref 22–32)
Calcium: 9.2 mg/dL (ref 8.9–10.3)
Chloride: 99 mmol/L (ref 98–111)
Creatinine, Ser: 2.55 mg/dL — ABNORMAL HIGH (ref 0.44–1.00)
GFR, Estimated: 19 mL/min — ABNORMAL LOW (ref >60)
Glucose, Bld: 92 mg/dL (ref 70–99)
Phosphorus: 4.4 mg/dL (ref 2.5–4.6)
Potassium: 4.2 mmol/L (ref 3.5–5.1)
Sodium: 133 mmol/L — ABNORMAL LOW (ref 135–145)

## 2021-06-09 LAB — TROPONIN I (HIGH SENSITIVITY)
Troponin I (High Sensitivity): 21 ng/L — ABNORMAL HIGH (ref <18)
Troponin I (High Sensitivity): 22 ng/L — ABNORMAL HIGH (ref <18)

## 2021-06-09 LAB — GLUCOSE, CAPILLARY: Glucose-Capillary: 133 mg/dL — ABNORMAL HIGH (ref 70–99)

## 2021-06-09 LAB — MAGNESIUM: Magnesium: 2.1 mg/dL (ref 1.7–2.4)

## 2021-06-09 IMAGING — CT CT HEAD W/O CM
3 of 4 series · 14 of 47 positions shown, 16 images · non-contrast
Comparison: [DATE]

CLINICAL DATA: Mental status change, unknown cause

EXAM:
CT HEAD WITHOUT CONTRAST
TECHNIQUE: Contiguous axial images were obtained from the base of the skull
through the vertex without intravenous contrast.

[Series 4: head wo · axial · 0.36mm/px · z∈[+443,+595]mm · 8 of 37 slices shown, 10 images]
[im 3/37  brain]
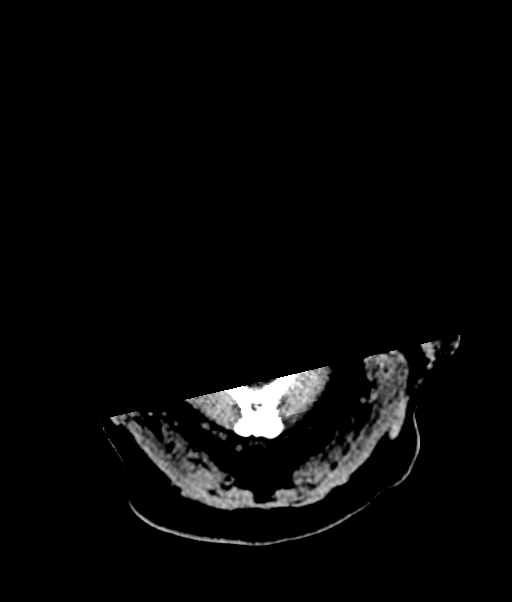
[im 3/37  bone]
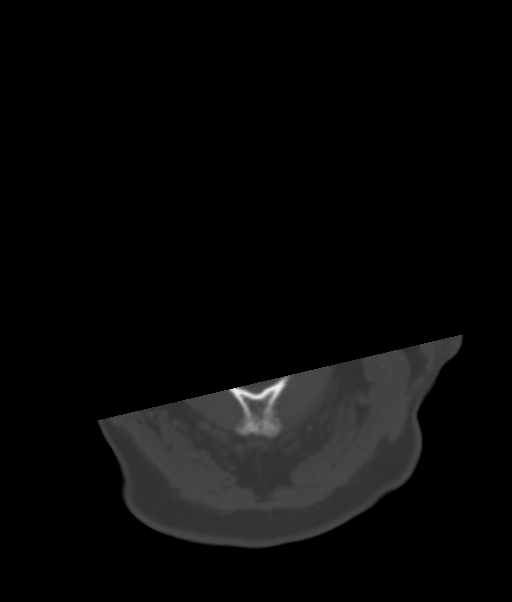
[im 8/37  brain]
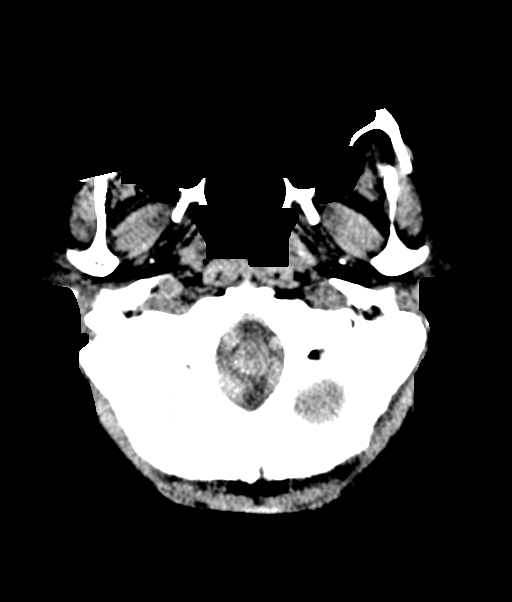
[im 13/37  brain]
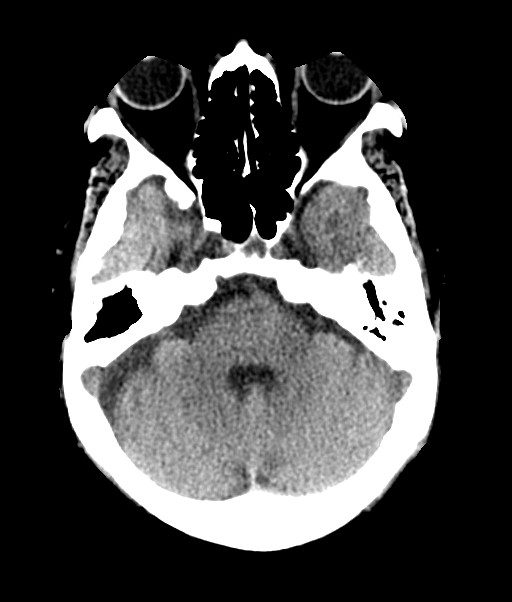
[im 17/37  brain]
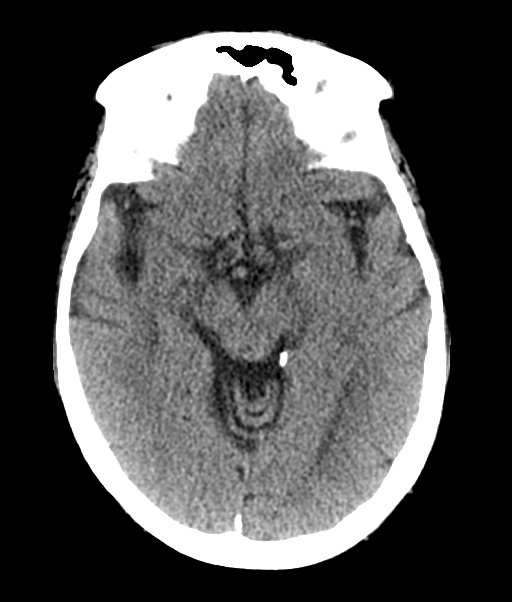
[im 20/37  brain]
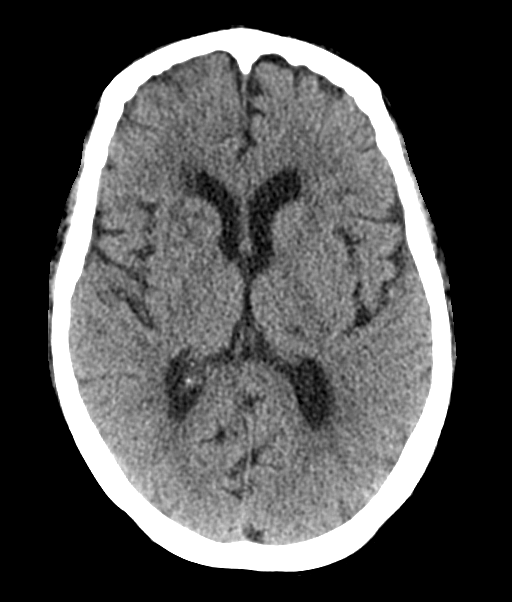
[im 20/37  bone]
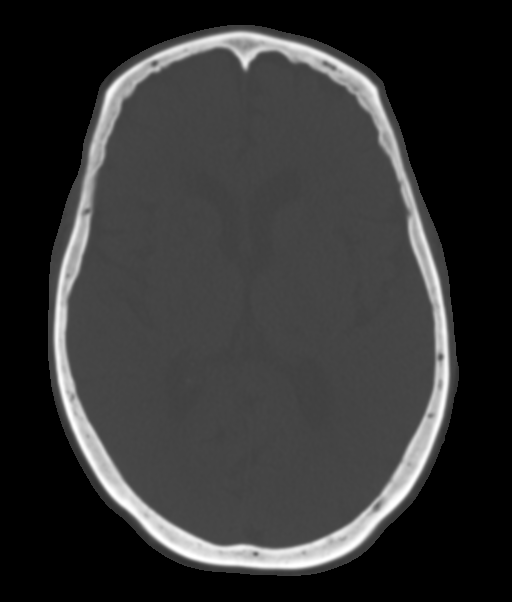
[im 25/37  brain]
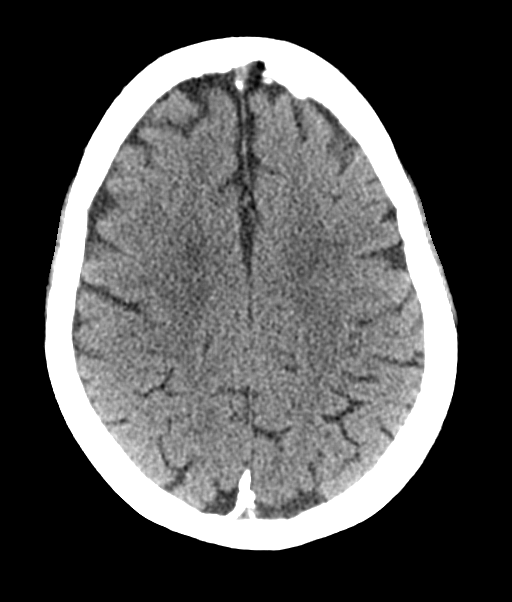
[im 29/37  brain]
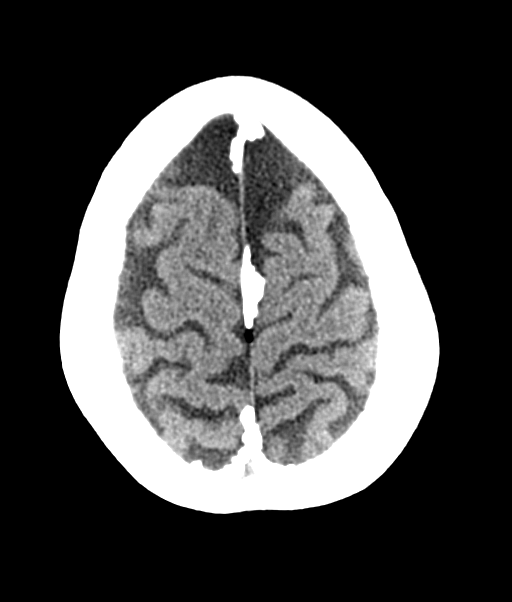
[im 34/37  brain]
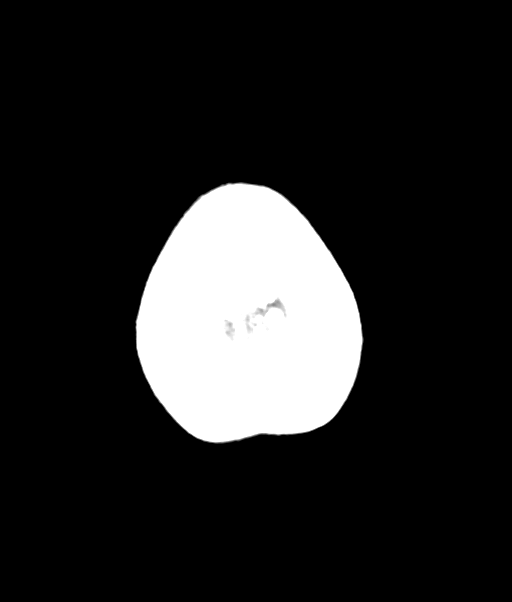

[Series 6: coronal soft tissue · coronal · 0.35mm/px · 3 of 71 slices shown]
[im 24/71  brain]
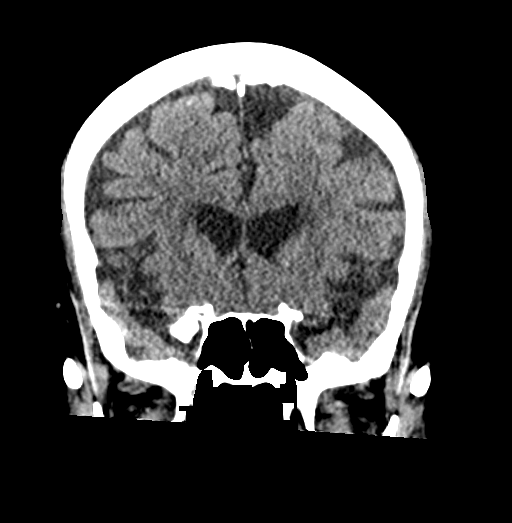
[im 32/71  brain]
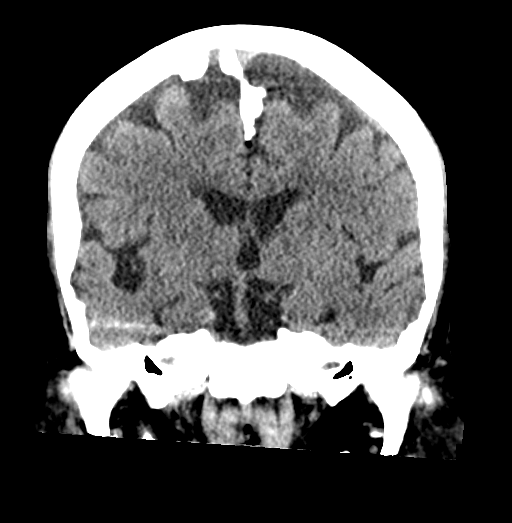
[im 39/71  brain]
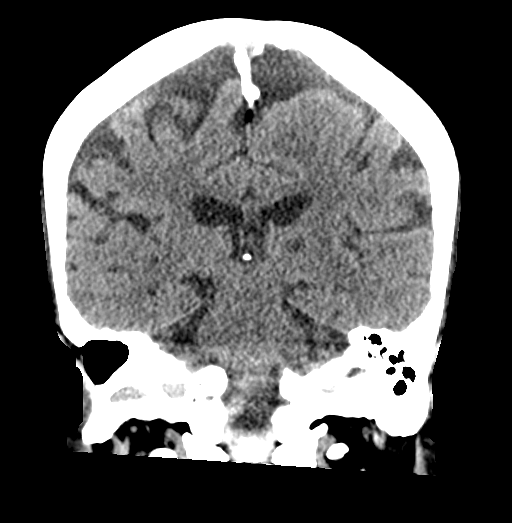

[Series 7: sagittal soft tissue · sagittal · 0.37mm/px · 3 of 59 slices shown]
[im 20/59  brain]
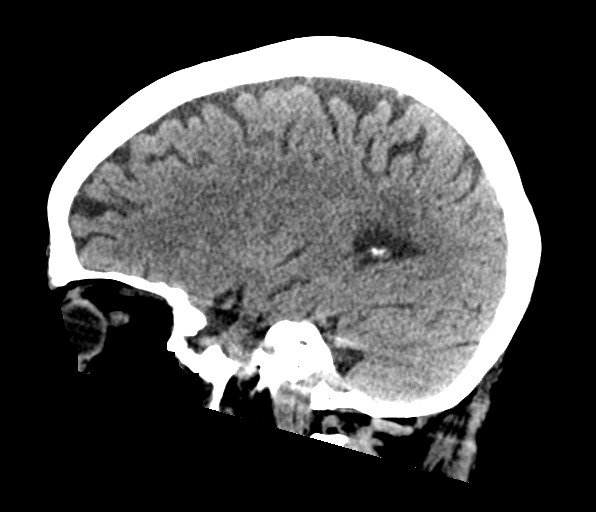
[im 30/59  brain]
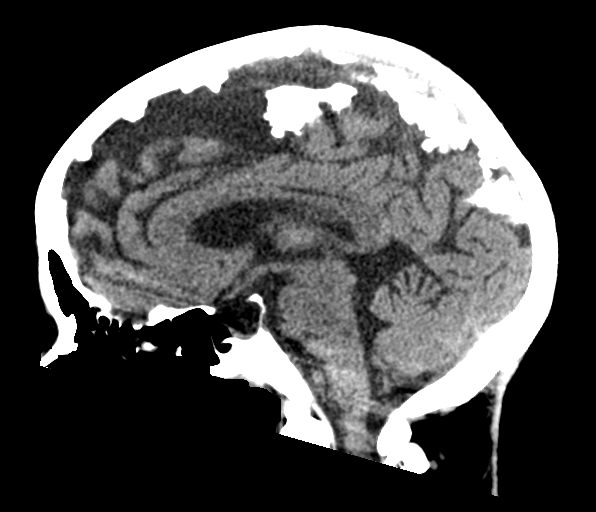
[im 39/59  brain]
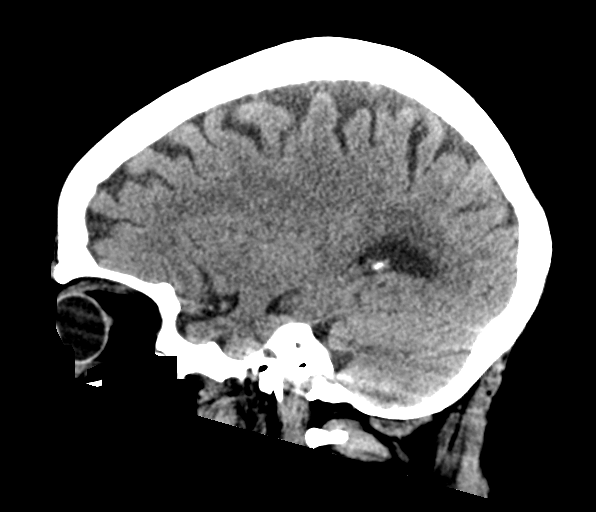

[14 of 47 positions shown; findings below may reference images not displayed]

FINDINGS: Brain: No evidence of acute infarction, hemorrhage, hydrocephalus,
extra-axial collection or mass lesion/mass effect. Scattered
low-density changes within the periventricular and subcortical white
matter compatible with chronic microvascular ischemic change. Mild
diffuse cerebral volume loss.

Vascular: Atherosclerotic calcifications involving the large vessels
of the skull base. No unexpected hyperdense vessel.

Skull: Normal. Negative for fracture or focal lesion.

Sinuses/Orbits: No acute finding.

Other: None.
IMPRESSION: No acute intracranial findings.  Stable exam.

## 2021-06-09 IMAGING — DX DG CHEST 1V PORT
1 series · 1 of 1 positions shown · non-contrast
Comparison: [DATE]

CLINICAL DATA: Syncope

EXAM:
PORTABLE CHEST 1 VIEW

[chest ap]
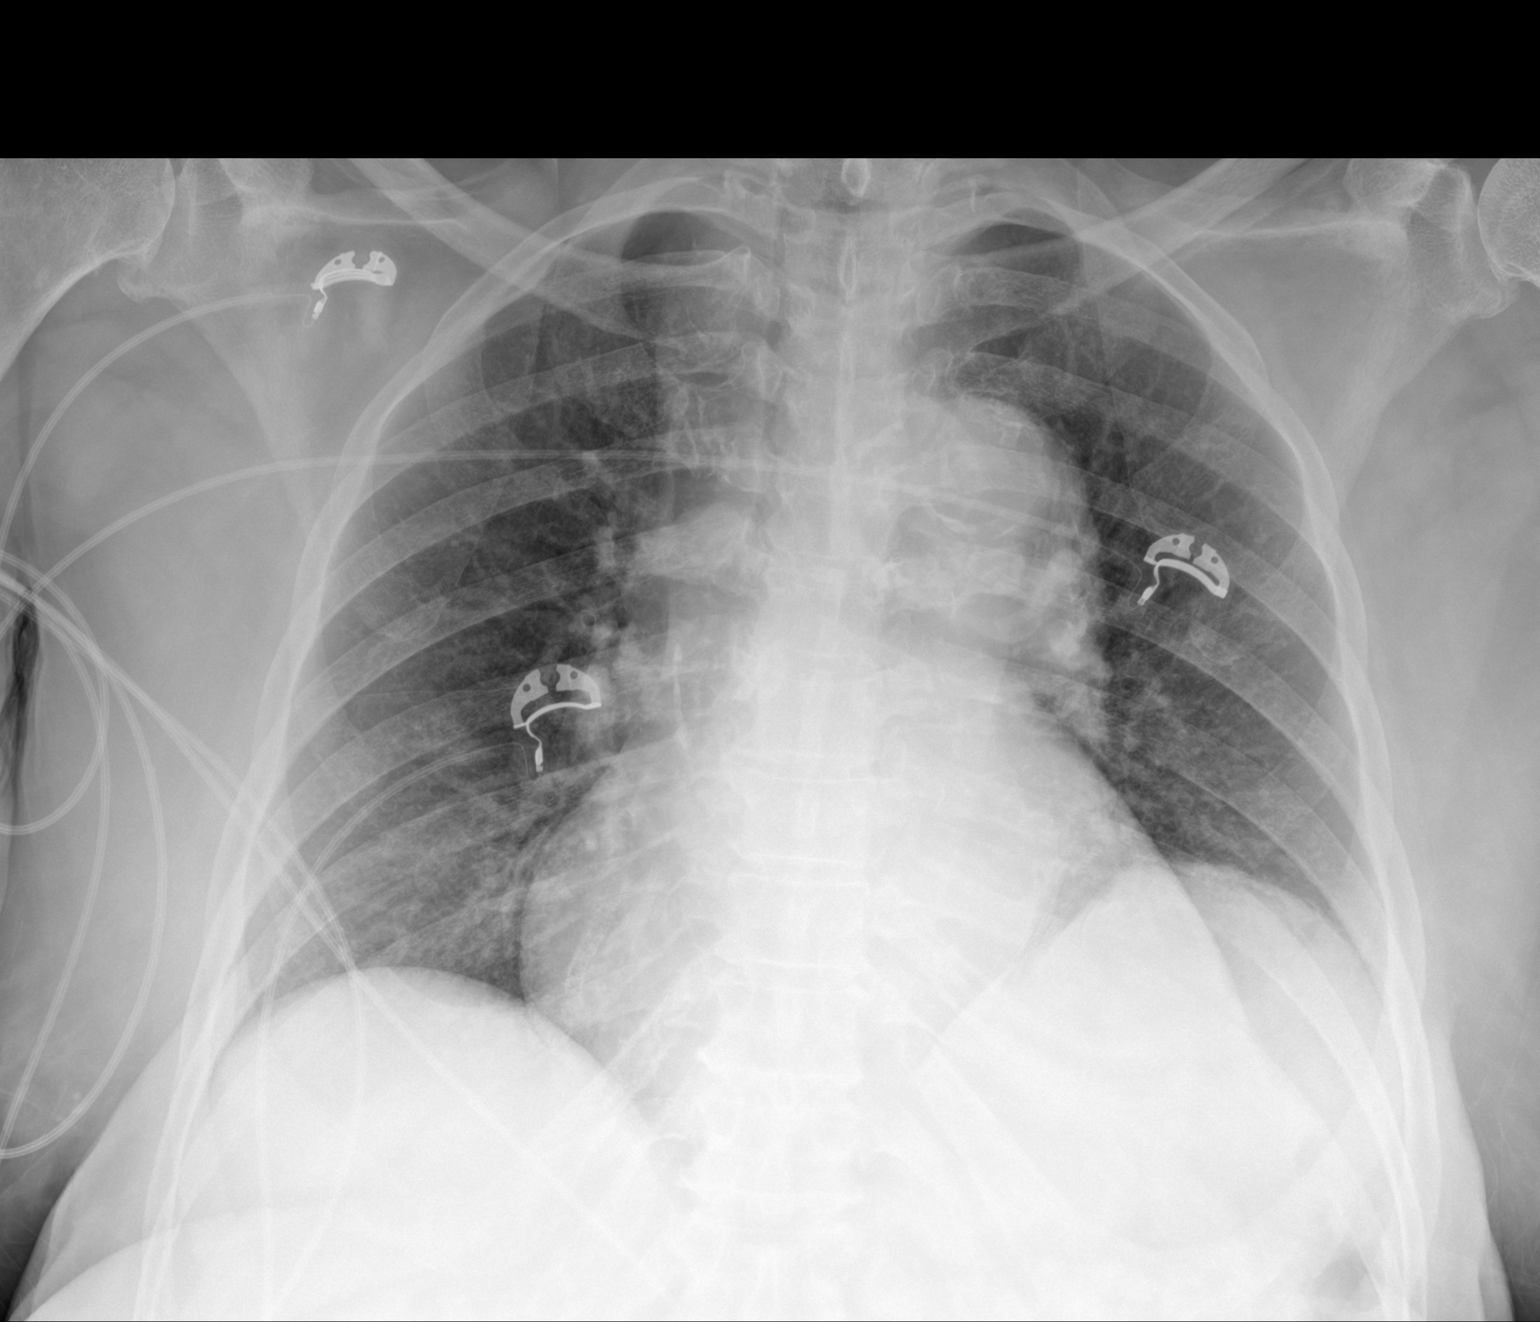

[1 of 1 positions shown; findings below may reference images not displayed]

FINDINGS: Chronic cardiomegaly. Chronic aortic atherosclerosis with
tortuosity. Chronic elevation of the left hemidiaphragm. No
pulmonary edema. No infiltrate or active collapse. No effusion.
IMPRESSION: Chronic cardiomegaly and aortic atherosclerotic disease. Chronic
elevation of the left hemidiaphragm. No change since the previous
study.

## 2021-06-09 MED ORDER — CLONIDINE HCL 0.1 MG PO TABS
0.1000 mg | ORAL_TABLET | Freq: Once | ORAL | Status: AC
  Administered 2021-06-09: 0.1 mg via ORAL
  Filled 2021-06-09: qty 1

## 2021-06-09 MED ORDER — HYDRALAZINE HCL 20 MG/ML IJ SOLN
10.0000 mg | Freq: Once | INTRAMUSCULAR | Status: DC
  Filled 2021-06-09 (×2): qty 1

## 2021-06-09 MED ORDER — CLONIDINE HCL 0.1 MG PO TABS: 0.2000 mg | ORAL_TABLET | Freq: Two times a day (BID) | ORAL | Status: DC

## 2021-06-09 MED ORDER — SODIUM CHLORIDE 0.9 % IV BOLUS
1000.0000 mL | Freq: Once | INTRAVENOUS | Status: AC
  Administered 2021-06-09: 1000 mL via INTRAVENOUS

## 2021-06-09 MED ORDER — HYDROCHLOROTHIAZIDE 12.5 MG PO CAPS
12.5000 mg | ORAL_CAPSULE | Freq: Every day | ORAL | Status: DC
  Administered 2021-06-09 – 2021-06-13 (×5): 12.5 mg via ORAL
  Filled 2021-06-09 (×5): qty 1

## 2021-06-09 MED ORDER — CLONIDINE HCL 0.1 MG PO TABS
0.1000 mg | ORAL_TABLET | Freq: Three times a day (TID) | ORAL | Status: DC
  Administered 2021-06-09 – 2021-06-10 (×3): 0.1 mg via ORAL
  Filled 2021-06-09 (×3): qty 1

## 2021-06-09 MED ORDER — CLONIDINE HCL 0.1 MG PO TABS
0.2000 mg | ORAL_TABLET | Freq: Three times a day (TID) | ORAL | Status: DC
  Administered 2021-06-09: 0.2 mg via ORAL
  Filled 2021-06-09: qty 2

## 2021-06-09 MED ORDER — HYDROCHLOROTHIAZIDE 12.5 MG PO CAPS: 12.5000 mg | ORAL_CAPSULE | Freq: Every day | ORAL | 0 refills | Status: DC

## 2021-06-09 MED ORDER — FAMOTIDINE 20 MG PO TABS
10.0000 mg | ORAL_TABLET | Freq: Every day | ORAL | Status: DC
  Administered 2021-06-09 – 2021-06-17 (×8): 10 mg via ORAL
  Filled 2021-06-09 (×8): qty 1

## 2021-06-09 NOTE — Progress Notes (Signed)
Patient was found by MD trying to get up to bathroom he reorieted her to stay in bed wait for help pt became diaphoretic, confused and then non responsive, this nurse to bedside a blood glucose was checked, EKG completed, bp was found to be significantly lower than previous, rapid was called, lab and radiology at bed side. Bolus started pt started to arouse and was confused stated she just felt sleepy. Pt transported to ct with student nurse and transport on portable monitor at that time pt was back to baseline. Pt requested son was called I was able to reach family for her see chart for additional orders

## 2021-06-09 NOTE — Consult Note (Addendum)
CARDIOLOGY CONSULT NOTE               Patient ID: Betty Nielsen MRN: [id number] DOB/AGE: [birth year] 75 y.o.  Admit date: 06/06/2021 Referring Physician: Mercy Riding, MD Primary Physician: Steele Sizer, MD  Primary Cardiologist: Lujean Amel, MD  Reason for Consultation: uncontrolled HTN  HPI: Betty Nielsen is a 75 year old female with PMH significant for CAD, NSTEMI s/p PCI/DES to mid RCA (2018), h/o acute ischemic CVA, poorly controlled hypertension, hyperlipidemia, CKD stage IIIb, anemia, thyroid disease, PVD and obesity who was admitted to [facility] due to strokelike symptoms and poorly controlled hypertension thus cardiology was consulted.   Hospital course: since admission the patient has had poorly controlled blood pressure. On today, she around 10am this morning the patient experienced a syncopal episode in which was thought to be induced by a rapid decrease in the patient's blood pressure. The patient was found to be in the [id number] systolic with a MAP of [id number] per RR nurse note.  Her ECG revealed SB and her high sensitivity troponin was slightly elevated at 21>>22. She was treated with a 1000cc NS bolus. Per flowsheet documentation, the patient's bp was 198/106 (131 map) at 0721 and the patient was given carvedilol 25 mg, irbesartan 300 mg and clonidine 0.2 mg. At 1130 the patients blood pressure was 154/68 (94 map).  At this time the patient reports that she is doing well and denies having any cardiac symptoms at this time. She reports that at home, she usually only takes clonidine, labetolol and irbesartan and states that she is compliant with her current medications. The patient denies having any dizziness at this time and states that she has not attempted to get out of bed since the episode.   Review of systems complete and found to be negative unless listed above     Past Medical History:  Diagnosis Date   Arthrosis of knee    Chronic kidney disease    Hyperlipidemia     Hypertension    Loss of hearing    Metabolic syndrome    Plantar fasciitis    Renal insufficiency     Past Surgical History:  Procedure Laterality Date   ABDOMINAL AORTIC ANEURYSM REPAIR     ABDOMINAL HYSTERECTOMY     APPENDECTOMY     CORONARY STENT INTERVENTION N/A 10/26/2016   Procedure: Coronary Stent Intervention;  Surgeon: Yolonda Kida, MD;  Location: [facility];  Service: Cardiovascular;  Laterality: N/A;   HERNIA REPAIR     INNER EAR SURGERY     pt not sure of type   LEFT HEART CATH AND CORONARY ANGIOGRAPHY N/A 10/26/2016   Procedure: Left Heart Cath and Coronary Angiography;  Surgeon: Teodoro Spray, MD;  Location: [facility];  Service: Cardiovascular;  Laterality: N/A;   THYROIDECTOMY      Medications Prior to Admission  Medication Sig Dispense Refill Last Dose   atorvastatin (LIPITOR) 40 MG tablet Take 1 tablet (40 mg total) by mouth daily at 6 PM. Cholesterol medication. 90 tablet 1 06/06/2021   cetirizine (ZYRTEC) 10 MG tablet Take 1 tablet (10 mg total) by mouth daily as needed. PRN only   06/06/2021   Cholecalciferol 25 MCG (1000 UT) tablet Take 1,000 Units by mouth daily.    06/06/2021   clopidogrel (PLAVIX) 75 MG tablet TAKE 1 TABLET BY MOUTH ONCE DAILY. BLOOD THINNER TO HELP REDUCE STROKE RISK 90 tablet 1 06/06/2021   levothyroxine (SYNTHROID) 75 MCG tablet Take  1 tablet (75 mcg total) by mouth daily before breakfast. 90 tablet 1 06/06/2021   Omega-3 Fatty Acids (FISH OIL) 1000 MG CPDR Take 2 capsules by mouth daily.   06/06/2021   montelukast (SINGULAIR) 10 MG tablet Take 1 tablet (10 mg total) by mouth at bedtime. (Patient not taking: Reported on 06/06/2021) 30 tablet 3 Not Taking   nitroGLYCERIN (NITROSTAT) 0.4 MG SL tablet Place under the tongue.   prn at prn   Social History   Socioeconomic History   Marital status: Widowed    Spouse name: Not on file   Number of children: 1   Years of education: GED   Highest education level: 10th grade   Occupational History   Occupation: Retired  Tobacco Use   Smoking status: Former    Packs/day: 0.50    Years: 20.00    Pack years: 10.00    Types: Cigarettes    Quit date: 2000    Years since quitting: 22.7   Smokeless tobacco: Never   Tobacco comments:    smoking cessation materials not required  Vaping Use   Vaping Use: Never used  Substance and Sexual Activity   Alcohol use: No    Alcohol/week: 0.0 standard drinks   Drug use: No   Sexual activity: Not Currently  Other Topics Concern   Not on file  Social History Narrative   Lives alone   Retired from working a [employer] Strain: Low Risk    Difficulty of Paying Living Expenses: Not hard at all  Food Insecurity: No Food Insecurity   Worried About Charity fundraiser in the Last Year: Never true   Arboriculturist in the Last Year: Never true  Transportation Needs: No Transportation Needs   Lack of Transportation (Medical): No   Lack of Transportation (Non-Medical): No  Physical Activity: Inactive   Days of Exercise per Week: 0 days   Minutes of Exercise per Session: 0 min  Stress: No Stress Concern Present   Feeling of Stress : Not at all  Social Connections: Moderately Isolated   Frequency of Communication with Friends and Family: More than three times a week   Frequency of Social Gatherings with Friends and Family: Once a week   Attends Religious Services: More than 4 times per year   Active Member of [employer] or Organizations: No   Attends Archivist Meetings: Never   Marital Status: Widowed  Human resources officer Violence: Not At Risk   Fear of Current or Ex-Partner: No   Emotionally Abused: No   Physically Abused: No   Sexually Abused: No    Family History  Problem Relation Age of Onset   Healthy Mother    Cerebral aneurysm Father       Review of systems complete and found to be negative unless listed above      PHYSICAL EXAM  General: Well  developed, well nourished, in no acute distress HEENT:  Normocephalic and atramatic. PERRL Neck:  No JVD.  Lungs: Clear bilaterally to auscultation, chest expansion symmetrical, normal effort of breathing, no wheezes, rales or rhonchi Heart: HRRR . Normal S1 and S2 without gallops or murmurs.  Abdomen: Bowel sounds are positive, abdomen soft and non-tender  Msk:  Normal strength and tone for age. Extremities: No clubbing, cyanosis or edema.   Neuro: Alert and oriented X 3. Psych:  Good affect, responds appropriately  Labs:   Lab Results  Component  Value Date   WBC 6.7 06/09/2021   HGB 10.4 (L) 06/09/2021   HCT 30.5 (L) 06/09/2021   MCV 93.0 06/09/2021   PLT 242 06/09/2021    Recent Labs  Lab 06/07/21 0740 06/08/21 0707 06/09/21 0505  NA 132*   < > 133*  K 4.7   < > 4.2  CL 99   < > 99  CO2 23   < > 24  BUN 24*   < > 30*  CREATININE 1.87*   < > 2.55*  CALCIUM 9.6   < > 9.2  PROT 6.9  --   --   BILITOT 0.7  --   --   ALKPHOS 84  --   --   ALT 12  --   --   AST 23  --   --   GLUCOSE 109*   < > 92   < > = values in this interval not displayed.   Lab Results  Component Value Date   CKTOTAL 112 04/05/2012   CKMB 0.5 04/05/2012   TROPONINI <0.03 08/29/2018    Lab Results  Component Value Date   CHOL 214 (H) 06/07/2021   CHOL 185 06/18/2020   CHOL 361 (H) 10/27/2019   Lab Results  Component Value Date   HDL 70 06/07/2021   HDL 63 06/18/2020   HDL 53 10/27/2019   Lab Results  Component Value Date   LDLCALC 128 (H) 06/07/2021   LDLCALC 104 (H) 06/18/2020   LDLCALC 279 (H) 10/27/2019   Lab Results  Component Value Date   TRIG 82 06/07/2021   TRIG 88 06/18/2020   TRIG 143 10/27/2019   Lab Results  Component Value Date   CHOLHDL 3.1 06/07/2021   CHOLHDL 2.9 06/18/2020   CHOLHDL 6.8 10/27/2019   No results found for: LDLDIRECT    Radiology: CT HEAD WO CONTRAST (5MM)  Result Date: 06/09/2021 CLINICAL DATA:  Mental status change, unknown cause EXAM: CT  HEAD WITHOUT CONTRAST TECHNIQUE: Contiguous axial images were obtained from the base of the skull through the vertex without intravenous contrast. COMPARISON:  06/06/2021 FINDINGS: Brain: No evidence of acute infarction, hemorrhage, hydrocephalus, extra-axial collection or mass lesion/mass effect. Scattered low-density changes within the periventricular and subcortical Ladarrell Cornwall matter compatible with chronic microvascular ischemic change. Mild diffuse cerebral volume loss. Vascular: Atherosclerotic calcifications involving the large vessels of the skull base. No unexpected hyperdense vessel. Skull: Normal. Negative for fracture or focal lesion. Sinuses/Orbits: No acute finding. Other: None. IMPRESSION: No acute intracranial findings.  Stable exam. Electronically Signed   By: Davina Poke D.O.   On: 06/09/2021 11:37   CT HEAD WO CONTRAST  Result Date: 06/06/2021 CLINICAL DATA:  Transient ischemic attack EXAM: CT HEAD WITHOUT CONTRAST TECHNIQUE: Contiguous axial images were obtained from the base of the skull through the vertex without intravenous contrast. COMPARISON:  None. FINDINGS: Brain: There is no mass, hemorrhage or extra-axial collection. The size and configuration of the ventricles and extra-axial CSF spaces are normal. The brain parenchyma is normal, without acute or chronic infarction. Vascular: No abnormal hyperdensity of the major intracranial arteries or dural venous sinuses. No intracranial atherosclerosis. Skull: The visualized skull base, calvarium and extracranial soft tissues are normal. Sinuses/Orbits: No fluid levels or advanced mucosal thickening of the visualized paranasal sinuses. No mastoid or middle ear effusion. The orbits are normal. IMPRESSION: Normal head CT. Electronically Signed   By: Ulyses Jarred M.D.   On: 06/06/2021 21:47   MR ANGIO HEAD WO CONTRAST  Result Date: 06/07/2021 CLINICAL DATA:  Left arm weakness and facial tingling EXAM: MRA HEAD WITHOUT CONTRAST TECHNIQUE:  Angiographic images of the Circle of Willis were acquired using MRA technique without intravenous contrast. COMPARISON:  No pertinent prior exam. FINDINGS: POSTERIOR CIRCULATION: --Vertebral arteries: Normal --Inferior cerebellar arteries: Normal. --Basilar artery: Normal. --Superior cerebellar arteries: Normal. --Posterior cerebral arteries: Normal. ANTERIOR CIRCULATION: --Intracranial internal carotid arteries: Normal. --Anterior cerebral arteries (ACA): Normal. --Middle cerebral arteries (MCA): Normal. ANATOMIC VARIANTS: Fetal origin of the right PCA. IMPRESSION: Normal intracranial MRA. Electronically Signed   By: Ulyses Jarred M.D.   On: 06/07/2021 01:31   MR BRAIN WO CONTRAST  Result Date: 06/07/2021 CLINICAL DATA:  Acute neurologic deficit EXAM: MRI HEAD WITHOUT CONTRAST TECHNIQUE: Multiplanar, multiecho pulse sequences of the brain and surrounding structures were obtained without intravenous contrast. COMPARISON:  None. FINDINGS: Brain: No acute infarct, mass effect or extra-axial collection. No acute or chronic hemorrhage. There is multifocal hyperintense T2-weighted signal within the Keona Sheffler matter. Parenchymal volume and CSF spaces are normal. A partially empty sella is incidentally noted. Vascular: Major flow voids are preserved. Skull and upper cervical spine: Normal calvarium and skull base. Visualized upper cervical spine and soft tissues are normal. Sinuses/Orbits:No paranasal sinus fluid levels or advanced mucosal thickening. No mastoid or middle ear effusion. Normal orbits. IMPRESSION: 1. No acute intracranial abnormality. 2. Findings of chronic small vessel ischemia. Electronically Signed   By: Ulyses Jarred M.D.   On: 06/07/2021 01:35   US Carotid Bilateral  Result Date: 06/07/2021 CLINICAL DATA:  Acute neurologic deficit. EXAM: BILATERAL CAROTID DUPLEX ULTRASOUND TECHNIQUE: Pearline Cables scale imaging, color Doppler and duplex ultrasound were performed of bilateral carotid and vertebral arteries in  the neck. COMPARISON:  10/26/2019 FINDINGS: Criteria: Quantification of carotid stenosis is based on velocity parameters that correlate the residual internal carotid diameter with NASCET-based stenosis levels, using the diameter of the distal internal carotid lumen as the denominator for stenosis measurement. The following velocity measurements were obtained: RIGHT ICA: 122/41 cm/sec CCA: [id number] cm/sec SYSTOLIC ICA/CCA RATIO: 1.7 ECA: 67 cm/sec LEFT ICA: 109/29 cm/sec CCA: [id number] cm/sec SYSTOLIC ICA/CCA RATIO: 1.9 ECA: 71 cm/sec RIGHT CAROTID ARTERY: Mild atheromatous plaque at the bifurcation. RIGHT VERTEBRAL ARTERY: Patent with antegrade flow and normal waveform LEFT CAROTID ARTERY:  Mild atheromatous plaque at the bifurcation LEFT VERTEBRAL ARTERY: Patent with antegrade flow and normal waveform IMPRESSION: 1. Mild calcified plaque at the carotid bifurcations. No evidence of hemodynamically significant stenosis. 2. Antegrade flow in the vertebral arteries. Electronically Signed   By: Jorje Guild M.D.   On: 06/07/2021 06:44   DG Chest Port 1 View  Result Date: 06/09/2021 CLINICAL DATA:  Syncope EXAM: PORTABLE CHEST 1 VIEW COMPARISON:  06/06/2021 FINDINGS: Chronic cardiomegaly. Chronic aortic atherosclerosis with tortuosity. Chronic elevation of the left hemidiaphragm. No pulmonary edema. No infiltrate or active collapse. No effusion. IMPRESSION: Chronic cardiomegaly and aortic atherosclerotic disease. Chronic elevation of the left hemidiaphragm. No change since the previous study. Electronically Signed   By: Nelson Chimes M.D.   On: 06/09/2021 11:08   DG Chest Portable 1 View  Result Date: 06/06/2021 CLINICAL DATA:  Shortness of breath. Evaluate for edema. Dizziness for 3 weeks. Left arm tingling and numbness started a. EXAM: PORTABLE CHEST 1 VIEW COMPARISON:  10/25/2019 FINDINGS: Cardiac enlargement. No vascular congestion, edema, or consolidation. Lungs are clear. Segmental elevation of the left  hemidiaphragm similar to prior study. No pleural effusions. No pneumothorax. Calcified and tortuous aorta. IMPRESSION: Cardiac enlargement.  No evidence of  active pulmonary disease. Electronically Signed   By: Lucienne Capers M.D.   On: 06/06/2021 22:07   ECHOCARDIOGRAM COMPLETE BUBBLE STUDY  Result Date: 06/07/2021    ECHOCARDIOGRAM REPORT   Patient Name:   REMIYAH DUEY Worley Date of Exam: 06/07/2021 Medical Rec #:  [id number]       Height:       65.0 in Accession #:    [id number]      Weight:       187.0 lb Date of Birth:  [birth year]        BSA:          1.922 m Patient Age:    36 years        BP:           212/185 mmHg Patient Gender: F               HR:           62 bpm. Exam Location:  [facility] Procedure: 2D Echo, Color Doppler and Cardiac Doppler Indications:     TIA (transient ischemic attack) 435.9 / G45.9  History:         Patient has prior history of Echocardiogram examinations. Risk                  Factors:Hypertension. HLD.  Sonographer:     Alyse Low Roar Referring Phys:  [id number] Rhetta Mura Diagnosing Phys: Ida Rogue MD IMPRESSIONS  1. Left ventricular ejection fraction, by estimation, is 60 to 65%. The left ventricle has normal function. The left ventricle has no regional wall motion abnormalities. There is mild left ventricular hypertrophy. Left ventricular diastolic parameters are consistent with Grade I diastolic dysfunction (impaired relaxation). The average left ventricular global longitudinal strain is -20.3 %. The global longitudinal strain is normal.  2. Right ventricular systolic function is normal. The right ventricular size is normal.  3. The mitral valve is normal in structure. No evidence of mitral valve regurgitation. No evidence of mitral stenosis.  4. The aortic valve was not well visualized. Aortic valve regurgitation is not visualized. Mild aortic valve sclerosis is present, with no evidence of aortic valve stenosis.  5. The inferior vena cava is normal in size with greater than  50% respiratory variability, suggesting right atrial pressure of 3 mmHg. FINDINGS  Left Ventricle: Left ventricular ejection fraction, by estimation, is 60 to 65%. The left ventricle has normal function. The left ventricle has no regional wall motion abnormalities. The average left ventricular global longitudinal strain is -20.3 %. The global longitudinal strain is normal. The left ventricular internal cavity size was normal in size. There is mild left ventricular hypertrophy. Left ventricular diastolic parameters are consistent with Grade I diastolic dysfunction (impaired relaxation). Right Ventricle: The right ventricular size is normal. No increase in right ventricular wall thickness. Right ventricular systolic function is normal. Left Atrium: Left atrial size was normal in size. Right Atrium: Right atrial size was normal in size. Pericardium: There is no evidence of pericardial effusion. Mitral Valve: The mitral valve is normal in structure. No evidence of mitral valve regurgitation. No evidence of mitral valve stenosis. Tricuspid Valve: The tricuspid valve is normal in structure. Tricuspid valve regurgitation is not demonstrated. No evidence of tricuspid stenosis. Aortic Valve: The aortic valve was not well visualized. Aortic valve regurgitation is not visualized. Mild aortic valve sclerosis is present, with no evidence of aortic valve stenosis. Aortic valve peak gradient measures 11.1 mmHg. Pulmonic Valve: The pulmonic valve was normal  in structure. Pulmonic valve regurgitation is trivial. No evidence of pulmonic stenosis. Aorta: The aortic root is normal in size and structure. Venous: The inferior vena cava is normal in size with greater than 50% respiratory variability, suggesting right atrial pressure of 3 mmHg. IAS/Shunts: No atrial level shunt detected by color flow Doppler.  LEFT VENTRICLE PLAX 2D LVIDd:         4.50 cm  Diastology LVIDs:         3.20 cm  LV e' medial:    5.33 cm/s LV PW:         1.10 cm   LV E/e' medial:  16.7 LV IVS:        1.30 cm  LV e' lateral:   5.33 cm/s LVOT diam:     2.00 cm  LV E/e' lateral: 16.7 LVOT Area:     3.14 cm                         2D Longitudinal Strain                         2D Strain GLS Avg:     -20.3 % RIGHT VENTRICLE RV Basal diam:  3.00 cm RV Mid diam:    2.50 cm RV S prime:     14.00 cm/s TAPSE (M-mode): 2.4 cm LEFT ATRIUM             Index       RIGHT ATRIUM           Index LA diam:        3.90 cm 2.03 cm/m  RA Area:     15.40 cm LA Vol (A2C):   55.9 ml 29.08 ml/m RA Volume:   38.90 ml  20.23 ml/m LA Vol (A4C):   53.7 ml 27.93 ml/m LA Biplane Vol: 56.7 ml 29.49 ml/m  AORTIC VALVE                PULMONIC VALVE AV Area (Vmax): 2.57 cm    PV Vmax:          1.05 m/s AV Vmax:        166.50 cm/s PV Peak grad:     4.4 mmHg AV Peak Grad:   11.1 mmHg   PR End Diast Vel: 4.84 msec LVOT Vmax:      136.00 cm/s RVOT Peak grad:   4 mmHg  AORTA Ao Root diam: 2.80 cm MITRAL VALVE MV Area (PHT): 3.39 cm     SHUNTS MV Decel Time: 224 msec     Systemic Diam: 2.00 cm MV E velocity: 89.20 cm/s MV A velocity: 123.00 cm/s MV E/A ratio:  0.73 MV A Prime:    11.0 cm/s Ida Rogue MD Electronically signed by Ida Rogue MD Signature Date/Time: 06/07/2021/6:32:37 PM    Final     EKG: Sinus bradycardia without any evidence of ST changes  ASSESSMENT AND PLAN:  Betty Nielsen is a 75 year old female with PMH significant for CAD, NSTEMI s/p PCI/DES to mid RCA (2018), h/o acute ischemic CVA, poorly controlled hypertension, hyperlipidemia, CKD stage IIIb, anemia, thyroid disease, PVD and obesity who was admitted to [facility] due to strokelike symptoms and poorly controlled hypertension with a recent episode of witnessed syncope while inpatient, thus cardiology was consulted.    Syncope, unclear etiology, considering vasovagal syncope, versus rapid decrease in blood pressure versus orthostatic hypotension ECG was unremarkable.   High-sensitivity troponin was  unremarkable. Recommend  continuous cardiac monitoring until discharge. Recommend monitoring blood pressure closely per protocol. Recommend ambulating assistance.  Poorly controlled blood pressure, patient is hypertensive at this time with a systolic blood pressure of 178 Recommend continuing carvedilol 25 mg once daily and irbesartan 300 mg once daily. Recommend clonidine 0.1 mg three times daily. Monitor HR closely.  We will add hydrochlorothiazide 12.5 mg once daily. Proceed with renal ultrasound to rule out renal artery stenosis. Consider restarting labetalol orally per outpatient dosage. (Previously on '100mg'$  twice daily) Low-sodium, heart healthy diet recommended at this time. Strict intake and output.   History of CVA with current strokelike symptoms Recommend careful blood pressure control as the patient's history of CVA indicates ischemic etiology. Recommend continuing Plavix for antiplatelet therapy.  Hyperlipidemia, reasonably controlled Recommend continuing atorvastatin therapy.  CKD stage IIIb, fairly stable Recommend trending CMP. Proceed with renal artery ultrasound.  Signed: Nariya Neumeyer ACNPC-AG 06/09/2021, 4:45 PM

## 2021-06-09 NOTE — Progress Notes (Signed)
Rechecked on Betty Nielsen after previous rapid response call. When arrived in patient's room, patient was alert  in bed, sitting up and eating with family in room. She stated she felt better than earlier and did not need anything from this RN at this time. HR low 60s regular and last SBP in 140s.

## 2021-06-09 NOTE — Significant Event (Signed)
Rapid Response Event Note   Reason for Call :  Lethargy, diaphoresis  Initial Focused Assessment:  Rapid response RN arrived in patient's room with patient lying in bed, surrounded by 2A staff, SWOT RN, and patient's attending MD. Patient had received BP meds this morning for extreme hypertension and was now bradycardic, lethargic, and diaphoretic. Current BP with SBP in 130s and MAP in 90s, HR mid to low 50s SB. MD suspected that patient was too used to having a high BP and was not tolerating a BP within a more typical range. MD already ordered bolus which 2A staff had started.  Interventions:  None by this RN. Patient getting new IV placed for increased access. 12 lead EKG ordered and being obtained.  Plan of Care:  MD evaluating patient's orders including level of care. No further needs from 2A staff from rapid response but will reach out if further needs.  Event Summary:   MD Notified: Dr. Cyndia Skeeters Call Time: 10:18 Arrival Time: 10:19 End Time: 10:25  Stephanie Acre, RN

## 2021-06-09 NOTE — Progress Notes (Signed)
PT Cancellation Note  Patient Details Name: Betty Nielsen MRN: [id number] DOB: 06-01-1946   Cancelled Treatment:    Reason Eval/Treat Not Completed: Patient not medically ready. Patient with Rapid Response this morning. Will hold at this time. Re-attempt at later time/day when appropriate.     [name] 06/09/2021, 10:38 AM

## 2021-06-09 NOTE — Progress Notes (Signed)
PROGRESS NOTE  Betty Nielsen [id number] DOB: [birth year]   PCP: Steele Sizer, MD  Patient is from: Home.  Lives alone.  Independently ambulates at baseline.  DOA: 06/06/2021 LOS: 0  Chief complaints:  Chief Complaint  Patient presents with   Dizziness   Numbness     Brief Narrative / Interim history: 75 year old F with PMH of right lacunar CVA in 10/2019, uncontrolled HTN, CKD-3B, hyponatremia, HLD and anemia of chronic disease presenting with left upper extremity and left facial numbness since 06/05/21 at 9 PM.  Patient was outside tPA window on arrival to ED the next day.  CVA work-up including CT head, MRI brain, MRA head, carotid US and TTE without acute significant finding.  BP as high as 226/115 while in ED.  Blood pressure remained elevated despite titration of a antihypertensive meds, then she had presyncope with sudden drop in her blood pressure to 130s/70s the morning of 9/20.  She was also bradycardic to mid 50s.  EKG, CXR and CT chest without contrast without acute finding.  Troponin flat at 20.  She felt well after a liter of NS bolus.  Cardiology consulted.  Subjective: Seen and examined earlier this morning and later this afternoon.  Patient was sitting in the bed with her breakfast tray in front of her.  She was minimally arousable.  Then, she tried to get out of the bed to go to the bathroom and became diaphoretic.  She had some urinary incontinence but no seizure-like activity.  Rapid response called.  She was put in Trendelenburg position.  CBG in 130s.  BP in 130s/70s. Slightly bradycardic to 55.  She had no focal neurodeficit.  Stat EKG, CXR, troponin and CT head ordered.  Patient improved quickly after NS bolus with improved mentation but had no recollection of what happened.  She had no complaints of pain, vision change, focal numbness, weakness or tingling.  Objective: Vitals:   06/08/21 2347 06/09/21 0412 06/09/21 0510 06/09/21 0721  BP: (!) 198/86 (!) 206/100   (!) 198/106  Pulse: 67 62  66  Resp: '15 18  18  '$ Temp: 98.5 F (36.9 C) 98.3 F (36.8 C)  98.7 F (37.1 C)  TempSrc: Oral   Oral  SpO2: 100% 95%  96%  Weight:   80.9 kg   Height:       No intake or output data in the 24 hours ending 06/09/21 1456 Filed Weights   06/06/21 2108 06/08/21 2136 06/09/21 0510  Weight: 84.8 kg 81.3 kg 80.9 kg    Examination at 2 PM: GENERAL: No apparent distress.  Nontoxic. HEENT: MMM.  Vision and hearing grossly intact.  NECK: Supple.  No apparent JVD.  RESP: 96% on RA.  No IWOB.  Fair aeration bilaterally. CVS:  RRR. Heart sounds normal.  ABD/GI/GU: BS+. Abd soft, NTND.  MSK/EXT:  Moves extremities. No apparent deformity. No edema.  SKIN: no apparent skin lesion or wound NEURO: Awake, alert and oriented appropriately. Speech clear. Cranial nerves II-XII intact. Motor 5/5 in all muscle groups of UE and LE bilaterally, Normal tone. Light sensation intact in all dermatomes of upper and lower ext bilaterally. Patellar reflex symmetric.  No pronator drift.  Finger to nose intact. PSYCH: Calm. Normal affect.   Procedures:  None  Microbiology summarized: [id number] and influenza PCR nonreactive.  Assessment & Plan: Syncopal episode-with some LOC but no fall.  She was assisted down in bed in Trendelenburg position and recovered quickly after IV fluid.  She has urinary  incontinence but no seizure-like activity.  She was not postictal.  She had no focal neurodeficit to suggest CVA.  Syncope likely due to medication.  Blood pressure has been persistently high until this morning. We increased clonidine from 0.1 to 0.2 mg.  BP dropped from 198/106 to 130's/70's which might be very low for her about 2 hours after her morning medication.  CT head, CXR, EKG and serial troponin without acute finding.  Per patient's son, she tends to have similar problem intermittently even sitting down.   -Decrease clonidine back to 0.1 mg 3 times daily.  Would like to wean off this  completely if BP improves  -Will adjust other antihypertensive meds as appropriate -Cardiology consulted for assistance -Continue telemetry monitoring -Check serum and urine catecholamine, renal artery Doppler, renin and aldosterone -She definitely needs a sleep study  Uncontrolled hypertension-see above. -Decrease clonidine to 0.1 mg 3 times daily -Will adjust evening dose of Coreg based on blood pressure and heart rate -Continue home Avapro -IV labetalol 10 mg as needed.  She does not want to take hydralazine. -Work-up for secondary hypertension as above -Needs sleep study outpatient.  LUE/left face paresthesia-due to uncontrolled hypertension?  Neuro exam including sensory exam intact.  CT head, MRI brain, MRA head, TTE and bilateral carotid US without significant finding. LDL 128.  A1c 5.7%.  Symptoms resolved.  -Continue home Plavix and atorvastatin -Manage hypertension as below  AKI on CKD-3B/azotemia: Creatinine seems to be plateauing. Recent Labs    06/18/20 1357 06/06/21 2110 06/07/21 0740 06/08/21 0707 06/09/21 0505  BUN 18 26* 24* 26* 30*  CREATININE 1.82* 1.98* 1.87* 2.51* 2.55*  -Recheck in the morning  History of right right basal ganglia CVA/hyperlipidemia -Continue Lipitor and Plavix as above -Optimize blood pressure as above  At risk for sleep apnea: reports snoring that wakes her up from sleep. -Needs outpatient sleep study.  Hypothyroidism -Continue home Synthroid.  GERD: Reports taking Prevacid at home.  Allergy to Protonix? -P.o. Pepcid 10 mg daily -GI cocktail as needed  Body mass index is 29.69 kg/m.         DVT prophylaxis:  heparin injection 5,000 Units Start: 06/07/21 1630 SCDs Start: 06/06/21 2340  Code Status: Full code Family Communication: Updated patient's son and daughter-in-law at bedside  Level of care: Stepdown Status is: Observation  The patient will require care spanning > 2 midnights and should be moved to inpatient  because: Hemodynamically unstable, Ongoing diagnostic testing needed not appropriate for outpatient work up, and Inpatient level of care appropriate due to severity of illness  Dispo: The patient is from: Home              Anticipated d/c is to: Home              Patient currently is not medically stable to d/c.   Difficult to place patient No       Consultants:  None   Sch Meds:  Scheduled Meds:  atorvastatin  40 mg Oral q1800   carvedilol  25 mg Oral BID WC   cloNIDine  0.1 mg Oral TID   clopidogrel  75 mg Oral Daily   famotidine  10 mg Oral Daily   heparin  5,000 Units Subcutaneous Q8H   hydrALAZINE  10 mg Intravenous Once   irbesartan  300 mg Oral Daily   levothyroxine  75 mcg Oral Q0600   Continuous Infusions: PRN Meds:.acetaminophen **OR** acetaminophen, alum & mag hydroxide-simeth, labetalol, ondansetron (ZOFRAN) IV  Antimicrobials: Anti-infectives (  From admission, onward)    None        I have personally reviewed the following labs and images: CBC: Recent Labs  Lab 06/06/21 2110 06/07/21 0740 06/08/21 0707 06/09/21 0505  WBC 5.8 7.8 7.0 6.7  NEUTROABS 2.5  --   --   --   HGB 10.0* 10.9* 10.8* 10.4*  HCT 29.9* 32.1* 32.1* 30.5*  MCV 94.0 91.7 90.9 93.0  PLT 213 233 255 242   BMP &GFR Recent Labs  Lab 06/06/21 2110 06/06/21 2345 06/07/21 0740 06/08/21 0707 06/09/21 0505  NA 131*  --  132* 131* 133*  K 4.4  --  4.7 4.5 4.2  CL 98  --  99 98 99  CO2 25  --  '23 23 24  '$ GLUCOSE 119*  --  109* 102* 92  BUN 26*  --  24* 26* 30*  CREATININE 1.98*  --  1.87* 2.51* 2.55*  CALCIUM 9.0  --  9.6 9.6 9.2  MG  --  2.0 2.0 1.9 2.1  PHOS  --   --   --  4.1 4.4   Estimated Creatinine Clearance: 20 mL/min (A) (by C-G formula based on SCr of 2.55 mg/dL (H)). Liver & Pancreas: Recent Labs  Lab 06/06/21 2110 06/07/21 0740 06/08/21 0707 06/09/21 0505  AST 20 23  --   --   ALT 12 12  --   --   ALKPHOS 89 84  --   --   BILITOT 0.6 0.7  --   --   PROT  6.6 6.9  --   --   ALBUMIN 3.7 3.8 3.9 3.4*   No results for input(s): LIPASE, AMYLASE in the last 168 hours. No results for input(s): AMMONIA in the last 168 hours. Diabetic: Recent Labs    06/07/21 0740  HGBA1C 5.7*   Recent Labs  Lab 06/06/21 2110 06/09/21 1015  GLUCAP 114* 133*   Cardiac Enzymes: No results for input(s): CKTOTAL, CKMB, CKMBINDEX, TROPONINI in the last 168 hours. No results for input(s): PROBNP in the last 8760 hours. Coagulation Profile: Recent Labs  Lab 06/06/21 2110  INR 1.0   Thyroid Function Tests: No results for input(s): TSH, T4TOTAL, FREET4, T3FREE, THYROIDAB in the last 72 hours. Lipid Profile: Recent Labs    06/07/21 0740  CHOL 214*  HDL 70  LDLCALC 128*  TRIG 82  CHOLHDL 3.1   Anemia Panel: No results for input(s): VITAMINB12, FOLATE, FERRITIN, TIBC, IRON, RETICCTPCT in the last 72 hours. Urine analysis:    Component Value Date/Time   COLORURINE YELLOW 06/07/2021 0912   APPEARANCEUR CLEAR 06/07/2021 0912   APPEARANCEUR Clear 04/04/2012 1438   LABSPEC 1.020 06/07/2021 0912   LABSPEC 1.004 04/04/2012 1438   PHURINE 7.0 06/07/2021 0912   GLUCOSEU NEGATIVE 06/07/2021 0912   GLUCOSEU Negative 04/04/2012 1438   HGBUR MODERATE (A) 06/07/2021 0912   BILIRUBINUR NEGATIVE 06/07/2021 0912   BILIRUBINUR Negative 04/04/2012 1438   KETONESUR NEGATIVE 06/07/2021 0912   PROTEINUR 100 (A) 06/07/2021 0912   UROBILINOGEN 0.2 04/15/2008 0946   NITRITE NEGATIVE 06/07/2021 0912   LEUKOCYTESUR NEGATIVE 06/07/2021 0912   LEUKOCYTESUR Trace 04/04/2012 1438   Sepsis Labs: Invalid input(s): PROCALCITONIN, [place]  Microbiology: Recent Results (from the past 240 hour(s))  Resp Panel by RT-PCR (Flu A&B, Covid) Nasopharyngeal Swab     Status: None   Collection Time: 06/06/21  9:57 PM   Specimen: Nasopharyngeal Swab; Nasopharyngeal(NP) swabs in vial transport medium  Result Value Ref Range Status  SARS Coronavirus 2 by RT PCR NEGATIVE  NEGATIVE Final    Comment: (NOTE) SARS-CoV-2 target nucleic acids are NOT DETECTED.  The SARS-CoV-2 RNA is generally detectable in upper respiratory specimens during the acute phase of infection. The lowest concentration of SARS-CoV-2 viral copies this assay can detect is 138 copies/mL. A negative result does not preclude SARS-Cov-2 infection and should not be used as the sole basis for treatment or other patient management decisions. A negative result may occur with  improper specimen collection/handling, submission of specimen other than nasopharyngeal swab, presence of viral mutation(s) within the areas targeted by this assay, and inadequate number of viral copies(<138 copies/mL). A negative result must be combined with clinical observations, patient history, and epidemiological information. The expected result is Negative.  Fact Sheet for Patients:  EntrepreneurPulse.com.au  Fact Sheet for Healthcare Providers:  IncredibleEmployment.be  This test is no t yet approved or cleared by the [place] FDA and  has been authorized for detection and/or diagnosis of SARS-CoV-2 by FDA under an Emergency Use Authorization (EUA). This EUA will remain  in effect (meaning this test can be used) for the duration of the COVID-19 declaration under Section 564(b)(1) of the Act, 21 U.S.C.section 360bbb-3(b)(1), unless the authorization is terminated  or revoked sooner.       Influenza A by PCR NEGATIVE NEGATIVE Final   Influenza B by PCR NEGATIVE NEGATIVE Final    Comment: (NOTE) The Xpert Xpress SARS-CoV-2/FLU/RSV plus assay is intended as an aid in the diagnosis of influenza from Nasopharyngeal swab specimens and should not be used as a sole basis for treatment. Nasal washings and aspirates are unacceptable for Xpert Xpress SARS-CoV-2/FLU/RSV testing.  Fact Sheet for Patients: EntrepreneurPulse.com.au  Fact Sheet for Healthcare  Providers: IncredibleEmployment.be  This test is not yet approved or cleared by the [place] FDA and has been authorized for detection and/or diagnosis of SARS-CoV-2 by FDA under an Emergency Use Authorization (EUA). This EUA will remain in effect (meaning this test can be used) for the duration of the COVID-19 declaration under Section 564(b)(1) of the Act, 21 U.S.C. section 360bbb-3(b)(1), unless the authorization is terminated or revoked.  Performed at [facility], [address], [place], [place] [address]     Radiology Studies: CT HEAD WO CONTRAST (5MM)  Result Date: 06/09/2021 CLINICAL DATA:  Mental status change, unknown cause EXAM: CT HEAD WITHOUT CONTRAST TECHNIQUE: Contiguous axial images were obtained from the base of the skull through the vertex without intravenous contrast. COMPARISON:  06/06/2021 FINDINGS: Brain: No evidence of acute infarction, hemorrhage, hydrocephalus, extra-axial collection or mass lesion/mass effect. Scattered low-density changes within the periventricular and subcortical white matter compatible with chronic microvascular ischemic change. Mild diffuse cerebral volume loss. Vascular: Atherosclerotic calcifications involving the large vessels of the skull base. No unexpected hyperdense vessel. Skull: Normal. Negative for fracture or focal lesion. Sinuses/Orbits: No acute finding. Other: None. IMPRESSION: No acute intracranial findings.  Stable exam. Electronically Signed   By: Davina Poke D.O.   On: 06/09/2021 11:37   DG Chest Port 1 View  Result Date: 06/09/2021 CLINICAL DATA:  Syncope EXAM: PORTABLE CHEST 1 VIEW COMPARISON:  06/06/2021 FINDINGS: Chronic cardiomegaly. Chronic aortic atherosclerosis with tortuosity. Chronic elevation of the left hemidiaphragm. No pulmonary edema. No infiltrate or active collapse. No effusion. IMPRESSION: Chronic cardiomegaly and aortic atherosclerotic disease. Chronic elevation of the  left hemidiaphragm. No change since the previous study. Electronically Signed   By: Nelson Chimes M.D.   On: 06/09/2021 11:08  Amna Welker T. Clifton  If 7PM-7AM, please contact night-coverage [email or web address] 06/09/2021, 2:56 PM

## 2021-06-10 ENCOUNTER — Inpatient Hospital Stay: Payer: Medicare Other

## 2021-06-10 DIAGNOSIS — I151 Hypertension secondary to other renal disorders: Secondary | ICD-10-CM | POA: Diagnosis not present

## 2021-06-10 DIAGNOSIS — N2889 Other specified disorders of kidney and ureter: Secondary | ICD-10-CM | POA: Diagnosis not present

## 2021-06-10 DIAGNOSIS — N179 Acute kidney failure, unspecified: Secondary | ICD-10-CM | POA: Diagnosis not present

## 2021-06-10 DIAGNOSIS — N189 Chronic kidney disease, unspecified: Secondary | ICD-10-CM

## 2021-06-10 DIAGNOSIS — R55 Syncope and collapse: Secondary | ICD-10-CM | POA: Diagnosis not present

## 2021-06-10 DIAGNOSIS — R42 Dizziness and giddiness: Secondary | ICD-10-CM

## 2021-06-10 DIAGNOSIS — I701 Atherosclerosis of renal artery: Secondary | ICD-10-CM

## 2021-06-10 DIAGNOSIS — I714 Abdominal aortic aneurysm, without rupture: Secondary | ICD-10-CM

## 2021-06-10 DIAGNOSIS — I150 Renovascular hypertension: Secondary | ICD-10-CM

## 2021-06-10 LAB — RENAL FUNCTION PANEL
Albumin: 3.2 g/dL — ABNORMAL LOW (ref 3.5–5.0)
Anion gap: 7 (ref 5–15)
BUN: 30 mg/dL — ABNORMAL HIGH (ref 8–23)
CO2: 26 mmol/L (ref 22–32)
Calcium: 8.9 mg/dL (ref 8.9–10.3)
Chloride: 99 mmol/L (ref 98–111)
Creatinine, Ser: 2.31 mg/dL — ABNORMAL HIGH (ref 0.44–1.00)
GFR, Estimated: 22 mL/min — ABNORMAL LOW (ref >60)
Glucose, Bld: 96 mg/dL (ref 70–99)
Phosphorus: 3.8 mg/dL (ref 2.5–4.6)
Potassium: 4.6 mmol/L (ref 3.5–5.1)
Sodium: 132 mmol/L — ABNORMAL LOW (ref 135–145)

## 2021-06-10 LAB — CBC
HCT: 29.1 % — ABNORMAL LOW (ref 36.0–46.0)
Hemoglobin: 9.5 g/dL — ABNORMAL LOW (ref 12.0–15.0)
MCH: 30 pg (ref 26.0–34.0)
MCHC: 32.6 g/dL (ref 30.0–36.0)
MCV: 91.8 fL (ref 80.0–100.0)
Platelets: 229 10*3/uL (ref 150–400)
RBC: 3.17 MIL/uL — ABNORMAL LOW (ref 3.87–5.11)
RDW: 12.2 % (ref 11.5–15.5)
WBC: 6.7 10*3/uL (ref 4.0–10.5)
nRBC: 0 % (ref 0.0–0.2)

## 2021-06-10 LAB — MAGNESIUM: Magnesium: 2.1 mg/dL (ref 1.7–2.4)

## 2021-06-10 MED ORDER — SODIUM CHLORIDE 0.9 % IV SOLN: INTRAVENOUS | Status: DC

## 2021-06-10 MED ORDER — ALPRAZOLAM 0.25 MG PO TABS
0.2500 mg | ORAL_TABLET | Freq: Two times a day (BID) | ORAL | Status: DC | PRN
  Administered 2021-06-10 – 2021-06-16 (×3): 0.25 mg via ORAL
  Filled 2021-06-10 (×3): qty 1

## 2021-06-10 MED ORDER — LABETALOL HCL 200 MG PO TABS
200.0000 mg | ORAL_TABLET | Freq: Two times a day (BID) | ORAL | Status: DC
  Administered 2021-06-10 – 2021-06-17 (×15): 200 mg via ORAL
  Filled 2021-06-10 (×16): qty 1

## 2021-06-10 MED ORDER — CLONIDINE HCL 0.2 MG/24HR TD PTWK
0.2000 mg | MEDICATED_PATCH | TRANSDERMAL | Status: DC
  Administered 2021-06-10: 0.2 mg via TRANSDERMAL
  Filled 2021-06-10 (×2): qty 1

## 2021-06-10 MED ORDER — CLONIDINE HCL 0.2 MG/24HR TD PTWK
0.2000 mg | MEDICATED_PATCH | TRANSDERMAL | Status: DC
  Filled 2021-06-10: qty 1

## 2021-06-10 MED ORDER — CEFAZOLIN SODIUM-DEXTROSE 2-4 GM/100ML-% IV SOLN
2.0000 g | INTRAVENOUS | Status: DC
  Filled 2021-06-10: qty 100

## 2021-06-10 NOTE — Progress Notes (Signed)
PROGRESS NOTE    Betty Nielsen  [id number] DOB: 1946/08/26 DOA: 06/06/2021 PCP: Steele Sizer, MD   Assessment & Plan:   Principal Problem:   CVA (cerebral vascular accident) [facility]) Active Problems:   Hypertension, benign   Hypothyroidism, postsurgical   CKD (chronic kidney disease), stage III (HCC)   Hyperlipidemia   Chronic hyponatremia   Anemia in chronic kidney disease   Left upper extremity numbness   Left facial numbness   Elevated serum creatinine   Accelerated hypertension  Syncopal episode: with some LOC but no fall. Likely due to medication.  Improved w/ IVFs.    HTN: uncontrolled. Continue on home dose of labetalol, start clonidine patch, continue HCTZ and d/c oral clonidine. 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 Will need repeat imaging in 12 months & will need to f/u outpatient w/ vascular surg    LUE/left face paresthesia: etiology unclear, possibly due to uncontrolled hypertension. CT head, MRI brain, MRA head, TTE and bilateral carotid US without significant finding. Symptoms resolved.   AKI on CKDIIIb: Cr is labile. Avoid nephrotoxic meds   Hx of right right basal ganglia CVA: continue on statin, plavix  HLD: continue on statin   Hypothyroidism: continue on home dose of synthroid   GERD: continue on pepcid    DVT prophylaxis: heparin  Code Status: full  Family Communication:  Disposition Plan: d/c back home   Level of care: Progressive Cardiac  Status is: Inpatient  Remains inpatient appropriate because:Ongoing diagnostic testing needed not appropriate for outpatient work up, IV treatments appropriate due to intensity of illness or inability to take PO, and Inpatient level of care appropriate due to severity of illness  Dispo: The patient is from: Home              Anticipated d/c is to: Home              Patient currently is not medically stable to d/c.   Difficult to place patient :  unclear        Consultants:  Cardio   Procedures:   Antimicrobials:    Subjective: Pt c/o fatigue  Objective: Vitals:   06/10/21 0030 06/10/21 0420 06/10/21 0500 06/10/21 0711  BP: (!) 180/96 (!) 205/91 (!) 203/83 (!) 200/78  Pulse: 61 62  (!) 55  Resp: '16 16  17  '$ Temp: 98.6 F (37 C) 98.5 F (36.9 C)  98.5 F (36.9 C)  TempSrc: Oral   Oral  SpO2: 99% 99%  99%  Weight:      Height:       No intake or output data in the 24 hours ending 06/10/21 0731 Filed Weights   06/06/21 2108 06/08/21 2136 06/09/21 0510  Weight: 84.8 kg 81.3 kg 80.9 kg    Examination:  General exam: Appears calm and comfortable  Respiratory system: Clear to auscultation. Respiratory effort normal. Cardiovascular system: S1 & S2 +. No rubs, gallops or clicks. Gastrointestinal system: Abdomen is nondistended, soft and nontender. Normal bowel sounds heard. Central nervous system: Alert and oriented. Moves all extremities  Psychiatry: Judgement and insight appear normal. Anxious mood and affect    Data Reviewed: I have personally reviewed following labs and imaging studies  CBC: Recent Labs  Lab 06/06/21 2110 06/07/21 0740 06/08/21 0707 06/09/21 0505 06/10/21 0510  WBC 5.8 7.8 7.0 6.7 6.7  NEUTROABS 2.5  --   --   --   --  HGB 10.0* 10.9* 10.8* 10.4* 9.5*  HCT 29.9* 32.1* 32.1* 30.5* 29.1*  MCV 94.0 91.7 90.9 93.0 91.8  PLT 213 233 255 242 [id number]   Basic Metabolic Panel: Recent Labs  Lab 06/06/21 2110 06/06/21 2345 06/07/21 0740 06/08/21 0707 06/09/21 0505 06/10/21 0510  NA 131*  --  132* 131* 133* 132*  K 4.4  --  4.7 4.5 4.2 4.6  CL 98  --  99 98 99 99  CO2 25  --  '23 23 24 26  '$ GLUCOSE 119*  --  109* 102* 92 96  BUN 26*  --  24* 26* 30* 30*  CREATININE 1.98*  --  1.87* 2.51* 2.55* 2.31*  CALCIUM 9.0  --  9.6 9.6 9.2 8.9  MG  --  2.0 2.0 1.9 2.1 2.1  PHOS  --   --   --  4.1 4.4 3.8   GFR: Estimated Creatinine Clearance: 22.1 mL/min (A) (by C-G formula based on SCr  of 2.31 mg/dL (H)). Liver Function Tests: Recent Labs  Lab 06/06/21 2110 06/07/21 0740 06/08/21 0707 06/09/21 0505 06/10/21 0510  AST 20 23  --   --   --   ALT 12 12  --   --   --   ALKPHOS 89 84  --   --   --   BILITOT 0.6 0.7  --   --   --   PROT 6.6 6.9  --   --   --   ALBUMIN 3.7 3.8 3.9 3.4* 3.2*   No results for input(s): LIPASE, AMYLASE in the last 168 hours. No results for input(s): AMMONIA in the last 168 hours. Coagulation Profile: Recent Labs  Lab 06/06/21 2110  INR 1.0   Cardiac Enzymes: No results for input(s): CKTOTAL, CKMB, CKMBINDEX, TROPONINI in the last 168 hours. BNP (last 3 results) No results for input(s): PROBNP in the last 8760 hours. HbA1C: Recent Labs    06/07/21 0740  HGBA1C 5.7*   CBG: Recent Labs  Lab 06/06/21 2110 06/09/21 1015  GLUCAP 114* 133*   Lipid Profile: Recent Labs    06/07/21 0740  CHOL 214*  HDL 70  LDLCALC 128*  TRIG 82  CHOLHDL 3.1   Thyroid Function Tests: No results for input(s): TSH, T4TOTAL, FREET4, T3FREE, THYROIDAB in the last 72 hours. Anemia Panel: No results for input(s): VITAMINB12, FOLATE, FERRITIN, TIBC, IRON, RETICCTPCT in the last 72 hours. Sepsis Labs: No results for input(s): PROCALCITON, LATICACIDVEN in the last 168 hours.  Recent Results (from the past 240 hour(s))  Resp Panel by RT-PCR (Flu A&B, Covid) Nasopharyngeal Swab     Status: None   Collection Time: 06/06/21  9:57 PM   Specimen: Nasopharyngeal Swab; Nasopharyngeal(NP) swabs in vial transport medium  Result Value Ref Range Status   SARS Coronavirus 2 by RT PCR NEGATIVE NEGATIVE Final    Comment: (NOTE) SARS-CoV-2 target nucleic acids are NOT DETECTED.  The SARS-CoV-2 RNA is generally detectable in upper respiratory specimens during the acute phase of infection. The lowest concentration of SARS-CoV-2 viral copies this assay can detect is 138 copies/mL. A negative result does not preclude SARS-Cov-2 infection and should not be used  as the sole basis for treatment or other patient management decisions. A negative result may occur with  improper specimen collection/handling, submission of specimen other than nasopharyngeal swab, presence of viral mutation(s) within the areas targeted by this assay, and inadequate number of viral copies(<138 copies/mL). A negative result must be combined with clinical observations, patient  history, and epidemiological information. The expected result is Negative.  Fact Sheet for Patients:  EntrepreneurPulse.com.au  Fact Sheet for Healthcare Providers:  IncredibleEmployment.be  This test is no t yet approved or cleared by the [place] FDA and  has been authorized for detection and/or diagnosis of SARS-CoV-2 by FDA under an Emergency Use Authorization (EUA). This EUA will remain  in effect (meaning this test can be used) for the duration of the COVID-19 declaration under Section 564(b)(1) of the Act, 21 U.S.C.section 360bbb-3(b)(1), unless the authorization is terminated  or revoked sooner.       Influenza A by PCR NEGATIVE NEGATIVE Final   Influenza B by PCR NEGATIVE NEGATIVE Final    Comment: (NOTE) The Xpert Xpress SARS-CoV-2/FLU/RSV plus assay is intended as an aid in the diagnosis of influenza from Nasopharyngeal swab specimens and should not be used as a sole basis for treatment. Nasal washings and aspirates are unacceptable for Xpert Xpress SARS-CoV-2/FLU/RSV testing.  Fact Sheet for Patients: EntrepreneurPulse.com.au  Fact Sheet for Healthcare Providers: IncredibleEmployment.be  This test is not yet approved or cleared by the [place] FDA and has been authorized for detection and/or diagnosis of SARS-CoV-2 by FDA under an Emergency Use Authorization (EUA). This EUA will remain in effect (meaning this test can be used) for the duration of the COVID-19 declaration under Section 564(b)(1)  of the Act, 21 U.S.C. section 360bbb-3(b)(1), unless the authorization is terminated or revoked.  Performed at [facility], [address] [place], [place] [address]          Radiology Studies: CT HEAD WO CONTRAST (5MM)  Result Date: 06/09/2021 CLINICAL DATA:  Mental status change, unknown cause EXAM: CT HEAD WITHOUT CONTRAST TECHNIQUE: Contiguous axial images were obtained from the base of the skull through the vertex without intravenous contrast. COMPARISON:  06/06/2021 FINDINGS: Brain: No evidence of acute infarction, hemorrhage, hydrocephalus, extra-axial collection or mass lesion/mass effect. Scattered low-density changes within the periventricular and subcortical white matter compatible with chronic microvascular ischemic change. Mild diffuse cerebral volume loss. Vascular: Atherosclerotic calcifications involving the large vessels of the skull base. No unexpected hyperdense vessel. Skull: Normal. Negative for fracture or focal lesion. Sinuses/Orbits: No acute finding. Other: None. IMPRESSION: No acute intracranial findings.  Stable exam. Electronically Signed   By: Davina Poke D.O.   On: 06/09/2021 11:37   DG Chest Port 1 View  Result Date: 06/09/2021 CLINICAL DATA:  Syncope EXAM: PORTABLE CHEST 1 VIEW COMPARISON:  06/06/2021 FINDINGS: Chronic cardiomegaly. Chronic aortic atherosclerosis with tortuosity. Chronic elevation of the left hemidiaphragm. No pulmonary edema. No infiltrate or active collapse. No effusion. IMPRESSION: Chronic cardiomegaly and aortic atherosclerotic disease. Chronic elevation of the left hemidiaphragm. No change since the previous study. Electronically Signed   By: Nelson Chimes M.D.   On: 06/09/2021 11:08        Scheduled Meds:  atorvastatin  40 mg Oral q1800   carvedilol  25 mg Oral BID WC   cloNIDine  0.1 mg Oral TID   clopidogrel  75 mg Oral Daily   famotidine  10 mg Oral Daily   heparin  5,000 Units Subcutaneous Q8H   hydrALAZINE   10 mg Intravenous Once   hydrochlorothiazide  12.5 mg Oral Daily   irbesartan  300 mg Oral Daily   levothyroxine  75 mcg Oral Q0600   Continuous Infusions:   LOS: 1 day    Time spent: 33 mins     Wyvonnia Dusky, MD [facility] Pager [phone]  If 7PM-7AM, please contact night-coverage 06/10/2021, 7:31 AM

## 2021-06-10 NOTE — H&P (View-Only) (Signed)
[facility] Vascular Consult Note  MRN : [id number]  Betty Nielsen is a 75 y.o. ([birth year]) female who presents with chief complaint of  Chief Complaint  Patient presents with   Dizziness   Numbness  .  History of Present Illness: I am asked to see the patient by Dr. 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  She is on multiple antihypertensives, her blood pressure still generally runs [id number] systolic or more.  She has a long history of high blood pressure but it has worsened over the past couple of years.  She has a previous history of an open aneurysm repair some years ago in [place].  A renal artery duplex was performed here at the hospital which showed elevated velocities in the left renal artery consistent with a hemodynamically significant stenosis.  With this finding, we are consulted for further evaluation and treatment  Current Facility-Administered Medications  Medication Dose Route Frequency Provider Last Rate Last Admin   acetaminophen (TYLENOL) tablet 650 mg  650 mg Oral Q6H PRN Wendee Beavers T, MD   650 mg at 06/08/21 1613   Or   acetaminophen (TYLENOL) suppository 650 mg  650 mg Rectal Q6H PRN Mercy Riding, MD       ALPRAZolam Duanne Moron) tablet 0.25 mg  0.25 mg Oral BID PRN Wyvonnia Dusky, MD       alum & mag hydroxide-simeth (MAALOX/MYLANTA) 200-200-20 MG/5ML suspension 30 mL  30 mL Oral Q8H PRN Wendee Beavers T, MD   30 mL at 06/08/21 2204   atorvastatin (LIPITOR) tablet 40 mg  40 mg Oral q1800 Wendee Beavers T, MD   40 mg at 06/09/21 1728   cloNIDine (CATAPRES - Dosed in mg/24 hr) patch 0.2 mg  0.2 mg Transdermal Weekly White, Delicia, NP       clopidogrel (PLAVIX) tablet 75 mg  75 mg Oral Daily Gonfa, Taye T, MD   75 mg at 06/10/21 1003   famotidine (PEPCID) tablet 10 mg  10 mg Oral Daily Wendee Beavers T, MD   10 mg at 06/10/21 1002   heparin injection 5,000 Units  5,000 Units Subcutaneous Q8H Wendee Beavers T, MD   5,000 Units at 06/10/21 [id number]   hydrochlorothiazide (MICROZIDE) capsule 12.5 mg  12.5 mg Oral Daily White, Delicia, NP   [id number] mg at 06/10/21 1004   irbesartan (AVAPRO) tablet 300 mg  300 mg Oral Daily Wendee Beavers T, MD   300 mg at 06/10/21 1003   labetalol (NORMODYNE) tablet 200 mg  200 mg Oral BID White, Delicia, NP   [id number] mg at 06/10/21 1511   levothyroxine (SYNTHROID) tablet 75 mcg  75 mcg Oral Q0600 Wendee Beavers T, MD   75 mcg at 06/10/21 0638   ondansetron (ZOFRAN) injection 4 mg  4 mg Intravenous Q6H PRN Mercy Riding, MD   4 mg at 06/09/21 2117    Past Medical History:  Diagnosis Date   Arthrosis of knee    Chronic kidney disease    Hyperlipidemia    Hypertension    Loss of hearing    Metabolic syndrome    Plantar fasciitis    Renal insufficiency     Past Surgical History:  Procedure Laterality Date   ABDOMINAL AORTIC ANEURYSM REPAIR     ABDOMINAL HYSTERECTOMY     APPENDECTOMY     CORONARY STENT INTERVENTION N/A 10/26/2016   Procedure: Coronary Stent Intervention;  Surgeon: Yolonda Kida, MD;  Location: [facility];  Service: Cardiovascular;  Laterality: N/A;   HERNIA REPAIR     INNER EAR SURGERY     pt not sure of type   LEFT HEART CATH AND CORONARY ANGIOGRAPHY N/A 10/26/2016   Procedure: Left Heart Cath and Coronary Angiography;  Surgeon: Teodoro Spray, MD;  Location: [facility];  Service: Cardiovascular;  Laterality: N/A;   THYROIDECTOMY       Social History   Tobacco Use   Smoking status: Former    Packs/day: 0.50    Years: 20.00    Pack years: 10.00    Types: Cigarettes    Quit date: 2000    Years since quitting: 22.7   Smokeless tobacco: Never   Tobacco comments:    smoking cessation materials not required  Vaping Use   Vaping Use: Never used  Substance Use Topics   Alcohol use: No    Alcohol/week: 0.0 standard drinks   Drug use: No     Family History  Problem Relation Age of Onset   Healthy Mother    Cerebral  aneurysm Father   No bleeding disorders or clotting disorders  Allergies  Allergen Reactions   Ace Inhibitors Cough   Ciprofloxacin Other (See Comments)    unknown   Citalopram     hyponatremia   Hydrocodone-Acetaminophen Other (See Comments)    unknown   Norvasc [Amlodipine Besylate]     Pruritus    Pantoprazole Sodium Other (See Comments)    Cramps   Pravastatin Other (See Comments)    Stomach cramps   Sulfa Antibiotics     unknown   Clonidine Other (See Comments)    Per patient told by allergist that would be best to stop but nephrologist recommended her to stay on medication      REVIEW OF SYSTEMS (Negative unless checked)  Constitutional: '[]'$ Weight loss  '[]'$ Fever  '[]'$ Chills Cardiac: '[]'$ Chest pain   '[]'$ Chest pressure   '[]'$ Palpitations   '[]'$ Shortness of breath when laying flat   '[]'$ Shortness of breath at rest   '[]'$ Shortness of breath with exertion. Vascular:  '[]'$ Pain in legs with walking   '[]'$ Pain in legs at rest   '[]'$ Pain in legs when laying flat   '[]'$ Claudication   '[]'$ Pain in feet when walking  '[]'$ Pain in feet at rest  '[]'$ Pain in feet when laying flat   '[]'$ History of DVT   '[]'$ Phlebitis   '[]'$ Swelling in legs   '[]'$ Varicose veins   '[]'$ Non-healing ulcers Pulmonary:   '[]'$ Uses home oxygen   '[]'$ Productive cough   '[]'$ Hemoptysis   '[]'$ Wheeze  '[]'$ COPD   '[]'$ Asthma Neurologic:  '[]'$ Dizziness  '[]'$ Blackouts   '[]'$ Seizures   '[]'$ History of stroke   '[]'$ History of TIA  '[]'$ Aphasia   '[]'$ Temporary blindness   '[]'$ Dysphagia   '[]'$ Weakness or numbness in arms   '[]'$ Weakness or numbness in legs Musculoskeletal:  '[x]'$ Arthritis   '[]'$ Joint swelling   '[]'$ Joint pain   '[]'$ Low back pain Hematologic:  '[]'$ Easy bruising  '[]'$ Easy bleeding   '[]'$ Hypercoagulable state   '[]'$ Anemic  '[]'$ Hepatitis Gastrointestinal:  '[]'$ Blood in stool   '[]'$ Vomiting blood  '[x]'$ Gastroesophageal reflux/heartburn   '[]'$ Difficulty swallowing. Genitourinary:  '[x]'$ Chronic kidney disease   '[]'$ Difficult urination  '[]'$ Frequent urination  '[]'$ Burning with urination   '[]'$ Blood in urine Skin:  '[]'$ Rashes    '[]'$ Ulcers   '[]'$ Wounds Psychological:  '[]'$ History of anxiety   '[]'$  History of major depression.  Physical Examination  Vitals:   06/10/21 0500 06/10/21 0711 06/10/21 1128 06/10/21 1511  BP: (!) 203/83 (!) 200/78 112/83 Marland Kitchen)  189/86  Pulse:  (!) 55 69 64  Resp:  17 20   Temp:  98.5 F (36.9 C) 97.8 F (36.6 C) 97.9 F (36.6 C)  TempSrc:  Oral Oral Oral  SpO2:  99% 99%   Weight:      Height:       Body mass index is 29.69 kg/m. Gen:  WD/WN, NAD Head: [place]/AT, No temporalis wasting.  Ear/Nose/Throat: Hearing grossly intact, nares w/o erythema or drainage, oropharynx w/o Erythema/Exudate Eyes: Sclera non-icteric, conjunctiva clear Neck: Trachea midline.  No JVD.  Pulmonary:  Good air movement, respirations not labored, equal bilaterally.  Cardiac: RRR, normal S1, S2. Vascular:  Vessel Right Left  Radial Palpable Palpable                                   Gastrointestinal: soft, non-tender/non-distended. No guarding/reflex.  Musculoskeletal: M/S 5/5 throughout.  Extremities without ischemic changes.  No deformity or atrophy. No edema. Neurologic: Sensation grossly intact in extremities.  Symmetrical.  Speech is fluent. Motor exam as listed above. Psychiatric: Judgment intact, Mood & affect appropriate for pt's clinical situation. Dermatologic: No rashes or ulcers noted.  No cellulitis or open wounds.      CBC Lab Results  Component Value Date   WBC 6.7 06/10/2021   HGB 9.5 (L) 06/10/2021   HCT 29.1 (L) 06/10/2021   MCV 91.8 06/10/2021   PLT 229 06/10/2021    BMET    Component Value Date/Time   NA 132 (L) 06/10/2021 0510   NA 133 (A) 09/26/2018 0000   NA 129 (L) 04/05/2012 0237   K 4.6 06/10/2021 0510   K 3.3 (L) 04/05/2012 0237   CL 99 06/10/2021 0510   CL 94 (L) 04/05/2012 0237   CO2 26 06/10/2021 0510   CO2 25 04/05/2012 0237   GLUCOSE 96 06/10/2021 0510   GLUCOSE 93 04/05/2012 0237   BUN 30 (H) 06/10/2021 0510   BUN 15 09/26/2018 0000   BUN 9  04/05/2012 0237   CREATININE 2.31 (H) 06/10/2021 0510   CREATININE 1.82 (H) 06/18/2020 1357   CALCIUM 8.9 06/10/2021 0510   CALCIUM 8.6 04/05/2012 0237   GFRNONAA 22 (L) 06/10/2021 0510   GFRNONAA 27 (L) 06/18/2020 1357   GFRAA 31 (L) 06/18/2020 1357   Estimated Creatinine Clearance: 22.1 mL/min (A) (by C-G formula based on SCr of 2.31 mg/dL (H)).  COAG Lab Results  Component Value Date   INR 1.0 06/06/2021   INR 1.02 10/24/2017   INR 1.00 10/25/2016    Radiology CT HEAD WO CONTRAST (5MM)  Result Date: 06/09/2021 CLINICAL DATA:  Mental status change, unknown cause EXAM: CT HEAD WITHOUT CONTRAST TECHNIQUE: Contiguous axial images were obtained from the base of the skull through the vertex without intravenous contrast. COMPARISON:  06/06/2021 FINDINGS: Brain: No evidence of acute infarction, hemorrhage, hydrocephalus, extra-axial collection or mass lesion/mass effect. Scattered low-density changes within the periventricular and subcortical white matter compatible with chronic microvascular ischemic change. Mild diffuse cerebral volume loss. Vascular: Atherosclerotic calcifications involving the large vessels of the skull base. No unexpected hyperdense vessel. Skull: Normal. Negative for fracture or focal lesion. Sinuses/Orbits: No acute finding. Other: None. IMPRESSION: No acute intracranial findings.  Stable exam. Electronically Signed   By: Davina Poke D.O.   On: 06/09/2021 11:37   CT HEAD WO CONTRAST  Result Date: 06/06/2021 CLINICAL DATA:  Transient ischemic attack EXAM: CT HEAD WITHOUT CONTRAST TECHNIQUE:  Contiguous axial images were obtained from the base of the skull through the vertex without intravenous contrast. COMPARISON:  None. FINDINGS: Brain: There is no mass, hemorrhage or extra-axial collection. The size and configuration of the ventricles and extra-axial CSF spaces are normal. The brain parenchyma is normal, without acute or chronic infarction. Vascular: No abnormal  hyperdensity of the major intracranial arteries or dural venous sinuses. No intracranial atherosclerosis. Skull: The visualized skull base, calvarium and extracranial soft tissues are normal. Sinuses/Orbits: No fluid levels or advanced mucosal thickening of the visualized paranasal sinuses. No mastoid or middle ear effusion. The orbits are normal. IMPRESSION: Normal head CT. Electronically Signed   By: Ulyses Jarred M.D.   On: 06/06/2021 21:47   MR ANGIO HEAD WO CONTRAST  Result Date: 06/07/2021 CLINICAL DATA:  Left arm weakness and facial tingling EXAM: MRA HEAD WITHOUT CONTRAST TECHNIQUE: Angiographic images of the Circle of Willis were acquired using MRA technique without intravenous contrast. COMPARISON:  No pertinent prior exam. FINDINGS: POSTERIOR CIRCULATION: --Vertebral arteries: Normal --Inferior cerebellar arteries: Normal. --Basilar artery: Normal. --Superior cerebellar arteries: Normal. --Posterior cerebral arteries: Normal. ANTERIOR CIRCULATION: --Intracranial internal carotid arteries: Normal. --Anterior cerebral arteries (ACA): Normal. --Middle cerebral arteries (MCA): Normal. ANATOMIC VARIANTS: Fetal origin of the right PCA. IMPRESSION: Normal intracranial MRA. Electronically Signed   By: Ulyses Jarred M.D.   On: 06/07/2021 01:31   MR BRAIN WO CONTRAST  Result Date: 06/07/2021 CLINICAL DATA:  Acute neurologic deficit EXAM: MRI HEAD WITHOUT CONTRAST TECHNIQUE: Multiplanar, multiecho pulse sequences of the brain and surrounding structures were obtained without intravenous contrast. COMPARISON:  None. FINDINGS: Brain: No acute infarct, mass effect or extra-axial collection. No acute or chronic hemorrhage. There is multifocal hyperintense T2-weighted signal within the white matter. Parenchymal volume and CSF spaces are normal. A partially empty sella is incidentally noted. Vascular: Major flow voids are preserved. Skull and upper cervical spine: Normal calvarium and skull base. Visualized upper  cervical spine and soft tissues are normal. Sinuses/Orbits:No paranasal sinus fluid levels or advanced mucosal thickening. No mastoid or middle ear effusion. Normal orbits. IMPRESSION: 1. No acute intracranial abnormality. 2. Findings of chronic small vessel ischemia. Electronically Signed   By: Ulyses Jarred M.D.   On: 06/07/2021 01:35   US Carotid Bilateral  Result Date: 06/07/2021 CLINICAL DATA:  Acute neurologic deficit. EXAM: BILATERAL CAROTID DUPLEX ULTRASOUND TECHNIQUE: Pearline Cables scale imaging, color Doppler and duplex ultrasound were performed of bilateral carotid and vertebral arteries in the neck. COMPARISON:  10/26/2019 FINDINGS: Criteria: Quantification of carotid stenosis is based on velocity parameters that correlate the residual internal carotid diameter with NASCET-based stenosis levels, using the diameter of the distal internal carotid lumen as the denominator for stenosis measurement. The following velocity measurements were obtained: RIGHT ICA: 122/41 cm/sec CCA: [id number] cm/sec SYSTOLIC ICA/CCA RATIO: 1.7 ECA: 67 cm/sec LEFT ICA: 109/29 cm/sec CCA: [id number] cm/sec SYSTOLIC ICA/CCA RATIO: 1.9 ECA: 71 cm/sec RIGHT CAROTID ARTERY: Mild atheromatous plaque at the bifurcation. RIGHT VERTEBRAL ARTERY: Patent with antegrade flow and normal waveform LEFT CAROTID ARTERY:  Mild atheromatous plaque at the bifurcation LEFT VERTEBRAL ARTERY: Patent with antegrade flow and normal waveform IMPRESSION: 1. Mild calcified plaque at the carotid bifurcations. No evidence of hemodynamically significant stenosis. 2. Antegrade flow in the vertebral arteries. Electronically Signed   By: Jorje Guild M.D.   On: 06/07/2021 06:44   DG Chest Port 1 View  Result Date: 06/09/2021 CLINICAL DATA:  Syncope EXAM: PORTABLE CHEST 1 VIEW COMPARISON:  06/06/2021 FINDINGS: Chronic cardiomegaly. Chronic aortic  atherosclerosis with tortuosity. Chronic elevation of the left hemidiaphragm. No pulmonary edema. No infiltrate or active  collapse. No effusion. IMPRESSION: Chronic cardiomegaly and aortic atherosclerotic disease. Chronic elevation of the left hemidiaphragm. No change since the previous study. Electronically Signed   By: Nelson Chimes M.D.   On: 06/09/2021 11:08   DG Chest Portable 1 View  Result Date: 06/06/2021 CLINICAL DATA:  Shortness of breath. Evaluate for edema. Dizziness for 3 weeks. Left arm tingling and numbness started a. EXAM: PORTABLE CHEST 1 VIEW COMPARISON:  10/25/2019 FINDINGS: Cardiac enlargement. No vascular congestion, edema, or consolidation. Lungs are clear. Segmental elevation of the left hemidiaphragm similar to prior study. No pleural effusions. No pneumothorax. Calcified and tortuous aorta. IMPRESSION: Cardiac enlargement.  No evidence of active pulmonary disease. Electronically Signed   By: Lucienne Capers M.D.   On: 06/06/2021 22:07   US RENAL ARTERY DUPLEX COMPLETE  Result Date: 06/10/2021 CLINICAL DATA:  Hypertension EXAM: RENAL/URINARY TRACT ULTRASOUND RENAL DUPLEX DOPPLER ULTRASOUND COMPARISON:  CTA December 2019 FINDINGS: Right Kidney: Length: 12 cm. Echogenicity within normal limits. No hydronephrosis. A couple of renal cysts are again seen with the largest measuring up to 5 cm. Left Kidney: Length: 13.2 cm. Echogenicity within normal limits. No hydronephrosis. Numerous cysts are again seen, with the largest measuring up to 6.5 cm. Bladder:  Within normal limits. 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 IMPRESSION: 1. By Doppler velocity criteria alone, there are no findings on this exam to suggest significant renal artery stenosis. 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 Findings on ultrasound therefore are felt to be under representative. Further evaluation could be done with either repeat CTA, or referral to a service such as interventional radiology for consideration of catheter directed renal angiography and potential treatment. 2. 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 Recommend follow-up every 12 months and vascular consultation. This recommendation follows ACR consensus guidelines: White Paper of the ACR Incidental Findings Committee II on Vascular Findings. J Am Coll Radiol 2013; 10:789-794. 3. Bilateral simple renal cysts, grossly stable. Electronically Signed   By: Albin Felling M.D.   On: 06/10/2021 10:48   ECHOCARDIOGRAM COMPLETE BUBBLE STUDY  Result Date: 06/07/2021    ECHOCARDIOGRAM REPORT   Patient Name:   SHANTALE ERICSSON Colt Date of Exam: 06/07/2021 Medical Rec #:  [id number]       Height:       65.0 in Accession #:    [id number]      Weight:       187.0 lb Date of Birth:  1945-10-14        BSA:          1.922 m Patient Age:    79 years  BP:           212/185 mmHg Patient Gender: F               HR:           62 bpm. Exam Location:  [facility] Procedure: 2D Echo, Color Doppler and Cardiac Doppler Indications:     TIA (transient ischemic attack) 435.9 / G45.9  History:         Patient has prior history of Echocardiogram examinations. Risk                  Factors:Hypertension. HLD.  Sonographer:     Alyse Low Roar Referring Phys:  [id number] Rhetta Mura Diagnosing Phys: Ida Rogue MD IMPRESSIONS  1.  Left ventricular ejection fraction, by estimation, is 60 to 65%. The left ventricle has normal function. The left ventricle has no regional wall motion abnormalities. There is mild left ventricular hypertrophy. Left ventricular diastolic parameters are consistent with Grade I diastolic dysfunction (impaired relaxation). The average left ventricular global longitudinal strain is -20.3 %. The global longitudinal strain is normal.  2. Right ventricular systolic function is normal. The right ventricular size is normal.  3. The mitral valve is normal in structure. No evidence of mitral valve regurgitation. No evidence of mitral stenosis.  4. The aortic valve was not well visualized. Aortic valve regurgitation is not visualized. Mild aortic valve sclerosis is present, with no evidence of aortic valve stenosis.  5. The inferior vena cava is normal in size with greater than 50% respiratory variability, suggesting right atrial pressure of 3 mmHg. FINDINGS  Left Ventricle: Left ventricular ejection fraction, by estimation, is 60 to 65%. The left ventricle has normal function. The left ventricle has no regional wall motion abnormalities. The average left ventricular global longitudinal strain is -20.3 %. The global longitudinal strain is normal. The left ventricular internal cavity size was normal in size. There is mild left ventricular hypertrophy. Left ventricular diastolic parameters are consistent with Grade I diastolic dysfunction (impaired relaxation). Right Ventricle: The right ventricular size is normal. No increase in right ventricular wall thickness. Right ventricular systolic function is normal. Left Atrium: Left atrial size was normal in size. Right Atrium: Right atrial size was normal in size. Pericardium: There is no evidence of pericardial effusion. Mitral Valve: The mitral valve is normal in structure. No evidence of mitral valve regurgitation. No evidence of mitral valve stenosis. Tricuspid Valve: The  tricuspid valve is normal in structure. Tricuspid valve regurgitation is not demonstrated. No evidence of tricuspid stenosis. Aortic Valve: The aortic valve was not well visualized. Aortic valve regurgitation is not visualized. Mild aortic valve sclerosis is present, with no evidence of aortic valve stenosis. Aortic valve peak gradient measures 11.1 mmHg. Pulmonic Valve: The pulmonic valve was normal in structure. Pulmonic valve regurgitation is trivial. No evidence of pulmonic stenosis. Aorta: The aortic root is normal in size and structure. Venous: The inferior vena cava is normal in size with greater than 50% respiratory variability, suggesting right atrial pressure of 3 mmHg. IAS/Shunts: No atrial level shunt detected by color flow Doppler.  LEFT VENTRICLE PLAX 2D LVIDd:         4.50 cm  Diastology LVIDs:         3.20 cm  LV e' medial:    5.33 cm/s LV PW:         1.10 cm  LV E/e' medial:  16.7 LV IVS:        1.30 cm  LV e' lateral:   5.33 cm/s LVOT diam:     2.00 cm  LV E/e' lateral: 16.7 LVOT Area:     3.14 cm                         2D Longitudinal Strain                         2D Strain GLS Avg:     -20.3 % RIGHT VENTRICLE RV Basal diam:  3.00 cm RV Mid diam:    2.50 cm RV S prime:     14.00 cm/s TAPSE (M-mode): 2.4 cm LEFT ATRIUM             Index       RIGHT ATRIUM           Index LA diam:        3.90 cm 2.03 cm/m  RA Area:     15.40 cm LA Vol (A2C):   55.9 ml 29.08 ml/m RA Volume:   38.90 ml  20.23 ml/m LA Vol (A4C):   53.7 ml 27.93 ml/m LA Biplane Vol: 56.7 ml 29.49 ml/m  AORTIC VALVE                PULMONIC VALVE AV Area (Vmax): 2.57 cm    PV Vmax:          1.05 m/s AV Vmax:        166.50 cm/s PV Peak grad:     4.4 mmHg AV Peak Grad:   11.1 mmHg   PR End Diast Vel: 4.84 msec LVOT Vmax:      136.00 cm/s RVOT Peak grad:   4 mmHg  AORTA Ao Root diam: 2.80 cm MITRAL VALVE MV Area (PHT): 3.39 cm     SHUNTS MV Decel Time: 224 msec     Systemic Diam: 2.00 cm MV E velocity: 89.20 cm/s MV A velocity:  123.00 cm/s MV E/A ratio:  0.73 MV A Prime:    11.0 cm/s Ida Rogue MD Electronically signed by Ida Rogue MD Signature Date/Time: 06/07/2021/6:32:37 PM    Final       Assessment/Plan 1.  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  I have discussed the pathophysiology and natural history of renal artery stenosis and why it causes high blood pressure.  I discussed that intervention does not generally cure the high blood pressure, but it does generally markedly improve it. 2.  Renal artery stenosis.  Duplex suggest significant left renal artery stenosis in the patient with severely elevated blood pressure of over 200 on multiple medications.  In individuals who are requiring multiple medications and have suboptimal blood pressure control, renal artery intervention is generally appropriate.  I discussed the risk and benefits of procedure.  Tomorrow schedule is fairly busy, but I will do my best to get her done tomorrow 3.  Chronic kidney disease.  May be some component of ischemic nephropathy and her very elevated blood pressures are certainly exacerbating the situation.  Would recommend some hydration several hours before the procedure and 6 hours after the procedure.  We will limit contrast as much as possible. 4.  Abdominal aortic aneurysm repair.  This was done over a decade ago and sounds like an open repair.  This should not impact our ability to perform angiography of the renal arteries tomorrow.   Leotis Pain, MD  06/10/2021 4:04 PM  This note was created with Dragon medical transcription system.  Any error is purely unintentional

## 2021-06-10 NOTE — Progress Notes (Signed)
Physical Therapy Treatment Patient Details Name: Betty Nielsen MRN: [id number] DOB: [birth year] Today's Date: 06/10/2021   History of Present Illness Pt is a 75 y/o F admitted on 06/06/21 with c/o LUE & L side numbness. MRI reveals no acute intracranial abnormality. 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  Pt able to maneuver around the room with good safe technique.  Pt then transferred to ambulating downt th ehall in which the nurse made therapist aware that pt was doing well and no longer needed therapy.  Pt agreed with this change in current POC and was happy that she is doing so well.  Patient is at baseline, all education completed, and time is given to address all questions/concerns. No additional skilled PT services needed at this time, PT signing off. PT recommends daily ambulation ad lib or with nursing staff as needed to prevent deconditioning.     Recommendations for follow up therapy are one component of a multi-disciplinary discharge planning process, led by the attending physician.  Recommendations may be updated based on patient status, additional functional criteria and insurance authorization.  Follow Up Recommendations  Supervision for mobility/OOB;Outpatient PT     Equipment Recommendations  None recommended by PT    Recommendations for Other Services       Precautions / Restrictions Precautions Precautions: Fall Restrictions Weight Bearing Restrictions: No     Mobility  Bed Mobility                    Transfers                    Ambulation/Gait                 Stairs             Wheelchair Mobility    Modified Rankin (Stroke Patients Only)       Balance Overall balance assessment: Modified Independent Sitting-balance support: No upper extremity supported;Feet supported Sitting  balance-Leahy Scale: Normal Sitting balance - Comments: Good sitting balance at EOB reaching within BOS   Standing balance support: No upper extremity supported;During functional activity Standing balance-Leahy Scale: Normal Standing balance comment: Pt maneuvers well with good independence.  Pt notes some instability due to the pain in her knees and back from arthritis.                            Cognition Arousal/Alertness: Awake/alert Behavior During Therapy: WFL for tasks assessed/performed Overall Cognitive Status: Within Functional Limits for tasks assessed                                 General Comments: Pt alert and oriented to self, place, and situation.      Exercises      General Comments        Pertinent Vitals/Pain Pain Assessment: No/denies pain Pain Intervention(s): Monitored during session    Home Living                      Prior Function            PT Goals (current goals can now be found in the care plan section) Acute Rehab PT Goals Patient Stated Goal: feel  better, go home PT Goal Formulation: With patient/family Time For Goal Achievement: 06/21/21 Potential to Achieve Goals: Good Progress towards PT goals: Goals met/education completed, patient discharged from PT;Progressing toward goals    Frequency    Other (Comment) (d/c at this time per request of pt.)      PT Plan Discharge plan needs to be updated    Co-evaluation              AM-PAC PT "6 Clicks" Mobility   Outcome Measure  Help needed turning from your back to your side while in a flat bed without using bedrails?: None Help needed moving from lying on your back to sitting on the side of a flat bed without using bedrails?: None Help needed moving to and from a bed to a chair (including a wheelchair)?: None Help needed standing up from a chair using your arms (e.g., wheelchair or bedside chair)?: None Help needed to walk in hospital  room?: None Help needed climbing 3-5 steps with a railing? : None 6 Click Score: 24    End of Session Equipment Utilized During Treatment: Gait belt Activity Tolerance:  (limited by nausea) Patient left: in bed;with bed alarm set Nurse Communication: Mobility status PT Visit Diagnosis: Unsteadiness on feet (R26.81)     Time: 8316-7425 PT Time Calculation (min) (ACUTE ONLY): 18 min  Charges:  $Gait Training: 8-22 mins                     Gwenlyn Saran, PT, DPT 06/10/21, 11:26 PM    Christie Nottingham 06/10/2021, 11:25 PM

## 2021-06-10 NOTE — Progress Notes (Signed)
[facility] Cardiology  Patient Description: Betty Nielsen is a 75 year old female with PMH significant for CAD, NSTEMI s/p PCI/DES to mid RCA (2018), h/o acute ischemic CVA, poorly controlled hypertension, hyperlipidemia, CKD stage IIIb, anemia, thyroid disease, PVD and obesity who was admitted to [facility] due to strokelike symptoms and poorly controlled hypertension thus cardiology was consulted.     SUBJECTIVE: The patient reports to be feeling well on today and states that she would like to be discharged. She denies having any cardiac symptoms at this time. The patient states that her blood pressure is "always around 200" at home and that she has been taking irbesartan, labetalol and clonidine. She states that she was prescribed nifedipine by her nephrologist, but did not want to take this medication; therefore, she did not pick it up from the pharmacy. She states that the clonidine causes her to having "congestion" and thus she would eventually like to be weaned off this medication also.   OBJECTIVE: The patient appears fairly well on today, however, she continues to have a systolic blood pressure in the 200's. She is asymptomatic and all other vss are normal. Bedside telemetry reveals NSR with HR in the 60's. It may be that this patient has lived with this high blood pressure so long that a blood pressure within the normal range causes her to have complications with dizziness. The patient is sitting in the chair at this time, resting comfortably and is in NAD. The patient was thoroughly educated on her current condition and was informed that she does have the right to leave AMA; however, it would be beneficial for her to stay because of the uncontrolled blood pressure. She verbalized understanding of all information.   Vitals:   06/10/21 0030 06/10/21 0420 06/10/21 0500 06/10/21 0711  BP: (!) 180/96 (!) 205/91 (!) 203/83 (!) 200/78  Pulse: 61 62  (!) 55  Resp: '16 16  17  '$ Temp: 98.6 F (37 C) 98.5 F (36.9 C)   98.5 F (36.9 C)  TempSrc: Oral   Oral  SpO2: 99% 99%  99%  Weight:      Height:        No intake or output data in the 24 hours ending 06/10/21 0803    PHYSICAL EXAM  General: Well developed, well nourished, in no acute distress HEENT:  Normocephalic and atramatic. PERRL Neck:   No JVD.  Lungs: Clear bilaterally to auscultation, chest expansion symmetrical, normal effort of breathing, no wheezes, rales or rhonchi Heart: HRRR . Normal S1 and S2 without gallops or murmurs.  Abdomen: Bowel sounds are positive, abdomen soft and non-tender  Msk:  Normal strength and tone for age. Extremities: No clubbing, cyanosis or edema.   Neuro: Alert and oriented X 3. Psych:  Good affect, responds appropriately   LABS: Basic Metabolic Panel: Recent Labs    06/09/21 0505 06/10/21 0510  NA 133* 132*  K 4.2 4.6  CL 99 99  CO2 24 26  GLUCOSE 92 96  BUN 30* 30*  CREATININE 2.55* 2.31*  CALCIUM 9.2 8.9  MG 2.1 2.1  PHOS 4.4 3.8   Liver Function Tests: Recent Labs    06/09/21 0505 06/10/21 0510  ALBUMIN 3.4* 3.2*   No results for input(s): LIPASE, AMYLASE in the last 72 hours. CBC: Recent Labs    06/09/21 0505 06/10/21 0510  WBC 6.7 6.7  HGB 10.4* 9.5*  HCT 30.5* 29.1*  MCV 93.0 91.8  PLT 242 229   Cardiac Enzymes: No results for input(s):  CKTOTAL, CKMB, CKMBINDEX, TROPONINI in the last 72 hours. BNP: Invalid input(s): POCBNP D-Dimer: No results for input(s): DDIMER in the last 72 hours. Hemoglobin A1C: No results for input(s): HGBA1C in the last 72 hours. Fasting Lipid Panel: No results for input(s): CHOL, HDL, LDLCALC, TRIG, CHOLHDL, LDLDIRECT in the last 72 hours. Thyroid Function Tests: No results for input(s): TSH, T4TOTAL, T3FREE, THYROIDAB in the last 72 hours.  Invalid input(s): FREET3 Anemia Panel: No results for input(s): VITAMINB12, FOLATE, FERRITIN, TIBC, IRON, RETICCTPCT in the last 72 hours.  CT HEAD WO CONTRAST (5MM)  Result Date:  06/09/2021 CLINICAL DATA:  Mental status change, unknown cause EXAM: CT HEAD WITHOUT CONTRAST TECHNIQUE: Contiguous axial images were obtained from the base of the skull through the vertex without intravenous contrast. COMPARISON:  06/06/2021 FINDINGS: Brain: No evidence of acute infarction, hemorrhage, hydrocephalus, extra-axial collection or mass lesion/mass effect. Scattered low-density changes within the periventricular and subcortical [place] matter compatible with chronic microvascular ischemic change. Mild diffuse cerebral volume loss. Vascular: Atherosclerotic calcifications involving the large vessels of the skull base. No unexpected hyperdense vessel. Skull: Normal. Negative for fracture or focal lesion. Sinuses/Orbits: No acute finding. Other: None. IMPRESSION: No acute intracranial findings.  Stable exam. Electronically Signed   By: Davina Poke D.O.   On: 06/09/2021 11:37   DG Chest Port 1 View  Result Date: 06/09/2021 CLINICAL DATA:  Syncope EXAM: PORTABLE CHEST 1 VIEW COMPARISON:  06/06/2021 FINDINGS: Chronic cardiomegaly. Chronic aortic atherosclerosis with tortuosity. Chronic elevation of the left hemidiaphragm. No pulmonary edema. No infiltrate or active collapse. No effusion. IMPRESSION: Chronic cardiomegaly and aortic atherosclerotic disease. Chronic elevation of the left hemidiaphragm. No change since the previous study. Electronically Signed   By: Nelson Chimes M.D.   On: 06/09/2021 11:08       TELEMETRY: NSR  ASSESSMENT AND PLAN:  Principal Problem:   CVA (cerebral vascular accident) ([place]) Active Problems:   Hypertension, benign   Hypothyroidism, postsurgical   CKD (chronic kidney disease), stage III ([facility])   Hyperlipidemia   Chronic hyponatremia   Anemia in chronic kidney disease   Left upper extremity numbness   Left facial numbness   Elevated serum creatinine   Accelerated hypertension    Syncope, possibly due to rapid decrease in blood pressure, in addition to  patient not being accustomed to a blood pressure that is within normal range  ECG was unremarkable.   High-sensitivity troponin was unremarkable. Recommend continuous cardiac monitoring until discharge. Recommend monitoring blood pressure closely per protocol. Recommend ambulating with assistance and changing positions slowly.   Poorly controlled blood pressure, patient is hypertensive at this time with a systolic blood pressure of above 200 Discontinue coreg at this time.  Start labetalol '200mg'$  twice daily per at home dose. Start clonidine patch. Discontinue oral clonidine.  Continue hydrochlorothiazide 12.5 mg once daily. Proceed with renal ultrasound to rule out renal artery stenosis. Low-sodium, heart healthy diet recommended at this time. Strict intake and output.    History of CVA with current strokelike symptoms Recommend careful blood pressure control as the patient's history of CVA indicates ischemic etiology. Recommend continuing Plavix for antiplatelet therapy.   Hyperlipidemia, reasonably controlled Recommend continuing atorvastatin therapy.   CKD stage IIIb, fairly stable Recommend trending CMP. Proceed with renal artery ultrasound.   [place], ACNPC-AG  06/10/2021 8:03 AM

## 2021-06-10 NOTE — Consult Note (Signed)
[facility] Vascular Consult Note  MRN : [id number]  Betty Nielsen is a 75 y.o. (April 20, 1946) female who presents with chief complaint of  Chief Complaint  Patient presents with   Dizziness   Numbness  .  History of Present Illness: I am asked to see the patient by Dr. 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  She is on multiple antihypertensives, her blood pressure still generally runs [id number] systolic or more.  She has a long history of high blood pressure but it has worsened over the past couple of years.  She has a previous history of an open aneurysm repair some years ago in [place].  A renal artery duplex was performed here at the hospital which showed elevated velocities in the left renal artery consistent with a hemodynamically significant stenosis.  With this finding, we are consulted for further evaluation and treatment  Current Facility-Administered Medications  Medication Dose Route Frequency Provider Last Rate Last Admin   acetaminophen (TYLENOL) tablet 650 mg  650 mg Oral Q6H PRN Wendee Beavers T, MD   650 mg at 06/08/21 1613   Or   acetaminophen (TYLENOL) suppository 650 mg  650 mg Rectal Q6H PRN Mercy Riding, MD       ALPRAZolam Duanne Moron) tablet 0.25 mg  0.25 mg Oral BID PRN Wyvonnia Dusky, MD       alum & mag hydroxide-simeth (MAALOX/MYLANTA) 200-200-20 MG/5ML suspension 30 mL  30 mL Oral Q8H PRN Wendee Beavers T, MD   30 mL at 06/08/21 2204   atorvastatin (LIPITOR) tablet 40 mg  40 mg Oral q1800 Wendee Beavers T, MD   40 mg at 06/09/21 1728   cloNIDine (CATAPRES - Dosed in mg/24 hr) patch 0.2 mg  0.2 mg Transdermal Weekly White, Delicia, NP       clopidogrel (PLAVIX) tablet 75 mg  75 mg Oral Daily Gonfa, Taye T, MD   75 mg at 06/10/21 1003   famotidine (PEPCID) tablet 10 mg  10 mg Oral Daily Wendee Beavers T, MD   10 mg at 06/10/21 1002   heparin injection 5,000 Units  5,000 Units Subcutaneous Q8H Wendee Beavers T, MD   5,000 Units at 06/10/21 [id number]   hydrochlorothiazide (MICROZIDE) capsule 12.5 mg  12.5 mg Oral Daily White, Delicia, NP   [id number] mg at 06/10/21 1004   irbesartan (AVAPRO) tablet 300 mg  300 mg Oral Daily Wendee Beavers T, MD   300 mg at 06/10/21 1003   labetalol (NORMODYNE) tablet 200 mg  200 mg Oral BID White, Delicia, NP   [id number] mg at 06/10/21 1511   levothyroxine (SYNTHROID) tablet 75 mcg  75 mcg Oral Q0600 Wendee Beavers T, MD   75 mcg at 06/10/21 0638   ondansetron (ZOFRAN) injection 4 mg  4 mg Intravenous Q6H PRN Mercy Riding, MD   4 mg at 06/09/21 2117    Past Medical History:  Diagnosis Date   Arthrosis of knee    Chronic kidney disease    Hyperlipidemia    Hypertension    Loss of hearing    Metabolic syndrome    Plantar fasciitis    Renal insufficiency     Past Surgical History:  Procedure Laterality Date   ABDOMINAL AORTIC ANEURYSM REPAIR     ABDOMINAL HYSTERECTOMY     APPENDECTOMY     CORONARY STENT INTERVENTION N/A 10/26/2016   Procedure: Coronary Stent Intervention;  Surgeon: Yolonda Kida, MD;  Location: [facility];  Service: Cardiovascular;  Laterality: N/A;   HERNIA REPAIR     INNER EAR SURGERY     pt not sure of type   LEFT HEART CATH AND CORONARY ANGIOGRAPHY N/A 10/26/2016   Procedure: Left Heart Cath and Coronary Angiography;  Surgeon: Teodoro Spray, MD;  Location: [facility];  Service: Cardiovascular;  Laterality: N/A;   THYROIDECTOMY       Social History   Tobacco Use   Smoking status: Former    Packs/day: 0.50    Years: 20.00    Pack years: 10.00    Types: Cigarettes    Quit date: 2000    Years since quitting: 22.7   Smokeless tobacco: Never   Tobacco comments:    smoking cessation materials not required  Vaping Use   Vaping Use: Never used  Substance Use Topics   Alcohol use: No    Alcohol/week: 0.0 standard drinks   Drug use: No     Family History  Problem Relation Age of Onset   Healthy Mother    Cerebral  aneurysm Father   No bleeding disorders or clotting disorders  Allergies  Allergen Reactions   Ace Inhibitors Cough   Ciprofloxacin Other (See Comments)    unknown   Citalopram     hyponatremia   Hydrocodone-Acetaminophen Other (See Comments)    unknown   Norvasc [Amlodipine Besylate]     Pruritus    Pantoprazole Sodium Other (See Comments)    Cramps   Pravastatin Other (See Comments)    Stomach cramps   Sulfa Antibiotics     unknown   Clonidine Other (See Comments)    Per patient told by allergist that would be best to stop but nephrologist recommended her to stay on medication      REVIEW OF SYSTEMS (Negative unless checked)  Constitutional: '[]'$ Weight loss  '[]'$ Fever  '[]'$ Chills Cardiac: '[]'$ Chest pain   '[]'$ Chest pressure   '[]'$ Palpitations   '[]'$ Shortness of breath when laying flat   '[]'$ Shortness of breath at rest   '[]'$ Shortness of breath with exertion. Vascular:  '[]'$ Pain in legs with walking   '[]'$ Pain in legs at rest   '[]'$ Pain in legs when laying flat   '[]'$ Claudication   '[]'$ Pain in feet when walking  '[]'$ Pain in feet at rest  '[]'$ Pain in feet when laying flat   '[]'$ History of DVT   '[]'$ Phlebitis   '[]'$ Swelling in legs   '[]'$ Varicose veins   '[]'$ Non-healing ulcers Pulmonary:   '[]'$ Uses home oxygen   '[]'$ Productive cough   '[]'$ Hemoptysis   '[]'$ Wheeze  '[]'$ COPD   '[]'$ Asthma Neurologic:  '[]'$ Dizziness  '[]'$ Blackouts   '[]'$ Seizures   '[]'$ History of stroke   '[]'$ History of TIA  '[]'$ Aphasia   '[]'$ Temporary blindness   '[]'$ Dysphagia   '[]'$ Weakness or numbness in arms   '[]'$ Weakness or numbness in legs Musculoskeletal:  '[x]'$ Arthritis   '[]'$ Joint swelling   '[]'$ Joint pain   '[]'$ Low back pain Hematologic:  '[]'$ Easy bruising  '[]'$ Easy bleeding   '[]'$ Hypercoagulable state   '[]'$ Anemic  '[]'$ Hepatitis Gastrointestinal:  '[]'$ Blood in stool   '[]'$ Vomiting blood  '[x]'$ Gastroesophageal reflux/heartburn   '[]'$ Difficulty swallowing. Genitourinary:  '[x]'$ Chronic kidney disease   '[]'$ Difficult urination  '[]'$ Frequent urination  '[]'$ Burning with urination   '[]'$ Blood in urine Skin:  '[]'$ Rashes    '[]'$ Ulcers   '[]'$ Wounds Psychological:  '[]'$ History of anxiety   '[]'$  History of major depression.  Physical Examination  Vitals:   06/10/21 0500 06/10/21 0711 06/10/21 1128 06/10/21 1511  BP: (!) 203/83 (!) 200/78 112/83 Marland Kitchen)  189/86  Pulse:  (!) 55 69 64  Resp:  17 20   Temp:  98.5 F (36.9 C) 97.8 F (36.6 C) 97.9 F (36.6 C)  TempSrc:  Oral Oral Oral  SpO2:  99% 99%   Weight:      Height:       Body mass index is 29.69 kg/m. Gen:  WD/WN, NAD Head: [place]/AT, No temporalis wasting.  Ear/Nose/Throat: Hearing grossly intact, nares w/o erythema or drainage, oropharynx w/o Erythema/Exudate Eyes: Sclera non-icteric, conjunctiva clear Neck: Trachea midline.  No JVD.  Pulmonary:  Good air movement, respirations not labored, equal bilaterally.  Cardiac: RRR, normal S1, S2. Vascular:  Vessel Right Left  Radial Palpable Palpable                                   Gastrointestinal: soft, non-tender/non-distended. No guarding/reflex.  Musculoskeletal: M/S 5/5 throughout.  Extremities without ischemic changes.  No deformity or atrophy. No edema. Neurologic: Sensation grossly intact in extremities.  Symmetrical.  Speech is fluent. Motor exam as listed above. Psychiatric: Judgment intact, Mood & affect appropriate for pt's clinical situation. Dermatologic: No rashes or ulcers noted.  No cellulitis or open wounds.      CBC Lab Results  Component Value Date   WBC 6.7 06/10/2021   HGB 9.5 (L) 06/10/2021   HCT 29.1 (L) 06/10/2021   MCV 91.8 06/10/2021   PLT 229 06/10/2021    BMET    Component Value Date/Time   NA 132 (L) 06/10/2021 0510   NA 133 (A) 09/26/2018 0000   NA 129 (L) 04/05/2012 0237   K 4.6 06/10/2021 0510   K 3.3 (L) 04/05/2012 0237   CL 99 06/10/2021 0510   CL 94 (L) 04/05/2012 0237   CO2 26 06/10/2021 0510   CO2 25 04/05/2012 0237   GLUCOSE 96 06/10/2021 0510   GLUCOSE 93 04/05/2012 0237   BUN 30 (H) 06/10/2021 0510   BUN 15 09/26/2018 0000   BUN 9  04/05/2012 0237   CREATININE 2.31 (H) 06/10/2021 0510   CREATININE 1.82 (H) 06/18/2020 1357   CALCIUM 8.9 06/10/2021 0510   CALCIUM 8.6 04/05/2012 0237   GFRNONAA 22 (L) 06/10/2021 0510   GFRNONAA 27 (L) 06/18/2020 1357   GFRAA 31 (L) 06/18/2020 1357   Estimated Creatinine Clearance: 22.1 mL/min (A) (by C-G formula based on SCr of 2.31 mg/dL (H)).  COAG Lab Results  Component Value Date   INR 1.0 06/06/2021   INR 1.02 10/24/2017   INR 1.00 10/25/2016    Radiology CT HEAD WO CONTRAST (5MM)  Result Date: 06/09/2021 CLINICAL DATA:  Mental status change, unknown cause EXAM: CT HEAD WITHOUT CONTRAST TECHNIQUE: Contiguous axial images were obtained from the base of the skull through the vertex without intravenous contrast. COMPARISON:  06/06/2021 FINDINGS: Brain: No evidence of acute infarction, hemorrhage, hydrocephalus, extra-axial collection or mass lesion/mass effect. Scattered low-density changes within the periventricular and subcortical white matter compatible with chronic microvascular ischemic change. Mild diffuse cerebral volume loss. Vascular: Atherosclerotic calcifications involving the large vessels of the skull base. No unexpected hyperdense vessel. Skull: Normal. Negative for fracture or focal lesion. Sinuses/Orbits: No acute finding. Other: None. IMPRESSION: No acute intracranial findings.  Stable exam. Electronically Signed   By: Davina Poke D.O.   On: 06/09/2021 11:37   CT HEAD WO CONTRAST  Result Date: 06/06/2021 CLINICAL DATA:  Transient ischemic attack EXAM: CT HEAD WITHOUT CONTRAST TECHNIQUE:  Contiguous axial images were obtained from the base of the skull through the vertex without intravenous contrast. COMPARISON:  None. FINDINGS: Brain: There is no mass, hemorrhage or extra-axial collection. The size and configuration of the ventricles and extra-axial CSF spaces are normal. The brain parenchyma is normal, without acute or chronic infarction. Vascular: No abnormal  hyperdensity of the major intracranial arteries or dural venous sinuses. No intracranial atherosclerosis. Skull: The visualized skull base, calvarium and extracranial soft tissues are normal. Sinuses/Orbits: No fluid levels or advanced mucosal thickening of the visualized paranasal sinuses. No mastoid or middle ear effusion. The orbits are normal. IMPRESSION: Normal head CT. Electronically Signed   By: Ulyses Jarred M.D.   On: 06/06/2021 21:47   MR ANGIO HEAD WO CONTRAST  Result Date: 06/07/2021 CLINICAL DATA:  Left arm weakness and facial tingling EXAM: MRA HEAD WITHOUT CONTRAST TECHNIQUE: Angiographic images of the Circle of Willis were acquired using MRA technique without intravenous contrast. COMPARISON:  No pertinent prior exam. FINDINGS: POSTERIOR CIRCULATION: --Vertebral arteries: Normal --Inferior cerebellar arteries: Normal. --Basilar artery: Normal. --Superior cerebellar arteries: Normal. --Posterior cerebral arteries: Normal. ANTERIOR CIRCULATION: --Intracranial internal carotid arteries: Normal. --Anterior cerebral arteries (ACA): Normal. --Middle cerebral arteries (MCA): Normal. ANATOMIC VARIANTS: Fetal origin of the right PCA. IMPRESSION: Normal intracranial MRA. Electronically Signed   By: Ulyses Jarred M.D.   On: 06/07/2021 01:31   MR BRAIN WO CONTRAST  Result Date: 06/07/2021 CLINICAL DATA:  Acute neurologic deficit EXAM: MRI HEAD WITHOUT CONTRAST TECHNIQUE: Multiplanar, multiecho pulse sequences of the brain and surrounding structures were obtained without intravenous contrast. COMPARISON:  None. FINDINGS: Brain: No acute infarct, mass effect or extra-axial collection. No acute or chronic hemorrhage. There is multifocal hyperintense T2-weighted signal within the white matter. Parenchymal volume and CSF spaces are normal. A partially empty sella is incidentally noted. Vascular: Major flow voids are preserved. Skull and upper cervical spine: Normal calvarium and skull base. Visualized upper  cervical spine and soft tissues are normal. Sinuses/Orbits:No paranasal sinus fluid levels or advanced mucosal thickening. No mastoid or middle ear effusion. Normal orbits. IMPRESSION: 1. No acute intracranial abnormality. 2. Findings of chronic small vessel ischemia. Electronically Signed   By: Ulyses Jarred M.D.   On: 06/07/2021 01:35   US Carotid Bilateral  Result Date: 06/07/2021 CLINICAL DATA:  Acute neurologic deficit. EXAM: BILATERAL CAROTID DUPLEX ULTRASOUND TECHNIQUE: Pearline Cables scale imaging, color Doppler and duplex ultrasound were performed of bilateral carotid and vertebral arteries in the neck. COMPARISON:  10/26/2019 FINDINGS: Criteria: Quantification of carotid stenosis is based on velocity parameters that correlate the residual internal carotid diameter with NASCET-based stenosis levels, using the diameter of the distal internal carotid lumen as the denominator for stenosis measurement. The following velocity measurements were obtained: RIGHT ICA: 122/41 cm/sec CCA: [id number] cm/sec SYSTOLIC ICA/CCA RATIO: 1.7 ECA: 67 cm/sec LEFT ICA: 109/29 cm/sec CCA: [id number] cm/sec SYSTOLIC ICA/CCA RATIO: 1.9 ECA: 71 cm/sec RIGHT CAROTID ARTERY: Mild atheromatous plaque at the bifurcation. RIGHT VERTEBRAL ARTERY: Patent with antegrade flow and normal waveform LEFT CAROTID ARTERY:  Mild atheromatous plaque at the bifurcation LEFT VERTEBRAL ARTERY: Patent with antegrade flow and normal waveform IMPRESSION: 1. Mild calcified plaque at the carotid bifurcations. No evidence of hemodynamically significant stenosis. 2. Antegrade flow in the vertebral arteries. Electronically Signed   By: Jorje Guild M.D.   On: 06/07/2021 06:44   DG Chest Port 1 View  Result Date: 06/09/2021 CLINICAL DATA:  Syncope EXAM: PORTABLE CHEST 1 VIEW COMPARISON:  06/06/2021 FINDINGS: Chronic cardiomegaly. Chronic aortic  atherosclerosis with tortuosity. Chronic elevation of the left hemidiaphragm. No pulmonary edema. No infiltrate or active  collapse. No effusion. IMPRESSION: Chronic cardiomegaly and aortic atherosclerotic disease. Chronic elevation of the left hemidiaphragm. No change since the previous study. Electronically Signed   By: Nelson Chimes M.D.   On: 06/09/2021 11:08   DG Chest Portable 1 View  Result Date: 06/06/2021 CLINICAL DATA:  Shortness of breath. Evaluate for edema. Dizziness for 3 weeks. Left arm tingling and numbness started a. EXAM: PORTABLE CHEST 1 VIEW COMPARISON:  10/25/2019 FINDINGS: Cardiac enlargement. No vascular congestion, edema, or consolidation. Lungs are clear. Segmental elevation of the left hemidiaphragm similar to prior study. No pleural effusions. No pneumothorax. Calcified and tortuous aorta. IMPRESSION: Cardiac enlargement.  No evidence of active pulmonary disease. Electronically Signed   By: Lucienne Capers M.D.   On: 06/06/2021 22:07   US RENAL ARTERY DUPLEX COMPLETE  Result Date: 06/10/2021 CLINICAL DATA:  Hypertension EXAM: RENAL/URINARY TRACT ULTRASOUND RENAL DUPLEX DOPPLER ULTRASOUND COMPARISON:  CTA December 2019 FINDINGS: Right Kidney: Length: 12 cm. Echogenicity within normal limits. No hydronephrosis. A couple of renal cysts are again seen with the largest measuring up to 5 cm. Left Kidney: Length: 13.2 cm. Echogenicity within normal limits. No hydronephrosis. Numerous cysts are again seen, with the largest measuring up to 6.5 cm. Bladder:  Within normal limits. 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 IMPRESSION: 1. By Doppler velocity criteria alone, there are no findings on this exam to suggest significant renal artery stenosis. 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 Findings on ultrasound therefore are felt to be under representative. Further evaluation could be done with either repeat CTA, or referral to a service such as interventional radiology for consideration of catheter directed renal angiography and potential treatment. 2. 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 Recommend follow-up every 12 months and vascular consultation. This recommendation follows ACR consensus guidelines: White Paper of the ACR Incidental Findings Committee II on Vascular Findings. J Am Coll Radiol 2013; 10:789-794. 3. Bilateral simple renal cysts, grossly stable. Electronically Signed   By: Albin Felling M.D.   On: 06/10/2021 10:48   ECHOCARDIOGRAM COMPLETE BUBBLE STUDY  Result Date: 06/07/2021    ECHOCARDIOGRAM REPORT   Patient Name:   Betty Nielsen Date of Exam: 06/07/2021 Medical Rec #:  [id number]       Height:       65.0 in Accession #:    [id number]      Weight:       187.0 lb Date of Birth:  Jul 19, 1946        BSA:          1.922 m Patient Age:    76 years  BP:           212/185 mmHg Patient Gender: F               HR:           62 bpm. Exam Location:  [facility] Procedure: 2D Echo, Color Doppler and Cardiac Doppler Indications:     TIA (transient ischemic attack) 435.9 / G45.9  History:         Patient has prior history of Echocardiogram examinations. Risk                  Factors:Hypertension. HLD.  Sonographer:     Alyse Low Roar Referring Phys:  [id number] Rhetta Mura Diagnosing Phys: Ida Rogue MD IMPRESSIONS  1.  Left ventricular ejection fraction, by estimation, is 60 to 65%. The left ventricle has normal function. The left ventricle has no regional wall motion abnormalities. There is mild left ventricular hypertrophy. Left ventricular diastolic parameters are consistent with Grade I diastolic dysfunction (impaired relaxation). The average left ventricular global longitudinal strain is -20.3 %. The global longitudinal strain is normal.  2. Right ventricular systolic function is normal. The right ventricular size is normal.  3. The mitral valve is normal in structure. No evidence of mitral valve regurgitation. No evidence of mitral stenosis.  4. The aortic valve was not well visualized. Aortic valve regurgitation is not visualized. Mild aortic valve sclerosis is present, with no evidence of aortic valve stenosis.  5. The inferior vena cava is normal in size with greater than 50% respiratory variability, suggesting right atrial pressure of 3 mmHg. FINDINGS  Left Ventricle: Left ventricular ejection fraction, by estimation, is 60 to 65%. The left ventricle has normal function. The left ventricle has no regional wall motion abnormalities. The average left ventricular global longitudinal strain is -20.3 %. The global longitudinal strain is normal. The left ventricular internal cavity size was normal in size. There is mild left ventricular hypertrophy. Left ventricular diastolic parameters are consistent with Grade I diastolic dysfunction (impaired relaxation). Right Ventricle: The right ventricular size is normal. No increase in right ventricular wall thickness. Right ventricular systolic function is normal. Left Atrium: Left atrial size was normal in size. Right Atrium: Right atrial size was normal in size. Pericardium: There is no evidence of pericardial effusion. Mitral Valve: The mitral valve is normal in structure. No evidence of mitral valve regurgitation. No evidence of mitral valve stenosis. Tricuspid Valve: The  tricuspid valve is normal in structure. Tricuspid valve regurgitation is not demonstrated. No evidence of tricuspid stenosis. Aortic Valve: The aortic valve was not well visualized. Aortic valve regurgitation is not visualized. Mild aortic valve sclerosis is present, with no evidence of aortic valve stenosis. Aortic valve peak gradient measures 11.1 mmHg. Pulmonic Valve: The pulmonic valve was normal in structure. Pulmonic valve regurgitation is trivial. No evidence of pulmonic stenosis. Aorta: The aortic root is normal in size and structure. Venous: The inferior vena cava is normal in size with greater than 50% respiratory variability, suggesting right atrial pressure of 3 mmHg. IAS/Shunts: No atrial level shunt detected by color flow Doppler.  LEFT VENTRICLE PLAX 2D LVIDd:         4.50 cm  Diastology LVIDs:         3.20 cm  LV e' medial:    5.33 cm/s LV PW:         1.10 cm  LV E/e' medial:  16.7 LV IVS:        1.30 cm  LV e' lateral:   5.33 cm/s LVOT diam:     2.00 cm  LV E/e' lateral: 16.7 LVOT Area:     3.14 cm                         2D Longitudinal Strain                         2D Strain GLS Avg:     -20.3 % RIGHT VENTRICLE RV Basal diam:  3.00 cm RV Mid diam:    2.50 cm RV S prime:     14.00 cm/s TAPSE (M-mode): 2.4 cm LEFT ATRIUM             Index       RIGHT ATRIUM           Index LA diam:        3.90 cm 2.03 cm/m  RA Area:     15.40 cm LA Vol (A2C):   55.9 ml 29.08 ml/m RA Volume:   38.90 ml  20.23 ml/m LA Vol (A4C):   53.7 ml 27.93 ml/m LA Biplane Vol: 56.7 ml 29.49 ml/m  AORTIC VALVE                PULMONIC VALVE AV Area (Vmax): 2.57 cm    PV Vmax:          1.05 m/s AV Vmax:        166.50 cm/s PV Peak grad:     4.4 mmHg AV Peak Grad:   11.1 mmHg   PR End Diast Vel: 4.84 msec LVOT Vmax:      136.00 cm/s RVOT Peak grad:   4 mmHg  AORTA Ao Root diam: 2.80 cm MITRAL VALVE MV Area (PHT): 3.39 cm     SHUNTS MV Decel Time: 224 msec     Systemic Diam: 2.00 cm MV E velocity: 89.20 cm/s MV A velocity:  123.00 cm/s MV E/A ratio:  0.73 MV A Prime:    11.0 cm/s Ida Rogue MD Electronically signed by Ida Rogue MD Signature Date/Time: 06/07/2021/6:32:37 PM    Final       Assessment/Plan 1.  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  I have discussed the pathophysiology and natural history of renal artery stenosis and why it causes high blood pressure.  I discussed that intervention does not generally cure the high blood pressure, but it does generally markedly improve it. 2.  Renal artery stenosis.  Duplex suggest significant left renal artery stenosis in the patient with severely elevated blood pressure of over 200 on multiple medications.  In individuals who are requiring multiple medications and have suboptimal blood pressure control, renal artery intervention is generally appropriate.  I discussed the risk and benefits of procedure.  Tomorrow schedule is fairly busy, but I will do my best to get her done tomorrow 3.  Chronic kidney disease.  May be some component of ischemic nephropathy and her very elevated blood pressures are certainly exacerbating the situation.  Would recommend some hydration several hours before the procedure and 6 hours after the procedure.  We will limit contrast as much as possible. 4.  Abdominal aortic aneurysm repair.  This was done over a decade ago and sounds like an open repair.  This should not impact our ability to perform angiography of the renal arteries tomorrow.   Leotis Pain, MD  06/10/2021 4:04 PM  This note was created with Dragon medical transcription system.  Any error is purely unintentional

## 2021-06-10 NOTE — Progress Notes (Signed)
PRN labetalol given for SBP 202; pt is frustrated, and visibly agitated.  Patient expressed concerns and frustrations about being in the hospital longer than she feels necessary.  Pt reports she "feels that all these tests could have been done yesterday"    Patient denies any weakness or tingling, denies any pain.  Patient just states "my nerves are agitated".  Pt reports she wants to speak to physician who she saw in the ED.

## 2021-06-11 ENCOUNTER — Ambulatory Visit: Payer: Medicare Other

## 2021-06-11 ENCOUNTER — Encounter
Admission: EM | Disposition: A | Discharge status: Home / Self Care | Payer: Self-pay | Source: Home / Self Care | Attending: Internal Medicine

## 2021-06-11 ENCOUNTER — Inpatient Hospital Stay: Payer: Medicare Other

## 2021-06-11 ENCOUNTER — Encounter: Payer: Self-pay | Admitting: Vascular Surgery

## 2021-06-11 DIAGNOSIS — I716 Thoracoabdominal aortic aneurysm, without rupture: Secondary | ICD-10-CM

## 2021-06-11 DIAGNOSIS — I151 Hypertension secondary to other renal disorders: Secondary | ICD-10-CM | POA: Diagnosis not present

## 2021-06-11 DIAGNOSIS — R55 Syncope and collapse: Secondary | ICD-10-CM | POA: Diagnosis not present

## 2021-06-11 DIAGNOSIS — I150 Renovascular hypertension: Secondary | ICD-10-CM

## 2021-06-11 DIAGNOSIS — N2889 Other specified disorders of kidney and ureter: Secondary | ICD-10-CM | POA: Diagnosis not present

## 2021-06-11 DIAGNOSIS — I701 Atherosclerosis of renal artery: Secondary | ICD-10-CM

## 2021-06-11 HISTORY — PX: RENAL ANGIOGRAPHY: CATH118260

## 2021-06-11 LAB — CBC
HCT: 28.6 % — ABNORMAL LOW (ref 36.0–46.0)
Hemoglobin: 9.6 g/dL — ABNORMAL LOW (ref 12.0–15.0)
MCH: 30.5 pg (ref 26.0–34.0)
MCHC: 33.6 g/dL (ref 30.0–36.0)
MCV: 90.8 fL (ref 80.0–100.0)
Platelets: 230 10*3/uL (ref 150–400)
RBC: 3.15 MIL/uL — ABNORMAL LOW (ref 3.87–5.11)
RDW: 12.3 % (ref 11.5–15.5)
WBC: 7.2 10*3/uL (ref 4.0–10.5)
nRBC: 0 % (ref 0.0–0.2)

## 2021-06-11 LAB — BASIC METABOLIC PANEL
Anion gap: 9 (ref 5–15)
BUN: 31 mg/dL — ABNORMAL HIGH (ref 8–23)
CO2: 24 mmol/L (ref 22–32)
Calcium: 9.2 mg/dL (ref 8.9–10.3)
Chloride: 97 mmol/L — ABNORMAL LOW (ref 98–111)
Creatinine, Ser: 2.16 mg/dL — ABNORMAL HIGH (ref 0.44–1.00)
GFR, Estimated: 23 mL/min — ABNORMAL LOW (ref >60)
Glucose, Bld: 92 mg/dL (ref 70–99)
Potassium: 4.3 mmol/L (ref 3.5–5.1)
Sodium: 130 mmol/L — ABNORMAL LOW (ref 135–145)

## 2021-06-11 IMAGING — CT CT ABD-PELV W/O CM
2 of 4 series · 15 of 46 positions shown, 17 images · non-contrast
Comparison: CT [DATE], [DATE]

CLINICAL DATA: 75-year-old female with a history of possible
retroperitoneal hematoma

EXAM:
CT ABDOMEN AND PELVIS WITHOUT CONTRAST
TECHNIQUE: Multidetector CT imaging of the abdomen and pelvis was performed
following the standard protocol without IV contrast.

[Series 2: routine abd/pel wo · axial · 0.98mm/px · z∈[-634,-99]mm · 12 of 117 slices shown, 14 images]
[im 5/117  soft-tissue]
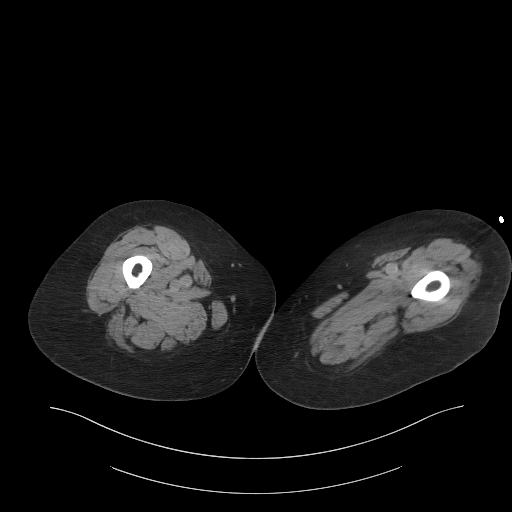
[im 5/117  bone]
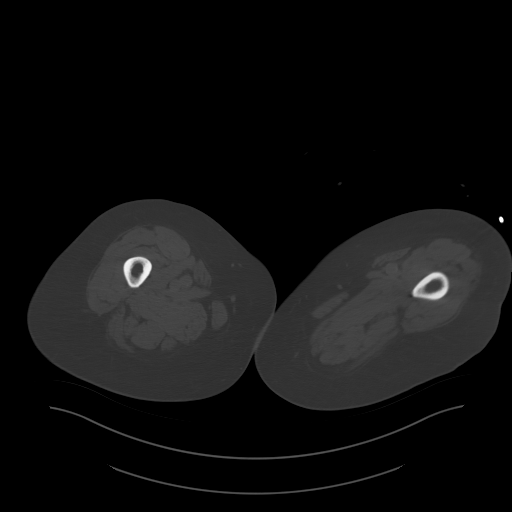
[im 15/117  soft-tissue]
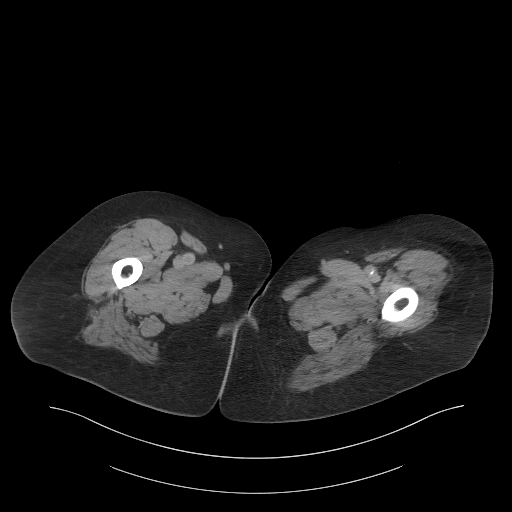
[im 25/117  soft-tissue]
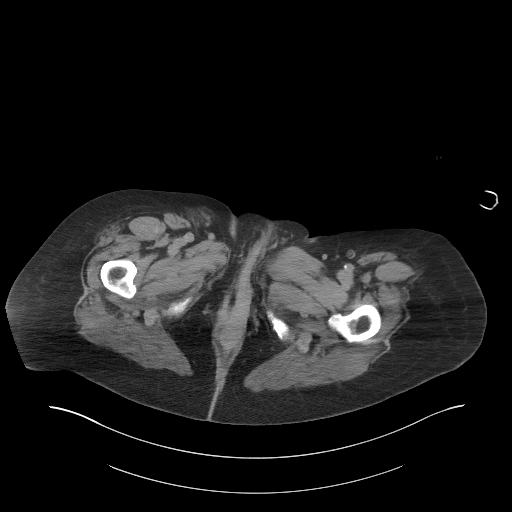
[im 34/117  soft-tissue]
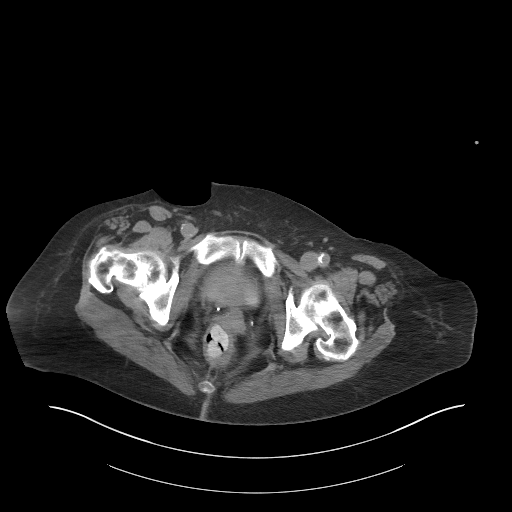
[im 44/117  soft-tissue]
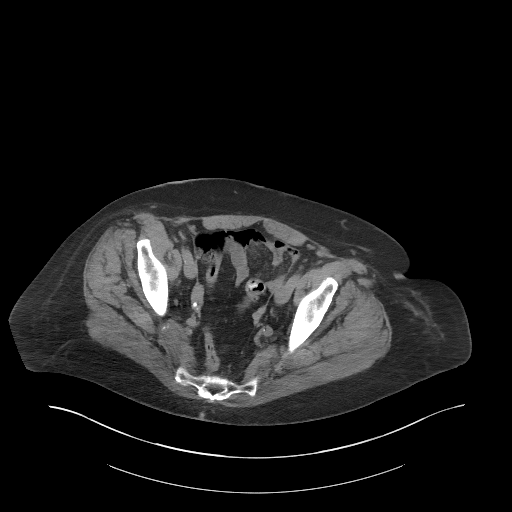
[im 54/117  soft-tissue]
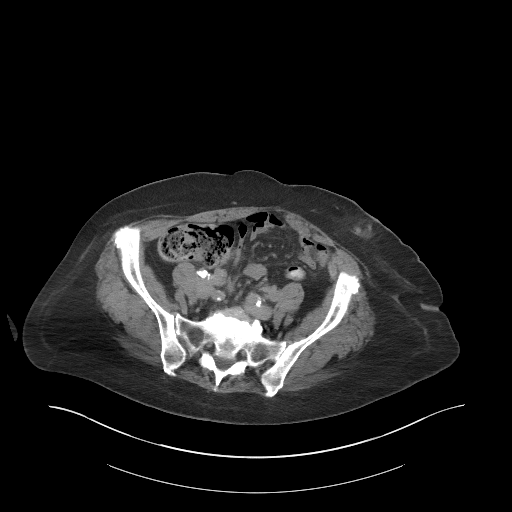
[im 63/117  soft-tissue]
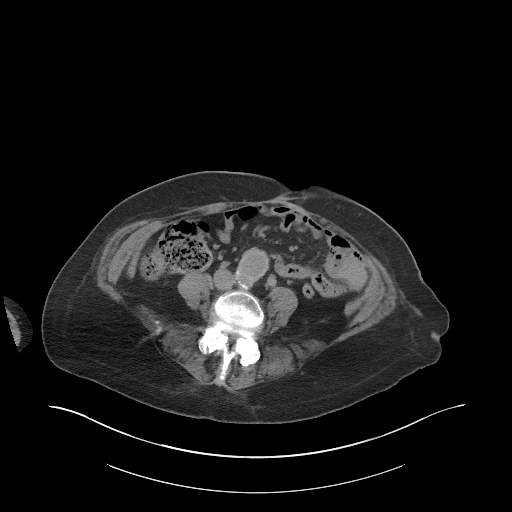
[im 73/117  soft-tissue]
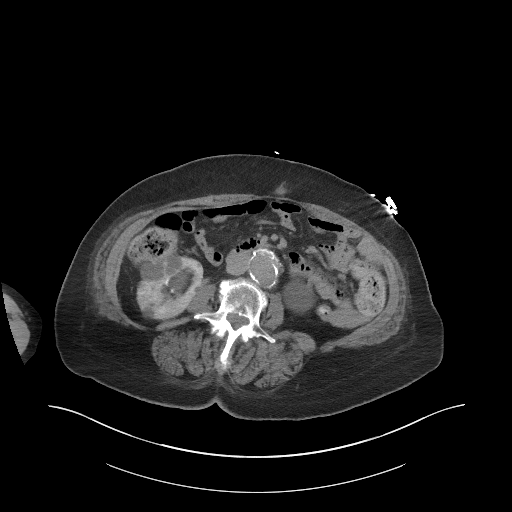
[im 83/117  soft-tissue]
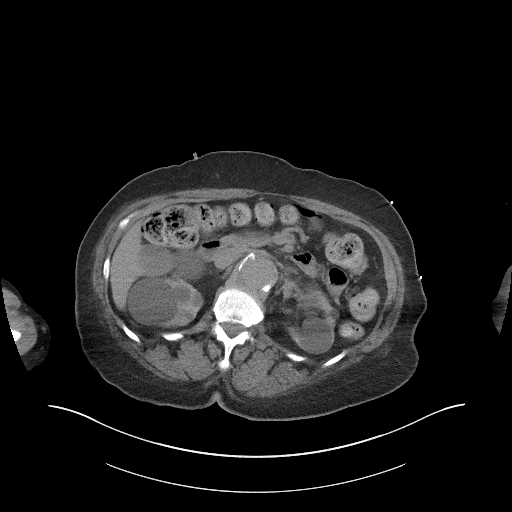
[im 83/117  bone]
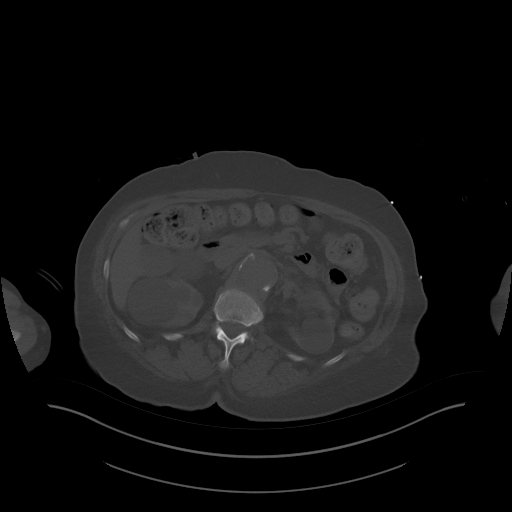
[im 92/117  soft-tissue]
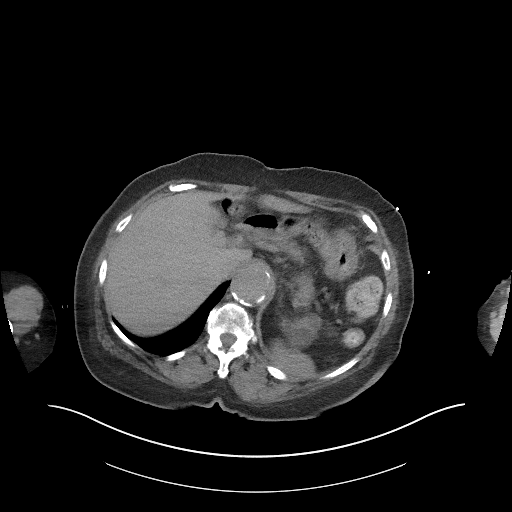
[im 102/117  soft-tissue]
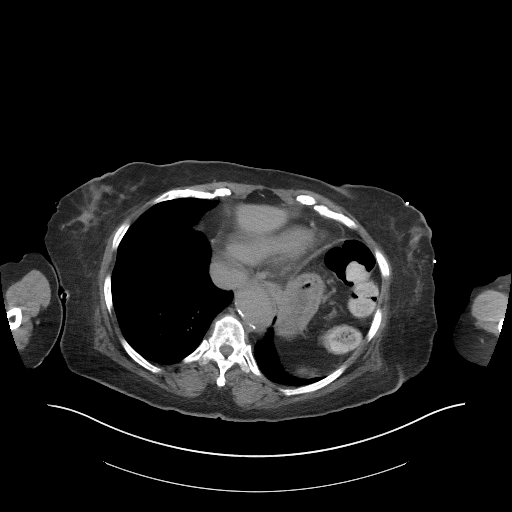
[im 112/117  soft-tissue]
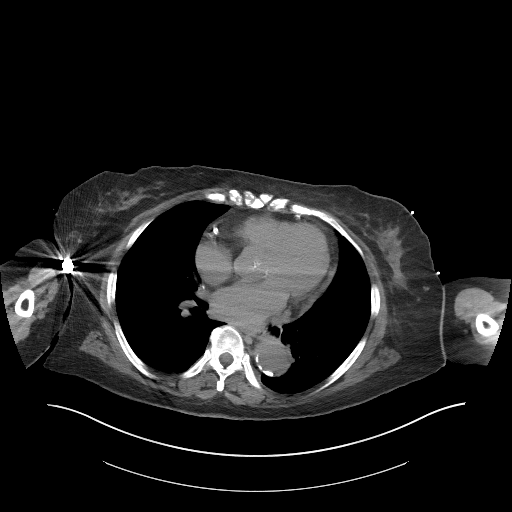

[Series 5: coronal st · coronal · 0.87mm/px · 3 of 90 slices shown]
[im 30/90  soft-tissue]
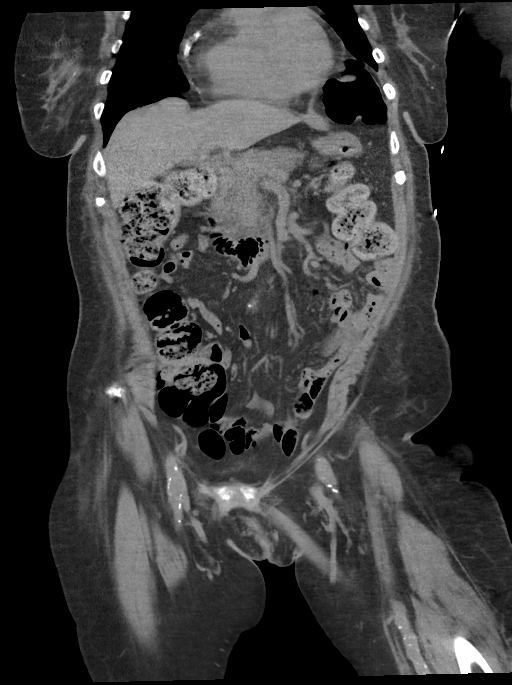
[im 40/90  soft-tissue]
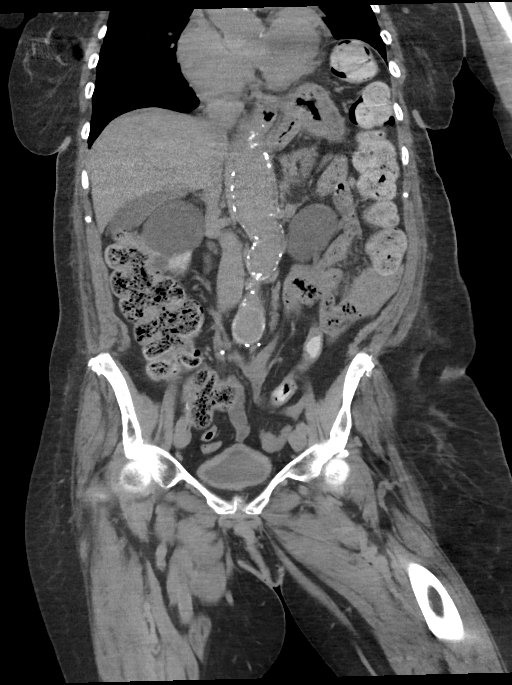
[im 50/90  soft-tissue]
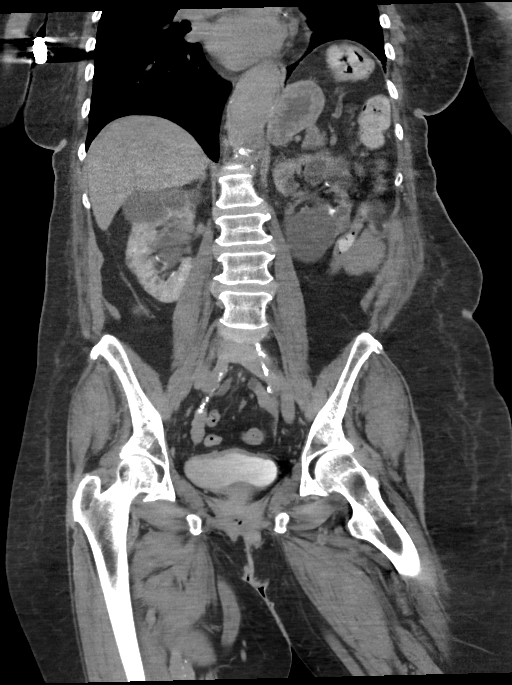

[15 of 46 positions shown; findings below may reference images not displayed]

FINDINGS: Lower chest: No acute finding of the lower chest. Similar appearance
of asymmetric elevation of the left hemidiaphragm.

Hepatobiliary: Unremarkable liver.  Unremarkable gallbladder.

Pancreas: Unremarkable

Spleen: Unremarkable

Adrenals/Urinary Tract: Bilateral adrenal glands unremarkable.

Right kidney demonstrates persisting nephrogram, compatible with
recent angiogram. No hydronephrosis. Early contrast excretion into
the collecting system.

Multiple low-density lesions of varying sizes, most compatible with
cysts and similar to the comparison CT.

Redemonstration of significant cortical thinning of the left kidney
with relatively asymmetric appearance of the persisting nephrogram.
Contrast present within the left collecting system compatible with
early excretion. Multiple low-density cystic lesions within the left
kidney, most likely simple cysts.

Stomach/Bowel: Unremarkable appearance of the stomach. Unremarkable
appearance of small bowel. No abnormal distension. No focal wall
thickening.

Large stool burden with formed stool throughout the length of the
majority of the colon. No focal inflammatory changes. Diverticular
disease of the left colon.

Vascular/Lymphatic: Redemonstration atherosclerotic changes of the
lower thoracic and of the abdominal aorta. Surgical changes of graft
repair of prior infrarenal abdominal aortic aneurysm.

Atherosclerotic changes of the bilateral iliac system.

Interval right renal artery stent placement.

Changes of recent right common femoral artery access with no local
hematoma.

No evidence of retroperitoneal hemorrhage or perivascular hemorrhage
of the right iliac system, aorta, or right renal artery.

No lymphadenopathy.

Reproductive: Crescent shaped low-density structure posterior to the
pubic symphysis, intimately associated with the anterior urethra,
most likely periurethral cyst.

Other: Injection granulomas of the lower left abdominal wall.

Musculoskeletal: No acute displaced fracture. Multilevel
degenerative changes of the spine. Degenerative changes of the hips.
IMPRESSION: CT is negative for evidence of retroperitoneal hemorrhage.

Interval right renal artery stenting.

Aortic atherosclerosis with surgical changes of prior open repair
infrarenal abdominal aortic aneurysm. Aortic Atherosclerosis
([V5]-[V5]).

Additional ancillary findings as above.

## 2021-06-11 SURGERY — RENAL ANGIOGRAPHY
Anesthesia: Moderate Sedation

## 2021-06-11 MED ORDER — ONDANSETRON HCL 4 MG/2ML IJ SOLN
INTRAMUSCULAR | Status: AC
  Administered 2021-06-11: 4 mg via INTRAVENOUS
  Filled 2021-06-11: qty 2

## 2021-06-11 MED ORDER — HYDRALAZINE HCL 20 MG/ML IJ SOLN
INTRAMUSCULAR | Status: DC | PRN
  Administered 2021-06-11: 20 mg via INTRAVENOUS

## 2021-06-11 MED ORDER — FENTANYL CITRATE PF 50 MCG/ML IJ SOSY
PREFILLED_SYRINGE | INTRAMUSCULAR | Status: AC
  Filled 2021-06-11: qty 1

## 2021-06-11 MED ORDER — FENTANYL CITRATE (PF) 100 MCG/2ML IJ SOLN
INTRAMUSCULAR | Status: DC | PRN
  Administered 2021-06-11 (×2): 25 ug via INTRAVENOUS

## 2021-06-11 MED ORDER — MIDAZOLAM HCL 2 MG/2ML IJ SOLN
INTRAMUSCULAR | Status: AC
  Filled 2021-06-11: qty 2

## 2021-06-11 MED ORDER — ONDANSETRON HCL 4 MG/2ML IJ SOLN: 4.0000 mg | Freq: Once | INTRAMUSCULAR | Status: AC

## 2021-06-11 MED ORDER — HEPARIN SODIUM (PORCINE) 1000 UNIT/ML IJ SOLN
INTRAMUSCULAR | Status: AC
  Filled 2021-06-11: qty 1

## 2021-06-11 MED ORDER — HYDROMORPHONE HCL 1 MG/ML IJ SOLN
0.5000 mg | Freq: Once | INTRAMUSCULAR | Status: AC
Start: 2021-06-11 — End: 2021-06-11

## 2021-06-11 MED ORDER — HYDRALAZINE HCL 20 MG/ML IJ SOLN
INTRAMUSCULAR | Status: AC
  Filled 2021-06-11: qty 1

## 2021-06-11 MED ORDER — HYDROMORPHONE HCL 1 MG/ML IJ SOLN
INTRAMUSCULAR | Status: AC
  Administered 2021-06-11: 0.5 mg via INTRAVENOUS
  Filled 2021-06-11: qty 0.5

## 2021-06-11 MED ORDER — FENTANYL CITRATE PF 50 MCG/ML IJ SOSY
6.2500 ug | PREFILLED_SYRINGE | INTRAMUSCULAR | Status: DC | PRN
  Administered 2021-06-11: 6.5 ug via INTRAVENOUS
  Filled 2021-06-11: qty 1

## 2021-06-11 MED ORDER — ASPIRIN EC 81 MG PO TBEC
81.0000 mg | DELAYED_RELEASE_TABLET | Freq: Every day | ORAL | Status: DC
  Administered 2021-06-12 – 2021-06-17 (×6): 81 mg via ORAL
  Filled 2021-06-11 (×6): qty 1

## 2021-06-11 MED ORDER — MIDAZOLAM HCL 2 MG/2ML IJ SOLN
INTRAMUSCULAR | Status: DC | PRN
  Administered 2021-06-11: 2 mg via INTRAVENOUS

## 2021-06-11 MED ORDER — HEPARIN SODIUM (PORCINE) 1000 UNIT/ML IJ SOLN
INTRAMUSCULAR | Status: DC | PRN
  Administered 2021-06-11: 2000 [IU] via INTRAVENOUS
  Administered 2021-06-11: 3000 [IU] via INTRAVENOUS

## 2021-06-11 SURGICAL SUPPLY — 16 items
CATH ANGIO 5F PIGTAIL 65CM (CATHETERS) ×1 IMPLANT
CATH BEACON 5 .035 65 C2 TIP (CATHETERS) ×1 IMPLANT
COVER PROBE U/S 5X48 (MISCELLANEOUS) ×1 IMPLANT
DEVICE SAFEGUARD 24CM (GAUZE/BANDAGES/DRESSINGS) ×1 IMPLANT
DEVICE STARCLOSE SE CLOSURE (Vascular Products) ×1 IMPLANT
DEVICE TORQUE (MISCELLANEOUS) ×1 IMPLANT
GLIDEWIRE STIFF .35X180X3 HYDR (WIRE) ×1 IMPLANT
KIT ENCORE 26 ADVANTAGE (KITS) ×1 IMPLANT
PACK ANGIOGRAPHY (CUSTOM PROCEDURE TRAY) ×2 IMPLANT
SHEATH ANL2 6FRX45 HC (SHEATH) ×1 IMPLANT
SHEATH BRITE TIP 5FRX11 (SHEATH) ×1 IMPLANT
STENT LIFESTREAM 6X26X80 (Permanent Stent) ×1 IMPLANT
SYR MEDRAD MARK 7 150ML (SYRINGE) ×1 IMPLANT
TUBING CONTRAST HIGH PRESS 48 (TUBING) ×2 IMPLANT
WIRE GUIDERIGHT .035X150 (WIRE) ×1 IMPLANT
WIRE MAGIC TOR.035 180C (WIRE) ×1 IMPLANT

## 2021-06-11 NOTE — Progress Notes (Signed)
Patient returned from procedure drowsy with difficulty maintaining concentration and staying awake. Patient given pain medication during recovery. Site CDI and vitals stable upon return. Patient sleeping at this time.

## 2021-06-11 NOTE — Progress Notes (Addendum)
[facility]  Patient Description: Betty Nielsen is a 75 year old female with PMH significant for CAD, NSTEMI s/p PCI/DES to mid RCA (2018), h/o acute ischemic CVA, poorly controlled hypertension, hyperlipidemia, CKD stage IIIb, anemia, thyroid disease, PVD and obesity who was admitted to [facility] due to strokelike symptoms and poorly controlled hypertension thus cardiology was consulted.     SUBJECTIVE: The patient reports that she is feeling better on today and she denies having any cardiac complaints. She denies having dyspnea at this time and states that she has been able to ambulate independently in the room without any issues.   (06/10/2021): The patient reports to be feeling well on today and states that she would like to be discharged. She denies having any cardiac symptoms at this time. The patient states that her blood pressure is "always around 200" at home and that she has been taking irbesartan, labetalol and clonidine. She states that she was prescribed nifedipine by her nephrologist, but did not want to take this medication; therefore, she did not pick it up from the pharmacy. She states that the clonidine causes her to having "congestion" and thus she would eventually like to be weaned off this medication also.   OBJECTIVE: The patient appear well on today, she continues to be hypertensive with a systolic bp above 99991111. The patient's bedside telemetry monitor reveals SR. She is resting comfortably on the side of her bed in NAD. The renal US was concerning for RAS, thus the patient is schedule to undergo renal angiogram via Vascular surgery on today.   (06/10/2021):  The patient appears fairly well on today, however, she continues to have a systolic blood pressure in the 200's. She is asymptomatic and all other vss are normal. Bedside telemetry reveals NSR with HR in the 60's. It may be that this patient has lived with this high blood pressure so long that a blood pressure within the normal range  causes her to have complications with dizziness. The patient is sitting in the chair at this time, resting comfortably and is in NAD. The patient was thoroughly educated on her current condition and was informed that she does have the right to leave AMA; however, it would be beneficial for her to stay because of the uncontrolled blood pressure. She verbalized understanding of all information.   Vitals:   06/10/21 2052 06/10/21 2335 06/11/21 0358 06/11/21 0516  BP: (!) 185/80 (!) 188/59 (!) 184/93   Pulse:  61 67   Resp: '18 15 12   '$ Temp: 98.2 F (36.8 C) 98.9 F (37.2 C) 98.4 F (36.9 C)   TempSrc: Oral Oral Oral   SpO2:  96% 95%   Weight:    80.8 kg  Height:         Intake/Output Summary (Last 24 hours) at 06/11/2021 0855 Last data filed at 06/10/2021 [id number] Gross per 24 hour  Intake 180 ml  Output 400 ml  Net -220 ml      PHYSICAL EXAM  General: Well developed, well nourished, in no acute distress HEENT:  Normocephalic and atramatic. PERRL Neck:   No JVD.  Lungs: Clear bilaterally to auscultation, chest expansion symmetrical, normal effort of breathing, no wheezes, rales or rhonchi Heart: HRRR . Normal S1 and S2 without gallops or murmurs.  Abdomen: Bowel sounds are positive, abdomen soft and non-tender  Msk:  Normal strength and tone for age. Extremities: No clubbing, cyanosis or edema.   Neuro: Alert and oriented X 3. Psych:  Good affect, responds appropriately  LABS: Basic Metabolic Panel: Recent Labs    06/09/21 0505 06/10/21 0510 06/11/21 0548  NA 133* 132* 130*  K 4.2 4.6 4.3  CL 99 99 97*  CO2 '24 26 24  '$ GLUCOSE 92 96 92  BUN 30* 30* 31*  CREATININE 2.55* 2.31* 2.16*  CALCIUM 9.2 8.9 9.2  MG 2.1 2.1  --   PHOS 4.4 3.8  --    Liver Function Tests: Recent Labs    06/09/21 0505 06/10/21 0510  ALBUMIN 3.4* 3.2*   No results for input(s): LIPASE, AMYLASE in the last 72 hours. CBC: Recent Labs    06/10/21 0510 06/11/21 0548  WBC 6.7 7.2  HGB  9.5* 9.6*  HCT 29.1* 28.6*  MCV 91.8 90.8  PLT 229 230   Cardiac Enzymes: No results for input(s): CKTOTAL, CKMB, CKMBINDEX, TROPONINI in the last 72 hours. BNP: Invalid input(s): POCBNP D-Dimer: No results for input(s): DDIMER in the last 72 hours. Hemoglobin A1C: No results for input(s): HGBA1C in the last 72 hours. Fasting Lipid Panel: No results for input(s): CHOL, HDL, LDLCALC, TRIG, CHOLHDL, LDLDIRECT in the last 72 hours. Thyroid Function Tests: No results for input(s): TSH, T4TOTAL, T3FREE, THYROIDAB in the last 72 hours.  Invalid input(s): FREET3 Anemia Panel: No results for input(s): VITAMINB12, FOLATE, FERRITIN, TIBC, IRON, RETICCTPCT in the last 72 hours.  CT HEAD WO CONTRAST (5MM)  Result Date: 06/09/2021 CLINICAL DATA:  Mental status change, unknown cause EXAM: CT HEAD WITHOUT CONTRAST TECHNIQUE: Contiguous axial images were obtained from the base of the skull through the vertex without intravenous contrast. COMPARISON:  06/06/2021 FINDINGS: Brain: No evidence of acute infarction, hemorrhage, hydrocephalus, extra-axial collection or mass lesion/mass effect. Scattered low-density changes within the periventricular and subcortical Trayce Maino matter compatible with chronic microvascular ischemic change. Mild diffuse cerebral volume loss. Vascular: Atherosclerotic calcifications involving the large vessels of the skull base. No unexpected hyperdense vessel. Skull: Normal. Negative for fracture or focal lesion. Sinuses/Orbits: No acute finding. Other: None. IMPRESSION: No acute intracranial findings.  Stable exam. Electronically Signed   By: Davina Poke D.O.   On: 06/09/2021 11:37   DG Chest Port 1 View  Result Date: 06/09/2021 CLINICAL DATA:  Syncope EXAM: PORTABLE CHEST 1 VIEW COMPARISON:  06/06/2021 FINDINGS: Chronic cardiomegaly. Chronic aortic atherosclerosis with tortuosity. Chronic elevation of the left hemidiaphragm. No pulmonary edema. No infiltrate or active collapse.  No effusion. IMPRESSION: Chronic cardiomegaly and aortic atherosclerotic disease. Chronic elevation of the left hemidiaphragm. No change since the previous study. Electronically Signed   By: Nelson Chimes M.D.   On: 06/09/2021 11:08   US RENAL ARTERY DUPLEX COMPLETE  Result Date: 06/10/2021 CLINICAL DATA:  Hypertension EXAM: RENAL/URINARY TRACT ULTRASOUND RENAL DUPLEX DOPPLER ULTRASOUND COMPARISON:  CTA December 2019 FINDINGS: Right Kidney: Length: 12 cm. Echogenicity within normal limits. No hydronephrosis. A couple of renal cysts are again seen with the largest measuring up to 5 cm. Left Kidney: Length: 13.2 cm. Echogenicity within normal limits. No hydronephrosis. Numerous cysts are again seen, with the largest measuring up to 6.5 cm. Bladder:  Within normal limits. 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 IMPRESSION: 1. By Doppler velocity criteria alone, there are no findings on this exam to suggest significant renal artery stenosis. 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 Findings on ultrasound therefore are felt to be under representative. Further evaluation could be done with either repeat CTA, or referral to a service such as interventional radiology for consideration of catheter directed renal angiography and potential  treatment. 2. 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 Recommend follow-up every 12 months and vascular consultation. This recommendation follows ACR consensus guidelines: Brelee Renk Paper of the ACR Incidental Findings Committee II on Vascular Findings. J Am Coll Radiol 2013; 10:789-794. 3. Bilateral simple renal cysts, grossly stable. Electronically Signed   By: Albin Felling M.D.   On: 06/10/2021 10:48       TELEMETRY: NSR  ASSESSMENT AND PLAN:  Principal Problem:   CVA (cerebral vascular accident) ([place]) Active Problems:   Hypertension, benign   Hypothyroidism, postsurgical   CKD (chronic kidney disease), stage III ([facility])   Hyperlipidemia   Chronic hyponatremia   Anemia in chronic kidney disease   Left upper extremity numbness   Left facial numbness   Elevated serum creatinine   Accelerated hypertension    Syncope, possibly due to rapid decrease in blood pressure, in addition to patient not being accustomed to a blood pressure that is within normal range  ECG was unremarkable.   High-sensitivity troponin was unremarkable. Recommend continuous cardiac monitoring until discharge. Recommend monitoring blood pressure closely per protocol. Recommend ambulating with assistance and changing positions slowly.   Poorly controlled blood pressure, patient is hypertensive at this time with a systolic blood pressure of above 200 Continue labetalol '200mg'$  twice daily per at home dose. Continue clonidine patch.  Continue hydrochlorothiazide to 12.5 mg once daily. Renal ultrasound concerning for possible RAS; thus Renal angiography will be performed today via Vascular Surgery.  Low-sodium, heart healthy diet recommended at this time. Strict intake and output.    History of CVA with current strokelike symptoms Recommend careful blood pressure control as the patient's history of CVA indicates  ischemic etiology. Recommend continuing Plavix for antiplatelet therapy.   Hyperlipidemia, reasonably controlled Recommend continuing atorvastatin therapy.   CKD stage IIIb, fairly stable Recommend trending CMP. Proceed with renal angiography.    [place], ACNPC-AG  06/11/2021 8:55 AM

## 2021-06-11 NOTE — Interval H&P Note (Signed)
History and Physical Interval Note:  06/11/2021 10:36 AM  Betty Nielsen  has presented today for surgery, with the diagnosis of hypertension, renal artery stenosis.  The various methods of treatment have been discussed with the patient and family. After consideration of risks, benefits and other options for treatment, the patient has consented to  Procedure(s): RENAL ANGIOGRAPHY (N/A) as a surgical intervention.  The patient's history has been reviewed, patient examined, no change in status, stable for surgery.  I have reviewed the patient's chart and labs.  Questions were answered to the patient's satisfaction.     Leotis Pain

## 2021-06-11 NOTE — Op Note (Signed)
[facility]  Percutaneous Study/Intervention Procedural Note    Surgeon(s): [employer]   Assistants: None  Pre-operative Diagnosis:  renal artery stenosis, renovascular hypertension  Post-operative diagnosis:  Same  Procedure(s) Performed:             1.  Ultrasound guidance for vascular access right femoral artery             2.  Catheter placement into right renal artery from right femoral approach             3.  Aortogram and selective right renal angiogram             4.  Balloon expandable stent placement to the right renal artery with a 6 mm diameter x 26 mm length lifestream stent             5.  StarClose closure device right femoral artery              Contrast: 55 cc  EBL: 10 cc  Fluoro Time: 5.4 minutes  Moderate conscious sedation: Approximately 30 minutes with 2 mg of Versed and 50 mcg of Fentanyl  Indications:  The patient is a 75 yo female with worsening severe hypertension despite multiple medications. The patient has suboptimal blood pressure control despite multiple antihypertensives.  The patient also has significant chronic kidney disease with a GFR of approximately 23.  She had a CT scan several years ago which suggested significant renal artery stenosis.  A recent duplex did not, but given her hospitals limitations with renal artery duplex this is not really that reliable. Given the clinical scenario and the noninvasive findings, angiogram is indicated for further evaluation of her renal artery and potential treatment. Risks and benefits are discussed and informed consent is obtained.  Procedure:  The patient was identified and appropriate procedural time out was performed.  The patient was then placed supine on the table and prepped and draped in the usual sterile fashion. Moderate conscious sedation was administered with a face to face encounter with the patient throughout the procedure with my supervision of the RN administering  medicines and monitoring the patients vital signs and mental status throughout from the start of the procedure until the patient was taken to the recovery room  Ultrasound was used to evaluate the right common femoral artery.  It was patent .  A digital ultrasound image was acquired.  A Seldinger needle was used to access the right common femoral artery under direct ultrasound guidance and a permanent image was performed.  A 0.035 J wire was advanced without resistance and a 5Fr sheath was placed.  Pigtail catheter was placed into the aorta at the L1 level and an AP aortogram was performed. 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  The left renal artery was not seen and appear to be occluded.  The right renal artery appeared to be significantly diseased.  The patient was then systemically heparinized with 5000 units of intravenous heparin. I used a C2 catheter to cannulate the right renal artery and selective imaging was performed. This confirmed a high-grade stenosis of greater than 80% at the origin of the right renal artery with significant poststenotic dilatation.  At this point I selected the glide wire and crossed the lesion without difficulty.  I then advanced the C2 catheter out into the renal artery and exchanged for a Magic torque wire.  The C2 catheter was removed and we  upsized to a 6 [place] Ansell sheath which was placed in the proximal right renal artery. I then selected a 6 mm diameter x 26 mm length balloon expandable stent and brought this across the lesion.  This was deployed encompassing the lesion with its proximal extent going back into the aorta for a mm or two.  This was inflated to 14 ATM and the waist resolved.  Completion angiogram showed an excellent nephrogram with less than 10% residual stenosis in the right renal artery. Attempt to cannulate left renal artery appeared to show a small nub of the left renal artery with occlusion.   The guide catheter was removed. Oblique arteriogram was performed of the right femoral artery and StarClose closure device was deployed in the usual fashion with excellent hemostatic result. The patient was taken to the recovery room in stable condition having tolerated the procedure well.  Findings:               Aortogram/Renal Arteries: Thoracoabdominal aneurysm present.  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  Apparent left renal artery occlusion   Condition:  Stable  Complications: None   Leotis Pain 06/11/2021 11:14 AM  This note was created with Dragon Medical transcription system. Any errors in dictation are purely unintentional.

## 2021-06-11 NOTE — Progress Notes (Addendum)
PROGRESS NOTE    Betty Nielsen  [id number] DOB: 03-14-1946 DOA: 06/06/2021 PCP: Steele Sizer, MD   Assessment & Plan:   Principal Problem:   CVA (cerebral vascular accident) [facility]) Active Problems:   Hypertension, benign   Hypothyroidism, postsurgical   CKD (chronic kidney disease), stage III (HCC)   Hyperlipidemia   Chronic hyponatremia   Anemia in chronic kidney disease   Left upper extremity numbness   Left facial numbness   Elevated serum creatinine   Accelerated hypertension  Syncopal episode: with some LOC but no fall.  Likely due to medication    HTN: uncontrolled. Continue on home dose of labetalol, start clonidine patch, continue HCTZ and d/c oral clonidine. [place] of renal arteries show severe stenosis of left renal artery   Right renal artery stenosis: as per vascular surg. 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 Will need repeat imaging in 12 months & will need to f/u outpatient w/ vascular surg    LUE/left face paresthesia: etiology unclear, possibly due to uncontrolled hypertension. CT head, MRI brain, MRA head, TTE and bilateral carotid US without significant finding. Symptoms resolved.   AKI on CKDIIIb: Cr is trending down from day prior. Avoid nephrotoxic meds   Hx of right right basal ganglia CVA: continue on plavix, statin   HLD: continue on statin   Hypothyroidism: continue on home dose of levothyroxine   GERD: continue on pepcid    DVT prophylaxis: heparin  Code Status: full  Family Communication:  Disposition Plan: d/c back home   Level of care: Progressive Cardiac  Status is: Inpatient  Remains inpatient appropriate because:Ongoing diagnostic testing needed not appropriate for outpatient work up, IV treatments appropriate due to intensity of illness or inability to take PO, and Inpatient level of care appropriate due to severity of illness  Dispo:  The patient is from: Home              Anticipated d/c is to: Home              Patient currently is not medically stable to d/c.   Difficult to place patient : unclear        Consultants:  Cardio   Procedures:   Antimicrobials:    Subjective: Pt c/o malaise   Objective: Vitals:   06/10/21 2052 06/10/21 2335 06/11/21 0358 06/11/21 0516  BP: (!) 185/80 (!) 188/59 (!) 184/93   Pulse:  61 67   Resp: '18 15 12   '$ Temp: 98.2 F (36.8 C) 98.9 F (37.2 C) 98.4 F (36.9 C)   TempSrc: Oral Oral Oral   SpO2:  96% 95%   Weight:    80.8 kg  Height:        Intake/Output Summary (Last 24 hours) at 06/11/2021 0746 Last data filed at 06/10/2021 1852 Gross per 24 hour  Intake 180 ml  Output 400 ml  Net -220 ml   Filed Weights   06/08/21 2136 06/09/21 0510 06/11/21 0516  Weight: 81.3 kg 80.9 kg 80.8 kg    Examination:  General exam: Appears comfortable  Respiratory system: Clear breath sounds b/l Cardiovascular system: S1&S2+. No rubs or clicks  Gastrointestinal system: Abd is soft, NT, ND & hypoactive bowel sounds  Central nervous system: Alert and oriented. Moves all extremities   Psychiatry: Judgement and insight appear normal. Flat mood and affect     Data Reviewed: I  have personally reviewed following labs and imaging studies  CBC: Recent Labs  Lab 06/06/21 2110 06/07/21 0740 06/08/21 0707 06/09/21 0505 06/10/21 0510 06/11/21 0548  WBC 5.8 7.8 7.0 6.7 6.7 7.2  NEUTROABS 2.5  --   --   --   --   --   HGB 10.0* 10.9* 10.8* 10.4* 9.5* 9.6*  HCT 29.9* 32.1* 32.1* 30.5* 29.1* 28.6*  MCV 94.0 91.7 90.9 93.0 91.8 90.8  PLT 213 233 255 242 229 [id number]   Basic Metabolic Panel: Recent Labs  Lab 06/06/21 2345 06/07/21 0740 06/08/21 0707 06/09/21 0505 06/10/21 0510 06/11/21 0548  NA  --  132* 131* 133* 132* 130*  K  --  4.7 4.5 4.2 4.6 4.3  CL  --  99 98 99 99 97*  CO2  --  '23 23 24 26 24  '$ GLUCOSE  --  109* 102* 92 96 92  BUN  --  24* 26* 30* 30* 31*   CREATININE  --  1.87* 2.51* 2.55* 2.31* 2.16*  CALCIUM  --  9.6 9.6 9.2 8.9 9.2  MG 2.0 2.0 1.9 2.1 2.1  --   PHOS  --   --  4.1 4.4 3.8  --    GFR: Estimated Creatinine Clearance: 23.6 mL/min (A) (by C-G formula based on SCr of 2.16 mg/dL (H)). Liver Function Tests: Recent Labs  Lab 06/06/21 2110 06/07/21 0740 06/08/21 0707 06/09/21 0505 06/10/21 0510  AST 20 23  --   --   --   ALT 12 12  --   --   --   ALKPHOS 89 84  --   --   --   BILITOT 0.6 0.7  --   --   --   PROT 6.6 6.9  --   --   --   ALBUMIN 3.7 3.8 3.9 3.4* 3.2*   No results for input(s): LIPASE, AMYLASE in the last 168 hours. No results for input(s): AMMONIA in the last 168 hours. Coagulation Profile: Recent Labs  Lab 06/06/21 2110  INR 1.0   Cardiac Enzymes: No results for input(s): CKTOTAL, CKMB, CKMBINDEX, TROPONINI in the last 168 hours. BNP (last 3 results) No results for input(s): PROBNP in the last 8760 hours. HbA1C: No results for input(s): HGBA1C in the last 72 hours.  CBG: Recent Labs  Lab 06/06/21 2110 06/09/21 1015  GLUCAP 114* 133*   Lipid Profile: No results for input(s): CHOL, HDL, LDLCALC, TRIG, CHOLHDL, LDLDIRECT in the last 72 hours.  Thyroid Function Tests: No results for input(s): TSH, T4TOTAL, FREET4, T3FREE, THYROIDAB in the last 72 hours. Anemia Panel: No results for input(s): VITAMINB12, FOLATE, FERRITIN, TIBC, IRON, RETICCTPCT in the last 72 hours. Sepsis Labs: No results for input(s): PROCALCITON, LATICACIDVEN in the last 168 hours.  Recent Results (from the past 240 hour(s))  Resp Panel by RT-PCR (Flu A&B, Covid) Nasopharyngeal Swab     Status: None   Collection Time: 06/06/21  9:57 PM   Specimen: Nasopharyngeal Swab; Nasopharyngeal(NP) swabs in vial transport medium  Result Value Ref Range Status   SARS Coronavirus 2 by RT PCR NEGATIVE NEGATIVE Final    Comment: (NOTE) SARS-CoV-2 target nucleic acids are NOT DETECTED.  The SARS-CoV-2 RNA is generally detectable  in upper respiratory specimens during the acute phase of infection. The lowest concentration of SARS-CoV-2 viral copies this assay can detect is 138 copies/mL. A negative result does not preclude SARS-Cov-2 infection and should not be used as the sole basis for treatment or other  patient management decisions. A negative result may occur with  improper specimen collection/handling, submission of specimen other than nasopharyngeal swab, presence of viral mutation(s) within the areas targeted by this assay, and inadequate number of viral copies(<138 copies/mL). A negative result must be combined with clinical observations, patient history, and epidemiological information. The expected result is Negative.  Fact Sheet for Patients:  EntrepreneurPulse.com.au  Fact Sheet for Healthcare Providers:  IncredibleEmployment.be  This test is no t yet approved or cleared by the [place] FDA and  has been authorized for detection and/or diagnosis of SARS-CoV-2 by FDA under an Emergency Use Authorization (EUA). This EUA will remain  in effect (meaning this test can be used) for the duration of the COVID-19 declaration under Section 564(b)(1) of the Act, 21 U.S.C.section 360bbb-3(b)(1), unless the authorization is terminated  or revoked sooner.       Influenza A by PCR NEGATIVE NEGATIVE Final   Influenza B by PCR NEGATIVE NEGATIVE Final    Comment: (NOTE) The Xpert Xpress SARS-CoV-2/FLU/RSV plus assay is intended as an aid in the diagnosis of influenza from Nasopharyngeal swab specimens and should not be used as a sole basis for treatment. Nasal washings and aspirates are unacceptable for Xpert Xpress SARS-CoV-2/FLU/RSV testing.  Fact Sheet for Patients: EntrepreneurPulse.com.au  Fact Sheet for Healthcare Providers: IncredibleEmployment.be  This test is not yet approved or cleared by the [place] FDA and has  been authorized for detection and/or diagnosis of SARS-CoV-2 by FDA under an Emergency Use Authorization (EUA). This EUA will remain in effect (meaning this test can be used) for the duration of the COVID-19 declaration under Section 564(b)(1) of the Act, 21 U.S.C. section 360bbb-3(b)(1), unless the authorization is terminated or revoked.  Performed at [facility], [address] [place], [place] [address]          Radiology Studies: CT HEAD WO CONTRAST (5MM)  Result Date: 06/09/2021 CLINICAL DATA:  Mental status change, unknown cause EXAM: CT HEAD WITHOUT CONTRAST TECHNIQUE: Contiguous axial images were obtained from the base of the skull through the vertex without intravenous contrast. COMPARISON:  06/06/2021 FINDINGS: Brain: No evidence of acute infarction, hemorrhage, hydrocephalus, extra-axial collection or mass lesion/mass effect. Scattered low-density changes within the periventricular and subcortical white matter compatible with chronic microvascular ischemic change. Mild diffuse cerebral volume loss. Vascular: Atherosclerotic calcifications involving the large vessels of the skull base. No unexpected hyperdense vessel. Skull: Normal. Negative for fracture or focal lesion. Sinuses/Orbits: No acute finding. Other: None. IMPRESSION: No acute intracranial findings.  Stable exam. Electronically Signed   By: Davina Poke D.O.   On: 06/09/2021 11:37   DG Chest Port 1 View  Result Date: 06/09/2021 CLINICAL DATA:  Syncope EXAM: PORTABLE CHEST 1 VIEW COMPARISON:  06/06/2021 FINDINGS: Chronic cardiomegaly. Chronic aortic atherosclerosis with tortuosity. Chronic elevation of the left hemidiaphragm. No pulmonary edema. No infiltrate or active collapse. No effusion. IMPRESSION: Chronic cardiomegaly and aortic atherosclerotic disease. Chronic elevation of the left hemidiaphragm. No change since the previous study. Electronically Signed   By: Nelson Chimes M.D.   On: 06/09/2021 11:08    US RENAL ARTERY DUPLEX COMPLETE  Result Date: 06/10/2021 CLINICAL DATA:  Hypertension EXAM: RENAL/URINARY TRACT ULTRASOUND RENAL DUPLEX DOPPLER ULTRASOUND COMPARISON:  CTA December 2019 FINDINGS: Right Kidney: Length: 12 cm. Echogenicity within normal limits. No hydronephrosis. A couple of renal cysts are again seen with the largest measuring up to 5 cm. Left Kidney: Length: 13.2 cm. Echogenicity within normal limits. No hydronephrosis. Numerous cysts are  again seen, with the largest measuring up to 6.5 cm. Bladder:  Within normal limits. 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 IMPRESSION: 1. By Doppler velocity criteria alone, there are no findings on this exam to suggest significant renal artery stenosis. 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 Findings on ultrasound therefore are felt to be under representative. Further evaluation could be done with either repeat CTA, or referral to a service such as interventional radiology for consideration of catheter directed renal angiography and potential treatment. 2. 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 Recommend follow-up every 12 months and vascular consultation. This  recommendation follows ACR consensus guidelines: White Paper of the ACR Incidental Findings Committee II on Vascular Findings. J Am Coll Radiol 2013; 10:789-794. 3. Bilateral simple renal cysts, grossly stable. Electronically Signed   By: Albin Felling M.D.   On: 06/10/2021 10:48        Scheduled Meds:  atorvastatin  40 mg Oral q1800   cloNIDine  0.2 mg Transdermal Weekly   clopidogrel  75 mg Oral Daily   famotidine  10 mg Oral Daily   heparin  5,000 Units Subcutaneous Q8H   hydrochlorothiazide  12.5 mg Oral Daily   irbesartan  300 mg Oral Daily   labetalol  200 mg Oral BID   levothyroxine  75 mcg Oral Q0600   Continuous Infusions:  sodium chloride      ceFAZolin (ANCEF) IV       LOS: 2 days    Time spent: 30 mins     Wyvonnia Dusky, MD [facility] Pager [phone]  If 7PM-7AM, please contact night-coverage 06/11/2021, 7:46 AM

## 2021-06-12 DIAGNOSIS — I701 Atherosclerosis of renal artery: Secondary | ICD-10-CM | POA: Diagnosis not present

## 2021-06-12 DIAGNOSIS — N179 Acute kidney failure, unspecified: Secondary | ICD-10-CM | POA: Diagnosis not present

## 2021-06-12 DIAGNOSIS — I10 Essential (primary) hypertension: Secondary | ICD-10-CM | POA: Diagnosis not present

## 2021-06-12 LAB — CBC
HCT: 30.7 % — ABNORMAL LOW (ref 36.0–46.0)
Hemoglobin: 10.3 g/dL — ABNORMAL LOW (ref 12.0–15.0)
MCH: 30.9 pg (ref 26.0–34.0)
MCHC: 33.6 g/dL (ref 30.0–36.0)
MCV: 92.2 fL (ref 80.0–100.0)
Platelets: 224 10*3/uL (ref 150–400)
RBC: 3.33 MIL/uL — ABNORMAL LOW (ref 3.87–5.11)
RDW: 12.7 % (ref 11.5–15.5)
WBC: 9.5 10*3/uL (ref 4.0–10.5)
nRBC: 0 % (ref 0.0–0.2)

## 2021-06-12 LAB — BASIC METABOLIC PANEL
Anion gap: 9 (ref 5–15)
BUN: 41 mg/dL — ABNORMAL HIGH (ref 8–23)
CO2: 23 mmol/L (ref 22–32)
Calcium: 9 mg/dL (ref 8.9–10.3)
Chloride: 98 mmol/L (ref 98–111)
Creatinine, Ser: 4.04 mg/dL — ABNORMAL HIGH (ref 0.44–1.00)
GFR, Estimated: 11 mL/min — ABNORMAL LOW (ref >60)
Glucose, Bld: 91 mg/dL (ref 70–99)
Potassium: 4.8 mmol/L (ref 3.5–5.1)
Sodium: 130 mmol/L — ABNORMAL LOW (ref 135–145)

## 2021-06-12 MED ORDER — ASPIRIN 81 MG PO TBEC: 81.0000 mg | DELAYED_RELEASE_TABLET | Freq: Every day | ORAL | 0 refills | Status: AC

## 2021-06-12 MED ORDER — SODIUM CHLORIDE 0.9 % IV SOLN: INTRAVENOUS | Status: DC

## 2021-06-12 MED ORDER — LABETALOL HCL 200 MG PO TABS: 200.0000 mg | ORAL_TABLET | Freq: Two times a day (BID) | ORAL | 0 refills | Status: DC

## 2021-06-12 MED ORDER — CLONIDINE HCL 0.2 MG/24HR TD PTWK: 0.2000 mg | MEDICATED_PATCH | TRANSDERMAL | 0 refills | Status: DC

## 2021-06-12 NOTE — Care Management Important Message (Signed)
Important Message  Patient Details  Name: Betty Nielsen MRN: [id number] Date of Birth: 06-05-1946   Medicare Important Message Given:  Yes     Dannette Barbara 06/12/2021, 1:39 PM

## 2021-06-12 NOTE — Progress Notes (Signed)
PROGRESS NOTE    Betty Nielsen  [id number] DOB: 03/11/1946 DOA: 06/06/2021 PCP: Steele Sizer, MD   Assessment & Plan:   Principal Problem:   CVA (cerebral vascular accident) [facility]) Active Problems:   Hypertension, benign   Hypothyroidism, postsurgical   CKD (chronic kidney disease), stage III (HCC)   Hyperlipidemia   Chronic hyponatremia   Anemia in chronic kidney disease   Left upper extremity numbness   Left facial numbness   Elevated serum creatinine   Accelerated hypertension  Syncopal episode: with some LOC but no fall.  Likely due to medication    HTN: uncontrolled. Continue on labetalol, clonidine patch, HCTZ as per cardio.   Right renal artery stenosis: as per vascular surg. 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 Will need repeat imaging in 12 months & will need to f/u outpatient w/ vascular surg    LUE/left face paresthesia: etiology unclear, possibly due to uncontrolled hypertension. CT head, MRI brain, MRA head, TTE and bilateral carotid US without significant finding. Symptoms resolved.   AKI on CKDIIIb: Cr is trending up from day prior. Will start some IVFs. Avoid nephrotoxic meds   Hx of right right basal ganglia CVA: continue on statin, plavix    HLD: continue on statin   Hypothyroidism: continue on home dose of levothyroxine   GERD: continue on pepcid    DVT prophylaxis: heparin  Code Status: full  Family Communication:  Disposition Plan: d/c back home   Level of care: Progressive Cardiac  Status is: Inpatient  Remains inpatient appropriate because:Ongoing diagnostic testing needed not appropriate for outpatient work up, IV treatments appropriate due to intensity of illness or inability to take PO, and Inpatient level of care appropriate due to severity of illness  Dispo: The patient is from: Home              Anticipated d/c is to: Home               Patient currently is not medically stable to d/c.   Difficult to place patient : unclear        Consultants:  Cardio  Vascular surg  Procedures:   Antimicrobials:    Subjective: Pt c/o fatigue   Objective: Vitals:   06/11/21 1959 06/11/21 2338 06/12/21 0224 06/12/21 1203  BP: (!) 196/84 (!) 167/88 (!) 160/67 (!) 156/76  Pulse: 63 68 60 72  Resp: '18 18 18 17  '$ Temp: 98 F (36.7 C) 98.6 F (37 C) 98.1 F (36.7 C) 98.4 F (36.9 C)  TempSrc:    Oral  SpO2: 100% 98% 99% 100%  Weight:      Height:        Intake/Output Summary (Last 24 hours) at 06/12/2021 1326 Last data filed at 06/12/2021 1313 Gross per 24 hour  Intake 1200 ml  Output 150 ml  Net 1050 ml   Filed Weights   06/09/21 0510 06/11/21 0516 06/11/21 1001  Weight: 80.9 kg 80.8 kg 81.2 kg    Examination:  General exam: Appears comfortable Respiratory system: Clear breath sounds b/l. No rales  Cardiovascular system: S1/S2+. No rubs or gallops  Gastrointestinal system: Abd is soft, NT, ND & hypoactive bowel sounds  Central nervous system: Alert and oriented. Moves all extremities Psychiatry: Judgement and insight appear normal. Flat mood and affect     Data Reviewed: I have personally reviewed following labs and imaging  studies  CBC: Recent Labs  Lab 06/06/21 2110 06/07/21 0740 06/08/21 0707 06/09/21 0505 06/10/21 0510 06/11/21 0548 06/12/21 0802  WBC 5.8   < > 7.0 6.7 6.7 7.2 9.5  NEUTROABS 2.5  --   --   --   --   --   --   HGB 10.0*   < > 10.8* 10.4* 9.5* 9.6* 10.3*  HCT 29.9*   < > 32.1* 30.5* 29.1* 28.6* 30.7*  MCV 94.0   < > 90.9 93.0 91.8 90.8 92.2  PLT 213   < > 255 242 229 230 224   < > = values in this interval not displayed.   Basic Metabolic Panel: Recent Labs  Lab 06/06/21 2345 06/07/21 0740 06/08/21 0707 06/09/21 0505 06/10/21 0510 06/11/21 0548 06/12/21 0802  NA  --  132* 131* 133* 132* 130* 130*  K  --  4.7 4.5 4.2 4.6 4.3 4.8  CL  --  99 98 99 99 97* 98  CO2   --  '23 23 24 26 24 23  '$ GLUCOSE  --  109* 102* 92 96 92 91  BUN  --  24* 26* 30* 30* 31* 41*  CREATININE  --  1.87* 2.51* 2.55* 2.31* 2.16* 4.04*  CALCIUM  --  9.6 9.6 9.2 8.9 9.2 9.0  MG 2.0 2.0 1.9 2.1 2.1  --   --   PHOS  --   --  4.1 4.4 3.8  --   --    GFR: Estimated Creatinine Clearance: 12.7 mL/min (A) (by C-G formula based on SCr of 4.04 mg/dL (H)). Liver Function Tests: Recent Labs  Lab 06/06/21 2110 06/07/21 0740 06/08/21 0707 06/09/21 0505 06/10/21 0510  AST 20 23  --   --   --   ALT 12 12  --   --   --   ALKPHOS 89 84  --   --   --   BILITOT 0.6 0.7  --   --   --   PROT 6.6 6.9  --   --   --   ALBUMIN 3.7 3.8 3.9 3.4* 3.2*   No results for input(s): LIPASE, AMYLASE in the last 168 hours. No results for input(s): AMMONIA in the last 168 hours. Coagulation Profile: Recent Labs  Lab 06/06/21 2110  INR 1.0   Cardiac Enzymes: No results for input(s): CKTOTAL, CKMB, CKMBINDEX, TROPONINI in the last 168 hours. BNP (last 3 results) No results for input(s): PROBNP in the last 8760 hours. HbA1C: No results for input(s): HGBA1C in the last 72 hours.  CBG: Recent Labs  Lab 06/06/21 2110 06/09/21 1015  GLUCAP 114* 133*   Lipid Profile: No results for input(s): CHOL, HDL, LDLCALC, TRIG, CHOLHDL, LDLDIRECT in the last 72 hours.  Thyroid Function Tests: No results for input(s): TSH, T4TOTAL, FREET4, T3FREE, THYROIDAB in the last 72 hours. Anemia Panel: No results for input(s): VITAMINB12, FOLATE, FERRITIN, TIBC, IRON, RETICCTPCT in the last 72 hours. Sepsis Labs: No results for input(s): PROCALCITON, LATICACIDVEN in the last 168 hours.  Recent Results (from the past 240 hour(s))  Resp Panel by RT-PCR (Flu A&B, Covid) Nasopharyngeal Swab     Status: None   Collection Time: 06/06/21  9:57 PM   Specimen: Nasopharyngeal Swab; Nasopharyngeal(NP) swabs in vial transport medium  Result Value Ref Range Status   SARS Coronavirus 2 by RT PCR NEGATIVE NEGATIVE Final     Comment: (NOTE) SARS-CoV-2 target nucleic acids are NOT DETECTED.  The SARS-CoV-2 RNA is generally detectable  in upper respiratory specimens during the acute phase of infection. The lowest concentration of SARS-CoV-2 viral copies this assay can detect is 138 copies/mL. A negative result does not preclude SARS-Cov-2 infection and should not be used as the sole basis for treatment or other patient management decisions. A negative result may occur with  improper specimen collection/handling, submission of specimen other than nasopharyngeal swab, presence of viral mutation(s) within the areas targeted by this assay, and inadequate number of viral copies(<138 copies/mL). A negative result must be combined with clinical observations, patient history, and epidemiological information. The expected result is Negative.  Fact Sheet for Patients:  EntrepreneurPulse.com.au  Fact Sheet for Healthcare Providers:  IncredibleEmployment.be  This test is no t yet approved or cleared by the [place] FDA and  has been authorized for detection and/or diagnosis of SARS-CoV-2 by FDA under an Emergency Use Authorization (EUA). This EUA will remain  in effect (meaning this test can be used) for the duration of the COVID-19 declaration under Section 564(b)(1) of the Act, 21 U.S.C.section 360bbb-3(b)(1), unless the authorization is terminated  or revoked sooner.       Influenza A by PCR NEGATIVE NEGATIVE Final   Influenza B by PCR NEGATIVE NEGATIVE Final    Comment: (NOTE) The Xpert Xpress SARS-CoV-2/FLU/RSV plus assay is intended as an aid in the diagnosis of influenza from Nasopharyngeal swab specimens and should not be used as a sole basis for treatment. Nasal washings and aspirates are unacceptable for Xpert Xpress SARS-CoV-2/FLU/RSV testing.  Fact Sheet for Patients: EntrepreneurPulse.com.au  Fact Sheet for Healthcare  Providers: IncredibleEmployment.be  This test is not yet approved or cleared by the [place] FDA and has been authorized for detection and/or diagnosis of SARS-CoV-2 by FDA under an Emergency Use Authorization (EUA). This EUA will remain in effect (meaning this test can be used) for the duration of the COVID-19 declaration under Section 564(b)(1) of the Act, 21 U.S.C. section 360bbb-3(b)(1), unless the authorization is terminated or revoked.  Performed at [facility] [address] [place], [place] [address]          Radiology Studies: CT ABDOMEN PELVIS WO CONTRAST  Result Date: 06/11/2021 CLINICAL DATA:  75 year old female with a history of possible retroperitoneal hematoma EXAM: CT ABDOMEN AND PELVIS WITHOUT CONTRAST TECHNIQUE: Multidetector CT imaging of the abdomen and pelvis was performed following the standard protocol without IV contrast. COMPARISON:  CT 08/21/2018, 11/19/2014 FINDINGS: Lower chest: No acute finding of the lower chest. Similar appearance of asymmetric elevation of the left hemidiaphragm. Hepatobiliary: Unremarkable liver.  Unremarkable gallbladder. Pancreas: Unremarkable Spleen: Unremarkable Adrenals/Urinary Tract: Bilateral adrenal glands unremarkable. Right kidney demonstrates persisting nephrogram, compatible with recent angiogram. No hydronephrosis. Early contrast excretion into the collecting system. Multiple low-density lesions of varying sizes, most compatible with cysts and similar to the comparison CT. Redemonstration of significant cortical thinning of the left kidney with relatively asymmetric appearance of the persisting nephrogram. Contrast present within the left collecting system compatible with early excretion. Multiple low-density cystic lesions within the left kidney, most likely simple cysts. Stomach/Bowel: Unremarkable appearance of the stomach. Unremarkable appearance of small bowel. No abnormal distension. No  focal wall thickening. Large stool burden with formed stool throughout the length of the majority of the colon. No focal inflammatory changes. Diverticular disease of the left colon. Vascular/Lymphatic: Redemonstration atherosclerotic changes of the lower thoracic and of the abdominal aorta. 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 Atherosclerotic changes of the bilateral iliac system. Interval  right renal artery stent placement. Changes of recent right common femoral artery access with no local hematoma. No evidence of retroperitoneal hemorrhage or perivascular hemorrhage of the right iliac system, aorta, or right renal artery. No lymphadenopathy. Reproductive: Crescent shaped low-density structure posterior to the pubic symphysis, intimately associated with the anterior urethra, most likely periurethral cyst. Other: Injection granulomas of the lower left abdominal wall. Musculoskeletal: No acute displaced fracture. Multilevel degenerative changes of the spine. Degenerative changes of the hips. IMPRESSION: CT is negative for evidence of retroperitoneal hemorrhage. Interval right renal artery stenting. Aortic atherosclerosis with surgical changes of prior open repair infrarenal abdominal aortic aneurysm. Aortic Atherosclerosis (ICD10-I70.0). Additional ancillary findings as above. Electronically Signed   By: Corrie Mckusick D.O.   On: 06/11/2021 12:53   PERIPHERAL VASCULAR CATHETERIZATION  Result Date: 06/11/2021 See surgical note for result.       Scheduled Meds:  aspirin EC  81 mg Oral Daily   atorvastatin  40 mg Oral q1800   cloNIDine  0.2 mg Transdermal Weekly   clopidogrel  75 mg Oral Daily   famotidine  10 mg Oral Daily   heparin  5,000 Units Subcutaneous Q8H   hydrochlorothiazide  12.5 mg Oral Daily   irbesartan  300 mg Oral Daily   labetalol  200 mg Oral BID   levothyroxine  75 mcg Oral Q0600   Continuous Infusions:     LOS: 3 days    Time  spent: 25 mins     Wyvonnia Dusky, MD [facility] Pager [phone]  If 7PM-7AM, please contact night-coverage 06/12/2021, 1:26 PM

## 2021-06-12 NOTE — Plan of Care (Signed)

## 2021-06-12 NOTE — Progress Notes (Signed)
[facility] Cardiology  Patient Description: Betty Nielsen is a 75 year old female with PMH significant for CAD, NSTEMI s/p PCI/DES to mid RCA (2018), h/o acute ischemic CVA, poorly controlled hypertension, hyperlipidemia, CKD stage IIIb, anemia, thyroid disease, PVD and obesity who was admitted to [facility] due to strokelike symptoms and poorly controlled hypertension thus cardiology was consulted.     SUBJECTIVE: The patient reports to be feeling well on today and she denies having any cardiac complaints. She states that she is recovering from the procedure well.   (06/11/2021): The patient reports that she is feeling better on today and she denies having any cardiac complaints. She denies having dyspnea at this time and states that she has been able to ambulate independently in the room without any issues.   (06/10/2021): The patient reports to be feeling well on today and states that she would like to be discharged. She denies having any cardiac symptoms at this time. The patient states that her blood pressure is "always around 200" at home and that she has been taking irbesartan, labetalol and clonidine. She states that she was prescribed nifedipine by her nephrologist, but did not want to take this medication; therefore, she did not pick it up from the pharmacy. She states that the clonidine causes her to having "congestion" and thus she would eventually like to be weaned off this medication also.   OBJECTIVE: The patient is 1 day s/p stent placement to the right renal artery and she appears significantly improved on today; furthermore,  her blood pressure appears to be responding well to the procedure. Her Most recent documented bp from 0224 am is 160/67. She continues to be in NSR on the telemetry monitor, she has normal effort of breathing and is resting comfortably in bed.   (06/11/2021): The patient appear well on today, she continues to be hypertensive with a systolic bp above [id number]. The patient's bedside  telemetry monitor reveals SR. She is resting comfortably on the side of her bed in NAD. The renal US was concerning for RAS, thus the patient is schedule to undergo renal angiogram via Vascular surgery on today.   (06/10/2021):  The patient appears fairly well on today, however, she continues to have a systolic blood pressure in the 200's. She is asymptomatic and all other vss are normal. Bedside telemetry reveals NSR with HR in the 60's. It may be that this patient has lived with this high blood pressure so long that a blood pressure within the normal range causes her to have complications with dizziness. The patient is sitting in the chair at this time, resting comfortably and is in NAD. The patient was thoroughly educated on her current condition and was informed that she does have the right to leave AMA; however, it would be beneficial for her to stay because of the uncontrolled blood pressure. She verbalized understanding of all information.   Vitals:   06/11/21 1937 06/11/21 1959 06/11/21 2338 06/12/21 0224  BP: (!) 207/81 (!) 196/84 (!) 167/88 (!) 160/67  Pulse: 62 63 68 60  Resp:  '18 18 18  '$ Temp: 98.3 F (36.8 C) 98 F (36.7 C) 98.6 F (37 C) 98.1 F (36.7 C)  TempSrc: Oral     SpO2: 100% 100% 98% 99%  Weight:      Height:         Intake/Output Summary (Last 24 hours) at 06/12/2021 1136 Last data filed at 06/12/2021 1015 Gross per 24 hour  Intake 840 ml  Output --  Net 840 ml      PHYSICAL EXAM  General: Well developed, well nourished, in no acute distress HEENT:  Normocephalic and atramatic. PERRL Neck:   No JVD.  Lungs: Clear bilaterally to auscultation, chest expansion symmetrical, normal effort of breathing, no wheezes, rales or rhonchi Heart: HRRR . Normal S1 and S2 without gallops or murmurs.  Abdomen: Bowel sounds are positive, abdomen soft and non-tender  Msk:  Normal strength and tone for age. Extremities: No clubbing, cyanosis or edema.   Neuro: Alert and  oriented X 3. Psych:  Good affect, responds appropriately   LABS: Basic Metabolic Panel: Recent Labs    06/10/21 0510 06/11/21 0548 06/12/21 0802  NA 132* 130* 130*  K 4.6 4.3 4.8  CL 99 97* 98  CO2 '26 24 23  '$ GLUCOSE 96 92 91  BUN 30* 31* 41*  CREATININE 2.31* 2.16* 4.04*  CALCIUM 8.9 9.2 9.0  MG 2.1  --   --   PHOS 3.8  --   --    Liver Function Tests: Recent Labs    06/10/21 0510  ALBUMIN 3.2*   No results for input(s): LIPASE, AMYLASE in the last 72 hours. CBC: Recent Labs    06/11/21 0548 06/12/21 0802  WBC 7.2 9.5  HGB 9.6* 10.3*  HCT 28.6* 30.7*  MCV 90.8 92.2  PLT 230 224   Cardiac Enzymes: No results for input(s): CKTOTAL, CKMB, CKMBINDEX, TROPONINI in the last 72 hours. BNP: Invalid input(s): POCBNP D-Dimer: No results for input(s): DDIMER in the last 72 hours. Hemoglobin A1C: No results for input(s): HGBA1C in the last 72 hours. Fasting Lipid Panel: No results for input(s): CHOL, HDL, LDLCALC, TRIG, CHOLHDL, LDLDIRECT in the last 72 hours. Thyroid Function Tests: No results for input(s): TSH, T4TOTAL, T3FREE, THYROIDAB in the last 72 hours.  Invalid input(s): FREET3 Anemia Panel: No results for input(s): VITAMINB12, FOLATE, FERRITIN, TIBC, IRON, RETICCTPCT in the last 72 hours.  CT ABDOMEN PELVIS WO CONTRAST  Result Date: 06/11/2021 CLINICAL DATA:  75 year old female with a history of possible retroperitoneal hematoma EXAM: CT ABDOMEN AND PELVIS WITHOUT CONTRAST TECHNIQUE: Multidetector CT imaging of the abdomen and pelvis was performed following the standard protocol without IV contrast. COMPARISON:  CT 08/21/2018, 11/19/2014 FINDINGS: Lower chest: No acute finding of the lower chest. Similar appearance of asymmetric elevation of the left hemidiaphragm. Hepatobiliary: Unremarkable liver.  Unremarkable gallbladder. Pancreas: Unremarkable Spleen: Unremarkable Adrenals/Urinary Tract: Bilateral adrenal glands unremarkable. Right kidney demonstrates  persisting nephrogram, compatible with recent angiogram. No hydronephrosis. Early contrast excretion into the collecting system. Multiple low-density lesions of varying sizes, most compatible with cysts and similar to the comparison CT. Redemonstration of significant cortical thinning of the left kidney with relatively asymmetric appearance of the persisting nephrogram. Contrast present within the left collecting system compatible with early excretion. Multiple low-density cystic lesions within the left kidney, most likely simple cysts. Stomach/Bowel: Unremarkable appearance of the stomach. Unremarkable appearance of small bowel. No abnormal distension. No focal wall thickening. Large stool burden with formed stool throughout the length of the majority of the colon. No focal inflammatory changes. Diverticular disease of the left colon. Vascular/Lymphatic: Redemonstration atherosclerotic changes of the lower thoracic and of the abdominal aorta. 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 Atherosclerotic changes of the bilateral iliac system. Interval right renal artery stent placement. Changes of recent right common femoral artery access with no local hematoma. No evidence of retroperitoneal hemorrhage or perivascular hemorrhage of the right iliac system, aorta,  or right renal artery. No lymphadenopathy. Reproductive: Crescent shaped low-density structure posterior to the pubic symphysis, intimately associated with the anterior urethra, most likely periurethral cyst. Other: Injection granulomas of the lower left abdominal wall. Musculoskeletal: No acute displaced fracture. Multilevel degenerative changes of the spine. Degenerative changes of the hips. IMPRESSION: CT is negative for evidence of retroperitoneal hemorrhage. Interval right renal artery stenting. Aortic atherosclerosis with surgical changes of prior open repair infrarenal abdominal aortic aneurysm. Aortic  Atherosclerosis (ICD10-I70.0). Additional ancillary findings as above. Electronically Signed   By: Corrie Mckusick D.O.   On: 06/11/2021 12:53   PERIPHERAL VASCULAR CATHETERIZATION  Result Date: 06/11/2021 See surgical note for result.      TELEMETRY: NSR  ASSESSMENT AND PLAN:  Principal Problem:   CVA (cerebral vascular accident) ([place]) Active Problems:   Hypertension, benign   Hypothyroidism, postsurgical   CKD (chronic kidney disease), stage III (HCC)   Hyperlipidemia   Chronic hyponatremia   Anemia in chronic kidney disease   Left upper extremity numbness   Left facial numbness   Elevated serum creatinine   Accelerated hypertension    Syncope, possibly due to rapid decrease in blood pressure, in addition to patient not being accustomed to a blood pressure that is within normal range  ECG was unremarkable at that time.   High-sensitivity troponin was unremarkable. Recommend continuous cardiac monitoring until discharge. Recommend monitoring blood pressure closely per protocol. Recommend ambulating with assistance and changing positions slowly.   2. Poorly controlled blood pressure likely secondary to Right renal artery stenosis s/p stent placement on 06/11/2021, patient's most recent documented bp is 160/67 Continue labetalol '200mg'$  twice daily per at home dose. Continue clonidine patch.  Continue hydrochlorothiazide to 12.5 mg once daily. Right RAS s/p stent placement perfomred by Vascular on 06/11/2021. Recovering well.  Low-sodium, heart healthy diet recommended at this time. Strict intake and output.  Patient is cleared for discharge from a Cardiology perspective. She should follow up with our office in 10 days as outpatient.    3. History of CVA with current strokelike symptoms Recommend careful blood pressure control as the patient's history of CVA indicates ischemic etiology. Recommend continuing Plavix for antiplatelet therapy.   4. Hyperlipidemia, reasonably  controlled Recommend continuing atorvastatin therapy.   5. CKD stage IIIb, fairly stable Recommend trending CMP. RAS likely contributing factor.    --Patient is cleared for discharge from a Cardiology perspective. She should follow up with our office in 10 days as outpatient. --   Gladstone Pih, ACNPC-AG  06/12/2021 11:36 AM

## 2021-06-13 DIAGNOSIS — N179 Acute kidney failure, unspecified: Secondary | ICD-10-CM | POA: Diagnosis not present

## 2021-06-13 DIAGNOSIS — I701 Atherosclerosis of renal artery: Secondary | ICD-10-CM | POA: Diagnosis not present

## 2021-06-13 DIAGNOSIS — I10 Essential (primary) hypertension: Secondary | ICD-10-CM | POA: Diagnosis not present

## 2021-06-13 LAB — BASIC METABOLIC PANEL
Anion gap: 13 (ref 5–15)
BUN: 48 mg/dL — ABNORMAL HIGH (ref 8–23)
CO2: 20 mmol/L — ABNORMAL LOW (ref 22–32)
Calcium: 8.3 mg/dL — ABNORMAL LOW (ref 8.9–10.3)
Chloride: 96 mmol/L — ABNORMAL LOW (ref 98–111)
Creatinine, Ser: 5.05 mg/dL — ABNORMAL HIGH (ref 0.44–1.00)
GFR, Estimated: 8 mL/min — ABNORMAL LOW (ref >60)
Glucose, Bld: 88 mg/dL (ref 70–99)
Potassium: 4.4 mmol/L (ref 3.5–5.1)
Sodium: 129 mmol/L — ABNORMAL LOW (ref 135–145)

## 2021-06-13 LAB — CBC
HCT: 27.3 % — ABNORMAL LOW (ref 36.0–46.0)
Hemoglobin: 8.9 g/dL — ABNORMAL LOW (ref 12.0–15.0)
MCH: 29.8 pg (ref 26.0–34.0)
MCHC: 32.6 g/dL (ref 30.0–36.0)
MCV: 91.3 fL (ref 80.0–100.0)
Platelets: 200 10*3/uL (ref 150–400)
RBC: 2.99 MIL/uL — ABNORMAL LOW (ref 3.87–5.11)
RDW: 12.5 % (ref 11.5–15.5)
WBC: 12.4 10*3/uL — ABNORMAL HIGH (ref 4.0–10.5)
nRBC: 0 % (ref 0.0–0.2)

## 2021-06-13 NOTE — Consult Note (Signed)
[facility] CONSULT NOTE    Date: 06/13/2021                  Patient Name:  Betty Nielsen  MRN: [id number]  DOB: October 02, 1945  Age / Sex: 75 y.o., female         PCP: Steele Sizer, MD                 Service Requesting Consult: Medicine                  Reason for Consult: Acute kidney injury             History of Present Illness: Patient is a 75 y.o. female with a PMHx of poorly controlled hypertension, coronary artery disease, chronic kidney disease stage IIIb who has been followed up in our office with Dr. Juleen China.  A renal evaluation was requested today secondary to worsening renal insufficiency. She was initially admitted with history of cerebrovascular accident and also was found to have accelerated hypertension.  She was also evaluated by vascular surgery and had a renal arterial Doppler studies done showing bilateral renal artery stenosis.  Subsequently she had a renal angiogram done showing significant bilateral renal artery stenosis but the right renal artery was amenable for stenting.  She had a baseline creatinine of about 1.9 on admission which has now gone up to 5.0 today.  Obviously she received IV contrast during the angiogram.  Its not clear how much contrast she received.  She is now started on IV fluids with isotonic saline at 75 cc an hour. She also was on ARB until 2 days ago.  She is presently on clonidine and labetalol.   Medications: Outpatient medications: Medications Prior to Admission  Medication Sig Dispense Refill Last Dose   atorvastatin (LIPITOR) 40 MG tablet Take 1 tablet (40 mg total) by mouth daily at 6 PM. Cholesterol medication. 90 tablet 1 06/06/2021   cetirizine (ZYRTEC) 10 MG tablet Take 1 tablet (10 mg total) by mouth daily as needed. PRN only   06/06/2021   Cholecalciferol 25 MCG (1000 UT) tablet Take 1,000 Units by mouth daily.    06/06/2021   clopidogrel (PLAVIX) 75 MG tablet TAKE 1 TABLET BY MOUTH ONCE DAILY. BLOOD  THINNER TO HELP REDUCE STROKE RISK 90 tablet 1 06/06/2021   levothyroxine (SYNTHROID) 75 MCG tablet Take 1 tablet (75 mcg total) by mouth daily before breakfast. 90 tablet 1 06/06/2021   Omega-3 Fatty Acids (FISH OIL) 1000 MG CPDR Take 2 capsules by mouth daily.   06/06/2021   montelukast (SINGULAIR) 10 MG tablet Take 1 tablet (10 mg total) by mouth at bedtime. (Patient not taking: Reported on 06/06/2021) 30 tablet 3 Not Taking   nitroGLYCERIN (NITROSTAT) 0.4 MG SL tablet Place under the tongue.   prn at prn    Current medications: Current Facility-Administered Medications  Medication Dose Route Frequency Provider Last Rate Last Admin   0.9 %  sodium chloride infusion   Intravenous Continuous Wyvonnia Dusky, MD 75 mL/hr at 06/13/21 0907 Rate Change at 06/13/21 [id number]   acetaminophen (TYLENOL) tablet 650 mg  650 mg Oral Q6H PRN Algernon Huxley, MD   650 mg at 06/12/21 1525   Or   acetaminophen (TYLENOL) suppository 650 mg  650 mg Rectal Q6H PRN Algernon Huxley, MD       ALPRAZolam Duanne Moron) tablet 0.25 mg  0.25 mg Oral BID PRN Algernon Huxley, MD   0.25 mg  at 06/11/21 2257   alum & mag hydroxide-simeth (MAALOX/MYLANTA) 200-200-20 MG/5ML suspension 30 mL  30 mL Oral Q8H PRN Algernon Huxley, MD   30 mL at 06/08/21 2204   aspirin EC tablet 81 mg  81 mg Oral Daily Algernon Huxley, MD   81 mg at 06/13/21 0814   atorvastatin (LIPITOR) tablet 40 mg  40 mg Oral q1800 Algernon Huxley, MD   40 mg at 06/12/21 1754   cloNIDine (CATAPRES - Dosed in mg/24 hr) patch 0.2 mg  0.2 mg Transdermal Weekly Algernon Huxley, MD   0.2 mg at 06/10/21 1826   clopidogrel (PLAVIX) tablet 75 mg  75 mg Oral Daily Algernon Huxley, MD   75 mg at 06/13/21 0814   famotidine (PEPCID) tablet 10 mg  10 mg Oral Daily Algernon Huxley, MD   10 mg at 06/13/21 0814   fentaNYL (SUBLIMAZE) injection 6.5 mcg  6.5 mcg Intravenous Q2H PRN Sharion Settler, NP   6.5 mcg at 06/11/21 1959   heparin injection 5,000 Units  5,000 Units Subcutaneous Q8H Algernon Huxley, MD   5,000  Units at 06/13/21 0535   labetalol (NORMODYNE) tablet 200 mg  200 mg Oral BID Algernon Huxley, MD   200 mg at 06/13/21 0814   levothyroxine (SYNTHROID) tablet 75 mcg  75 mcg Oral Q0600 Algernon Huxley, MD   75 mcg at 06/13/21 0534   ondansetron (ZOFRAN) injection 4 mg  4 mg Intravenous Q6H PRN Algernon Huxley, MD   4 mg at 06/09/21 2117      Allergies: Allergies  Allergen Reactions   Ace Inhibitors Cough   Ciprofloxacin Other (See Comments)    unknown   Citalopram     hyponatremia   Hydrocodone-Acetaminophen Other (See Comments)    unknown   Norvasc [Amlodipine Besylate]     Pruritus    Pantoprazole Sodium Other (See Comments)    Cramps   Pravastatin Other (See Comments)    Stomach cramps   Sulfa Antibiotics     unknown   Clonidine Other (See Comments)    Per patient told by allergist that would be best to stop but nephrologist recommended her to stay on medication       Past Medical History: Past Medical History:  Diagnosis Date   Arthrosis of knee    Chronic kidney disease    Hyperlipidemia    Hypertension    Loss of hearing    Metabolic syndrome    Plantar fasciitis    Renal insufficiency      Past Surgical History: Past Surgical History:  Procedure Laterality Date   ABDOMINAL AORTIC ANEURYSM REPAIR     ABDOMINAL HYSTERECTOMY     APPENDECTOMY     CORONARY STENT INTERVENTION N/A 10/26/2016   Procedure: Coronary Stent Intervention;  Surgeon: Yolonda Kida, MD;  Location: [facility];  Service: Cardiovascular;  Laterality: N/A;   HERNIA REPAIR     INNER EAR SURGERY     pt not sure of type   LEFT HEART CATH AND CORONARY ANGIOGRAPHY N/A 10/26/2016   Procedure: Left Heart Cath and Coronary Angiography;  Surgeon: Teodoro Spray, MD;  Location: [facility];  Service: Cardiovascular;  Laterality: N/A;   RENAL ANGIOGRAPHY N/A 06/11/2021   Procedure: RENAL ANGIOGRAPHY;  Surgeon: Algernon Huxley, MD;  Location: [facility];  Service: Cardiovascular;   Laterality: N/A;   THYROIDECTOMY       Family History: Family  History  Problem Relation Age of Onset   Healthy Mother    Cerebral aneurysm Father      Social History: Social History   Socioeconomic History   Marital status: Widowed    Spouse name: Not on file   Number of children: 1   Years of education: GED   Highest education level: 10th grade  Occupational History   Occupation: Retired  Tobacco Use   Smoking status: Former    Packs/day: 0.50    Years: 20.00    Pack years: 10.00    Types: Cigarettes    Quit date: 2000    Years since quitting: 22.7   Smokeless tobacco: Never   Tobacco comments:    smoking cessation materials not required  Vaping Use   Vaping Use: Never used  Substance and Sexual Activity   Alcohol use: No    Alcohol/week: 0.0 standard drinks   Drug use: No   Sexual activity: Not Currently  Other Topics Concern   Not on file  Social History Narrative   Lives alone   Retired from working a [employer] Strain: Not on Art therapist Insecurity: Not on file  Transportation Needs: Not on file  Physical Activity: Not on file  Stress: Not on file  Social Connections: Not on file  Intimate Partner Violence: Not on file     Review of Systems: As per HPI  Vital Signs: Blood pressure (!) 144/65, pulse 66, temperature 98.6 F (37 C), temperature source Oral, resp. rate 18, height '5\' 5"'$  (1.651 m), weight 81.2 kg, SpO2 100 %.  Weight trends: Filed Weights   06/11/21 0516 06/11/21 1001 06/13/21 0500  Weight: 80.8 kg 81.2 kg 81.2 kg    Physical Exam: Physical Exam: General:  No acute distress  Head:  Normocephalic, atraumatic. Moist oral mucosal membranes  Eyes:  Anicteric  Neck:  Supple  Lungs:   Clear to auscultation, normal effort  Heart:  S1S2 no rubs  Abdomen:   Soft, nontender, bowel sounds present  Extremities:  peripheral edema.  Neurologic:  Awake, alert, following commands  Skin:   No lesions  Access:     Lab results:  Basic Metabolic Panel: Recent Labs  Lab 06/06/21 2345 06/07/21 0740 06/08/21 0707 06/09/21 0505 06/10/21 0510 06/11/21 0548 06/12/21 0802 06/13/21 0642  NA  --  132* 131* 133* 132* 130* 130* 129*  K  --  4.7 4.5 4.2 4.6 4.3 4.8 4.4  CL  --  99 98 99 99 97* 98 96*  CO2  --  '23 23 24 26 24 23 '$ 20*  GLUCOSE  --  109* 102* 92 96 92 91 88  BUN  --  24* 26* 30* 30* 31* 41* 48*  CREATININE  --  1.87* 2.51* 2.55* 2.31* 2.16* 4.04* 5.05*  CALCIUM  --  9.6 9.6 9.2 8.9 9.2 9.0 8.3*  MG 2.0 2.0 1.9 2.1 2.1  --   --   --   PHOS  --   --  4.1 4.4 3.8  --   --   --     CBC: Recent Labs  Lab 06/06/21 2110 06/07/21 0740 06/09/21 0505 06/10/21 0510 06/11/21 0548 06/12/21 0802 06/13/21 0642  WBC 5.8   < > 6.7 6.7 7.2 9.5 12.4*  NEUTROABS 2.5  --   --   --   --   --   --   HGB 10.0*   < > 10.4*  9.5* 9.6* 10.3* 8.9*  HCT 29.9*   < > 30.5* 29.1* 28.6* 30.7* 27.3*  MCV 94.0   < > 93.0 91.8 90.8 92.2 91.3  PLT 213   < > 242 229 230 224 200   < > = values in this interval not displayed.    Microbiology: Results for orders placed or performed during the hospital encounter of 06/06/21  Resp Panel by RT-PCR (Flu A&B, Covid) Nasopharyngeal Swab     Status: None   Collection Time: 06/06/21  9:57 PM   Specimen: Nasopharyngeal Swab; Nasopharyngeal(NP) swabs in vial transport medium  Result Value Ref Range Status   SARS Coronavirus 2 by RT PCR NEGATIVE NEGATIVE Final    Comment: (NOTE) SARS-CoV-2 target nucleic acids are NOT DETECTED.  The SARS-CoV-2 RNA is generally detectable in upper respiratory specimens during the acute phase of infection. The lowest concentration of SARS-CoV-2 viral copies this assay can detect is 138 copies/mL. A negative result does not preclude SARS-Cov-2 infection and should not be used as the sole basis for treatment or other patient management decisions. A negative result may occur with  improper specimen  collection/handling, submission of specimen other than nasopharyngeal swab, presence of viral mutation(s) within the areas targeted by this assay, and inadequate number of viral copies(<138 copies/mL). A negative result must be combined with clinical observations, patient history, and epidemiological information. The expected result is Negative.  Fact Sheet for Patients:  EntrepreneurPulse.com.au  Fact Sheet for Healthcare Providers:  IncredibleEmployment.be  This test is no t yet approved or cleared by the [place] FDA and  has been authorized for detection and/or diagnosis of SARS-CoV-2 by FDA under an Emergency Use Authorization (EUA). This EUA will remain  in effect (meaning this test can be used) for the duration of the COVID-19 declaration under Section 564(b)(1) of the Act, 21 U.S.C.section 360bbb-3(b)(1), unless the authorization is terminated  or revoked sooner.       Influenza A by PCR NEGATIVE NEGATIVE Final   Influenza B by PCR NEGATIVE NEGATIVE Final    Comment: (NOTE) The Xpert Xpress SARS-CoV-2/FLU/RSV plus assay is intended as an aid in the diagnosis of influenza from Nasopharyngeal swab specimens and should not be used as a sole basis for treatment. Nasal washings and aspirates are unacceptable for Xpert Xpress SARS-CoV-2/FLU/RSV testing.  Fact Sheet for Patients: EntrepreneurPulse.com.au  Fact Sheet for Healthcare Providers: IncredibleEmployment.be  This test is not yet approved or cleared by the [place] FDA and has been authorized for detection and/or diagnosis of SARS-CoV-2 by FDA under an Emergency Use Authorization (EUA). This EUA will remain in effect (meaning this test can be used) for the duration of the COVID-19 declaration under Section 564(b)(1) of the Act, 21 U.S.C. section 360bbb-3(b)(1), unless the authorization is terminated or revoked.  Performed at [facility], [address] [place], [place] [address]     Urinalysis: No results for input(s): COLORURINE, LABSPEC, PHURINE, GLUCOSEU, HGBUR, BILIRUBINUR, KETONESUR, PROTEINUR, UROBILINOGEN, NITRITE, LEUKOCYTESUR in the last 72 hours.  Invalid input(s): APPERANCEUR   Imaging:  CLINICAL DATA:  Hypertension   EXAM: RENAL/URINARY TRACT ULTRASOUND   RENAL DUPLEX DOPPLER ULTRASOUND   COMPARISON:  CTA December 2019   FINDINGS: Right Kidney:   Length: 12 cm. Echogenicity within normal limits. No hydronephrosis. A couple of renal cysts are again seen with the largest measuring up to 5 cm.   Left Kidney:   Length: 13.2 cm. Echogenicity within normal limits. No hydronephrosis. Numerous cysts are again seen,  with the largest measuring up to 6.5 cm.   Bladder:  Within normal limits.   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   IMPRESSION: 1. By Doppler velocity criteria alone, there are no findings on this exam to suggest significant renal artery stenosis. 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 Findings on ultrasound therefore are felt to be under representative. Further evaluation could be done  with either repeat CTA, or referral to a service such as interventional radiology for consideration of catheter directed renal angiography and potential treatment. 2. 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 Recommend follow-up every 12 months and vascular consultation. This recommendation follows ACR consensus guidelines: White Paper of the ACR Incidental Findings Committee II on Vascular Findings. J Am Coll Radiol 2013; 10:789-794. 3. Bilateral simple renal cysts, grossly stable.    Electronically Signed   By: Albin Felling M.D.   On: 06/10/2021 10:48     Assessment & Plan:   Principal Problem:   CVA (cerebral vascular accident) ([place]) Active Problems:   Hypertension, benign   Hypothyroidism, postsurgical   CKD (chronic kidney disease), stage III ([facility])   Hyperlipidemia   Chronic hyponatremia   Anemia in chronic kidney disease   Left upper extremity numbness   Left facial numbness   Elevated serum creatinine   Accelerated hypertension  75 y.o female with history of poorly controlled hypertension, coronary artery disease, peripheral vascular disease, chronic kidney disease stage IIIb with a GFR of about 44 cc now admitted with history of cerebrovascular accident and accelerated hypertension.  She had renal artery stenosis bilaterally and had a stent placement in the right renal artery 2 days ago.  I would like to continue the IV fluids with normal saline at 75 cc an hour for another day.  #1: Acute kidney injury: Patient now has acute kidney injury on the top of chronic kidney disease.  This is most likely due to acute tubular necrosis secondary to ischemia.  This is probably complicated by IV contrast that the patient received during the procedure.  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  Patient is followed up with Dr.  Juleen China.  #3: Renal artery stenosis: Patient has bilateral renal artery stenosis.  Patient had right renal artery stent placed.  The left renal artery was not amenable for intervention.  #4: Hypertension: Patient is currently on labetalol and clonidine.  Blood  pressure is moderately controlled.  We will add a calcium channel blocker with amlodipine at 5 mg daily.  #5: Anemia: Patient has chronic anemia secondary to chronic kidney disease.  Patient needs oral iron administration and subsequently needs erythropoietin supplementation.  There is no acute need for dialysis intervention.  I will continue to follow the patient closely during the hospitalization and give recommendations as required.   LOS: [place], MD [facility]. 9/24/202212:22 PM

## 2021-06-13 NOTE — Progress Notes (Signed)
PROGRESS NOTE    Betty Nielsen  [id number] DOB: Aug 19, 1946 DOA: 06/06/2021 PCP: Steele Sizer, MD   Assessment & Plan:   Principal Problem:   CVA (cerebral vascular accident) [facility]) Active Problems:   Hypertension, benign   Hypothyroidism, postsurgical   CKD (chronic kidney disease), stage III (HCC)   Hyperlipidemia   Chronic hyponatremia   Anemia in chronic kidney disease   Left upper extremity numbness   Left facial numbness   Elevated serum creatinine   Accelerated hypertension  Syncopal episode: with some LOC but no fall.  Likely due to medication    HTN: uncontrolled but improved after stent placed in renal artery. Continue on labetalol, clonidine patch. Hold HCTZ.   Right renal artery stenosis: as per vascular surg. 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 Will need repeat imaging in 12 months & will need to f/u outpatient w/ vascular surg    LUE/left face paresthesia: etiology unclear, possibly due to uncontrolled hypertension. CT head, MRI brain, MRA head, TTE and bilateral carotid US without significant finding. Symptoms resolved.   AKI on CKDIIIb: Cr is trending up again today. Continue on IVFs. Nephro consulted   Hyponatremia: labile. Will continue to monitor   Hx of right right basal ganglia CVA: continue on plavix, statin   HLD: continue on statin   Hypothyroidism: continue on home dose of levothyroxine   GERD: continue on pepcid   DVT prophylaxis: heparin  Code Status: full  Family Communication:  Disposition Plan: d/c back home   Level of care: Progressive Cardiac  Status is: Inpatient  Remains inpatient appropriate because:Ongoing diagnostic testing needed not appropriate for outpatient work up, IV treatments appropriate due to intensity of illness or inability to take PO, and Inpatient level of care appropriate due to severity of illness  Dispo: The  patient is from: Home              Anticipated d/c is to: Home              Patient currently is not medically stable to d/c.   Difficult to place patient : unclear        Consultants:  Cardio  Vascular surg Nephro   Procedures:   Antimicrobials:    Subjective: Pt c/o wanting to go home.   Objective: Vitals:   06/13/21 0355 06/13/21 0500 06/13/21 0736 06/13/21 1159  BP: (!) 146/69  (!) 168/77 (!) 144/65  Pulse: 75  72 66  Resp: '20  18 18  '$ Temp: 99.7 F (37.6 C)  98.6 F (37 C) 98.6 F (37 C)  TempSrc: Oral  Oral Oral  SpO2: 98%  98% 100%  Weight:  81.2 kg    Height:        Intake/Output Summary (Last 24 hours) at 06/13/2021 1246 Last data filed at 06/13/2021 0950 Gross per 24 hour  Intake 1228.25 ml  Output 600 ml  Net 628.25 ml   Filed Weights   06/11/21 0516 06/11/21 1001 06/13/21 0500  Weight: 80.8 kg 81.2 kg 81.2 kg    Examination:  General exam: Appears calm & comfortable Respiratory system: Clear breath sounds b/l Cardiovascular system: S1 & S2+. No rubs or clicks  Gastrointestinal system: Abd is soft, NT, obese & normal bowel sounds  Central nervous system: Alert and oriented. Moves all extremities  Psychiatry: Judgement and insight appear normal. Flat mood and affect  Data Reviewed: I have personally reviewed following labs and imaging studies  CBC: Recent Labs  Lab 06/06/21 2110 06/07/21 0740 06/09/21 0505 06/10/21 0510 06/11/21 0548 06/12/21 0802 06/13/21 0642  WBC 5.8   < > 6.7 6.7 7.2 9.5 12.4*  NEUTROABS 2.5  --   --   --   --   --   --   HGB 10.0*   < > 10.4* 9.5* 9.6* 10.3* 8.9*  HCT 29.9*   < > 30.5* 29.1* 28.6* 30.7* 27.3*  MCV 94.0   < > 93.0 91.8 90.8 92.2 91.3  PLT 213   < > 242 229 230 224 200   < > = values in this interval not displayed.   Basic Metabolic Panel: Recent Labs  Lab 06/06/21 2345 06/07/21 0740 06/08/21 [id number] 06/09/21 0505 06/10/21 0510 06/11/21 0548 06/12/21 0802 06/13/21 0642  NA  --   132* 131* 133* 132* 130* 130* 129*  K  --  4.7 4.5 4.2 4.6 4.3 4.8 4.4  CL  --  99 98 99 99 97* 98 96*  CO2  --  '23 23 24 26 24 23 '$ 20*  GLUCOSE  --  109* 102* 92 96 92 91 88  BUN  --  24* 26* 30* 30* 31* 41* 48*  CREATININE  --  1.87* 2.51* 2.55* 2.31* 2.16* 4.04* 5.05*  CALCIUM  --  9.6 9.6 9.2 8.9 9.2 9.0 8.3*  MG 2.0 2.0 1.9 2.1 2.1  --   --   --   PHOS  --   --  4.1 4.4 3.8  --   --   --    GFR: Estimated Creatinine Clearance: 10.1 mL/min (A) (by C-G formula based on SCr of 5.05 mg/dL (H)). Liver Function Tests: Recent Labs  Lab 06/06/21 2110 06/07/21 0740 06/08/21 0707 06/09/21 0505 06/10/21 0510  AST 20 23  --   --   --   ALT 12 12  --   --   --   ALKPHOS 89 84  --   --   --   BILITOT 0.6 0.7  --   --   --   PROT 6.6 6.9  --   --   --   ALBUMIN 3.7 3.8 3.9 3.4* 3.2*   No results for input(s): LIPASE, AMYLASE in the last 168 hours. No results for input(s): AMMONIA in the last 168 hours. Coagulation Profile: Recent Labs  Lab 06/06/21 2110  INR 1.0   Cardiac Enzymes: No results for input(s): CKTOTAL, CKMB, CKMBINDEX, TROPONINI in the last 168 hours. BNP (last 3 results) No results for input(s): PROBNP in the last 8760 hours. HbA1C: No results for input(s): HGBA1C in the last 72 hours.  CBG: Recent Labs  Lab 06/06/21 2110 06/09/21 1015  GLUCAP 114* 133*   Lipid Profile: No results for input(s): CHOL, HDL, LDLCALC, TRIG, CHOLHDL, LDLDIRECT in the last 72 hours.  Thyroid Function Tests: No results for input(s): TSH, T4TOTAL, FREET4, T3FREE, THYROIDAB in the last 72 hours. Anemia Panel: No results for input(s): VITAMINB12, FOLATE, FERRITIN, TIBC, IRON, RETICCTPCT in the last 72 hours. Sepsis Labs: No results for input(s): PROCALCITON, LATICACIDVEN in the last 168 hours.  Recent Results (from the past 240 hour(s))  Resp Panel by RT-PCR (Flu A&B, Covid) Nasopharyngeal Swab     Status: None   Collection Time: 06/06/21  9:57 PM   Specimen: Nasopharyngeal  Swab; Nasopharyngeal(NP) swabs in vial transport medium  Result Value Ref Range Status   SARS Coronavirus  2 by RT PCR NEGATIVE NEGATIVE Final    Comment: (NOTE) SARS-CoV-2 target nucleic acids are NOT DETECTED.  The SARS-CoV-2 RNA is generally detectable in upper respiratory specimens during the acute phase of infection. The lowest concentration of SARS-CoV-2 viral copies this assay can detect is 138 copies/mL. A negative result does not preclude SARS-Cov-2 infection and should not be used as the sole basis for treatment or other patient management decisions. A negative result may occur with  improper specimen collection/handling, submission of specimen other than nasopharyngeal swab, presence of viral mutation(s) within the areas targeted by this assay, and inadequate number of viral copies(<138 copies/mL). A negative result must be combined with clinical observations, patient history, and epidemiological information. The expected result is Negative.  Fact Sheet for Patients:  EntrepreneurPulse.com.au  Fact Sheet for Healthcare Providers:  IncredibleEmployment.be  This test is no t yet approved or cleared by the [place] FDA and  has been authorized for detection and/or diagnosis of SARS-CoV-2 by FDA under an Emergency Use Authorization (EUA). This EUA will remain  in effect (meaning this test can be used) for the duration of the COVID-19 declaration under Section 564(b)(1) of the Act, 21 U.S.C.section 360bbb-3(b)(1), unless the authorization is terminated  or revoked sooner.       Influenza A by PCR NEGATIVE NEGATIVE Final   Influenza B by PCR NEGATIVE NEGATIVE Final    Comment: (NOTE) The Xpert Xpress SARS-CoV-2/FLU/RSV plus assay is intended as an aid in the diagnosis of influenza from Nasopharyngeal swab specimens and should not be used as a sole basis for treatment. Nasal washings and aspirates are unacceptable for Xpert Xpress  SARS-CoV-2/FLU/RSV testing.  Fact Sheet for Patients: EntrepreneurPulse.com.au  Fact Sheet for Healthcare Providers: IncredibleEmployment.be  This test is not yet approved or cleared by the [place] FDA and has been authorized for detection and/or diagnosis of SARS-CoV-2 by FDA under an Emergency Use Authorization (EUA). This EUA will remain in effect (meaning this test can be used) for the duration of the COVID-19 declaration under Section 564(b)(1) of the Act, 21 U.S.C. section 360bbb-3(b)(1), unless the authorization is terminated or revoked.  Performed at [facility], [address], [place], [place] [address]          Radiology Studies: No results found.      Scheduled Meds:  aspirin EC  81 mg Oral Daily   atorvastatin  40 mg Oral q1800   cloNIDine  0.2 mg Transdermal Weekly   clopidogrel  75 mg Oral Daily   famotidine  10 mg Oral Daily   heparin  5,000 Units Subcutaneous Q8H   labetalol  200 mg Oral BID   levothyroxine  75 mcg Oral Q0600   Continuous Infusions:  sodium chloride 75 mL/hr at 06/13/21 0907      LOS: 4 days    Time spent: 30 mins     Wyvonnia Dusky, MD [facility] Pager [phone]  If 7PM-7AM, please contact night-coverage 06/13/2021, 12:46 PM

## 2021-06-14 DIAGNOSIS — I10 Essential (primary) hypertension: Secondary | ICD-10-CM | POA: Diagnosis not present

## 2021-06-14 DIAGNOSIS — N179 Acute kidney failure, unspecified: Secondary | ICD-10-CM | POA: Diagnosis not present

## 2021-06-14 DIAGNOSIS — I701 Atherosclerosis of renal artery: Secondary | ICD-10-CM | POA: Diagnosis not present

## 2021-06-14 LAB — BASIC METABOLIC PANEL
Anion gap: 11 (ref 5–15)
BUN: 48 mg/dL — ABNORMAL HIGH (ref 8–23)
CO2: 19 mmol/L — ABNORMAL LOW (ref 22–32)
Calcium: 8.3 mg/dL — ABNORMAL LOW (ref 8.9–10.3)
Chloride: 99 mmol/L (ref 98–111)
Creatinine, Ser: 5.67 mg/dL — ABNORMAL HIGH (ref 0.44–1.00)
GFR, Estimated: 7 mL/min — ABNORMAL LOW (ref >60)
Glucose, Bld: 95 mg/dL (ref 70–99)
Potassium: 4.6 mmol/L (ref 3.5–5.1)
Sodium: 129 mmol/L — ABNORMAL LOW (ref 135–145)

## 2021-06-14 LAB — ALDOSTERONE + RENIN ACTIVITY W/ RATIO
ALDO / PRA Ratio: 24.1 (ref 0.0–30.0)
Aldosterone: 5.8 ng/dL (ref 0.0–30.0)
PRA LC/MS/MS: 0.241 ng/mL/hr (ref 0.167–5.380)

## 2021-06-14 LAB — CBC
HCT: 24.8 % — ABNORMAL LOW (ref 36.0–46.0)
Hemoglobin: 8.5 g/dL — ABNORMAL LOW (ref 12.0–15.0)
MCH: 32.1 pg (ref 26.0–34.0)
MCHC: 34.3 g/dL (ref 30.0–36.0)
MCV: 93.6 fL (ref 80.0–100.0)
Platelets: 187 10*3/uL (ref 150–400)
RBC: 2.65 MIL/uL — ABNORMAL LOW (ref 3.87–5.11)
RDW: 12.7 % (ref 11.5–15.5)
WBC: 9.7 10*3/uL (ref 4.0–10.5)
nRBC: 0 % (ref 0.0–0.2)

## 2021-06-14 LAB — METANEPHRINES, PLASMA
Metanephrine, Free: 35.9 pg/mL (ref 0.0–88.0)
Normetanephrine, Free: 227.5 pg/mL (ref 0.0–285.2)

## 2021-06-14 NOTE — Progress Notes (Signed)
PROGRESS NOTE    Betty Nielsen  [id number] DOB: 06-Oct-1945 DOA: 06/06/2021 PCP: Steele Sizer, MD   Assessment & Plan:   Principal Problem:   CVA (cerebral vascular accident) [facility]) Active Problems:   Hypertension, benign   Hypothyroidism, postsurgical   CKD (chronic kidney disease), stage III (HCC)   Hyperlipidemia   Chronic hyponatremia   Anemia in chronic kidney disease   Left upper extremity numbness   Left facial numbness   Elevated serum creatinine   Accelerated hypertension  Syncopal episode: with some LOC but no fall.  Likely due to medication    HTN: uncontrolled but improved after stent placed in renal artery. Continue on labetalol, clonidine patch. Hold HCTZ.   Right renal artery stenosis: as per vascular surg. 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 Will need repeat imaging in 12 months & will need to f/u outpatient w/ vascular surg    LUE/left face paresthesia: etiology unclear, possibly due to uncontrolled hypertension. CT head, MRI brain, MRA head, TTE and bilateral carotid US without significant finding. Symptoms resolved.   AKI on CKDIIIb: Cr is trending up daily. Continue on IVFs. Nephro following and recs apprec   Hyponatremia: resolved   Hx of right right basal ganglia CVA: continue on statin, plavix  HLD: continue on statin   Hypothyroidism: continue on home dose of levothyroxine   GERD: continue on pepcid    DVT prophylaxis: heparin  Code Status: full  Family Communication:  Disposition Plan: d/c back home   Level of care: Progressive Cardiac  Status is: Inpatient  Remains inpatient appropriate because:Ongoing diagnostic testing needed not appropriate for outpatient work up, IV treatments appropriate due to intensity of illness or inability to take PO, and Inpatient level of care appropriate due to severity of illness, Cr continues to rise despite  IVFs  Dispo: The patient is from: Home              Anticipated d/c is to: Home              Patient currently is not medically stable to d/c.   Difficult to place patient : unclear        Consultants:  Cardio  Vascular surg Nephro   Procedures:   Antimicrobials:    Subjective: Pt still wanting to go home. Pt denies any pain, shortness of breath, nausea.   Objective: Vitals:   06/14/21 0005 06/14/21 0502 06/14/21 1000 06/14/21 1300  BP: 133/66 (!) 163/82 (!) 155/80 (!) 145/70  Pulse: 64 72 74 67  Resp: '16 16 16 16  '$ Temp: 98.8 F (37.1 C) 98.7 F (37.1 C) 98.5 F (36.9 C) 98.7 F (37.1 C)  TempSrc: Oral     SpO2: 99% 98% 97% 96%  Weight:  86.6 kg    Height:        Intake/Output Summary (Last 24 hours) at 06/14/2021 1413 Last data filed at 06/14/2021 1340 Gross per 24 hour  Intake 2843.32 ml  Output 800 ml  Net 2043.32 ml   Filed Weights   06/11/21 1001 06/13/21 0500 06/14/21 0502  Weight: 81.2 kg 81.2 kg 86.6 kg    Examination:  General exam: Appears frustrated Respiratory system: Clear breath sounds b/l Cardiovascular system: S1/S2+. No rubs or clicks  Gastrointestinal system: Abd is soft, NT, obese & normal bowel sounds  Central nervous system: Alert and oriented. Moves all extremities  Psychiatry: Judgement and insight appear normal. Appears frustrated    Data Reviewed: I have personally reviewed following labs and imaging studies  CBC: Recent Labs  Lab 06/10/21 0510 06/11/21 0548 06/12/21 0802 06/13/21 0642 06/14/21 0651  WBC 6.7 7.2 9.5 12.4* 9.7  HGB 9.5* 9.6* 10.3* 8.9* 8.5*  HCT 29.1* 28.6* 30.7* 27.3* 24.8*  MCV 91.8 90.8 92.2 91.3 93.6  PLT 229 230 224 200 [id number]   Basic Metabolic Panel: Recent Labs  Lab 06/08/21 0707 06/09/21 0505 06/10/21 0510 06/11/21 0548 06/12/21 0802 06/13/21 0642 06/14/21 0651  NA 131* 133* 132* 130* 130* 129* 129*  K 4.5 4.2 4.6 4.3 4.8 4.4 4.6  CL 98 99 99 97* 98 96* 99  CO2 '23 24 26 24 23  '$ 20* 19*  GLUCOSE 102* 92 96 92 91 88 95  BUN 26* 30* 30* 31* 41* 48* 48*  CREATININE 2.51* 2.55* 2.31* 2.16* 4.04* 5.05* 5.67*  CALCIUM 9.6 9.2 8.9 9.2 9.0 8.3* 8.3*  MG 1.9 2.1 2.1  --   --   --   --   PHOS 4.1 4.4 3.8  --   --   --   --    GFR: Estimated Creatinine Clearance: 9.3 mL/min (A) (by C-G formula based on SCr of 5.67 mg/dL (H)). Liver Function Tests: Recent Labs  Lab 06/08/21 0707 06/09/21 0505 06/10/21 0510  ALBUMIN 3.9 3.4* 3.2*   No results for input(s): LIPASE, AMYLASE in the last 168 hours. No results for input(s): AMMONIA in the last 168 hours. Coagulation Profile: No results for input(s): INR, PROTIME in the last 168 hours.  Cardiac Enzymes: No results for input(s): CKTOTAL, CKMB, CKMBINDEX, TROPONINI in the last 168 hours. BNP (last 3 results) No results for input(s): PROBNP in the last 8760 hours. HbA1C: No results for input(s): HGBA1C in the last 72 hours.  CBG: Recent Labs  Lab 06/09/21 1015  GLUCAP 133*   Lipid Profile: No results for input(s): CHOL, HDL, LDLCALC, TRIG, CHOLHDL, LDLDIRECT in the last 72 hours.  Thyroid Function Tests: No results for input(s): TSH, T4TOTAL, FREET4, T3FREE, THYROIDAB in the last 72 hours. Anemia Panel: No results for input(s): VITAMINB12, FOLATE, FERRITIN, TIBC, IRON, RETICCTPCT in the last 72 hours. Sepsis Labs: No results for input(s): PROCALCITON, LATICACIDVEN in the last 168 hours.  Recent Results (from the past 240 hour(s))  Resp Panel by RT-PCR (Flu A&B, Covid) Nasopharyngeal Swab     Status: None   Collection Time: 06/06/21  9:57 PM   Specimen: Nasopharyngeal Swab; Nasopharyngeal(NP) swabs in vial transport medium  Result Value Ref Range Status   SARS Coronavirus 2 by RT PCR NEGATIVE NEGATIVE Final    Comment: (NOTE) SARS-CoV-2 target nucleic acids are NOT DETECTED.  The SARS-CoV-2 RNA is generally detectable in upper respiratory specimens during the acute phase of infection. The  lowest concentration of SARS-CoV-2 viral copies this assay can detect is 138 copies/mL. A negative result does not preclude SARS-Cov-2 infection and should not be used as the sole basis for treatment or other patient management decisions. A negative result may occur with  improper specimen collection/handling, submission of specimen other than nasopharyngeal swab, presence of viral mutation(s) within the areas targeted by this assay, and inadequate number of viral copies(<138 copies/mL). A negative result must be combined with clinical observations, patient history, and epidemiological information. The expected result is Negative.  Fact Sheet for Patients:  EntrepreneurPulse.com.au  Fact Sheet for Healthcare Providers:  IncredibleEmployment.be  This test is no t yet approved  or cleared by the [place] and  has been authorized for detection and/or diagnosis of SARS-CoV-2 by FDA under an Emergency Use Authorization (EUA). This EUA will remain  in effect (meaning this test can be used) for the duration of the COVID-19 declaration under Section 564(b)(1) of the Act, 21 U.S.C.section 360bbb-3(b)(1), unless the authorization is terminated  or revoked sooner.       Influenza A by PCR NEGATIVE NEGATIVE Final   Influenza B by PCR NEGATIVE NEGATIVE Final    Comment: (NOTE) The Xpert Xpress SARS-CoV-2/FLU/RSV plus assay is intended as an aid in the diagnosis of influenza from Nasopharyngeal swab specimens and should not be used as a sole basis for treatment. Nasal washings and aspirates are unacceptable for Xpert Xpress SARS-CoV-2/FLU/RSV testing.  Fact Sheet for Patients: EntrepreneurPulse.com.au  Fact Sheet for Healthcare Providers: IncredibleEmployment.be  This test is not yet approved or cleared by the [place] FDA and has been authorized for detection and/or diagnosis of SARS-CoV-2 by FDA under  an Emergency Use Authorization (EUA). This EUA will remain in effect (meaning this test can be used) for the duration of the COVID-19 declaration under Section 564(b)(1) of the Act, 21 U.S.C. section 360bbb-3(b)(1), unless the authorization is terminated or revoked.  Performed at [facility], [address], [place], [place] [address]          Radiology Studies: No results found.      Scheduled Meds:  aspirin EC  81 mg Oral Daily   atorvastatin  40 mg Oral q1800   cloNIDine  0.2 mg Transdermal Weekly   clopidogrel  75 mg Oral Daily   famotidine  10 mg Oral Daily   heparin  5,000 Units Subcutaneous Q8H   labetalol  200 mg Oral BID   levothyroxine  75 mcg Oral Q0600   Continuous Infusions:  sodium chloride 75 mL/hr at 06/13/21 2309      LOS: 5 days    Time spent: 20 mins     Wyvonnia Dusky, MD [facility] Pager [phone]  If 7PM-7AM, please contact night-coverage 06/14/2021, 2:13 PM

## 2021-06-14 NOTE — Progress Notes (Signed)
[facility]  PROGRESS NOTE   Subjective:   Out of bed to chair.  Denies any chest pain or shortness of breath. Wants to go home today because she cannot afford the 20% co-pays to the hospital and other physicians.  Objective:  Vital signs in last 24 hours:  Temp:  [98.5 F (36.9 C)-99.1 F (37.3 C)] 98.7 F (37.1 C) (09/25 1300) Pulse Rate:  [64-74] 67 (09/25 1300) Resp:  [15-17] 16 (09/25 1300) BP: (133-163)/(62-82) 145/70 (09/25 1300) SpO2:  [96 %-100 %] 96 % (09/25 1300) Weight:  [86.6 kg] 86.6 kg (09/25 0502)  Weight change: 5.406 kg Filed Weights   06/11/21 1001 06/13/21 0500 06/14/21 0502  Weight: 81.2 kg 81.2 kg 86.6 kg    Intake/Output: I/O last 3 completed shifts: In: 3531.1 [P.O.:1140; I.V.:2391.1] Out: 1300 [Urine:1300]   Intake/Output this shift:  Total I/O In: 360 [P.O.:360] Out: -   Physical Exam: General:  No acute distress  Head:  Normocephalic, atraumatic. Moist oral mucosal membranes  Eyes:  Anicteric  Neck:  Supple  Lungs:   Clear to auscultation, normal effort  Heart:  S1S2 no rubs  Abdomen:   Soft, nontender, bowel sounds present  Extremities:  peripheral edema.  Neurologic:  Awake, alert, following commands  Skin:  No lesions  Access:     Basic Metabolic Panel: Recent Labs  Lab 06/08/21 0707 06/09/21 0505 06/10/21 0510 06/11/21 0548 06/12/21 0802 06/13/21 0642 06/14/21 0651  NA 131* 133* 132* 130* 130* 129* 129*  K 4.5 4.2 4.6 4.3 4.8 4.4 4.6  CL 98 99 99 97* 98 96* 99  CO2 '23 24 26 24 23 '$ 20* 19*  GLUCOSE 102* 92 96 92 91 88 95  BUN 26* 30* 30* 31* 41* 48* 48*  CREATININE 2.51* 2.55* 2.31* 2.16* 4.04* 5.05* 5.67*  CALCIUM 9.6 9.2 8.9 9.2 9.0 8.3* 8.3*  MG 1.9 2.1 2.1  --   --   --   --   PHOS 4.1 4.4 3.8  --   --   --   --     CBC: Recent Labs  Lab 06/10/21 0510 06/11/21 0548 06/12/21 0802 06/13/21 0642 06/14/21 0651  WBC 6.7 7.2 9.5 12.4* 9.7  HGB 9.5* 9.6* 10.3* 8.9* 8.5*  HCT 29.1* 28.6* 30.7*  27.3* 24.8*  MCV 91.8 90.8 92.2 91.3 93.6  PLT 229 230 224 200 187     Urinalysis: No results for input(s): COLORURINE, LABSPEC, PHURINE, GLUCOSEU, HGBUR, BILIRUBINUR, KETONESUR, PROTEINUR, UROBILINOGEN, NITRITE, LEUKOCYTESUR in the last 72 hours.  Invalid input(s): APPERANCEUR    Imaging: No results found.   Medications:    sodium chloride 75 mL/hr at 06/13/21 2309    aspirin EC  81 mg Oral Daily   atorvastatin  40 mg Oral q1800   cloNIDine  0.2 mg Transdermal Weekly   clopidogrel  75 mg Oral Daily   famotidine  10 mg Oral Daily   heparin  5,000 Units Subcutaneous Q8H   labetalol  200 mg Oral BID   levothyroxine  75 mcg Oral Q0600    Assessment/ Plan:     Principal Problem:   CVA (cerebral vascular accident) ([place]) Active Problems:   Hypertension, benign   Hypothyroidism, postsurgical   CKD (chronic kidney disease), stage III (HCC)   Hyperlipidemia   Chronic hyponatremia   Anemia in chronic kidney disease   Left upper extremity numbness   Left facial numbness   Elevated serum creatinine   Accelerated hypertension  75 y.o female with  history of poorly controlled hypertension, coronary artery disease, peripheral vascular disease, chronic kidney disease stage IIIb with a GFR of about 44 cc now admitted with history of cerebrovascular accident and accelerated hypertension.  She had renal artery stenosis bilaterally and had a stent placement in the right renal artery 3 days ago.  I would like to continue the IV fluids with normal saline at 75 cc an hour for another day.   #1: Acute kidney injury: Patient now has acute kidney injury on the top of chronic kidney disease.  This is most likely due to acute tubular necrosis secondary to ischemia.  This is probably complicated by IV contrast that the patient received during the procedure.   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  Patient is followed up with Dr. Juleen China.   #3:  Renal artery stenosis: Patient has bilateral renal artery stenosis.  Patient had right renal artery stent placed.  The left renal artery was not amenable for intervention.   #4: Hypertension: Patient is currently on labetalol and clonidine.  Blood pressure is moderately controlled.  We will add a calcium channel blocker with amlodipine at 5 mg daily if needed.   #5: Anemia: Patient has chronic anemia secondary to chronic kidney disease.  Patient needs oral iron administration and subsequently needs erythropoietin supplementation.   There is no acute need for dialysis intervention.  Patient would like to go home tomorrow morning with follow-up as outpatient.   LOS: [place], MD [facility] 9/25/20222:45 PM

## 2021-06-15 DIAGNOSIS — I10 Essential (primary) hypertension: Secondary | ICD-10-CM | POA: Diagnosis not present

## 2021-06-15 DIAGNOSIS — N179 Acute kidney failure, unspecified: Secondary | ICD-10-CM | POA: Diagnosis not present

## 2021-06-15 DIAGNOSIS — I701 Atherosclerosis of renal artery: Secondary | ICD-10-CM | POA: Diagnosis not present

## 2021-06-15 LAB — BASIC METABOLIC PANEL
Anion gap: 10 (ref 5–15)
BUN: 47 mg/dL — ABNORMAL HIGH (ref 8–23)
CO2: 19 mmol/L — ABNORMAL LOW (ref 22–32)
Calcium: 8.3 mg/dL — ABNORMAL LOW (ref 8.9–10.3)
Chloride: 100 mmol/L (ref 98–111)
Creatinine, Ser: 5.12 mg/dL — ABNORMAL HIGH (ref 0.44–1.00)
GFR, Estimated: 8 mL/min — ABNORMAL LOW (ref >60)
Glucose, Bld: 93 mg/dL (ref 70–99)
Potassium: 4.7 mmol/L (ref 3.5–5.1)
Sodium: 129 mmol/L — ABNORMAL LOW (ref 135–145)

## 2021-06-15 LAB — CBC
HCT: 24.2 % — ABNORMAL LOW (ref 36.0–46.0)
Hemoglobin: 8.1 g/dL — ABNORMAL LOW (ref 12.0–15.0)
MCH: 31.3 pg (ref 26.0–34.0)
MCHC: 33.5 g/dL (ref 30.0–36.0)
MCV: 93.4 fL (ref 80.0–100.0)
Platelets: 202 10*3/uL (ref 150–400)
RBC: 2.59 MIL/uL — ABNORMAL LOW (ref 3.87–5.11)
RDW: 12.5 % (ref 11.5–15.5)
WBC: 8.4 10*3/uL (ref 4.0–10.5)
nRBC: 0 % (ref 0.0–0.2)

## 2021-06-15 LAB — CATECHOLAMINES,UR.,FREE,24 HR: Total Volume: 1450

## 2021-06-15 NOTE — Care Management Important Message (Signed)
Important Message  Patient Details  Name: Betty Nielsen MRN: [id number] Date of Birth: [birth year]   Medicare Important Message Given:  Yes     Dannette Barbara 06/15/2021, 3:42 PM

## 2021-06-15 NOTE — Progress Notes (Addendum)
PROGRESS NOTE    Betty Nielsen  [id number] DOB: Oct 31, 1945 DOA: 06/06/2021 PCP: Steele Sizer, MD   Assessment & Plan:   Principal Problem:   CVA (cerebral vascular accident) [facility]) Active Problems:   Hypertension, benign   Hypothyroidism, postsurgical   CKD (chronic kidney disease), stage III (HCC)   Hyperlipidemia   Chronic hyponatremia   Anemia in chronic kidney disease   Left upper extremity numbness   Left facial numbness   Elevated serum creatinine   Accelerated hypertension  Syncopal episode: with some LOC but no fall.  Likely due to medication    HTN: uncontrolled but improved after stent placed in renal artery. Continue on labetalol, clonidine patch. Continue to hold HCTZ, ARB   Right renal artery stenosis: as per vascular surg. 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 Will need repeat imaging in 12 months & will need to f/u outpatient w/ vascular surg    LUE/left face paresthesia: etiology unclear, possibly due to uncontrolled hypertension. CT head, MRI brain, MRA head, TTE and bilateral carotid US without significant finding. Symptoms resolved.   AKI on CKDIIIb: Cr is trending down slightly today. Continue on IVFs. Nephro following & recs apprec  Hyponatremia: labile. Will continue to monitor  Hx of right right basal ganglia CVA: continue on plavix, statin   HLD: continue on statin   Hypothyroidism: continue on home dose of levothyroxine   GERD: continue on pepcid   DVT prophylaxis: heparin  Code Status: full  Family Communication:  Disposition Plan: d/c back home   Level of care: Progressive Cardiac  Status is: Inpatient  Remains inpatient appropriate because:Ongoing diagnostic testing needed not appropriate for outpatient work up, IV treatments appropriate due to intensity of illness or inability to take PO, and Inpatient level of care appropriate due to  severity of illness, Cr continues to rise despite IVFs  Dispo: The patient is from: Home              Anticipated d/c is to: Home              Patient currently is not medically stable to d/c.   Difficult to place patient : unclear        Consultants:  Cardio  Vascular surg Nephro   Procedures:   Antimicrobials:    Subjective: Pt c/o fatigue   Objective: Vitals:   06/15/21 0315 06/15/21 0345 06/15/21 0728 06/15/21 1059  BP:  (!) 158/65 (!) 184/80 (!) 169/79  Pulse:  65 (!) 59 (!) 59  Resp:  '16 17 17  '$ Temp:  98.9 F (37.2 C) 98.7 F (37.1 C) 98.7 F (37.1 C)  TempSrc:  Oral    SpO2:  97% 99% 98%  Weight: 85.3 kg     Height:        Intake/Output Summary (Last 24 hours) at 06/15/2021 1342 Last data filed at 06/15/2021 1011 Gross per 24 hour  Intake 2556.13 ml  Output 900 ml  Net 1656.13 ml   Filed Weights   06/13/21 0500 06/14/21 0502 06/15/21 0315  Weight: 81.2 kg 86.6 kg 85.3 kg    Examination:  General exam: Appears calm but frustrated Respiratory system: clear breath sounds b/. No rales  Cardiovascular system: S1 & S2+. No rubs or gallops  Gastrointestinal system: Abd is soft, NT, obese & hypoactive bowel sounds  Central nervous system: alert and oriented. Moves all  extremities  Psychiatry: Judgement and insight appears normal. Flat mood and affect     Data Reviewed: I have personally reviewed following labs and imaging studies  CBC: Recent Labs  Lab 06/11/21 0548 06/12/21 0802 06/13/21 0642 06/14/21 0651 06/15/21 0612  WBC 7.2 9.5 12.4* 9.7 8.4  HGB 9.6* 10.3* 8.9* 8.5* 8.1*  HCT 28.6* 30.7* 27.3* 24.8* 24.2*  MCV 90.8 92.2 91.3 93.6 93.4  PLT 230 224 200 187 [id number]   Basic Metabolic Panel: Recent Labs  Lab 06/09/21 0505 06/10/21 0510 06/11/21 0548 06/12/21 0802 06/13/21 0642 06/14/21 0651 06/15/21 0612  NA 133* 132* 130* 130* 129* 129* 129*  K 4.2 4.6 4.3 4.8 4.4 4.6 4.7  CL 99 99 97* 98 96* 99 100  CO2 '24 26 24 23 '$ 20* 19*  19*  GLUCOSE 92 96 92 91 88 95 93  BUN 30* 30* 31* 41* 48* 48* 47*  CREATININE 2.55* 2.31* 2.16* 4.04* 5.05* 5.67* 5.12*  CALCIUM 9.2 8.9 9.2 9.0 8.3* 8.3* 8.3*  MG 2.1 2.1  --   --   --   --   --   PHOS 4.4 3.8  --   --   --   --   --    GFR: Estimated Creatinine Clearance: 10.2 mL/min (A) (by C-G formula based on SCr of 5.12 mg/dL (H)). Liver Function Tests: Recent Labs  Lab 06/09/21 0505 06/10/21 0510  ALBUMIN 3.4* 3.2*   No results for input(s): LIPASE, AMYLASE in the last 168 hours. No results for input(s): AMMONIA in the last 168 hours. Coagulation Profile: No results for input(s): INR, PROTIME in the last 168 hours.  Cardiac Enzymes: No results for input(s): CKTOTAL, CKMB, CKMBINDEX, TROPONINI in the last 168 hours. BNP (last 3 results) No results for input(s): PROBNP in the last 8760 hours. HbA1C: No results for input(s): HGBA1C in the last 72 hours.  CBG: Recent Labs  Lab 06/09/21 1015  GLUCAP 133*   Lipid Profile: No results for input(s): CHOL, HDL, LDLCALC, TRIG, CHOLHDL, LDLDIRECT in the last 72 hours.  Thyroid Function Tests: No results for input(s): TSH, T4TOTAL, FREET4, T3FREE, THYROIDAB in the last 72 hours. Anemia Panel: No results for input(s): VITAMINB12, FOLATE, FERRITIN, TIBC, IRON, RETICCTPCT in the last 72 hours. Sepsis Labs: No results for input(s): PROCALCITON, LATICACIDVEN in the last 168 hours.  Recent Results (from the past 240 hour(s))  Resp Panel by RT-PCR (Flu A&B, Covid) Nasopharyngeal Swab     Status: None   Collection Time: 06/06/21  9:57 PM   Specimen: Nasopharyngeal Swab; Nasopharyngeal(NP) swabs in vial transport medium  Result Value Ref Range Status   SARS Coronavirus 2 by RT PCR NEGATIVE NEGATIVE Final    Comment: (NOTE) SARS-CoV-2 target nucleic acids are NOT DETECTED.  The SARS-CoV-2 RNA is generally detectable in upper respiratory specimens during the acute phase of infection. The lowest concentration of SARS-CoV-2  viral copies this assay can detect is 138 copies/mL. A negative result does not preclude SARS-Cov-2 infection and should not be used as the sole basis for treatment or other patient management decisions. A negative result may occur with  improper specimen collection/handling, submission of specimen other than nasopharyngeal swab, presence of viral mutation(s) within the areas targeted by this assay, and inadequate number of viral copies(<138 copies/mL). A negative result must be combined with clinical observations, patient history, and epidemiological information. The expected result is Negative.  Fact Sheet for Patients:  EntrepreneurPulse.com.au  Fact Sheet for Healthcare Providers:  IncredibleEmployment.be  This  test is no t yet approved or cleared by the [place] and  has been authorized for detection and/or diagnosis of SARS-CoV-2 by FDA under an Emergency Use Authorization (EUA). This EUA will remain  in effect (meaning this test can be used) for the duration of the COVID-19 declaration under Section 564(b)(1) of the Act, 21 U.S.C.section 360bbb-3(b)(1), unless the authorization is terminated  or revoked sooner.       Influenza A by PCR NEGATIVE NEGATIVE Final   Influenza B by PCR NEGATIVE NEGATIVE Final    Comment: (NOTE) The Xpert Xpress SARS-CoV-2/FLU/RSV plus assay is intended as an aid in the diagnosis of influenza from Nasopharyngeal swab specimens and should not be used as a sole basis for treatment. Nasal washings and aspirates are unacceptable for Xpert Xpress SARS-CoV-2/FLU/RSV testing.  Fact Sheet for Patients: EntrepreneurPulse.com.au  Fact Sheet for Healthcare Providers: IncredibleEmployment.be  This test is not yet approved or cleared by the [place] FDA and has been authorized for detection and/or diagnosis of SARS-CoV-2 by FDA under an Emergency Use Authorization  (EUA). This EUA will remain in effect (meaning this test can be used) for the duration of the COVID-19 declaration under Section 564(b)(1) of the Act, 21 U.S.C. section 360bbb-3(b)(1), unless the authorization is terminated or revoked.  Performed at [facility], [address] [place], [place] [address]          Radiology Studies: No results found.      Scheduled Meds:  aspirin EC  81 mg Oral Daily   atorvastatin  40 mg Oral q1800   cloNIDine  0.2 mg Transdermal Weekly   clopidogrel  75 mg Oral Daily   famotidine  10 mg Oral Daily   heparin  5,000 Units Subcutaneous Q8H   labetalol  200 mg Oral BID   levothyroxine  75 mcg Oral Q0600   Continuous Infusions:  sodium chloride 75 mL/hr at 06/15/21 0914      LOS: 6 days    Time spent: 20 mins     Wyvonnia Dusky, MD [facility] Pager [phone]  If 7PM-7AM, please contact night-coverage 06/15/2021, 1:42 PM

## 2021-06-15 NOTE — Progress Notes (Addendum)
[facility]  PROGRESS NOTE   Subjective:   Patient seen laying in bed Alert and oriented, stating she if ready to go home Poor appetite, but states she wants home cooking, not hospital food Denies shortness of breath  Objective:  Vital signs in last 24 hours:  Temp:  [98.1 F (36.7 C)-99.8 F (37.7 C)] 98.7 F (37.1 C) (09/26 0728) Pulse Rate:  [59-67] 59 (09/26 0728) Resp:  [15-17] 17 (09/26 0728) BP: (145-184)/(65-80) 184/80 (09/26 0728) SpO2:  [96 %-100 %] 99 % (09/26 0728) Weight:  [85.3 kg] 85.3 kg (09/26 0315)  Weight change: -1.324 kg Filed Weights   06/13/21 0500 06/14/21 0502 06/15/21 0315  Weight: 81.2 kg 86.6 kg 85.3 kg    Intake/Output: I/O last 3 completed shifts: In: 3642.4 [P.O.:1320; I.V.:2322.4] Out: 1700 [Urine:1700]   Intake/Output this shift:  Total I/O In: 793.8 [P.O.:120; I.V.:673.8] Out: -   Physical Exam: General:  No acute distress  Head:  Normocephalic, atraumatic. Moist oral mucosal membranes  Eyes:  Anicteric  Lungs:   Clear to auscultation, normal effort  Heart:  S1S2 no rubs  Abdomen:   Soft, nontender, bowel sounds present  Extremities:  peripheral edema.  Neurologic:  Awake, alert, following commands  Skin:  No lesions       Basic Metabolic Panel: Recent Labs  Lab 06/09/21 0505 06/10/21 0510 06/11/21 0548 06/12/21 0802 06/13/21 0642 06/14/21 0651 06/15/21 0612  NA 133* 132* 130* 130* 129* 129* 129*  K 4.2 4.6 4.3 4.8 4.4 4.6 4.7  CL 99 99 97* 98 96* 99 100  CO2 $Re'24 26 24 23 'HYQ$ 20* 19* 19*  GLUCOSE 92 96 92 91 88 95 93  BUN 30* 30* 31* 41* 48* 48* 47*  CREATININE 2.55* 2.31* 2.16* 4.04* 5.05* 5.67* 5.12*  CALCIUM 9.2 8.9 9.2 9.0 8.3* 8.3* 8.3*  MG 2.1 2.1  --   --   --   --   --   PHOS 4.4 3.8  --   --   --   --   --      CBC: Recent Labs  Lab 06/11/21 0548 06/12/21 0802 06/13/21 0642 06/14/21 0651 06/15/21 0612  WBC 7.2 9.5 12.4* 9.7 8.4  HGB 9.6* 10.3* 8.9* 8.5* 8.1*  HCT 28.6* 30.7* 27.3*  24.8* 24.2*  MCV 90.8 92.2 91.3 93.6 93.4  PLT 230 224 200 187 202      Urinalysis: No results for input(s): COLORURINE, LABSPEC, PHURINE, GLUCOSEU, HGBUR, BILIRUBINUR, KETONESUR, PROTEINUR, UROBILINOGEN, NITRITE, LEUKOCYTESUR in the last 72 hours.  Invalid input(s): APPERANCEUR    Imaging: No results found.   Medications:    sodium chloride 75 mL/hr at 06/15/21 8546    aspirin EC  81 mg Oral Daily   atorvastatin  40 mg Oral q1800   cloNIDine  0.2 mg Transdermal Weekly   clopidogrel  75 mg Oral Daily   famotidine  10 mg Oral Daily   heparin  5,000 Units Subcutaneous Q8H   labetalol  200 mg Oral BID   levothyroxine  75 mcg Oral Q0600    Assessment/ Plan:     Principal Problem:   CVA (cerebral vascular accident) ([place]) Active Problems:   Hypertension, benign   Hypothyroidism, postsurgical   CKD (chronic kidney disease), stage III (HCC)   Hyperlipidemia   Chronic hyponatremia   Anemia in chronic kidney disease   Left upper extremity numbness   Left facial numbness   Elevated serum creatinine   Accelerated hypertension  75 y.o female  with history of poorly controlled hypertension, coronary artery disease, peripheral vascular disease, chronic kidney disease stage IIIb with a GFR of about 44 cc now admitted with history of cerebrovascular accident and accelerated hypertension.  She had renal artery stenosis bilaterally and had a stent placement in the right renal artery 3 days ago.  I would like to continue the IV fluids with normal saline at 75 cc an hour for another day.   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 Patient now has acute kidney injury on the top of chronic kidney disease.  This is most likely due to acute tubular necrosis secondary to ischemia.  This is probably complicated by IV contrast that the patient received during the procedure. Baseline creatinine 1.98  with eGFR 26. Discussed with patient that due to her elevated creatinine, it would not be safe to discharge today.Patient may require temporary dialysis if renal recovery is not gained. Patient agreeable to remain in hospital for close monitoring  Creatinine improved to 5.12 today  Continue IVF at NS @ 75 ml/hr  Will need follow up with nephrology at discharge   #2: Renal artery stenosis: Bilateral renal artery stenosis.  Right renal artery stent placed on Thursday.  The left renal artery was not amenable for intervention.    #4: Hypertension: Patient is currently on labetalol and clonidine.  Blood pressure is moderately controlled.  Currently prescribed Clonidine and labetalol.    #5: Anemia:secondary to chronic kidney disease.  Hgb 8.1. Would benefit from iron supplements and possibly EPO.      LOS: [facility] 9/26/202210:54 AM

## 2021-06-16 DIAGNOSIS — N179 Acute kidney failure, unspecified: Secondary | ICD-10-CM | POA: Diagnosis not present

## 2021-06-16 DIAGNOSIS — I10 Essential (primary) hypertension: Secondary | ICD-10-CM | POA: Diagnosis not present

## 2021-06-16 DIAGNOSIS — I701 Atherosclerosis of renal artery: Secondary | ICD-10-CM | POA: Diagnosis not present

## 2021-06-16 LAB — CBC
HCT: 22.8 % — ABNORMAL LOW (ref 36.0–46.0)
Hemoglobin: 7.6 g/dL — ABNORMAL LOW (ref 12.0–15.0)
MCH: 31.1 pg (ref 26.0–34.0)
MCHC: 33.3 g/dL (ref 30.0–36.0)
MCV: 93.4 fL (ref 80.0–100.0)
Platelets: 222 10*3/uL (ref 150–400)
RBC: 2.44 MIL/uL — ABNORMAL LOW (ref 3.87–5.11)
RDW: 12.4 % (ref 11.5–15.5)
WBC: 8.1 10*3/uL (ref 4.0–10.5)
nRBC: 0 % (ref 0.0–0.2)

## 2021-06-16 LAB — BASIC METABOLIC PANEL
Anion gap: 9 (ref 5–15)
BUN: 44 mg/dL — ABNORMAL HIGH (ref 8–23)
CO2: 18 mmol/L — ABNORMAL LOW (ref 22–32)
Calcium: 8.3 mg/dL — ABNORMAL LOW (ref 8.9–10.3)
Chloride: 103 mmol/L (ref 98–111)
Creatinine, Ser: 4.95 mg/dL — ABNORMAL HIGH (ref 0.44–1.00)
GFR, Estimated: 9 mL/min — ABNORMAL LOW (ref >60)
Glucose, Bld: 85 mg/dL (ref 70–99)
Potassium: 4.8 mmol/L (ref 3.5–5.1)
Sodium: 130 mmol/L — ABNORMAL LOW (ref 135–145)

## 2021-06-16 MED ORDER — POLYETHYLENE GLYCOL 3350 17 G PO PACK
17.0000 g | PACK | Freq: Every day | ORAL | Status: DC
  Administered 2021-06-16 – 2021-06-17 (×2): 17 g via ORAL
  Filled 2021-06-16 (×2): qty 1

## 2021-06-16 NOTE — Progress Notes (Signed)
PROGRESS NOTE   HPI was taken from Dr. Velia Meyer: Betty Nielsen is a 75 y.o. 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    The patient reports sudden onset of numbness involving the left upper extremity as well as the left side of her face starting at approximately 2100 on 06/05/2021 while at rest.  She went to bed last evening while experiencing these symptoms, in the setting of her perpetuation on awaking this morning, elected to present to [facility] ED for further evaluation thereof.  She reports that the numbness involving the left upper extremity starts at approximately the level of the left shoulder distally down both the anterior and posterior aspects of her left arm and terminating in the distal aspects of all 5 digits on the left hand.  While she has not yet regained her baseline sensation involving the left upper extremity, she reports this some improvement relative to her initial left upper extremity numbness, now noting the sensation of paresthesias in the above distribution.  Regarding her left-sided facial numbness she reports that, initially, she was experiencing numbness to the V2/V3distribution left portion of her face, without the sensory deficit crossing midline of the face.  She denies involvement of the left forehead.  Similar to the left upper extremity, she reports that the numbness associated with her left face has gradually improved since onset as well, now noting the sensation  paresthesias in the above distribution.   Denies any associated acute focal weakness,dysphagia, dizziness, vertigo, nausea, vomiting, change in vision, blurry vision, diplopia, word finding difficulties, slurring of speech, facial droop, or headache. Denies any associated chest pain, shortness of breath, palpitations, diaphoresis, presyncope, or syncope.   She confirms a history of acute ischemic stroke in February 2021, at which time MRI brain revealed small acute lacunar infarct in the right caudate nucleus.  Subsequently, she reports good compliance on Plavix 75 mg p.o. daily as her sole outpatient blood thinner, noting no formal anticoagulation or use of aspirin at home.  She notes that she has remained on daily Plavix since the time of her aforementioned acute ischemic stroke, as she experienced a stroke while already on a daily aspirin.   In terms of modifiable ischemic CVA risk factors, the patient confirms a history of hypertension for which she reports good compliance with her home irbesartan, labetalol, and oral clonidine.  She also confirms a history of hyperlipidemia for which she reports compliance on atorvastatin 40 mg p.o. daily.  Confirms that she is a former smoker, having completely quit smoking in 2000 after previously smoking half pack per day over the preceding 20 years.  Denies any known history of underlying diabetes, atrial fibrillation, or obstructive sleep apnea.   Hospital course from Dr. Jimmye Norman 9/21-9/27/22: Pt was found to have uncontrolled HTN and on many anti-HTN meds without much improvement. [place] of renal arteries was done which should severe left renal artery stenosis. Vascular surg was consulted and pt is s/p right renal artery stent placement but left renal artery was severely occluded & no intervention could be done. Since that  time, pt's Cr has been trending up. Cr today is starting to trend down but not back at baseline. Continue on IVFs and d/c home when cleared by  nephro. Pt ambulates independently and does not require rehab. For information, please see previous progress/consult notes.    Betty Nielsen  [id number] DOB: [birth year] DOA: 06/06/2021 PCP: Steele Sizer, MD   Assessment & Plan:   Principal Problem:   CVA (cerebral vascular accident) [facility]) Active Problems:   Hypertension, benign   Hypothyroidism, postsurgical   CKD (chronic kidney disease), stage III (HCC)   Hyperlipidemia   Chronic hyponatremia   Anemia in chronic kidney disease   Left upper extremity numbness   Left facial numbness   Elevated serum creatinine   Accelerated hypertension  Syncopal episode: with some LOC but no fall.  Likely due to medication    HTN: uncontrolled but improved after stent placed in renal artery.  Continue on labetalol, clonidine patch. Continue to hold HCTZ, ARB  Right renal artery stenosis: as per vascular surg. 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 Will need repeat imaging in 12 months & will need to f/u outpatient w/ vascular surg    LUE/left face paresthesia: etiology unclear, possibly due to uncontrolled hypertension. CT head, MRI brain, MRA head, TTE and bilateral carotid US without significant finding. Symptoms resolved.   AKI on CKDIIIb: Cr is trending down again today but not at baseline. Continue on IVFs. Nephro following and recs apprec   Hyponatremia: labile. Will continue to monitor   Hx of right right basal ganglia CVA: continue on plavix, statin   HLD: continue on statin   Hypothyroidism:continue on home dose of levothyroxine   GERD: continue on pepcid    DVT prophylaxis: heparin  Code Status: full  Family Communication:  Disposition Plan: d/c back home   Level of care: Progressive Cardiac  Status is: Inpatient  Remains inpatient appropriate because:Ongoing diagnostic testing needed not appropriate for outpatient work  up, IV treatments appropriate due to intensity of illness or inability to take PO, and Inpatient level of care appropriate due to severity of illness, Cr is trending down today but not at baseline. Will d/c when cleared by nephro   Dispo: The patient is from: Home              Anticipated d/c is to: Home              Patient currently is not medically stable to d/c.   Difficult to place patient : unclear        Consultants:  Cardio  Vascular surg Nephro   Procedures:   Antimicrobials:    Subjective: Pt c/o malaise   Objective: Vitals:   06/15/21 1949 06/15/21 2358 06/16/21 0408 06/16/21 0726  BP: (!) 180/89 (!) 162/59 (!) 180/65 (!) 187/69  Pulse: 65 64 60 62  Resp: '18 18 20 17  '$ Temp: 98.8 F (37.1 C) 99.3 F (37.4 C) 98.8 F (37.1 C) 98.5 F (36.9 C)  TempSrc: Oral Oral Oral   SpO2: 99% 98% 99% 100%  Weight:      Height:        Intake/Output Summary (Last 24 hours) at 06/16/2021 0743 Last data filed at 06/16/2021 0606 Gross per 24 hour  Intake 2628.31 ml  Output 850 ml  Net 1778.31 ml   Filed Weights   06/13/21 0500 06/14/21 0502  06/15/21 0315  Weight: 81.2 kg 86.6 kg 85.3 kg    Examination:  General exam: Appears calm but frustrated  Respiratory system: clear breath sounds b/l. No rales  Cardiovascular system: S1/S2+. No rubs or clicks  Gastrointestinal system: Abd is soft, NT, obese & hypoactive bowel sounds  Central nervous system: alert and oriented. Moves all extremities  Psychiatry: Judgement and insight appear normal. Frustrated mood      Data Reviewed: I have personally reviewed following labs and imaging studies  CBC: Recent Labs  Lab 06/12/21 0802 06/13/21 0642 06/14/21 0651 06/15/21 0612 06/16/21 0442  WBC 9.5 12.4* 9.7 8.4 8.1  HGB 10.3* 8.9* 8.5* 8.1* 7.6*  HCT 30.7* 27.3* 24.8* 24.2* 22.8*  MCV 92.2 91.3 93.6 93.4 93.4  PLT 224 200 187 202 [id number]   Basic Metabolic Panel: Recent Labs  Lab 06/10/21 0510 06/11/21 0548  06/12/21 0802 06/13/21 0642 06/14/21 0651 06/15/21 0612 06/16/21 0442  NA 132*   < > 130* 129* 129* 129* 130*  K 4.6   < > 4.8 4.4 4.6 4.7 4.8  CL 99   < > 98 96* 99 100 103  CO2 26   < > 23 20* 19* 19* 18*  GLUCOSE 96   < > 91 88 95 93 85  BUN 30*   < > 41* 48* 48* 47* 44*  CREATININE 2.31*   < > 4.04* 5.05* 5.67* 5.12* 4.95*  CALCIUM 8.9   < > 9.0 8.3* 8.3* 8.3* 8.3*  MG 2.1  --   --   --   --   --   --   PHOS 3.8  --   --   --   --   --   --    < > = values in this interval not displayed.   GFR: Estimated Creatinine Clearance: 10.6 mL/min (A) (by C-G formula based on SCr of 4.95 mg/dL (H)). Liver Function Tests: Recent Labs  Lab 06/10/21 0510  ALBUMIN 3.2*   No results for input(s): LIPASE, AMYLASE in the last 168 hours. No results for input(s): AMMONIA in the last 168 hours. Coagulation Profile: No results for input(s): INR, PROTIME in the last 168 hours.  Cardiac Enzymes: No results for input(s): CKTOTAL, CKMB, CKMBINDEX, TROPONINI in the last 168 hours. BNP (last 3 results) No results for input(s): PROBNP in the last 8760 hours. HbA1C: No results for input(s): HGBA1C in the last 72 hours.  CBG: Recent Labs  Lab 06/09/21 1015  GLUCAP 133*   Lipid Profile: No results for input(s): CHOL, HDL, LDLCALC, TRIG, CHOLHDL, LDLDIRECT in the last 72 hours.  Thyroid Function Tests: No results for input(s): TSH, T4TOTAL, FREET4, T3FREE, THYROIDAB in the last 72 hours. Anemia Panel: No results for input(s): VITAMINB12, FOLATE, FERRITIN, TIBC, IRON, RETICCTPCT in the last 72 hours. Sepsis Labs: No results for input(s): PROCALCITON, LATICACIDVEN in the last 168 hours.  Recent Results (from the past 240 hour(s))  Resp Panel by RT-PCR (Flu A&B, Covid) Nasopharyngeal Swab     Status: None   Collection Time: 06/06/21  9:57 PM   Specimen: Nasopharyngeal Swab; Nasopharyngeal(NP) swabs in vial transport medium  Result Value Ref Range Status   SARS Coronavirus 2 by RT PCR  NEGATIVE NEGATIVE Final    Comment: (NOTE) SARS-CoV-2 target nucleic acids are NOT DETECTED.  The SARS-CoV-2 RNA is generally detectable in upper respiratory specimens during the acute phase of infection. The lowest concentration of SARS-CoV-2 viral copies this assay can detect is 138 copies/mL. A  negative result does not preclude SARS-Cov-2 infection and should not be used as the sole basis for treatment or other patient management decisions. A negative result may occur with  improper specimen collection/handling, submission of specimen other than nasopharyngeal swab, presence of viral mutation(s) within the areas targeted by this assay, and inadequate number of viral copies(<138 copies/mL). A negative result must be combined with clinical observations, patient history, and epidemiological information. The expected result is Negative.  Fact Sheet for Patients:  EntrepreneurPulse.com.au  Fact Sheet for Healthcare Providers:  IncredibleEmployment.be  This test is no t yet approved or cleared by the [place] FDA and  has been authorized for detection and/or diagnosis of SARS-CoV-2 by FDA under an Emergency Use Authorization (EUA). This EUA will remain  in effect (meaning this test can be used) for the duration of the COVID-19 declaration under Section 564(b)(1) of the Act, 21 U.S.C.section 360bbb-3(b)(1), unless the authorization is terminated  or revoked sooner.       Influenza A by PCR NEGATIVE NEGATIVE Final   Influenza B by PCR NEGATIVE NEGATIVE Final    Comment: (NOTE) The Xpert Xpress SARS-CoV-2/FLU/RSV plus assay is intended as an aid in the diagnosis of influenza from Nasopharyngeal swab specimens and should not be used as a sole basis for treatment. Nasal washings and aspirates are unacceptable for Xpert Xpress SARS-CoV-2/FLU/RSV testing.  Fact Sheet for Patients: EntrepreneurPulse.com.au  Fact Sheet for  Healthcare Providers: IncredibleEmployment.be  This test is not yet approved or cleared by the [place] FDA and has been authorized for detection and/or diagnosis of SARS-CoV-2 by FDA under an Emergency Use Authorization (EUA). This EUA will remain in effect (meaning this test can be used) for the duration of the COVID-19 declaration under Section 564(b)(1) of the Act, 21 U.S.C. section 360bbb-3(b)(1), unless the authorization is terminated or revoked.  Performed at [facility], [address], [place], [place] [address]          Radiology Studies: No results found.      Scheduled Meds:  aspirin EC  81 mg Oral Daily   atorvastatin  40 mg Oral q1800   cloNIDine  0.2 mg Transdermal Weekly   clopidogrel  75 mg Oral Daily   famotidine  10 mg Oral Daily   heparin  5,000 Units Subcutaneous Q8H   labetalol  200 mg Oral BID   levothyroxine  75 mcg Oral Q0600   Continuous Infusions:  sodium chloride 75 mL/hr at 06/16/21 0606      LOS: 7 days    Time spent: 25 mins     Wyvonnia Dusky, MD [facility] Pager [phone]  If 7PM-7AM, please contact night-coverage 06/16/2021, 7:43 AM

## 2021-06-16 NOTE — Progress Notes (Signed)
[facility]  PROGRESS NOTE   Subjective:   Patient seen laying in bed Alert and oriented Tolerating meals Denies shortness of breath  Objective:  Vital signs in last 24 hours:  Temp:  [98.5 F (36.9 C)-99.3 F (37.4 C)] 98.5 F (36.9 C) (09/27 0726) Pulse Rate:  [60-65] 62 (09/27 0726) Resp:  [17-20] 17 (09/27 0726) BP: (162-187)/(59-89) 187/69 (09/27 0726) SpO2:  [98 %-100 %] 100 % (09/27 0726)  Weight change:  Filed Weights   06/13/21 0500 06/14/21 0502 06/15/21 0315  Weight: 81.2 kg 86.6 kg 85.3 kg    Intake/Output: I/O last 3 completed shifts: In: 3552.1 [P.O.:720; I.V.:2832.1] Out: 1750 [Urine:1750]   Intake/Output this shift:  No intake/output data recorded.  Physical Exam: General:  No acute distress  Head:  Normocephalic, atraumatic. Moist oral mucosal membranes  Eyes:  Anicteric  Lungs:   Clear to auscultation, normal effort  Heart:  S1S2 no rubs  Abdomen:   Soft, nontender, bowel sounds present  Extremities:  No peripheral edema.  Neurologic:  Awake, alert, following commands  Skin:  No lesions       Basic Metabolic Panel: Recent Labs  Lab 06/10/21 0510 06/11/21 0548 06/12/21 0802 06/13/21 6767 06/14/21 0651 06/15/21 0612 06/16/21 0442  NA 132*   < > 130* 129* 129* 129* 130*  K 4.6   < > 4.8 4.4 4.6 4.7 4.8  CL 99   < > 98 96* 99 100 103  CO2 26   < > 23 20* 19* 19* 18*  GLUCOSE 96   < > 91 88 95 93 85  BUN 30*   < > 41* 48* 48* 47* 44*  CREATININE 2.31*   < > 4.04* 5.05* 5.67* 5.12* 4.95*  CALCIUM 8.9   < > 9.0 8.3* 8.3* 8.3* 8.3*  MG 2.1  --   --   --   --   --   --   PHOS 3.8  --   --   --   --   --   --    < > = values in this interval not displayed.     CBC: Recent Labs  Lab 06/12/21 0802 06/13/21 0642 06/14/21 0651 06/15/21 0612 06/16/21 0442  WBC 9.5 12.4* 9.7 8.4 8.1  HGB 10.3* 8.9* 8.5* 8.1* 7.6*  HCT 30.7* 27.3* 24.8* 24.2* 22.8*  MCV 92.2 91.3 93.6 93.4 93.4  PLT 224 200 187 202 222       Urinalysis: No results for input(s): COLORURINE, LABSPEC, PHURINE, GLUCOSEU, HGBUR, BILIRUBINUR, KETONESUR, PROTEINUR, UROBILINOGEN, NITRITE, LEUKOCYTESUR in the last 72 hours.  Invalid input(s): APPERANCEUR    Imaging: No results found.   Medications:    sodium chloride 75 mL/hr at 06/16/21 2094    aspirin EC  81 mg Oral Daily   atorvastatin  40 mg Oral q1800   cloNIDine  0.2 mg Transdermal Weekly   clopidogrel  75 mg Oral Daily   famotidine  10 mg Oral Daily   heparin  5,000 Units Subcutaneous Q8H   labetalol  200 mg Oral BID   levothyroxine  75 mcg Oral Q0600   polyethylene glycol  17 g Oral Daily    Assessment/ Plan:     Principal Problem:   CVA (cerebral vascular accident) ([place]) Active Problems:   Hypertension, benign   Hypothyroidism, postsurgical   CKD (chronic kidney disease), stage III (HCC)   Hyperlipidemia   Chronic hyponatremia   Anemia in chronic kidney disease   Left upper extremity  numbness   Left facial numbness   Elevated serum creatinine   Accelerated hypertension  75 y.o female with history of poorly controlled hypertension, coronary artery disease, peripheral vascular disease, chronic kidney disease stage IIIb with a GFR of about 44 cc now admitted with history of cerebrovascular accident and accelerated hypertension.  She had renal artery stenosis bilaterally and had a stent placement in the right renal artery 3 days ago.  I would like to continue the IV fluids with normal saline at 75 cc an hour for another day.   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 Patient now has acute kidney injury on the top of chronic kidney disease.  This is most likely due to acute tubular necrosis secondary to ischemia.  This is probably complicated by IV contrast that the patient received during the procedure. Baseline creatinine 1.98 with eGFR 26. Discussed with  patient that due to her elevated creatinine, it would not be safe to discharge.Patient may require temporary dialysis if renal recovery is not gained.   Creatinine continues to slowly improve   IVF at 0.9% NS @ 75 ml/hr  Nephrology follow up at discharge   #2: Renal artery stenosis: Bilateral renal artery stenosis.  Right renal artery stent placed on Thursday.  The left renal artery was not amenable for intervention.    #4: Hypertension: Patient is currently on labetalol and clonidine.  Blood pressure is elevated.  Currently prescribed Clonidine and labetalol.    #5: Anemia:secondary to chronic kidney disease.  Hgb 7.6. Would benefit from iron supplements and possibly EPO. Would consider blood transfusion when levels <7, but will defer this decision to primary team     LOS: [facility] 9/27/202211:58 AM

## 2021-06-17 DIAGNOSIS — N179 Acute kidney failure, unspecified: Secondary | ICD-10-CM | POA: Diagnosis not present

## 2021-06-17 DIAGNOSIS — D638 Anemia in other chronic diseases classified elsewhere: Secondary | ICD-10-CM

## 2021-06-17 DIAGNOSIS — E875 Hyperkalemia: Secondary | ICD-10-CM | POA: Diagnosis not present

## 2021-06-17 DIAGNOSIS — E871 Hypo-osmolality and hyponatremia: Secondary | ICD-10-CM | POA: Diagnosis not present

## 2021-06-17 DIAGNOSIS — E039 Hypothyroidism, unspecified: Secondary | ICD-10-CM

## 2021-06-17 DIAGNOSIS — E785 Hyperlipidemia, unspecified: Secondary | ICD-10-CM

## 2021-06-17 DIAGNOSIS — N189 Chronic kidney disease, unspecified: Secondary | ICD-10-CM

## 2021-06-17 DIAGNOSIS — I150 Renovascular hypertension: Principal | ICD-10-CM

## 2021-06-17 LAB — BASIC METABOLIC PANEL
Anion gap: 4 — ABNORMAL LOW (ref 5–15)
BUN: 37 mg/dL — ABNORMAL HIGH (ref 8–23)
CO2: 19 mmol/L — ABNORMAL LOW (ref 22–32)
Calcium: 8.4 mg/dL — ABNORMAL LOW (ref 8.9–10.3)
Chloride: 106 mmol/L (ref 98–111)
Creatinine, Ser: 4.14 mg/dL — ABNORMAL HIGH (ref 0.44–1.00)
GFR, Estimated: 11 mL/min — ABNORMAL LOW (ref >60)
Glucose, Bld: 87 mg/dL (ref 70–99)
Potassium: 5.1 mmol/L (ref 3.5–5.1)
Sodium: 129 mmol/L — ABNORMAL LOW (ref 135–145)

## 2021-06-17 LAB — CBC
HCT: 23.6 % — ABNORMAL LOW (ref 36.0–46.0)
Hemoglobin: 7.6 g/dL — ABNORMAL LOW (ref 12.0–15.0)
MCH: 30.2 pg (ref 26.0–34.0)
MCHC: 32.2 g/dL (ref 30.0–36.0)
MCV: 93.7 fL (ref 80.0–100.0)
Platelets: 219 10*3/uL (ref 150–400)
RBC: 2.52 MIL/uL — ABNORMAL LOW (ref 3.87–5.11)
RDW: 12.5 % (ref 11.5–15.5)
WBC: 6.8 10*3/uL (ref 4.0–10.5)
nRBC: 0 % (ref 0.0–0.2)

## 2021-06-17 LAB — FERRITIN: Ferritin: 169 ng/mL (ref 11–307)

## 2021-06-17 MED ORDER — PATIROMER SORBITEX CALCIUM 8.4 G PO PACK
16.8000 g | PACK | Freq: Once | ORAL | Status: AC
  Administered 2021-06-17: 16.8 g via ORAL
  Filled 2021-06-17: qty 2

## 2021-06-17 MED ORDER — PATIROMER SORBITEX CALCIUM 8.4 G PO PACK: 8.4000 g | PACK | Freq: Every day | ORAL | Status: DC

## 2021-06-17 MED ORDER — CLONIDINE HCL 0.2 MG/24HR TD PTWK: 0.2000 mg | MEDICATED_PATCH | TRANSDERMAL | 0 refills | Status: DC

## 2021-06-17 MED ORDER — PATIROMER SORBITEX CALCIUM 8.4 G PO PACK: 8.4000 g | PACK | Freq: Every day | ORAL | 0 refills | Status: DC

## 2021-06-17 MED ORDER — LABETALOL HCL 200 MG PO TABS: 200.0000 mg | ORAL_TABLET | Freq: Two times a day (BID) | ORAL | 0 refills | Status: DC

## 2021-06-17 NOTE — Plan of Care (Signed)
  Problem: Coping: Goal: Level of anxiety will decrease 06/17/2021 1206 by Alen Blew, RN Outcome: Adequate for Discharge 06/17/2021 1205 by Alen Blew, RN Outcome: Adequate for Discharge   Problem: Elimination: Goal: Will not experience complications related to bowel motility 06/17/2021 1206 by Alen Blew, RN Outcome: Adequate for Discharge 06/17/2021 1205 by Alen Blew, RN Outcome: Adequate for Discharge Goal: Will not experience complications related to urinary retention 06/17/2021 1206 by Alen Blew, RN Outcome: Adequate for Discharge 06/17/2021 1205 by Alen Blew, RN Outcome: Adequate for Discharge   Problem: Pain Managment: Goal: General experience of comfort will improve 06/17/2021 1206 by Alen Blew, RN Outcome: Adequate for Discharge 06/17/2021 1205 by Alen Blew, RN Outcome: Adequate for Discharge   Problem: Safety: Goal: Ability to remain free from injury will improve 06/17/2021 1206 by Alen Blew, RN Outcome: Adequate for Discharge 06/17/2021 1205 by Alen Blew, RN Outcome: Adequate for Discharge

## 2021-06-17 NOTE — Progress Notes (Signed)
[facility]  PROGRESS NOTE   Subjective:   Patient seen laying in bed Alert and oriented Tolerating small meals Denies shortness of breath  Objective:  Vital signs in last 24 hours:  Temp:  [98 F (36.7 C)-98.9 F (37.2 C)] 98 F (36.7 C) (09/28 0743) Pulse Rate:  [54-64] 58 (09/28 0743) Resp:  [17-20] 18 (09/28 0743) BP: (103-195)/(62-83) 195/83 (09/28 0743) SpO2:  [99 %-100 %] 99 % (09/28 0743) Weight:  [87.8 kg] 87.8 kg (09/28 0353)  Weight change:  Filed Weights   06/14/21 0502 06/15/21 0315 06/17/21 0353  Weight: 86.6 kg 85.3 kg 87.8 kg    Intake/Output: I/O last 3 completed shifts: In: 2126.7 [P.O.:360; I.V.:1766.7] Out: 250 [Urine:250]   Intake/Output this shift:  Total I/O In: 240 [P.O.:240] Out: -   Physical Exam: General:  No acute distress  Head:  Normocephalic, atraumatic. Moist oral mucosal membranes  Eyes:  Anicteric  Lungs:   Clear to auscultation, normal effort  Heart:  S1S2 no rubs  Abdomen:   Soft, nontender, bowel sounds present  Extremities:  No peripheral edema.  Neurologic:  Awake, alert, following commands  Skin:  No lesions       Basic Metabolic Panel: Recent Labs  Lab 06/13/21 0642 06/14/21 0651 06/15/21 0612 06/16/21 0442 06/17/21 0514  NA 129* 129* 129* 130* 129*  K 4.4 4.6 4.7 4.8 5.1  CL 96* 99 100 103 106  CO2 20* 19* 19* 18* 19*  GLUCOSE 88 95 93 85 87  BUN 48* 48* 47* 44* 37*  CREATININE 5.05* 5.67* 5.12* 4.95* 4.14*  CALCIUM 8.3* 8.3* 8.3* 8.3* 8.4*     CBC: Recent Labs  Lab 06/13/21 0642 06/14/21 0651 06/15/21 0612 06/16/21 0442 06/17/21 0514  WBC 12.4* 9.7 8.4 8.1 6.8  HGB 8.9* 8.5* 8.1* 7.6* 7.6*  HCT 27.3* 24.8* 24.2* 22.8* 23.6*  MCV 91.3 93.6 93.4 93.4 93.7  PLT 200 187 202 222 219      Urinalysis: No results for input(s): COLORURINE, LABSPEC, PHURINE, GLUCOSEU, HGBUR, BILIRUBINUR, KETONESUR, PROTEINUR, UROBILINOGEN, NITRITE, LEUKOCYTESUR in the last 72 hours.  Invalid  input(s): APPERANCEUR    Imaging: No results found.   Medications:    sodium chloride 75 mL/hr at 06/17/21 9702    aspirin EC  81 mg Oral Daily   atorvastatin  40 mg Oral q1800   cloNIDine  0.2 mg Transdermal Weekly   clopidogrel  75 mg Oral Daily   famotidine  10 mg Oral Daily   heparin  5,000 Units Subcutaneous Q8H   labetalol  200 mg Oral BID   levothyroxine  75 mcg Oral Q0600   polyethylene glycol  17 g Oral Daily    Assessment/ Plan:     Principal Problem:   CVA (cerebral vascular accident) ([place]) Active Problems:   Hypertension, benign   Hypothyroidism, postsurgical   CKD (chronic kidney disease), stage III (HCC)   Hyperlipidemia   Chronic hyponatremia   Anemia in chronic kidney disease   Left upper extremity numbness   Left facial numbness   Elevated serum creatinine   Accelerated hypertension  75 y.o female with history of poorly controlled hypertension, coronary artery disease, peripheral vascular disease, chronic kidney disease stage IIIb with a GFR of about 44 cc now admitted with history of cerebrovascular accident and accelerated hypertension.  She had renal artery stenosis bilaterally and had a stent placement in the right renal artery 3 days ago.  I would like to continue the IV fluids with normal  saline at 75 cc an hour for another day.   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 Patient now has acute kidney injury on the top of chronic kidney disease.  This is most likely due to acute tubular necrosis secondary to ischemia.  This is probably complicated by IV contrast that the patient received during the procedure. Baseline creatinine 1.98 with eGFR 26.    Creatinine has improved to 11% with adequate urine output. Patient cleared to discharge with very close follow up with our office on Monday.     #2: Renal artery stenosis: Bilateral renal artery stenosis.  Right  renal artery stent placed on Thursday.  The left renal artery was not amenable for intervention. Monitoring   #4: Hypertension: Patient is currently on labetalol and clonidine.  Blood pressure remains elevated.  Clonidine and labetalol ordered.    #5: Anemia:secondary to chronic kidney disease.  Hgb remains 7.6. Would benefit from iron supplements and possibly EPO. Would consider blood transfusion when levels <7, but will defer this decision to primary team     LOS: [facility] 9/28/20229:17 AM

## 2021-06-17 NOTE — Plan of Care (Signed)

## 2021-06-17 NOTE — Discharge Summary (Signed)
[facility] [place] NAME: Betty Nielsen    MR#:  [id number]  DATE OF BIRTH:  1945-11-29  DATE OF ADMISSION:  06/06/2021 ADMITTING PHYSICIAN: Mercy Riding, MD  DATE OF DISCHARGE: 06/17/2021 12:51 PM  PRIMARY CARE PHYSICIAN: Steele Sizer, MD    ADMISSION DIAGNOSIS:  Numbness [R20.0] Hypertension, benign [I10] CVA (cerebral vascular accident) ([place]) [I63.9] Hypertension, unspecified type [I10] Accelerated hypertension [I10]  DISCHARGE DIAGNOSIS:  Principal Problem:   CVA (cerebral vascular accident) ([place]) Active Problems:   Hypertension, benign   Hypothyroidism, postsurgical   CKD (chronic kidney disease), stage III (HCC)   Hyperlipidemia   Chronic hyponatremia   Anemia in chronic kidney disease   Left upper extremity numbness   Left facial numbness   Elevated serum creatinine   Accelerated hypertension   SECONDARY DIAGNOSIS:   Past Medical History:  Diagnosis Date   Arthrosis of knee    Chronic kidney disease    Hyperlipidemia    Hypertension    Loss of hearing    Metabolic syndrome    Plantar fasciitis    Renal insufficiency     HOSPITAL COURSE:   1.  Secondary hypertension secondary to renal artery stenosis.  Patient had a stent placement on the right renal artery by vascular surgery on 06/11/2021. 2.  Acute kidney injury on chronic kidney disease stage IIIb.  After renal artery stent creatinine peaked at 5.67 on 06/14/2021.  Creatinine 4.14 upon discharge.  Nephrology thinks this could be acute tubular necrosis.  Case discussed with nephrology and they were okay with close follow-up on Monday to recheck creatinine. 3.  Hyperkalemia.  Potassium 5.1.  Will give Veltassa today prior to disposition and Veltassa on a daily basis until seen by nephrology. 4.  Hyponatremia.  Sodium 129 upon discharge.  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  Follow-up as outpatient. 5.  Anemia of chronic disease.  Hemoglobin low at  7.6. 6.  History of prior right basal ganglia CVA on Plavix and statin 7.  Hyperlipidemia unspecified on statin 8.  Hypothyroidism unspecified on levothyroxine 9.  GERD on Pepcid. 10.  Syncopal episode DISCHARGE CONDITIONS:   Satisfactory  CONSULTS OBTAINED:  Treatment Team:  Lyla Son, MD  DRUG ALLERGIES:   Allergies  Allergen Reactions   Ace Inhibitors Cough   Ciprofloxacin Other (See Comments)    unknown   Citalopram     hyponatremia   Hydrocodone-Acetaminophen Other (See Comments)    unknown   Norvasc [Amlodipine Besylate]     Pruritus    Pantoprazole Sodium Other (See Comments)    Cramps   Pravastatin Other (See Comments)    Stomach cramps   Sulfa Antibiotics     unknown   Clonidine Other (See Comments)    Per patient told by allergist that would be best to stop but nephrologist recommended her to stay on medication     DISCHARGE MEDICATIONS:   Allergies as of 06/17/2021       Reactions   Ace Inhibitors Cough   Ciprofloxacin Other (See Comments)   unknown   Citalopram    hyponatremia   Hydrocodone-acetaminophen Other (See Comments)   unknown   Norvasc [amlodipine Besylate]    Pruritus   Pantoprazole Sodium Other (See Comments)   Cramps   Pravastatin Other (See Comments)   Stomach cramps   Sulfa Antibiotics    unknown   Clonidine Other (See Comments)   Per patient told by allergist that would  be best to stop but nephrologist recommended her to stay on medication         Medication List     STOP taking these medications    cloNIDine 0.2 MG tablet Commonly known as: CATAPRES Replaced by: cloNIDine 0.2 mg/24hr patch   irbesartan 300 MG tablet Commonly known as: AVAPRO   montelukast 10 MG tablet Commonly known as: SINGULAIR       TAKE these medications    aspirin 81 MG EC tablet Take 1 tablet (81 mg total) by mouth daily. Swallow whole.   atorvastatin 40 MG tablet Commonly known as: LIPITOR Take 1 tablet (40 mg total) by  mouth daily at 6 PM. Cholesterol medication.   cetirizine 10 MG tablet Commonly known as: ZYRTEC Take 1 tablet (10 mg total) by mouth daily as needed. PRN only   Cholecalciferol 25 MCG (1000 UT) tablet Take 1,000 Units by mouth daily.   cloNIDine 0.2 mg/24hr patch Commonly known as: CATAPRES - Dosed in mg/24 hr Place 1 patch (0.2 mg total) onto the skin every 7 (seven) days. Replaces: cloNIDine 0.2 MG tablet   clopidogrel 75 MG tablet Commonly known as: PLAVIX TAKE 1 TABLET BY MOUTH ONCE DAILY. BLOOD THINNER TO HELP REDUCE STROKE RISK   Fish Oil 1000 MG Cpdr Take 2 capsules by mouth daily.   hydrochlorothiazide 12.5 MG capsule Commonly known as: MICROZIDE Take 1 capsule (12.5 mg total) by mouth daily.   labetalol 200 MG tablet Commonly known as: NORMODYNE Take 1 tablet (200 mg total) by mouth 2 (two) times daily. What changed:  medication strength how much to take   levothyroxine 75 MCG tablet Commonly known as: SYNTHROID Take 1 tablet (75 mcg total) by mouth daily before breakfast.   nitroGLYCERIN 0.4 MG SL tablet Commonly known as: NITROSTAT Place under the tongue.   patiromer 8.4 g packet Commonly known as: VELTASSA Take 1 packet (8.4 g total) by mouth daily. Start taking on: June 18, 2021         DISCHARGE INSTRUCTIONS:   Follow-up nephrology on Monday Follow-up PMD 1 week Follow-up vascular surgery  If you experience worsening of your admission symptoms, develop shortness of breath, life threatening emergency, suicidal or homicidal thoughts you must seek medical attention immediately by calling 911 or calling your MD immediately  if symptoms less severe.  You Must read complete instructions/literature along with all the possible adverse reactions/side effects for all the Medicines you take and that have been prescribed to you. Take any new Medicines after you have completely understood and accept all the possible adverse reactions/side effects.    Please note  You were cared for by a hospitalist during your hospital stay. If you have any questions about your discharge medications or the care you received while you were in the hospital after you are discharged, you can call the unit and asked to speak with the hospitalist on call if the hospitalist that took care of you is not available. Once you are discharged, your primary care physician will handle any further medical issues. Please note that NO REFILLS for any discharge medications will be authorized once you are discharged, as it is imperative that you return to your primary care physician (or establish a relationship with a primary care physician if you do not have one) for your aftercare needs so that they can reassess your need for medications and monitor your lab values.    Today   CHIEF COMPLAINT:   Chief Complaint  Patient presents  with   Dizziness   Numbness    HISTORY OF PRESENT ILLNESS:  Alannie Portela  is a 75 y.o. female admitted with dizziness and numbness   VITAL SIGNS:  Blood pressure (!) 178/83, pulse (!) 59, temperature 98.5 F (36.9 C), temperature source Oral, resp. rate 18, height '5\' 5"'$  (1.651 m), weight 87.8 kg, SpO2 97 %.  I/O:   Intake/Output Summary (Last 24 hours) at 06/17/2021 1744 Last data filed at 06/17/2021 1021 Gross per 24 hour  Intake 613.88 ml  Output --  Net 613.88 ml    PHYSICAL EXAMINATION:  GENERAL:  75 y.o.-year-old patient lying in the bed with no acute distress.  EYES: Pupils equal, round, reactive to light and accommodation. No scleral icterus.  HEENT: Head atraumatic, normocephalic. Oropharynx and nasopharynx clear.  LUNGS: Normal breath sounds bilaterally, no wheezing, rales,rhonchi or crepitation.  CARDIOVASCULAR: S1, S2 normal. No murmurs, rubs, or gallops.  ABDOMEN: Soft, non-tender, non-distended.  EXTREMITIES: No pedal edema.  NEUROLOGIC: Cranial nerves II through XII are intact. Muscle strength 5/5 in all  extremities. Sensation intact. Gait not checked.  PSYCHIATRIC: The patient is alert and oriented x 3.  SKIN: No obvious rash, lesion, or ulcer.   DATA REVIEW:   CBC Recent Labs  Lab 06/17/21 0514  WBC 6.8  HGB 7.6*  HCT 23.6*  PLT 219    Chemistries  Recent Labs  Lab 06/17/21 0514  NA 129*  K 5.1  CL 106  CO2 19*  GLUCOSE 87  BUN 37*  CREATININE 4.14*  CALCIUM 8.4*      Microbiology Results  Results for orders placed or performed during the hospital encounter of 06/06/21  Resp Panel by RT-PCR (Flu A&B, Covid) Nasopharyngeal Swab     Status: None   Collection Time: 06/06/21  9:57 PM   Specimen: Nasopharyngeal Swab; Nasopharyngeal(NP) swabs in vial transport medium  Result Value Ref Range Status   SARS Coronavirus 2 by RT PCR NEGATIVE NEGATIVE Final    Comment: (NOTE) SARS-CoV-2 target nucleic acids are NOT DETECTED.  The SARS-CoV-2 RNA is generally detectable in upper respiratory specimens during the acute phase of infection. The lowest concentration of SARS-CoV-2 viral copies this assay can detect is 138 copies/mL. A negative result does not preclude SARS-Cov-2 infection and should not be used as the sole basis for treatment or other patient management decisions. A negative result may occur with  improper specimen collection/handling, submission of specimen other than nasopharyngeal swab, presence of viral mutation(s) within the areas targeted by this assay, and inadequate number of viral copies(<138 copies/mL). A negative result must be combined with clinical observations, patient history, and epidemiological information. The expected result is Negative.  Fact Sheet for Patients:  EntrepreneurPulse.com.au  Fact Sheet for Healthcare Providers:  IncredibleEmployment.be  This test is no t yet approved or cleared by the [place] FDA and  has been authorized for detection and/or diagnosis of SARS-CoV-2 by FDA under an  Emergency Use Authorization (EUA). This EUA will remain  in effect (meaning this test can be used) for the duration of the COVID-19 declaration under Section 564(b)(1) of the Act, 21 U.S.C.section 360bbb-3(b)(1), unless the authorization is terminated  or revoked sooner.       Influenza A by PCR NEGATIVE NEGATIVE Final   Influenza B by PCR NEGATIVE NEGATIVE Final    Comment: (NOTE) The Xpert Xpress SARS-CoV-2/FLU/RSV plus assay is intended as an aid in the diagnosis of influenza from Nasopharyngeal swab specimens and should not be used as  a sole basis for treatment. Nasal washings and aspirates are unacceptable for Xpert Xpress SARS-CoV-2/FLU/RSV testing.  Fact Sheet for Patients: EntrepreneurPulse.com.au  Fact Sheet for Healthcare Providers: IncredibleEmployment.be  This test is not yet approved or cleared by the [place] FDA and has been authorized for detection and/or diagnosis of SARS-CoV-2 by FDA under an Emergency Use Authorization (EUA). This EUA will remain in effect (meaning this test can be used) for the duration of the COVID-19 declaration under Section 564(b)(1) of the Act, 21 U.S.C. section 360bbb-3(b)(1), unless the authorization is terminated or revoked.  Performed at [facility], [address] [place], [place] [address]      Management plans discussed with the patient, and she is in agreement.  Patient declined me calling family.  CODE STATUS:     Code Status Orders  (From admission, onward)           Start     Ordered   06/06/21 2340  Full code  Continuous        06/06/21 2340           Code Status History     Date Active Date Inactive Code Status Order ID Comments User Context   10/26/2019 1620 10/28/2019 2134 Full Code [id number]  Lenna Sciara, MD Inpatient   08/29/2018 2102 09/01/2018 1858 Full Code [id number]  Demetrios Loll, MD Inpatient   10/25/2016 1816 10/27/2016 1726 Full Code [id number]   Loletha Grayer, MD ED       TOTAL TIME TAKING CARE OF THIS PATIENT: 35 minutes.    Loletha Grayer M.D on 06/17/2021 at 5:44 PM   [facility]  CC: Primary care physician; Steele Sizer, MD

## 2021-06-18 ENCOUNTER — Telehealth: Payer: Self-pay

## 2021-06-18 LAB — METANEPHRINES, URINE, 24 HOUR
Metaneph Total, Ur: 72 ug/L
Metanephrines, 24H Ur: 104 ug/24 hr (ref 36–209)
Normetanephrine, 24H Ur: 529 ug/24 hr (ref 131–612)
Normetanephrine, Ur: 365 ug/L
Total Volume: 1450

## 2021-06-18 NOTE — Telephone Encounter (Signed)
Transition Care Management Follow-up Telephone Call Date of discharge and from where: 06/17/21 [facility] How have you been since you were released from the hospital? Pt states she is doing okay Any questions or concerns? Yes - pt states she was switched from clonidine pills to patches but the patches are $100 and she cannot afford those. The veltessa she was also prescribed is $300. Pt to discuss at follow up app tomorrow.  Items Reviewed: Did the pt receive and understand the discharge instructions provided? Yes  Medications obtained and verified? Yes  Other? No  Any new allergies since your discharge? No  Dietary orders reviewed? Yes Do you have support at home? Yes   Home Care and Equipment/Supplies: Were home health services ordered? no Were any new equipment or medical supplies ordered?  No  Functional Questionnaire: (I = Independent and D = Dependent) ADLs: I  Bathing/Dressing- I   Meal Prep- I with assistance  Eating- I  Maintaining continence- I  Transferring/Ambulation- I  Managing Meds- I  Follow up appointments reviewed:  PCP Hospital f/u appt confirmed? Yes  Scheduled to see Dr. Ancil Boozer on 06/19/21. [facility] f/u appt confirmed? Yes  Scheduled to see Dr. Clayborn Bigness on 06/29/21. Pt also following up with Dr. Holley Raring on 06/30/21 and Dr. Lucky Cowboy 07/14/21 Are transportation arrangements needed? No  If their condition worsens, is the pt aware to call PCP or go to the Emergency Dept.? Yes Was the patient provided with contact information for the PCP's office or ED? Yes Was to pt encouraged to call back with questions or concerns? Yes

## 2021-06-18 NOTE — Progress Notes (Signed)
Name: Betty Nielsen   MRN: [id number]    DOB: January 12, 1946   Date:06/19/2021       Progress Note  Subjective  Chief Complaint  Follow Up  HPI  Hospital discharged follow up:   Admission date: 06/06/2021 Discharge date: 06/17/2021   Discharge diagnosis:  CVA Accelerated Hypertension  Renal artery stenosis - s/p stent placement Chronic hyponatremia CKI    She developed left facial and arm tingling on 06/06/2021, she states symptoms were similar to her previous CAD event and decided to be evaluated. She had multiple CT's and MRI brain that did not show stroke, but must have been diagnosed based on symptoms. She was found to have renal artery stenosis and stent was placed on 06/14/2021 , during her stay her kidney function dropped and potassium went up. She was given Lazarus Salines but was unable to afford it. She has been using clonidine patch given during her stay ( but states will not be able to afford it after this month), she was also given HCTZ by cardiologist but nephrologist had stopped it in the past due to her chronic hyponatremia. We did medication reconciliation. Discussed getting labs today but she states she has follow up in less than one week with nephrologist and would like to hold off to have labs done. She also does not want to change medications. She is feeling tired since she went home and asked for home health, explained since she is able to drive Medicare will not cover home health ( must be home bound)  She states her appetite is not back to normal. She denies any bowel movements ( states baseline is 2-3 times a week) abdominal pain just on the surgical site, but passing flatus without problems. No nausea or vomiting, no pruritis, tingling and numbness has resolved.   She states she will have labs done by Dr. Holley Raring on Monday Seeing Dr. Clayborn Bigness on Oct 10 th    Patient Active Problem List   Diagnosis Date Noted   Acute kidney injury superimposed on CKD ([place])    Hyperkalemia     Hypothyroidism    Accelerated hypertension 06/09/2021   Left upper extremity numbness 06/07/2021   Left facial numbness 06/07/2021   Elevated serum creatinine 06/07/2021   CVA (cerebral vascular accident) ([place]) 06/06/2021   Benign hypertension with chronic kidney disease, stage III ([place]) 06/18/2020   Anemia of chronic disease 11/21/2019   Stage 3b chronic kidney disease ([place]) 11/21/2019   Hypo-osmolality and hyponatremia 11/21/2019   Secondary hyperparathyroidism of renal origin ([place]) 11/21/2019   History of stroke 10/27/2019   PMR (polymyalgia rheumatica) ([place]) 06/15/2017   Headache 10/27/2016   CKD (chronic kidney disease), stage III ([place]) 10/27/2016   Hyperlipidemia 10/27/2016   Hyponatremia 10/27/2016   Angina pectoris ([place]) 10/25/2016   History of non-ST elevation myocardial infarction (NSTEMI) 10/25/2016   History of coronary angioplasty 10/25/2016   BPV (benign positional vertigo) 06/15/2016   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[place]) 11/21/2015   Acid reflux 06/05/2015   Benign secondary hypertension due to renal artery stenosis ([place]) 06/05/2015   Hypothyroidism, 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   Major depression in remission ([place]) 06/05/2015   Obesity (BMI 30.0-34.9) 06/05/2015   Neuropathy 06/05/2015   Perennial allergic rhinitis with seasonal variation 06/05/2015   Arthritis, degenerative 07/03/2014   AAA (abdominal aortic aneurysm) without rupture ([place]) 06/26/2014    Past Surgical History:  Procedure Laterality Date   ABDOMINAL AORTIC ANEURYSM REPAIR     ABDOMINAL HYSTERECTOMY     APPENDECTOMY  CORONARY STENT INTERVENTION N/A 10/26/2016   Procedure: Coronary Stent Intervention;  Surgeon: Yolonda Kida, MD;  Location: [facility];  Service: Cardiovascular;  Laterality: N/A;   HERNIA REPAIR     INNER EAR SURGERY     pt not sure of type   LEFT HEART CATH AND CORONARY ANGIOGRAPHY N/A 10/26/2016   Procedure: Left Heart Cath and Coronary Angiography;  Surgeon:  Teodoro Spray, MD;  Location: [facility];  Service: Cardiovascular;  Laterality: N/A;   RENAL ANGIOGRAPHY N/A 06/11/2021   Procedure: RENAL ANGIOGRAPHY;  Surgeon: Algernon Huxley, MD;  Location: [facility];  Service: Cardiovascular;  Laterality: N/A;   THYROIDECTOMY      Family History  Problem Relation Age of Onset   Healthy Mother    Cerebral aneurysm Father     Social History   Tobacco Use   Smoking status: Former    Packs/day: 0.50    Years: 20.00    Pack years: 10.00    Types: Cigarettes    Quit date: 2000    Years since quitting: 22.7   Smokeless tobacco: Never   Tobacco comments:    smoking cessation materials not required  Substance Use Topics   Alcohol use: No    Alcohol/week: 0.0 standard drinks     Current Outpatient Medications:    aspirin EC 81 MG EC tablet, Take 1 tablet (81 mg total) by mouth daily. Swallow whole., Disp: 30 tablet, Rfl: 0   cetirizine (ZYRTEC) 10 MG tablet, Take 1 tablet (10 mg total) by mouth daily as needed. PRN only, Disp: , Rfl:    Cholecalciferol 25 MCG (1000 UT) tablet, Take 1,000 Units by mouth daily. , Disp: , Rfl:    cloNIDine (CATAPRES - DOSED IN MG/24 HR) 0.2 mg/24hr patch, Place 1 patch (0.2 mg total) onto the skin every 7 (seven) days., Disp: 4 patch, Rfl: 0   clopidogrel (PLAVIX) 75 MG tablet, TAKE 1 TABLET BY MOUTH ONCE DAILY. BLOOD THINNER TO HELP REDUCE STROKE RISK, Disp: 90 tablet, Rfl: 1   hydrochlorothiazide (MICROZIDE) 12.5 MG capsule, Take 1 capsule (12.5 mg total) by mouth daily., Disp: 30 capsule, Rfl: 0   labetalol (NORMODYNE) 200 MG tablet, Take 1 tablet (200 mg total) by mouth 2 (two) times daily., Disp: 60 tablet, Rfl: 0   levothyroxine (SYNTHROID) 75 MCG tablet, Take 1 tablet (75 mcg total) by mouth daily before breakfast., Disp: 90 tablet, Rfl: 1   nitroGLYCERIN (NITROSTAT) 0.4 MG SL tablet, Place under the tongue., Disp: , Rfl:    Omega-3 Fatty Acids (FISH OIL) 1000 MG CPDR, Take 2 capsules by  mouth daily., Disp: , Rfl:    patiromer (VELTASSA) 8.4 g packet, Take 1 packet (8.4 g total) by mouth daily., Disp: 30 each, Rfl: 0   atorvastatin (LIPITOR) 40 MG tablet, Take 1 tablet (40 mg total) by mouth daily at 6 PM. Cholesterol medication., Disp: 90 tablet, Rfl: 1  Allergies  Allergen Reactions   Ace Inhibitors Cough   Ciprofloxacin Other (See Comments)    unknown   Citalopram     hyponatremia   Hydrocodone-Acetaminophen Other (See Comments)    unknown   Norvasc [Amlodipine Besylate]     Pruritus    Pantoprazole Sodium Other (See Comments)    Cramps   Pravastatin Other (See Comments)    Stomach cramps   Sulfa Antibiotics     unknown   Clonidine Other (See Comments)    Per patient told by  allergist that would be best to stop but nephrologist recommended her to stay on medication     I personally reviewed active problem list, medication list, allergies, family history, social history, health maintenance with the patient/caregiver today.   ROS  Ten systems reviewed and is negative except as mentioned in HPI   Objective  Vitals:   06/19/21 0857  BP: 132/84  Pulse: 73  Resp: 16  Temp: 98.2 F (36.8 C)  SpO2: 99%  Weight: 196 lb (88.9 kg)  Height: '5\' 5"'$  (1.651 m)    Body mass index is 32.62 kg/m.  Physical Exam  Constitutional: Patient appears well-developed and well-nourished. Obese  No distress.  HEENT: head atraumatic, normocephalic, pupils equal and reactive to ligh, neck supple Cardiovascular: Normal rate, regular rhythm and normal heart sounds.  No murmur heard. No BLE edema. Pulmonary/Chest: Effort normal and breath sounds normal. No respiratory distress. Abdominal: Soft.  There is mild tenderness on incision site, normal bowel sounds  Psychiatric: Patient has a normal mood and affect. behavior is normal. Judgment and thought content normal.   Recent Results (from the past 2160 hour(s))  Protime-INR     Status: None   Collection Time: 06/06/21   9:10 PM  Result Value Ref Range   Prothrombin Time 13.2 11.4 - 15.2 seconds   INR 1.0 0.8 - 1.2    Comment: (NOTE) INR goal varies based on device and disease states. Performed at [facility], [address] [place], [place] [address]   APTT     Status: None   Collection Time: 06/06/21  9:10 PM  Result Value Ref Range   aPTT 29 24 - 36 seconds    Comment: Performed at [facility], [address] [place], [place] [address]  CBC     Status: Abnormal   Collection Time: 06/06/21  9:10 PM  Result Value Ref Range   WBC 5.8 4.0 - 10.5 K/uL   RBC 3.18 (L) 3.87 - 5.11 MIL/uL   Hemoglobin 10.0 (L) 12.0 - 15.0 g/dL   HCT 29.9 (L) 36.0 - 46.0 %   MCV 94.0 80.0 - 100.0 fL   MCH 31.4 26.0 - 34.0 pg   MCHC 33.4 30.0 - 36.0 g/dL   RDW 12.4 11.5 - 15.5 %   Platelets 213 150 - 400 K/uL   nRBC 0.0 0.0 - 0.2 %    Comment: Performed at [facility], [address] [place], [place] [address]  Differential     Status: None   Collection Time: 06/06/21  9:10 PM  Result Value Ref Range   Neutrophils Relative % 43 %   Neutro Abs 2.5 1.7 - 7.7 K/uL   Lymphocytes Relative 42 %   Lymphs Abs 2.4 0.7 - 4.0 K/uL   Monocytes Relative 9 %   Monocytes Absolute 0.5 0.1 - 1.0 K/uL   Eosinophils Relative 5 %   Eosinophils Absolute 0.3 0.0 - 0.5 K/uL   Basophils Relative 1 %   Basophils Absolute 0.0 0.0 - 0.1 K/uL   Immature Granulocytes 0 %   Abs Immature Granulocytes 0.01 0.00 - 0.07 K/uL    Comment: Performed at [facility], [address] [place], [place] [address]  Comprehensive metabolic panel     Status: Abnormal   Collection Time: 06/06/21  9:10 PM  Result Value Ref Range   Sodium 131 (L) 135 - 145 mmol/L   Potassium 4.4 3.5 - 5.1 mmol/L   Chloride 98 98 - 111 mmol/L  CO2 25 22 - 32 mmol/L   Glucose, Bld 119 (H) 70 - 99 mg/dL    Comment: Glucose reference range applies only to samples taken after fasting for at least 8 hours.   BUN 26 (H) 8 - 23  mg/dL   Creatinine, Ser 1.98 (H) 0.44 - 1.00 mg/dL   Calcium 9.0 8.9 - 10.3 mg/dL   Total Protein 6.6 6.5 - 8.1 g/dL   Albumin 3.7 3.5 - 5.0 g/dL   AST 20 15 - 41 U/L   ALT 12 0 - 44 U/L   Alkaline Phosphatase 89 38 - 126 U/L   Total Bilirubin 0.6 0.3 - 1.2 mg/dL   GFR, Estimated 26 (L) >60 mL/min    Comment: (NOTE) Calculated using the CKD-EPI Creatinine Equation (2021)    Anion gap 8 5 - 15    Comment: Performed at [facility], [address] [place], [place] [address]  CBG monitoring, ED     Status: Abnormal   Collection Time: 06/06/21  9:10 PM  Result Value Ref Range   Glucose-Capillary 114 (H) 70 - 99 mg/dL    Comment: Glucose reference range applies only to samples taken after fasting for at least 8 hours.  Brain natriuretic peptide     Status: Abnormal   Collection Time: 06/06/21  9:10 PM  Result Value Ref Range   B Natriuretic Peptide 156.9 (H) 0.0 - 100.0 pg/mL    Comment: Performed at [facility], [place], [place] [address]  Troponin I (High Sensitivity)     Status: None   Collection Time: 06/06/21  9:57 PM  Result Value Ref Range   Troponin I (High Sensitivity) 9 <18 ng/L    Comment: (NOTE) Elevated high sensitivity troponin I (hsTnI) values and significant  changes across serial measurements may suggest ACS but many other  chronic and acute conditions are known to elevate hsTnI results.  Refer to the "Links" section for chest pain algorithms and additional  guidance. Performed at [facility], [address] [place], [place] [address]   Resp Panel by RT-PCR (Flu A&B, Covid) Nasopharyngeal Swab     Status: None   Collection Time: 06/06/21  9:57 PM   Specimen: Nasopharyngeal Swab; Nasopharyngeal(NP) swabs in vial transport medium  Result Value Ref Range   SARS Coronavirus 2 by RT PCR NEGATIVE NEGATIVE    Comment: (NOTE) SARS-CoV-2 target nucleic acids are NOT DETECTED.  The SARS-CoV-2 RNA is generally detectable  in upper respiratory specimens during the acute phase of infection. The lowest concentration of SARS-CoV-2 viral copies this assay can detect is 138 copies/mL. A negative result does not preclude SARS-Cov-2 infection and should not be used as the sole basis for treatment or other patient management decisions. A negative result may occur with  improper specimen collection/handling, submission of specimen other than nasopharyngeal swab, presence of viral mutation(s) within the areas targeted by this assay, and inadequate number of viral copies(<138 copies/mL). A negative result must be combined with clinical observations, patient history, and epidemiological information. The expected result is Negative.  Fact Sheet for Patients:  EntrepreneurPulse.com.au  Fact Sheet for Healthcare Providers:  IncredibleEmployment.be  This test is no t yet approved or cleared by the [place] FDA and  has been authorized for detection and/or diagnosis of SARS-CoV-2 by FDA under an Emergency Use Authorization (EUA). This EUA will remain  in effect (meaning this test can be used) for the duration of the COVID-19 declaration under Section 564(b)(1)  of the Act, 21 U.S.C.section 360bbb-3(b)(1), unless the authorization is terminated  or revoked sooner.       Influenza A by PCR NEGATIVE NEGATIVE   Influenza B by PCR NEGATIVE NEGATIVE    Comment: (NOTE) The Xpert Xpress SARS-CoV-2/FLU/RSV plus assay is intended as an aid in the diagnosis of influenza from Nasopharyngeal swab specimens and should not be used as a sole basis for treatment. Nasal washings and aspirates are unacceptable for Xpert Xpress SARS-CoV-2/FLU/RSV testing.  Fact Sheet for Patients: EntrepreneurPulse.com.au  Fact Sheet for Healthcare Providers: IncredibleEmployment.be  This test is not yet approved or cleared by the [place] FDA and has been  authorized for detection and/or diagnosis of SARS-CoV-2 by FDA under an Emergency Use Authorization (EUA). This EUA will remain in effect (meaning this test can be used) for the duration of the COVID-19 declaration under Section 564(b)(1) of the Act, 21 U.S.C. section 360bbb-3(b)(1), unless the authorization is terminated or revoked.  Performed at [facility], [address], [place] [address]   Troponin I (High Sensitivity)     Status: None   Collection Time: 06/06/21 11:45 PM  Result Value Ref Range   Troponin I (High Sensitivity) 9 <18 ng/L    Comment: (NOTE) Elevated high sensitivity troponin I (hsTnI) values and significant  changes across serial measurements may suggest ACS but many other  chronic and acute conditions are known to elevate hsTnI results.  Refer to the "Links" section for chest pain algorithms and additional  guidance. Performed at [facility], [address] [place], [place] [address]   Magnesium     Status: None   Collection Time: 06/06/21 11:45 PM  Result Value Ref Range   Magnesium 2.0 1.7 - 2.4 mg/dL    Comment: Performed at [facility], [address] [place], [place] [address]  Magnesium     Status: None   Collection Time: 06/07/21  7:40 AM  Result Value Ref Range   Magnesium 2.0 1.7 - 2.4 mg/dL    Comment: Performed at [facility], [address] [place], [place] [address]  Comprehensive metabolic panel     Status: Abnormal   Collection Time: 06/07/21  7:40 AM  Result Value Ref Range   Sodium 132 (L) 135 - 145 mmol/L   Potassium 4.7 3.5 - 5.1 mmol/L   Chloride 99 98 - 111 mmol/L   CO2 23 22 - 32 mmol/L   Glucose, Bld 109 (H) 70 - 99 mg/dL    Comment: Glucose reference range applies only to samples taken after fasting for at least 8 hours.   BUN 24 (H) 8 - 23 mg/dL   Creatinine, Ser 1.87 (H) 0.44 - 1.00 mg/dL   Calcium 9.6 8.9 - 10.3 mg/dL   Total Protein 6.9 6.5 - 8.1 g/dL   Albumin 3.8  3.5 - 5.0 g/dL   AST 23 15 - 41 U/L   ALT 12 0 - 44 U/L   Alkaline Phosphatase 84 38 - 126 U/L   Total Bilirubin 0.7 0.3 - 1.2 mg/dL   GFR, Estimated 28 (L) >60 mL/min    Comment: (NOTE) Calculated using the CKD-EPI Creatinine Equation (2021)    Anion gap 10 5 - 15    Comment: Performed at [facility], [address] [place], [place] [address]  CBC     Status: Abnormal   Collection Time: 06/07/21  7:40 AM  Result Value Ref Range   WBC 7.8 4.0 - 10.5 K/uL  RBC 3.50 (L) 3.87 - 5.11 MIL/uL   Hemoglobin 10.9 (L) 12.0 - 15.0 g/dL   HCT 32.1 (L) 36.0 - 46.0 %   MCV 91.7 80.0 - 100.0 fL   MCH 31.1 26.0 - 34.0 pg   MCHC 34.0 30.0 - 36.0 g/dL   RDW 12.4 11.5 - 15.5 %   Platelets 233 150 - 400 K/uL   nRBC 0.0 0.0 - 0.2 %    Comment: Performed at [facility], [address] [place], [place] [address]  Hemoglobin A1c     Status: Abnormal   Collection Time: 06/07/21  7:40 AM  Result Value Ref Range   Hgb A1c MFr Bld 5.7 (H) 4.8 - 5.6 %    Comment: (NOTE) Pre diabetes:          5.7%-6.4%  Diabetes:              >6.4%  Glycemic control for   <7.0% adults with diabetes    Mean Plasma Glucose 116.89 mg/dL    Comment: Performed at [facility] [address], [place], [place] [address]  Lipid panel     Status: Abnormal   Collection Time: 06/07/21  7:40 AM  Result Value Ref Range   Cholesterol 214 (H) 0 - 200 mg/dL   Triglycerides 82 <150 mg/dL   HDL 70 >40 mg/dL   Total CHOL/HDL Ratio 3.1 RATIO   VLDL 16 0 - 40 mg/dL   LDL Cholesterol 128 (H) 0 - 99 mg/dL    Comment:        Total Cholesterol/HDL:CHD Risk Coronary Heart Disease Risk Table                     Men   Women  1/2 Average Risk   3.4   3.3  Average Risk       5.0   4.4  2 X Average Risk   9.6   7.1  3 X Average Risk  23.4   11.0        Use the calculated Patient Ratio above and the CHD Risk Table to determine the patient's CHD Risk.        ATP III CLASSIFICATION (LDL):  <100      mg/dL   Optimal  100-129  mg/dL   Near or Above                    Optimal  130-159  mg/dL   Borderline  160-189  mg/dL   High  >190     mg/dL   Very High Performed at [address] [place],  [address]   Urinalysis, Complete w Microscopic     Status: Abnormal   Collection Time: 06/07/21  9:12 AM  Result Value Ref Range   Color, Urine YELLOW YELLOW   APPearance CLEAR CLEAR   Specific Gravity, Urine 1.020 1.005 - 1.030   pH 7.0 5.0 - 8.0   Glucose, UA NEGATIVE NEGATIVE mg/dL   Hgb urine dipstick MODERATE (A) NEGATIVE   Bilirubin Urine NEGATIVE NEGATIVE   Ketones, ur NEGATIVE NEGATIVE mg/dL   Protein, ur 100 (A) NEGATIVE mg/dL   Nitrite NEGATIVE NEGATIVE   Leukocytes,Ua NEGATIVE NEGATIVE   RBC / HPF 21-50 0 - 5 RBC/hpf   WBC, UA 0-5 0 - 5 WBC/hpf   Bacteria, UA NONE SEEN NONE SEEN   Squamous Epithelial / LPF 0-5 0 - 5   Mucus PRESENT  Comment: Performed at [facility], [address] [place], [place] [address]  Sodium, urine, random     Status: None   Collection Time: 06/07/21  9:12 AM  Result Value Ref Range   Sodium, Ur 103 mmol/L    Comment: Performed at [facility], [address] [place], [place] [address]  Creatinine, urine, random     Status: None   Collection Time: 06/07/21  9:12 AM  Result Value Ref Range   Creatinine, Urine 29 mg/dL    Comment: Performed at [facility], [address] [place], [place] [address]  ECHOCARDIOGRAM COMPLETE BUBBLE STUDY     Status: None   Collection Time: 06/07/21 12:11 PM  Result Value Ref Range   Ao pk vel 1.67 m/s   AR max vel 2.57 cm2   AV Peak grad 11.1 mmHg   S' Lateral 3.20 cm   Area-P 1/2 3.39 cm2  Renal function panel     Status: Abnormal   Collection Time: 06/08/21  7:07 AM  Result Value Ref Range   Sodium 131 (L) 135 - 145 mmol/L   Potassium 4.5 3.5 - 5.1 mmol/L   Chloride 98 98 - 111 mmol/L   CO2 23 22 - 32 mmol/L   Glucose, Bld 102 (H) 70 - 99  mg/dL    Comment: Glucose reference range applies only to samples taken after fasting for at least 8 hours.   BUN 26 (H) 8 - 23 mg/dL   Creatinine, Ser 2.51 (H) 0.44 - 1.00 mg/dL   Calcium 9.6 8.9 - 10.3 mg/dL   Phosphorus 4.1 2.5 - 4.6 mg/dL   Albumin 3.9 3.5 - 5.0 g/dL   GFR, Estimated 19 (L) >60 mL/min    Comment: (NOTE) Calculated using the CKD-EPI Creatinine Equation (2021)    Anion gap 10 5 - 15    Comment: Performed at [facility], [address] [place], [place] [address]  Magnesium     Status: None   Collection Time: 06/08/21  7:07 AM  Result Value Ref Range   Magnesium 1.9 1.7 - 2.4 mg/dL    Comment: Performed at [facility], [address] [place], [place] [address]  CBC     Status: Abnormal   Collection Time: 06/08/21  7:07 AM  Result Value Ref Range   WBC 7.0 4.0 - 10.5 K/uL   RBC 3.53 (L) 3.87 - 5.11 MIL/uL   Hemoglobin 10.8 (L) 12.0 - 15.0 g/dL   HCT 32.1 (L) 36.0 - 46.0 %   MCV 90.9 80.0 - 100.0 fL   MCH 30.6 26.0 - 34.0 pg   MCHC 33.6 30.0 - 36.0 g/dL   RDW 12.6 11.5 - 15.5 %   Platelets 255 150 - 400 K/uL   nRBC 0.0 0.0 - 0.2 %    Comment: Performed at [facility], [address] [place], [place] [address]  Renal function panel     Status: Abnormal   Collection Time: 06/09/21  5:05 AM  Result Value Ref Range   Sodium 133 (L) 135 - 145 mmol/L   Potassium 4.2 3.5 - 5.1 mmol/L   Chloride 99 98 - 111 mmol/L   CO2 24 22 - 32 mmol/L   Glucose, Bld 92 70 - 99 mg/dL    Comment: Glucose reference range applies only to samples taken after fasting for at least 8 hours.   BUN 30 (H) 8 - 23 mg/dL   Creatinine, Ser 2.55 (H) 0.44 - 1.00  mg/dL   Calcium 9.2 8.9 - 10.3 mg/dL   Phosphorus 4.4 2.5 - 4.6 mg/dL   Albumin 3.4 (L) 3.5 - 5.0 g/dL   GFR, Estimated 19 (L) >60 mL/min    Comment: (NOTE) Calculated using the CKD-EPI Creatinine Equation (2021)    Anion gap 10 5 - 15    Comment: Performed at [facility], [address] [place], [place] [address]  CBC     Status: Abnormal   Collection Time: 06/09/21  5:05 AM  Result Value Ref Range   WBC 6.7 4.0 - 10.5 K/uL   RBC 3.28 (L) 3.87 - 5.11 MIL/uL   Hemoglobin 10.4 (L) 12.0 - 15.0 g/dL   HCT 30.5 (L) 36.0 - 46.0 %   MCV 93.0 80.0 - 100.0 fL   MCH 31.7 26.0 - 34.0 pg   MCHC 34.1 30.0 - 36.0 g/dL   RDW 12.3 11.5 - 15.5 %   Platelets 242 150 - 400 K/uL   nRBC 0.0 0.0 - 0.2 %    Comment: Performed at [facility], [address] [place], [place] [address]  Magnesium     Status: None   Collection Time: 06/09/21  5:05 AM  Result Value Ref Range   Magnesium 2.1 1.7 - 2.4 mg/dL    Comment: Performed at [facility], [address] [place], [place] [address]  Metanephrines, plasma     Status: None   Collection Time: 06/09/21  5:05 AM  Result Value Ref Range   Normetanephrine, Free 227.5 0.0 - 285.2 pg/mL    Comment: (NOTE) This test was developed and its performance characteristics determined by [facility]. It has not been cleared or approved by the Food and Drug Administration.    Metanephrine, Free 35.9 0.0 - 88.0 pg/mL    Comment: (NOTE) This test was developed and its performance characteristics determined by [facility]. It has not been cleared or approved by the Food and Drug Administration. Performed At: [facility] [place], [place] [id number] Rush Farmer MD [id number]   Glucose, capillary     Status: Abnormal   Collection Time: 06/09/21 10:15 AM  Result Value Ref Range   Glucose-Capillary 133 (H) 70 - 99 mg/dL    Comment: Glucose reference range applies only to samples taken after fasting for at least 8 hours.  Troponin I (High Sensitivity)     Status: Abnormal   Collection Time: 06/09/21 10:43 AM  Result Value Ref Range   Troponin I (High Sensitivity) 21 (H) <18 ng/L    Comment: (NOTE) Elevated high sensitivity troponin I (hsTnI) values and significant  changes across serial measurements may  suggest ACS but many other  chronic and acute conditions are known to elevate hsTnI results.  Refer to the "Links" section for chest pain algorithms and additional  guidance. Performed at [facility], [place], [place] [address]   Troponin I (High Sensitivity)     Status: Abnormal   Collection Time: 06/09/21 12:05 PM  Result Value Ref Range   Troponin I (High Sensitivity) 22 (H) <18 ng/L    Comment: (NOTE) Elevated high sensitivity troponin I (hsTnI) values and significant  changes across serial measurements may suggest ACS but many other  chronic and acute conditions are known to elevate hsTnI results.  Refer to the "Links" section for chest pain algorithms and additional  guidance. Performed at [facility], [address] [place], [place] [address]   Metanephrines, urine, 24 hour  Status: None   Collection Time: 06/09/21  4:50 PM  Result Value Ref Range   Normetanephrine, Ur 365 Undefined ug/L    Comment: (NOTE) This test was developed and its performance characteristics determined by [facility]. It has not been cleared or approved by the Food and Drug Administration.    Normetanephrine, 24H Ur 529 131 - 612 ug/24 hr   Metaneph Total, Ur 72 Undefined ug/L    Comment: (NOTE) This test was developed and its performance characteristics determined by [facility]. It has not been cleared or approved by the Food and Drug Administration.    Metanephrines, 24H Ur 104 36 - 209 ug/24 hr    Comment: (NOTE) Performed At: [facility] [place], [place] [id number] Rush Farmer MD [id number]    Total Volume 1,450     Comment: Performed at [facility], [address] [place], [place] [address]  Catecholamines,Ur.,Free,24 Hh     Status: None   Collection Time: 06/09/21  4:50 PM  Result Value Ref Range   Epinephrine, [facility] ug/L    Comment: (NOTE) Test not performed. The specimen received was not  preserved correctly. The pH was too high therefore we are unable to obtain valid results for the following test(s):      Nimisha P notified 06/15/2021 This test was developed and its performance characteristics determined by [facility]. It has not been cleared or approved by the Food and Drug Administration.    Norepinephrine, Rand Ur NOT PERFORMED     Comment: (NOTE) Test not performed This test was developed and its performance characteristics determined by [facility]. It has not been cleared or approved by the Food and Drug Administration.    Dopamine, Rand Ur NOT PERFORMED     Comment: (NOTE) Test not performed This test was developed and its performance characteristics determined by [facility]. It has not been cleared or approved by the Food and Drug Administration.    Total Volume 1,450     Comment: Performed at [facility], [address] [place], [place] [address]  Renal function panel     Status: Abnormal   Collection Time: 06/10/21  5:10 AM  Result Value Ref Range   Sodium 132 (L) 135 - 145 mmol/L   Potassium 4.6 3.5 - 5.1 mmol/L   Chloride 99 98 - 111 mmol/L   CO2 26 22 - 32 mmol/L   Glucose, Bld 96 70 - 99 mg/dL    Comment: Glucose reference range applies only to samples taken after fasting for at least 8 hours.   BUN 30 (H) 8 - 23 mg/dL   Creatinine, Ser 2.31 (H) 0.44 - 1.00 mg/dL   Calcium 8.9 8.9 - 10.3 mg/dL   Phosphorus 3.8 2.5 - 4.6 mg/dL   Albumin 3.2 (L) 3.5 - 5.0 g/dL   GFR, Estimated 22 (L) >60 mL/min    Comment: (NOTE) Calculated using the CKD-EPI Creatinine Equation (2021)    Anion gap 7 5 - 15    Comment: Performed at [facility], [address] [place], [place] [address]  Magnesium     Status: None   Collection Time: 06/10/21  5:10 AM  Result Value Ref Range   Magnesium 2.1 1.7 - 2.4 mg/dL    Comment: Performed at [facility], [address] [place], [place] [address]  CBC     Status: Abnormal   Collection Time:  06/10/21  5:10 AM  Result Value Ref Range   WBC 6.7 4.0 -  10.5 K/uL   RBC 3.17 (L) 3.87 - 5.11 MIL/uL   Hemoglobin 9.5 (L) 12.0 - 15.0 g/dL   HCT 29.1 (L) 36.0 - 46.0 %   MCV 91.8 80.0 - 100.0 fL   MCH 30.0 26.0 - 34.0 pg   MCHC 32.6 30.0 - 36.0 g/dL   RDW 12.2 11.5 - 15.5 %   Platelets 229 150 - 400 K/uL   nRBC 0.0 0.0 - 0.2 %    Comment: Performed at [facility], [address] [place], [place] [address]  Aldosterone + renin activity w/ ratio     Status: None   Collection Time: 06/10/21  5:10 AM  Result Value Ref Range   PRA LC/MS/MS 0.241 0.167 - 5.380 ng/mL/hr    Comment: (NOTE) This test was developed and its performance characteristics determined by [facility]. It has not been cleared or approved by the Food and Drug Administration.    ALDO / PRA Ratio 24.1 0.0 - 30.0    Comment: (NOTE)                         Units:      ng/dL per ng/mL/hr Performed At: [facility] [place], [place] [id number] Rush Farmer MD [id number]    Aldosterone 5.8 0.0 - 30.0 ng/dL    Comment: (NOTE) This test was developed and its performance characteristics determined by [facility]. It has not been cleared or approved by the Food and Drug Administration.   CBC     Status: Abnormal   Collection Time: 06/11/21  5:48 AM  Result Value Ref Range   WBC 7.2 4.0 - 10.5 K/uL   RBC 3.15 (L) 3.87 - 5.11 MIL/uL   Hemoglobin 9.6 (L) 12.0 - 15.0 g/dL   HCT 28.6 (L) 36.0 - 46.0 %   MCV 90.8 80.0 - 100.0 fL   MCH 30.5 26.0 - 34.0 pg   MCHC 33.6 30.0 - 36.0 g/dL   RDW 12.3 11.5 - 15.5 %   Platelets 230 150 - 400 K/uL   nRBC 0.0 0.0 - 0.2 %    Comment: Performed at [facility], [address], [place], [place] [id number]  Basic metabolic panel     Status: Abnormal   Collection Time: 06/11/21  5:48 AM  Result Value Ref Range   Sodium 130 (L) 135 - 145 mmol/L   Potassium 4.3 3.5 - 5.1 mmol/L   Chloride 97 (L) 98 - 111 mmol/L   CO2 24 22 - 32 mmol/L    Glucose, Bld 92 70 - 99 mg/dL    Comment: Glucose reference range applies only to samples taken after fasting for at least 8 hours.   BUN 31 (H) 8 - 23 mg/dL   Creatinine, Ser 2.16 (H) 0.44 - 1.00 mg/dL   Calcium 9.2 8.9 - 10.3 mg/dL   GFR, Estimated 23 (L) >60 mL/min    Comment: (NOTE) Calculated using the CKD-EPI Creatinine Equation (2021)    Anion gap 9 5 - 15    Comment: Performed at [facility], [address] [place], [place] [address]  CBC     Status: Abnormal   Collection Time: 06/12/21  8:02 AM  Result Value Ref Range   WBC 9.5 4.0 - 10.5 K/uL   RBC 3.33 (L) 3.87 - 5.11 MIL/uL   Hemoglobin 10.3 (L) 12.0 - 15.0 g/dL   HCT 30.7 (L) 36.0 - 46.0 %   MCV 92.2 80.0 -  100.0 fL   MCH 30.9 26.0 - 34.0 pg   MCHC 33.6 30.0 - 36.0 g/dL   RDW 12.7 11.5 - 15.5 %   Platelets 224 150 - 400 K/uL   nRBC 0.0 0.0 - 0.2 %    Comment: Performed at [facility], [address] [place], [place] [id number]  Basic metabolic panel     Status: Abnormal   Collection Time: 06/12/21  8:02 AM  Result Value Ref Range   Sodium 130 (L) 135 - 145 mmol/L   Potassium 4.8 3.5 - 5.1 mmol/L   Chloride 98 98 - 111 mmol/L   CO2 23 22 - 32 mmol/L   Glucose, Bld 91 70 - 99 mg/dL    Comment: Glucose reference range applies only to samples taken after fasting for at least 8 hours.   BUN 41 (H) 8 - 23 mg/dL   Creatinine, Ser 4.04 (H) 0.44 - 1.00 mg/dL   Calcium 9.0 8.9 - 10.3 mg/dL   GFR, Estimated 11 (L) >60 mL/min    Comment: (NOTE) Calculated using the CKD-EPI Creatinine Equation (2021)    Anion gap 9 5 - 15    Comment: Performed at [facility], [address] [place], [place] [address]  CBC     Status: Abnormal   Collection Time: 06/13/21  6:42 AM  Result Value Ref Range   WBC 12.4 (H) 4.0 - 10.5 K/uL   RBC 2.99 (L) 3.87 - 5.11 MIL/uL   Hemoglobin 8.9 (L) 12.0 - 15.0 g/dL   HCT 27.3 (L) 36.0 - 46.0 %   MCV 91.3 80.0 - 100.0 fL   MCH 29.8 26.0 - 34.0 pg   MCHC 32.6  30.0 - 36.0 g/dL   RDW 12.5 11.5 - 15.5 %   Platelets 200 150 - 400 K/uL   nRBC 0.0 0.0 - 0.2 %    Comment: Performed at [facility], [address] [place], [place] [id number]  Basic metabolic panel     Status: Abnormal   Collection Time: 06/13/21  6:42 AM  Result Value Ref Range   Sodium 129 (L) 135 - 145 mmol/L   Potassium 4.4 3.5 - 5.1 mmol/L   Chloride 96 (L) 98 - 111 mmol/L   CO2 20 (L) 22 - 32 mmol/L   Glucose, Bld 88 70 - 99 mg/dL    Comment: Glucose reference range applies only to samples taken after fasting for at least 8 hours.   BUN 48 (H) 8 - 23 mg/dL   Creatinine, Ser 5.05 (H) 0.44 - 1.00 mg/dL   Calcium 8.3 (L) 8.9 - 10.3 mg/dL   GFR, Estimated 8 (L) >60 mL/min    Comment: (NOTE) Calculated using the CKD-EPI Creatinine Equation (2021)    Anion gap 13 5 - 15    Comment: Performed at [facility], [address] [place], [place] [address]  CBC     Status: Abnormal   Collection Time: 06/14/21  6:51 AM  Result Value Ref Range   WBC 9.7 4.0 - 10.5 K/uL   RBC 2.65 (L) 3.87 - 5.11 MIL/uL   Hemoglobin 8.5 (L) 12.0 - 15.0 g/dL   HCT 24.8 (L) 36.0 - 46.0 %   MCV 93.6 80.0 - 100.0 fL   MCH 32.1 26.0 - 34.0 pg   MCHC 34.3 30.0 - 36.0 g/dL   RDW 12.7 11.5 - 15.5 %   Platelets 187 150 - 400 K/uL   nRBC 0.0 0.0 - 0.2 %  Comment: Performed at [facility], [address] [place], [place] [id number]  Basic metabolic panel     Status: Abnormal   Collection Time: 06/14/21  6:51 AM  Result Value Ref Range   Sodium 129 (L) 135 - 145 mmol/L   Potassium 4.6 3.5 - 5.1 mmol/L   Chloride 99 98 - 111 mmol/L   CO2 19 (L) 22 - 32 mmol/L   Glucose, Bld 95 70 - 99 mg/dL    Comment: Glucose reference range applies only to samples taken after fasting for at least 8 hours.   BUN 48 (H) 8 - 23 mg/dL   Creatinine, Ser 5.67 (H) 0.44 - 1.00 mg/dL   Calcium 8.3 (L) 8.9 - 10.3 mg/dL   GFR, Estimated 7 (L) >60 mL/min    Comment: (NOTE) Calculated using the  CKD-EPI Creatinine Equation (2021)    Anion gap 11 5 - 15    Comment: Performed at [facility], [address] [place], [place] [address]  CBC     Status: Abnormal   Collection Time: 06/15/21  6:12 AM  Result Value Ref Range   WBC 8.4 4.0 - 10.5 K/uL   RBC 2.59 (L) 3.87 - 5.11 MIL/uL   Hemoglobin 8.1 (L) 12.0 - 15.0 g/dL   HCT 24.2 (L) 36.0 - 46.0 %   MCV 93.4 80.0 - 100.0 fL   MCH 31.3 26.0 - 34.0 pg   MCHC 33.5 30.0 - 36.0 g/dL   RDW 12.5 11.5 - 15.5 %   Platelets 202 150 - 400 K/uL   nRBC 0.0 0.0 - 0.2 %    Comment: Performed at [facility], [address], [place], [place] [id number]  Basic metabolic panel     Status: Abnormal   Collection Time: 06/15/21  6:12 AM  Result Value Ref Range   Sodium 129 (L) 135 - 145 mmol/L   Potassium 4.7 3.5 - 5.1 mmol/L   Chloride 100 98 - 111 mmol/L   CO2 19 (L) 22 - 32 mmol/L   Glucose, Bld 93 70 - 99 mg/dL    Comment: Glucose reference range applies only to samples taken after fasting for at least 8 hours.   BUN 47 (H) 8 - 23 mg/dL   Creatinine, Ser 5.12 (H) 0.44 - 1.00 mg/dL   Calcium 8.3 (L) 8.9 - 10.3 mg/dL   GFR, Estimated 8 (L) >60 mL/min    Comment: (NOTE) Calculated using the CKD-EPI Creatinine Equation (2021)    Anion gap 10 5 - 15    Comment: Performed at [facility], [address] [place], [place] [address]  CBC     Status: Abnormal   Collection Time: 06/16/21  4:42 AM  Result Value Ref Range   WBC 8.1 4.0 - 10.5 K/uL   RBC 2.44 (L) 3.87 - 5.11 MIL/uL   Hemoglobin 7.6 (L) 12.0 - 15.0 g/dL   HCT 22.8 (L) 36.0 - 46.0 %   MCV 93.4 80.0 - 100.0 fL   MCH 31.1 26.0 - 34.0 pg   MCHC 33.3 30.0 - 36.0 g/dL   RDW 12.4 11.5 - 15.5 %   Platelets 222 150 - 400 K/uL   nRBC 0.0 0.0 - 0.2 %    Comment: Performed at [facility], [address] [place], [place] [id number]  Basic metabolic panel     Status: Abnormal   Collection Time: 06/16/21  4:42 AM  Result Value Ref Range   Sodium 130  (L) 135 -  145 mmol/L   Potassium 4.8 3.5 - 5.1 mmol/L   Chloride 103 98 - 111 mmol/L   CO2 18 (L) 22 - 32 mmol/L   Glucose, Bld 85 70 - 99 mg/dL    Comment: Glucose reference range applies only to samples taken after fasting for at least 8 hours.   BUN 44 (H) 8 - 23 mg/dL   Creatinine, Ser 4.95 (H) 0.44 - 1.00 mg/dL   Calcium 8.3 (L) 8.9 - 10.3 mg/dL   GFR, Estimated 9 (L) >60 mL/min    Comment: (NOTE) Calculated using the CKD-EPI Creatinine Equation (2021)    Anion gap 9 5 - 15    Comment: Performed at [facility], [address] [place], [place] [address]  CBC     Status: Abnormal   Collection Time: 06/17/21  5:14 AM  Result Value Ref Range   WBC 6.8 4.0 - 10.5 K/uL   RBC 2.52 (L) 3.87 - 5.11 MIL/uL   Hemoglobin 7.6 (L) 12.0 - 15.0 g/dL   HCT 23.6 (L) 36.0 - 46.0 %   MCV 93.7 80.0 - 100.0 fL   MCH 30.2 26.0 - 34.0 pg   MCHC 32.2 30.0 - 36.0 g/dL   RDW 12.5 11.5 - 15.5 %   Platelets 219 150 - 400 K/uL   nRBC 0.0 0.0 - 0.2 %    Comment: Performed at [facility], [address], [place], [place] [id number]  Basic metabolic panel     Status: Abnormal   Collection Time: 06/17/21  5:14 AM  Result Value Ref Range   Sodium 129 (L) 135 - 145 mmol/L   Potassium 5.1 3.5 - 5.1 mmol/L   Chloride 106 98 - 111 mmol/L   CO2 19 (L) 22 - 32 mmol/L   Glucose, Bld 87 70 - 99 mg/dL    Comment: Glucose reference range applies only to samples taken after fasting for at least 8 hours.   BUN 37 (H) 8 - 23 mg/dL   Creatinine, Ser 4.14 (H) 0.44 - 1.00 mg/dL   Calcium 8.4 (L) 8.9 - 10.3 mg/dL   GFR, Estimated 11 (L) >60 mL/min    Comment: (NOTE) Calculated using the CKD-EPI Creatinine Equation (2021)    Anion gap 4 (L) 5 - 15    Comment: Performed at [facility], [address] [place], [place] [address]  Ferritin     Status: None   Collection Time: 06/17/21  5:14 AM  Result Value Ref Range   Ferritin 169 11 - 307 ng/mL    Comment: Performed at [facility], [address] [place], [place] [address]     PHQ2/9: Depression screen [facility] 2/9 06/19/2021 12/16/2020 06/18/2020 06/10/2020 12/17/2019  Decreased Interest 0 0 0 0 0  Down, Depressed, Hopeless 0 0 0 0 0  PHQ - 2 Score 0 0 0 0 0  Altered sleeping 0 0 - - 0  Tired, decreased energy 3 0 - - 0  Change in appetite 0 0 - - 3  Feeling bad or failure about yourself  0 0 - - 0  Trouble concentrating 0 0 - - 0  Moving slowly or fidgety/restless 0 0 - - 0  Suicidal thoughts 0 0 - - 0  PHQ-9 Score 3 0 - - 3  Difficult doing work/chores - - - - Not difficult at all  Some recent data might be hidden    phq 9 is negative   Fall Risk: Fall Risk  06/19/2021 12/16/2020 06/18/2020 06/10/2020 12/17/2019  Falls in the past year? 0 0 0 0 0  Number falls in past yr: 0 0 0 0 0  Injury with Fall? 0 0 0 0 0  Risk for fall due to : Impaired mobility - - No Fall Risks -  Risk for fall due to: Comment - - - - -  Follow up Falls prevention discussed - - Falls prevention discussed Falls evaluation completed      Functional Status Survey: Is the patient deaf or have difficulty hearing?: Yes Does the patient have difficulty seeing, even when wearing glasses/contacts?: No Does the patient have difficulty concentrating, remembering, or making decisions?: No Does the patient have difficulty walking or climbing stairs?: Yes Does the patient have difficulty dressing or bathing?: Yes Does the patient have difficulty doing errands alone such as visiting a doctor's office or shopping?: Yes    Assessment & Plan  1. Hospital discharge follow-up  - AMB Referral to [facility] to assist with cost of medications. Clonidine and Veltassa   2. History of stent insertion of renal artery  She is on statin therapy but LDL not at goa - ezetimibe (ZETIA) 10 MG tablet; Take 1 tablet (10 mg total) by mouth daily.  Dispense: 90 tablet; Refill: 1  3. Acute kidney injury superimposed on chronic  kidney disease ([place])  She wants to hold off for labs , she will get it done with nephrologist   4. Hyponatremia  Reminded her to not drink too much free water   5. Financial difficulties  - AMB Referral to [place]

## 2021-06-19 ENCOUNTER — Telehealth: Payer: Self-pay | Admitting: *Deleted

## 2021-06-19 ENCOUNTER — Encounter: Payer: Self-pay | Admitting: Family Medicine

## 2021-06-19 ENCOUNTER — Ambulatory Visit (INDEPENDENT_AMBULATORY_CARE_PROVIDER_SITE_OTHER): Payer: Medicare Other | Admitting: Family Medicine

## 2021-06-19 ENCOUNTER — Other Ambulatory Visit: Payer: Self-pay

## 2021-06-19 ENCOUNTER — Ambulatory Visit: Payer: Medicare Other | Admitting: Family Medicine

## 2021-06-19 VITALS — BP 132/84 | HR 73 | Temp 98.2°F | Resp 16 | Ht 65.0 in | Wt 196.0 lb

## 2021-06-19 DIAGNOSIS — N189 Chronic kidney disease, unspecified: Secondary | ICD-10-CM

## 2021-06-19 DIAGNOSIS — N179 Acute kidney failure, unspecified: Secondary | ICD-10-CM | POA: Diagnosis not present

## 2021-06-19 DIAGNOSIS — Z9889 Other specified postprocedural states: Secondary | ICD-10-CM | POA: Diagnosis not present

## 2021-06-19 DIAGNOSIS — E871 Hypo-osmolality and hyponatremia: Secondary | ICD-10-CM | POA: Diagnosis not present

## 2021-06-19 DIAGNOSIS — Z09 Encounter for follow-up examination after completed treatment for conditions other than malignant neoplasm: Secondary | ICD-10-CM

## 2021-06-19 DIAGNOSIS — Z599 Problem related to housing and economic circumstances, unspecified: Secondary | ICD-10-CM

## 2021-06-19 MED ORDER — EZETIMIBE 10 MG PO TABS: 10.0000 mg | ORAL_TABLET | Freq: Every day | ORAL | 1 refills | Status: DC

## 2021-06-19 NOTE — Chronic Care Management (AMB) (Signed)
  Chronic Care Management   Note  06/19/2021 Name: Betty Nielsen MRN: [id number] DOB: 1945-12-03  Betty Nielsen is a 75 y.o. year old female who is a primary care patient of Steele Sizer, MD. I reached out to Julio Sicks by phone today in response to a referral sent by Betty Nielsen's PCP.  Betty Nielsen was given information about Chronic Care Management services today including:  CCM service includes personalized support from designated clinical staff supervised by her physician, including individualized plan of care and coordination with other care providers 24/7 contact phone numbers for assistance for urgent and routine care needs. Service will only be billed when office clinical staff spend 20 minutes or more in a month to coordinate care. Only one practitioner may furnish and bill the service in a calendar month. The patient may stop CCM services at any time (effective at the end of the month) by phone call to the office staff. The patient is responsible for co-pay (up to 20% after annual deductible is met) if co-pay is required by the individual health plan.   Patient agreed to services and verbal consent obtained.   Follow up plan: Telephone appointment with care management team member scheduled for: 06/25/2021 and 07/02/2021  Julian Hy, [place] [facility]  Direct Dial: [phone]

## 2021-06-25 ENCOUNTER — Telehealth: Payer: Medicare Other

## 2021-07-01 ENCOUNTER — Ambulatory Visit (INDEPENDENT_AMBULATORY_CARE_PROVIDER_SITE_OTHER): Payer: Medicare Other | Admitting: Family Medicine

## 2021-07-01 ENCOUNTER — Other Ambulatory Visit: Payer: Self-pay

## 2021-07-01 ENCOUNTER — Telehealth: Payer: Self-pay

## 2021-07-01 ENCOUNTER — Encounter: Payer: Self-pay | Admitting: Family Medicine

## 2021-07-01 VITALS — BP 134/86 | HR 89 | Temp 97.9°F | Resp 16 | Ht 65.0 in | Wt 196.0 lb

## 2021-07-01 DIAGNOSIS — J302 Other seasonal allergic rhinitis: Secondary | ICD-10-CM

## 2021-07-01 DIAGNOSIS — Z8673 Personal history of transient ischemic attack (TIA), and cerebral infarction without residual deficits: Secondary | ICD-10-CM | POA: Diagnosis not present

## 2021-07-01 DIAGNOSIS — Z8679 Personal history of other diseases of the circulatory system: Secondary | ICD-10-CM

## 2021-07-01 DIAGNOSIS — N1832 Chronic kidney disease, stage 3b: Secondary | ICD-10-CM

## 2021-07-01 DIAGNOSIS — N183 Chronic kidney disease, stage 3 unspecified: Secondary | ICD-10-CM

## 2021-07-01 DIAGNOSIS — I129 Hypertensive chronic kidney disease with stage 1 through stage 4 chronic kidney disease, or unspecified chronic kidney disease: Secondary | ICD-10-CM

## 2021-07-01 DIAGNOSIS — I10 Essential (primary) hypertension: Secondary | ICD-10-CM

## 2021-07-01 DIAGNOSIS — I209 Angina pectoris, unspecified: Secondary | ICD-10-CM

## 2021-07-01 DIAGNOSIS — E890 Postprocedural hypothyroidism: Secondary | ICD-10-CM

## 2021-07-01 DIAGNOSIS — I700 Atherosclerosis of aorta: Secondary | ICD-10-CM

## 2021-07-01 DIAGNOSIS — J3089 Other allergic rhinitis: Secondary | ICD-10-CM

## 2021-07-01 DIAGNOSIS — E785 Hyperlipidemia, unspecified: Secondary | ICD-10-CM

## 2021-07-01 LAB — LIPID PANEL
Cholesterol: 217 mg/dL — ABNORMAL HIGH (ref <200)
HDL: 75 mg/dL (ref >=50)
LDL Cholesterol (Calc): 124 mg/dL (calc) — ABNORMAL HIGH
Non-HDL Cholesterol (Calc): 142 mg/dL (calc) — ABNORMAL HIGH (ref <130)
Total CHOL/HDL Ratio: 2.9 (calc) (ref <5.0)
Triglycerides: 83 mg/dL (ref <150)

## 2021-07-01 LAB — TSH: TSH: 2.46 mIU/L (ref 0.40–4.50)

## 2021-07-01 MED ORDER — ATORVASTATIN CALCIUM 40 MG PO TABS: 40.0000 mg | ORAL_TABLET | Freq: Every day | ORAL | 1 refills | Status: DC

## 2021-07-01 NOTE — Progress Notes (Signed)
Name: Betty Nielsen   MRN: [id number]    DOB: 08/06/1946   Date:07/01/2021       Progress Note  Subjective  Chief Complaint  Follow Up  HPI  CKI III/ Hyponatremia: she is under the care of  [facility], she was hospitalized end of 05/2021 and developed acute on chronic kidney disease,GFR was as low as 7 and during last visit with Dr. Holley Raring on Oct 4 th it was up to 4. She had a renal artery stenosis, status post stent placement and Dr. Holley Raring thinks drop in kidney function likely multifactorial , HCT still down to 25.5 and was referred to hematologist    HTN/CAD/Angina/Stroke: bp today is in good control, taking bp medications as prescribed,  palpitation or dizziness, no recent episodes of chest pain .  She has not used any NTG since Feb 2018 ( when she had MI). She had a Stroke 10/2019 , she is back on  plavix and is taking statin therapy and denies side effects. She has anemia and will see hematologist soon Dr. Holley Raring added Nifedipine and will switch from clonidine path to oral pills once she runs out of rx of patch ( due to cost) and Labeltolol, she is on HcTZ ( given by cardiologist - even though she has hyponatremia) , she did not bring her medications with her today .    Atherosclerosis abdominal aorta/hyperlipidemia: she has been taking Atorvastatin and plavix, no longer having problems with statin therapy,  history of aneurysmal of aorta repair done by Dr. Doren Custard ( vascular surgeon in [place]), also had stent placed on right renal artery 05/2021    Hyperlipidemia: since 10/2019 she has been taking Atorvastatin and denies side effects of medication last LDL was down from 226 to 104. Explained goal is below 70   Hypothyroidism: she is s/p thyroidectomy Compliant with medication, denies dry skin, states constipation under control with dietary modification and increase in fiber diet. . Last TSH was at goal 05/2020  , she is taking Synthroid 75 mcg daily , we will recheck TSH today     Depression: she is off SSRI because of hyponatremia, but states she is feeling well, in remission. 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 She denies sadness, crying spells, change in appetite, or suicidal thoughts. Phq 9 today is zero, she has been in remission Unchanged   Metabolic syndrome: Last [id number] was 5.7 %. She denies polyphagia, polydipsia or polyuria   AR: she has year round symptoms, she has been taking zyrtec, states symptoms okay at this time  Patient Active Problem List   Diagnosis Date Noted   Acute kidney injury superimposed on CKD ([place])    Hyperkalemia    Hypothyroidism    Accelerated hypertension 06/09/2021   Left upper extremity numbness 06/07/2021   Left facial numbness 06/07/2021   Elevated serum creatinine 06/07/2021   CVA (cerebral vascular accident) ([place]) 06/06/2021   Benign hypertension with chronic kidney disease, stage III ([place]) 06/18/2020   Anemia of chronic disease 11/21/2019   Stage 3b chronic kidney disease ([place]) 11/21/2019   Hypo-osmolality and hyponatremia 11/21/2019   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   History of stroke 10/27/2019   PMR (polymyalgia rheumatica) ([place]) 06/15/2017   Headache 10/27/2016   CKD (chronic kidney disease), stage III ([place]) 10/27/2016   Hyperlipidemia 10/27/2016   Hyponatremia 10/27/2016   Angina pectoris ([place]) 10/25/2016   History of non-ST elevation myocardial infarction (NSTEMI) 10/25/2016   History of coronary  angioplasty 10/25/2016   BPV (benign positional vertigo) 06/15/2016   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H[facility]) 11/21/2015   Acid reflux 06/05/2015   Benign secondary hypertension due to renal artery stenosis ([place]) 06/05/2015   Hypothyroidism, 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   Major depression in remission ([place]) 06/05/2015   Obesity (BMI 30.0-34.9) 06/05/2015   Neuropathy 06/05/2015   Perennial allergic rhinitis with seasonal variation 06/05/2015   Arthritis, degenerative 07/03/2014    AAA (abdominal aortic aneurysm) without rupture 06/26/2014    Past Surgical History:  Procedure Laterality Date   ABDOMINAL AORTIC ANEURYSM REPAIR     ABDOMINAL HYSTERECTOMY     APPENDECTOMY     CORONARY STENT INTERVENTION N/A 10/26/2016   Procedure: Coronary Stent Intervention;  Surgeon: Yolonda Kida, MD;  Location: [facility];  Service: Cardiovascular;  Laterality: N/A;   HERNIA REPAIR     INNER EAR SURGERY     pt not sure of type   LEFT HEART CATH AND CORONARY ANGIOGRAPHY N/A 10/26/2016   Procedure: Left Heart Cath and Coronary Angiography;  Surgeon: Teodoro Spray, MD;  Location: [facility];  Service: Cardiovascular;  Laterality: N/A;   RENAL ANGIOGRAPHY N/A 06/11/2021   Procedure: RENAL ANGIOGRAPHY;  Surgeon: Algernon Huxley, MD;  Location: [facility];  Service: Cardiovascular;  Laterality: N/A;   THYROIDECTOMY      Family History  Problem Relation Age of Onset   Healthy Mother    Cerebral aneurysm Father     Social History   Tobacco Use   Smoking status: Former    Packs/day: 0.50    Years: 20.00    Pack years: 10.00    Types: Cigarettes    Quit date: 2000    Years since quitting: 22.7   Smokeless tobacco: Never   Tobacco comments:    smoking cessation materials not required  Substance Use Topics   Alcohol use: No    Alcohol/week: 0.0 standard drinks     Current Outpatient Medications:    aspirin EC 81 MG EC tablet, Take 1 tablet (81 mg total) by mouth daily. Swallow whole., Disp: 30 tablet, Rfl: 0   cetirizine (ZYRTEC) 10 MG tablet, Take 1 tablet (10 mg total) by mouth daily as needed. PRN only, Disp: , Rfl:    Cholecalciferol 25 MCG (1000 UT) tablet, Take 1,000 Units by mouth daily. , Disp: , Rfl:    cloNIDine (CATAPRES - DOSED IN MG/24 HR) 0.2 mg/24hr patch, Place 1 patch (0.2 mg total) onto the skin every 7 (seven) days., Disp: 4 patch, Rfl: 0   clopidogrel (PLAVIX) 75 MG tablet, TAKE 1 TABLET BY MOUTH ONCE DAILY. BLOOD THINNER TO  HELP REDUCE STROKE RISK, Disp: 90 tablet, Rfl: 1   ezetimibe (ZETIA) 10 MG tablet, Take 1 tablet (10 mg total) by mouth daily., Disp: 90 tablet, Rfl: 1   hydrochlorothiazide (MICROZIDE) 12.5 MG capsule, Take 1 capsule (12.5 mg total) by mouth daily., Disp: 30 capsule, Rfl: 0   labetalol (NORMODYNE) 200 MG tablet, Take 1 tablet (200 mg total) by mouth 2 (two) times daily., Disp: 60 tablet, Rfl: 0   levothyroxine (SYNTHROID) 75 MCG tablet, Take 1 tablet (75 mcg total) by mouth daily before breakfast., Disp: 90 tablet, Rfl: 1   nitroGLYCERIN (NITROSTAT) 0.4 MG SL tablet, Place under the tongue., Disp: , Rfl:    Omega-3 Fatty Acids (FISH OIL) 1000 MG CPDR, Take 2 capsules by mouth daily., Disp: , Rfl:    patiromer (VELTASSA) 8.4 g packet, Take  1 packet (8.4 g total) by mouth daily., Disp: 30 each, Rfl: 0   atorvastatin (LIPITOR) 40 MG tablet, Take 1 tablet (40 mg total) by mouth daily at 6 PM. Cholesterol medication., Disp: 90 tablet, Rfl: 1  Allergies  Allergen Reactions   Ace Inhibitors Cough   Ciprofloxacin Other (See Comments)    unknown   Citalopram     hyponatremia   Hydrocodone-Acetaminophen Other (See Comments)    unknown   Norvasc [Amlodipine Besylate]     Pruritus    Pantoprazole Sodium Other (See Comments)    Cramps   Pravastatin Other (See Comments)    Stomach cramps   Sulfa Antibiotics     unknown   Clonidine Other (See Comments)    Per patient told by allergist that would be best to stop but nephrologist recommended her to stay on medication     I personally reviewed active problem list, medication list, allergies, family history, social history, health maintenance with the patient/caregiver today.   ROS  Constitutional: Negative for fever or weight change.  Respiratory: Negative for cough and shortness of breath.   Cardiovascular: Negative for chest pain or palpitations.  Gastrointestinal: Negative for abdominal pain, no bowel changes.  Musculoskeletal: Negative  for gait problem or joint swelling.  Skin: Negative for rash.  Neurological: Negative for dizziness or headache.  No other specific complaints in a complete review of systems (except as listed in HPI above).   Objective  Vitals:   07/01/21 1445  BP: 134/86  Pulse: 89  Resp: 16  Temp: 97.9 F (36.6 C)  SpO2: 97%  Weight: 196 lb (88.9 kg)  Height: '5\' 5"'$  (1.651 m)    Body mass index is 32.62 kg/m.  Physical Exam  Constitutional: Patient appears well-developed and well-nourished. Obese  No distress.  HEENT: head atraumatic, normocephalic, pupils equal and reactive to light, eneck supple Cardiovascular: Normal rate, regular rhythm and normal heart sounds.  No murmur heard. No BLE edema. Pulmonary/Chest: Effort normal and breath sounds normal. No respiratory distress. Abdominal: Soft.  There is no tenderness. Psychiatric: Patient has a normal mood and affect. behavior is normal. Judgment and thought content normal.   Recent Results (from the past 2160 hour(s))  Protime-INR     Status: None   Collection Time: 06/06/21  9:10 PM  Result Value Ref Range   Prothrombin Time 13.2 11.4 - 15.2 seconds   INR 1.0 0.8 - 1.2    Comment: (NOTE) INR goal varies based on device and disease states. Performed at [facility], [address] [place], [place] [address]   APTT     Status: None   Collection Time: 06/06/21  9:10 PM  Result Value Ref Range   aPTT 29 24 - 36 seconds    Comment: Performed at [facility], [address] [place], [place] [address]  CBC     Status: Abnormal   Collection Time: 06/06/21  9:10 PM  Result Value Ref Range   WBC 5.8 4.0 - 10.5 K/uL   RBC 3.18 (L) 3.87 - 5.11 MIL/uL   Hemoglobin 10.0 (L) 12.0 - 15.0 g/dL   HCT 29.9 (L) 36.0 - 46.0 %   MCV 94.0 80.0 - 100.0 fL   MCH 31.4 26.0 - 34.0 pg   MCHC 33.4 30.0 - 36.0 g/dL   RDW 12.4 11.5 - 15.5 %   Platelets 213 150 - 400 K/uL   nRBC 0.0 0.0 - 0.2 %    Comment: Performed at [facility], [address] [place], [place] [address]  Differential     Status: None   Collection Time: 06/06/21  9:10 PM  Result Value Ref Range   Neutrophils Relative % 43 %   Neutro Abs 2.5 1.7 - 7.7 K/uL   Lymphocytes Relative 42 %   Lymphs Abs 2.4 0.7 - 4.0 K/uL   Monocytes Relative 9 %   Monocytes Absolute 0.5 0.1 - 1.0 K/uL   Eosinophils Relative 5 %   Eosinophils Absolute 0.3 0.0 - 0.5 K/uL   Basophils Relative 1 %   Basophils Absolute 0.0 0.0 - 0.1 K/uL   Immature Granulocytes 0 %   Abs Immature Granulocytes 0.01 0.00 - 0.07 K/uL    Comment: Performed at [facility], [address] [place], [place] [address]  Comprehensive metabolic panel     Status: Abnormal   Collection Time: 06/06/21  9:10 PM  Result Value Ref Range   Sodium 131 (L) 135 - 145 mmol/L   Potassium 4.4 3.5 - 5.1 mmol/L   Chloride 98 98 - 111 mmol/L   CO2 25 22 - 32 mmol/L   Glucose, Bld 119 (H) 70 - 99 mg/dL    Comment: Glucose reference range applies only to samples taken after fasting for at least 8 hours.   BUN 26 (H) 8 - 23 mg/dL   Creatinine, Ser 1.98 (H) 0.44 - 1.00 mg/dL   Calcium 9.0 8.9 - 10.3 mg/dL   Total Protein 6.6 6.5 - 8.1 g/dL   Albumin 3.7 3.5 - 5.0 g/dL   AST 20 15 - 41 U/L   ALT 12 0 - 44 U/L   Alkaline Phosphatase 89 38 - 126 U/L   Total Bilirubin 0.6 0.3 - 1.2 mg/dL   GFR, Estimated 26 (L) >60 mL/min    Comment: (NOTE) Calculated using the CKD-EPI Creatinine Equation (2021)    Anion gap 8 5 - 15    Comment: Performed at [facility], [address] [place], [place] [address]  CBG monitoring, ED     Status: Abnormal   Collection Time: 06/06/21  9:10 PM  Result Value Ref Range   Glucose-Capillary 114 (H) 70 - 99 mg/dL    Comment: Glucose reference range applies only to samples taken after fasting for at least 8 hours.  Brain natriuretic peptide     Status: Abnormal   Collection Time: 06/06/21  9:10 PM  Result Value Ref Range   B Natriuretic Peptide  156.9 (H) 0.0 - 100.0 pg/mL    Comment: Performed at [facility], [place], [place] [address]  Troponin I (High Sensitivity)     Status: None   Collection Time: 06/06/21  9:57 PM  Result Value Ref Range   Troponin I (High Sensitivity) 9 <18 ng/L    Comment: (NOTE) Elevated high sensitivity troponin I (hsTnI) values and significant  changes across serial measurements may suggest ACS but many other  chronic and acute conditions are known to elevate hsTnI results.  Refer to the "Links" section for chest pain algorithms and additional  guidance. Performed at [facility], [address] [place], [place] [address]   Resp Panel by RT-PCR (Flu A&B, Covid) Nasopharyngeal Swab     Status: None   Collection Time: 06/06/21  9:57 PM   Specimen: Nasopharyngeal Swab; Nasopharyngeal(NP) swabs in vial transport medium  Result Value Ref Range   SARS Coronavirus 2 by RT PCR NEGATIVE NEGATIVE    Comment: (NOTE) SARS-CoV-2  target nucleic acids are NOT DETECTED.  The SARS-CoV-2 RNA is generally detectable in upper respiratory specimens during the acute phase of infection. The lowest concentration of SARS-CoV-2 viral copies this assay can detect is 138 copies/mL. A negative result does not preclude SARS-Cov-2 infection and should not be used as the sole basis for treatment or other patient management decisions. A negative result may occur with  improper specimen collection/handling, submission of specimen other than nasopharyngeal swab, presence of viral mutation(s) within the areas targeted by this assay, and inadequate number of viral copies(<138 copies/mL). A negative result must be combined with clinical observations, patient history, and epidemiological information. The expected result is Negative.  Fact Sheet for Patients:  EntrepreneurPulse.com.au  Fact Sheet for Healthcare Providers:  IncredibleEmployment.be  This  test is no t yet approved or cleared by the [place] FDA and  has been authorized for detection and/or diagnosis of SARS-CoV-2 by FDA under an Emergency Use Authorization (EUA). This EUA will remain  in effect (meaning this test can be used) for the duration of the COVID-19 declaration under Section 564(b)(1) of the Act, 21 U.S.C.section 360bbb-3(b)(1), unless the authorization is terminated  or revoked sooner.       Influenza A by PCR NEGATIVE NEGATIVE   Influenza B by PCR NEGATIVE NEGATIVE    Comment: (NOTE) The Xpert Xpress SARS-CoV-2/FLU/RSV plus assay is intended as an aid in the diagnosis of influenza from Nasopharyngeal swab specimens and should not be used as a sole basis for treatment. Nasal washings and aspirates are unacceptable for Xpert Xpress SARS-CoV-2/FLU/RSV testing.  Fact Sheet for Patients: EntrepreneurPulse.com.au  Fact Sheet for Healthcare Providers: IncredibleEmployment.be  This test is not yet approved or cleared by the [place] FDA and has been authorized for detection and/or diagnosis of SARS-CoV-2 by FDA under an Emergency Use Authorization (EUA). This EUA will remain in effect (meaning this test can be used) for the duration of the COVID-19 declaration under Section 564(b)(1) of the Act, 21 U.S.C. section 360bbb-3(b)(1), unless the authorization is terminated or revoked.  Performed at [facility], [place], [place] [address]   Troponin I (High Sensitivity)     Status: None   Collection Time: 06/06/21 11:45 PM  Result Value Ref Range   Troponin I (High Sensitivity) 9 <18 ng/L    Comment: (NOTE) Elevated high sensitivity troponin I (hsTnI) values and significant  changes across serial measurements may suggest ACS but many other  chronic and acute conditions are known to elevate hsTnI results.  Refer to the "Links" section for chest pain algorithms and additional   guidance. Performed at [facility] [place], [address] [place], [place] [address]   Magnesium     Status: None   Collection Time: 06/06/21 11:45 PM  Result Value Ref Range   Magnesium 2.0 1.7 - 2.4 mg/dL    Comment: Performed at [facility], [address] [place], [place] [address]  Magnesium     Status: None   Collection Time: 06/07/21  7:40 AM  Result Value Ref Range   Magnesium 2.0 1.7 - 2.4 mg/dL    Comment: Performed at [facility], [address] [place],  [address]  Comprehensive metabolic panel     Status: Abnormal   Collection Time: 06/07/21  7:40 AM  Result Value Ref Range   Sodium 132 (L) 135 - 145 mmol/L   Potassium 4.7 3.5 - 5.1 mmol/L   Chloride 99 98 - 111 mmol/L  CO2 23 22 - 32 mmol/L   Glucose, Bld 109 (H) 70 - 99 mg/dL    Comment: Glucose reference range applies only to samples taken after fasting for at least 8 hours.   BUN 24 (H) 8 - 23 mg/dL   Creatinine, Ser 1.87 (H) 0.44 - 1.00 mg/dL   Calcium 9.6 8.9 - 10.3 mg/dL   Total Protein 6.9 6.5 - 8.1 g/dL   Albumin 3.8 3.5 - 5.0 g/dL   AST 23 15 - 41 U/L   ALT 12 0 - 44 U/L   Alkaline Phosphatase 84 38 - 126 U/L   Total Bilirubin 0.7 0.3 - 1.2 mg/dL   GFR, Estimated 28 (L) >60 mL/min    Comment: (NOTE) Calculated using the CKD-EPI Creatinine Equation (2021)    Anion gap 10 5 - 15    Comment: Performed at [facility], [address] [place], [place] [address]  CBC     Status: Abnormal   Collection Time: 06/07/21  7:40 AM  Result Value Ref Range   WBC 7.8 4.0 - 10.5 K/uL   RBC 3.50 (L) 3.87 - 5.11 MIL/uL   Hemoglobin 10.9 (L) 12.0 - 15.0 g/dL   HCT 32.1 (L) 36.0 - 46.0 %   MCV 91.7 80.0 - 100.0 fL   MCH 31.1 26.0 - 34.0 pg   MCHC 34.0 30.0 - 36.0 g/dL   RDW 12.4 11.5 - 15.5 %   Platelets 233 150 - 400 K/uL   nRBC 0.0 0.0 - 0.2 %    Comment: Performed at [facility], [address] [place], [place] [address]  Hemoglobin A1c     Status:  Abnormal   Collection Time: 06/07/21  7:40 AM  Result Value Ref Range   Hgb A1c MFr Bld 5.7 (H) 4.8 - 5.6 %    Comment: (NOTE) Pre diabetes:          5.7%-6.4%  Diabetes:              >6.4%  Glycemic control for   <7.0% adults with diabetes    Mean Plasma Glucose 116.89 mg/dL    Comment: Performed at [facility], [address] [place], [place] [address]  Lipid panel     Status: Abnormal   Collection Time: 06/07/21  7:40 AM  Result Value Ref Range   Cholesterol 214 (H) 0 - 200 mg/dL   Triglycerides 82 <150 mg/dL   HDL 70 >40 mg/dL   Total CHOL/HDL Ratio 3.1 RATIO   VLDL 16 0 - 40 mg/dL   LDL Cholesterol 128 (H) 0 - 99 mg/dL    Comment:        Total Cholesterol/HDL:CHD Risk Coronary Heart Disease Risk Table                     Men   Women  1/2 Average Risk   3.4   3.3  Average Risk       5.0   4.4  2 X Average Risk   9.6   7.1  3 X Average Risk  23.4   11.0        Use the calculated Patient Ratio above and the CHD Risk Table to determine the patient's CHD Risk.        ATP III CLASSIFICATION (LDL):  <100     mg/dL   Optimal  100-129  mg/dL   Near or Above  Optimal  130-159  mg/dL   Borderline  160-189  mg/dL   High  >190     mg/dL   Very High Performed at [facility], [address] [place], [place] [address]   Urinalysis, Complete w Microscopic     Status: Abnormal   Collection Time: 06/07/21  9:12 AM  Result Value Ref Range   Color, Urine YELLOW YELLOW   APPearance CLEAR CLEAR   Specific Gravity, Urine 1.020 1.005 - 1.030   pH 7.0 5.0 - 8.0   Glucose, UA NEGATIVE NEGATIVE mg/dL   Hgb urine dipstick MODERATE (A) NEGATIVE   Bilirubin Urine NEGATIVE NEGATIVE   Ketones, ur NEGATIVE NEGATIVE mg/dL   Protein, ur 100 (A) NEGATIVE mg/dL   Nitrite NEGATIVE NEGATIVE   Leukocytes,Ua NEGATIVE NEGATIVE   RBC / HPF 21-50 0 - 5 RBC/hpf   WBC, UA 0-5 0 - 5 WBC/hpf   Bacteria, UA NONE SEEN NONE SEEN   Squamous Epithelial / LPF 0-5 0 -  5   Mucus PRESENT     Comment: Performed at [facility], [address] [place], [place] [address]  Sodium, urine, random     Status: None   Collection Time: 06/07/21  9:12 AM  Result Value Ref Range   Sodium, Ur 103 mmol/L    Comment: Performed at [facility], [address] [place], [place] [address]  Creatinine, urine, random     Status: None   Collection Time: 06/07/21  9:12 AM  Result Value Ref Range   Creatinine, Urine 29 mg/dL    Comment: Performed at [facility], [address] [place], [place] [address]  ECHOCARDIOGRAM COMPLETE BUBBLE STUDY     Status: None   Collection Time: 06/07/21 12:11 PM  Result Value Ref Range   Ao pk vel 1.67 m/s   AR max vel 2.57 cm2   AV Peak grad 11.1 mmHg   S' Lateral 3.20 cm   Area-P 1/2 3.39 cm2  Renal function panel     Status: Abnormal   Collection Time: 06/08/21  7:07 AM  Result Value Ref Range   Sodium 131 (L) 135 - 145 mmol/L   Potassium 4.5 3.5 - 5.1 mmol/L   Chloride 98 98 - 111 mmol/L   CO2 23 22 - 32 mmol/L   Glucose, Bld 102 (H) 70 - 99 mg/dL    Comment: Glucose reference range applies only to samples taken after fasting for at least 8 hours.   BUN 26 (H) 8 - 23 mg/dL   Creatinine, Ser 2.51 (H) 0.44 - 1.00 mg/dL   Calcium 9.6 8.9 - 10.3 mg/dL   Phosphorus 4.1 2.5 - 4.6 mg/dL   Albumin 3.9 3.5 - 5.0 g/dL   GFR, Estimated 19 (L) >60 mL/min    Comment: (NOTE) Calculated using the CKD-EPI Creatinine Equation (2021)    Anion gap 10 5 - 15    Comment: Performed at [facility], [address] [place], [place] [address]  Magnesium     Status: None   Collection Time: 06/08/21  7:07 AM  Result Value Ref Range   Magnesium 1.9 1.7 - 2.4 mg/dL    Comment: Performed at [facility], [address] [place],  [address]  CBC     Status: Abnormal   Collection Time: 06/08/21  7:07 AM  Result Value Ref Range   WBC 7.0 4.0 - 10.5 K/uL   RBC 3.53 (L) 3.87 - 5.11 MIL/uL  Hemoglobin 10.8 (L) 12.0 - 15.0 g/dL   HCT 32.1 (L) 36.0 - 46.0 %   MCV 90.9 80.0 - 100.0 fL   MCH 30.6 26.0 - 34.0 pg   MCHC 33.6 30.0 - 36.0 g/dL   RDW 12.6 11.5 - 15.5 %   Platelets 255 150 - 400 K/uL   nRBC 0.0 0.0 - 0.2 %    Comment: Performed at [facility], [address] [place], [place] [address]  Renal function panel     Status: Abnormal   Collection Time: 06/09/21  5:05 AM  Result Value Ref Range   Sodium 133 (L) 135 - 145 mmol/L   Potassium 4.2 3.5 - 5.1 mmol/L   Chloride 99 98 - 111 mmol/L   CO2 24 22 - 32 mmol/L   Glucose, Bld 92 70 - 99 mg/dL    Comment: Glucose reference range applies only to samples taken after fasting for at least 8 hours.   BUN 30 (H) 8 - 23 mg/dL   Creatinine, Ser 2.55 (H) 0.44 - 1.00 mg/dL   Calcium 9.2 8.9 - 10.3 mg/dL   Phosphorus 4.4 2.5 - 4.6 mg/dL   Albumin 3.4 (L) 3.5 - 5.0 g/dL   GFR, Estimated 19 (L) >60 mL/min    Comment: (NOTE) Calculated using the CKD-EPI Creatinine Equation (2021)    Anion gap 10 5 - 15    Comment: Performed at [facility], [address] [place], [place] [address]  CBC     Status: Abnormal   Collection Time: 06/09/21  5:05 AM  Result Value Ref Range   WBC 6.7 4.0 - 10.5 K/uL   RBC 3.28 (L) 3.87 - 5.11 MIL/uL   Hemoglobin 10.4 (L) 12.0 - 15.0 g/dL   HCT 30.5 (L) 36.0 - 46.0 %   MCV 93.0 80.0 - 100.0 fL   MCH 31.7 26.0 - 34.0 pg   MCHC 34.1 30.0 - 36.0 g/dL   RDW 12.3 11.5 - 15.5 %   Platelets 242 150 - 400 K/uL   nRBC 0.0 0.0 - 0.2 %    Comment: Performed at [facility], [address] [place], [place] [address]  Magnesium     Status: None   Collection Time: 06/09/21  5:05 AM  Result Value Ref Range   Magnesium 2.1 1.7 - 2.4 mg/dL    Comment: Performed at [facility], [address] [place], [place] [address]  Metanephrines, plasma     Status: None   Collection Time: 06/09/21  5:05 AM  Result Value Ref Range   Normetanephrine, Free 227.5 0.0 - 285.2  pg/mL    Comment: (NOTE) This test was developed and its performance characteristics determined by [facility]. It has not been cleared or approved by the Food and Drug Administration.    Metanephrine, Free 35.9 0.0 - 88.0 pg/mL    Comment: (NOTE) This test was developed and its performance characteristics determined by [facility]. It has not been cleared or approved by the Food and Drug Administration. Performed At: [facility] [place], [place] [id number] Rush Farmer MD [id number]   Glucose, capillary     Status: Abnormal   Collection Time: 06/09/21 10:15 AM  Result Value Ref Range   Glucose-Capillary 133 (H) 70 - 99 mg/dL    Comment: Glucose reference range applies only to samples taken after fasting for at least 8 hours.  Troponin I (High Sensitivity)     Status: Abnormal   Collection Time: 06/09/21 10:43  AM  Result Value Ref Range   Troponin I (High Sensitivity) 21 (H) <18 ng/L    Comment: (NOTE) Elevated high sensitivity troponin I (hsTnI) values and significant  changes across serial measurements may suggest ACS but many other  chronic and acute conditions are known to elevate hsTnI results.  Refer to the "Links" section for chest pain algorithms and additional  guidance. Performed at [facility], [place], [place] [address]   Troponin I (High Sensitivity)     Status: Abnormal   Collection Time: 06/09/21 12:05 PM  Result Value Ref Range   Troponin I (High Sensitivity) 22 (H) <18 ng/L    Comment: (NOTE) Elevated high sensitivity troponin I (hsTnI) values and significant  changes across serial measurements may suggest ACS but many other  chronic and acute conditions are known to elevate hsTnI results.  Refer to the "Links" section for chest pain algorithms and additional  guidance. Performed at [facility], [address] [place], [place] [address]   Metanephrines, urine, 24 hour     Status: None    Collection Time: 06/09/21  4:50 PM  Result Value Ref Range   Normetanephrine, Ur 365 Undefined ug/L    Comment: (NOTE) This test was developed and its performance characteristics determined by [facility]. It has not been cleared or approved by the Food and Drug Administration.    Normetanephrine, 24H Ur 529 131 - 612 ug/24 hr   Metaneph Total, Ur 72 Undefined ug/L    Comment: (NOTE) This test was developed and its performance characteristics determined by [facility]. It has not been cleared or approved by the Food and Drug Administration.    Metanephrines, 24H Ur 104 36 - 209 ug/24 hr    Comment: (NOTE) Performed At: [facility] [place], [place] [id number] Rush Farmer MD [id number]    Total Volume 1,450     Comment: Performed at [facility], [address] [place], [place] [address]  Catecholamines,Ur.,Free,24 Hh     Status: None   Collection Time: 06/09/21  4:50 PM  Result Value Ref Range   Epinephrine, Vonita Moss [facility] ug/L    Comment: (NOTE) Test not performed. The specimen received was not preserved correctly. The pH was too high therefore we are unable to obtain valid results for the following test(s):      Nimisha P notified 06/15/2021 This test was developed and its performance characteristics determined by [facility]. It has not been cleared or approved by the Food and Drug Administration.    Norepinephrine, Rand Ur NOT PERFORMED     Comment: (NOTE) Test not performed This test was developed and its performance characteristics determined by [facility]. It has not been cleared or approved by the Food and Drug Administration.    Dopamine, Rand Ur NOT PERFORMED     Comment: (NOTE) Test not performed This test was developed and its performance characteristics determined by [facility]. It has not been cleared or approved by the Food and Drug Administration.    Total Volume 1,450     Comment: Performed at [facility], [address] [place], [place] [address]  Renal function panel     Status: Abnormal   Collection Time: 06/10/21  5:10 AM  Result Value Ref Range   Sodium 132 (L) 135 - 145 mmol/L   Potassium 4.6 3.5 - 5.1 mmol/L   Chloride 99 98 - 111 mmol/L   CO2 26 22 - 32 mmol/L   Glucose, Bld  96 70 - 99 mg/dL    Comment: Glucose reference range applies only to samples taken after fasting for at least 8 hours.   BUN 30 (H) 8 - 23 mg/dL   Creatinine, Ser 2.31 (H) 0.44 - 1.00 mg/dL   Calcium 8.9 8.9 - 10.3 mg/dL   Phosphorus 3.8 2.5 - 4.6 mg/dL   Albumin 3.2 (L) 3.5 - 5.0 g/dL   GFR, Estimated 22 (L) >60 mL/min    Comment: (NOTE) Calculated using the CKD-EPI Creatinine Equation (2021)    Anion gap 7 5 - 15    Comment: Performed at [facility], [address] [place], [place] [address]  Magnesium     Status: None   Collection Time: 06/10/21  5:10 AM  Result Value Ref Range   Magnesium 2.1 1.7 - 2.4 mg/dL    Comment: Performed at [facility], [address] [place], [place] [address]  CBC     Status: Abnormal   Collection Time: 06/10/21  5:10 AM  Result Value Ref Range   WBC 6.7 4.0 - 10.5 K/uL   RBC 3.17 (L) 3.87 - 5.11 MIL/uL   Hemoglobin 9.5 (L) 12.0 - 15.0 g/dL   HCT 29.1 (L) 36.0 - 46.0 %   MCV 91.8 80.0 - 100.0 fL   MCH 30.0 26.0 - 34.0 pg   MCHC 32.6 30.0 - 36.0 g/dL   RDW 12.2 11.5 - 15.5 %   Platelets 229 150 - 400 K/uL   nRBC 0.0 0.0 - 0.2 %    Comment: Performed at [facility], ., [place], [place] [address]  Aldosterone + renin activity w/ ratio     Status: None   Collection Time: 06/10/21  5:10 AM  Result Value Ref Range   PRA LC/MS/MS 0.241 0.167 - 5.380 ng/mL/hr    Comment: (NOTE) This test was developed and its performance characteristics determined by [facility]. It has not been cleared or approved by the Food and Drug Administration.    ALDO / PRA Ratio 24.1 0.0 - 30.0    Comment: (NOTE)                         Units:      ng/dL per  ng/mL/hr Performed At: [facility] [place], [place] [id number] Rush Farmer MD [id number]    Aldosterone 5.8 0.0 - 30.0 ng/dL    Comment: (NOTE) This test was developed and its performance characteristics determined by [facility]. It has not been cleared or approved by the Food and Drug Administration.   CBC     Status: Abnormal   Collection Time: 06/11/21  5:48 AM  Result Value Ref Range   WBC 7.2 4.0 - 10.5 K/uL   RBC 3.15 (L) 3.87 - 5.11 MIL/uL   Hemoglobin 9.6 (L) 12.0 - 15.0 g/dL   HCT 28.6 (L) 36.0 - 46.0 %   MCV 90.8 80.0 - 100.0 fL   MCH 30.5 26.0 - 34.0 pg   MCHC 33.6 30.0 - 36.0 g/dL   RDW 12.3 11.5 - 15.5 %   Platelets 230 150 - 400 K/uL   nRBC 0.0 0.0 - 0.2 %    Comment: Performed at [facility], [address], [place], [place] [id number]  Basic metabolic panel     Status: Abnormal   Collection Time: 06/11/21  5:48 AM  Result Value Ref Range   Sodium 130 (L) 135 - 145 mmol/L  Potassium 4.3 3.5 - 5.1 mmol/L   Chloride 97 (L) 98 - 111 mmol/L   CO2 24 22 - 32 mmol/L   Glucose, Bld 92 70 - 99 mg/dL    Comment: Glucose reference range applies only to samples taken after fasting for at least 8 hours.   BUN 31 (H) 8 - 23 mg/dL   Creatinine, Ser 2.16 (H) 0.44 - 1.00 mg/dL   Calcium 9.2 8.9 - 10.3 mg/dL   GFR, Estimated 23 (L) >60 mL/min    Comment: (NOTE) Calculated using the CKD-EPI Creatinine Equation (2021)    Anion gap 9 5 - 15    Comment: Performed at [facility], [address] [place], [place] [address]  CBC     Status: Abnormal   Collection Time: 06/12/21  8:02 AM  Result Value Ref Range   WBC 9.5 4.0 - 10.5 K/uL   RBC 3.33 (L) 3.87 - 5.11 MIL/uL   Hemoglobin 10.3 (L) 12.0 - 15.0 g/dL   HCT 30.7 (L) 36.0 - 46.0 %   MCV 92.2 80.0 - 100.0 fL   MCH 30.9 26.0 - 34.0 pg   MCHC 33.6 30.0 - 36.0 g/dL   RDW 12.7 11.5 - 15.5 %   Platelets 224 150 - 400 K/uL   nRBC 0.0 0.0 - 0.2 %    Comment: Performed at  [facility], [address] [place], [place] [id number]  Basic metabolic panel     Status: Abnormal   Collection Time: 06/12/21  8:02 AM  Result Value Ref Range   Sodium 130 (L) 135 - 145 mmol/L   Potassium 4.8 3.5 - 5.1 mmol/L   Chloride 98 98 - 111 mmol/L   CO2 23 22 - 32 mmol/L   Glucose, Bld 91 70 - 99 mg/dL    Comment: Glucose reference range applies only to samples taken after fasting for at least 8 hours.   BUN 41 (H) 8 - 23 mg/dL   Creatinine, Ser 4.04 (H) 0.44 - 1.00 mg/dL   Calcium 9.0 8.9 - 10.3 mg/dL   GFR, Estimated 11 (L) >60 mL/min    Comment: (NOTE) Calculated using the CKD-EPI Creatinine Equation (2021)    Anion gap 9 5 - 15    Comment: Performed at [facility], [address] [place], [place] [address]  CBC     Status: Abnormal   Collection Time: 06/13/21  6:42 AM  Result Value Ref Range   WBC 12.4 (H) 4.0 - 10.5 K/uL   RBC 2.99 (L) 3.87 - 5.11 MIL/uL   Hemoglobin 8.9 (L) 12.0 - 15.0 g/dL   HCT 27.3 (L) 36.0 - 46.0 %   MCV 91.3 80.0 - 100.0 fL   MCH 29.8 26.0 - 34.0 pg   MCHC 32.6 30.0 - 36.0 g/dL   RDW 12.5 11.5 - 15.5 %   Platelets 200 150 - 400 K/uL   nRBC 0.0 0.0 - 0.2 %    Comment: Performed at [facility], [address], [place], [place] [id number]  Basic metabolic panel     Status: Abnormal   Collection Time: 06/13/21  6:42 AM  Result Value Ref Range   Sodium 129 (L) 135 - 145 mmol/L   Potassium 4.4 3.5 - 5.1 mmol/L   Chloride 96 (L) 98 - 111 mmol/L   CO2 20 (L) 22 - 32 mmol/L   Glucose, Bld 88 70 - 99 mg/dL    Comment: Glucose reference range applies only to samples taken  after fasting for at least 8 hours.   BUN 48 (H) 8 - 23 mg/dL   Creatinine, Ser 5.05 (H) 0.44 - 1.00 mg/dL   Calcium 8.3 (L) 8.9 - 10.3 mg/dL   GFR, Estimated 8 (L) >60 mL/min    Comment: (NOTE) Calculated using the CKD-EPI Creatinine Equation (2021)    Anion gap 13 5 - 15    Comment: Performed at [facility], [address] [place], [place] [address]  CBC     Status: Abnormal   Collection Time: 06/14/21  6:51 AM  Result Value Ref Range   WBC 9.7 4.0 - 10.5 K/uL   RBC 2.65 (L) 3.87 - 5.11 MIL/uL   Hemoglobin 8.5 (L) 12.0 - 15.0 g/dL   HCT 24.8 (L) 36.0 - 46.0 %   MCV 93.6 80.0 - 100.0 fL   MCH 32.1 26.0 - 34.0 pg   MCHC 34.3 30.0 - 36.0 g/dL   RDW 12.7 11.5 - 15.5 %   Platelets 187 150 - 400 K/uL   nRBC 0.0 0.0 - 0.2 %    Comment: Performed at [facility], [address] [place], [place] [id number]  Basic metabolic panel     Status: Abnormal   Collection Time: 06/14/21  6:51 AM  Result Value Ref Range   Sodium 129 (L) 135 - 145 mmol/L   Potassium 4.6 3.5 - 5.1 mmol/L   Chloride 99 98 - 111 mmol/L   CO2 19 (L) 22 - 32 mmol/L   Glucose, Bld 95 70 - 99 mg/dL    Comment: Glucose reference range applies only to samples taken after fasting for at least 8 hours.   BUN 48 (H) 8 - 23 mg/dL   Creatinine, Ser 5.67 (H) 0.44 - 1.00 mg/dL   Calcium 8.3 (L) 8.9 - 10.3 mg/dL   GFR, Estimated 7 (L) >60 mL/min    Comment: (NOTE) Calculated using the CKD-EPI Creatinine Equation (2021)    Anion gap 11 5 - 15    Comment: Performed at [facility], [address] [place], [place] [address]  CBC     Status: Abnormal   Collection Time: 06/15/21  6:12 AM  Result Value Ref Range   WBC 8.4 4.0 - 10.5 K/uL   RBC 2.59 (L) 3.87 - 5.11 MIL/uL   Hemoglobin 8.1 (L) 12.0 - 15.0 g/dL   HCT 24.2 (L) 36.0 - 46.0 %   MCV 93.4 80.0 - 100.0 fL   MCH 31.3 26.0 - 34.0 pg   MCHC 33.5 30.0 - 36.0 g/dL   RDW 12.5 11.5 - 15.5 %   Platelets 202 150 - 400 K/uL   nRBC 0.0 0.0 - 0.2 %    Comment: Performed at [facility], [address] [place], [place] [id number]  Basic metabolic panel     Status: Abnormal   Collection Time: 06/15/21  6:12 AM  Result Value Ref Range   Sodium 129 (L) 135 - 145 mmol/L   Potassium 4.7 3.5 - 5.1 mmol/L   Chloride 100 98 - 111 mmol/L   CO2 19 (L) 22 - 32 mmol/L   Glucose,  Bld 93 70 - 99 mg/dL    Comment: Glucose reference range applies only to samples taken after fasting for at least 8 hours.   BUN 47 (H) 8 - 23 mg/dL   Creatinine, Ser 5.12 (H) 0.44 - 1.00 mg/dL   Calcium 8.3 (L) 8.9 - 10.3 mg/dL   GFR, Estimated 8 (L) >60  mL/min    Comment: (NOTE) Calculated using the CKD-EPI Creatinine Equation (2021)    Anion gap 10 5 - 15    Comment: Performed at [facility], [address] [place], [place] [address]  CBC     Status: Abnormal   Collection Time: 06/16/21  4:42 AM  Result Value Ref Range   WBC 8.1 4.0 - 10.5 K/uL   RBC 2.44 (L) 3.87 - 5.11 MIL/uL   Hemoglobin 7.6 (L) 12.0 - 15.0 g/dL   HCT 22.8 (L) 36.0 - 46.0 %   MCV 93.4 80.0 - 100.0 fL   MCH 31.1 26.0 - 34.0 pg   MCHC 33.3 30.0 - 36.0 g/dL   RDW 12.4 11.5 - 15.5 %   Platelets 222 150 - 400 K/uL   nRBC 0.0 0.0 - 0.2 %    Comment: Performed at [facility], [address] [place], [place] [id number]  Basic metabolic panel     Status: Abnormal   Collection Time: 06/16/21  4:42 AM  Result Value Ref Range   Sodium 130 (L) 135 - 145 mmol/L   Potassium 4.8 3.5 - 5.1 mmol/L   Chloride 103 98 - 111 mmol/L   CO2 18 (L) 22 - 32 mmol/L   Glucose, Bld 85 70 - 99 mg/dL    Comment: Glucose reference range applies only to samples taken after fasting for at least 8 hours.   BUN 44 (H) 8 - 23 mg/dL   Creatinine, Ser 4.95 (H) 0.44 - 1.00 mg/dL   Calcium 8.3 (L) 8.9 - 10.3 mg/dL   GFR, Estimated 9 (L) >60 mL/min    Comment: (NOTE) Calculated using the CKD-EPI Creatinine Equation (2021)    Anion gap 9 5 - 15    Comment: Performed at [facility], [address] [place], [place] [address]  CBC     Status: Abnormal   Collection Time: 06/17/21  5:14 AM  Result Value Ref Range   WBC 6.8 4.0 - 10.5 K/uL   RBC 2.52 (L) 3.87 - 5.11 MIL/uL   Hemoglobin 7.6 (L) 12.0 - 15.0 g/dL   HCT 23.6 (L) 36.0 - 46.0 %   MCV 93.7 80.0 - 100.0 fL   MCH 30.2 26.0 - 34.0 pg   MCHC 32.2 30.0  - 36.0 g/dL   RDW 12.5 11.5 - 15.5 %   Platelets 219 150 - 400 K/uL   nRBC 0.0 0.0 - 0.2 %    Comment: Performed at [facility], [address], [place], [place] [id number]  Basic metabolic panel     Status: Abnormal   Collection Time: 06/17/21  5:14 AM  Result Value Ref Range   Sodium 129 (L) 135 - 145 mmol/L   Potassium 5.1 3.5 - 5.1 mmol/L   Chloride 106 98 - 111 mmol/L   CO2 19 (L) 22 - 32 mmol/L   Glucose, Bld 87 70 - 99 mg/dL    Comment: Glucose reference range applies only to samples taken after fasting for at least 8 hours.   BUN 37 (H) 8 - 23 mg/dL   Creatinine, Ser 4.14 (H) 0.44 - 1.00 mg/dL   Calcium 8.4 (L) 8.9 - 10.3 mg/dL   GFR, Estimated 11 (L) >60 mL/min    Comment: (NOTE) Calculated using the CKD-EPI Creatinine Equation (2021)    Anion gap 4 (L) 5 - 15    Comment: Performed at [facility], [address], [place], [place] [address]  Ferritin  Status: None   Collection Time: 06/17/21  5:14 AM  Result Value Ref Range   Ferritin 169 11 - 307 ng/mL    Comment: Performed at [facility], [address] [place], [place] [address]     PHQ2/9: Depression screen [facility] 2/9 07/01/2021 06/19/2021 12/16/2020 06/18/2020 06/10/2020  Decreased Interest 0 0 0 0 0  Down, Depressed, Hopeless 0 0 0 0 0  PHQ - 2 Score 0 0 0 0 0  Altered sleeping - 0 0 - -  Tired, decreased energy - 3 0 - -  Change in appetite - 0 0 - -  Feeling bad or failure about yourself  - 0 0 - -  Trouble concentrating - 0 0 - -  Moving slowly or fidgety/restless - 0 0 - -  Suicidal thoughts - 0 0 - -  PHQ-9 Score - 3 0 - -  Difficult doing work/chores - - - - -  Some recent data might be hidden    phq 9 is negative   Fall Risk: Fall Risk  07/01/2021 06/19/2021 12/16/2020 06/18/2020 06/10/2020  Falls in the past year? 0 0 0 0 0  Number falls in past yr: - 0 0 0 0  Injury with Fall? 0 0 0 0 0  Risk for fall due to : Impaired mobility Impaired mobility - - No Fall Risks   Risk for fall due to: Comment - - - - -  Follow up Falls prevention discussed Falls prevention discussed - - Falls prevention discussed      Functional Status Survey: Is the patient deaf or have difficulty hearing?: Yes Does the patient have difficulty seeing, even when wearing glasses/contacts?: No Does the patient have difficulty concentrating, remembering, or making decisions?: No Does the patient have difficulty walking or climbing stairs?: Yes Does the patient have difficulty dressing or bathing?: Yes Does the patient have difficulty doing errands alone such as visiting a doctor's office or shopping?: Yes    Assessment & Plan  1. 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HCC)  - atorvastatin (LIPITOR) 40 MG tablet; Take 1 tablet (40 mg total) by mouth daily at 6 PM. Cholesterol medication.  Dispense: 90 tablet; Refill: 1 - Lipid Profile  2. History of lacunar cerebrovascular accident (CVA)  - atorvastatin (LIPITOR) 40 MG tablet; Take 1 tablet (40 mg total) by mouth daily at 6 PM. Cholesterol medication.  Dispense: 90 tablet; Refill: 1  3. Angina pectoris (HCC)  - atorvastatin (LIPITOR) 40 MG tablet; Take 1 tablet (40 mg total) by mouth daily at 6 PM. Cholesterol medication.  Dispense: 90 tablet; Refill: 1  4. Dyslipidemia   5. Stage 3b chronic kidney disease ([place])   6. Hypothyroidism, postsurgical  - TSH  7. Benign hypertension with chronic kidney disease, stage III ([place])   8. Perennial allergic rhinitis with seasonal variation   9. Hypertension, benign    10. History of renal artery stenosis

## 2021-07-01 NOTE — Progress Notes (Signed)
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 She stated that Dr. Ancil Boozer is the one that would like her to speak to CPP.   Recent office visits:  06/19/2021 Steele Sizer, MD (PCP Office Visit) for Hospital Follow-up- Started Ezetimibe 10 mg Daily, no orders placed; No follow-up noted  Recent consult visits:  06/29/2021 Dwayne Aida Raider, MD (Cardiology) for Follow-up- No medication changes noted; NM myocardial perfusion SPECT multiple test ordered; ECG Stress test only ordered; (appears the note is not all the way complete)  06/22/2021 Munsoor Lateef, MD (Nephrology) for Follow-up- No medication changes noted; no orders placed; patient instructed to follow-up in 2 weeks.  Hospital visits:  Medication Reconciliation was completed by comparing discharge summary, patient's EMR and Pharmacy list, and upon discussion with patient.  Admitted to the hospital on 06/06/2021 due to CVA. Discharge date was 06/17/2021. Discharged from [place]?Medications Started at [facility] Discharge:?? -START taking: Aspirin 81 MG EC tablet Take 1 tablet (81 mg total) by mouth daily. cloNIDine (CATAPRES - Dosed in mg/24 hr) 0.2 mg/24hr patch Place 1 patch (0.2 mg total) onto the skin every 7 days. Replaces: cloNIDine 0.2 MG tablet hydrochlorothiazide (MICROZIDE)  12.5 MG capsule Take 1 capsule (12.5 mg total) by mouth daily. patiromer Daryll Drown) g packet Start taking on: June 18, 2021 Take 1 packet (8.4 g total) by mouth daily. For: High Amount of Potassium in the Blood Start taking on: June 18, 2021  Medication Changes at Hospital Discharge: -CHANGE how you take: labetalol 200 MG tablet Take 1 tablet (200 mg total) by mouth 2 (two) times daily. What changed: medication strength how much to take  Medications Discontinued at Hospital Discharge: -STOP taking: cloNIDine 0.2 MG tablet (CATAPRES) Replaced by a similar medication. irbesartan 300 MG tablet (AVAPRO) montelukast 10 MG tablet (SINGULAIR)  Medications that remain the same after Hospital Discharge:??  -All other medications will remain the same.    Medications: Outpatient Encounter Medications as of 07/01/2021  Medication Sig   aspirin EC 81 MG EC tablet Take 1 tablet (81 mg total) by mouth daily. Swallow whole.   atorvastatin (LIPITOR) 40 MG tablet Take 1 tablet (40 mg total) by mouth daily at 6 PM. Cholesterol medication.   cetirizine (ZYRTEC) 10 MG tablet Take 1 tablet (10 mg total) by mouth daily as needed. PRN only   Cholecalciferol 25 MCG (1000 UT) tablet Take 1,000 Units by mouth daily.    cloNIDine (CATAPRES - DOSED IN MG/24 HR) 0.2 mg/24hr patch Place 1 patch (0.2 mg total) onto the skin every 7 (seven) days.   clopidogrel (PLAVIX) 75 MG tablet TAKE 1 TABLET BY MOUTH ONCE DAILY. BLOOD THINNER TO HELP REDUCE STROKE RISK   ezetimibe (ZETIA) 10 MG tablet Take 1 tablet (10 mg total) by mouth daily.   hydrochlorothiazide (MICROZIDE) 12.5 MG capsule Take 1 capsule (12.5 mg total) by mouth daily.   labetalol (NORMODYNE) 200 MG tablet Take 1 tablet (200 mg total)  by mouth 2 (two) times daily.   levothyroxine (SYNTHROID) 75 MCG tablet Take 1 tablet (75 mcg total) by mouth daily before breakfast.   nitroGLYCERIN (NITROSTAT) 0.4 MG SL tablet Place under the tongue.   Omega-3  Fatty Acids (FISH OIL) 1000 MG CPDR Take 2 capsules by mouth daily.   patiromer (VELTASSA) 8.4 g packet Take 1 packet (8.4 g total) by mouth daily.   No facility-administered encounter medications on file as of 07/01/2021.   Care Gaps: Colonoscopy Zoster Vaccines Dexa Scan COVID-19 Vaccine Booster 4  Star Rating Drugs: Atorvastatin 40 mg last filled on 04/06/2021 for a 90-Day supply with [facility]  Have you seen any other providers since your last visit? yes  Any changes in your medications or health?   Patient stated that she was started on Veltassa 8.4 mg the provider gave her samples, but she can't afford this medication as the copay is 300.00. Patient reports that she is going to soon be taken off the Clonidine Patch, and started on the pills.   Any side effects from any medications?   Patient stated that when she awakens in the morning she feels really good, she eats her breakfast, and about 30 minutes after taking her medications she feels crappy. She stated that she feels weak, and for the rest of the day she has no energy and doesn't feel like doing anything. She reports that she uses her cane in order to balance due to her weakness. Patient denies any recent falls, and denies any dizziness. Patient also stated that she has really bad allergies/sinus issues, and she has seen an ENT in the past for this issue. According to the patient the ENT Specialist told her that it was not much he could do to control her issue as he feels that the clonidine is causing her the problems. Patient stated that she informed her other providers of this, but nothing at this time has been changed with her taking the medication.   Do you have an symptoms or problems not managed by your medications?   Patient stated she really doesn't know if her medications are helping or not.   Any concerns about your health right now?   Patient would like to stop feeling so bad after taking her medications in  the morning.   Has your provider asked that you check blood pressure, blood sugar, or follow special diet at home?   Patient reports that she was told to take her blood pressure at home, but she hasn't taken it due to her battery in her machine is low and she hasn't felt like going to get a battery to replace it.   Do you get any type of exercise on a regular basis?   No patient states she never have enough energy to exercise  Can you think of a goal you would like to reach for your health?   Patient stated that she just would like to get back to normal or baseline where she is not weak and sluggish all day.   Do you have any problems getting your medications?   Patient reports that there are some medications she can't afford but she is hoping to come off of those medications all together. She also stated that she does not qualify for LIS.   Please bring medications and supplements to appointment  10/17 LVM requesting patient return my call  Lynann Bologna, [place] Pharmacist Assistant Phone: [phone]

## 2021-07-02 ENCOUNTER — Ambulatory Visit (INDEPENDENT_AMBULATORY_CARE_PROVIDER_SITE_OTHER): Payer: Medicare Other

## 2021-07-02 DIAGNOSIS — I10 Essential (primary) hypertension: Secondary | ICD-10-CM

## 2021-07-02 DIAGNOSIS — Z9181 History of falling: Secondary | ICD-10-CM

## 2021-07-02 DIAGNOSIS — E785 Hyperlipidemia, unspecified: Secondary | ICD-10-CM

## 2021-07-02 NOTE — Patient Instructions (Addendum)
Thank you for allowing the Chronic Care Management team to participate in your care.    Patient Care Plan: Fall Risk (Adult)     Problem Identified: Fall Risk      Long-Range Goal: Absence of Fall and Fall-Related Injury   Start Date: 07/02/2021  Expected End Date: 09/30/2021  Priority: Medium  Note:   Current Barriers:  Risk for Falls d/t Impaired Balance and Hx of CVA  Clinical Goal(s):  Over the next 90 days, patient will not experience falls or require hospitalization for fall related injuries.  Interventions:  Collaboration with Alba Cory, MD regarding development and update of comprehensive plan of care as evidenced by provider attestation and co-signature Inter-disciplinary care team collaboration (see longitudinal plan of care) Reviewed medications and discussed potential side effects such as dizziness, lightheadedness and frequent urination. Provided information regarding safety and fall prevention. Reports using a cane when ambulating. Reports increased episodes of fatigue and decreased activity tolerance which she attributes to iron deficiency. She will follow up with her Hematologist on 07/06/21.  Discussed ability to perform ADL's and tasks in the home. Reports completing ADL's independently. Reports good support from family and neighbors. Declined need for additional assistance. Agreed to update the care management team if this changes and additional services are required.  Self-Care Deficits/Patient Goals:  Utilize assistive device appropriately with all ambulation Ensure pathways are clear and well lit Change positions slowly and use caution when ambulating Wear secure fitting, skid free footwear when ambulating Notify provider or care management team for questions and new concerns as needed   Follow Up Plan:  Will follow up next month    Patient Care Plan: Hypertension and Dyslipidemia     Problem Identified: Hypertension and Dyslipidemia       Long-Range Goal: Disease Progression Prevented or Minimized   Start Date: 07/02/2021  Expected End Date: 09/30/2021  Priority: High  Note:   Objective:  Last practice recorded BP readings:  BP Readings from Last 3 Encounters:  07/01/21 134/86  06/19/21 132/84  06/17/21 (!) 178/83   Most recent eGFR/CrCl: No results found for: EGFR  No components found for: CRCL  Lab Results  Component Value Date   CHOL 217 (H) 07/01/2021   HDL 75 07/01/2021   LDLCALC 124 (H) 07/01/2021   TRIG 83 07/01/2021   CHOLHDL 2.9 07/01/2021     Current Barriers:  Chronic Disease Management support and educational needs related to Hypertension and Dyslipidemia.  Case Manager Clinical Goal(s):  Over the next 90 days, patient will demonstrate improved adherence to prescribed treatment plan as evidenced by taking all medications as prescribed, monitoring and recording blood pressure and adhering to a low sodium/DASH diet.  Interventions:  Collaboration with Alba Cory, MD regarding development and update of comprehensive plan of care as evidenced by provider attestation and co-signature Inter-disciplinary care team collaboration (see longitudinal plan of care) Reviewed medications.  Discussed importance of compliance. Advised to continue taking medications as prescribed and notify provider if unable to tolerate regimen. Voiced concerns regarding prescription cost. She will discuss with the CCM Pharmacist on 07/08/21. Provided information regarding established blood pressure parameters along with indications for notifying a provider. Reports not monitoring consistently. Encouraged to monitor and record readings.  Discussed compliance with recommended cardiac prudent diet. Encouraged to read nutrition labels, monitor sodium intake and avoid highly processed foods when possible. Discussed complications of uncontrolled blood pressure. Reviewed s/sx of heart attack, stroke and worsening symptoms that require  immediate medical attention.  Patient Goals/Self-Care Activities: Self-administer medications as prescribed Attend medical appointments as scheduled Complete outreach with the CCM Pharmacist Monitor and record blood pressure Adhere to recommended cardiac prudent/heart healthy diet Notify provider or care management team with questions and new concerns as needed    Follow Up Plan:  Will follow up next month       Ms. Mccomb verbalized understanding of the information discussed during the telephonic outreach. Declined need for mailed/printed instructions. A member of the care management team will follow up next month.   [facility] Care Management [facility] [phone]

## 2021-07-02 NOTE — Chronic Care Management (AMB) (Signed)
Chronic Care Management   CCM RN Visit Note  07/02/2021 Name: Betty Nielsen MRN: [id number] DOB: [birth year]  Subjective: Betty Nielsen is a 75 y.o. year old female who is a primary care patient of Steele Sizer, MD. The care management team was consulted for assistance with disease management and care coordination needs.    Engaged with patient by telephone for initial visit in response to provider referral for case management and care coordination services.   Consent to Services:  The patient was given the following information about Chronic Care Management services: 1. CCM service includes personalized support from designated clinical staff supervised by the primary care provider, including individualized plan of care and coordination with other care providers 2. 24/7 contact phone numbers for assistance for urgent and routine care needs. 3. Service will only be billed when office clinical staff spend 20 minutes or more in a month to coordinate care. 4. Only one practitioner may furnish and bill the service in a calendar month. 5.The patient may stop CCM services at any time (effective at the end of the month) by phone call to the office staff. 6. The patient will be responsible for cost sharing (co-pay) of up to 20% of the service fee (after annual deductible is met). Patient agreed to services and consent obtained.   Assessment: Review of patient past medical history, allergies, medications, health status, including review of consultants reports, laboratory and other test data, was performed as part of comprehensive evaluation and provision of chronic care management services.   SDOH (Social Determinants of Health) assessments and interventions performed:  SDOH Interventions    Flowsheet Row Most Recent Value  SDOH Interventions   Food Insecurity Interventions Intervention Not Indicated  Transportation Interventions Intervention Not Indicated        CCM Care Plan  Allergies   Allergen Reactions   Ace Inhibitors Cough   Ciprofloxacin Other (See Comments)    unknown   Citalopram     hyponatremia   Hydrocodone-Acetaminophen Other (See Comments)    unknown   Norvasc [Amlodipine Besylate]     Pruritus    Pantoprazole Sodium Other (See Comments)    Cramps   Pravastatin Other (See Comments)    Stomach cramps   Sulfa Antibiotics     unknown   Clonidine Other (See Comments)    Per patient told by allergist that would be best to stop but nephrologist recommended her to stay on medication     Outpatient Encounter Medications as of 07/02/2021  Medication Sig   aspirin EC 81 MG EC tablet Take 1 tablet (81 mg total) by mouth daily. Swallow whole.   atorvastatin (LIPITOR) 40 MG tablet Take 1 tablet (40 mg total) by mouth daily at 6 PM. Cholesterol medication.   cetirizine (ZYRTEC) 10 MG tablet Take 1 tablet (10 mg total) by mouth daily as needed. PRN only   Cholecalciferol 25 MCG (1000 UT) tablet Take 1,000 Units by mouth daily.    cloNIDine (CATAPRES - DOSED IN MG/24 HR) 0.2 mg/24hr patch Place 1 patch (0.2 mg total) onto the skin every 7 (seven) days.   clopidogrel (PLAVIX) 75 MG tablet TAKE 1 TABLET BY MOUTH ONCE DAILY. BLOOD THINNER TO HELP REDUCE STROKE RISK   hydrochlorothiazide (MICROZIDE) 12.5 MG capsule Take 1 capsule (12.5 mg total) by mouth daily.   labetalol (NORMODYNE) 200 MG tablet Take 1 tablet (200 mg total) by mouth 2 (two) times daily.   levothyroxine (SYNTHROID) 75 MCG tablet Take 1 tablet (75  mcg total) by mouth daily before breakfast.   Omega-3 Fatty Acids (FISH OIL) 1000 MG CPDR Take 2 capsules by mouth daily.   patiromer (VELTASSA) 8.4 g packet Take 1 packet (8.4 g total) by mouth daily.   ezetimibe (ZETIA) 10 MG tablet Take 1 tablet (10 mg total) by mouth daily.   NIFEdipine (PROCARDIA XL/NIFEDICAL XL) 60 MG 24 hr tablet Take 60 mg by mouth daily.   nitroGLYCERIN (NITROSTAT) 0.4 MG SL tablet Place under the tongue.   No  facility-administered encounter medications on file as of 07/02/2021.    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   History of stroke 10/27/2019   PMR (polymyalgia rheumatica) ([place]) 06/15/2017   Headache 10/27/2016   CKD (chronic kidney disease), stage III ([place]) 10/27/2016   Hyperlipidemia 10/27/2016   Hyponatremia 10/27/2016   Angina pectoris ([place]) 10/25/2016   History of non-ST elevation myocardial infarction (NSTEMI) 10/25/2016   History of coronary angioplasty 10/25/2016   BPV (benign positional vertigo) 06/15/2016   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[place]) 11/21/2015   Acid reflux 06/05/2015   Benign secondary hypertension due to renal artery stenosis ([place]) 06/05/2015   Hypothyroidism, 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   Major depression in remission ([place]) 06/05/2015   Obesity (BMI 30.0-34.9) 06/05/2015   Neuropathy 06/05/2015   Perennial allergic rhinitis with seasonal variation 06/05/2015   Arthritis, degenerative 07/03/2014   AAA (abdominal aortic aneurysm) without rupture 06/26/2014    Conditions to be addressed/monitored:HTN, Dyslipidemia and Fall Risk Patient Care Plan: Fall Risk (Adult)     Problem Identified: Fall Risk      Long-Range Goal: Absence of Fall and Fall-Related Injury   Start Date: 07/02/2021  Expected End Date: 09/30/2021  Priority: Medium  Note:   Current Barriers:  Risk for Falls  d/t Impaired Balance and Hx of CVA  Clinical Goal(s):  Over the next 90 days, patient will not experience falls or require hospitalization for fall related injuries.  Interventions:  Collaboration with Steele Sizer, MD regarding development and update of comprehensive plan of care as evidenced by provider attestation and co-signature Inter-disciplinary care team collaboration (see longitudinal plan of care) Reviewed medications and discussed potential side effects such as dizziness, lightheadedness and frequent urination. Provided information regarding safety and fall prevention. Reports using a cane when ambulating. Reports increased episodes of fatigue and decreased activity tolerance which she attributes to iron deficiency. She will follow up with her Hematologist on 07/06/21.  Discussed ability to perform ADL's and tasks in the home. Reports completing ADL's independently. Reports good support from family and neighbors. Declined need for additional assistance. Agreed to update the care management team if this changes and additional services are required.  Self-Care Deficits/Patient Goals:  Utilize assistive device appropriately with all ambulation Ensure pathways are clear and well lit Change positions slowly and use caution when ambulating Wear secure fitting, skid free footwear when ambulating Notify provider or care management team for questions and new concerns as  needed   Follow Up Plan:  Will follow up next month    Patient Care Plan: Hypertension and Dyslipidemia     Problem Identified: Hypertension and Dyslipidemia      Long-Range Goal: Disease Progression Prevented or Minimized   Start Date: 07/02/2021  Expected End Date: 09/30/2021  Priority: High  Note:   Objective:  Last practice recorded BP readings:  BP Readings from Last 3 Encounters:  07/01/21 134/86  06/19/21 132/84  06/17/21 (!) 178/83   Most recent eGFR/CrCl: No results found for: EGFR  No components  found for: CRCL  Lab Results  Component Value Date   CHOL 217 (H) 07/01/2021   HDL 75 07/01/2021   LDLCALC 124 (H) 07/01/2021   TRIG 83 07/01/2021   CHOLHDL 2.9 07/01/2021     Current Barriers:  Chronic Disease Management support and educational needs related to Hypertension and Dyslipidemia.  Case Manager Clinical Goal(s):  Over the next 90 days, patient will demonstrate improved adherence to prescribed treatment plan as evidenced by taking all medications as prescribed, monitoring and recording blood pressure and adhering to a low sodium/DASH diet.  Interventions:  Collaboration with Steele Sizer, MD regarding development and update of comprehensive plan of care as evidenced by provider attestation and co-signature Inter-disciplinary care team collaboration (see longitudinal plan of care) Reviewed medications.  Discussed importance of compliance. Advised to continue taking medications as prescribed and notify provider if unable to tolerate regimen. Voiced concerns regarding prescription cost. She will discuss with the CCM Pharmacist on 07/08/21. Provided information regarding established blood pressure parameters along with indications for notifying a provider. Reports not monitoring consistently. Encouraged to monitor and record readings.  Discussed compliance with recommended cardiac prudent diet. Encouraged to read nutrition labels, monitor sodium intake and avoid highly processed foods when possible. Discussed complications of uncontrolled blood pressure. Reviewed s/sx of heart attack, stroke and worsening symptoms that require immediate medical attention.   Patient Goals/Self-Care Activities: Self-administer medications as prescribed Attend medical appointments as scheduled Complete outreach with the CCM Pharmacist Monitor and record blood pressure Adhere to recommended cardiac prudent/heart healthy diet Notify provider or care management team with questions and new concerns  as needed    Follow Up Plan:  Will follow up next month      PLAN A member of the care management team will follow up next month.   [facility] Care Management [facility] [phone]

## 2021-07-06 ENCOUNTER — Inpatient Hospital Stay: Payer: Medicare Other | Attending: Oncology | Admitting: Oncology

## 2021-07-06 ENCOUNTER — Other Ambulatory Visit: Payer: Self-pay

## 2021-07-06 ENCOUNTER — Inpatient Hospital Stay: Payer: Medicare Other

## 2021-07-06 ENCOUNTER — Encounter: Payer: Self-pay | Admitting: Oncology

## 2021-07-06 VITALS — BP 172/89 | HR 97 | Temp 98.0°F | Wt 175.1 lb

## 2021-07-06 DIAGNOSIS — N184 Chronic kidney disease, stage 4 (severe): Secondary | ICD-10-CM

## 2021-07-06 DIAGNOSIS — D631 Anemia in chronic kidney disease: Secondary | ICD-10-CM

## 2021-07-06 DIAGNOSIS — Z87891 Personal history of nicotine dependence: Secondary | ICD-10-CM | POA: Diagnosis not present

## 2021-07-06 LAB — VITAMIN B12: Vitamin B-12: 634 pg/mL (ref 180–914)

## 2021-07-06 LAB — RETIC PANEL
Immature Retic Fract: 10.8 % (ref 2.3–15.9)
RBC.: 2.82 MIL/uL — ABNORMAL LOW (ref 3.87–5.11)
Retic Count, Absolute: 54.4 10*3/uL (ref 19.0–186.0)
Retic Ct Pct: 1.9 % (ref 0.4–3.1)
Reticulocyte Hemoglobin: 34 pg (ref >27.9)

## 2021-07-06 LAB — CBC WITH DIFFERENTIAL/PLATELET
Abs Immature Granulocytes: 0.03 10*3/uL (ref 0.00–0.07)
Basophils Absolute: 0 10*3/uL (ref 0.0–0.1)
Basophils Relative: 1 %
Eosinophils Absolute: 0.4 10*3/uL (ref 0.0–0.5)
Eosinophils Relative: 8 %
HCT: 26.1 % — ABNORMAL LOW (ref 36.0–46.0)
Hemoglobin: 8.5 g/dL — ABNORMAL LOW (ref 12.0–15.0)
Immature Granulocytes: 1 %
Lymphocytes Relative: 24 %
Lymphs Abs: 1.2 10*3/uL (ref 0.7–4.0)
MCH: 30.4 pg (ref 26.0–34.0)
MCHC: 32.6 g/dL (ref 30.0–36.0)
MCV: 93.2 fL (ref 80.0–100.0)
Monocytes Absolute: 0.5 10*3/uL (ref 0.1–1.0)
Monocytes Relative: 9 %
Neutro Abs: 3.1 10*3/uL (ref 1.7–7.7)
Neutrophils Relative %: 57 %
Platelets: 213 10*3/uL (ref 150–400)
RBC: 2.8 MIL/uL — ABNORMAL LOW (ref 3.87–5.11)
RDW: 12.6 % (ref 11.5–15.5)
WBC: 5.2 10*3/uL (ref 4.0–10.5)
nRBC: 0 % (ref 0.0–0.2)

## 2021-07-06 LAB — COMPREHENSIVE METABOLIC PANEL
ALT: 12 U/L (ref 0–44)
AST: 21 U/L (ref 15–41)
Albumin: 3.7 g/dL (ref 3.5–5.0)
Alkaline Phosphatase: 64 U/L (ref 38–126)
Anion gap: 9 (ref 5–15)
BUN: 36 mg/dL — ABNORMAL HIGH (ref 8–23)
CO2: 22 mmol/L (ref 22–32)
Calcium: 9.1 mg/dL (ref 8.9–10.3)
Chloride: 100 mmol/L (ref 98–111)
Creatinine, Ser: 3.13 mg/dL — ABNORMAL HIGH (ref 0.44–1.00)
GFR, Estimated: 15 mL/min — ABNORMAL LOW (ref >60)
Glucose, Bld: 103 mg/dL — ABNORMAL HIGH (ref 70–99)
Potassium: 3.9 mmol/L (ref 3.5–5.1)
Sodium: 131 mmol/L — ABNORMAL LOW (ref 135–145)
Total Bilirubin: 0.5 mg/dL (ref 0.3–1.2)
Total Protein: 6.5 g/dL (ref 6.5–8.1)

## 2021-07-06 LAB — IRON AND TIBC
Iron: 62 ug/dL (ref 28–170)
Saturation Ratios: 19 % (ref 10.4–31.8)
TIBC: 319 ug/dL (ref 250–450)
UIBC: 257 ug/dL

## 2021-07-06 LAB — FOLATE: Folate: 25 ng/mL (ref >5.9)

## 2021-07-06 LAB — LACTATE DEHYDROGENASE: LDH: 190 U/L (ref 98–192)

## 2021-07-06 LAB — FERRITIN: Ferritin: 112 ng/mL (ref 11–307)

## 2021-07-06 NOTE — Addendum Note (Signed)
Addended by: Earlie Server on: 07/06/2021 07:40 PM   Modules accepted: Orders

## 2021-07-06 NOTE — Progress Notes (Signed)
Hematology/Oncology Consult note [facility] [phone] [phone]   Patient Care Team: Steele Sizer, MD as PCP - General (Family Medicine) Lavonia Dana, MD as Consulting Physician (Internal Medicine) Yolonda Kida, MD as Consulting Physician (Cardiology) Emmaline Kluver., MD as Consulting Physician (Rheumatology) Angelia Mould, MD as Consulting Physician (Vascular Surgery) Carloyn Manner, MD as Referring Physician (Otolaryngology) Neldon Labella, RN as Case Manager Germaine Pomfret, [facility] (Pharmacist)  REFERRING PROVIDER: Anthonette Legato, MD  CHIEF COMPLAINTS/REASON FOR VISIT:  Evaluation of anemia  HISTORY OF PRESENTING ILLNESS:   Betty Nielsen is a  75 y.o.  female with PMH listed below was seen in consultation at the request of  Anthonette Legato, MD  for evaluation of anemia.   Patient was accompanied by her daughter in law.  She has history of CKD follows up with nephrology.  + fatigue and tired. Denies any black stool or bloody stool, abdominal pain. 06/17/2021 CBC showed hemoglobin of 7.6, normal wbc and platelet.  Creatine 4.14, GFR 11.  Patient tells me that she does not want to go on hemodialysis.    Review of Systems  Constitutional:  Positive for fatigue. Negative for appetite change, chills and fever.  HENT:   Negative for hearing loss and voice change.   Eyes:  Negative for eye problems.  Respiratory:  Positive for shortness of breath. Negative for chest tightness and cough.   Cardiovascular:  Negative for chest pain.  Gastrointestinal:  Negative for abdominal distention, abdominal pain and blood in stool.  Endocrine: Negative for hot flashes.  Genitourinary:  Negative for difficulty urinating and frequency.   Musculoskeletal:  Negative for arthralgias.  Skin:  Negative for itching and rash.  Neurological:  Negative for extremity weakness.  Hematological:  Negative for adenopathy.   Psychiatric/Behavioral:  Negative for confusion.    MEDICAL HISTORY:  Past Medical History:  Diagnosis Date   Arthrosis of knee    Chronic kidney disease    Hyperlipidemia    Hypertension    Loss of hearing    Metabolic syndrome    Plantar fasciitis    Renal insufficiency     SURGICAL HISTORY: Past Surgical History:  Procedure Laterality Date   ABDOMINAL AORTIC ANEURYSM REPAIR     ABDOMINAL HYSTERECTOMY     APPENDECTOMY     CORONARY STENT INTERVENTION N/A 10/26/2016   Procedure: Coronary Stent Intervention;  Surgeon: Yolonda Kida, MD;  Location: [place] [facility];  Service: Cardiovascular;  Laterality: N/A;   HERNIA REPAIR     INNER EAR SURGERY     pt not sure of type   LEFT HEART CATH AND CORONARY ANGIOGRAPHY N/A 10/26/2016   Procedure: Left Heart Cath and Coronary Angiography;  Surgeon: Teodoro Spray, MD;  Location: [place] [facility];  Service: Cardiovascular;  Laterality: N/A;   RENAL ANGIOGRAPHY N/A 06/11/2021   Procedure: RENAL ANGIOGRAPHY;  Surgeon: Algernon Huxley, MD;  Location: [place] [facility];  Service: Cardiovascular;  Laterality: N/A;   THYROIDECTOMY      SOCIAL HISTORY: Social History   Socioeconomic History   Marital status: Widowed    Spouse name: Not on file   Number of children: 1   Years of education: GED   Highest education level: 10th grade  Occupational History   Occupation: Retired  Tobacco Use   Smoking status: Former    Packs/day: 0.50    Years: 20.00    Pack years: 10.00    Types: Cigarettes  Quit date: 2000    Years since quitting: 22.8   Smokeless tobacco: Never   Tobacco comments:    smoking cessation materials not required  Vaping Use   Vaping Use: Never used  Substance and Sexual Activity   Alcohol use: No    Alcohol/week: 0.0 standard drinks   Drug use: No   Sexual activity: Not Currently  Other Topics Concern   Not on file  Social History Narrative   Lives alone   Retired from working a [place] Strain: Not on file  Food Insecurity: No [place] in the Last Year: Never true   Arboriculturist in the Last Year: Never true  Transportation Needs: No Transportation Needs   Lack of Transportation (Medical): No   Lack of Transportation (Non-Medical): No  Physical Activity: Not on file  Stress: Not on file  Social Connections: Not on file  Intimate Partner Violence: Not on file    FAMILY HISTORY: Family History  Problem Relation Age of Onset   Healthy Mother    Cerebral aneurysm Father     ALLERGIES:  is allergic to ace inhibitors, ciprofloxacin, citalopram, hydrocodone-acetaminophen, norvasc [amlodipine besylate], pantoprazole sodium, pravastatin, sulfa antibiotics, and clonidine.  MEDICATIONS:  Current Outpatient Medications  Medication Sig Dispense Refill   aspirin EC 81 MG EC tablet Take 1 tablet (81 mg total) by mouth daily. Swallow whole. 30 tablet 0   atorvastatin (LIPITOR) 40 MG tablet Take 1 tablet (40 mg total) by mouth daily at 6 PM. Cholesterol medication. 90 tablet 1   cetirizine (ZYRTEC) 10 MG tablet Take 1 tablet (10 mg total) by mouth daily as needed. PRN only     Cholecalciferol 25 MCG (1000 UT) tablet Take 1,000 Units by mouth daily.      cloNIDine (CATAPRES - DOSED IN MG/24 HR) 0.2 mg/24hr patch Place 1 patch (0.2 mg total) onto the skin every 7 (seven) days. 4 patch 0   clopidogrel (PLAVIX) 75 MG tablet TAKE 1 TABLET BY MOUTH ONCE DAILY. BLOOD THINNER TO HELP REDUCE STROKE RISK 90 tablet 1   ezetimibe (ZETIA) 10 MG tablet Take 1 tablet (10 mg total) by mouth daily. 90 tablet 1   hydrochlorothiazide (MICROZIDE) 12.5 MG capsule Take 1 capsule (12.5 mg total) by mouth daily. 30 capsule 0   labetalol (NORMODYNE) 200 MG tablet Take 1 tablet (200 mg total) by mouth 2 (two) times daily. 60 tablet 0   levothyroxine (SYNTHROID) 75 MCG tablet Take 1 tablet (75 mcg total) by mouth daily  before breakfast. 90 tablet 1   NIFEdipine (PROCARDIA XL/NIFEDICAL XL) 60 MG 24 hr tablet Take 60 mg by mouth daily.     nitroGLYCERIN (NITROSTAT) 0.4 MG SL tablet Place under the tongue.     Omega-3 Fatty Acids (FISH OIL) 1000 MG CPDR Take 2 capsules by mouth daily.     patiromer (VELTASSA) 8.4 g packet Take 1 packet (8.4 g total) by mouth daily. 30 each 0   No current facility-administered medications for this visit.     PHYSICAL EXAMINATION: ECOG PERFORMANCE STATUS: 1 - Symptomatic but completely ambulatory Vitals:   07/06/21 1117  BP: (!) 172/89  Pulse: 97  Temp: 98 F (36.7 C)  SpO2: 98%   Filed Weights   07/06/21 1117  Weight: 175 lb 1.6 oz (79.4 kg)    Physical Exam Constitutional:      General:  She is not in acute distress. HENT:     Head: Normocephalic and atraumatic.  Eyes:     General: No scleral icterus. Cardiovascular:     Rate and Rhythm: Normal rate and regular rhythm.     Heart sounds: Normal heart sounds.  Pulmonary:     Effort: Pulmonary effort is normal. No respiratory distress.     Breath sounds: No wheezing.  Abdominal:     General: Bowel sounds are normal. There is no distension.     Palpations: Abdomen is soft.  Musculoskeletal:        General: No deformity. Normal range of motion.     Cervical back: Normal range of motion and neck supple.  Skin:    General: Skin is warm and dry.     Coloration: Skin is pale.     Findings: No erythema or rash.  Neurological:     Mental Status: She is alert and oriented to person, place, and time. Mental status is at baseline.     Cranial Nerves: No cranial nerve deficit.     Coordination: Coordination normal.  Psychiatric:        Mood and Affect: Mood normal.    LABORATORY DATA:  I have reviewed the data as listed Lab Results  Component Value Date   WBC 5.2 07/06/2021   HGB 8.5 (L) 07/06/2021   HCT 26.1 (L) 07/06/2021   MCV 93.2 07/06/2021   PLT 213 07/06/2021   Recent Labs    06/06/21 2110  06/07/21 0740 06/08/21 0707 06/09/21 0505 06/10/21 0510 06/11/21 0548 06/16/21 0442 06/17/21 0514 07/06/21 1158  NA 131* 132*   < > 133* 132*   < > 130* 129* 131*  K 4.4 4.7   < > 4.2 4.6   < > 4.8 5.1 3.9  CL 98 99   < > 99 99   < > 103 106 100  CO2 25 23   < > 24 26   < > 18* 19* 22  GLUCOSE 119* 109*   < > 92 96   < > 85 87 103*  BUN 26* 24*   < > 30* 30*   < > 44* 37* 36*  CREATININE 1.98* 1.87*   < > 2.55* 2.31*   < > 4.95* 4.14* 3.13*  CALCIUM 9.0 9.6   < > 9.2 8.9   < > 8.3* 8.4* 9.1  GFRNONAA 26* 28*   < > 19* 22*   < > 9* 11* 15*  PROT 6.6 6.9  --   --   --   --   --   --  6.5  ALBUMIN 3.7 3.8   < > 3.4* 3.2*  --   --   --  3.7  AST 20 23  --   --   --   --   --   --  21  ALT 12 12  --   --   --   --   --   --  12  ALKPHOS 89 84  --   --   --   --   --   --  64  BILITOT 0.6 0.7  --   --   --   --   --   --  0.5   < > = values in this interval not displayed.   Iron/TIBC/Ferritin/ %Sat    Component Value Date/Time   IRON 62 07/06/2021 1158   TIBC 319 07/06/2021 1158   FERRITIN 112  07/06/2021 1158   IRONPCTSAT 19 07/06/2021 1158      RADIOGRAPHIC STUDIES: I have personally reviewed the radiological images as listed and agreed with the findings in the report. CT ABDOMEN PELVIS WO CONTRAST  Result Date: 06/11/2021 CLINICAL DATA:  75 year old female with a history of possible retroperitoneal hematoma EXAM: CT ABDOMEN AND PELVIS WITHOUT CONTRAST TECHNIQUE: Multidetector CT imaging of the abdomen and pelvis was performed following the standard protocol without IV contrast. COMPARISON:  CT 08/21/2018, 11/19/2014 FINDINGS: Lower chest: No acute finding of the lower chest. Similar appearance of asymmetric elevation of the left hemidiaphragm. Hepatobiliary: Unremarkable liver.  Unremarkable gallbladder. Pancreas: Unremarkable Spleen: Unremarkable Adrenals/Urinary Tract: Bilateral adrenal glands unremarkable. Right kidney demonstrates persisting nephrogram, compatible with recent  angiogram. No hydronephrosis. Early contrast excretion into the collecting system. Multiple low-density lesions of varying sizes, most compatible with cysts and similar to the comparison CT. Redemonstration of significant cortical thinning of the left kidney with relatively asymmetric appearance of the persisting nephrogram. Contrast present within the left collecting system compatible with early excretion. Multiple low-density cystic lesions within the left kidney, most likely simple cysts. Stomach/Bowel: Unremarkable appearance of the stomach. Unremarkable appearance of small bowel. No abnormal distension. No focal wall thickening. Large stool burden with formed stool throughout the length of the majority of the colon. No focal inflammatory changes. Diverticular disease of the left colon. Vascular/Lymphatic: Redemonstration atherosclerotic changes of the lower thoracic and of the abdominal aorta. 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 Atherosclerotic changes of the bilateral iliac system. Interval right renal artery stent placement. Changes of recent right common femoral artery access with no local hematoma. No evidence of retroperitoneal hemorrhage or perivascular hemorrhage of the right iliac system, aorta, or right renal artery. No lymphadenopathy. Reproductive: Crescent shaped low-density structure posterior to the pubic symphysis, intimately associated with the anterior urethra, most likely periurethral cyst. Other: Injection granulomas of the lower left abdominal wall. Musculoskeletal: No acute displaced fracture. Multilevel degenerative changes of the spine. Degenerative changes of the hips. IMPRESSION: CT is negative for evidence of retroperitoneal hemorrhage. Interval right renal artery stenting. Aortic atherosclerosis with surgical changes of prior open repair infrarenal abdominal aortic aneurysm. Aortic Atherosclerosis (ICD10-I70.0). Additional ancillary findings  as above. Electronically Signed   By: Corrie Mckusick D.O.   On: 06/11/2021 12:53   CT HEAD WO CONTRAST (5MM)  Result Date: 06/09/2021 CLINICAL DATA:  Mental status change, unknown cause EXAM: CT HEAD WITHOUT CONTRAST TECHNIQUE: Contiguous axial images were obtained from the base of the skull through the vertex without intravenous contrast. COMPARISON:  06/06/2021 FINDINGS: Brain: No evidence of acute infarction, hemorrhage, hydrocephalus, extra-axial collection or mass lesion/mass effect. Scattered low-density changes within the periventricular and subcortical white matter compatible with chronic microvascular ischemic change. Mild diffuse cerebral volume loss. Vascular: Atherosclerotic calcifications involving the large vessels of the skull base. No unexpected hyperdense vessel. Skull: Normal. Negative for fracture or focal lesion. Sinuses/Orbits: No acute finding. Other: None. IMPRESSION: No acute intracranial findings.  Stable exam. Electronically Signed   By: Davina Poke D.O.   On: 06/09/2021 11:37   CT HEAD WO CONTRAST  Result Date: 06/06/2021 CLINICAL DATA:  Transient ischemic attack EXAM: CT HEAD WITHOUT CONTRAST TECHNIQUE: Contiguous axial images were obtained from the base of the skull through the vertex without intravenous contrast. COMPARISON:  None. FINDINGS: Brain: There is no mass, hemorrhage or extra-axial collection. The size and configuration of the ventricles and extra-axial CSF spaces are normal. The brain parenchyma  is normal, without acute or chronic infarction. Vascular: No abnormal hyperdensity of the major intracranial arteries or dural venous sinuses. No intracranial atherosclerosis. Skull: The visualized skull base, calvarium and extracranial soft tissues are normal. Sinuses/Orbits: No fluid levels or advanced mucosal thickening of the visualized paranasal sinuses. No mastoid or middle ear effusion. The orbits are normal. IMPRESSION: Normal head CT. Electronically Signed   By:  Ulyses Jarred M.D.   On: 06/06/2021 21:47   MR ANGIO HEAD WO CONTRAST  Result Date: 06/07/2021 CLINICAL DATA:  Left arm weakness and facial tingling EXAM: MRA HEAD WITHOUT CONTRAST TECHNIQUE: Angiographic images of the Circle of Willis were acquired using MRA technique without intravenous contrast. COMPARISON:  No pertinent prior exam. FINDINGS: POSTERIOR CIRCULATION: --Vertebral arteries: Normal --Inferior cerebellar arteries: Normal. --Basilar artery: Normal. --Superior cerebellar arteries: Normal. --Posterior cerebral arteries: Normal. ANTERIOR CIRCULATION: --Intracranial internal carotid arteries: Normal. --Anterior cerebral arteries (ACA): Normal. --Middle cerebral arteries (MCA): Normal. ANATOMIC VARIANTS: Fetal origin of the right PCA. IMPRESSION: Normal intracranial MRA. Electronically Signed   By: Ulyses Jarred M.D.   On: 06/07/2021 01:31   MR BRAIN WO CONTRAST  Result Date: 06/07/2021 CLINICAL DATA:  Acute neurologic deficit EXAM: MRI HEAD WITHOUT CONTRAST TECHNIQUE: Multiplanar, multiecho pulse sequences of the brain and surrounding structures were obtained without intravenous contrast. COMPARISON:  None. FINDINGS: Brain: No acute infarct, mass effect or extra-axial collection. No acute or chronic hemorrhage. There is multifocal hyperintense T2-weighted signal within the white matter. Parenchymal volume and CSF spaces are normal. A partially empty sella is incidentally noted. Vascular: Major flow voids are preserved. Skull and upper cervical spine: Normal calvarium and skull base. Visualized upper cervical spine and soft tissues are normal. Sinuses/Orbits:No paranasal sinus fluid levels or advanced mucosal thickening. No mastoid or middle ear effusion. Normal orbits. IMPRESSION: 1. No acute intracranial abnormality. 2. Findings of chronic small vessel ischemia. Electronically Signed   By: Ulyses Jarred M.D.   On: 06/07/2021 01:35   US Carotid Bilateral  Result Date: 06/07/2021 CLINICAL DATA:   Acute neurologic deficit. EXAM: BILATERAL CAROTID DUPLEX ULTRASOUND TECHNIQUE: Pearline Cables scale imaging, color Doppler and duplex ultrasound were performed of bilateral carotid and vertebral arteries in the neck. COMPARISON:  10/26/2019 FINDINGS: Criteria: Quantification of carotid stenosis is based on velocity parameters that correlate the residual internal carotid diameter with NASCET-based stenosis levels, using the diameter of the distal internal carotid lumen as the denominator for stenosis measurement. The following velocity measurements were obtained: RIGHT ICA: 122/41 cm/sec CCA: 62/83 cm/sec SYSTOLIC ICA/CCA RATIO: 1.7 ECA: 67 cm/sec LEFT ICA: 109/29 cm/sec CCA: 66/29 cm/sec SYSTOLIC ICA/CCA RATIO: 1.9 ECA: 71 cm/sec RIGHT CAROTID ARTERY: Mild atheromatous plaque at the bifurcation. RIGHT VERTEBRAL ARTERY: Patent with antegrade flow and normal waveform LEFT CAROTID ARTERY:  Mild atheromatous plaque at the bifurcation LEFT VERTEBRAL ARTERY: Patent with antegrade flow and normal waveform IMPRESSION: 1. Mild calcified plaque at the carotid bifurcations. No evidence of hemodynamically significant stenosis. 2. Antegrade flow in the vertebral arteries. Electronically Signed   By: Jorje Guild M.D.   On: 06/07/2021 06:44   PERIPHERAL VASCULAR CATHETERIZATION  Result Date: 06/11/2021 See surgical note for result.  DG Chest Port 1 View  Result Date: 06/09/2021 CLINICAL DATA:  Syncope EXAM: PORTABLE CHEST 1 VIEW COMPARISON:  06/06/2021 FINDINGS: Chronic cardiomegaly. Chronic aortic atherosclerosis with tortuosity. Chronic elevation of the left hemidiaphragm. No pulmonary edema. No infiltrate or active collapse. No effusion. IMPRESSION: Chronic cardiomegaly and aortic atherosclerotic disease. Chronic elevation of the left hemidiaphragm. No  change since the previous study. Electronically Signed   By: Nelson Chimes M.D.   On: 06/09/2021 11:08   DG Chest Portable 1 View  Result Date: 06/06/2021 CLINICAL DATA:   Shortness of breath. Evaluate for edema. Dizziness for 3 weeks. Left arm tingling and numbness started a. EXAM: PORTABLE CHEST 1 VIEW COMPARISON:  10/25/2019 FINDINGS: Cardiac enlargement. No vascular congestion, edema, or consolidation. Lungs are clear. Segmental elevation of the left hemidiaphragm similar to prior study. No pleural effusions. No pneumothorax. Calcified and tortuous aorta. IMPRESSION: Cardiac enlargement.  No evidence of active pulmonary disease. Electronically Signed   By: Lucienne Capers M.D.   On: 06/06/2021 22:07   US RENAL ARTERY DUPLEX COMPLETE  Result Date: 06/10/2021 CLINICAL DATA:  Hypertension EXAM: RENAL/URINARY TRACT ULTRASOUND RENAL DUPLEX DOPPLER ULTRASOUND COMPARISON:  CTA December 2019 FINDINGS: Right Kidney: Length: 12 cm. Echogenicity within normal limits. No hydronephrosis. A couple of renal cysts are again seen with the largest measuring up to 5 cm. Left Kidney: Length: 13.2 cm. Echogenicity within normal limits. No hydronephrosis. Numerous cysts are again seen, with the largest measuring up to 6.5 cm. Bladder:  Within normal limits. 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 IMPRESSION: 1. By Doppler velocity criteria alone, there are no findings on this exam to suggest significant renal artery stenosis. 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 Findings on ultrasound therefore are felt to be under representative. Further evaluation could be done with either repeat CTA, or referral to a service such as interventional radiology for consideration of catheter directed renal angiography and potential treatment. 2. 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 Recommend follow-up every 12 months and vascular consultation. This recommendation follows ACR consensus guidelines: White Paper of the ACR Incidental Findings Committee II on Vascular Findings. J Am Coll Radiol 2013; 10:789-794. 3. Bilateral simple renal cysts, grossly stable. Electronically Signed   By: Albin Felling M.D.   On: 06/10/2021 10:48   ECHOCARDIOGRAM COMPLETE BUBBLE STUDY  Result Date: 06/07/2021    ECHOCARDIOGRAM REPORT   Patient Name:   Betty Nielsen Date of Exam: 06/07/2021 Medical Rec #:  [id number]       Height:       65.0 in Accession #:    [id number]      Weight:       187.0 lb Date of Birth:  04-12-1946        BSA:          1.922 m Patient Age:    65 years        BP:           212/185 mmHg Patient Gender: F  HR:           62 bpm. Exam Location:  [facility] Procedure: 2D Echo, Color Doppler and Cardiac Doppler Indications:     TIA (transient ischemic attack) 435.9 / G45.9  History:         Patient has prior history of Echocardiogram examinations. Risk                  Factors:Hypertension. HLD.  Sonographer:     Alyse Low Roar Referring Phys:  [id number] Rhetta Mura Diagnosing Phys: Ida Rogue MD IMPRESSIONS  1. Left ventricular ejection fraction, by estimation, is 60 to 65%. The left ventricle has normal function. The left ventricle has no regional wall motion abnormalities. There is mild left ventricular hypertrophy. Left ventricular diastolic parameters are consistent with Grade I diastolic dysfunction (impaired  relaxation). The average left ventricular global longitudinal strain is -20.3 %. The global longitudinal strain is normal.  2. Right ventricular systolic function is normal. The right ventricular size is normal.  3. The mitral valve is normal in structure. No evidence of mitral valve regurgitation. No evidence of mitral stenosis.  4. The aortic valve was not well visualized. Aortic valve regurgitation is not visualized. Mild aortic valve sclerosis is present, with no evidence of aortic valve stenosis.  5. The inferior vena cava is normal in size with greater than 50% respiratory variability, suggesting right atrial pressure of 3 mmHg. FINDINGS  Left Ventricle: Left ventricular ejection fraction, by estimation, is 60 to 65%. The left ventricle has normal function. The left ventricle has no regional wall motion abnormalities. The average left ventricular global longitudinal strain is -20.3 %. The global longitudinal strain is normal. The left ventricular internal cavity size was normal in size. There is mild left ventricular hypertrophy. Left ventricular diastolic parameters are consistent with Grade I diastolic dysfunction (impaired relaxation). Right Ventricle: The right ventricular size is normal. No increase in right ventricular wall thickness. Right ventricular systolic function is normal. Left Atrium: Left atrial size was normal in size. Right Atrium: Right atrial size was normal in size. Pericardium: There is no evidence of pericardial effusion. Mitral Valve: The mitral valve is normal in structure. No evidence of mitral valve regurgitation. No evidence of mitral valve stenosis. Tricuspid Valve: The tricuspid valve is normal in structure. Tricuspid valve regurgitation is not demonstrated. No evidence of tricuspid stenosis. Aortic Valve: The aortic valve was not well visualized. Aortic valve regurgitation is not visualized. Mild aortic valve sclerosis is present, with no evidence of aortic valve stenosis. Aortic  valve peak gradient measures 11.1 mmHg. Pulmonic Valve: The pulmonic valve was normal in structure. Pulmonic valve regurgitation is trivial. No evidence of pulmonic stenosis. Aorta: The aortic root is normal in size and structure. Venous: The inferior vena cava is normal in size with greater than 50% respiratory variability, suggesting right atrial pressure of 3 mmHg. IAS/Shunts: No atrial level shunt detected by color flow Doppler.  LEFT VENTRICLE PLAX 2D LVIDd:         4.50 cm  Diastology LVIDs:         3.20 cm  LV e' medial:    5.33 cm/s LV PW:         1.10 cm  LV E/e' medial:  16.7 LV IVS:        1.30 cm  LV e' lateral:   5.33 cm/s LVOT diam:     2.00 cm  LV E/e' lateral: 16.7 LVOT Area:     3.14 cm  2D Longitudinal Strain                         2D Strain GLS Avg:     -20.3 % RIGHT VENTRICLE RV Basal diam:  3.00 cm RV Mid diam:    2.50 cm RV S prime:     14.00 cm/s TAPSE (M-mode): 2.4 cm LEFT ATRIUM             Index       RIGHT ATRIUM           Index LA diam:        3.90 cm 2.03 cm/m  RA Area:     15.40 cm LA Vol (A2C):   55.9 ml 29.08 ml/m RA Volume:   38.90 ml  20.23 ml/m LA Vol (A4C):   53.7 ml 27.93 ml/m LA Biplane Vol: 56.7 ml 29.49 ml/m  AORTIC VALVE                PULMONIC VALVE AV Area (Vmax): 2.57 cm    PV Vmax:          1.05 m/s AV Vmax:        166.50 cm/s PV Peak grad:     4.4 mmHg AV Peak Grad:   11.1 mmHg   PR End Diast Vel: 4.84 msec LVOT Vmax:      136.00 cm/s RVOT Peak grad:   4 mmHg  AORTA Ao Root diam: 2.80 cm MITRAL VALVE MV Area (PHT): 3.39 cm     SHUNTS MV Decel Time: 224 msec     Systemic Diam: 2.00 cm MV E velocity: 89.20 cm/s MV A velocity: 123.00 cm/s MV E/A ratio:  0.73 MV A Prime:    11.0 cm/s Ida Rogue MD Electronically signed by Ida Rogue MD Signature Date/Time: 06/07/2021/6:32:37 PM    Final       ASSESSMENT & PLAN:  1. Anemia due to stage 4 chronic kidney disease (HCC)   2. Stage 4 chronic kidney disease (HCC)    Chronic anemia, is  likely due to CKD.  Rule out etiologies.  Check cbc, cmp, iron tibc ferritin, B12, LDH, folate, multiple myeloma and light chain ratio, retic panel.  Discussed about rationale of aggressively increase iron store, and possible need of erythropoietin replacement therapy. Recommend IV venofer  Plan IV iron with Venofer 275m weekly x 3 doses. Allergy reactions/infusion reaction including anaphylactic reaction discussed with patient. Other side effects include but not limited to high blood pressure, skin rash, weight gain, leg swelling, etc. Patient voices understanding and will think out the option.   Labs are available after her visit and was reviewed. Ferritin 112, and iron saturation 19. Will offer her IV venofer.   Follow up in 8 weeks, labs MD +/- venofer Orders Placed This Encounter  Procedures   Ferritin    Standing Status:   Future    Number of Occurrences:   1    Standing Expiration Date:   01/04/2022   Iron and TIBC    Standing Status:   Future    Number of Occurrences:   1    Standing Expiration Date:   07/06/2022   CBC with Differential/Platelet    Standing Status:   Future    Number of Occurrences:   1    Standing Expiration Date:   07/06/2022   Comprehensive metabolic panel    Standing Status:   Future    Number of Occurrences:   1  Standing Expiration Date:   07/06/2022   Vitamin B12    Standing Status:   Future    Number of Occurrences:   1    Standing Expiration Date:   07/06/2022   Folate    Standing Status:   Future    Number of Occurrences:   1    Standing Expiration Date:   07/06/2022   Multiple Myeloma Panel (SPEP&IFE w/QIG)    Standing Status:   Future    Number of Occurrences:   1    Standing Expiration Date:   07/06/2022   Kappa/lambda light chains    Standing Status:   Future    Number of Occurrences:   1    Standing Expiration Date:   07/06/2022   Retic Panel    Standing Status:   Future    Number of Occurrences:   1    Standing Expiration Date:    07/06/2022   Lactate dehydrogenase    Standing Status:   Future    Number of Occurrences:   1    Standing Expiration Date:   07/06/2022    All questions were answered. The patient knows to call the clinic with any problems questions or concerns.  cc Anthonette Legato, MD    Thank you for this kind referral and the opportunity to participate in the care of this patient. A copy of today's note is routed to referring provider    Earlie Server, MD, PhD Hematology Oncology [place] [facility]  07/06/2021

## 2021-07-07 ENCOUNTER — Telehealth: Payer: Self-pay

## 2021-07-07 LAB — KAPPA/LAMBDA LIGHT CHAINS
Kappa free light chain: 74.5 mg/L — ABNORMAL HIGH (ref 3.3–19.4)
Kappa, lambda light chain ratio: 1.69 — ABNORMAL HIGH (ref 0.26–1.65)
Lambda free light chains: 44.1 mg/L — ABNORMAL HIGH (ref 5.7–26.3)

## 2021-07-07 NOTE — Telephone Encounter (Signed)
Called pt and no answer. Will call back again later.

## 2021-07-07 NOTE — Telephone Encounter (Signed)
Pt Informed of MD plan.   Please schedule patient and notify her of appts.   Venofer weekly x3  Labs in 8 weeks  MD/ poss venofer or retacrit 1-2 days after labs

## 2021-07-07 NOTE — Telephone Encounter (Signed)
-----   Message from Earlie Server, MD sent at 07/06/2021  7:36 PM EDT ----- Let patient know that her hemoglobin was 8.5, iron panel was normal, but not ideal yet. I recommend weekly Venofer x 3.  Follow up in 8 weeks labs prior- ordered, MD +/- Venofer +/- retacrit

## 2021-07-08 ENCOUNTER — Ambulatory Visit: Payer: Medicare Other

## 2021-07-08 DIAGNOSIS — I252 Old myocardial infarction: Secondary | ICD-10-CM

## 2021-07-08 DIAGNOSIS — I129 Hypertensive chronic kidney disease with stage 1 through stage 4 chronic kidney disease, or unspecified chronic kidney disease: Secondary | ICD-10-CM

## 2021-07-08 DIAGNOSIS — E890 Postprocedural hypothyroidism: Secondary | ICD-10-CM

## 2021-07-08 DIAGNOSIS — N183 Chronic kidney disease, stage 3 unspecified: Secondary | ICD-10-CM

## 2021-07-08 DIAGNOSIS — E7801 Familial hypercholesterolemia: Secondary | ICD-10-CM

## 2021-07-08 LAB — MULTIPLE MYELOMA PANEL, SERUM
Albumin SerPl Elph-Mcnc: 3.3 g/dL (ref 2.9–4.4)
Albumin/Glob SerPl: 1.3 (ref 0.7–1.7)
Alpha 1: 0.3 g/dL (ref 0.0–0.4)
Alpha2 Glob SerPl Elph-Mcnc: 0.7 g/dL (ref 0.4–1.0)
B-Globulin SerPl Elph-Mcnc: 1.1 g/dL (ref 0.7–1.3)
Gamma Glob SerPl Elph-Mcnc: 0.6 g/dL (ref 0.4–1.8)
Globulin, Total: 2.7 g/dL (ref 2.2–3.9)
IgA: 339 mg/dL (ref 64–422)
IgG (Immunoglobin G), Serum: 731 mg/dL (ref 586–1602)
IgM (Immunoglobulin M), Srm: 15 mg/dL — ABNORMAL LOW (ref 26–217)
Total Protein ELP: 6 g/dL (ref 6.0–8.5)

## 2021-07-08 NOTE — Progress Notes (Signed)
Chronic Care Management Pharmacy Note  07/27/2021 Name:  Betty Nielsen MRN:  [id number] DOB:  12-Dec-1945  Summary: Patients presents for Initial CCM Consult.  -Patient reports significant dizziness ~30 minutes after taking labetalol   Recommendations/Changes made from today's visit: Continue current medications -Recommend stopping HCTZ due to ineffective in CKD stage 4.   Plan: CPP follow-up 3 months   Subjective: Betty Nielsen is an 75 y.o. year old female who is a primary patient of Alba Cory, MD.  The CCM team was consulted for assistance with disease management and care coordination needs.    Engaged with patient by telephone for initial visit in response to provider referral for pharmacy case management and/or care coordination services.   Consent to Services:  The patient was given the following information about Chronic Care Management services today, agreed to services, and gave verbal consent: 1. CCM service includes personalized support from designated clinical staff supervised by the primary care provider, including individualized plan of care and coordination with other care providers 2. 24/7 contact phone numbers for assistance for urgent and routine care needs. 3. Service will only be billed when office clinical staff spend 20 minutes or more in a month to coordinate care. 4. Only one practitioner may furnish and bill the service in a calendar month. 5.The patient may stop CCM services at any time (effective at the end of the month) by phone call to the office staff. 6. The patient will be responsible for cost sharing (co-pay) of up to 20% of the service fee (after annual deductible is met). Patient agreed to services and consent obtained.  Patient Care Team: Alba Cory, MD as PCP - General (Family Medicine) Lamont Dowdy, MD as Consulting Physician (Internal Medicine) Alwyn Pea, MD as Consulting Physician (Cardiology) Kandyce Rud., MD  as Consulting Physician (Rheumatology) Chuck Hint, MD as Consulting Physician (Vascular Surgery) Bud Face, MD as Referring Physician (Otolaryngology) Juanell Fairly, RN as Case Manager Gaspar Cola, [facility] (Pharmacist) Rickard Patience, MD as Consulting Physician (Hematology and Oncology)  Recent office visits: 07/01/2021 Alba Cory, MD (PCP Office Visit) for Follow-up. 06/19/2021 Alba Cory, MD (PCP Office Visit) for Hospital Follow-up- Started Ezetimibe 10 mg Daily, no orders placed; No follow-up noted  Recent consult visits: 06/29/2021 Dwayne Dorthey Sawyer, MD (Cardiology) for Follow-up- No medication changes noted; NM myocardial perfusion SPECT multiple test ordered; ECG Stress test only ordered; (appears the note is not all the way complete)   06/22/2021 Munsoor Lateef, MD (Nephrology) for Follow-up- No medication changes noted; no orders placed; patient instructed to follow-up in 2 weeks.  Hospital visits: Admitted to the hospital on 06/06/2021 due to CVA. Discharge date was 06/17/2021. Discharged from [facility].     New?Medications Started at [facility] Discharge:?? -START taking: Aspirin 81 MG EC tablet Take 1 tablet (81 mg total) by mouth daily. cloNIDine (CATAPRES - Dosed in mg/24 hr) 0.2 mg/24hr patch Place 1 patch (0.2 mg total) onto the skin every 7 days. Replaces: cloNIDine 0.2 MG tablet hydrochlorothiazide (MICROZIDE) 12.5 MG capsule Take 1 capsule (12.5 mg total) by mouth daily. patiromer Lelon Perla) g packet Start taking on: June 18, 2021 Take 1 packet (8.4 g total) by mouth daily. For: High Amount of Potassium in the Blood Start taking on: June 18, 2021   Medication Changes at Hospital Discharge: -CHANGE how you take: labetalol 200 MG tablet Take 1 tablet (200 mg total) by mouth 2 (two) times daily. What changed: medication  strength how much to take   Medications Discontinued at Hospital  Discharge: -STOP taking: cloNIDine 0.2 MG tablet (CATAPRES) Replaced by a similar medication. irbesartan 300 MG tablet (AVAPRO) montelukast 10 MG tablet (SINGULAIR)   Medications that remain the same after Hospital Discharge:??  -All other medications will remain the same.     Objective:  Lab Results  Component Value Date   CREATININE 3.13 (H) 07/06/2021   BUN 36 (H) 07/06/2021   GFRNONAA 15 (L) 07/06/2021   GFRAA 31 (L) 06/18/2020   NA 131 (L) 07/06/2021   K 3.9 07/06/2021   CALCIUM 9.1 07/06/2021   CO2 22 07/06/2021   GLUCOSE 103 (H) 07/06/2021    Lab Results  Component Value Date/Time   HGBA1C 5.7 (H) 06/07/2021 07:40 AM   HGBA1C 5.8 (H) 06/18/2020 01:57 PM    Last diabetic Eye exam: No results found for: HMDIABEYEEXA  Last diabetic Foot exam: No results found for: HMDIABFOOTEX   Lab Results  Component Value Date   CHOL 217 (H) 07/01/2021   HDL 75 07/01/2021   LDLCALC 124 (H) 07/01/2021   TRIG 83 07/01/2021   CHOLHDL 2.9 07/01/2021    Hepatic Function Latest Ref Rng & Units 07/06/2021 06/10/2021 06/09/2021  Total Protein 6.5 - 8.1 g/dL 6.5 - -  Albumin 3.5 - 5.0 g/dL 3.7 3.2(L) 3.4(L)  AST 15 - 41 U/L 21 - -  ALT 0 - 44 U/L 12 - -  Alk Phosphatase 38 - 126 U/L 64 - -  Total Bilirubin 0.3 - 1.2 mg/dL 0.5 - -  Bilirubin, Direct 0.0 - 0.2 mg/dL - - -    Lab Results  Component Value Date/Time   TSH 2.46 07/01/2021 03:37 PM   TSH 0.55 06/18/2020 01:57 PM    CBC Latest Ref Rng & Units 07/06/2021 06/17/2021 06/16/2021  WBC 4.0 - 10.5 K/uL 5.2 6.8 8.1  Hemoglobin 12.0 - 15.0 g/dL 8.5(L) 7.6(L) 7.6(L)  Hematocrit 36.0 - 46.0 % 26.1(L) 23.6(L) 22.8(L)  Platelets 150 - 400 K/uL 213 219 222    Lab Results  Component Value Date/Time   VD25OH 79 06/18/2020 01:57 PM    Clinical ASCVD: Yes  The ASCVD Risk score (Arnett DK, et al., 2019) failed to calculate for the following reasons:   The patient has a prior MI or stroke diagnosis    Depression screen [facility] 2/9  07/02/2021 07/01/2021 06/19/2021  Decreased Interest 0 0 0  Down, Depressed, Hopeless 0 0 0  PHQ - 2 Score 0 0 0  Altered sleeping - - 0  Tired, decreased energy - - 3  Change in appetite - - 0  Feeling bad or failure about yourself  - - 0  Trouble concentrating - - 0  Moving slowly or fidgety/restless - - 0  Suicidal thoughts - - 0  PHQ-9 Score - - 3  Difficult doing work/chores - - -  Some recent data might be hidden    Social History   Tobacco Use  Smoking Status Former   Packs/day: 0.50   Years: 20.00   Pack years: 10.00   Types: Cigarettes   Quit date: 2000   Years since quitting: 22.8  Smokeless Tobacco Never  Tobacco Comments   smoking cessation materials not required   BP Readings from Last 3 Encounters:  07/23/21 124/66  07/16/21 (!) 150/73  07/14/21 (!) 176/77   Pulse Readings from Last 3 Encounters:  07/23/21 63  07/16/21 62  07/14/21 66   Wt Readings from Last 3  Encounters:  07/14/21 176 lb 9.6 oz (80.1 kg)  07/06/21 175 lb 1.6 oz (79.4 kg)  07/01/21 196 lb (88.9 kg)   BMI Readings from Last 3 Encounters:  07/14/21 29.39 kg/m  07/06/21 29.14 kg/m  07/01/21 32.62 kg/m    Assessment/Interventions: Review of patient past medical history, allergies, medications, health status, including review of consultants reports, laboratory and other test data, was performed as part of comprehensive evaluation and provision of chronic care management services.   SDOH:  (Social Determinants of Health) assessments and interventions performed: Yes SDOH Interventions    Flowsheet Row Most Recent Value  SDOH Interventions   Financial Strain Interventions Intervention Not Indicated      SDOH Screenings   Alcohol Screen: Not on file  Depression (PHQ2-9): Low Risk    PHQ-2 Score: 0  Financial Resource Strain: Low Risk    Difficulty of Paying Living Expenses: Not hard at all  Food Insecurity: No Food Insecurity   Worried About Charity fundraiser in the Last  Year: Never true   Ran Out of Food in the Last Year: Never true  Housing: Not on file  Physical Activity: Not on file  Social Connections: Not on file  Stress: Not on file  Tobacco Use: Medium Risk   Smoking Tobacco Use: Former   Smokeless Tobacco Use: Never   Passive Exposure: Not on file  Transportation Needs: No Transportation Needs   Lack of Transportation (Medical): No   Lack of Transportation (Non-Medical): No    CCM Care Plan  Allergies  Allergen Reactions   Ace Inhibitors Cough   Ciprofloxacin Other (See Comments)    unknown   Citalopram     hyponatremia   Hydrocodone-Acetaminophen Other (See Comments)    unknown   Norvasc [Amlodipine Besylate]     Pruritus    Pantoprazole Sodium Other (See Comments)    Cramps   Pravastatin Other (See Comments)    Stomach cramps   Sulfa Antibiotics     unknown   Clonidine Other (See Comments)    Per patient told by allergist that would be best to stop but nephrologist recommended her to stay on medication     Medications Reviewed Today     Reviewed by Algernon Huxley, MD (Physician) on 07/14/21 at [phone]  Med List Status: <None>   Medication Order Taking? Sig Documenting Provider Last Dose Status Informant  acetaminophen (TYLENOL) 650 MG CR tablet [id number] Yes Take 650 mg by mouth every 8 (eight) hours as needed for pain. [provider] Taking Active   aspirin EC 81 MG tablet [id number] Yes Take 81 mg by mouth daily. Swallow whole. [provider] Taking Active   atorvastatin (LIPITOR) 40 MG tablet [id number] Yes Take 1 tablet (40 mg total) by mouth daily at 6 PM. Cholesterol medication. Steele Sizer, MD Taking Active   cetirizine (ZYRTEC) 10 MG tablet [id number] Yes Take 1 tablet (10 mg total) by mouth daily as needed. PRN only Enzo Bi, MD Taking Active Self  Cholecalciferol 125 MCG (5000 UT) TABS [id number] No Take 5,000 Units by mouth daily.  Patient not taking: Reported on 07/14/2021   [provider] Not Taking Active Self  cloNIDine (CATAPRES - DOSED IN MG/24 HR) 0.2 mg/24hr patch [id number] Yes Place 1 patch (0.2 mg total) onto the skin every 7 (seven) days. Loletha Grayer, MD Taking Active   clopidogrel (PLAVIX) 75 MG tablet [id number] Yes TAKE 1 TABLET BY MOUTH ONCE DAILY. BLOOD THINNER TO HELP REDUCE STROKE  RISK Steele Sizer, MD Taking Active Self  ezetimibe (ZETIA) 10 MG tablet [id number] Yes Take 1 tablet (10 mg total) by mouth daily. Steele Sizer, MD Taking Active   hydrochlorothiazide (MICROZIDE) 12.5 MG capsule [id number] Yes Take 1 capsule (12.5 mg total) by mouth daily. Gladstone Pih, NP Taking Active   labetalol (NORMODYNE) 200 MG tablet [id number] Yes Take 1 tablet (200 mg total) by mouth 2 (two) times daily. Loletha Grayer, MD Taking Active   levothyroxine (SYNTHROID) 75 MCG tablet [id number] Yes Take 1 tablet (75 mcg total) by mouth daily before breakfast. Steele Sizer, MD Taking Active Self  NIFEdipine (PROCARDIA XL/NIFEDICAL XL) 60 MG 24 hr tablet [id number] Yes Take 60 mg by mouth daily. Holley Raring Munsoor, MD Taking Active   nitroGLYCERIN (NITROSTAT) 0.4 MG SL tablet [id number] Yes Place under the tongue. [provider] Taking Active Self  Omega-3 Fatty Acids (FISH OIL) 1000 MG CPDR [id number] Yes Take 1 capsule by mouth daily. [provider] Taking Active Self  patiromer (VELTASSA) 8.4 g packet [id number] Yes Take 1 packet (8.4 g total) by mouth daily. Loletha Grayer, MD Taking Active             Patient Active Problem List   Diagnosis Date Noted   Renal artery stenosis ([place]) 07/14/2021   Acute kidney injury superimposed on CKD ([place])    Hyperkalemia    Hypothyroidism    Accelerated hypertension 06/09/2021   Left upper extremity numbness 06/07/2021   Left facial numbness 06/07/2021   Elevated serum creatinine 06/07/2021   CVA (cerebral vascular accident) ([place]) 06/06/2021   Benign hypertension with chronic kidney disease, stage  III ([place]) 06/18/2020   Anemia of chronic disease 11/21/2019   Stage 3b chronic kidney disease ([place]) 11/21/2019   Hypo-osmolality and hyponatremia 11/21/2019   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   Major depression in remission ([place]) 06/05/2015   Obesity (BMI 30.0-34.9) 06/05/2015   Neuropathy 06/05/2015   Perennial allergic rhinitis with seasonal variation 06/05/2015   Arthritis, degenerative 07/03/2014   AAA (abdominal aortic aneurysm) without rupture 06/26/2014    Immunization History  Administered Date(s) Administered   Influenza, High Dose Seasonal PF 06/05/2015, 05/12/2017, 06/03/2019, 07/20/2019   Influenza-Unspecified 05/12/2017, 06/02/2020, 05/28/2021   PFIZER(Purple Top)SARS-COV-2 Vaccination 11/12/2019, 11/30/2019, 09/15/2020   Pneumococcal Conjugate-13 07/17/2014   Pneumococcal Polysaccharide-23 11/24/2015    Conditions to be addressed/monitored:  Hypertension, Hyperlipidemia, GERD, Chronic Kidney Disease, Hypothyroidism, and History of Stroke, NSTEMI  Care Plan : General Pharmacy (Adult)  Updates made by Germaine Pomfret, RPH since 07/27/2021 12:00 AM     Problem: Hypertension, Hyperlipidemia, GERD, Chronic Kidney Disease, Hypothyroidism, and History of Stroke, NSTEMI   Priority: High     Long-Range Goal: Patient-Specific Goal    Start Date: 07/27/2021  Expected End Date: 07/27/2022  This Visit's Progress: On track  Priority: High  Note:   Current Barriers:  Unable to achieve control of cholesterol  Suboptimal therapeutic regimen for  blood pressure  Pharmacist Clinical Goal(s):  Patient will achieve control of cholesterol as evidenced by LDL less than 70 through collaboration with PharmD and provider.   Interventions: 1:1 collaboration with Steele Sizer, MD regarding development and update of comprehensive plan of care as evidenced by provider attestation and co-signature Inter-disciplinary care team collaboration (see longitudinal plan of care) Comprehensive medication review performed; medication list updated in electronic medical record  Hypertension (BP goal <140/90) -Not ideally controlled -Current treatment: Clonidine 0.2mg  patch every 7 days  HCTZ 12.5 mg daily  Labetalol 200 mg twice daily  Nifedipine XL 60 mg daily  -Medications previously tried: Irbesartan, Hydralazine,   -Patient reports significant dizziness ~30 minutes after taking labetalol -Reports hypotensive/hypertensive symptoms -Recommended to continue current medication  Hyperlipidemia: (LDL goal < 70) -Uncontrolled -Current treatment: Atorvastatin 40 mg daily  Ezetimibe 10 mg daily  -Medications previously tried: NA  -Recommended to continue current medication  Hypothyroidism (Goal: maintain thyroid function) -Controlled -Current treatment  Levothyroxine 75 mcg daily  -Medications previously tried: NA  -Recommended to continue current medication  Chronic Kidney Disease Stage 4  -All medications assessed for renal dosing and appropriateness in chronic kidney disease. -Recommend stopping HCTZ due to ineffective in CKD stage 4.  -Recommended to continue current medication   Patient Goals/Self-Care Activities Patient will:  - check blood pressure 3 times weekly, document, and provide at future appointments  Follow Up  Plan: Telephone follow up appointment with care management team member scheduled for:  10/07/2021 at 3:00 PM      Medication Assistance: None required.  Patient affirms current coverage meets needs.  Compliance/Adherence/Medication fill history: Care Gaps: Colonoscopy Zoster Vaccines Dexa Scan COVID-19 Vaccine Booster 4  Star-Rating Drugs: Atorvastatin 40 mg last filled on 04/06/2021 for a 90-Day supply with [place]  Patient's preferred pharmacy is:  [facility] [address]), [place] [facility] [place]) [place] [address] Phone: [phone] Fax: [phone]  Uses pill box? Yes Pt endorses 100% compliance  We discussed: Current pharmacy is preferred with insurance plan and patient is satisfied with pharmacy services Patient decided to: Continue current medication management strategy  Care Plan and Follow Up Patient Decision:  Patient agrees to Care Plan and Follow-up.  Plan: Telephone follow up appointment with care management team member scheduled for:  10/07/2021 at 3:00 PM  Malva Limes, [facility] [phone]

## 2021-07-10 ENCOUNTER — Telehealth: Payer: Self-pay | Admitting: Oncology

## 2021-07-13 ENCOUNTER — Telehealth: Payer: Self-pay | Admitting: Oncology

## 2021-07-13 ENCOUNTER — Encounter: Payer: Self-pay | Admitting: Oncology

## 2021-07-13 NOTE — Telephone Encounter (Signed)
Left VM with patient to make her aware of iron infusions scheduled and follow-up with Dr. Tasia Catchings. Sending updated AVS in the mail also.

## 2021-07-13 NOTE — Telephone Encounter (Signed)
Jenn, will you please scheduled appts and notify pt of appts. Thanks

## 2021-07-14 ENCOUNTER — Other Ambulatory Visit: Payer: Self-pay

## 2021-07-14 ENCOUNTER — Other Ambulatory Visit: Payer: Self-pay | Admitting: Family Medicine

## 2021-07-14 ENCOUNTER — Encounter (INDEPENDENT_AMBULATORY_CARE_PROVIDER_SITE_OTHER): Payer: Self-pay | Admitting: Vascular Surgery

## 2021-07-14 ENCOUNTER — Ambulatory Visit (INDEPENDENT_AMBULATORY_CARE_PROVIDER_SITE_OTHER): Payer: Medicare Other | Admitting: Vascular Surgery

## 2021-07-14 ENCOUNTER — Telehealth: Payer: Self-pay

## 2021-07-14 VITALS — BP 176/77 | HR 66 | Resp 16 | Wt 176.6 lb

## 2021-07-14 DIAGNOSIS — I701 Atherosclerosis of renal artery: Secondary | ICD-10-CM | POA: Diagnosis not present

## 2021-07-14 DIAGNOSIS — E890 Postprocedural hypothyroidism: Secondary | ICD-10-CM

## 2021-07-14 DIAGNOSIS — I150 Renovascular hypertension: Secondary | ICD-10-CM

## 2021-07-14 DIAGNOSIS — E7801 Familial hypercholesterolemia: Secondary | ICD-10-CM

## 2021-07-14 DIAGNOSIS — I714 Abdominal aortic aneurysm, without rupture, unspecified: Secondary | ICD-10-CM

## 2021-07-14 MED ORDER — LEVOTHYROXINE SODIUM 75 MCG PO TABS: 75.0000 ug | ORAL_TABLET | Freq: Every day | ORAL | 1 refills | Status: DC

## 2021-07-14 NOTE — Assessment & Plan Note (Signed)
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  At this point, may need to be followed with a CT in the future

## 2021-07-14 NOTE — Assessment & Plan Note (Signed)
Status post right renal artery stent placement with improvement in blood pressure.  Renal function appears to stabilize.  At this point, plan to see her back in 1 month with a renal duplex.

## 2021-07-14 NOTE — Assessment & Plan Note (Signed)
Improvement after renal artery stenting.  Blood pressure management as per primary care physician.

## 2021-07-14 NOTE — Telephone Encounter (Signed)
Pt was seen last week for her Betty Nielsen follow up and stated her levothyroxine was never sent to her pharmacy. [facility]. She is completely out

## 2021-07-14 NOTE — Progress Notes (Signed)
MRN : [id number]  Betty Nielsen is a 75 y.o. (07-11-1946) female who presents with chief complaint of  Chief Complaint  Patient presents with   Follow-up    [facility] 3wk post renal angio  .  History of Present Illness: Patient returns today in follow up of her renal artery stenosis.  About 4 weeks ago, she underwent right renal artery stent placement for high-grade stenosis with a solitary kidney with what appeared to be an occlusion of the left renal artery.  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  This did not appear to be large enough to require repair at this time.  Her blood pressure has been much better.  Her primary care physician switched her from a clonidine patch to pills which just started yesterday.  She reports her blood pressures been running mostly in the 120s and 130s at home although it is elevated today.  No access related complications or other problems following the procedure.  Her creatinine which was as high as 5.6 has gone down to 3.1 today and appears to be nearing baseline.  Current Outpatient Medications  Medication Sig Dispense Refill   acetaminophen (TYLENOL) 650 MG CR tablet Take 650 mg by mouth every 8 (eight) hours as needed for pain.     aspirin EC 81 MG tablet Take 81 mg by mouth daily. Swallow whole.     atorvastatin (LIPITOR) 40 MG tablet Take 1 tablet (40 mg total) by mouth daily at 6 PM. Cholesterol medication. 90 tablet 1   cetirizine (ZYRTEC) 10 MG tablet Take 1 tablet (10 mg total) by mouth daily as needed. PRN only     cloNIDine (CATAPRES - DOSED IN MG/24 HR) 0.2 mg/24hr patch Place 1 patch (0.2 mg total) onto the skin every 7 (seven) days. 4 patch 0   clopidogrel (PLAVIX) 75 MG tablet TAKE 1 TABLET BY MOUTH ONCE DAILY. BLOOD THINNER TO HELP REDUCE STROKE RISK 90 tablet 1   ezetimibe (ZETIA) 10 MG tablet Take 1 tablet (10 mg total) by mouth  daily. 90 tablet 1   hydrochlorothiazide (MICROZIDE) 12.5 MG capsule Take 1 capsule (12.5 mg total) by mouth daily. 30 capsule 0   labetalol (NORMODYNE) 200 MG tablet Take 1 tablet (200 mg total) by mouth 2 (two) times daily. 60 tablet 0   levothyroxine (SYNTHROID) 75 MCG tablet Take 1 tablet (75 mcg total) by mouth daily before breakfast. 90 tablet 1   NIFEdipine (PROCARDIA XL/NIFEDICAL XL) 60 MG 24 hr tablet Take 60 mg by mouth daily.     nitroGLYCERIN (NITROSTAT) 0.4 MG SL tablet Place under the tongue.     Omega-3 Fatty Acids (FISH OIL) 1000 MG CPDR Take 1 capsule by mouth daily.     patiromer (VELTASSA) 8.4 g packet Take 1 packet (8.4 g total) by mouth daily. 30 each 0   Cholecalciferol 125 MCG (5000 UT) TABS Take 5,000 Units by mouth daily. (Patient not taking: Reported on 07/14/2021)     No current facility-administered medications for this visit.    Past Medical History:  Diagnosis Date   Arthrosis of knee    Chronic kidney disease    Hyperlipidemia    Hypertension    Loss of hearing    Metabolic syndrome    Plantar fasciitis    Renal insufficiency     Past Surgical History:  Procedure Laterality Date  ABDOMINAL AORTIC ANEURYSM REPAIR     ABDOMINAL HYSTERECTOMY     APPENDECTOMY     CORONARY STENT INTERVENTION N/A 10/26/2016   Procedure: Coronary Stent Intervention;  Surgeon: Yolonda Kida, MD;  Location: [place] [facility];  Service: Cardiovascular;  Laterality: N/A;   HERNIA REPAIR     INNER EAR SURGERY     pt not sure of type   LEFT HEART CATH AND CORONARY ANGIOGRAPHY N/A 10/26/2016   Procedure: Left Heart Cath and Coronary Angiography;  Surgeon: Teodoro Spray, MD;  Location: [place] [facility];  Service: Cardiovascular;  Laterality: N/A;   RENAL ANGIOGRAPHY N/A 06/11/2021   Procedure: RENAL ANGIOGRAPHY;  Surgeon: Algernon Huxley, MD;  Location: [facility];  Service: Cardiovascular;  Laterality: N/A;   THYROIDECTOMY       Social History   Tobacco Use    Smoking status: Former    Packs/day: 0.50    Years: 20.00    Pack years: 10.00    Types: Cigarettes    Quit date: 2000    Years since quitting: 22.8   Smokeless tobacco: Never   Tobacco comments:    smoking cessation materials not required  Vaping Use   Vaping Use: Never used  Substance Use Topics   Alcohol use: No    Alcohol/week: 0.0 standard drinks   Drug use: No       Family History  Problem Relation Age of Onset   Healthy Mother    Cerebral aneurysm Father      Allergies  Allergen Reactions   Ace Inhibitors Cough   Ciprofloxacin Other (See Comments)    unknown   Citalopram     hyponatremia   Hydrocodone-Acetaminophen Other (See Comments)    unknown   Norvasc [Amlodipine Besylate]     Pruritus    Pantoprazole Sodium Other (See Comments)    Cramps   Pravastatin Other (See Comments)    Stomach cramps   Sulfa Antibiotics     unknown   Clonidine Other (See Comments)    Per patient told by allergist that would be best to stop but nephrologist recommended her to stay on medication      REVIEW OF SYSTEMS (Negative unless checked)  Constitutional: []Weight loss  []Fever  []Chills Cardiac: []Chest pain   []Chest pressure   []Palpitations   []Shortness of breath when laying flat   []Shortness of breath at rest   []Shortness of breath with exertion. Vascular:  []Pain in legs with walking   []Pain in legs at rest   []Pain in legs when laying flat   []Claudication   []Pain in feet when walking  []Pain in feet at rest  []Pain in feet when laying flat   []History of DVT   []Phlebitis   []Swelling in legs   []Varicose veins   []Non-healing ulcers Pulmonary:   []Uses home oxygen   []Productive cough   []Hemoptysis   []Wheeze  []COPD   []Asthma Neurologic:  []Dizziness  []Blackouts   []Seizures   []History of stroke   []History of TIA  []Aphasia   []Temporary blindness   []Dysphagia   []Weakness or numbness in arms   []Weakness or numbness in legs Musculoskeletal:   [x]Arthritis   []Joint swelling   [x]Joint pain   []Low back pain Hematologic:  []Easy bruising  []Easy bleeding   []Hypercoagulable state   []Anemic   Gastrointestinal:  []Blood in stool   []Vomiting blood  [x]Gastroesophageal reflux/heartburn   []Abdominal pain  Genitourinary:  [x]Chronic kidney disease   []Difficult urination  []Frequent urination  []Burning with urination   []Hematuria Skin:  []Rashes   []Ulcers   []Wounds Psychological:  []History of anxiety   [] History of major depression.  Physical Examination  BP (!) 176/77 (BP Location: Right Arm)   Pulse 66   Resp 16   Wt 176 lb 9.6 oz (80.1 kg)   BMI 29.39 kg/m  Gen:  WD/WN, NAD Head: [place]/AT, No temporalis wasting. Ear/Nose/Throat: Hearing grossly intact, nares w/o erythema or drainage Eyes: Conjunctiva clear. Sclera non-icteric Neck: Supple.  Trachea midline Pulmonary:  Good air movement, no use of accessory muscles.  Cardiac: RRR, no JVD Vascular:  Vessel Right Left  Radial Palpable Palpable           Musculoskeletal: M/S 5/5 throughout.  No deformity or atrophy.  Mild lower extremity edema. Neurologic: Sensation grossly intact in extremities.  Symmetrical.  Speech is fluent.  Psychiatric: Judgment intact, Mood & affect appropriate for pt's clinical situation. Dermatologic: No rashes or ulcers noted.  No cellulitis or open wounds.      Labs Recent Results (from the past 2160 hour(s))  Protime-INR     Status: None   Collection Time: 06/06/21  9:10 PM  Result Value Ref Range   Prothrombin Time 13.2 11.4 - 15.2 seconds   INR 1.0 0.8 - 1.2    Comment: (NOTE) INR goal varies based on device and disease states. Performed at [facility], [address] [place], [place] [address]   APTT     Status: None   Collection Time: 06/06/21  9:10 PM  Result Value Ref Range   aPTT 29 24 - 36 seconds    Comment: Performed at [facility], [address] [place], [place] [address]  CBC     Status:  Abnormal   Collection Time: 06/06/21  9:10 PM  Result Value Ref Range   WBC 5.8 4.0 - 10.5 K/uL   RBC 3.18 (L) 3.87 - 5.11 MIL/uL   Hemoglobin 10.0 (L) 12.0 - 15.0 g/dL   HCT 29.9 (L) 36.0 - 46.0 %   MCV 94.0 80.0 - 100.0 fL   MCH 31.4 26.0 - 34.0 pg   MCHC 33.4 30.0 - 36.0 g/dL   RDW 12.4 11.5 - 15.5 %   Platelets 213 150 - 400 K/uL   nRBC 0.0 0.0 - 0.2 %    Comment: Performed at [facility], [address] [place], [place] [address]  Differential     Status: None   Collection Time: 06/06/21  9:10 PM  Result Value Ref Range   Neutrophils Relative % 43 %   Neutro Abs 2.5 1.7 - 7.7 K/uL   Lymphocytes Relative 42 %   Lymphs Abs 2.4 0.7 - 4.0 K/uL   Monocytes Relative 9 %   Monocytes Absolute 0.5 0.1 - 1.0 K/uL   Eosinophils Relative 5 %   Eosinophils Absolute 0.3 0.0 - 0.5 K/uL   Basophils Relative 1 %   Basophils Absolute 0.0 0.0 - 0.1 K/uL   Immature Granulocytes 0 %   Abs Immature Granulocytes 0.01 0.00 - 0.07 K/uL    Comment: Performed at [facility], [address] [place],  [address]  Comprehensive metabolic panel     Status: Abnormal   Collection Time: 06/06/21  9:10 PM  Result Value Ref Range   Sodium 131 (L) 135 - 145 mmol/L   Potassium 4.4 3.5 - 5.1 mmol/L  Chloride 98 98 - 111 mmol/L   CO2 25 22 - 32 mmol/L   Glucose, Bld 119 (H) 70 - 99 mg/dL    Comment: Glucose reference range applies only to samples taken after fasting for at least 8 hours.   BUN 26 (H) 8 - 23 mg/dL   Creatinine, Ser 1.98 (H) 0.44 - 1.00 mg/dL   Calcium 9.0 8.9 - 10.3 mg/dL   Total Protein 6.6 6.5 - 8.1 g/dL   Albumin 3.7 3.5 - 5.0 g/dL   AST 20 15 - 41 U/L   ALT 12 0 - 44 U/L   Alkaline Phosphatase 89 38 - 126 U/L   Total Bilirubin 0.6 0.3 - 1.2 mg/dL   GFR, Estimated 26 (L) >60 mL/min    Comment: (NOTE) Calculated using the CKD-EPI Creatinine Equation (2021)    Anion gap 8 5 - 15    Comment: Performed at [facility], [address]  [place], [place] [address]  CBG monitoring, ED     Status: Abnormal   Collection Time: 06/06/21  9:10 PM  Result Value Ref Range   Glucose-Capillary 114 (H) 70 - 99 mg/dL    Comment: Glucose reference range applies only to samples taken after fasting for at least 8 hours.  Brain natriuretic peptide     Status: Abnormal   Collection Time: 06/06/21  9:10 PM  Result Value Ref Range   B Natriuretic Peptide 156.9 (H) 0.0 - 100.0 pg/mL    Comment: Performed at [facility], [place], [place] [address]  Troponin I (High Sensitivity)     Status: None   Collection Time: 06/06/21  9:57 PM  Result Value Ref Range   Troponin I (High Sensitivity) 9 <18 ng/L    Comment: (NOTE) Elevated high sensitivity troponin I (hsTnI) values and significant  changes across serial measurements may suggest ACS but many other  chronic and acute conditions are known to elevate hsTnI results.  Refer to the "Links" section for chest pain algorithms and additional  guidance. Performed at [facility], [address] [place],  [address]   Resp Panel by RT-PCR (Flu A&B, Covid) Nasopharyngeal Swab     Status: None   Collection Time: 06/06/21  9:57 PM   Specimen: Nasopharyngeal Swab; Nasopharyngeal(NP) swabs in vial transport medium  Result Value Ref Range   SARS Coronavirus 2 by RT PCR NEGATIVE NEGATIVE    Comment: (NOTE) SARS-CoV-2 target nucleic acids are NOT DETECTED.  The SARS-CoV-2 RNA is generally detectable in upper respiratory specimens during the acute phase of infection. The lowest concentration of SARS-CoV-2 viral copies this assay can detect is 138 copies/mL. A negative result does not preclude SARS-Cov-2 infection and should not be used as the sole basis for treatment or other patient management decisions. A negative result may occur with  improper specimen collection/handling, submission of specimen other than nasopharyngeal swab, presence of viral mutation(s)  within the areas targeted by this assay, and inadequate number of viral copies(<138 copies/mL). A negative result must be combined with clinical observations, patient history, and epidemiological information. The expected result is Negative.  Fact Sheet for Patients:  EntrepreneurPulse.com.au  Fact Sheet for Healthcare Providers:  IncredibleEmployment.be  This test is no t yet approved or cleared by the [place] FDA and  has been authorized for detection and/or diagnosis of SARS-CoV-2 by FDA under an Emergency Use Authorization (EUA). This EUA will remain  in effect (meaning this test can be used) for the  duration of the COVID-19 declaration under Section 564(b)(1) of the Act, 21 U.S.C.section 360bbb-3(b)(1), unless the authorization is terminated  or revoked sooner.       Influenza A by PCR NEGATIVE NEGATIVE   Influenza B by PCR NEGATIVE NEGATIVE    Comment: (NOTE) The Xpert Xpress SARS-CoV-2/FLU/RSV plus assay is intended as an aid in the diagnosis of influenza from Nasopharyngeal swab specimens and should not be used as a sole basis for treatment. Nasal washings and aspirates are unacceptable for Xpert Xpress SARS-CoV-2/FLU/RSV testing.  Fact Sheet for Patients: EntrepreneurPulse.com.au  Fact Sheet for Healthcare Providers: IncredibleEmployment.be  This test is not yet approved or cleared by the [place] FDA and has been authorized for detection and/or diagnosis of SARS-CoV-2 by FDA under an Emergency Use Authorization (EUA). This EUA will remain in effect (meaning this test can be used) for the duration of the COVID-19 declaration under Section 564(b)(1) of the Act, 21 U.S.C. section 360bbb-3(b)(1), unless the authorization is terminated or revoked.  Performed at [facility], [place], [place] [address]   Troponin I (High Sensitivity)     Status: None    Collection Time: 06/06/21 11:45 PM  Result Value Ref Range   Troponin I (High Sensitivity) 9 <18 ng/L    Comment: (NOTE) Elevated high sensitivity troponin I (hsTnI) values and significant  changes across serial measurements may suggest ACS but many other  chronic and acute conditions are known to elevate hsTnI results.  Refer to the "Links" section for chest pain algorithms and additional  guidance. Performed at [facility], [address], [place], [place] [address]   Magnesium     Status: None   Collection Time: 06/06/21 11:45 PM  Result Value Ref Range   Magnesium 2.0 1.7 - 2.4 mg/dL    Comment: Performed at [facility], [address] [place], [place] [address]  Magnesium     Status: None   Collection Time: 06/07/21  7:40 AM  Result Value Ref Range   Magnesium 2.0 1.7 - 2.4 mg/dL    Comment: Performed at [facility], [address] [place], [place] [address]  Comprehensive metabolic panel     Status: Abnormal   Collection Time: 06/07/21  7:40 AM  Result Value Ref Range   Sodium 132 (L) 135 - 145 mmol/L   Potassium 4.7 3.5 - 5.1 mmol/L   Chloride 99 98 - 111 mmol/L   CO2 23 22 - 32 mmol/L   Glucose, Bld 109 (H) 70 - 99 mg/dL    Comment: Glucose reference range applies only to samples taken after fasting for at least 8 hours.   BUN 24 (H) 8 - 23 mg/dL   Creatinine, Ser 1.87 (H) 0.44 - 1.00 mg/dL   Calcium 9.6 8.9 - 10.3 mg/dL   Total Protein 6.9 6.5 - 8.1 g/dL   Albumin 3.8 3.5 - 5.0 g/dL   AST 23 15 - 41 U/L   ALT 12 0 - 44 U/L   Alkaline Phosphatase 84 38 - 126 U/L   Total Bilirubin 0.7 0.3 - 1.2 mg/dL   GFR, Estimated 28 (L) >60 mL/min    Comment: (NOTE) Calculated using the CKD-EPI Creatinine Equation (2021)    Anion gap 10 5 - 15    Comment: Performed at [facility], [address] [place], [place] [address]  CBC     Status: Abnormal   Collection Time: 06/07/21  7:40 AM  Result Value Ref Range  WBC 7.8 4.0 - 10.5  K/uL   RBC 3.50 (L) 3.87 - 5.11 MIL/uL   Hemoglobin 10.9 (L) 12.0 - 15.0 g/dL   HCT 32.1 (L) 36.0 - 46.0 %   MCV 91.7 80.0 - 100.0 fL   MCH 31.1 26.0 - 34.0 pg   MCHC 34.0 30.0 - 36.0 g/dL   RDW 12.4 11.5 - 15.5 %   Platelets 233 150 - 400 K/uL   nRBC 0.0 0.0 - 0.2 %    Comment: Performed at [facility], [address] [place], [place] [address]  Hemoglobin A1c     Status: Abnormal   Collection Time: 06/07/21  7:40 AM  Result Value Ref Range   Hgb A1c MFr Bld 5.7 (H) 4.8 - 5.6 %    Comment: (NOTE) Pre diabetes:          5.7%-6.4%  Diabetes:              >6.4%  Glycemic control for   <7.0% adults with diabetes    Mean Plasma Glucose 116.89 mg/dL    Comment: Performed at [facility] [address] [place], [place] [address]  Lipid panel     Status: Abnormal   Collection Time: 06/07/21  7:40 AM  Result Value Ref Range   Cholesterol 214 (H) 0 - 200 mg/dL   Triglycerides 82 <150 mg/dL   HDL 70 >40 mg/dL   Total CHOL/HDL Ratio 3.1 RATIO   VLDL 16 0 - 40 mg/dL   LDL Cholesterol 128 (H) 0 - 99 mg/dL    Comment:        Total Cholesterol/HDL:CHD Risk Coronary Heart Disease Risk Table                     Men   Women  1/2 Average Risk   3.4   3.3  Average Risk       5.0   4.4  2 X Average Risk   9.6   7.1  3 X Average Risk  23.4   11.0        Use the calculated Patient Ratio above and the CHD Risk Table to determine the patient's CHD Risk.        ATP III CLASSIFICATION (LDL):  <100     mg/dL   Optimal  100-129  mg/dL   Near or Above                    Optimal  130-159  mg/dL   Borderline  160-189  mg/dL   High  >190     mg/dL   Very High Performed at [address] [place], [place] [address]   Urinalysis, Complete w Microscopic     Status: Abnormal   Collection Time: 06/07/21  9:12 AM  Result Value Ref Range   Color, Urine YELLOW YELLOW   APPearance CLEAR CLEAR   Specific Gravity, Urine 1.020 1.005 - 1.030   pH 7.0 5.0 -  8.0   Glucose, UA NEGATIVE NEGATIVE mg/dL   Hgb urine dipstick MODERATE (A) NEGATIVE   Bilirubin Urine NEGATIVE NEGATIVE   Ketones, ur NEGATIVE NEGATIVE mg/dL   Protein, ur 100 (A) NEGATIVE mg/dL   Nitrite NEGATIVE NEGATIVE   Leukocytes,Ua NEGATIVE NEGATIVE   RBC / HPF 21-50 0 - 5 RBC/hpf   WBC, UA 0-5 0 - 5 WBC/hpf   Bacteria, UA NONE SEEN NONE SEEN   Squamous Epithelial / LPF 0-5 0 -  5   Mucus PRESENT     Comment: Performed at [facility], [address] [place], [place] [address]  Sodium, urine, random     Status: None   Collection Time: 06/07/21  9:12 AM  Result Value Ref Range   Sodium, Ur 103 mmol/L    Comment: Performed at [facility], [address] [place], [place] [address]  Creatinine, urine, random     Status: None   Collection Time: 06/07/21  9:12 AM  Result Value Ref Range   Creatinine, Urine 29 mg/dL    Comment: Performed at [facility], [address] [place], [place] [address]  ECHOCARDIOGRAM COMPLETE BUBBLE STUDY     Status: None   Collection Time: 06/07/21 12:11 PM  Result Value Ref Range   Ao pk vel 1.67 m/s   AR max vel 2.57 cm2   AV Peak grad 11.1 mmHg   S' Lateral 3.20 cm   Area-P 1/2 3.39 cm2  Renal function panel     Status: Abnormal   Collection Time: 06/08/21  7:07 AM  Result Value Ref Range   Sodium 131 (L) 135 - 145 mmol/L   Potassium 4.5 3.5 - 5.1 mmol/L   Chloride 98 98 - 111 mmol/L   CO2 23 22 - 32 mmol/L   Glucose, Bld 102 (H) 70 - 99 mg/dL    Comment: Glucose reference range applies only to samples taken after fasting for at least 8 hours.   BUN 26 (H) 8 - 23 mg/dL   Creatinine, Ser 2.51 (H) 0.44 - 1.00 mg/dL   Calcium 9.6 8.9 - 10.3 mg/dL   Phosphorus 4.1 2.5 - 4.6 mg/dL   Albumin 3.9 3.5 - 5.0 g/dL   GFR, Estimated 19 (L) >60 mL/min    Comment: (NOTE) Calculated using the CKD-EPI Creatinine Equation (2021)    Anion gap 10 5 - 15    Comment: Performed at [facility], [address] [place], [place] [address]  Magnesium     Status: None   Collection Time: 06/08/21  7:07 AM  Result Value Ref Range   Magnesium 1.9 1.7 - 2.4 mg/dL    Comment: Performed at [facility] [place] [facility], [address] [place], [place] [address]  CBC     Status: Abnormal   Collection Time: 06/08/21  7:07 AM  Result Value Ref Range   WBC 7.0 4.0 - 10.5 K/uL   RBC 3.53 (L) 3.87 - 5.11 MIL/uL   Hemoglobin 10.8 (L) 12.0 - 15.0 g/dL   HCT 32.1 (L) 36.0 - 46.0 %   MCV 90.9 80.0 - 100.0 fL   MCH 30.6 26.0 - 34.0 pg   MCHC 33.6 30.0 - 36.0 g/dL   RDW 12.6 11.5 - 15.5 %   Platelets 255 150 - 400 K/uL   nRBC 0.0 0.0 - 0.2 %    Comment: Performed at [facility], [address] [place], [place] [address]  Renal function panel     Status: Abnormal   Collection Time: 06/09/21  5:05 AM  Result Value Ref Range   Sodium 133 (L) 135 - 145 mmol/L   Potassium 4.2 3.5 - 5.1 mmol/L   Chloride 99 98 - 111 mmol/L   CO2 24 22 - 32 mmol/L   Glucose, Bld 92 70 - 99 mg/dL    Comment: Glucose reference range applies only to samples taken after fasting for at least 8 hours.   BUN 30 (H) 8 - 23 mg/dL  Creatinine, Ser 2.55 (H) 0.44 - 1.00 mg/dL   Calcium 9.2 8.9 - 10.3 mg/dL   Phosphorus 4.4 2.5 - 4.6 mg/dL   Albumin 3.4 (L) 3.5 - 5.0 g/dL   GFR, Estimated 19 (L) >60 mL/min    Comment: (NOTE) Calculated using the CKD-EPI Creatinine Equation (2021)    Anion gap 10 5 - 15    Comment: Performed at [facility], [address] [place], [place] [address]  CBC     Status: Abnormal   Collection Time: 06/09/21  5:05 AM  Result Value Ref Range   WBC 6.7 4.0 - 10.5 K/uL   RBC 3.28 (L) 3.87 - 5.11 MIL/uL   Hemoglobin 10.4 (L) 12.0 - 15.0 g/dL   HCT 30.5 (L) 36.0 - 46.0 %   MCV 93.0 80.0 - 100.0 fL   MCH 31.7 26.0 - 34.0 pg   MCHC 34.1 30.0 - 36.0 g/dL   RDW 12.3 11.5 - 15.5 %   Platelets 242 150 - 400 K/uL   nRBC 0.0 0.0 - 0.2 %    Comment: Performed at [facility], [address] [place], [place] [address]  Magnesium     Status: None   Collection Time: 06/09/21  5:05 AM  Result Value Ref Range   Magnesium 2.1 1.7 - 2.4 mg/dL    Comment: Performed at [facility], [address] [place], [place] [address]  Metanephrines, plasma     Status: None   Collection Time: 06/09/21  5:05 AM  Result Value Ref Range   Normetanephrine, Free 227.5 0.0 - 285.2 pg/mL    Comment: (NOTE) This test was developed and its performance characteristics determined by [facility]. It has not been cleared or approved by the Food and Drug Administration.    Metanephrine, Free 35.9 0.0 - 88.0 pg/mL    Comment: (NOTE) This test was developed and its performance characteristics determined by [facility]. It has not been cleared or approved by the Food and Drug Administration. Performed At: [facility] [place], [place] [id number] Rush Farmer MD [id number]   Glucose, capillary     Status: Abnormal   Collection Time: 06/09/21 10:15 AM  Result Value Ref Range   Glucose-Capillary 133 (H) 70 - 99 mg/dL    Comment: Glucose reference range applies only to samples taken after fasting for at least 8 hours.  Troponin I (High Sensitivity)     Status: Abnormal   Collection Time: 06/09/21 10:43 AM  Result Value Ref Range   Troponin I (High Sensitivity) 21 (H) <18 ng/L    Comment: (NOTE) Elevated high sensitivity troponin I (hsTnI) values and significant  changes across serial measurements may suggest ACS but many other  chronic and acute conditions are known to elevate hsTnI results.  Refer to the "Links" section for chest pain algorithms and additional  guidance. Performed at [facility], [address], [place] [address]   Troponin I (High Sensitivity)     Status: Abnormal   Collection Time: 06/09/21 12:05 PM  Result Value Ref Range   Troponin I (High Sensitivity) 22 (H) <18 ng/L    Comment: (NOTE) Elevated high sensitivity troponin  I (hsTnI) values and significant  changes across serial measurements may suggest ACS but many other  chronic and acute conditions are known to elevate hsTnI results.  Refer to the "Links" section for chest pain algorithms and additional  guidance. Performed at [facility], [address], [place], [place] [address]  Metanephrines, urine, 24 hour     Status: None   Collection Time: 06/09/21  4:50 PM  Result Value Ref Range   Normetanephrine, Ur 365 Undefined ug/L    Comment: (NOTE) This test was developed and its performance characteristics determined by [facility]. It has not been cleared or approved by the Food and Drug Administration.    Normetanephrine, 24H Ur 529 131 - 612 ug/24 hr   Metaneph Total, Ur 72 Undefined ug/L    Comment: (NOTE) This test was developed and its performance characteristics determined by [facility]. It has not been cleared or approved by the Food and Drug Administration.    Metanephrines, 24H Ur 104 36 - 209 ug/24 hr    Comment: (NOTE) Performed At: [facility] [place], [place] [id number] Rush Farmer MD [id number]    Total Volume 1,450     Comment: Performed at [facility], [address] [place], [place] [address]  Catecholamines,Ur.,Free,24 Hh     Status: None   Collection Time: 06/09/21  4:50 PM  Result Value Ref Range   Epinephrine, [facility] ug/L    Comment: (NOTE) Test not performed. The specimen received was not preserved correctly. The pH was too high therefore we are unable to obtain valid results for the following test(s):      Nimisha P notified 06/15/2021 This test was developed and its performance characteristics determined by [facility]. It has not been cleared or approved by the Food and Drug Administration.    Norepinephrine, Rand Ur NOT PERFORMED     Comment: (NOTE) Test not performed This test was developed and its performance characteristics determined by [facility]. It has  not been cleared or approved by the Food and Drug Administration.    Dopamine, Rand Ur NOT PERFORMED     Comment: (NOTE) Test not performed This test was developed and its performance characteristics determined by [facility]. It has not been cleared or approved by the Food and Drug Administration.    Total Volume 1,450     Comment: Performed at [facility], [address] [place], [place] [address]  Renal function panel     Status: Abnormal   Collection Time: 06/10/21  5:10 AM  Result Value Ref Range   Sodium 132 (L) 135 - 145 mmol/L   Potassium 4.6 3.5 - 5.1 mmol/L   Chloride 99 98 - 111 mmol/L   CO2 26 22 - 32 mmol/L   Glucose, Bld 96 70 - 99 mg/dL    Comment: Glucose reference range applies only to samples taken after fasting for at least 8 hours.   BUN 30 (H) 8 - 23 mg/dL   Creatinine, Ser 2.31 (H) 0.44 - 1.00 mg/dL   Calcium 8.9 8.9 - 10.3 mg/dL   Phosphorus 3.8 2.5 - 4.6 mg/dL   Albumin 3.2 (L) 3.5 - 5.0 g/dL   GFR, Estimated 22 (L) >60 mL/min    Comment: (NOTE) Calculated using the CKD-EPI Creatinine Equation (2021)    Anion gap 7 5 - 15    Comment: Performed at [facility], [address] [place], [place] [address]  Magnesium     Status: None   Collection Time: 06/10/21  5:10 AM  Result Value Ref Range   Magnesium 2.1 1.7 - 2.4 mg/dL    Comment: Performed at [facility], [address] [place], [place] [address]  CBC     Status: Abnormal   Collection Time: 06/10/21  5:10 AM  Result Value  Ref Range   WBC 6.7 4.0 - 10.5 K/uL   RBC 3.17 (L) 3.87 - 5.11 MIL/uL   Hemoglobin 9.5 (L) 12.0 - 15.0 g/dL   HCT 29.1 (L) 36.0 - 46.0 %   MCV 91.8 80.0 - 100.0 fL   MCH 30.0 26.0 - 34.0 pg   MCHC 32.6 30.0 - 36.0 g/dL   RDW 12.2 11.5 - 15.5 %   Platelets 229 150 - 400 K/uL   nRBC 0.0 0.0 - 0.2 %    Comment: Performed at [facility], [address] [place], [place] [address]  Aldosterone + renin activity w/ ratio     Status: None    Collection Time: 06/10/21  5:10 AM  Result Value Ref Range   PRA LC/MS/MS 0.241 0.167 - 5.380 ng/mL/hr    Comment: (NOTE) This test was developed and its performance characteristics determined by [facility]. It has not been cleared or approved by the Food and Drug Administration.    ALDO / PRA Ratio 24.1 0.0 - 30.0    Comment: (NOTE)                         Units:      ng/dL per ng/mL/hr Performed At: [facility] [place], [place] [id number] Rush Farmer MD [id number]    Aldosterone 5.8 0.0 - 30.0 ng/dL    Comment: (NOTE) This test was developed and its performance characteristics determined by [facility]. It has not been cleared or approved by the Food and Drug Administration.   CBC     Status: Abnormal   Collection Time: 06/11/21  5:48 AM  Result Value Ref Range   WBC 7.2 4.0 - 10.5 K/uL   RBC 3.15 (L) 3.87 - 5.11 MIL/uL   Hemoglobin 9.6 (L) 12.0 - 15.0 g/dL   HCT 28.6 (L) 36.0 - 46.0 %   MCV 90.8 80.0 - 100.0 fL   MCH 30.5 26.0 - 34.0 pg   MCHC 33.6 30.0 - 36.0 g/dL   RDW 12.3 11.5 - 15.5 %   Platelets 230 150 - 400 K/uL   nRBC 0.0 0.0 - 0.2 %    Comment: Performed at [facility], [address] [place], [place] [address]  Basic metabolic panel     Status: Abnormal   Collection Time: 06/11/21  5:48 AM  Result Value Ref Range   Sodium 130 (L) 135 - 145 mmol/L   Potassium 4.3 3.5 - 5.1 mmol/L   Chloride 97 (L) 98 - 111 mmol/L   CO2 24 22 - 32 mmol/L   Glucose, Bld 92 70 - 99 mg/dL    Comment: Glucose reference range applies only to samples taken after fasting for at least 8 hours.   BUN 31 (H) 8 - 23 mg/dL   Creatinine, Ser 2.16 (H) 0.44 - 1.00 mg/dL   Calcium 9.2 8.9 - 10.3 mg/dL   GFR, Estimated 23 (L) >60 mL/min    Comment: (NOTE) Calculated using the CKD-EPI Creatinine Equation (2021)    Anion gap 9 5 - 15    Comment: Performed at [facility], [address] [place], [place] [address]  CBC     Status: Abnormal    Collection Time: 06/12/21  8:02 AM  Result Value Ref Range   WBC 9.5 4.0 - 10.5 K/uL   RBC 3.33 (L) 3.87 - 5.11 MIL/uL   Hemoglobin 10.3 (L) 12.0 - 15.0 g/dL   HCT 30.7 (L) 36.0 -  46.0 %   MCV 92.2 80.0 - 100.0 fL   MCH 30.9 26.0 - 34.0 pg   MCHC 33.6 30.0 - 36.0 g/dL   RDW 12.7 11.5 - 15.5 %   Platelets 224 150 - 400 K/uL   nRBC 0.0 0.0 - 0.2 %    Comment: Performed at [facility], [address], [place], [place] [address]  Basic metabolic panel     Status: Abnormal   Collection Time: 06/12/21  8:02 AM  Result Value Ref Range   Sodium 130 (L) 135 - 145 mmol/L   Potassium 4.8 3.5 - 5.1 mmol/L   Chloride 98 98 - 111 mmol/L   CO2 23 22 - 32 mmol/L   Glucose, Bld 91 70 - 99 mg/dL    Comment: Glucose reference range applies only to samples taken after fasting for at least 8 hours.   BUN 41 (H) 8 - 23 mg/dL   Creatinine, Ser 4.04 (H) 0.44 - 1.00 mg/dL   Calcium 9.0 8.9 - 10.3 mg/dL   GFR, Estimated 11 (L) >60 mL/min    Comment: (NOTE) Calculated using the CKD-EPI Creatinine Equation (2021)    Anion gap 9 5 - 15    Comment: Performed at [facility], [address] [place], [place] [address]  CBC     Status: Abnormal   Collection Time: 06/13/21  6:42 AM  Result Value Ref Range   WBC 12.4 (H) 4.0 - 10.5 K/uL   RBC 2.99 (L) 3.87 - 5.11 MIL/uL   Hemoglobin 8.9 (L) 12.0 - 15.0 g/dL   HCT 27.3 (L) 36.0 - 46.0 %   MCV 91.3 80.0 - 100.0 fL   MCH 29.8 26.0 - 34.0 pg   MCHC 32.6 30.0 - 36.0 g/dL   RDW 12.5 11.5 - 15.5 %   Platelets 200 150 - 400 K/uL   nRBC 0.0 0.0 - 0.2 %    Comment: Performed at [facility], [address], [place], [place] [address]  Basic metabolic panel     Status: Abnormal   Collection Time: 06/13/21  6:42 AM  Result Value Ref Range   Sodium 129 (L) 135 - 145 mmol/L   Potassium 4.4 3.5 - 5.1 mmol/L   Chloride 96 (L) 98 - 111 mmol/L   CO2 20 (L) 22 - 32 mmol/L   Glucose, Bld 88 70 - 99 mg/dL    Comment: Glucose reference  range applies only to samples taken after fasting for at least 8 hours.   BUN 48 (H) 8 - 23 mg/dL   Creatinine, Ser 5.05 (H) 0.44 - 1.00 mg/dL   Calcium 8.3 (L) 8.9 - 10.3 mg/dL   GFR, Estimated 8 (L) >60 mL/min    Comment: (NOTE) Calculated using the CKD-EPI Creatinine Equation (2021)    Anion gap 13 5 - 15    Comment: Performed at [facility], [address] [place], [place] [address]  CBC     Status: Abnormal   Collection Time: 06/14/21  6:51 AM  Result Value Ref Range   WBC 9.7 4.0 - 10.5 K/uL   RBC 2.65 (L) 3.87 - 5.11 MIL/uL   Hemoglobin 8.5 (L) 12.0 - 15.0 g/dL   HCT 24.8 (L) 36.0 - 46.0 %   MCV 93.6 80.0 - 100.0 fL   MCH 32.1 26.0 - 34.0 pg   MCHC 34.3 30.0 - 36.0 g/dL   RDW 12.7 11.5 - 15.5 %   Platelets 187 150 - 400 K/uL  nRBC 0.0 0.0 - 0.2 %    Comment: Performed at [facility], [address] [place], [place] [address]  Basic metabolic panel     Status: Abnormal   Collection Time: 06/14/21  6:51 AM  Result Value Ref Range   Sodium 129 (L) 135 - 145 mmol/L   Potassium 4.6 3.5 - 5.1 mmol/L   Chloride 99 98 - 111 mmol/L   CO2 19 (L) 22 - 32 mmol/L   Glucose, Bld 95 70 - 99 mg/dL    Comment: Glucose reference range applies only to samples taken after fasting for at least 8 hours.   BUN 48 (H) 8 - 23 mg/dL   Creatinine, Ser 5.67 (H) 0.44 - 1.00 mg/dL   Calcium 8.3 (L) 8.9 - 10.3 mg/dL   GFR, Estimated 7 (L) >60 mL/min    Comment: (NOTE) Calculated using the CKD-EPI Creatinine Equation (2021)    Anion gap 11 5 - 15    Comment: Performed at [facility], [address] [place], [place] [address]  CBC     Status: Abnormal   Collection Time: 06/15/21  6:12 AM  Result Value Ref Range   WBC 8.4 4.0 - 10.5 K/uL   RBC 2.59 (L) 3.87 - 5.11 MIL/uL   Hemoglobin 8.1 (L) 12.0 - 15.0 g/dL   HCT 24.2 (L) 36.0 - 46.0 %   MCV 93.4 80.0 - 100.0 fL   MCH 31.3 26.0 - 34.0 pg   MCHC 33.5 30.0 - 36.0 g/dL   RDW 12.5 11.5 - 15.5 %   Platelets  202 150 - 400 K/uL   nRBC 0.0 0.0 - 0.2 %    Comment: Performed at [facility], [address], [place], [place] [address]  Basic metabolic panel     Status: Abnormal   Collection Time: 06/15/21  6:12 AM  Result Value Ref Range   Sodium 129 (L) 135 - 145 mmol/L   Potassium 4.7 3.5 - 5.1 mmol/L   Chloride 100 98 - 111 mmol/L   CO2 19 (L) 22 - 32 mmol/L   Glucose, Bld 93 70 - 99 mg/dL    Comment: Glucose reference range applies only to samples taken after fasting for at least 8 hours.   BUN 47 (H) 8 - 23 mg/dL   Creatinine, Ser 5.12 (H) 0.44 - 1.00 mg/dL   Calcium 8.3 (L) 8.9 - 10.3 mg/dL   GFR, Estimated 8 (L) >60 mL/min    Comment: (NOTE) Calculated using the CKD-EPI Creatinine Equation (2021)    Anion gap 10 5 - 15    Comment: Performed at [facility], [address] [place], [place] [address]  CBC     Status: Abnormal   Collection Time: 06/16/21  4:42 AM  Result Value Ref Range   WBC 8.1 4.0 - 10.5 K/uL   RBC 2.44 (L) 3.87 - 5.11 MIL/uL   Hemoglobin 7.6 (L) 12.0 - 15.0 g/dL   HCT 22.8 (L) 36.0 - 46.0 %   MCV 93.4 80.0 - 100.0 fL   MCH 31.1 26.0 - 34.0 pg   MCHC 33.3 30.0 - 36.0 g/dL   RDW 12.4 11.5 - 15.5 %   Platelets 222 150 - 400 K/uL   nRBC 0.0 0.0 - 0.2 %    Comment: Performed at [facility], [address], [place], [place] [address]  Basic metabolic panel     Status: Abnormal   Collection Time: 06/16/21  4:42 AM  Result Value  Ref Range   Sodium 130 (L) 135 - 145 mmol/L   Potassium 4.8 3.5 - 5.1 mmol/L   Chloride 103 98 - 111 mmol/L   CO2 18 (L) 22 - 32 mmol/L   Glucose, Bld 85 70 - 99 mg/dL    Comment: Glucose reference range applies only to samples taken after fasting for at least 8 hours.   BUN 44 (H) 8 - 23 mg/dL   Creatinine, Ser 4.95 (H) 0.44 - 1.00 mg/dL   Calcium 8.3 (L) 8.9 - 10.3 mg/dL   GFR, Estimated 9 (L) >60 mL/min    Comment: (NOTE) Calculated using the CKD-EPI Creatinine Equation (2021)    Anion gap 9 5 - 15     Comment: Performed at [facility], [address] [place], [place] [address]  CBC     Status: Abnormal   Collection Time: 06/17/21  5:14 AM  Result Value Ref Range   WBC 6.8 4.0 - 10.5 K/uL   RBC 2.52 (L) 3.87 - 5.11 MIL/uL   Hemoglobin 7.6 (L) 12.0 - 15.0 g/dL   HCT 23.6 (L) 36.0 - 46.0 %   MCV 93.7 80.0 - 100.0 fL   MCH 30.2 26.0 - 34.0 pg   MCHC 32.2 30.0 - 36.0 g/dL   RDW 12.5 11.5 - 15.5 %   Platelets 219 150 - 400 K/uL   nRBC 0.0 0.0 - 0.2 %    Comment: Performed at [facility], [address] [place], [place] [address]  Basic metabolic panel     Status: Abnormal   Collection Time: 06/17/21  5:14 AM  Result Value Ref Range   Sodium 129 (L) 135 - 145 mmol/L   Potassium 5.1 3.5 - 5.1 mmol/L   Chloride 106 98 - 111 mmol/L   CO2 19 (L) 22 - 32 mmol/L   Glucose, Bld 87 70 - 99 mg/dL    Comment: Glucose reference range applies only to samples taken after fasting for at least 8 hours.   BUN 37 (H) 8 - 23 mg/dL   Creatinine, Ser 4.14 (H) 0.44 - 1.00 mg/dL   Calcium 8.4 (L) 8.9 - 10.3 mg/dL   GFR, Estimated 11 (L) >60 mL/min    Comment: (NOTE) Calculated using the CKD-EPI Creatinine Equation (2021)    Anion gap 4 (L) 5 - 15    Comment: Performed at [facility], [address] [place], [place] [address]  Ferritin     Status: None   Collection Time: 06/17/21  5:14 AM  Result Value Ref Range   Ferritin 169 11 - 307 ng/mL    Comment: Performed at [facility], [address] [place], [place] [address]  Lipid Profile     Status: Abnormal   Collection Time: 07/01/21  3:37 PM  Result Value Ref Range   Cholesterol 217 (H) <200 mg/dL   HDL 75 > OR = 50 mg/dL   Triglycerides 83 <150 mg/dL   LDL Cholesterol (Calc) 124 (H) mg/dL (calc)    Comment: Reference range: <100 . Desirable range <100 mg/dL for primary prevention;   <70 mg/dL for patients with CHD or diabetic patients  with > or = 2 CHD risk factors. Marland Kitchen LDL-C is now  calculated using the Martin-Hopkins  calculation, which is a validated novel method providing  better accuracy than the Friedewald equation in the  estimation of LDL-C.  Cresenciano Genre et al. Annamaria Helling. 3545;625(63): 2061-2068  (http://education.[facility].com/faq/FAQ164)    Total CHOL/HDL Ratio 2.9 <5.0 (calc)  Non-HDL Cholesterol (Calc) 142 (H) <130 mg/dL (calc)    Comment: For patients with diabetes plus 1 major ASCVD risk  factor, treating to a non-HDL-C goal of <100 mg/dL  (LDL-C of <70 mg/dL) is considered a therapeutic  option.   TSH     Status: None   Collection Time: 07/01/21  3:37 PM  Result Value Ref Range   TSH 2.46 0.40 - 4.50 mIU/L  Lactate dehydrogenase     Status: None   Collection Time: 07/06/21 11:58 AM  Result Value Ref Range   LDH 190 98 - 192 U/L    Comment: Performed at [facility], [address] [place], [place] [address]  Retic Panel     Status: Abnormal   Collection Time: 07/06/21 11:58 AM  Result Value Ref Range   Retic Ct Pct 1.9 0.4 - 3.1 %   RBC. 2.82 (L) 3.87 - 5.11 MIL/uL   Retic Count, Absolute 54.4 19.0 - 186.0 K/uL   Immature Retic Fract 10.8 2.3 - 15.9 %   Reticulocyte Hemoglobin 34.0 >27.9 pg    Comment:        Given the high negative predictive value of a RET-He result > 32 pg iron deficiency is essentially excluded. If this patient is anemic other etiologies should be considered. Performed at [facility], [address] [place], [place] [address]   Kappa/lambda light chains     Status: Abnormal   Collection Time: 07/06/21 11:58 AM  Result Value Ref Range   Kappa free light chain 74.5 (H) 3.3 - 19.4 mg/L   Lambda free light chains 44.1 (H) 5.7 - 26.3 mg/L   Kappa, lambda light chain ratio 1.69 (H) 0.26 - 1.65    Comment: (NOTE) Performed At: [facility] [place], [place] [id number] Rush Farmer MD [id number]   Multiple Myeloma Panel (SPEP&IFE w/QIG)     Status: Abnormal   Collection  Time: 07/06/21 11:58 AM  Result Value Ref Range   IgG (Immunoglobin G), Serum 731 586 - 1,602 mg/dL   IgA 339 64 - 422 mg/dL   IgM (Immunoglobulin M), Srm 15 (L) 26 - 217 mg/dL    Comment: Result confirmed on concentration.   Total Protein ELP 6.0 6.0 - 8.5 g/dL   Albumin SerPl Elph-Mcnc 3.3 2.9 - 4.4 g/dL   Alpha 1 0.3 0.0 - 0.4 g/dL   Alpha2 Glob SerPl Elph-Mcnc 0.7 0.4 - 1.0 g/dL   B-Globulin SerPl Elph-Mcnc 1.1 0.7 - 1.3 g/dL   Gamma Glob SerPl Elph-Mcnc 0.6 0.4 - 1.8 g/dL   M Protein SerPl Elph-Mcnc Not Observed Not Observed g/dL   Globulin, Total 2.7 2.2 - 3.9 g/dL   Albumin/Glob SerPl 1.3 0.7 - 1.7   IFE 1 Comment     Comment: (NOTE) The immunofixation pattern appears unremarkable. Evidence of monoclonal protein is not apparent.    Please Note Comment     Comment: (NOTE) Protein electrophoresis scan will follow via computer, mail, or courier delivery. Performed At: [facility] [place], [place] [id number] Rush Farmer MD [id number]   Folate     Status: None   Collection Time: 07/06/21 11:58 AM  Result Value Ref Range   Folate 25.0 >5.9 ng/mL    Comment: Performed at [facility], [address] [place], [place] [address]  Vitamin B12     Status: None   Collection Time: 07/06/21 11:58 AM  Result Value Ref Range   Vitamin B-12 634 180 -  914 pg/mL    Comment: (NOTE) This assay is not validated for testing neonatal or myeloproliferative syndrome specimens for Vitamin B12 levels. Performed at [facility], [place] [address] [place], [place] [address]   Comprehensive metabolic panel     Status: Abnormal   Collection Time: 07/06/21 11:58 AM  Result Value Ref Range   Sodium 131 (L) 135 - 145 mmol/L   Potassium 3.9 3.5 - 5.1 mmol/L   Chloride 100 98 - 111 mmol/L   CO2 22 22 - 32 mmol/L   Glucose, Bld 103 (H) 70 - 99 mg/dL    Comment: Glucose reference range applies only to samples taken after fasting for at least 8 hours.    BUN 36 (H) 8 - 23 mg/dL   Creatinine, Ser 3.13 (H) 0.44 - 1.00 mg/dL   Calcium 9.1 8.9 - 10.3 mg/dL   Total Protein 6.5 6.5 - 8.1 g/dL   Albumin 3.7 3.5 - 5.0 g/dL   AST 21 15 - 41 U/L   ALT 12 0 - 44 U/L   Alkaline Phosphatase 64 38 - 126 U/L   Total Bilirubin 0.5 0.3 - 1.2 mg/dL   GFR, Estimated 15 (L) >60 mL/min    Comment: (NOTE) Calculated using the CKD-EPI Creatinine Equation (2021)    Anion gap 9 5 - 15    Comment: Performed at [facility], [address] [place], [place] [address]  CBC with Differential/Platelet     Status: Abnormal   Collection Time: 07/06/21 11:58 AM  Result Value Ref Range   WBC 5.2 4.0 - 10.5 K/uL   RBC 2.80 (L) 3.87 - 5.11 MIL/uL   Hemoglobin 8.5 (L) 12.0 - 15.0 g/dL   HCT 26.1 (L) 36.0 - 46.0 %   MCV 93.2 80.0 - 100.0 fL   MCH 30.4 26.0 - 34.0 pg   MCHC 32.6 30.0 - 36.0 g/dL   RDW 12.6 11.5 - 15.5 %   Platelets 213 150 - 400 K/uL   nRBC 0.0 0.0 - 0.2 %   Neutrophils Relative % 57 %   Neutro Abs 3.1 1.7 - 7.7 K/uL   Lymphocytes Relative 24 %   Lymphs Abs 1.2 0.7 - 4.0 K/uL   Monocytes Relative 9 %   Monocytes Absolute 0.5 0.1 - 1.0 K/uL   Eosinophils Relative 8 %   Eosinophils Absolute 0.4 0.0 - 0.5 K/uL   Basophils Relative 1 %   Basophils Absolute 0.0 0.0 - 0.1 K/uL   Immature Granulocytes 1 %   Abs Immature Granulocytes 0.03 0.00 - 0.07 K/uL    Comment: Performed at [facility], [place], [place] [address]  Iron and TIBC     Status: None   Collection Time: 07/06/21 11:58 AM  Result Value Ref Range   Iron 62 28 - 170 ug/dL   TIBC 319 250 - 450 ug/dL   Saturation Ratios 19 10.4 - 31.8 %   UIBC 257 ug/dL    Comment: Performed at [facility], [address] [place], [place] [address]  Ferritin     Status: None   Collection Time: 07/06/21 11:58 AM  Result Value Ref Range   Ferritin 112 11 - 307 ng/mL    Comment: Performed at [facility], [address] [place], [place] [address]     Radiology No results found.  Assessment/Plan  Renal artery stenosis (HCC) Status post right renal artery stent placement with improvement in blood pressure.  Renal function  appears to stabilize.  At this point, plan to see her back in 1 month with a renal duplex.  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  Blood pressure management as per primary care physician.  Hyperlipidemia lipid control important in reducing the progression of atherosclerotic disease. Continue statin therapy    Leotis Pain, MD  07/14/2021 9:46 AM    This note was created with Dragon medical transcription system.  Any errors from dictation are purely unintentional

## 2021-07-14 NOTE — Assessment & Plan Note (Signed)
lipid control important in reducing the progression of atherosclerotic disease. Continue statin therapy  

## 2021-07-15 ENCOUNTER — Encounter: Payer: Self-pay | Admitting: Oncology

## 2021-07-15 NOTE — Telephone Encounter (Signed)
Forward to team

## 2021-07-16 ENCOUNTER — Inpatient Hospital Stay: Payer: Medicare Other

## 2021-07-16 ENCOUNTER — Other Ambulatory Visit: Payer: Self-pay

## 2021-07-16 VITALS — BP 150/73 | HR 62 | Temp 97.0°F | Resp 17

## 2021-07-16 DIAGNOSIS — D638 Anemia in other chronic diseases classified elsewhere: Secondary | ICD-10-CM

## 2021-07-16 DIAGNOSIS — N184 Chronic kidney disease, stage 4 (severe): Secondary | ICD-10-CM | POA: Diagnosis not present

## 2021-07-16 MED ORDER — IRON SUCROSE 20 MG/ML IV SOLN
200.0000 mg | Freq: Once | INTRAVENOUS | Status: AC
  Administered 2021-07-16: 200 mg via INTRAVENOUS
  Filled 2021-07-16: qty 10

## 2021-07-16 MED ORDER — SODIUM CHLORIDE 0.9 % IV SOLN
Freq: Once | INTRAVENOUS | Status: AC
Start: 2021-07-16 — End: 2021-07-16
  Filled 2021-07-16: qty 250

## 2021-07-16 MED ORDER — SODIUM CHLORIDE 0.9 % IV SOLN: 200.0000 mg | Freq: Once | INTRAVENOUS | Status: DC

## 2021-07-16 NOTE — Patient Instructions (Signed)

## 2021-07-20 DIAGNOSIS — I129 Hypertensive chronic kidney disease with stage 1 through stage 4 chronic kidney disease, or unspecified chronic kidney disease: Secondary | ICD-10-CM

## 2021-07-20 DIAGNOSIS — E7801 Familial hypercholesterolemia: Secondary | ICD-10-CM | POA: Diagnosis not present

## 2021-07-20 DIAGNOSIS — I10 Essential (primary) hypertension: Secondary | ICD-10-CM

## 2021-07-20 DIAGNOSIS — I252 Old myocardial infarction: Secondary | ICD-10-CM

## 2021-07-20 DIAGNOSIS — E785 Hyperlipidemia, unspecified: Secondary | ICD-10-CM

## 2021-07-20 DIAGNOSIS — N183 Chronic kidney disease, stage 3 unspecified: Secondary | ICD-10-CM

## 2021-07-20 DIAGNOSIS — E890 Postprocedural hypothyroidism: Secondary | ICD-10-CM

## 2021-07-23 ENCOUNTER — Inpatient Hospital Stay: Payer: Medicare Other | Attending: Oncology

## 2021-07-23 ENCOUNTER — Other Ambulatory Visit: Payer: Self-pay

## 2021-07-23 VITALS — BP 124/66 | HR 63 | Temp 97.5°F | Resp 18

## 2021-07-23 DIAGNOSIS — D638 Anemia in other chronic diseases classified elsewhere: Secondary | ICD-10-CM

## 2021-07-23 DIAGNOSIS — D649 Anemia, unspecified: Secondary | ICD-10-CM | POA: Diagnosis present

## 2021-07-23 MED ORDER — SODIUM CHLORIDE 0.9 % IV SOLN
Freq: Once | INTRAVENOUS | Status: AC
  Filled 2021-07-23: qty 250

## 2021-07-23 MED ORDER — SODIUM CHLORIDE 0.9 % IV SOLN: 200.0000 mg | Freq: Once | INTRAVENOUS | Status: DC

## 2021-07-23 MED ORDER — IRON SUCROSE 20 MG/ML IV SOLN
200.0000 mg | Freq: Once | INTRAVENOUS | Status: AC
  Administered 2021-07-23: 200 mg via INTRAVENOUS
  Filled 2021-07-23: qty 10

## 2021-07-23 NOTE — Patient Instructions (Signed)
[facility]   Discharge Instructions: Thank you for choosing [facility] to provide your oncology and hematology care.  If you have a lab appointment with the Cancer Center, please go directly to the Cancer Center and check in at the registration area.  Wear comfortable clothing and clothing appropriate for easy access to any Portacath or PICC line.   We strive to give you quality time with your provider. You may need to reschedule your appointment if you arrive late (15 or more minutes).  Arriving late affects you and other patients whose appointments are after yours.  Also, if you miss three or more appointments without notifying the office, you may be dismissed from the clinic at the provider's discretion.      For prescription refill requests, have your pharmacy contact our office and allow 72 hours for refills to be completed.    BELOW ARE SYMPTOMS THAT SHOULD BE REPORTED IMMEDIATELY: *FEVER GREATER THAN 100.4 F (38 C) OR HIGHER *CHILLS OR SWEATING *NAUSEA AND VOMITING THAT IS NOT CONTROLLED WITH YOUR NAUSEA MEDICATION *UNUSUAL SHORTNESS OF BREATH *UNUSUAL BRUISING OR BLEEDING *URINARY PROBLEMS (pain or burning when urinating, or frequent urination) *BOWEL PROBLEMS (unusual diarrhea, constipation, pain near the anus) TENDERNESS IN MOUTH AND THROAT WITH OR WITHOUT PRESENCE OF ULCERS (sore throat, sores in mouth, or a toothache) UNUSUAL RASH, SWELLING OR PAIN  UNUSUAL VAGINAL DISCHARGE OR ITCHING   Items with * indicate a potential emergency and should be followed up as soon as possible or go to the Emergency Department if any problems should occur.  Please show the CHEMOTHERAPY ALERT CARD or IMMUNOTHERAPY ALERT CARD at check-in to the Emergency Department and triage nurse.  Should you have questions after your visit or need to cancel or reschedule your appointment, please contact [facility]   [phone] and follow the prompts.  Office hours are 8:00 a.m. to 4:30 p.m. Monday - Friday. Please note that voicemails left after 4:00 p.m. may not be returned until the following business day.  We are closed weekends and major holidays. You have access to a nurse at all times for urgent questions. Please call the main number to the clinic [phone] and follow the prompts.  For any non-urgent questions, you may also contact your provider using MyChart. We now offer e-Visits for anyone 18 and older to request care online for non-urgent symptoms. For details visit [email or web address].   Also download the MyChart app! Go to the app store, search "MyChart", open the app, select [facility], and log in with your MyChart username and password.  Due to Covid, a mask is required upon entering the hospital/clinic. If you do not have a mask, one will be given to you upon arrival. For doctor visits, patients may have 1 support person aged 18 or older with them. For treatment visits, patients cannot have anyone with them due to current Covid guidelines and our immunocompromised population.   Iron Sucrose Injection What is this medication? IRON SUCROSE (EYE ern SOO krose) treats low levels of iron (iron deficiency anemia) in people with kidney disease. Iron is a mineral that plays an important role in making red blood cells, which carry oxygen from your lungs to the rest of your body. This medicine may be used for other purposes; ask your health care provider or pharmacist if you have questions. COMMON BRAND NAME(S): Venofer What should I tell my care team before I take this medication? They need   to know if you have any of these conditions: Anemia not caused by low iron levels Heart disease High levels of iron in the blood Kidney disease Liver disease An unusual or allergic reaction to iron, other medications, foods, dyes, or preservatives Pregnant or trying to get pregnant Breast-feeding How  should I use this medication? This medication is for infusion into a vein. It is given in a hospital or clinic setting. Talk to your care team about the use of this medication in children. While this medication may be prescribed for children as young as 2 years for selected conditions, precautions do apply. Overdosage: If you think you have taken too much of this medicine contact a poison control center or emergency room at once. NOTE: This medicine is only for you. Do not share this medicine with others. What if I miss a dose? It is important not to miss your dose. Call your care team if you are unable to keep an appointment. What may interact with this medication? Do not take this medication with any of the following: Deferoxamine Dimercaprol Other iron products This medication may also interact with the following: Chloramphenicol Deferasirox This list may not describe all possible interactions. Give your health care provider a list of all the medicines, herbs, non-prescription drugs, or dietary supplements you use. Also tell them if you smoke, drink alcohol, or use illegal drugs. Some items may interact with your medicine. What should I watch for while using this medication? Visit your care team regularly. Tell your care team if your symptoms do not start to get better or if they get worse. You may need blood work done while you are taking this medication. You may need to follow a special diet. Talk to your care team. Foods that contain iron include: whole grains/cereals, dried fruits, beans, or peas, leafy green vegetables, and organ meats (liver, kidney). What side effects may I notice from receiving this medication? Side effects that you should report to your care team as soon as possible: Allergic reactions-skin rash, itching, hives, swelling of the face, lips, tongue, or throat Low blood pressure-dizziness, feeling faint or lightheaded, blurry vision Shortness of breath Side effects  that usually do not require medical attention (report to your care team if they continue or are bothersome): Flushing Headache Joint pain Muscle pain Nausea Pain, redness, or irritation at injection site This list may not describe all possible side effects. Call your doctor for medical advice about side effects. You may report side effects to FDA at 1-800-FDA-1088. Where should I keep my medication? This medication is given in a hospital or clinic and will not be stored at home. NOTE: This sheet is a summary. It may not cover all possible information. If you have questions about this medicine, talk to your doctor, pharmacist, or health care provider.  2022 Elsevier/Gold Standard (2020-12-02 12:52:06)  

## 2021-07-23 NOTE — Progress Notes (Signed)
Pt received IV venofer infusion in clinic today. VSS @ d/c.

## 2021-07-27 NOTE — Patient Instructions (Signed)
Visit Information It was great speaking with you today!  Please let me know if you have any questions about our visit.   Goals Addressed             This Visit's Progress    Track and Manage My Blood Pressure-Hypertension       Timeframe:  Long-Range Goal Priority:  High Start Date: 07/27/2021                             Expected End Date: 07/27/2022                      Follow Up within 90 days   - check blood pressure 3 times per week    Why is this important?   You won't feel high blood pressure, but it can still hurt your blood vessels.  High blood pressure can cause heart or kidney problems. It can also cause a stroke.  Making lifestyle changes like losing a little weight or eating less salt will help.  Checking your blood pressure at home and at different times of the day can help to control blood pressure.  If the doctor prescribes medicine remember to take it the way the doctor ordered.  Call the office if you cannot afford the medicine or if there are questions about it.     Notes:         Patient Care Plan: Fall Risk (Adult)     Problem Identified: Fall Risk      Long-Range Goal: Absence of Fall and Fall-Related Injury   Start Date: 07/02/2021  Expected End Date: 09/30/2021  Priority: Medium  Note:   Current Barriers:  Risk for Falls d/t Impaired Balance and Hx of CVA  Clinical Goal(s):  Over the next 90 days, patient will not experience falls or require hospitalization for fall related injuries.  Interventions:  Collaboration with Steele Sizer, MD regarding development and update of comprehensive plan of care as evidenced by provider attestation and co-signature Inter-disciplinary care team collaboration (see longitudinal plan of care) Reviewed medications and discussed potential side effects such as dizziness, lightheadedness and frequent urination. Provided information regarding safety and fall prevention. Reports using a cane when ambulating.  Reports increased episodes of fatigue and decreased activity tolerance which she attributes to iron deficiency. She will follow up with her Hematologist on 07/06/21.  Discussed ability to perform ADL's and tasks in the home. Reports completing ADL's independently. Reports good support from family and neighbors. Declined need for additional assistance. Agreed to update the care management team if this changes and additional services are required.  Self-Care Deficits/Patient Goals:  Utilize assistive device appropriately with all ambulation Ensure pathways are clear and well lit Change positions slowly and use caution when ambulating Wear secure fitting, skid free footwear when ambulating Notify provider or care management team for questions and new concerns as needed   Follow Up Plan:  Will follow up next month    Patient Care Plan: Hypertension and Dyslipidemia     Problem Identified: Hypertension and Dyslipidemia      Long-Range Goal: Disease Progression Prevented or Minimized   Start Date: 07/02/2021  Expected End Date: 09/30/2021  Priority: High  Note:   Objective:  Last practice recorded BP readings:  BP Readings from Last 3 Encounters:  07/01/21 134/86  06/19/21 132/84  06/17/21 (!) 178/83   Most recent eGFR/CrCl: No results found for: EGFR  No  components found for: CRCL  Lab Results  Component Value Date   CHOL 217 (H) 07/01/2021   HDL 75 07/01/2021   LDLCALC 124 (H) 07/01/2021   TRIG 83 07/01/2021   CHOLHDL 2.9 07/01/2021     Current Barriers:  Chronic Disease Management support and educational needs related to Hypertension and Dyslipidemia.  Case Manager Clinical Goal(s):  Over the next 90 days, patient will demonstrate improved adherence to prescribed treatment plan as evidenced by taking all medications as prescribed, monitoring and recording blood pressure and adhering to a low sodium/DASH diet.  Interventions:  Collaboration with Steele Sizer, MD  regarding development and update of comprehensive plan of care as evidenced by provider attestation and co-signature Inter-disciplinary care team collaboration (see longitudinal plan of care) Reviewed medications.  Discussed importance of compliance. Advised to continue taking medications as prescribed and notify provider if unable to tolerate regimen. Voiced concerns regarding prescription cost. She will discuss with the CCM Pharmacist on 07/08/21. Provided information regarding established blood pressure parameters along with indications for notifying a provider. Reports not monitoring consistently. Encouraged to monitor and record readings.  Discussed compliance with recommended cardiac prudent diet. Encouraged to read nutrition labels, monitor sodium intake and avoid highly processed foods when possible. Discussed complications of uncontrolled blood pressure. Reviewed s/sx of heart attack, stroke and worsening symptoms that require immediate medical attention.   Patient Goals/Self-Care Activities: Self-administer medications as prescribed Attend medical appointments as scheduled Complete outreach with the CCM Pharmacist Monitor and record blood pressure Adhere to recommended cardiac prudent/heart healthy diet Notify provider or care management team with questions and new concerns as needed    Follow Up Plan:  Will follow up next month    Patient Care Plan: General Pharmacy (Adult)     Problem Identified: Hypertension, Hyperlipidemia, GERD, Chronic Kidney Disease, Hypothyroidism, and History of Stroke, NSTEMI   Priority: High     Long-Range Goal: Patient-Specific Goal   Start Date: 07/27/2021  Expected End Date: 07/27/2022  This Visit's Progress: On track  Priority: High  Note:   Current Barriers:  Unable to achieve control of cholesterol  Suboptimal therapeutic regimen for blood pressure  Pharmacist Clinical Goal(s):  Patient will achieve control of cholesterol as evidenced  by LDL less than 70 through collaboration with PharmD and provider.   Interventions: 1:1 collaboration with Steele Sizer, MD regarding development and update of comprehensive plan of care as evidenced by provider attestation and co-signature Inter-disciplinary care team collaboration (see longitudinal plan of care) Comprehensive medication review performed; medication list updated in electronic medical record  Hypertension (BP goal <140/90) -Not ideally controlled -Current treatment: Clonidine 0.90m patch every 7 days  HCTZ 12.5 mg daily  Labetalol 200 mg twice daily  Nifedipine XL 60 mg daily  -Medications previously tried: Irbesartan, Hydralazine,   -Patient reports significant dizziness ~30 minutes after taking labetalol -Reports hypotensive/hypertensive symptoms -Recommended to continue current medication  Hyperlipidemia: (LDL goal < 70) -Uncontrolled -Current treatment: Atorvastatin 40 mg daily  Ezetimibe 10 mg daily  -Medications previously tried: NA  -Recommended to continue current medication  Hypothyroidism (Goal: maintain thyroid function) -Controlled -Current treatment  Levothyroxine 75 mcg daily  -Medications previously tried: NA  -Recommended to continue current medication  Chronic Kidney Disease Stage 4  -All medications assessed for renal dosing and appropriateness in chronic kidney disease. -Recommend stopping HCTZ due to ineffective in CKD stage 4.  -Recommended to continue current medication   Patient Goals/Self-Care Activities Patient will:  - check blood pressure 3 times  weekly, document, and provide at future appointments  Follow Up Plan: Telephone follow up appointment with care management team member scheduled for:  10/07/2021 at 3:00 PM    Ms. Lanum was given information about Chronic Care Management services today including:  CCM service includes personalized support from designated clinical staff supervised by her physician, including  individualized plan of care and coordination with other care providers 24/7 contact phone numbers for assistance for urgent and routine care needs. Standard insurance, coinsurance, copays and deductibles apply for chronic care management only during months in which we provide at least 20 minutes of these services. Most insurances cover these services at 100%, however patients may be responsible for any copay, coinsurance and/or deductible if applicable. This service may help you avoid the need for more expensive face-to-face services. Only one practitioner may furnish and bill the service in a calendar month. The patient may stop CCM services at any time (effective at the end of the month) by phone call to the office staff.  Patient agreed to services and verbal consent obtained.   The patient verbalized understanding of instructions, educational materials, and care plan provided today and declined offer to receive copy of patient instructions, educational materials, and care plan.   Malva Limes, [facility] [phone]

## 2021-07-29 ENCOUNTER — Ambulatory Visit (INDEPENDENT_AMBULATORY_CARE_PROVIDER_SITE_OTHER): Payer: Medicare Other

## 2021-07-29 ENCOUNTER — Telehealth: Payer: Medicare Other

## 2021-07-29 ENCOUNTER — Telehealth: Payer: Self-pay

## 2021-07-29 DIAGNOSIS — I10 Essential (primary) hypertension: Secondary | ICD-10-CM

## 2021-07-29 DIAGNOSIS — Z9181 History of falling: Secondary | ICD-10-CM

## 2021-07-29 DIAGNOSIS — E785 Hyperlipidemia, unspecified: Secondary | ICD-10-CM

## 2021-07-29 NOTE — Patient Instructions (Addendum)
Thank you for allowing the Chronic Care Management team to participate in your care.    Patient Care Plan: Fall Risk (Adult)     Problem Identified: Fall Risk      Long-Range Goal: Absence of Fall and Fall-Related Injury   Start Date: 07/02/2021  Expected End Date: 09/30/2021  Priority: Medium  Note:   Current Barriers:  Risk for Falls d/t Impaired Balance and Hx of CVA  Clinical Goal(s):  Over the next 90 days, patient will not experience falls or require hospitalization for fall related injuries.  Interventions:  Collaboration with Steele Sizer, MD regarding development and update of comprehensive plan of care as evidenced by provider attestation and co-signature Inter-disciplinary care team collaboration (see longitudinal plan of care) Reviewed safety and fall prevention. Continues to utilize a cane when ambulating. No recent falls. Advised to continue utilizing safety and fall prevention measures in the home. Declined current need for additional resources. Advised to update the care management team if her functional status/ability to ambulate declines and additional assistance is needed.  Self-Care Deficits/Patient Goals:  Utilize assistive device appropriately with all ambulation Ensure pathways are clear and well lit Change positions slowly and use caution when ambulating Wear secure fitting, skid free footwear when ambulating Notify provider or care management team for questions and new concerns as needed     Patient Care Plan: Hypertension and Dyslipidemia     Problem Identified: Hypertension and Dyslipidemia      Long-Range Goal: Disease Progression Prevented or Minimized   Start Date: 07/02/2021  Expected End Date: 09/30/2021  Priority: High  Note:   Objective:  BP Readings from Last 3 Encounters:  07/23/21 124/66  07/16/21 (!) 150/73  07/14/21 (!) 176/77     Current Barriers:  Chronic Disease Management support and educational needs related to  Hypertension and Dyslipidemia.  Case Manager Clinical Goal(s):  Over the next 90 days, patient will demonstrate improved adherence to prescribed treatment plan as evidenced by taking all medications as prescribed, monitoring and recording blood pressure and adhering to a low sodium/DASH diet.  Interventions:  Collaboration with Steele Sizer, MD regarding development and update of comprehensive plan of care as evidenced by provider attestation and co-signature Inter-disciplinary care team collaboration (see longitudinal plan of care) Reviewed medications and compliance with treatment plan. Reports taking medications as prescribed. Requested clarification regarding need to continue taking HCTZ. Collaborated with PCP and CCM Pharmacy. Per team, no order on file to discontinue use. Advised to continue taking as prescribed unless instructed to discontinue by Cardiologist, Nephrologist or PCP. Reviewed BP readings and established parameters. Reports home systolic readings over the past few weeks have ranged in the 150's and 160's. Reports diastolic readings have remained stable in the 70's. Denies episodes of chest discomfort, palpitations, lightheadedness or dizziness. Thoroughly reviewed indications for notifying a provider for elevated readings. Reviewed nutritional intake and activity. Reports monitoring sodium intake as advised. Her activity level remains limited d/t impaired balance. Advised to continue reading nutrition labels and avoiding highly processed foods when possible. Discussed complications of uncontrolled blood pressure.  Reviewed s/sx of heart attack, stroke and worsening symptoms that require immediate medical attention.   Patient Goals/Self-Care Activities: Self-administer medications as prescribed Attend medical appointments as scheduled Complete outreach with the CCM Pharmacist Monitor and record blood pressure Adhere to recommended cardiac prudent/heart healthy diet Notify  provider or care management team with questions and new concerns as needed       Ms. Ziff verbalized understanding of the information  discussed during the telephonic outreach. Declined need for mailed/printed instructions. A member of the care management team will follow up next month.   [facility] [phone]

## 2021-07-29 NOTE — Chronic Care Management (AMB) (Signed)
Chronic Care Management   CCM RN Visit Note  07/29/2021 Name: Betty Nielsen MRN: [id number] DOB: [birth year]  Subjective: Betty Nielsen is a 75 y.o. year old female who is a primary care patient of Steele Sizer, MD. The care management team was consulted for assistance with disease management and care coordination needs.    Engaged with patient by telephone for follow up visit in response to provider referral for case management and care coordination services.   Consent to Services:  The patient was given information about Chronic Care Management services, agreed to services, and gave verbal consent prior to initiation of services.  Please see initial visit note for detailed documentation.   Assessment: Review of patient past medical history, allergies, medications, health status, including review of consultants reports, laboratory and other test data, was performed as part of comprehensive evaluation and provision of chronic care management services.   SDOH (Social Determinants of Health) assessments and interventions performed: No  CCM Care Plan  Allergies  Allergen Reactions   Ace Inhibitors Cough   Ciprofloxacin Other (See Comments)    unknown   Citalopram     hyponatremia   Hydrocodone-Acetaminophen Other (See Comments)    unknown   Norvasc [Amlodipine Besylate]     Pruritus    Pantoprazole Sodium Other (See Comments)    Cramps   Pravastatin Other (See Comments)    Stomach cramps   Sulfa Antibiotics     unknown   Clonidine Other (See Comments)    Per patient told by allergist that would be best to stop but nephrologist recommended her to stay on medication     Outpatient Encounter Medications as of 07/29/2021  Medication Sig   acetaminophen (TYLENOL) 650 MG CR tablet Take 650 mg by mouth every 8 (eight) hours as needed for pain.   aspirin EC 81 MG tablet Take 81 mg by mouth daily. Swallow whole.   atorvastatin (LIPITOR) 40 MG tablet Take 1 tablet (40 mg total)  by mouth daily at 6 PM. Cholesterol medication.   cetirizine (ZYRTEC) 10 MG tablet Take 1 tablet (10 mg total) by mouth daily as needed. PRN only   Cholecalciferol 125 MCG (5000 UT) TABS Take 5,000 Units by mouth daily. (Patient not taking: Reported on 07/14/2021)   cloNIDine (CATAPRES - DOSED IN MG/24 HR) 0.2 mg/24hr patch Place 1 patch (0.2 mg total) onto the skin every 7 (seven) days.   clopidogrel (PLAVIX) 75 MG tablet TAKE 1 TABLET BY MOUTH ONCE DAILY. BLOOD THINNER TO HELP REDUCE STROKE RISK   ezetimibe (ZETIA) 10 MG tablet Take 1 tablet (10 mg total) by mouth daily.   hydrochlorothiazide (MICROZIDE) 12.5 MG capsule Take 1 capsule (12.5 mg total) by mouth daily.   labetalol (NORMODYNE) 200 MG tablet Take 1 tablet (200 mg total) by mouth 2 (two) times daily.   levothyroxine (SYNTHROID) 75 MCG tablet Take 1 tablet (75 mcg total) by mouth daily before breakfast.   NIFEdipine (PROCARDIA XL/NIFEDICAL XL) 60 MG 24 hr tablet Take 60 mg by mouth daily.   nitroGLYCERIN (NITROSTAT) 0.4 MG SL tablet Place under the tongue.   Omega-3 Fatty Acids (FISH OIL) 1000 MG CPDR Take 1 capsule by mouth daily.   patiromer (VELTASSA) 8.4 g packet Take 1 packet (8.4 g total) by mouth daily.   No facility-administered encounter medications on file as of 07/29/2021.    Patient Active Problem List   Diagnosis Date Noted   Renal artery stenosis ([place]) 07/14/2021   Acute kidney injury  superimposed on CKD (HCC)    Hyperkalemia    Hypothyroidism    Accelerated hypertension 06/09/2021   Left upper extremity numbness 06/07/2021   Left facial numbness 06/07/2021   Elevated serum creatinine 06/07/2021   CVA (cerebral vascular accident) ([place]) 06/06/2021   Benign hypertension with chronic kidney disease, stage III ([place]) 06/18/2020   Anemia of chronic disease 11/21/2019   Stage 3b chronic kidney disease ([place]) 11/21/2019   Hypo-osmolality and hyponatremia 11/21/2019   Secondary hyperparathyroidism of renal origin ([place])  11/21/2019   History of stroke 10/27/2019   PMR (polymyalgia rheumatica) ([place]) 06/15/2017   Headache 10/27/2016   CKD (chronic kidney disease), stage III ([place]) 10/27/2016   Hyperlipidemia 10/27/2016   Hyponatremia 10/27/2016   Angina pectoris ([place]) 10/25/2016   History of non-ST elevation myocardial infarction (NSTEMI) 10/25/2016   History of coronary angioplasty 10/25/2016   BPV (benign positional vertigo) 06/15/2016   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[place]) 11/21/2015   Acid reflux 06/05/2015   Benign secondary hypertension due to renal artery stenosis ([place]) 06/05/2015   Hypothyroidism, 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   Major depression in remission ([place]) 06/05/2015   Obesity (BMI 30.0-34.9) 06/05/2015   Neuropathy 06/05/2015   Perennial allergic rhinitis with seasonal variation 06/05/2015   Arthritis, degenerative 07/03/2014   AAA (abdominal aortic aneurysm) without rupture 06/26/2014    Conditions to be addressed/monitored:HTN, HLD, and Falls Patient Care Plan: Fall Risk (Adult)     Problem Identified: Fall Risk      Long-Range Goal: Absence of Fall and Fall-Related Injury   Start Date: 07/02/2021  Expected End Date: 09/30/2021  Priority: Medium  Note:   Current Barriers:  Risk for Falls d/t Impaired Balance and Hx of CVA  Clinical Goal(s):  Over the next 90 days, patient will not experience falls or require hospitalization for fall related injuries.  Interventions:  Collaboration with Steele Sizer, MD regarding development and update of comprehensive plan of care as evidenced by provider attestation and co-signature Inter-disciplinary care team collaboration (see longitudinal plan of care) Reviewed safety and fall prevention. Continues to utilize a cane when ambulating. No recent falls. Advised to continue utilizing safety and fall prevention measures in the home. Declined current need for additional resources. Advised to update the care management team if her functional  status/ability to ambulate declines and additional assistance is needed.  Self-Care Deficits/Patient Goals:  Utilize assistive device appropriately with all ambulation Ensure pathways are clear and well lit Change positions slowly and use caution when ambulating Wear secure fitting, skid free footwear when ambulating Notify provider or care management team for questions and new concerns as needed     Patient Care Plan: Hypertension and Dyslipidemia     Problem Identified: Hypertension and Dyslipidemia      Long-Range Goal: Disease Progression Prevented or Minimized   Start Date: 07/02/2021  Expected End Date: 09/30/2021  Priority: High  Note:   Objective:  BP Readings from Last 3 Encounters:  07/23/21 124/66  07/16/21 (!) 150/73  07/14/21 (!) 176/77     Current Barriers:  Chronic Disease Management support and educational needs related to Hypertension and Dyslipidemia.  Case Manager Clinical Goal(s):  Over the next 90 days, patient will demonstrate improved adherence to prescribed treatment plan as evidenced by taking all medications as prescribed, monitoring and recording blood pressure and adhering to a low sodium/DASH diet.  Interventions:  Collaboration with Steele Sizer, MD regarding development and update of comprehensive plan of care as evidenced by provider attestation and co-signature Inter-disciplinary care team  collaboration (see longitudinal plan of care) Reviewed medications and compliance with treatment plan. Reports taking medications as prescribed. Requested clarification regarding need to continue taking HCTZ. Collaborated with PCP and [facility] Pharmacy. Per team, no order on file to discontinue use. Advised to continue taking as prescribed unless instructed to discontinue by Cardiologist, Nephrologist or PCP. Reviewed BP readings and established parameters. Reports home systolic readings over the past few weeks have ranged in the 150's and 160's. Reports  diastolic readings have remained stable in the 70's. Denies episodes of chest discomfort, palpitations, lightheadedness or dizziness. Thoroughly reviewed indications for notifying a provider for elevated readings. Reviewed nutritional intake and activity. Reports monitoring sodium intake as advised. Her activity level remains limited d/t impaired balance. Advised to continue reading nutrition labels and avoiding highly processed foods when possible. Discussed complications of uncontrolled blood pressure.  Reviewed s/sx of heart attack, stroke and worsening symptoms that require immediate medical attention.   Patient Goals/Self-Care Activities: Self-administer medications as prescribed Attend medical appointments as scheduled Complete outreach with the CCM Pharmacist Monitor and record blood pressure Adhere to recommended cardiac prudent/heart healthy diet Notify provider or care management team with questions and new concerns as needed      PLAN A member of the care management team will follow up next month.   [facility] Care Management [facility] [phone]

## 2021-07-29 NOTE — Telephone Encounter (Signed)
  Care Management   Follow Up Note   07/29/2021 Name: Betty Nielsen MRN: [id number] DOB: 14-Jul-1946   Primary Care Provider: Steele Sizer, MD Reason for referral : Chronic Care Management   An unsuccessful telephone outreach was attempted today. The patient was referred to the case management team for assistance with care management and care coordination.    Follow Up Plan:  A HIPAA compliant voice message was left today requesting a return call.   [facility] Care Management [facility] [phone]

## 2021-07-30 ENCOUNTER — Other Ambulatory Visit: Payer: Self-pay

## 2021-07-30 ENCOUNTER — Inpatient Hospital Stay: Payer: Medicare Other

## 2021-07-30 VITALS — BP 128/67 | HR 63 | Temp 98.0°F | Resp 20

## 2021-07-30 DIAGNOSIS — D638 Anemia in other chronic diseases classified elsewhere: Secondary | ICD-10-CM

## 2021-07-30 DIAGNOSIS — D649 Anemia, unspecified: Secondary | ICD-10-CM | POA: Diagnosis not present

## 2021-07-30 MED ORDER — SODIUM CHLORIDE 0.9 % IV SOLN: 200.0000 mg | Freq: Once | INTRAVENOUS | Status: DC

## 2021-07-30 MED ORDER — SODIUM CHLORIDE 0.9 % IV SOLN
Freq: Once | INTRAVENOUS | Status: AC
  Filled 2021-07-30: qty 250

## 2021-07-30 MED ORDER — IRON SUCROSE 20 MG/ML IV SOLN
200.0000 mg | Freq: Once | INTRAVENOUS | Status: AC
  Administered 2021-07-30: 200 mg via INTRAVENOUS
  Filled 2021-07-30: qty 10

## 2021-07-30 NOTE — Patient Instructions (Signed)

## 2021-08-05 ENCOUNTER — Telehealth: Payer: Self-pay

## 2021-08-05 NOTE — Telephone Encounter (Signed)
Called to see if patient wanted [place] to mail form or if she would like to pick up. Awaiting return call.

## 2021-08-05 NOTE — Telephone Encounter (Signed)
Completed and placed in the outgoing mail for delivery.

## 2021-08-05 NOTE — Telephone Encounter (Signed)
Pt would like to know if a handicap form can be filled out for her. Please advise

## 2021-08-05 NOTE — Telephone Encounter (Signed)
Patient called back and stated she would like form mailed to her.

## 2021-08-19 DIAGNOSIS — E785 Hyperlipidemia, unspecified: Secondary | ICD-10-CM

## 2021-08-19 DIAGNOSIS — I10 Essential (primary) hypertension: Secondary | ICD-10-CM

## 2021-08-25 ENCOUNTER — Ambulatory Visit (INDEPENDENT_AMBULATORY_CARE_PROVIDER_SITE_OTHER): Payer: Medicare Other

## 2021-08-25 ENCOUNTER — Ambulatory Visit (INDEPENDENT_AMBULATORY_CARE_PROVIDER_SITE_OTHER): Payer: Medicare Other | Admitting: Vascular Surgery

## 2021-08-25 ENCOUNTER — Other Ambulatory Visit: Payer: Self-pay

## 2021-08-25 ENCOUNTER — Encounter (INDEPENDENT_AMBULATORY_CARE_PROVIDER_SITE_OTHER): Payer: Self-pay | Admitting: Vascular Surgery

## 2021-08-25 VITALS — BP 203/83 | HR 68 | Ht 66.0 in | Wt 176.0 lb

## 2021-08-25 DIAGNOSIS — I150 Renovascular hypertension: Secondary | ICD-10-CM | POA: Diagnosis not present

## 2021-08-25 DIAGNOSIS — Z0000 Encounter for general adult medical examination without abnormal findings: Secondary | ICD-10-CM | POA: Diagnosis not present

## 2021-08-25 DIAGNOSIS — I701 Atherosclerosis of renal artery: Secondary | ICD-10-CM

## 2021-08-25 DIAGNOSIS — E7801 Familial hypercholesterolemia: Secondary | ICD-10-CM

## 2021-08-25 DIAGNOSIS — I714 Abdominal aortic aneurysm, without rupture, unspecified: Secondary | ICD-10-CM | POA: Diagnosis not present

## 2021-08-25 NOTE — Progress Notes (Signed)
MRN : [id number]  Betty Nielsen is a 75 y.o. ([birth year]) female who presents with chief complaint of  Chief Complaint  Patient presents with   Follow-up    1 mo renal   .  History of Present Illness: Patient returns today in follow up of her renal artery stenosis and severe hypertension.  She says her blood pressures been doing much better since she had her renal artery stent placed almost 2 months ago.  She still runs high, in the 830N and 407W systolic and in the 80S diastolic.  Prior to her procedure she was consistently running well over 811 systolic.  She also says that her kidney function has been stable as far she knows.  Duplex today shows her right renal artery stent to be widely patent with an occlusion of the left renal artery.  Current Outpatient Medications  Medication Sig Dispense Refill   acetaminophen (TYLENOL) 650 MG CR tablet Take 650 mg by mouth every 8 (eight) hours as needed for pain.     aspirin EC 81 MG tablet Take 81 mg by mouth daily. Swallow whole.     atorvastatin (LIPITOR) 40 MG tablet Take 1 tablet (40 mg total) by mouth daily at 6 PM. Cholesterol medication. 90 tablet 1   cetirizine (ZYRTEC) 10 MG tablet Take 1 tablet (10 mg total) by mouth daily as needed. PRN only     Cholecalciferol 125 MCG (5000 UT) TABS Take 5,000 Units by mouth daily.     clopidogrel (PLAVIX) 75 MG tablet TAKE 1 TABLET BY MOUTH ONCE DAILY. BLOOD THINNER TO HELP REDUCE STROKE RISK 90 tablet 1   ezetimibe (ZETIA) 10 MG tablet Take 1 tablet (10 mg total) by mouth daily. 90 tablet 1   hydrochlorothiazide (MICROZIDE) 12.5 MG capsule Take 1 capsule (12.5 mg total) by mouth daily. 30 capsule 0   levothyroxine (SYNTHROID) 75 MCG tablet Take 1 tablet (75 mcg total) by mouth daily before breakfast. 90 tablet 1   NIFEdipine (PROCARDIA XL/NIFEDICAL XL) 60 MG 24 hr tablet Take 60 mg by mouth daily.     nitroGLYCERIN (NITROSTAT) 0.4 MG SL tablet Place under the tongue.     Omega-3 Fatty Acids (FISH  OIL) 1000 MG CPDR Take 1 capsule by mouth daily.     patiromer (VELTASSA) 8.4 g packet Take 1 packet (8.4 g total) by mouth daily. 30 each 0   cloNIDine (CATAPRES - DOSED IN MG/24 HR) 0.2 mg/24hr patch Place 1 patch (0.2 mg total) onto the skin every 7 (seven) days. 4 patch 0   labetalol (NORMODYNE) 200 MG tablet Take 1 tablet (200 mg total) by mouth 2 (two) times daily. 60 tablet 0   No current facility-administered medications for this visit.    Past Medical History:  Diagnosis Date   Arthrosis of knee    Chronic kidney disease    Hyperlipidemia    Hypertension    Loss of hearing    Metabolic syndrome    Plantar fasciitis    Renal insufficiency     Past Surgical History:  Procedure Laterality Date   ABDOMINAL AORTIC ANEURYSM REPAIR     ABDOMINAL HYSTERECTOMY     APPENDECTOMY     CORONARY STENT INTERVENTION N/A 10/26/2016   Procedure: Coronary Stent Intervention;  Surgeon: Yolonda Kida, MD;  Location: [facility];  Service: Cardiovascular;  Laterality: N/A;   HERNIA REPAIR     INNER EAR SURGERY     pt not sure of type  LEFT HEART CATH AND CORONARY ANGIOGRAPHY N/A 10/26/2016   Procedure: Left Heart Cath and Coronary Angiography;  Surgeon: Teodoro Spray, MD;  Location: [facility];  Service: Cardiovascular;  Laterality: N/A;   RENAL ANGIOGRAPHY N/A 06/11/2021   Procedure: RENAL ANGIOGRAPHY;  Surgeon: Algernon Huxley, MD;  Location: [facility];  Service: Cardiovascular;  Laterality: N/A;   THYROIDECTOMY       Social History   Tobacco Use   Smoking status: Former    Packs/day: 0.50    Years: 20.00    Pack years: 10.00    Types: Cigarettes    Quit date: 2000    Years since quitting: 22.9   Smokeless tobacco: Never   Tobacco comments:    smoking cessation materials not required  Vaping Use   Vaping Use: Never used  Substance Use Topics   Alcohol use: No    Alcohol/week: 0.0 standard drinks   Drug use: No       Family History  Problem  Relation Age of Onset   Healthy Mother    Cerebral aneurysm Father      Allergies  Allergen Reactions   Ace Inhibitors Cough   Ciprofloxacin Other (See Comments)    unknown   Citalopram     hyponatremia   Hydrocodone-Acetaminophen Other (See Comments)    unknown   Norvasc [Amlodipine Besylate]     Pruritus    Pantoprazole Sodium Other (See Comments)    Cramps   Pravastatin Other (See Comments)    Stomach cramps   Sulfa Antibiotics     unknown   Clonidine Other (See Comments)    Per patient told by allergist that would be best to stop but nephrologist recommended her to stay on medication      REVIEW OF SYSTEMS (Negative unless checked)  Constitutional: [] Weight loss  [] Fever  [] Chills Cardiac: [] Chest pain   [] Chest pressure   [x] Palpitations   [] Shortness of breath when laying flat   [] Shortness of breath at rest   [x] Shortness of breath with exertion. Vascular:  [] Pain in legs with walking   [] Pain in legs at rest   [] Pain in legs when laying flat   [] Claudication   [] Pain in feet when walking  [] Pain in feet at rest  [] Pain in feet when laying flat   [] History of DVT   [] Phlebitis   [] Swelling in legs   [] Varicose veins   [] Non-healing ulcers Pulmonary:   [] Uses home oxygen   [] Productive cough   [] Hemoptysis   [] Wheeze  [] COPD   [] Asthma Neurologic:  [] Dizziness  [] Blackouts   [] Seizures   [] History of stroke   [] History of TIA  [] Aphasia   [] Temporary blindness   [] Dysphagia   [] Weakness or numbness in arms   [] Weakness or numbness in legs Musculoskeletal:  [x] Arthritis   [] Joint swelling   [x] Joint pain   [] Low back pain Hematologic:  [] Easy bruising  [] Easy bleeding   [] Hypercoagulable state   [] Anemic   Gastrointestinal:  [] Blood in stool   [] Vomiting blood  [] Gastroesophageal reflux/heartburn   [] Abdominal pain Genitourinary:  [x] Chronic kidney disease   [] Difficult urination  [] Frequent urination  [] Burning with urination   [] Hematuria Skin:  [] Rashes   [] Ulcers    [] Wounds Psychological:  [] History of anxiety   []  History of major depression.  Physical Examination  BP (!) 203/83   Pulse 68   Ht 5\' 6"  (1.676 m)   Wt 176 lb (79.8 kg)   BMI 28.41 kg/m  Gen:  WD/WN, NAD Head: [place]/AT, No temporalis wasting. Ear/Nose/Throat: Hearing grossly intact, nares w/o erythema or drainage Eyes: Conjunctiva clear. Sclera non-icteric Neck: Supple.  Trachea midline Pulmonary:  Good air movement, no use of accessory muscles.  Cardiac: RRR, no JVD Vascular:  Vessel Right Left  Radial Palpable Palpable               Musculoskeletal: M/S 5/5 throughout.  No deformity or atrophy. Trace LE edema. Neurologic: Sensation grossly intact in extremities.  Symmetrical.  Speech is fluent.  Psychiatric: Judgment intact, Mood & affect appropriate for pt's clinical situation. Dermatologic: No rashes or ulcers noted.  No cellulitis or open wounds.      Labs Recent Results (from the past 2160 hour(s))  Protime-INR     Status: None   Collection Time: 06/06/21  9:10 PM  Result Value Ref Range   Prothrombin Time 13.2 11.4 - 15.2 seconds   INR 1.0 0.8 - 1.2    Comment: (NOTE) INR goal varies based on device and disease states. Performed at [facility], [address] [place], [place] [address]   APTT     Status: None   Collection Time: 06/06/21  9:10 PM  Result Value Ref Range   aPTT 29 24 - 36 seconds    Comment: Performed at [facility], [address] [place], [place] [address]  CBC     Status: Abnormal   Collection Time: 06/06/21  9:10 PM  Result Value Ref Range   WBC 5.8 4.0 - 10.5 K/uL   RBC 3.18 (L) 3.87 - 5.11 MIL/uL   Hemoglobin 10.0 (L) 12.0 - 15.0 g/dL   HCT 17.6 (L) 87.4 - 51.3 %   MCV 94.0 80.0 - 100.0 fL   MCH 31.4 26.0 - 34.0 pg   MCHC 33.4 30.0 - 36.0 g/dL   RDW 25.9 92.7 - 73.5 %   Platelets 213 150 - 400 K/uL   nRBC 0.0 0.0 - 0.2 %    Comment: Performed at [facility], [address] [place],  [place] [address]  Differential     Status: None   Collection Time: 06/06/21  9:10 PM  Result Value Ref Range   Neutrophils Relative % 43 %   Neutro Abs 2.5 1.7 - 7.7 K/uL   Lymphocytes Relative 42 %   Lymphs Abs 2.4 0.7 - 4.0 K/uL   Monocytes Relative 9 %   Monocytes Absolute 0.5 0.1 - 1.0 K/uL   Eosinophils Relative 5 %   Eosinophils Absolute 0.3 0.0 - 0.5 K/uL   Basophils Relative 1 %   Basophils Absolute 0.0 0.0 - 0.1 K/uL   Immature Granulocytes 0 %   Abs Immature Granulocytes 0.01 0.00 - 0.07 K/uL    Comment: Performed at [facility], [address] [place], [place] [address]  Comprehensive metabolic panel     Status: Abnormal   Collection Time: 06/06/21  9:10 PM  Result Value Ref Range   Sodium 131 (L) 135 - 145 mmol/L   Potassium 4.4 3.5 - 5.1 mmol/L   Chloride 98 98 - 111 mmol/L   CO2 25 22 - 32 mmol/L   Glucose, Bld 119 (H) 70 - 99 mg/dL    Comment: Glucose reference range applies only to samples taken after fasting for at least 8 hours.   BUN 26 (H) 8 - 23 mg/dL   Creatinine, Ser 0.87 (H) 0.44 - 1.00 mg/dL   Calcium 9.0 8.9 - 02.9 mg/dL  Total Protein 6.6 6.5 - 8.1 g/dL   Albumin 3.7 3.5 - 5.0 g/dL   AST 20 15 - 41 U/L   ALT 12 0 - 44 U/L   Alkaline Phosphatase 89 38 - 126 U/L   Total Bilirubin 0.6 0.3 - 1.2 mg/dL   GFR, Estimated 26 (L) >60 mL/min    Comment: (NOTE) Calculated using the CKD-EPI Creatinine Equation (2021)    Anion gap 8 5 - 15    Comment: Performed at [facility], [address] [place], [place] [address]  CBG monitoring, ED     Status: Abnormal   Collection Time: 06/06/21  9:10 PM  Result Value Ref Range   Glucose-Capillary 114 (H) 70 - 99 mg/dL    Comment: Glucose reference range applies only to samples taken after fasting for at least 8 hours.  Brain natriuretic peptide     Status: Abnormal   Collection Time: 06/06/21  9:10 PM  Result Value Ref Range   B Natriuretic Peptide 156.9 (H) 0.0 - 100.0 pg/mL    Comment:  Performed at [facility], [place], [place] [address]  Troponin I (High Sensitivity)     Status: None   Collection Time: 06/06/21  9:57 PM  Result Value Ref Range   Troponin I (High Sensitivity) 9 <18 ng/L    Comment: (NOTE) Elevated high sensitivity troponin I (hsTnI) values and significant  changes across serial measurements may suggest ACS but many other  chronic and acute conditions are known to elevate hsTnI results.  Refer to the "Links" section for chest pain algorithms and additional  guidance. Performed at [facility], [address] [place], [place] [address]   Resp Panel by RT-PCR (Flu A&B, Covid) Nasopharyngeal Swab     Status: None   Collection Time: 06/06/21  9:57 PM   Specimen: Nasopharyngeal Swab; Nasopharyngeal(NP) swabs in vial transport medium  Result Value Ref Range   SARS Coronavirus 2 by RT PCR NEGATIVE NEGATIVE    Comment: (NOTE) SARS-CoV-2 target nucleic acids are NOT DETECTED.  The SARS-CoV-2 RNA is generally detectable in upper respiratory specimens during the acute phase of infection. The lowest concentration of SARS-CoV-2 viral copies this assay can detect is 138 copies/mL. A negative result does not preclude SARS-Cov-2 infection and should not be used as the sole basis for treatment or other patient management decisions. A negative result may occur with  improper specimen collection/handling, submission of specimen other than nasopharyngeal swab, presence of viral mutation(s) within the areas targeted by this assay, and inadequate number of viral copies(<138 copies/mL). A negative result must be combined with clinical observations, patient history, and epidemiological information. The expected result is Negative.  Fact Sheet for Patients:  EntrepreneurPulse.com.au  Fact Sheet for Healthcare Providers:  IncredibleEmployment.be  This test is no t yet approved or cleared by the  [place] FDA and  has been authorized for detection and/or diagnosis of SARS-CoV-2 by FDA under an Emergency Use Authorization (EUA). This EUA will remain  in effect (meaning this test can be used) for the duration of the COVID-19 declaration under Section 564(b)(1) of the Act, 21 U.S.C.section 360bbb-3(b)(1), unless the authorization is terminated  or revoked sooner.       Influenza A by PCR NEGATIVE NEGATIVE   Influenza B by PCR NEGATIVE NEGATIVE    Comment: (NOTE) The Xpert Xpress SARS-CoV-2/FLU/RSV plus assay is intended as an aid in the diagnosis of influenza from Nasopharyngeal swab specimens and should not be  used as a sole basis for treatment. Nasal washings and aspirates are unacceptable for Xpert Xpress SARS-CoV-2/FLU/RSV testing.  Fact Sheet for Patients: EntrepreneurPulse.com.au  Fact Sheet for Healthcare Providers: IncredibleEmployment.be  This test is not yet approved or cleared by the [place] FDA and has been authorized for detection and/or diagnosis of SARS-CoV-2 by FDA under an Emergency Use Authorization (EUA). This EUA will remain in effect (meaning this test can be used) for the duration of the COVID-19 declaration under Section 564(b)(1) of the Act, 21 U.S.C. section 360bbb-3(b)(1), unless the authorization is terminated or revoked.  Performed at [facility], [place], [place] [address]   Troponin I (High Sensitivity)     Status: None   Collection Time: 06/06/21 11:45 PM  Result Value Ref Range   Troponin I (High Sensitivity) 9 <18 ng/L    Comment: (NOTE) Elevated high sensitivity troponin I (hsTnI) values and significant  changes across serial measurements may suggest ACS but many other  chronic and acute conditions are known to elevate hsTnI results.  Refer to the "Links" section for chest pain algorithms and additional  guidance. Performed at [facility], [address] [place], [place] [address]   Magnesium     Status: None   Collection Time: 06/06/21 11:45 PM  Result Value Ref Range   Magnesium 2.0 1.7 - 2.4 mg/dL    Comment: Performed at [facility], [address] [place], [place] [address]  Magnesium     Status: None   Collection Time: 06/07/21  7:40 AM  Result Value Ref Range   Magnesium 2.0 1.7 - 2.4 mg/dL    Comment: Performed at [facility], [address] [place], [place] [address]  Comprehensive metabolic panel     Status: Abnormal   Collection Time: 06/07/21  7:40 AM  Result Value Ref Range   Sodium 132 (L) 135 - 145 mmol/L   Potassium 4.7 3.5 - 5.1 mmol/L   Chloride 99 98 - 111 mmol/L   CO2 23 22 - 32 mmol/L   Glucose, Bld 109 (H) 70 - 99 mg/dL    Comment: Glucose reference range applies only to samples taken after fasting for at least 8 hours.   BUN 24 (H) 8 - 23 mg/dL   Creatinine, Ser 1.87 (H) 0.44 - 1.00 mg/dL   Calcium 9.6 8.9 - 10.3 mg/dL   Total Protein 6.9 6.5 - 8.1 g/dL   Albumin 3.8 3.5 - 5.0 g/dL   AST 23 15 - 41 U/L   ALT 12 0 - 44 U/L   Alkaline Phosphatase 84 38 - 126 U/L   Total Bilirubin 0.7 0.3 - 1.2 mg/dL   GFR, Estimated 28 (L) >60 mL/min    Comment: (NOTE) Calculated using the CKD-EPI Creatinine Equation (2021)    Anion gap 10 5 - 15    Comment: Performed at [facility], [address] [place], [place] [address]  CBC     Status: Abnormal   Collection Time: 06/07/21  7:40 AM  Result Value Ref Range   WBC 7.8 4.0 - 10.5 K/uL   RBC 3.50 (L) 3.87 - 5.11 MIL/uL   Hemoglobin 10.9 (L) 12.0 - 15.0 g/dL   HCT 32.1 (L) 36.0 - 46.0 %   MCV 91.7 80.0 - 100.0 fL   MCH 31.1 26.0 - 34.0 pg   MCHC 34.0 30.0 - 36.0 g/dL   RDW 12.4 11.5 - 15.5 %   Platelets 233 150 -  400 K/uL   nRBC 0.0 0.0 - 0.2 %    Comment: Performed at [facility], [address] [place], [place] [address]  Hemoglobin A1c     Status: Abnormal   Collection Time: 06/07/21  7:40 AM   Result Value Ref Range   Hgb A1c MFr Bld 5.7 (H) 4.8 - 5.6 %    Comment: (NOTE) Pre diabetes:          5.7%-6.4%  Diabetes:              >6.4%  Glycemic control for   <7.0% adults with diabetes    Mean Plasma Glucose 116.89 mg/dL    Comment: Performed at [facility] [address], [place], [place] [address]  Lipid panel     Status: Abnormal   Collection Time: 06/07/21  7:40 AM  Result Value Ref Range   Cholesterol 214 (H) 0 - 200 mg/dL   Triglycerides 82 <150 mg/dL   HDL 70 >40 mg/dL   Total CHOL/HDL Ratio 3.1 RATIO   VLDL 16 0 - 40 mg/dL   LDL Cholesterol 128 (H) 0 - 99 mg/dL    Comment:        Total Cholesterol/HDL:CHD Risk Coronary Heart Disease Risk Table                     Men   Women  1/2 Average Risk   3.4   3.3  Average Risk       5.0   4.4  2 X Average Risk   9.6   7.1  3 X Average Risk  23.4   11.0        Use the calculated Patient Ratio above and the CHD Risk Table to determine the patient's CHD Risk.        ATP III CLASSIFICATION (LDL):  <100     mg/dL   Optimal  100-129  mg/dL   Near or Above                    Optimal  130-159  mg/dL   Borderline  160-189  mg/dL   High  >190     mg/dL   Very High Performed at [facility], [address] [place], [place] [address]   Urinalysis, Complete w Microscopic     Status: Abnormal   Collection Time: 06/07/21  9:12 AM  Result Value Ref Range   Color, Urine YELLOW YELLOW   APPearance CLEAR CLEAR   Specific Gravity, Urine 1.020 1.005 - 1.030   pH 7.0 5.0 - 8.0   Glucose, UA NEGATIVE NEGATIVE mg/dL   Hgb urine dipstick MODERATE (A) NEGATIVE   Bilirubin Urine NEGATIVE NEGATIVE   Ketones, ur NEGATIVE NEGATIVE mg/dL   Protein, ur 100 (A) NEGATIVE mg/dL   Nitrite NEGATIVE NEGATIVE   Leukocytes,Ua NEGATIVE NEGATIVE   RBC / HPF 21-50 0 - 5 RBC/hpf   WBC, UA 0-5 0 - 5 WBC/hpf   Bacteria, UA NONE SEEN NONE SEEN   Squamous Epithelial / LPF 0-5 0 - 5   Mucus PRESENT     Comment: Performed at  [facility], [address] [place], [place] [address]  Sodium, urine, random     Status: None   Collection Time: 06/07/21  9:12 AM  Result Value Ref Range   Sodium, Ur 103 mmol/L    Comment: Performed at [facility], [address] [place], [place] [address]  Creatinine, urine,  random     Status: None   Collection Time: 06/07/21  9:12 AM  Result Value Ref Range   Creatinine, Urine 29 mg/dL    Comment: Performed at [facility], [address] [place], [place] [address]  ECHOCARDIOGRAM COMPLETE BUBBLE STUDY     Status: None   Collection Time: 06/07/21 12:11 PM  Result Value Ref Range   Ao pk vel 1.67 m/s   AR max vel 2.57 cm2   AV Peak grad 11.1 mmHg   S' Lateral 3.20 cm   Area-P 1/2 3.39 cm2  Renal function panel     Status: Abnormal   Collection Time: 06/08/21  7:07 AM  Result Value Ref Range   Sodium 131 (L) 135 - 145 mmol/L   Potassium 4.5 3.5 - 5.1 mmol/L   Chloride 98 98 - 111 mmol/L   CO2 23 22 - 32 mmol/L   Glucose, Bld 102 (H) 70 - 99 mg/dL    Comment: Glucose reference range applies only to samples taken after fasting for at least 8 hours.   BUN 26 (H) 8 - 23 mg/dL   Creatinine, Ser 2.51 (H) 0.44 - 1.00 mg/dL   Calcium 9.6 8.9 - 10.3 mg/dL   Phosphorus 4.1 2.5 - 4.6 mg/dL   Albumin 3.9 3.5 - 5.0 g/dL   GFR, Estimated 19 (L) >60 mL/min    Comment: (NOTE) Calculated using the CKD-EPI Creatinine Equation (2021)    Anion gap 10 5 - 15    Comment: Performed at [facility], [address] [place], [place] [address]  Magnesium     Status: None   Collection Time: 06/08/21  7:07 AM  Result Value Ref Range   Magnesium 1.9 1.7 - 2.4 mg/dL    Comment: Performed at [facility], [address] [place], [place] [address]  CBC     Status: Abnormal   Collection Time: 06/08/21  7:07 AM  Result Value Ref Range   WBC 7.0 4.0 - 10.5 K/uL   RBC 3.53 (L) 3.87 - 5.11 MIL/uL   Hemoglobin 10.8 (L) 12.0 - 15.0 g/dL   HCT 32.1  (L) 36.0 - 46.0 %   MCV 90.9 80.0 - 100.0 fL   MCH 30.6 26.0 - 34.0 pg   MCHC 33.6 30.0 - 36.0 g/dL   RDW 12.6 11.5 - 15.5 %   Platelets 255 150 - 400 K/uL   nRBC 0.0 0.0 - 0.2 %    Comment: Performed at [facility], [address] [place], [place] [address]  Renal function panel     Status: Abnormal   Collection Time: 06/09/21  5:05 AM  Result Value Ref Range   Sodium 133 (L) 135 - 145 mmol/L   Potassium 4.2 3.5 - 5.1 mmol/L   Chloride 99 98 - 111 mmol/L   CO2 24 22 - 32 mmol/L   Glucose, Bld 92 70 - 99 mg/dL    Comment: Glucose reference range applies only to samples taken after fasting for at least 8 hours.   BUN 30 (H) 8 - 23 mg/dL   Creatinine, Ser 2.55 (H) 0.44 - 1.00 mg/dL   Calcium 9.2 8.9 - 10.3 mg/dL   Phosphorus 4.4 2.5 - 4.6 mg/dL   Albumin 3.4 (L) 3.5 - 5.0 g/dL   GFR, Estimated 19 (L) >60 mL/min    Comment: (NOTE) Calculated using the CKD-EPI Creatinine Equation (2021)    Anion gap 10 5 - 15    Comment: Performed at  [facility], [address] [place], [place] [address]  CBC     Status: Abnormal   Collection Time: 06/09/21  5:05 AM  Result Value Ref Range   WBC 6.7 4.0 - 10.5 K/uL   RBC 3.28 (L) 3.87 - 5.11 MIL/uL   Hemoglobin 10.4 (L) 12.0 - 15.0 g/dL   HCT 30.5 (L) 36.0 - 46.0 %   MCV 93.0 80.0 - 100.0 fL   MCH 31.7 26.0 - 34.0 pg   MCHC 34.1 30.0 - 36.0 g/dL   RDW 12.3 11.5 - 15.5 %   Platelets 242 150 - 400 K/uL   nRBC 0.0 0.0 - 0.2 %    Comment: Performed at [facility], [address] [place], [place] [address]  Magnesium     Status: None   Collection Time: 06/09/21  5:05 AM  Result Value Ref Range   Magnesium 2.1 1.7 - 2.4 mg/dL    Comment: Performed at [facility], [address] [place], [place] [address]  Metanephrines, plasma     Status: None   Collection Time: 06/09/21  5:05 AM  Result Value Ref Range   Normetanephrine, Free 227.5 0.0 - 285.2 pg/mL    Comment: (NOTE) This test was developed  and its performance characteristics determined by [facility]. It has not been cleared or approved by the Food and Drug Administration.    Metanephrine, Free 35.9 0.0 - 88.0 pg/mL    Comment: (NOTE) This test was developed and its performance characteristics determined by [facility]. It has not been cleared or approved by the Food and Drug Administration. Performed At: [facility] [place], [place] [id number] Rush Farmer MD [id number]   Glucose, capillary     Status: Abnormal   Collection Time: 06/09/21 10:15 AM  Result Value Ref Range   Glucose-Capillary 133 (H) 70 - 99 mg/dL    Comment: Glucose reference range applies only to samples taken after fasting for at least 8 hours.  Troponin I (High Sensitivity)     Status: Abnormal   Collection Time: 06/09/21 10:43 AM  Result Value Ref Range   Troponin I (High Sensitivity) 21 (H) <18 ng/L    Comment: (NOTE) Elevated high sensitivity troponin I (hsTnI) values and significant  changes across serial measurements may suggest ACS but many other  chronic and acute conditions are known to elevate hsTnI results.  Refer to the "Links" section for chest pain algorithms and additional  guidance. Performed at [facility], [address], [place] [address]   Troponin I (High Sensitivity)     Status: Abnormal   Collection Time: 06/09/21 12:05 PM  Result Value Ref Range   Troponin I (High Sensitivity) 22 (H) <18 ng/L    Comment: (NOTE) Elevated high sensitivity troponin I (hsTnI) values and significant  changes across serial measurements may suggest ACS but many other  chronic and acute conditions are known to elevate hsTnI results.  Refer to the "Links" section for chest pain algorithms and additional  guidance. Performed at [facility], [address] [place], [place] [address]   Metanephrines, urine, 24 hour     Status: None   Collection Time: 06/09/21  4:50 PM  Result Value Ref  Range   Normetanephrine, Ur 365 Undefined ug/L    Comment: (NOTE) This test was developed and its performance characteristics determined by [facility]. It has not been cleared or approved by the Food and Drug Administration.    Normetanephrine, 24H Ur 529  131 - 612 ug/24 hr   Metaneph Total, Ur 72 Undefined ug/L    Comment: (NOTE) This test was developed and its performance characteristics determined by [facility]. It has not been cleared or approved by the Food and Drug Administration.    Metanephrines, 24H Ur 104 36 - 209 ug/24 hr    Comment: (NOTE) Performed At: [facility] [place], [place] [id number] Rush Farmer MD EI:[id number]    Total Volume 1,450     Comment: Performed at [facility], [address] [place], [place] [address]  Catecholamines,Ur.,Free,24 Hh     Status: None   Collection Time: 06/09/21  4:50 PM  Result Value Ref Range   Epinephrine, Vonita Moss [facility] ug/L    Comment: (NOTE) Test not performed. The specimen received was not preserved correctly. The pH was too high therefore we are unable to obtain valid results for the following test(s):      Nimisha P notified 06/15/2021 This test was developed and its performance characteristics determined by [facility]. It has not been cleared or approved by the Food and Drug Administration.    Norepinephrine, Rand Ur NOT PERFORMED     Comment: (NOTE) Test not performed This test was developed and its performance characteristics determined by [facility]. It has not been cleared or approved by the Food and Drug Administration.    Dopamine, Rand Ur NOT PERFORMED     Comment: (NOTE) Test not performed This test was developed and its performance characteristics determined by [facility]. It has not been cleared or approved by the Food and Drug Administration.    Total Volume 1,450     Comment: Performed at [facility], [address] [place], [place] [address]  Renal function panel      Status: Abnormal   Collection Time: 06/10/21  5:10 AM  Result Value Ref Range   Sodium 132 (L) 135 - 145 mmol/L   Potassium 4.6 3.5 - 5.1 mmol/L   Chloride 99 98 - 111 mmol/L   CO2 26 22 - 32 mmol/L   Glucose, Bld 96 70 - 99 mg/dL    Comment: Glucose reference range applies only to samples taken after fasting for at least 8 hours.   BUN 30 (H) 8 - 23 mg/dL   Creatinine, Ser 2.31 (H) 0.44 - 1.00 mg/dL   Calcium 8.9 8.9 - 10.3 mg/dL   Phosphorus 3.8 2.5 - 4.6 mg/dL   Albumin 3.2 (L) 3.5 - 5.0 g/dL   GFR, Estimated 22 (L) >60 mL/min    Comment: (NOTE) Calculated using the CKD-EPI Creatinine Equation (2021)    Anion gap 7 5 - 15    Comment: Performed at [facility], [address] [place], [place] [address]  Magnesium     Status: None   Collection Time: 06/10/21  5:10 AM  Result Value Ref Range   Magnesium 2.1 1.7 - 2.4 mg/dL    Comment: Performed at [facility], [address] [place], [place] [address]  CBC     Status: Abnormal   Collection Time: 06/10/21  5:10 AM  Result Value Ref Range   WBC 6.7 4.0 - 10.5 K/uL   RBC 3.17 (L) 3.87 - 5.11 MIL/uL   Hemoglobin 9.5 (L) 12.0 - 15.0 g/dL   HCT 29.1 (L) 36.0 - 46.0 %   MCV 91.8 80.0 - 100.0 fL   MCH 30.0 26.0 - 34.0 pg   MCHC 32.6 30.0 - 36.0 g/dL   RDW 12.2 11.5 -  15.5 %   Platelets 229 150 - 400 K/uL   nRBC 0.0 0.0 - 0.2 %    Comment: Performed at [facility], [address] [place], [place] [address]  Aldosterone + renin activity w/ ratio     Status: None   Collection Time: 06/10/21  5:10 AM  Result Value Ref Range   PRA LC/MS/MS 0.241 0.167 - 5.380 ng/mL/hr    Comment: (NOTE) This test was developed and its performance characteristics determined by [facility]. It has not been cleared or approved by the Food and Drug Administration.    ALDO / PRA Ratio 24.1 0.0 - 30.0    Comment: (NOTE)                         Units:      ng/dL per ng/mL/hr Performed At: [facility] [place], [place] [id number] Rush Farmer MD [id number]    Aldosterone 5.8 0.0 - 30.0 ng/dL    Comment: (NOTE) This test was developed and its performance characteristics determined by [facility]. It has not been cleared or approved by the Food and Drug Administration.   CBC     Status: Abnormal   Collection Time: 06/11/21  5:48 AM  Result Value Ref Range   WBC 7.2 4.0 - 10.5 K/uL   RBC 3.15 (L) 3.87 - 5.11 MIL/uL   Hemoglobin 9.6 (L) 12.0 - 15.0 g/dL   HCT 28.6 (L) 36.0 - 46.0 %   MCV 90.8 80.0 - 100.0 fL   MCH 30.5 26.0 - 34.0 pg   MCHC 33.6 30.0 - 36.0 g/dL   RDW 12.3 11.5 - 15.5 %   Platelets 230 150 - 400 K/uL   nRBC 0.0 0.0 - 0.2 %    Comment: Performed at [facility], [address], [place], [place] [address]  Basic metabolic panel     Status: Abnormal   Collection Time: 06/11/21  5:48 AM  Result Value Ref Range   Sodium 130 (L) 135 - 145 mmol/L   Potassium 4.3 3.5 - 5.1 mmol/L   Chloride 97 (L) 98 - 111 mmol/L   CO2 24 22 - 32 mmol/L   Glucose, Bld 92 70 - 99 mg/dL    Comment: Glucose reference range applies only to samples taken after fasting for at least 8 hours.   BUN 31 (H) 8 - 23 mg/dL   Creatinine, Ser 2.16 (H) 0.44 - 1.00 mg/dL   Calcium 9.2 8.9 - 10.3 mg/dL   GFR, Estimated 23 (L) >60 mL/min    Comment: (NOTE) Calculated using the CKD-EPI Creatinine Equation (2021)    Anion gap 9 5 - 15    Comment: Performed at [facility], [address] [place], [place] [address]  CBC     Status: Abnormal   Collection Time: 06/12/21  8:02 AM  Result Value Ref Range   WBC 9.5 4.0 - 10.5 K/uL   RBC 3.33 (L) 3.87 - 5.11 MIL/uL   Hemoglobin 10.3 (L) 12.0 - 15.0 g/dL   HCT 30.7 (L) 36.0 - 46.0 %   MCV 92.2 80.0 - 100.0 fL   MCH 30.9 26.0 - 34.0 pg   MCHC 33.6 30.0 - 36.0 g/dL   RDW 12.7 11.5 - 15.5 %   Platelets 224 150 - 400 K/uL   nRBC 0.0 0.0 - 0.2 %    Comment: Performed at [facility], [address]  [place], [place] [address]  Basic metabolic panel     Status: Abnormal   Collection Time: 06/12/21  8:02 AM  Result Value Ref Range   Sodium 130 (L) 135 - 145 mmol/L   Potassium 4.8 3.5 - 5.1 mmol/L   Chloride 98 98 - 111 mmol/L   CO2 23 22 - 32 mmol/L   Glucose, Bld 91 70 - 99 mg/dL    Comment: Glucose reference range applies only to samples taken after fasting for at least 8 hours.   BUN 41 (H) 8 - 23 mg/dL   Creatinine, Ser 4.04 (H) 0.44 - 1.00 mg/dL   Calcium 9.0 8.9 - 10.3 mg/dL   GFR, Estimated 11 (L) >60 mL/min    Comment: (NOTE) Calculated using the CKD-EPI Creatinine Equation (2021)    Anion gap 9 5 - 15    Comment: Performed at [facility], [address] [place], [place] [address]  CBC     Status: Abnormal   Collection Time: 06/13/21  6:42 AM  Result Value Ref Range   WBC 12.4 (H) 4.0 - 10.5 K/uL   RBC 2.99 (L) 3.87 - 5.11 MIL/uL   Hemoglobin 8.9 (L) 12.0 - 15.0 g/dL   HCT 27.3 (L) 36.0 - 46.0 %   MCV 91.3 80.0 - 100.0 fL   MCH 29.8 26.0 - 34.0 pg   MCHC 32.6 30.0 - 36.0 g/dL   RDW 12.5 11.5 - 15.5 %   Platelets 200 150 - 400 K/uL   nRBC 0.0 0.0 - 0.2 %    Comment: Performed at [facility], [address] [place], [place] [address]  Basic metabolic panel     Status: Abnormal   Collection Time: 06/13/21  6:42 AM  Result Value Ref Range   Sodium 129 (L) 135 - 145 mmol/L   Potassium 4.4 3.5 - 5.1 mmol/L   Chloride 96 (L) 98 - 111 mmol/L   CO2 20 (L) 22 - 32 mmol/L   Glucose, Bld 88 70 - 99 mg/dL    Comment: Glucose reference range applies only to samples taken after fasting for at least 8 hours.   BUN 48 (H) 8 - 23 mg/dL   Creatinine, Ser 5.05 (H) 0.44 - 1.00 mg/dL   Calcium 8.3 (L) 8.9 - 10.3 mg/dL   GFR, Estimated 8 (L) >60 mL/min    Comment: (NOTE) Calculated using the CKD-EPI Creatinine Equation (2021)    Anion gap 13 5 - 15    Comment: Performed at [facility], [address] [place], [place] [address]  CBC     Status:  Abnormal   Collection Time: 06/14/21  6:51 AM  Result Value Ref Range   WBC 9.7 4.0 - 10.5 K/uL   RBC 2.65 (L) 3.87 - 5.11 MIL/uL   Hemoglobin 8.5 (L) 12.0 - 15.0 g/dL   HCT 24.8 (L) 36.0 - 46.0 %   MCV 93.6 80.0 - 100.0 fL   MCH 32.1 26.0 - 34.0 pg   MCHC 34.3 30.0 - 36.0 g/dL   RDW 12.7 11.5 - 15.5 %   Platelets 187 150 - 400 K/uL   nRBC 0.0 0.0 - 0.2 %    Comment: Performed at [facility], [address] [place], [place] [address]  Basic metabolic panel     Status: Abnormal   Collection Time: 06/14/21  6:51 AM  Result Value Ref Range   Sodium 129 (L) 135 - 145 mmol/L   Potassium 4.6 3.5 - 5.1 mmol/L   Chloride 99  98 - 111 mmol/L   CO2 19 (L) 22 - 32 mmol/L   Glucose, Bld 95 70 - 99 mg/dL    Comment: Glucose reference range applies only to samples taken after fasting for at least 8 hours.   BUN 48 (H) 8 - 23 mg/dL   Creatinine, Ser 9.93 (H) 0.44 - 1.00 mg/dL   Calcium 8.3 (L) 8.9 - 10.3 mg/dL   GFR, Estimated 7 (L) >60 mL/min    Comment: (NOTE) Calculated using the CKD-EPI Creatinine Equation (2021)    Anion gap 11 5 - 15    Comment: Performed at [facility], [address] [place], [place] [address]  CBC     Status: Abnormal   Collection Time: 06/15/21  6:12 AM  Result Value Ref Range   WBC 8.4 4.0 - 10.5 K/uL   RBC 2.59 (L) 3.87 - 5.11 MIL/uL   Hemoglobin 8.1 (L) 12.0 - 15.0 g/dL   HCT 54.3 (L) 39.4 - 79.8 %   MCV 93.4 80.0 - 100.0 fL   MCH 31.3 26.0 - 34.0 pg   MCHC 33.5 30.0 - 36.0 g/dL   RDW 48.2 14.7 - 72.6 %   Platelets 202 150 - 400 K/uL   nRBC 0.0 0.0 - 0.2 %    Comment: Performed at [facility], [address], [place], [place] [address]  Basic metabolic panel     Status: Abnormal   Collection Time: 06/15/21  6:12 AM  Result Value Ref Range   Sodium 129 (L) 135 - 145 mmol/L   Potassium 4.7 3.5 - 5.1 mmol/L   Chloride 100 98 - 111 mmol/L   CO2 19 (L) 22 - 32 mmol/L   Glucose, Bld 93 70 - 99 mg/dL    Comment: Glucose  reference range applies only to samples taken after fasting for at least 8 hours.   BUN 47 (H) 8 - 23 mg/dL   Creatinine, Ser 0.74 (H) 0.44 - 1.00 mg/dL   Calcium 8.3 (L) 8.9 - 10.3 mg/dL   GFR, Estimated 8 (L) >60 mL/min    Comment: (NOTE) Calculated using the CKD-EPI Creatinine Equation (2021)    Anion gap 10 5 - 15    Comment: Performed at [facility], [address] [place], [place] [address]  CBC     Status: Abnormal   Collection Time: 06/16/21  4:42 AM  Result Value Ref Range   WBC 8.1 4.0 - 10.5 K/uL   RBC 2.44 (L) 3.87 - 5.11 MIL/uL   Hemoglobin 7.6 (L) 12.0 - 15.0 g/dL   HCT 44.8 (L) 11.7 - 56.7 %   MCV 93.4 80.0 - 100.0 fL   MCH 31.1 26.0 - 34.0 pg   MCHC 33.3 30.0 - 36.0 g/dL   RDW 48.0 55.3 - 61.2 %   Platelets 222 150 - 400 K/uL   nRBC 0.0 0.0 - 0.2 %    Comment: Performed at [facility], [address] [place], [place] [address]  Basic metabolic panel     Status: Abnormal   Collection Time: 06/16/21  4:42 AM  Result Value Ref Range   Sodium 130 (L) 135 - 145 mmol/L   Potassium 4.8 3.5 - 5.1 mmol/L   Chloride 103 98 - 111 mmol/L   CO2 18 (L) 22 - 32 mmol/L   Glucose, Bld 85 70 - 99 mg/dL    Comment: Glucose reference range applies only to samples taken after fasting for at least 8 hours.  BUN 44 (H) 8 - 23 mg/dL   Creatinine, Ser 4.95 (H) 0.44 - 1.00 mg/dL   Calcium 8.3 (L) 8.9 - 10.3 mg/dL   GFR, Estimated 9 (L) >60 mL/min    Comment: (NOTE) Calculated using the CKD-EPI Creatinine Equation (2021)    Anion gap 9 5 - 15    Comment: Performed at [facility], [address] [place], [place] [address]  CBC     Status: Abnormal   Collection Time: 06/17/21  5:14 AM  Result Value Ref Range   WBC 6.8 4.0 - 10.5 K/uL   RBC 2.52 (L) 3.87 - 5.11 MIL/uL   Hemoglobin 7.6 (L) 12.0 - 15.0 g/dL   HCT 23.6 (L) 36.0 - 46.0 %   MCV 93.7 80.0 - 100.0 fL   MCH 30.2 26.0 - 34.0 pg   MCHC 32.2 30.0 - 36.0 g/dL   RDW 12.5 11.5 - 15.5 %    Platelets 219 150 - 400 K/uL   nRBC 0.0 0.0 - 0.2 %    Comment: Performed at [facility], [address] [place], [place] [address]  Basic metabolic panel     Status: Abnormal   Collection Time: 06/17/21  5:14 AM  Result Value Ref Range   Sodium 129 (L) 135 - 145 mmol/L   Potassium 5.1 3.5 - 5.1 mmol/L   Chloride 106 98 - 111 mmol/L   CO2 19 (L) 22 - 32 mmol/L   Glucose, Bld 87 70 - 99 mg/dL    Comment: Glucose reference range applies only to samples taken after fasting for at least 8 hours.   BUN 37 (H) 8 - 23 mg/dL   Creatinine, Ser 4.14 (H) 0.44 - 1.00 mg/dL   Calcium 8.4 (L) 8.9 - 10.3 mg/dL   GFR, Estimated 11 (L) >60 mL/min    Comment: (NOTE) Calculated using the CKD-EPI Creatinine Equation (2021)    Anion gap 4 (L) 5 - 15    Comment: Performed at [facility], [address] [place], [place] [address]  Ferritin     Status: None   Collection Time: 06/17/21  5:14 AM  Result Value Ref Range   Ferritin 169 11 - 307 ng/mL    Comment: Performed at [facility], [address] [place], [place] [address]  Lipid Profile     Status: Abnormal   Collection Time: 07/01/21  3:37 PM  Result Value Ref Range   Cholesterol 217 (H) <200 mg/dL   HDL 75 > OR = 50 mg/dL   Triglycerides 83 <150 mg/dL   LDL Cholesterol (Calc) 124 (H) mg/dL (calc)    Comment: Reference range: <100 . Desirable range <100 mg/dL for primary prevention;   <70 mg/dL for patients with CHD or diabetic patients  with > or = 2 CHD risk factors. Marland Kitchen LDL-C is now calculated using the Martin-Hopkins  calculation, which is a validated novel method providing  better accuracy than the Friedewald equation in the  estimation of LDL-C.  Cresenciano Genre et al. Annamaria Helling. 9417;408(14): 2061-2068  (http://education.[facility].com/faq/FAQ164)    Total CHOL/HDL Ratio 2.9 <5.0 (calc)   Non-HDL Cholesterol (Calc) 142 (H) <130 mg/dL (calc)    Comment: For patients with diabetes plus 1 major ASCVD risk   factor, treating to a non-HDL-C goal of <100 mg/dL  (LDL-C of <70 mg/dL) is considered a therapeutic  option.   TSH     Status: None   Collection Time: 07/01/21  3:37 PM  Result Value Ref Range  TSH 2.46 0.40 - 4.50 mIU/L  Lactate dehydrogenase     Status: None   Collection Time: 07/06/21 11:58 AM  Result Value Ref Range   LDH 190 98 - 192 U/L    Comment: Performed at [facility], [address] [place], [place] [address]  Retic Panel     Status: Abnormal   Collection Time: 07/06/21 11:58 AM  Result Value Ref Range   Retic Ct Pct 1.9 0.4 - 3.1 %   RBC. 2.82 (L) 3.87 - 5.11 MIL/uL   Retic Count, Absolute 54.4 19.0 - 186.0 K/uL   Immature Retic Fract 10.8 2.3 - 15.9 %   Reticulocyte Hemoglobin 34.0 >27.9 pg    Comment:        Given the high negative predictive value of a RET-He result > 32 pg iron deficiency is essentially excluded. If this patient is anemic other etiologies should be considered. Performed at [facility], [address] [place], [place] [address]   Kappa/lambda light chains     Status: Abnormal   Collection Time: 07/06/21 11:58 AM  Result Value Ref Range   Kappa free light chain 74.5 (H) 3.3 - 19.4 mg/L   Lambda free light chains 44.1 (H) 5.7 - 26.3 mg/L   Kappa, lambda light chain ratio 1.69 (H) 0.26 - 1.65    Comment: (NOTE) Performed At: [facility] [place], [place] [id number] Rush Farmer MD NO:[id number]   Multiple Myeloma Panel (SPEP&IFE w/QIG)     Status: Abnormal   Collection Time: 07/06/21 11:58 AM  Result Value Ref Range   IgG (Immunoglobin G), Serum 731 586 - 1,602 mg/dL   IgA 339 64 - 422 mg/dL   IgM (Immunoglobulin M), Srm 15 (L) 26 - 217 mg/dL    Comment: Result confirmed on concentration.   Total Protein ELP 6.0 6.0 - 8.5 g/dL   Albumin SerPl Elph-Mcnc 3.3 2.9 - 4.4 g/dL   Alpha 1 0.3 0.0 - 0.4 g/dL   Alpha2 Glob SerPl Elph-Mcnc 0.7 0.4 - 1.0 g/dL   B-Globulin SerPl Elph-Mcnc 1.1 0.7 - 1.3  g/dL   Gamma Glob SerPl Elph-Mcnc 0.6 0.4 - 1.8 g/dL   M Protein SerPl Elph-Mcnc Not Observed Not Observed g/dL   Globulin, Total 2.7 2.2 - 3.9 g/dL   Albumin/Glob SerPl 1.3 0.7 - 1.7   IFE 1 Comment     Comment: (NOTE) The immunofixation pattern appears unremarkable. Evidence of monoclonal protein is not apparent.    Please Note Comment     Comment: (NOTE) Protein electrophoresis scan will follow via computer, mail, or courier delivery. Performed At: [facility] [place], [place] [id number] Rush Farmer MD [id number]   Folate     Status: None   Collection Time: 07/06/21 11:58 AM  Result Value Ref Range   Folate 25.0 >5.9 ng/mL    Comment: Performed at [facility], [address] [place], [place] [address]  Vitamin B12     Status: None   Collection Time: 07/06/21 11:58 AM  Result Value Ref Range   Vitamin B-12 634 180 - 914 pg/mL    Comment: (NOTE) This assay is not validated for testing neonatal or myeloproliferative syndrome specimens for Vitamin B12 levels. Performed at [facility], [address] [place], [place] [address]   Comprehensive metabolic panel     Status: Abnormal   Collection Time: 07/06/21 11:58 AM  Result Value Ref Range   Sodium 131 (L) 135 -  145 mmol/L   Potassium 3.9 3.5 - 5.1 mmol/L   Chloride 100 98 - 111 mmol/L   CO2 22 22 - 32 mmol/L   Glucose, Bld 103 (H) 70 - 99 mg/dL    Comment: Glucose reference range applies only to samples taken after fasting for at least 8 hours.   BUN 36 (H) 8 - 23 mg/dL   Creatinine, Ser 3.13 (H) 0.44 - 1.00 mg/dL   Calcium 9.1 8.9 - 10.3 mg/dL   Total Protein 6.5 6.5 - 8.1 g/dL   Albumin 3.7 3.5 - 5.0 g/dL   AST 21 15 - 41 U/L   ALT 12 0 - 44 U/L   Alkaline Phosphatase 64 38 - 126 U/L   Total Bilirubin 0.5 0.3 - 1.2 mg/dL   GFR, Estimated 15 (L) >60 mL/min    Comment: (NOTE) Calculated using the CKD-EPI Creatinine Equation (2021)    Anion gap 9 5 - 15    Comment:  Performed at [facility], [address] [place], [place] [address]  CBC with Differential/Platelet     Status: Abnormal   Collection Time: 07/06/21 11:58 AM  Result Value Ref Range   WBC 5.2 4.0 - 10.5 K/uL   RBC 2.80 (L) 3.87 - 5.11 MIL/uL   Hemoglobin 8.5 (L) 12.0 - 15.0 g/dL   HCT 26.1 (L) 36.0 - 46.0 %   MCV 93.2 80.0 - 100.0 fL   MCH 30.4 26.0 - 34.0 pg   MCHC 32.6 30.0 - 36.0 g/dL   RDW 12.6 11.5 - 15.5 %   Platelets 213 150 - 400 K/uL   nRBC 0.0 0.0 - 0.2 %   Neutrophils Relative % 57 %   Neutro Abs 3.1 1.7 - 7.7 K/uL   Lymphocytes Relative 24 %   Lymphs Abs 1.2 0.7 - 4.0 K/uL   Monocytes Relative 9 %   Monocytes Absolute 0.5 0.1 - 1.0 K/uL   Eosinophils Relative 8 %   Eosinophils Absolute 0.4 0.0 - 0.5 K/uL   Basophils Relative 1 %   Basophils Absolute 0.0 0.0 - 0.1 K/uL   Immature Granulocytes 1 %   Abs Immature Granulocytes 0.03 0.00 - 0.07 K/uL    Comment: Performed at [facility], [place], [place] [address]  Iron and TIBC     Status: None   Collection Time: 07/06/21 11:58 AM  Result Value Ref Range   Iron 62 28 - 170 ug/dL   TIBC 319 250 - 450 ug/dL   Saturation Ratios 19 10.4 - 31.8 %   UIBC 257 ug/dL    Comment: Performed at [facility], [address] [place], [place] [address]  Ferritin     Status: None   Collection Time: 07/06/21 11:58 AM  Result Value Ref Range   Ferritin 112 11 - 307 ng/mL    Comment: Performed at [facility], [address], [place], [place] [address]    Radiology No results found.  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  Blood pressure management as per primary  care physician.   Hyperlipidemia lipid control important in reducing the progression of atherosclerotic disease. 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  This has resulted in significant improvement in her blood pressure but it still remains elevated.  I will defer to her primary care physician/cardiologist/nephrologist for blood pressure management.  We will plan on a 19-month follow-up with duplex.    Leotis Pain, MD  08/25/2021 10:35 AM    This note was created with Dragon medical transcription system.  Any errors from dictation are purely unintentional

## 2021-08-25 NOTE — Progress Notes (Signed)
Subjective:   Betty Nielsen is a 75 y.o. female who presents for Medicare Annual (Subsequent) preventive examination.  Virtual Visit via Telephone Note  I connected with  Betty Nielsen on 08/25/21 at  3:30 PM EST by telephone and verified that I am speaking with the correct person using two identifiers.  Location: Patient: home Provider: Festus Persons participating in the virtual visit: [place]   I discussed the limitations, risks, security and privacy concerns of performing an evaluation and management service by telephone and the availability of in person appointments. The patient expressed understanding and agreed to proceed.  Interactive audio and video telecommunications were attempted between this nurse and patient, however failed, due to patient having technical difficulties OR patient did not have access to video capability.  We continued and completed visit with audio only.  Some vital signs may be absent or patient reported.   Clemetine Marker, LPN   Review of Systems     Cardiac Risk Factors include: advanced age (>64men, >6 women);dyslipidemia;hypertension;obesity (BMI >30kg/m2);sedentary lifestyle     Objective:    There were no vitals filed for this visit. There is no height or weight on file to calculate BMI.  Advanced Directives 08/25/2021 07/06/2021 06/06/2021 06/10/2020 10/25/2019 10/11/2019 06/07/2019  Does Patient Have a Medical Advance Directive? Yes Yes No No No;Yes Yes Yes  Type of Paramedic of [place];Living will - - - [place];Living will  Does patient want to make changes to medical advance directive? - - - - No - Patient declined No - Patient declined -  Copy of [place] in Chart? No - copy requested - - - - - No - copy requested  Would patient like information on creating a medical advance directive? - - No - Patient declined No - Patient  declined - No - Patient declined -    Current Medications (verified) Outpatient Encounter Medications as of 08/25/2021  Medication Sig   acetaminophen (TYLENOL) 650 MG CR tablet Take 650 mg by mouth every 8 (eight) hours as needed for pain.   aspirin EC 81 MG tablet Take 81 mg by mouth daily. Swallow whole.   atorvastatin (LIPITOR) 40 MG tablet Take 1 tablet (40 mg total) by mouth daily at 6 PM. Cholesterol medication.   cetirizine (ZYRTEC) 10 MG tablet Take 1 tablet (10 mg total) by mouth daily as needed. PRN only   Cholecalciferol 125 MCG (5000 UT) TABS Take 5,000 Units by mouth daily.   clopidogrel (PLAVIX) 75 MG tablet TAKE 1 TABLET BY MOUTH ONCE DAILY. BLOOD THINNER TO HELP REDUCE STROKE RISK   ezetimibe (ZETIA) 10 MG tablet Take 1 tablet (10 mg total) by mouth daily.   levothyroxine (SYNTHROID) 75 MCG tablet Take 1 tablet (75 mcg total) by mouth daily before breakfast.   nitroGLYCERIN (NITROSTAT) 0.4 MG SL tablet Place under the tongue.   Omega-3 Fatty Acids (FISH OIL) 1000 MG CPDR Take 1 capsule by mouth daily.   hydrochlorothiazide (MICROZIDE) 12.5 MG capsule Take 1 capsule (12.5 mg total) by mouth daily. (Patient not taking: Reported on 08/25/2021)   labetalol (NORMODYNE) 200 MG tablet Take 1 tablet (200 mg total) by mouth 2 (two) times daily.   NIFEdipine (PROCARDIA XL/NIFEDICAL XL) 60 MG 24 hr tablet Take 60 mg by mouth daily. (Patient not taking: Reported on 08/25/2021)   patiromer (VELTASSA) 8.4 g packet Take 1 packet (8.4 g total) by mouth daily. (Patient  not taking: Reported on 08/25/2021)   [DISCONTINUED] cloNIDine (CATAPRES - DOSED IN MG/24 HR) 0.2 mg/24hr patch Place 1 patch (0.2 mg total) onto the skin every 7 (seven) days.   No facility-administered encounter medications on file as of 08/25/2021.    Allergies (verified) Ace inhibitors, Ciprofloxacin, Citalopram, Hydrocodone-acetaminophen, Norvasc [amlodipine besylate], Pantoprazole sodium, Pravastatin, Sulfa antibiotics, and  Clonidine   History: Past Medical History:  Diagnosis Date   Arthrosis of knee    Chronic kidney disease    Hyperlipidemia    Hypertension    Loss of hearing    Metabolic syndrome    Plantar fasciitis    Renal insufficiency    Past Surgical History:  Procedure Laterality Date   ABDOMINAL AORTIC ANEURYSM REPAIR     ABDOMINAL HYSTERECTOMY     APPENDECTOMY     CORONARY STENT INTERVENTION N/A 10/26/2016   Procedure: Coronary Stent Intervention;  Surgeon: Yolonda Kida, MD;  Location: [facility];  Service: Cardiovascular;  Laterality: N/A;   HERNIA REPAIR     INNER EAR SURGERY     pt not sure of type   LEFT HEART CATH AND CORONARY ANGIOGRAPHY N/A 10/26/2016   Procedure: Left Heart Cath and Coronary Angiography;  Surgeon: Teodoro Spray, MD;  Location: [facility];  Service: Cardiovascular;  Laterality: N/A;   RENAL ANGIOGRAPHY N/A 06/11/2021   Procedure: RENAL ANGIOGRAPHY;  Surgeon: Algernon Huxley, MD;  Location: [facility];  Service: Cardiovascular;  Laterality: N/A;   THYROIDECTOMY     Family History  Problem Relation Age of Onset   Healthy Mother    Cerebral aneurysm Father    Social History   Socioeconomic History   Marital status: Widowed    Spouse name: Not on file   Number of children: 1   Years of education: GED   Highest education level: 10th grade  Occupational History   Occupation: Retired  Tobacco Use   Smoking status: Former    Packs/day: 0.50    Years: 20.00    Pack years: 10.00    Types: Cigarettes    Quit date: 2000    Years since quitting: 22.9   Smokeless tobacco: Never   Tobacco comments:    smoking cessation materials not required  Vaping Use   Vaping Use: Never used  Substance and Sexual Activity   Alcohol use: No    Alcohol/week: 0.0 standard drinks   Drug use: No   Sexual activity: Not Currently  Other Topics Concern   Not on file  Social History Narrative   Lives alone   Retired from working a [place] Strain: Low Risk    Difficulty of Paying Living Expenses: Not hard at all  Food Insecurity: No Food Insecurity   Worried About Charity fundraiser in the Last Year: Never true   Arboriculturist in the Last Year: Never true  Transportation Needs: No Transportation Needs   Lack of Transportation (Medical): No   Lack of Transportation (Non-Medical): No  Physical Activity: Inactive   Days of Exercise per Week: 0 days   Minutes of Exercise per Session: 0 min  Stress: No Stress Concern Present   Feeling of Stress : Only a little  Social Connections: Moderately Isolated   Frequency of Communication with Friends and Family: More than three times a week   Frequency of Social Gatherings with Friends and Family: Once a week  Attends Religious Services: More than 4 times per year   Active Member of Clubs or Organizations: No   Attends Archivist Meetings: Never   Marital Status: Widowed    Tobacco Counseling Counseling given: Not Answered Tobacco comments: smoking cessation materials not required   Clinical Intake:  Pre-visit preparation completed: Yes  Pain : No/denies pain     Nutritional Risks: None Diabetes: No  How often do you need to have someone help you when you read instructions, pamphlets, or other written materials from your doctor or pharmacy?: 1 - Never    Interpreter Needed?: No  Information entered by :: Clemetine Marker LPN   Activities of Daily Living In your present state of health, do you have any difficulty performing the following activities: 08/25/2021 07/01/2021  Hearing? Tempie Donning  Vision? N N  Difficulty concentrating or making decisions? N N  Walking or climbing stairs? Y Y  Dressing or bathing? Y Y  Doing errands, shopping? Tempie Donning  Preparing Food and eating ? N -  Using the Toilet? N -  In the past six months, have you accidently leaked urine? N -  Do you have problems with loss of bowel  control? N -  Managing your Medications? N -  Managing your Finances? N -  Housekeeping or managing your Housekeeping? N -  Some recent data might be hidden    Patient Care Team: Steele Sizer, MD as PCP - General (Family Medicine) Lavonia Dana, MD as Consulting Physician (Internal Medicine) Yolonda Kida, MD as Consulting Physician (Cardiology) Emmaline Kluver., MD as Consulting Physician (Rheumatology) Angelia Mould, MD as Consulting Physician (Vascular Surgery) Neldon Labella, RN as Case Manager Germaine Pomfret, [facility] (Pharmacist) Earlie Server, MD as Consulting Physician (Hematology and Oncology)  Indicate any recent Medical Services you may have received from other than [facility] providers in the past year (date may be approximate).     Assessment:   This is a routine wellness examination for [place].  Hearing/Vision screen Hearing Screening - Comments:: Pt c/o hearing difficulty; is only able to hear out of left ear Vision Screening - Comments:: Past due for eye exam  Dietary issues and exercise activities discussed: Current Exercise Habits: The patient does not participate in regular exercise at present, Exercise limited by: orthopedic condition(s)   Goals Addressed             This Visit's Progress    Reduce sodium intake   On track    Recommend to continue decreasing sodium intake in daily diet.       Depression Screen PHQ 2/9 Scores 08/25/2021 07/02/2021 07/01/2021 06/19/2021 12/16/2020 06/18/2020 06/10/2020  PHQ - 2 Score 0 0 0 0 0 0 0  PHQ- 9 Score - - - 3 0 - -  Exception Documentation - - - - - - -    Fall Risk Fall Risk  08/25/2021 07/02/2021 07/01/2021 06/19/2021 12/16/2020  Falls in the past year? 0 0 0 0 0  Number falls in past yr: 0 0 - 0 0  Injury with Fall? 0 - 0 0 0  Risk for fall due to : Impaired balance/gait Impaired balance/gait;Medication side effect Impaired mobility Impaired mobility -  Risk for fall due to: Comment - - - -  -  Follow up Falls prevention discussed Falls prevention discussed Falls prevention discussed Falls prevention discussed -    FALL RISK PREVENTION PERTAINING TO THE HOME:  Any stairs in or around the home? Yes  If so, are there any without handrails? No  Home free of loose throw rugs in walkways, pet beds, electrical cords, etc? Yes  Adequate lighting in your home to reduce risk of falls? Yes   ASSISTIVE DEVICES UTILIZED TO PREVENT FALLS:  Life alert? No  Use of a cane, walker or w/c? Yes  Grab bars in the bathroom? No  Shower chair or bench in shower? No  Elevated toilet seat or a handicapped toilet? No   TIMED UP AND GO:  Was the test performed? No . Telephonic visit  Cognitive Function: Normal cognitive status assessed by direct observation by this Nurse Health Advisor. No abnormalities found.       6CIT Screen 06/07/2019 03/30/2018  What Year? 0 points 0 points  What month? 0 points 0 points  What time? 0 points 0 points  Count back from 20 0 points 0 points  Months in reverse 0 points 0 points  Repeat phrase 0 points 2 points  Total Score 0 2    Immunizations Immunization History  Administered Date(s) Administered   Influenza, High Dose Seasonal PF 06/05/2015, 05/12/2017, 06/03/2019, 07/20/2019   Influenza-Unspecified 05/12/2017, 06/02/2020, 05/28/2021   PFIZER(Purple Top)SARS-COV-2 Vaccination 11/12/2019, 11/30/2019, 09/15/2020   Pneumococcal Conjugate-13 07/17/2014   Pneumococcal Polysaccharide-23 11/24/2015    TDAP status: Due, Education has been provided regarding the importance of this vaccine. Advised may receive this vaccine at local pharmacy or Health Dept. Aware to provide a copy of the vaccination record if obtained from local pharmacy or Health Dept. Verbalized acceptance and understanding.  Flu Vaccine status: Up to date  Pneumococcal vaccine status: Up to date  Covid-19 vaccine status: Completed vaccines  Qualifies for Shingles Vaccine? Yes    Zostavax completed No   Shingrix Completed?: No.    Education has been provided regarding the importance of this vaccine. Patient has been advised to call insurance company to determine out of pocket expense if they have not yet received this vaccine. Advised may also receive vaccine at local pharmacy or Health Dept. Verbalized acceptance and understanding.  Screening Tests Health Maintenance  Topic Date Due   Zoster Vaccines- Shingrix (1 of 2) Never done   COVID-19 Vaccine (4 - Booster for Pfizer series) 11/10/2020   DEXA SCAN  08/25/2022 (Originally 05/29/2011)   COLONOSCOPY (Pts 45-44yrs Insurance coverage will need to be confirmed)  08/25/2022 (Originally 05/29/1991)   Pneumonia Vaccine 28+ Years old  Completed   INFLUENZA VACCINE  Completed   Hepatitis C Screening  Completed   HPV VACCINES  Aged Out   TETANUS/TDAP  Discontinued    Health Maintenance  Health Maintenance Due  Topic Date Due   Zoster Vaccines- Shingrix (1 of 2) Never done   COVID-19 Vaccine (4 - Booster for Pfizer series) 11/10/2020    Colorectal cancer screening: No longer required.   Mammogram status: declined  Bone density status: declined  Lung Cancer Screening: (Low Dose CT Chest recommended if Age 27-80 years, 30 pack-year currently smoking OR have quit w/in 15years.) does not qualify.   Additional Screening:  Hepatitis C Screening: does qualify; Completed 09/03/12  Vision Screening: Recommended annual ophthalmology exams for early detection of glaucoma and other disorders of the eye. Is the patient up to date with their annual eye exam?  no Who is the provider or what is the name of the office in which the patient attends annual eye exams? Not established If pt is not established with a provider, would they like to be referred to a  provider to establish care? No .   Dental Screening: Recommended annual dental exams for proper oral hygiene  Community Resource Referral / Chronic Care Management: CRR  required this visit?  No   CCM required this visit?  No      Plan:     I have personally reviewed and noted the following in the patient's chart:   Medical and social history Use of alcohol, tobacco or illicit drugs  Current medications and supplements including opioid prescriptions.  Functional ability and status Nutritional status Physical activity Advanced directives List of other physicians Hospitalizations, surgeries, and ER visits in previous 12 months Vitals Screenings to include cognitive, depression, and falls Referrals and appointments  In addition, I have reviewed and discussed with patient certain preventive protocols, quality metrics, and best practice recommendations. A written personalized care plan for preventive services as well as general preventive health recommendations were provided to patient.     Clemetine Marker, LPN   [id number]/11/938   Nurse Notes: none

## 2021-08-25 NOTE — Patient Instructions (Signed)
Betty Nielsen , Thank you for taking time to come for your Medicare Wellness Visit. I appreciate your ongoing commitment to your health goals. Please review the following plan we discussed and let me know if I can assist you in the future.   Screening recommendations/referrals: Colonoscopy: no longer required Mammogram: declined Bone Density: declined Recommended yearly ophthalmology/optometry visit for glaucoma screening and checkup Recommended yearly dental visit for hygiene and checkup  Vaccinations: Influenza vaccine: done 05/28/21 Pneumococcal vaccine: done 11/24/15 Tdap vaccine: due Shingles vaccine: Shingrix discussed. Please contact your pharmacy for coverage information.  Covid-19: done 11/12/19, 11/30/19 & 09/15/20  Advanced directives: Please bring a copy of your health care power of attorney and living will to the office at your convenience.   Conditions/risks identified: Recommend increasing physical activity   Next appointment: Follow up in one year for your annual wellness visit    Preventive Care 65 Years and Older, Female Preventive care refers to lifestyle choices and visits with your health care provider that can promote health and wellness. What does preventive care include? A yearly physical exam. This is also called an annual well check. Dental exams once or twice a year. Routine eye exams. Ask your health care provider how often you should have your eyes checked. Personal lifestyle choices, including: Daily care of your teeth and gums. Regular physical activity. Eating a healthy diet. Avoiding tobacco and drug use. Limiting alcohol use. Practicing safe sex. Taking low-dose aspirin every day. Taking vitamin and mineral supplements as recommended by your health care provider. What happens during an annual well check? The services and screenings done by your health care provider during your annual well check will depend on your age, overall health, lifestyle risk  factors, and family history of disease. Counseling  Your health care provider may ask you questions about your: Alcohol use. Tobacco use. Drug use. Emotional well-being. Home and relationship well-being. Sexual activity. Eating habits. History of falls. Memory and ability to understand (cognition). Work and work Statistician. Reproductive health. Screening  You may have the following tests or measurements: Height, weight, and BMI. Blood pressure. Lipid and cholesterol levels. These may be checked every 5 years, or more frequently if you are over 50 years old. Skin check. Lung cancer screening. You may have this screening every year starting at age 37 if you have a 30-pack-year history of smoking and currently smoke or have quit within the past 15 years. Fecal occult blood test (FOBT) of the stool. You may have this test every year starting at age 70. Flexible sigmoidoscopy or colonoscopy. You may have a sigmoidoscopy every 5 years or a colonoscopy every 10 years starting at age 83. Hepatitis C blood test. Hepatitis B blood test. Sexually transmitted disease (STD) testing. Diabetes screening. This is done by checking your blood sugar (glucose) after you have not eaten for a while (fasting). You may have this done every 1-3 years. Bone density scan. This is done to screen for osteoporosis. You may have this done starting at age 77. Mammogram. This may be done every 1-2 years. Talk to your health care provider about how often you should have regular mammograms. Talk with your health care provider about your test results, treatment options, and if necessary, the need for more tests. Vaccines  Your health care provider may recommend certain vaccines, such as: Influenza vaccine. This is recommended every year. Tetanus, diphtheria, and acellular pertussis (Tdap, Td) vaccine. You may need a Td booster every 10 years. Zoster vaccine. You may  need this after age 2. Pneumococcal 13-valent  conjugate (PCV13) vaccine. One dose is recommended after age 48. Pneumococcal polysaccharide (PPSV23) vaccine. One dose is recommended after age 55. Talk to your health care provider about which screenings and vaccines you need and how often you need them. This information is not intended to replace advice given to you by your health care provider. Make sure you discuss any questions you have with your health care provider. Document Released: 10/03/2015 Document Revised: 05/26/2016 Document Reviewed: 07/08/2015 Elsevier Interactive Patient Education  2017 [employer] Prevention in the Home Falls can cause injuries. They can happen to people of all ages. There are many things you can do to make your home safe and to help prevent falls. What can I do on the outside of my home? Regularly fix the edges of walkways and driveways and fix any cracks. Remove anything that might make you trip as you walk through a door, such as a raised step or threshold. Trim any bushes or trees on the path to your home. Use bright outdoor lighting. Clear any walking paths of anything that might make someone trip, such as rocks or tools. Regularly check to see if handrails are loose or broken. Make sure that both sides of any steps have handrails. Any raised decks and porches should have guardrails on the edges. Have any leaves, snow, or ice cleared regularly. Use sand or salt on walking paths during winter. Clean up any spills in your garage right away. This includes oil or grease spills. What can I do in the bathroom? Use night lights. Install grab bars by the toilet and in the tub and shower. Do not use towel bars as grab bars. Use non-skid mats or decals in the tub or shower. If you need to sit down in the shower, use a plastic, non-slip stool. Keep the floor dry. Clean up any water that spills on the floor as soon as it happens. Remove soap buildup in the tub or shower regularly. Attach bath mats  securely with double-sided non-slip rug tape. Do not have throw rugs and other things on the floor that can make you trip. What can I do in the bedroom? Use night lights. Make sure that you have a light by your bed that is easy to reach. Do not use any sheets or blankets that are too big for your bed. They should not hang down onto the floor. Have a firm chair that has side arms. You can use this for support while you get dressed. Do not have throw rugs and other things on the floor that can make you trip. What can I do in the kitchen? Clean up any spills right away. Avoid walking on wet floors. Keep items that you use a lot in easy-to-reach places. If you need to reach something above you, use a strong step stool that has a grab bar. Keep electrical cords out of the way. Do not use floor polish or wax that makes floors slippery. If you must use wax, use non-skid floor wax. Do not have throw rugs and other things on the floor that can make you trip. What can I do with my stairs? Do not leave any items on the stairs. Make sure that there are handrails on both sides of the stairs and use them. Fix handrails that are broken or loose. Make sure that handrails are as long as the stairways. Check any carpeting to make sure that it is firmly attached  to the stairs. Fix any carpet that is loose or worn. Avoid having throw rugs at the top or bottom of the stairs. If you do have throw rugs, attach them to the floor with carpet tape. Make sure that you have a light switch at the top of the stairs and the bottom of the stairs. If you do not have them, ask someone to add them for you. What else can I do to help prevent falls? Wear shoes that: Do not have high heels. Have rubber bottoms. Are comfortable and fit you well. Are closed at the toe. Do not wear sandals. If you use a stepladder: Make sure that it is fully opened. Do not climb a closed stepladder. Make sure that both sides of the stepladder  are locked into place. Ask someone to hold it for you, if possible. Clearly mark and make sure that you can see: Any grab bars or handrails. First and last steps. Where the edge of each step is. Use tools that help you move around (mobility aids) if they are needed. These include: Canes. Walkers. Scooters. Crutches. Turn on the lights when you go into a dark area. Replace any light bulbs as soon as they burn out. Set up your furniture so you have a clear path. Avoid moving your furniture around. If any of your floors are uneven, fix them. If there are any pets around you, be aware of where they are. Review your medicines with your doctor. Some medicines can make you feel dizzy. This can increase your chance of falling. Ask your doctor what other things that you can do to help prevent falls. This information is not intended to replace advice given to you by your health care provider. Make sure you discuss any questions you have with your health care provider. Document Released: 07/03/2009 Document Revised: 02/12/2016 Document Reviewed: 10/11/2014 Elsevier Interactive Patient Education  2017 [employer].

## 2021-08-25 NOTE — Assessment & Plan Note (Signed)
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  This has resulted in significant improvement in her blood pressure but it still remains elevated.  I will defer to her primary care physician/cardiologist/nephrologist for blood pressure management.  We will plan on a 36-month follow-up with duplex.

## 2021-09-02 ENCOUNTER — Telehealth: Payer: Self-pay | Admitting: Family Medicine

## 2021-09-02 NOTE — Telephone Encounter (Signed)
Heather from [facility] called in says patient states that hydrochlorothiazide (MICROZIDE) 12.5 MG capsule is being recalled. He wants to confirm this. Please call back

## 2021-09-03 NOTE — Telephone Encounter (Signed)
I am unaware of a recall of HCTZ. I did recommend she discuss with Dr. Ancil Boozer and her nephrologist about stopping it as it is likely not giving much clinical benefit given her severe CKD.   Malva Limes, [place] Pharmacist Practitioner  [facility] [phone]

## 2021-09-04 NOTE — Telephone Encounter (Signed)
Patient contacted and updated. She expressed verbalized understanding that her medication has not been recalled.

## 2021-09-07 ENCOUNTER — Other Ambulatory Visit: Payer: Self-pay

## 2021-09-07 ENCOUNTER — Inpatient Hospital Stay: Payer: Medicare Other | Attending: Oncology

## 2021-09-07 DIAGNOSIS — N184 Chronic kidney disease, stage 4 (severe): Secondary | ICD-10-CM

## 2021-09-07 DIAGNOSIS — D631 Anemia in chronic kidney disease: Secondary | ICD-10-CM | POA: Diagnosis present

## 2021-09-07 DIAGNOSIS — Z87891 Personal history of nicotine dependence: Secondary | ICD-10-CM | POA: Diagnosis not present

## 2021-09-07 LAB — IRON AND TIBC
Iron: 66 ug/dL (ref 28–170)
Saturation Ratios: 26 % (ref 10.4–31.8)
TIBC: 258 ug/dL (ref 250–450)
UIBC: 192 ug/dL

## 2021-09-07 LAB — CBC WITH DIFFERENTIAL/PLATELET
Abs Immature Granulocytes: 0.07 10*3/uL (ref 0.00–0.07)
Basophils Absolute: 0 10*3/uL (ref 0.0–0.1)
Basophils Relative: 1 %
Eosinophils Absolute: 0.5 10*3/uL (ref 0.0–0.5)
Eosinophils Relative: 8 %
HCT: 27.5 % — ABNORMAL LOW (ref 36.0–46.0)
Hemoglobin: 8.8 g/dL — ABNORMAL LOW (ref 12.0–15.0)
Immature Granulocytes: 1 %
Lymphocytes Relative: 20 %
Lymphs Abs: 1.3 10*3/uL (ref 0.7–4.0)
MCH: 30.3 pg (ref 26.0–34.0)
MCHC: 32 g/dL (ref 30.0–36.0)
MCV: 94.8 fL (ref 80.0–100.0)
Monocytes Absolute: 0.6 10*3/uL (ref 0.1–1.0)
Monocytes Relative: 10 %
Neutro Abs: 3.9 10*3/uL (ref 1.7–7.7)
Neutrophils Relative %: 60 %
Platelets: 215 10*3/uL (ref 150–400)
RBC: 2.9 MIL/uL — ABNORMAL LOW (ref 3.87–5.11)
RDW: 13.2 % (ref 11.5–15.5)
WBC: 6.4 10*3/uL (ref 4.0–10.5)
nRBC: 0 % (ref 0.0–0.2)

## 2021-09-07 LAB — FERRITIN: Ferritin: 255 ng/mL (ref 11–307)

## 2021-09-07 LAB — RETIC PANEL
Immature Retic Fract: 15.5 % (ref 2.3–15.9)
RBC.: 2.84 MIL/uL — ABNORMAL LOW (ref 3.87–5.11)
Retic Count, Absolute: 54.5 10*3/uL (ref 19.0–186.0)
Retic Ct Pct: 1.9 % (ref 0.4–3.1)
Reticulocyte Hemoglobin: 32.8 pg (ref >27.9)

## 2021-09-09 ENCOUNTER — Encounter: Payer: Self-pay | Admitting: Oncology

## 2021-09-09 ENCOUNTER — Inpatient Hospital Stay: Payer: Medicare Other

## 2021-09-09 ENCOUNTER — Inpatient Hospital Stay: Payer: Medicare Other | Admitting: Oncology

## 2021-09-09 ENCOUNTER — Other Ambulatory Visit: Payer: Self-pay

## 2021-09-09 VITALS — BP 168/77 | HR 64 | Temp 97.1°F | Resp 18 | Wt 178.2 lb

## 2021-09-09 DIAGNOSIS — N184 Chronic kidney disease, stage 4 (severe): Secondary | ICD-10-CM | POA: Diagnosis not present

## 2021-09-09 DIAGNOSIS — D631 Anemia in chronic kidney disease: Secondary | ICD-10-CM | POA: Diagnosis not present

## 2021-09-10 ENCOUNTER — Encounter: Payer: Self-pay | Admitting: Oncology

## 2021-09-10 NOTE — Progress Notes (Signed)
Hematology/Oncology Progress note  [phone] [phone]   Patient Care Team: Steele Sizer, MD as PCP - General (Family Medicine) Lavonia Dana, MD as Consulting Physician (Internal Medicine) Yolonda Kida, MD as Consulting Physician (Cardiology) Emmaline Kluver., MD as Consulting Physician (Rheumatology) Angelia Mould, MD as Consulting Physician (Vascular Surgery) Neldon Labella, RN as Case Manager Germaine Pomfret, [facility] (Pharmacist) Earlie Server, MD as Consulting Physician (Hematology and Oncology)  REFERRING PROVIDER: Steele Sizer, MD  CHIEF COMPLAINTS/REASON FOR VISIT:  Evaluation of anemia  HISTORY OF PRESENTING ILLNESS:   Betty Nielsen is a  75 y.o.  female with PMH listed below was seen in consultation at the request of  Steele Sizer, MD  for evaluation of anemia.   Patient was accompanied by her daughter in law.  She has history of CKD follows up with nephrology.  + fatigue and tired. Denies any black stool or bloody stool, abdominal pain. 06/17/2021 CBC showed hemoglobin of 7.6, normal wbc and platelet.  Creatine 4.14, GFR 11.  Patient tells me that she does not want to go on hemodialysis.   INTERVAL HISTORY Betty Nielsen is a 75 y.o. female who has above history reviewed by me today presents for follow up visit for anemia During interval, patient has received IV Venofer treatments.  She reports that IV iron treatments have helped . fatigue has improved.   Review of Systems  Constitutional:  Positive for fatigue. Negative for appetite change, chills and fever.  HENT:   Negative for hearing loss and voice change.   Eyes:  Negative for eye problems.  Respiratory:  Positive for shortness of breath. Negative for chest tightness and cough.   Cardiovascular:  Negative for chest pain.  Gastrointestinal:  Negative for abdominal distention, abdominal pain and blood in stool.  Endocrine: Negative for hot flashes.   Genitourinary:  Negative for difficulty urinating and frequency.   Musculoskeletal:  Negative for arthralgias.  Skin:  Negative for itching and rash.  Neurological:  Negative for extremity weakness.  Hematological:  Negative for adenopathy.  Psychiatric/Behavioral:  Negative for confusion.    MEDICAL HISTORY:  Past Medical History:  Diagnosis Date   Arthrosis of knee    Chronic kidney disease    Hyperlipidemia    Hypertension    Loss of hearing    Metabolic syndrome    Plantar fasciitis    Renal insufficiency     SURGICAL HISTORY: Past Surgical History:  Procedure Laterality Date   ABDOMINAL AORTIC ANEURYSM REPAIR     ABDOMINAL HYSTERECTOMY     APPENDECTOMY     CORONARY STENT INTERVENTION N/A 10/26/2016   Procedure: Coronary Stent Intervention;  Surgeon: Yolonda Kida, MD;  Location: [facility];  Service: Cardiovascular;  Laterality: N/A;   HERNIA REPAIR     INNER EAR SURGERY     pt not sure of type   LEFT HEART CATH AND CORONARY ANGIOGRAPHY N/A 10/26/2016   Procedure: Left Heart Cath and Coronary Angiography;  Surgeon: Teodoro Spray, MD;  Location: [facility];  Service: Cardiovascular;  Laterality: N/A;   RENAL ANGIOGRAPHY N/A 06/11/2021   Procedure: RENAL ANGIOGRAPHY;  Surgeon: Algernon Huxley, MD;  Location: [facility];  Service: Cardiovascular;  Laterality: N/A;   THYROIDECTOMY      SOCIAL HISTORY: Social History   Socioeconomic History   Marital status: Widowed    Spouse name: Not on file   Number of children: 1   Years  of education: GED   Highest education level: 10th grade  Occupational History   Occupation: Retired  Tobacco Use   Smoking status: Former    Packs/day: 0.50    Years: 20.00    Pack years: 10.00    Types: Cigarettes    Quit date: 2000    Years since quitting: 22.9   Smokeless tobacco: Never   Tobacco comments:    smoking cessation materials not required  Vaping Use   Vaping Use: Never used  Substance and  Sexual Activity   Alcohol use: No    Alcohol/week: 0.0 standard drinks   Drug use: No   Sexual activity: Not Currently  Other Topics Concern   Not on file  Social History Narrative   Lives alone   Retired from working a [place] Strain: Low Risk    Difficulty of Paying Living Expenses: Not hard at all  Food Insecurity: No Food Insecurity   Worried About Charity fundraiser in the Last Year: Never true   Arboriculturist in the Last Year: Never true  Transportation Needs: No Transportation Needs   Lack of Transportation (Medical): No   Lack of Transportation (Non-Medical): No  Physical Activity: Inactive   Days of Exercise per Week: 0 days   Minutes of Exercise per Session: 0 min  Stress: No Stress Concern Present   Feeling of Stress : Only a little  Social Connections: Moderately Isolated   Frequency of Communication with Friends and Family: More than three times a week   Frequency of Social Gatherings with Friends and Family: Once a week   Attends Religious Services: More than 4 times per year   Active Member of [employer] or Organizations: No   Attends Archivist Meetings: Never   Marital Status: Widowed  Human resources officer Violence: Not At Risk   Fear of Current or Ex-Partner: No   Emotionally Abused: No   Physically Abused: No   Sexually Abused: No    FAMILY HISTORY: Family History  Problem Relation Age of Onset   Healthy Mother    Cerebral aneurysm Father     ALLERGIES:  is allergic to ace inhibitors, ciprofloxacin, citalopram, hydrocodone-acetaminophen, norvasc [amlodipine besylate], pantoprazole sodium, pravastatin, sulfa antibiotics, and clonidine.  MEDICATIONS:  Current Outpatient Medications  Medication Sig Dispense Refill   acetaminophen (TYLENOL) 650 MG CR tablet Take 650 mg by mouth every 8 (eight) hours as needed for pain.     aspirin EC 81 MG tablet Take 81 mg by mouth daily. Swallow whole.      atorvastatin (LIPITOR) 40 MG tablet Take 1 tablet (40 mg total) by mouth daily at 6 PM. Cholesterol medication. 90 tablet 1   cetirizine (ZYRTEC) 10 MG tablet Take 1 tablet (10 mg total) by mouth daily as needed. PRN only     Cholecalciferol 125 MCG (5000 UT) TABS Take 5,000 Units by mouth daily.     clopidogrel (PLAVIX) 75 MG tablet TAKE 1 TABLET BY MOUTH ONCE DAILY. BLOOD THINNER TO HELP REDUCE STROKE RISK 90 tablet 1   ezetimibe (ZETIA) 10 MG tablet Take 1 tablet (10 mg total) by mouth daily. 90 tablet 1   hydrochlorothiazide (MICROZIDE) 12.5 MG capsule Take 1 capsule (12.5 mg total) by mouth daily. 30 capsule 0   levothyroxine (SYNTHROID) 75 MCG tablet Take 1 tablet (75 mcg total) by mouth daily before breakfast. 90 tablet 1   Omega-3 Fatty Acids (FISH OIL)  1000 MG CPDR Take 1 capsule by mouth daily.     labetalol (NORMODYNE) 200 MG tablet Take 1 tablet (200 mg total) by mouth 2 (two) times daily. 60 tablet 0   NIFEdipine (PROCARDIA XL/NIFEDICAL XL) 60 MG 24 hr tablet Take 60 mg by mouth daily.     nitroGLYCERIN (NITROSTAT) 0.4 MG SL tablet Place under the tongue. (Patient not taking: Reported on 09/09/2021)     patiromer (VELTASSA) 8.4 g packet Take 1 packet (8.4 g total) by mouth daily. (Patient not taking: Reported on 08/25/2021) 30 each 0   No current facility-administered medications for this visit.     PHYSICAL EXAMINATION: ECOG PERFORMANCE STATUS: 1 - Symptomatic but completely ambulatory Vitals:   09/09/21 1424  BP: (!) 168/77  Pulse: 64  Resp: 18  Temp: (!) 97.1 F (36.2 C)   Filed Weights   09/09/21 1424  Weight: 178 lb 3.2 oz (80.8 kg)    Physical Exam Constitutional:      General: She is not in acute distress. HENT:     Head: Normocephalic and atraumatic.  Eyes:     General: No scleral icterus. Cardiovascular:     Rate and Rhythm: Normal rate and regular rhythm.     Heart sounds: Normal heart sounds.  Pulmonary:     Effort: Pulmonary effort is normal. No  respiratory distress.     Breath sounds: No wheezing.  Abdominal:     General: Bowel sounds are normal. There is no distension.     Palpations: Abdomen is soft.  Musculoskeletal:        General: No deformity. Normal range of motion.     Cervical back: Normal range of motion and neck supple.  Skin:    General: Skin is warm and dry.     Coloration: Skin is pale.     Findings: No erythema or rash.  Neurological:     Mental Status: She is alert and oriented to person, place, and time. Mental status is at baseline.     Cranial Nerves: No cranial nerve deficit.     Coordination: Coordination normal.  Psychiatric:        Mood and Affect: Mood normal.    LABORATORY DATA:  I have reviewed the data as listed Lab Results  Component Value Date   WBC 6.4 09/07/2021   HGB 8.8 (L) 09/07/2021   HCT 27.5 (L) 09/07/2021   MCV 94.8 09/07/2021   PLT 215 09/07/2021   Recent Labs    06/06/21 2110 06/07/21 0740 06/08/21 0707 06/09/21 0505 06/10/21 0510 06/11/21 0548 06/16/21 0442 06/17/21 0514 07/06/21 1158  NA 131* 132*   < > 133* 132*   < > 130* 129* 131*  K 4.4 4.7   < > 4.2 4.6   < > 4.8 5.1 3.9  CL 98 99   < > 99 99   < > 103 106 100  CO2 25 23   < > 24 26   < > 18* 19* 22  GLUCOSE 119* 109*   < > 92 96   < > 85 87 103*  BUN 26* 24*   < > 30* 30*   < > 44* 37* 36*  CREATININE 1.98* 1.87*   < > 2.55* 2.31*   < > 4.95* 4.14* 3.13*  CALCIUM 9.0 9.6   < > 9.2 8.9   < > 8.3* 8.4* 9.1  GFRNONAA 26* 28*   < > 19* 22*   < > 9* 11* 15*  PROT 6.6 6.9  --   --   --   --   --   --  6.5  ALBUMIN 3.7 3.8   < > 3.4* 3.2*  --   --   --  3.7  AST 20 23  --   --   --   --   --   --  21  ALT 12 12  --   --   --   --   --   --  12  ALKPHOS 89 84  --   --   --   --   --   --  64  BILITOT 0.6 0.7  --   --   --   --   --   --  0.5   < > = values in this interval not displayed.    Iron/TIBC/Ferritin/ %Sat    Component Value Date/Time   IRON 66 09/07/2021 1320   TIBC 258 09/07/2021 1320    FERRITIN 255 09/07/2021 1320   IRONPCTSAT 26 09/07/2021 1320      RADIOGRAPHIC STUDIES: I have personally reviewed the radiological images as listed and agreed with the findings in the report. VAS US RENAL ARTERY DUPLEX  Result Date: 08/28/2021 ABDOMINAL VISCERAL Patient Name:  MANNAT BENEDETTI Logie  Date of Exam:   08/25/2021 Medical Rec #: [id number]        Accession #:    [id number] Date of Birth: 20-Apr-1946         Patient Gender: F Patient Age:   40 years Exam Location:  [facility] Procedure:      VAS US RENAL ARTERY DUPLEX Referring Phys: [id number] Erskine Squibb DEW -------------------------------------------------------------------------------- Indications: RAS Vascular Interventions: 06/11/2021 right RA stent, confirmation of lt ra                         occlusion. Multiple cysts bilaterally.                         2009 AAA open repair. Comparison Study: 06/10/2021 Performing Technologist: Concha Norway RVT  Examination Guidelines: A complete evaluation includes B-mode imaging, spectral Doppler, color Doppler, and power Doppler as needed of all accessible portions of each vessel. Bilateral testing is considered an integral part of a complete examination. Limited examinations for reoccurring indications may be performed as noted.  Duplex Findings: +----------+--------+--------+------+--------+  Mesenteric PSV cm/s EDV cm/s Plaque Comments  +----------+--------+--------+------+--------+  Aorta Mid     72                              +----------+--------+--------+------+--------+    +------------------+--------+--------+-------+  Right Renal Artery PSV cm/s EDV cm/s Comment  +------------------+--------+--------+-------+  Proximal              52                      +------------------+--------+--------+-------+  Mid                   73                      +------------------+--------+--------+-------+  Distal                66                      +------------------+--------+--------+-------+  +-----------------+--------+--------+-----------+  Left Renal Artery PSV cm/s EDV cm/s   Comment    +-----------------+--------+--------+-----------+  Proximal                            hx occluded  +-----------------+--------+--------+-----------+  Distal              104       33                 +-----------------+--------+--------+-----------+ +------------+--------+--------+--+-----------+--------+--------+---+  Right Kidney PSV cm/s EDV cm/s RI Left Kidney PSV cm/s EDV cm/s RI   +------------+--------+--------+--+-----------+--------+--------+---+  Upper Pole                        Upper Pole                         +------------+--------+--------+--+-----------+--------+--------+---+  Mid                               Mid                                +------------+--------+--------+--+-----------+--------+--------+---+  Lower Pole                        Lower Pole                         +------------+--------+--------+--+-----------+--------+--------+---+  Hilar                             Hilar                              +------------+--------+--------+--+-----------+--------+--------+---+ +------------------+-----+------------------+----+  Right Kidney             Left Kidney              +------------------+-----+------------------+----+  RAR                      RAR                      +------------------+-----+------------------+----+  RAR (manual)       1.01  RAR (manual)       1.44  +------------------+-----+------------------+----+  Cortex                   Cortex                   +------------------+-----+------------------+----+  Cortex thickness         Corex thickness          +------------------+-----+------------------+----+  Kidney length (cm) 11.25 Kidney length (cm) 9.89  +------------------+-----+------------------+----+  Summary: Largest Aortic Diameter: 3.9 cm  Renal:  Right: 1-59% stenosis of the right renal artery. RRV flow present.        Cyst(s) noted. Abnormal size for the  right kidney. Normal        size right kidney. Apparent evidence of patent RA stent.        Difficult to completely evaluate kidney size and echogenecity        due to multiple large renal cysts, however it appears to be  on the high end of normal. Left:  Abnormal size for the left kidney. Cyst(s) noted. LRV flow        present. Renal angiography confirmed left RA occlusion. No        flow visualized in the proximal to mid RA. In the hilum there        appears to be flow seen in the RA with normal velocities.        Difficult to completely evaluate kidney size and echogenecity        due to multiple large renal cysts, however it appears to be a        significantly smaller than the right but within normal        limits. Mesenteric:  S/P AAA open repair 2009.  *See table(s) above for measurements and observations.  Diagnosing physician: Leotis Pain MD  Electronically signed by Leotis Pain MD on 08/28/2021 at 9:01:23 AM.    Final       ASSESSMENT & PLAN:  1. Anemia due to stage 4 chronic kidney disease (HCC)    Anemia due to chronic kidney disease. Status post IV Venofer treatments.  200 mg weekly x3. Clinically patient has improved. Hemoglobin has slightly improved as well.  I discussed with patient about erythropoietin therapy and she is not interested. She is also not interested in continued follow-up with [place] for lab monitoring and management. I recommend patient to take ferrous sulfate 325 mg twice daily with vitamin C 500 MCG daily.  Patient has iron tablets at home and will also get OTC vitamin C tablets. She prefers to follow-up with primary care provider. There is no future follow-up appointments scheduled.  Patient says she will call us if she needs to reestablish care.   All questions were answered. The patient knows to call the clinic with any problems questions or concerns.  cc Steele Sizer, MD   Earlie Server, MD, PhD  09/10/2021

## 2021-09-22 ENCOUNTER — Ambulatory Visit (INDEPENDENT_AMBULATORY_CARE_PROVIDER_SITE_OTHER): Payer: Medicare Other | Admitting: Vascular Surgery

## 2021-09-22 ENCOUNTER — Encounter (INDEPENDENT_AMBULATORY_CARE_PROVIDER_SITE_OTHER): Payer: Medicare Other

## 2021-10-06 ENCOUNTER — Telehealth: Payer: Self-pay

## 2021-10-06 NOTE — Progress Notes (Signed)
° ° °  Chronic Care Management Pharmacy Assistant   Name: Betty Nielsen  MRN: [id number] DOB: 09/21/1945  Patient called to be reminded of her telephone appointment with Junius Argyle, CPP on 10/07/2021 @ 1500  Patient aware of appointment date, time, and type of appointment (either telephone or in person). Patient aware to have/bring all medications, supplements, blood pressure and/or blood sugar logs to visit.  Questions: Are there any concerns you would like to discuss during your office visit?   Patient wasn't really sure what your role was, and why you were calling. I did explain that to her but she stated she has never seen you so you may want to ask her if she would like her next appointment to be face-to-face  Star Rating Drug: Atorvastatin 40 mg last filled on 09/30/2021 with [facility]   Any gaps in medications fill history? None  Care Gaps: [place]oster Vaccine COVID-19 Booster [place], CPA/CMA Clinical Pharmacist Assistant Phone: [phone]

## 2021-10-07 ENCOUNTER — Telehealth: Payer: Self-pay

## 2021-10-07 ENCOUNTER — Telehealth: Payer: Medicare Other

## 2021-10-07 NOTE — Telephone Encounter (Signed)
Pt cancelled her appt for today because she does not understand what the appt was for and stated that she has someone from her insurance company that comes out and help with her prescriptions. She tried to contact the number that called her to remind her of this appt and was not able to get through.

## 2021-10-21 ENCOUNTER — Telehealth: Payer: Self-pay

## 2021-10-21 ENCOUNTER — Telehealth: Payer: Medicare Other

## 2021-10-21 NOTE — Telephone Encounter (Signed)
°  Care Management   Follow Up Note   10/21/2021 Name: Betty Nielsen MRN: [id number] DOB: 01/20/1946   Primary Care Provider: Steele Sizer, MD Reason for referral : Chronic Care Management   An unsuccessful telephone outreach was attempted today. The patient was referred to the case management team for assistance with care management and care coordination.   Follow Up Plan:  A HIPAA compliant voice message was left today requesting a return call.   Cristy Friedlander [facility] Care Management [facility] [phone]

## 2021-10-22 ENCOUNTER — Telehealth: Payer: Self-pay | Admitting: *Deleted

## 2021-10-22 NOTE — Telephone Encounter (Signed)
Copied from [place] [id number]. Topic: General - Other >> Oct 22, 2021 11:59 AM [place], Betty Nielsen wrote: Reason for CRM: Pt stated she just missed Nielsen call from the office, no message left, pt wanted to know what the call was regarding, please advise.

## 2021-10-22 NOTE — Chronic Care Management (AMB) (Signed)
°  Care Management   Note  10/22/2021 Name: [name] MRN: [id number] DOB: 04/30/1946  Betty Nielsen is a 76 y.o. year old female who is a primary care patient of Steele Sizer, MD and is actively engaged with the care management team. I reached out to Julio Sicks by phone today to assist with re-scheduling a follow up visit with the RN Case Manager  Follow up plan: Unsuccessful telephone outreach attempt made. A HIPAA compliant phone message was left for the patient providing contact information and requesting a return call.   Julian Hy, [place] Management  Direct Dial: [phone]

## 2021-10-23 NOTE — Chronic Care Management (AMB) (Signed)
°  Care Management   Note  10/23/2021 Name: Betty Nielsen MRN: [id number] DOB: 03/11/1946  Betty Nielsen is a 76 y.o. year old female who is a primary care patient of Steele Sizer, MD and is actively engaged with the care management team. I reached out to Julio Sicks by phone today to assist with re-scheduling a follow up visit with the RN Case Manager  Follow up plan: Patient declines further follow up and engagement by the [facility]. Appropriate care team members and provider have been notified via electronic communication.   Julian Hy, [place] Management  Direct Dial: [phone]

## 2021-10-27 ENCOUNTER — Ambulatory Visit: Payer: Self-pay

## 2021-10-27 DIAGNOSIS — E7849 Other hyperlipidemia: Secondary | ICD-10-CM

## 2021-10-27 NOTE — Chronic Care Management (AMB) (Signed)
Chronic Care Management   CCM RN Visit Note  10/27/2021 Name: Betty Nielsen MRN: [id number] DOB: 10/27/1945  Subjective: Betty Nielsen is a 76 y.o. year old female who is a primary care patient of Betty Sizer, MD.    [place]  Allergies  Allergen Reactions   Ace Inhibitors Cough   Ciprofloxacin Other (See Comments)    unknown   Citalopram     hyponatremia   Hydrocodone-Acetaminophen Other (See Comments)    unknown   Norvasc [Amlodipine Besylate]     Pruritus    Pantoprazole Sodium Other (See Comments)    Cramps   Pravastatin Other (See Comments)    Stomach cramps   Sulfa Antibiotics     unknown   Clonidine Other (See Comments)    Per patient told by allergist that would be best to stop but nephrologist recommended her to stay on medication     Outpatient Encounter Medications as of 10/27/2021  Medication Sig Note   acetaminophen (TYLENOL) 650 MG CR tablet Take 650 mg by mouth every 8 (eight) hours as needed for pain.    aspirin EC 81 MG tablet Take 81 mg by mouth daily. Swallow whole.    atorvastatin (LIPITOR) 40 MG tablet Take 1 tablet (40 mg total) by mouth daily at 6 PM. Cholesterol medication.    cetirizine (ZYRTEC) 10 MG tablet Take 1 tablet (10 mg total) by mouth daily as needed. PRN only    Cholecalciferol 125 MCG (5000 UT) TABS Take 5,000 Units by mouth daily.    clopidogrel (PLAVIX) 75 MG tablet TAKE 1 TABLET BY MOUTH ONCE DAILY. BLOOD THINNER TO HELP REDUCE STROKE RISK    ezetimibe (ZETIA) 10 MG tablet Take 1 tablet (10 mg total) by mouth daily.    hydrochlorothiazide (MICROZIDE) 12.5 MG capsule Take 1 capsule (12.5 mg total) by mouth daily.    labetalol (NORMODYNE) 200 MG tablet Take 1 tablet (200 mg total) by mouth 2 (two) times daily.    levothyroxine (SYNTHROID) 75 MCG tablet Take 1 tablet (75 mcg total) by mouth daily before breakfast.    NIFEdipine (PROCARDIA XL/NIFEDICAL XL) 60 MG 24 hr tablet Take 60 mg by mouth daily.    nitroGLYCERIN  (NITROSTAT) 0.4 MG SL tablet Place under the tongue. (Patient not taking: Reported on 09/09/2021)    Omega-3 Fatty Acids (FISH OIL) 1000 MG CPDR Take 1 capsule by mouth daily.    patiromer (VELTASSA) 8.4 g packet Take 1 packet (8.4 g total) by mouth daily. (Patient not taking: Reported on 08/25/2021) 08/25/2021: Unable to afford.    No facility-administered encounter medications on file as of 10/27/2021.    Patient Active Problem List   Diagnosis Date Noted   Renal artery stenosis ([place]) 07/14/2021   Acute kidney injury superimposed on CKD ([place])    Hyperkalemia    Hypothyroidism    Accelerated hypertension 06/09/2021   Left upper extremity numbness 06/07/2021   Left facial numbness 06/07/2021   Elevated serum creatinine 06/07/2021   CVA (cerebral vascular accident) ([place]) 06/06/2021   Benign hypertension with chronic kidney disease, stage III ([place]) 06/18/2020   Anemia of chronic disease 11/21/2019   Stage 3b chronic kidney disease ([place]) 11/21/2019   Hypo-osmolality and hyponatremia 11/21/2019   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   History of stroke 10/27/2019   PMR (polymyalgia rheumatica) ([place]) 06/15/2017   Headache 10/27/2016   CKD (chronic kidney disease), stage III ([place]) 10/27/2016   Hyperlipidemia 10/27/2016   Hyponatremia 10/27/2016  Angina pectoris ([place]) 10/25/2016   History of non-ST elevation myocardial infarction (NSTEMI) 10/25/2016   History of coronary angioplasty 10/25/2016   BPV (benign positional vertigo) 06/15/2016   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 for follow-up outreach. Reports Betty Nielsen declined. No further nursing outreach required. The CCM Pharmacist will continue to follow for medication needs.   PLAN No further outreach required.   Betty Nielsen [facility] Care Management [facility] [phone]

## 2021-11-24 ENCOUNTER — Ambulatory Visit (INDEPENDENT_AMBULATORY_CARE_PROVIDER_SITE_OTHER): Payer: Medicare Other | Admitting: Nurse Practitioner

## 2021-11-24 ENCOUNTER — Ambulatory Visit (INDEPENDENT_AMBULATORY_CARE_PROVIDER_SITE_OTHER): Payer: Medicare Other

## 2021-11-24 ENCOUNTER — Other Ambulatory Visit: Payer: Self-pay

## 2021-11-24 ENCOUNTER — Encounter (INDEPENDENT_AMBULATORY_CARE_PROVIDER_SITE_OTHER): Payer: Self-pay | Admitting: Nurse Practitioner

## 2021-11-24 ENCOUNTER — Encounter (INDEPENDENT_AMBULATORY_CARE_PROVIDER_SITE_OTHER): Payer: Medicare Other

## 2021-11-24 VITALS — BP 194/83 | HR 66 | Resp 16 | Ht 65.0 in | Wt 177.4 lb

## 2021-11-24 DIAGNOSIS — E7801 Familial hypercholesterolemia: Secondary | ICD-10-CM | POA: Diagnosis not present

## 2021-11-24 DIAGNOSIS — I701 Atherosclerosis of renal artery: Secondary | ICD-10-CM | POA: Diagnosis not present

## 2021-11-24 DIAGNOSIS — I714 Abdominal aortic aneurysm, without rupture, unspecified: Secondary | ICD-10-CM

## 2021-11-24 NOTE — Progress Notes (Signed)
Subjective:    Patient ID: Betty Nielsen, female    DOB: 03-18-1946, 76 y.o.   MRN: [id number] No chief complaint on file.   Patient returns today in follow up of her renal artery stenosis and severe hypertension.  She says her blood pressures been doing much better since she had her renal artery stent placed almost 6 months ago.  She still runs high, in the 952W and 413K systolic and in the 44W diastolic.  Prior to her procedure she was consistently running well over 102 systolic.  She also says that her kidney function has been stable as far she knows.  Duplex today shows her right renal artery stent to be widely patent with an occlusion of the left renal artery.   Review of Systems  Cardiovascular:  Positive for leg swelling.  All other systems reviewed and are negative.     Objective:   Physical Exam Vitals reviewed.  HENT:     Head: Normocephalic.  Cardiovascular:     Rate and Rhythm: Normal rate.     Pulses: Normal pulses.  Pulmonary:     Effort: Pulmonary effort is normal.  Skin:    General: Skin is warm and dry.  Neurological:     Mental Status: She is alert and oriented to person, place, and time.  Psychiatric:        Mood and Affect: Mood normal.        Behavior: Behavior normal.        Thought Content: Thought content normal.        Judgment: Judgment normal.    BP (!) 194/83 (BP Location: Left Arm)    Pulse 66    Resp 16    Ht 5\' 5"  (1.651 m)    Wt 177 lb 6.4 oz (80.5 kg)    BMI 29.52 kg/m   Past Medical History:  Diagnosis Date   Arthrosis of knee    Chronic kidney disease    Hyperlipidemia    Hypertension    Loss of hearing    Metabolic syndrome    Plantar fasciitis    Renal insufficiency     Social History   Socioeconomic History   Marital status: Widowed    Spouse name: Not on file   Number of children: 1   Years of education: GED   Highest education level: 10th grade  Occupational History   Occupation: Retired  Tobacco Use   Smoking  status: Former    Packs/day: 0.50    Years: 20.00    Pack years: 10.00    Types: Cigarettes    Quit date: 2000    Years since quitting: 23.1   Smokeless tobacco: Never   Tobacco comments:    smoking cessation materials not required  Vaping Use   Vaping Use: Never used  Substance and Sexual Activity   Alcohol use: No    Alcohol/week: 0.0 standard drinks   Drug use: No   Sexual activity: Not Currently  Other Topics Concern   Not on file  Social History Narrative   Lives alone   Retired from working a [employer] Strain: Low Risk    Difficulty of Paying Living Expenses: Not hard at all  Food Insecurity: No Food Insecurity   Worried About Charity fundraiser in the Last Year: Never true   Arboriculturist in the Last Year: Never true  Transportation Needs: No Transportation Needs  Lack of Transportation (Medical): No   Lack of Transportation (Non-Medical): No  Physical Activity: Inactive   Days of Exercise per Week: 0 days   Minutes of Exercise per Session: 0 min  Stress: No Stress Concern Present   Feeling of Stress : Only a little  Social Connections: Moderately Isolated   Frequency of Communication with Friends and Family: More than three times a week   Frequency of Social Gatherings with Friends and Family: Once a week   Attends Religious Services: More than 4 times per year   Active Member of [employer] or Organizations: No   Attends Archivist Meetings: Never   Marital Status: Widowed  Human resources officer Violence: Not At Risk   Fear of Current or Ex-Partner: No   Emotionally Abused: No   Physically Abused: No   Sexually Abused: No    Past Surgical History:  Procedure Laterality Date   ABDOMINAL AORTIC ANEURYSM REPAIR     ABDOMINAL HYSTERECTOMY     APPENDECTOMY     CORONARY STENT INTERVENTION N/A 10/26/2016   Procedure: Coronary Stent Intervention;  Surgeon: Yolonda Kida, MD;  Location: [facility];  Service: Cardiovascular;  Laterality: N/A;   HERNIA REPAIR     INNER EAR SURGERY     pt not sure of type   LEFT HEART CATH AND CORONARY ANGIOGRAPHY N/A 10/26/2016   Procedure: Left Heart Cath and Coronary Angiography;  Surgeon: Teodoro Spray, MD;  Location: [facility];  Service: Cardiovascular;  Laterality: N/A;   RENAL ANGIOGRAPHY N/A 06/11/2021   Procedure: RENAL ANGIOGRAPHY;  Surgeon: Algernon Huxley, MD;  Location: [facility];  Service: Cardiovascular;  Laterality: N/A;   THYROIDECTOMY      Family History  Problem Relation Age of Onset   Healthy Mother    Cerebral aneurysm Father     Allergies  Allergen Reactions   Ace Inhibitors Cough   Ciprofloxacin Other (See Comments)    unknown   Citalopram     hyponatremia   Hydrocodone-Acetaminophen Other (See Comments)    unknown   Norvasc [Amlodipine Besylate]     Pruritus    Pantoprazole Sodium Other (See Comments)    Cramps   Pravastatin Other (See Comments)    Stomach cramps   Sulfa Antibiotics     unknown   Clonidine Other (See Comments)    Per patient told by allergist that would be best to stop but nephrologist recommended her to stay on medication     CBC Latest Ref Rng & Units 09/07/2021 07/06/2021 06/17/2021  WBC 4.0 - 10.5 K/uL 6.4 5.2 6.8  Hemoglobin 12.0 - 15.0 g/dL 8.8(L) 8.5(L) 7.6(L)  Hematocrit 36.0 - 46.0 % 27.5(L) 26.1(L) 23.6(L)  Platelets 150 - 400 K/uL 215 213 219      CMP     Component Value Date/Time   NA 131 (L) 07/06/2021 1158   NA 133 (A) 09/26/2018 0000   NA 129 (L) 04/05/2012 0237   K 3.9 07/06/2021 1158   K 3.3 (L) 04/05/2012 0237   CL 100 07/06/2021 1158   CL 94 (L) 04/05/2012 0237   CO2 22 07/06/2021 1158   CO2 25 04/05/2012 0237   GLUCOSE 103 (H) 07/06/2021 1158   GLUCOSE 93 04/05/2012 0237   BUN 36 (H) 07/06/2021 1158   BUN 15 09/26/2018 0000   BUN 9 04/05/2012 0237   CREATININE 3.13 (H) 07/06/2021 1158   CREATININE 1.82 (H) 06/18/2020 1357   CALCIUM  9.1 07/06/2021 1158   CALCIUM 8.6 04/05/2012 0237   PROT 6.5 07/06/2021 1158   PROT 7.7 04/04/2012 1438   ALBUMIN 3.7 07/06/2021 1158   ALBUMIN 3.7 04/04/2012 1438   AST 21 07/06/2021 1158   AST 22 04/04/2012 1438   ALT 12 07/06/2021 1158   ALT 15 04/04/2012 1438   ALKPHOS 64 07/06/2021 1158   ALKPHOS 77 04/04/2012 1438   BILITOT 0.5 07/06/2021 1158   BILITOT 0.4 04/04/2012 1438   GFRNONAA 15 (L) 07/06/2021 1158   GFRNONAA 27 (L) 06/18/2020 1357   GFRAA 31 (L) 06/18/2020 1357     No results found.     Assessment & Plan:   1. Renal artery stenosis (HCC) The patient has elevated blood pressure today however she does note that in preparation for studies she did not take any blood pressure medications.  The patient is status post right renal stent placement in October with a left renal artery occlusion.  She notes that other than today her blood pressure is generally well controlled.  She also notes that her kidney function remained stable.  Patient will continue to follow with her PCP/cardiologist/nephrologist for blood pressure control.  We will plan on seeing the patient back in 6 months with noninvasive studies.  2. Familial hypercholesterolemia Continue statin as ordered and reviewed, no changes at this time   3. 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  I discussed having the patient undergo a CT angio abdomen pelvis for evaluation of the thoracoabdominal aneurysm however the patient does not wish to proceed with more at this time.  She notes that she may be more open to it at her 55-month follow-up.  We will discuss at her follow-up at that time.   Current Outpatient Medications on File Prior to Visit  Medication Sig Dispense Refill   acetaminophen (TYLENOL) 650 MG CR tablet Take 650 mg by mouth every 8 (eight) hours as needed for  pain.     aspirin EC 81 MG tablet Take 81 mg by mouth daily. Swallow whole.     cetirizine (ZYRTEC) 10 MG tablet Take 1 tablet (10 mg total) by mouth daily as needed. PRN only     Cholecalciferol 125 MCG (5000 UT) TABS Take 5,000 Units by mouth daily.     clopidogrel (PLAVIX) 75 MG tablet TAKE 1 TABLET BY MOUTH ONCE DAILY. BLOOD THINNER TO HELP REDUCE STROKE RISK 90 tablet 1   ezetimibe (ZETIA) 10 MG tablet Take 1 tablet (10 mg total) by mouth daily. 90 tablet 1   hydrochlorothiazide (MICROZIDE) 12.5 MG capsule Take 1 capsule (12.5 mg total) by mouth daily. 30 capsule 0   levothyroxine (SYNTHROID) 75 MCG tablet Take 1 tablet (75 mcg total) by mouth daily before breakfast. 90 tablet 1   NIFEdipine (PROCARDIA XL/NIFEDICAL XL) 60 MG 24 hr tablet Take 60 mg by mouth daily.     nitroGLYCERIN (NITROSTAT) 0.4 MG SL tablet Place under the tongue.     Omega-3 Fatty Acids (FISH OIL) 1000 MG CPDR Take 1 capsule by mouth daily.     atorvastatin (LIPITOR) 40 MG tablet Take 1 tablet (40 mg total) by mouth daily at 6 PM. Cholesterol medication. 90 tablet 1   labetalol (NORMODYNE) 200 MG tablet Take 1 tablet (200 mg total) by mouth 2 (two) times daily. 60 tablet 0   patiromer (VELTASSA) 8.4 g packet Take 1  packet (8.4 g total) by mouth daily. (Patient not taking: Reported on 08/25/2021) 30 each 0   No current facility-administered medications on file prior to visit.    There are no Patient Instructions on file for this visit. No follow-ups on file.   Kris Hartmann, NP

## 2021-12-08 ENCOUNTER — Telehealth: Payer: Self-pay

## 2021-12-08 NOTE — Progress Notes (Signed)
? ? ?Chronic Care Management ?Pharmacy Assistant  ? ?Name: Betty Nielsen  MRN: [id number] DOB: Jul 22, 1946 ? ?Reason for Encounter:  General CCM Call ?  ?Recent office visits:  ?08/25/2021 Clemetine Marker, LPN (PCP Clinical Support) for Medicare Wellness Exam- Stopped: Clonidine HCl 0.2 mg Transdermal every 7 days, No orders placed ? ?08/01/2021 Lurlean Leyden, DO [facility] Office Visit) for Acute Sinusitis- Started: Omnicef 300 mg 1 capsule twice daily X 7 days, No orders placed, No follow-up noted ? ?Recent consult visits:  ?11/24/2021 Eulogio Ditch, NP (Vein and Vascular Surgery) for Renal Artery- No medication changes noted, No orders placed, Patient to follow-up in 6 months ? ?10/13/2021 Munsoor Lateef, MD (Nephrology) for Follow-up - Stopped: HCTZ patient not taking due to feeling dizzy, V[place] patient not taking, Lab orders placed, patient to follow-up in 8 weeks ? ?09/09/2021 Earlie Server, MD (Oncology) for Follow-up- No medication changes noted, No orders placed, No follow-up noted ? ?09/01/2021 Munsoor Lateef, MD (Nephrology) for Follow-up- No medication changes noted,Lab Orders Placed, Referral for Renal Treatment Education Placed, patient to follow-up in 6 weeks ? ?08/25/2021 Leotis Pain, MD (Vascular Surgery) for Follow-up- No medication changes noted, VAS US Renal Artery order placed, patient to follow-up in 3 months ? ?07/21/2021 Munsoor Lateef, MD (Nephrology) for Follow-up- No medication changes noted, No orders placed, Patient to follow-up in 6 weeks ? ?07/14/2021 Leotis Pain, MD (Vascular Surgery) for Follow-up- No medication changes noted, VAS US Renal Artery order placed, Patient to follow-up in 1 month ? ?07/13/2021 Munsoor Lateef, MD (Nephrology) for Follow-up- Stopped: Nifedipine due to patient not taking, Lab Order Placed, Patient to follow-up in 4 weeks ? ?Hospital visits:  ?None in previous 6 months ? ?Medications: ?Outpatient Encounter Medications as of 12/08/2021  ?Medication Sig Note  ?  acetaminophen (TYLENOL) 650 MG CR tablet Take 650 mg by mouth every 8 (eight) hours as needed for pain.   ? aspirin EC 81 MG tablet Take 81 mg by mouth daily. Swallow whole.   ? atorvastatin (LIPITOR) 40 MG tablet Take 1 tablet (40 mg total) by mouth daily at 6 PM. Cholesterol medication.   ? cetirizine (ZYRTEC) 10 MG tablet Take 1 tablet (10 mg total) by mouth daily as needed. PRN only   ? Cholecalciferol 125 MCG (5000 UT) TABS Take 5,000 Units by mouth daily.   ? clopidogrel (PLAVIX) 75 MG tablet TAKE 1 TABLET BY MOUTH ONCE DAILY. BLOOD THINNER TO HELP REDUCE STROKE RISK   ? ezetimibe (ZETIA) 10 MG tablet Take 1 tablet (10 mg total) by mouth daily.   ? hydrochlorothiazide (MICROZIDE) 12.5 MG capsule Take 1 capsule (12.5 mg total) by mouth daily.   ? labetalol (NORMODYNE) 200 MG tablet Take 1 tablet (200 mg total) by mouth 2 (two) times daily.   ? levothyroxine (SYNTHROID) 75 MCG tablet Take 1 tablet (75 mcg total) by mouth daily before breakfast.   ? NIFEdipine (PROCARDIA XL/NIFEDICAL XL) 60 MG 24 hr tablet Take 60 mg by mouth daily.   ? nitroGLYCERIN (NITROSTAT) 0.4 MG SL tablet Place under the tongue.   ? Omega-3 Fatty Acids (FISH OIL) 1000 MG CPDR Take 1 capsule by mouth daily.   ? patiromer (VELTASSA) 8.4 g packet Take 1 packet (8.4 g total) by mouth daily. (Patient not taking: Reported on 08/25/2021) 08/25/2021: Unable to afford.   ? ?No facility-administered encounter medications on file as of 12/08/2021.  ? ?Care Gaps: ?Zoster Vaccine ?COVID-19 Vaccine Booster 4 ? ?Star Rating Drugs: ?Atorvastatin 40 mg  last filled on 09/30/2021 for a 90-Day supply with [facility] ? ?I contacted the patient to do her monthly call, and per patient she would like to be unenrolled from [place] at this time with Junius Argyle, CPP. Patient stated she has plenty of appointments at this time with providers. Patient reports she is doing well and denies needing anything. ? ?CCP has been notified of patient's wishes. ? ?Lynann Bologna, CPA/CMA ?Clinical Pharmacist Assistant ?Phone: [phone]  ?  ? ? ?

## 2021-12-16 ENCOUNTER — Other Ambulatory Visit: Payer: Self-pay | Admitting: Family Medicine

## 2021-12-16 DIAGNOSIS — Z9889 Other specified postprocedural states: Secondary | ICD-10-CM

## 2022-01-01 NOTE — Progress Notes (Signed)
Name: Betty Nielsen   MRN: [id number]    DOB: 1946/03/05   Date:01/04/2022 ? ?     Progress Note ? ?Subjective ? ?Chief Complaint ? ?Follow Up ? ?HPI ? ?CKI III/ Hyponatremia: she is under the care of  [facility], she was hospitalized end of 05/2021 and developed acute on chronic kidney disease,GFR was as low as 7 but in the teens range for a while now. . She had a renal artery stenosis, status post stent placement and Dr. Holley Raring thinks drop in kidney function likely multifactorial , HCT still down to 25.5 and was referred to hematologist  She saw Dr. Tasia Catchings back in Dec 2022 , diagnosed with iron deficiency and also anemia of chronic disease, she was advised to take ferrous sulfate ( patient stopped due to hot flashes) and offered erythropoietin therapy but she refused  ?  ?HTN/CAD/Angina/Stroke: bp today is in good control, taking bp medications as prescribed,  palpitation or dizziness, no recent episodes of chest pain .  She has not used any NTG since Feb 2018 ( when she had MI). She had a Stroke 10/2019 , she is back on  plavix and is taking statin therapy plus zetia,  ?  ?Atherosclerosis abdominal aorta/hyperlipidemia: she has been taking Atorvastatin and plavix, no longer having problems with statin therapy,  history of aneurysmal of aorta repair done by Dr. Doren Custard ( vascular surgeon in [place]), also had stent placed on right renal artery 05/2021, she recently went to see vascular surgeon but refused repeat CT angiogram but will go back for a 6 month follow up and re-consider.  ?  ?Hyperlipidemia: since 10/2019 she has been taking Atorvastatin and denies side effects of medication last LDL was 124 . Explained goal is below 70 but she is not interested in switching medications  ?  ?Hypothyroidism: she is s/p thyroidectomy Compliant with medication, denies dry skin, states constipation under control with dietary modification and increase in fiber diet. . Last TSH was  2.46 , she is taking Synthroid 75 mcg daily  . Recheck level yearly  ?  ?Depression: she is off SSRI because of hyponatremia, but states she is feeling well, in remission. 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 She denies sadness, crying spells, change in appetite, or suicidal thoughts. Phq 9 today is zero, she has been in remission Unchanged  ? ?Metabolic syndrome: Last K2I was 5.7 %. She denies polyphagia, polydipsia or polyuria , we will recheck it yearly  ? ?AR: she has year round symptoms but worse in the Spring, currently watery eyes, sneezing, also has rhinorrhea. She sees ENT/ Dr. Pryor Ochoa. She states could not take allergy pill he prescribed because it made her feel anxious. Afraid to try another medication ? ? ?Patient Active Problem List  ? Diagnosis Date Noted  ? Renal artery stenosis ([place]) 07/14/2021  ? Acute kidney injury superimposed on CKD ([place])   ? Hyperkalemia   ? Hypothyroidism   ? Accelerated hypertension 06/09/2021  ? Left upper extremity numbness 06/07/2021  ? Left facial numbness 06/07/2021  ? Elevated serum creatinine 06/07/2021  ? CVA (cerebral vascular accident) ([place]) 06/06/2021  ? Benign hypertension with chronic kidney disease, stage III ([place]) 06/18/2020  ? Anemia of chronic disease 11/21/2019  ? Stage 3b chronic kidney disease ([place]) 11/21/2019  ? Hypo-osmolality and hyponatremia 11/21/2019  ? Secondary hyperparathyroidism of renal origin ([place]) 11/21/2019  ? History of stroke 10/27/2019  ? PMR (polymyalgia rheumatica) ([place]) 06/15/2017  ? Headache 10/27/2016  ?  CKD (chronic kidney disease), stage III ([place]) 10/27/2016  ? Hyperlipidemia 10/27/2016  ? Hyponatremia 10/27/2016  ? Angina pectoris ([place]) 10/25/2016  ? History of non-ST elevation myocardial infarction (NSTEMI) 10/25/2016  ? History of coronary angioplasty 10/25/2016  ? BPV (benign positional vertigo) 06/15/2016  ? 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[place]) 11/21/2015  ? Acid reflux 06/05/2015  ? Benign secondary hypertension due to renal artery stenosis ([place]) 06/05/2015  ?  Hypothyroidism, 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  ? Major depression in remission ([place]) 06/05/2015  ? Obesity (BMI 30.0-34.9) 06/05/2015  ? Neuropathy 06/05/2015  ? Perennial allergic rhinitis with seasonal variation 06/05/2015  ? Arthritis, degenerative 07/03/2014  ? AAA (abdominal aortic aneurysm) without rupture ([place]) 06/26/2014  ? ? ?Past Surgical History:  ?Procedure Laterality Date  ? ABDOMINAL AORTIC ANEURYSM REPAIR    ? ABDOMINAL HYSTERECTOMY    ? APPENDECTOMY    ? CORONARY STENT INTERVENTION N/A 10/26/2016  ? Procedure: Coronary Stent Intervention;  Surgeon: Yolonda Kida, MD;  Location: [facility];  Service: Cardiovascular;  Laterality: N/A;  ? HERNIA REPAIR    ? INNER EAR SURGERY    ? pt not sure of type  ? LEFT HEART CATH AND CORONARY ANGIOGRAPHY N/A 10/26/2016  ? Procedure: Left Heart Cath and Coronary Angiography;  Surgeon: Teodoro Spray, MD;  Location: [facility];  Service: Cardiovascular;  Laterality: N/A;  ? RENAL ANGIOGRAPHY N/A 06/11/2021  ? Procedure: RENAL ANGIOGRAPHY;  Surgeon: Algernon Huxley, MD;  Location: [facility];  Service: Cardiovascular;  Laterality: N/A;  ? THYROIDECTOMY    ? ? ?Family History  ?Problem Relation Age of Onset  ? Healthy Mother   ? Cerebral aneurysm Father   ? ? ?Social History  ? ?Tobacco Use  ? Smoking status: Former  ?  Packs/day: 0.50  ?  Years: 20.00  ?  Pack years: 10.00  ?  Types: Cigarettes  ?  Quit date: 2000  ?  Years since quitting: 23.3  ? Smokeless tobacco: Never  ? Tobacco comments:  ?  smoking cessation materials not required  ?Substance Use Topics  ? Alcohol use: No  ?  Alcohol/week: 0.0 standard drinks  ? ? ? ?Current Outpatient Medications:  ?  acetaminophen (TYLENOL) 650 MG CR tablet, Take 650 mg by mouth every 8 (eight) hours as needed for pain., Disp: , Rfl:  ?  aspirin EC 81 MG tablet, Take 81 mg by mouth daily. Swallow whole., Disp: , Rfl:  ?  cetirizine (ZYRTEC) 10 MG tablet, Take 1 tablet (10 mg total) by mouth daily as needed.  PRN only, Disp: , Rfl:  ?  Cholecalciferol 125 MCG (5000 UT) TABS, Take 5,000 Units by mouth daily., Disp: , Rfl:  ?  cloNIDine (CATAPRES) 0.2 MG tablet, Take 0.2 mg by mouth 2 (two) times daily., Disp: , Rfl:  ?  clopidogrel (PLAVIX) 75 MG tablet, TAKE 1 TABLET BY MOUTH ONCE DAILY. BLOOD THINNER TO HELP REDUCE STROKE RISK, Disp: 90 tablet, Rfl: 1 ?  ezetimibe (ZETIA) 10 MG tablet, Take 1 tablet by mouth once daily, Disp: 90 tablet, Rfl: 0 ?  hydrochlorothiazide (MICROZIDE) 12.5 MG capsule, Take 1 capsule (12.5 mg total) by mouth daily., Disp: 30 capsule, Rfl: 0 ?  levothyroxine (SYNTHROID) 75 MCG tablet, Take 1 tablet (75 mcg total) by mouth daily before breakfast., Disp: 90 tablet, Rfl: 1 ?  NIFEdipine (PROCARDIA XL/NIFEDICAL XL) 60 MG 24 hr tablet, Take 60 mg by mouth daily., Disp: , Rfl:  ?  nitroGLYCERIN (NITROSTAT) 0.4 MG SL tablet, Place under the tongue., Disp: , Rfl:  ?  Omega-3 Fatty Acids (FISH OIL) 1000 MG CPDR, Take 1 capsule by mouth daily., Disp: , Rfl:  ?  patiromer (VELTASSA) 8.4 g packet, Take 1 packet (8.4 g total) by mouth daily., Disp: 30 each, Rfl: 0 ?  atorvastatin (LIPITOR) 40 MG tablet, Take 1 tablet (40 mg total) by mouth daily at 6 PM. Cholesterol medication., Disp: 90 tablet, Rfl: 1 ?  labetalol (NORMODYNE) 200 MG tablet, Take 1 tablet (200 mg total) by mouth 2 (two) times daily., Disp: 60 tablet, Rfl: 0 ? ?Allergies  ?Allergen Reactions  ? Ace Inhibitors Cough  ? Ciprofloxacin Other (See Comments)  ?  unknown  ? Citalopram   ?  hyponatremia  ? Hydrocodone-Acetaminophen Other (See Comments)  ?  unknown  ? Norvasc [Amlodipine Besylate]   ?  Pruritus ?  ? Pantoprazole Sodium Other (See Comments)  ?  Cramps  ? Pravastatin Other (See Comments)  ?  Stomach cramps  ? Sulfa Antibiotics   ?  unknown  ? Clonidine Other (See Comments)  ?  Per patient told by allergist that would be best to stop but nephrologist recommended her to stay on medication   ? ? ?I personally reviewed active problem list,  medication list, allergies, family history, social history, health maintenance with the patient/caregiver today. ? ? ?ROS ? ?Constitutional: Negative for fever or weight change.  ?Respiratory: Negat

## 2022-01-04 ENCOUNTER — Encounter: Payer: Self-pay | Admitting: Family Medicine

## 2022-01-04 ENCOUNTER — Ambulatory Visit (INDEPENDENT_AMBULATORY_CARE_PROVIDER_SITE_OTHER): Payer: Medicare Other | Admitting: Family Medicine

## 2022-01-04 VITALS — BP 132/72 | HR 76 | Resp 16 | Ht 65.0 in | Wt 178.0 lb

## 2022-01-04 DIAGNOSIS — N189 Chronic kidney disease, unspecified: Secondary | ICD-10-CM

## 2022-01-04 DIAGNOSIS — E890 Postprocedural hypothyroidism: Secondary | ICD-10-CM

## 2022-01-04 DIAGNOSIS — I209 Angina pectoris, unspecified: Secondary | ICD-10-CM | POA: Diagnosis not present

## 2022-01-04 DIAGNOSIS — J3089 Other allergic rhinitis: Secondary | ICD-10-CM

## 2022-01-04 DIAGNOSIS — I700 Atherosclerosis of aorta: Secondary | ICD-10-CM | POA: Diagnosis not present

## 2022-01-04 DIAGNOSIS — N2581 Secondary hyperparathyroidism of renal origin: Secondary | ICD-10-CM

## 2022-01-04 DIAGNOSIS — N184 Chronic kidney disease, stage 4 (severe): Secondary | ICD-10-CM

## 2022-01-04 DIAGNOSIS — J302 Other seasonal allergic rhinitis: Secondary | ICD-10-CM

## 2022-01-04 DIAGNOSIS — Z9889 Other specified postprocedural states: Secondary | ICD-10-CM

## 2022-01-04 DIAGNOSIS — E785 Hyperlipidemia, unspecified: Secondary | ICD-10-CM

## 2022-01-04 DIAGNOSIS — Z8679 Personal history of other diseases of the circulatory system: Secondary | ICD-10-CM

## 2022-01-04 DIAGNOSIS — Z8673 Personal history of transient ischemic attack (TIA), and cerebral infarction without residual deficits: Secondary | ICD-10-CM

## 2022-01-04 DIAGNOSIS — D631 Anemia in chronic kidney disease: Secondary | ICD-10-CM

## 2022-01-04 DIAGNOSIS — F325 Major depressive disorder, single episode, in full remission: Secondary | ICD-10-CM

## 2022-01-04 DIAGNOSIS — D508 Other iron deficiency anemias: Secondary | ICD-10-CM

## 2022-01-12 ENCOUNTER — Other Ambulatory Visit: Payer: Self-pay | Admitting: Family Medicine

## 2022-01-12 DIAGNOSIS — I700 Atherosclerosis of aorta: Secondary | ICD-10-CM

## 2022-01-12 DIAGNOSIS — I209 Angina pectoris, unspecified: Secondary | ICD-10-CM

## 2022-01-12 DIAGNOSIS — Z8673 Personal history of transient ischemic attack (TIA), and cerebral infarction without residual deficits: Secondary | ICD-10-CM

## 2022-01-15 ENCOUNTER — Other Ambulatory Visit: Payer: Self-pay | Admitting: Family Medicine

## 2022-01-15 DIAGNOSIS — E890 Postprocedural hypothyroidism: Secondary | ICD-10-CM

## 2022-03-20 ENCOUNTER — Other Ambulatory Visit: Payer: Self-pay | Admitting: Family Medicine

## 2022-03-20 DIAGNOSIS — Z9889 Other specified postprocedural states: Secondary | ICD-10-CM

## 2022-05-25 ENCOUNTER — Other Ambulatory Visit (INDEPENDENT_AMBULATORY_CARE_PROVIDER_SITE_OTHER): Payer: Self-pay | Admitting: Nurse Practitioner

## 2022-05-25 DIAGNOSIS — I701 Atherosclerosis of renal artery: Secondary | ICD-10-CM

## 2022-05-27 ENCOUNTER — Ambulatory Visit (INDEPENDENT_AMBULATORY_CARE_PROVIDER_SITE_OTHER): Payer: Medicare Other

## 2022-05-27 ENCOUNTER — Encounter (INDEPENDENT_AMBULATORY_CARE_PROVIDER_SITE_OTHER): Payer: Self-pay | Admitting: Nurse Practitioner

## 2022-05-27 ENCOUNTER — Ambulatory Visit (INDEPENDENT_AMBULATORY_CARE_PROVIDER_SITE_OTHER): Payer: Medicare Other | Admitting: Nurse Practitioner

## 2022-05-27 VITALS — BP 213/84 | HR 62 | Resp 17 | Ht 63.0 in | Wt 176.0 lb

## 2022-05-27 DIAGNOSIS — I714 Abdominal aortic aneurysm, without rupture, unspecified: Secondary | ICD-10-CM

## 2022-05-27 DIAGNOSIS — E7801 Familial hypercholesterolemia: Secondary | ICD-10-CM

## 2022-05-27 DIAGNOSIS — I701 Atherosclerosis of renal artery: Secondary | ICD-10-CM

## 2022-05-27 NOTE — Progress Notes (Signed)
Subjective:    Patient ID: Betty Nielsen, female    DOB: [birth year], 76 y.o.   MRN: [id number] No chief complaint on file.   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  Today the patient has not had her blood pressure medication runs in the 200s.  The patient notes that she has been having some more elevated blood pressures recently.  It is also noted that with a recent visit with her nephrologist her kidney function has continued to worsen.  The patient had a recent renal duplex today which notes a 1 to 59% stenosis in the right renal artery.  The stent is patent.  However the patient has a known left renal artery occlusion.  The velocities are more elevated than they were at her previous scan 6 months ago.    Review of Systems  Musculoskeletal:  Positive for arthralgias.  All other systems reviewed and are negative.      Objective:   Physical Exam Vitals reviewed.  HENT:     Head: Normocephalic.  Cardiovascular:     Rate and Rhythm: Normal rate.     Pulses: Normal pulses.  Pulmonary:     Effort: Pulmonary effort is normal.  Skin:    General: Skin is warm and dry.  Neurological:     Mental Status: She is alert and oriented to person, place, and time.     Gait: Gait abnormal.  Psychiatric:        Mood and Affect: Mood normal.        Behavior: Behavior normal.        Thought Content: Thought content normal.        Judgment: Judgment normal.     BP (!) 213/84 (BP Location: Right Arm)   Pulse 62   Resp 17   Ht 5\' 3"  (1.6 m)   Wt 176 lb (79.8 kg)   BMI 31.18 kg/m   Past Medical History:  Diagnosis Date   Arthrosis of knee    Chronic kidney disease    Hyperlipidemia    Hypertension    Loss of hearing    Metabolic syndrome    Plantar fasciitis    Renal insufficiency     Social History   Socioeconomic History   Marital status: Widowed    Spouse name: Not on file   Number of children: 1    Years of education: GED   Highest education level: 10th grade  Occupational History   Occupation: Retired  Tobacco Use   Smoking status: Former    Packs/day: 0.50    Years: 20.00    Total pack years: 10.00    Types: Cigarettes    Quit date: 2000    Years since quitting: 23.6   Smokeless tobacco: Never   Tobacco comments:    smoking cessation materials not required  Vaping Use   Vaping Use: Never used  Substance and Sexual Activity   Alcohol use: No    Alcohol/week: 0.0 standard drinks of alcohol   Drug use: No   Sexual activity: Not Currently  Other Topics Concern   Not on file  Social History Narrative   Lives alone   Retired from working a [employer] Strain: [place]  (08/25/2021)   Overall Financial Resource Strain (CARDIA)    Difficulty of Paying Living Expenses: Not hard at all  Food Insecurity: No Food Insecurity (  08/25/2021)   Hunger Vital Sign    Worried About Running Out of Food in the Last Year: Never true    Mission in the Last Year: Never true  Transportation Needs: No Transportation Needs (08/25/2021)   PRAPARE - Hydrologist (Medical): No    Lack of Transportation (Non-Medical): No  Physical Activity: Inactive (08/25/2021)   Exercise Vital Sign    Days of Exercise per Week: 0 days    Minutes of Exercise per Session: 0 min  Stress: No Stress Concern Present (08/25/2021)   [place]    Feeling of Stress : Only a little  Social Connections: Moderately Isolated (08/25/2021)   Social Connection and Isolation Panel [NHANES]    Frequency of Communication with Friends and Family: More than three times a week    Frequency of Social Gatherings with Friends and Family: Once a week    Attends Religious Services: More than 4 times per year    Active Member of [employer] or Organizations: No    Attends Archivist  Meetings: Never    Marital Status: Widowed  Intimate Partner Violence: Not At Risk (08/25/2021)   Humiliation, Afraid, Rape, and Kick questionnaire    Fear of Current or Ex-Partner: No    Emotionally Abused: No    Physically Abused: No    Sexually Abused: No    Past Surgical History:  Procedure Laterality Date   ABDOMINAL AORTIC ANEURYSM REPAIR     ABDOMINAL HYSTERECTOMY     APPENDECTOMY     CORONARY STENT INTERVENTION N/A 10/26/2016   Procedure: Coronary Stent Intervention;  Surgeon: Yolonda Kida, MD;  Location: [facility];  Service: Cardiovascular;  Laterality: N/A;   HERNIA REPAIR     INNER EAR SURGERY     pt not sure of type   LEFT HEART CATH AND CORONARY ANGIOGRAPHY N/A 10/26/2016   Procedure: Left Heart Cath and Coronary Angiography;  Surgeon: Teodoro Spray, MD;  Location: [facility];  Service: Cardiovascular;  Laterality: N/A;   RENAL ANGIOGRAPHY N/A 06/11/2021   Procedure: RENAL ANGIOGRAPHY;  Surgeon: Algernon Huxley, MD;  Location: [facility];  Service: Cardiovascular;  Laterality: N/A;   THYROIDECTOMY      Family History  Problem Relation Age of Onset   Healthy Mother    Cerebral aneurysm Father     Allergies  Allergen Reactions   Ace Inhibitors Cough   Ciprofloxacin Other (See Comments)    unknown   Citalopram     hyponatremia   Hydrocodone-Acetaminophen Other (See Comments)    unknown   Norvasc [Amlodipine Besylate]     Pruritus    Pantoprazole Sodium Other (See Comments)    Cramps   Pravastatin Other (See Comments)    Stomach cramps   Sulfa Antibiotics     unknown   Clonidine Other (See Comments)    Per patient told by allergist that would be best to stop but nephrologist recommended her to stay on medication        Latest Ref Rng & Units 09/07/2021    1:20 PM 07/06/2021   11:58 AM 06/17/2021    5:14 AM  CBC  WBC 4.0 - 10.5 K/uL 6.4  5.2  6.8   Hemoglobin 12.0 - 15.0 g/dL 8.8  8.5  7.6   Hematocrit 36.0 - 46.0 %  27.5  26.1  23.6   Platelets 150 -  400 K/uL 215  213  219       CMP     Component Value Date/Time   NA 131 (L) 07/06/2021 1158   NA 133 (A) 09/26/2018 0000   NA 129 (L) 04/05/2012 0237   K 3.9 07/06/2021 1158   K 3.3 (L) 04/05/2012 0237   CL 100 07/06/2021 1158   CL 94 (L) 04/05/2012 0237   CO2 22 07/06/2021 1158   CO2 25 04/05/2012 0237   GLUCOSE 103 (H) 07/06/2021 1158   GLUCOSE 93 04/05/2012 0237   BUN 36 (H) 07/06/2021 1158   BUN 15 09/26/2018 0000   BUN 9 04/05/2012 0237   CREATININE 3.13 (H) 07/06/2021 1158   CREATININE 1.82 (H) 06/18/2020 1357   CALCIUM 9.1 07/06/2021 1158   CALCIUM 8.6 04/05/2012 0237   PROT 6.5 07/06/2021 1158   PROT 7.7 04/04/2012 1438   ALBUMIN 3.7 07/06/2021 1158   ALBUMIN 3.7 04/04/2012 1438   AST 21 07/06/2021 1158   AST 22 04/04/2012 1438   ALT 12 07/06/2021 1158   ALT 15 04/04/2012 1438   ALKPHOS 64 07/06/2021 1158   ALKPHOS 77 04/04/2012 1438   BILITOT 0.5 07/06/2021 1158   BILITOT 0.4 04/04/2012 1438   GFRNONAA 15 (L) 07/06/2021 1158   GFRNONAA 27 (L) 06/18/2020 1357   GFRAA 31 (L) 06/18/2020 1357     No results found.     Assessment & Plan:   1. Renal artery stenosis (HCC) Today the patient's blood pressure was extremely elevated even without medication.  It is noted that a recent visit by her nephrologist notes that her renal function has been worsening and her GFR is approximately 14.  Given this, I have suspicion that her stent may be narrowing and she may need intervention at the very least closer evaluation.  I discussed this with the patient and recommended that she undergo a renal angiogram given her solitary kidney.  However at this time she does not wish to move forward with any intervention.  We discussed the risk the benefits and the alternatives.  We also discussed what may happen if her stent is narrowed and she elects to proceed without intervention.  Following this conversation patient continues to not wish to move  forward.  We discussed a closer follow-up of 3 months versus 6 but the patient also did not wish to have a closer follow-up.  She was agreeable to a 14-month follow-up. - VAS US RENAL ARTERY DUPLEX  2. Abdominal aortic aneurysm (AAA) without rupture, unspecified part ([place]) Has had a previous abdominal aortic aneurysm repair but appears to have some thoracoabdominal aneurysm component as well.  A CT angiogram to further explore a thoracoabdominal aneurysm was offered to the patient.  At this time the patient continues not to move forward with any further evaluation of this.  3. Familial hypercholesterolemia Continue statin as ordered and reviewed, no changes at this time    Current Outpatient Medications on File Prior to Visit  Medication Sig Dispense Refill   acetaminophen (TYLENOL) 650 MG CR tablet Take 650 mg by mouth every 8 (eight) hours as needed for pain.     aspirin EC 81 MG tablet Take 81 mg by mouth daily. Swallow whole.     atorvastatin (LIPITOR) 40 MG tablet TAKE 1 TABLET BY MOUTH ONCE DAILY AT  6PM.  CHOLESTEROL  MEDICATION. 90 tablet 1   cetirizine (ZYRTEC) 10 MG tablet Take 1 tablet (10 mg total) by mouth daily as needed. PRN only  Cholecalciferol 125 MCG (5000 UT) TABS Take 5,000 Units by mouth daily.     cloNIDine (CATAPRES) 0.2 MG tablet Take 0.2 mg by mouth 2 (two) times daily.     clopidogrel (PLAVIX) 75 MG tablet TAKE 1 TABLET BY MOUTH ONCE DAILY. BLOOD THINNER TO HELP REDUCE STROKE RISK 90 tablet 1   ezetimibe (ZETIA) 10 MG tablet Take 1 tablet by mouth once daily 90 tablet 1   levothyroxine (SYNTHROID) 75 MCG tablet TAKE 1 TABLET BY MOUTH ONCE DAILY BEFORE BREAKFAST 90 tablet 1   nitroGLYCERIN (NITROSTAT) 0.4 MG SL tablet Place under the tongue.     Omega-3 Fatty Acids (FISH OIL) 1000 MG CPDR Take 1 capsule by mouth daily.     polyethylene glycol (MIRALAX / GLYCOLAX) 17 g packet Take by mouth.     labetalol (NORMODYNE) 200 MG tablet Take 1 tablet (200 mg total) by  mouth 2 (two) times daily. 60 tablet 0   No current facility-administered medications on file prior to visit.    There are no Patient Instructions on file for this visit. No follow-ups on file.   Kris Hartmann, NP

## 2022-07-05 NOTE — Progress Notes (Unsigned)
Name: Betty Nielsen   MRN: [id number]    DOB: 08-Nov-1945   Date:07/05/2022       Progress Note  Subjective  Chief Complaint  Follow up   HPI  CKI III/ Hyponatremia: she is under the care of  [facility], she was hospitalized end of 05/2021 and developed acute on chronic kidney disease,GFR was as low as 7 but in the teens range for a while now. . She had a renal artery stenosis, status post stent placement and Dr. Holley Raring thinks drop in kidney function likely multifactorial , HCT still down to 25.5 and was referred to hematologist  She saw Dr. Tasia Catchings back in Dec 2022 , diagnosed with iron deficiency and also anemia of chronic disease, she was advised to take ferrous sulfate ( patient stopped due to hot flashes) and offered erythropoietin therapy but she refused    HTN/CAD/Angina/Stroke: bp today is in good control, taking bp medications as prescribed,  palpitation or dizziness, no recent episodes of chest pain .  She has not used any NTG since Feb 2018 ( when she had MI). She had a Stroke 10/2019 , she is back on  plavix and is taking statin therapy plus zetia,    Atherosclerosis abdominal aorta/hyperlipidemia: she has been taking Atorvastatin and plavix, no longer having problems with statin therapy,  history of aneurysmal of aorta repair done by Dr. Doren Custard ( vascular surgeon in [place]), also had stent placed on right renal artery 05/2021, she recently went to see vascular surgeon but refused repeat CT angiogram but will go back for a 6 month follow up and re-consider.    Hyperlipidemia: since 10/2019 she has been taking Atorvastatin and denies side effects of medication last LDL was 124 . Explained goal is below 64 but she is not interested in switching medications    Hypothyroidism: she is s/p thyroidectomy Compliant with medication, denies dry skin, states constipation under control with dietary modification and increase in fiber diet. . Last TSH was  2.46 , she is taking Synthroid 75 mcg  daily . Recheck level yearly    Depression: she is off SSRI because of hyponatremia, but states she is feeling well, in remission. 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 She denies sadness, crying spells, change in appetite, or suicidal thoughts. Phq 9 today is zero, she has been in remission Unchanged   Metabolic syndrome: Last O7F was 5.7 %. She denies polyphagia, polydipsia or polyuria , we will recheck it yearly   AR: she has year round symptoms but worse in the Spring, currently watery eyes, sneezing, also has rhinorrhea. She sees ENT/ Dr. Pryor Ochoa. She states could not take allergy pill he prescribed because it made her feel anxious. Afraid to try another medication  Patient Active Problem List   Diagnosis Date Noted   Renal artery stenosis ([place]) 07/14/2021   Acute kidney injury superimposed on CKD ([place])    Hyperkalemia    Hypothyroidism    Accelerated hypertension 06/09/2021   Left upper extremity numbness 06/07/2021   Left facial numbness 06/07/2021   Elevated serum creatinine 06/07/2021   CVA (cerebral vascular accident) ([place]) 06/06/2021   Benign hypertension with chronic kidney disease, stage III ([place]) 06/18/2020   Anemia of chronic disease 11/21/2019   Stage 3b chronic kidney disease ([place]) 11/21/2019   Hypo-osmolality and hyponatremia 11/21/2019   Secondary hyperparathyroidism of renal origin ([place]) 11/21/2019   History of stroke 10/27/2019   PMR (polymyalgia rheumatica) ([place]) 06/15/2017   Headache 10/27/2016  CKD (chronic kidney disease), stage III ([place]) 10/27/2016   Hyperlipidemia 10/27/2016   Hyponatremia 10/27/2016   Angina pectoris ([place]) 10/25/2016   History of non-ST elevation myocardial infarction (NSTEMI) 10/25/2016   History of coronary angioplasty 10/25/2016   BPV (benign positional vertigo) 06/15/2016   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[place]) 11/21/2015   Acid reflux 06/05/2015   Benign secondary hypertension due to renal artery stenosis ([place]) 06/05/2015    Hypothyroidism, 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   Major depression in remission ([place]) 06/05/2015   Obesity (BMI 30.0-34.9) 06/05/2015   Neuropathy 06/05/2015   Perennial allergic rhinitis with seasonal variation 06/05/2015   Arthritis, degenerative 07/03/2014   AAA (abdominal aortic aneurysm) without rupture ([place]) 06/26/2014    Past Surgical History:  Procedure Laterality Date   ABDOMINAL AORTIC ANEURYSM REPAIR     ABDOMINAL HYSTERECTOMY     APPENDECTOMY     CORONARY STENT INTERVENTION N/A 10/26/2016   Procedure: Coronary Stent Intervention;  Surgeon: Yolonda Kida, MD;  Location: [facility];  Service: Cardiovascular;  Laterality: N/A;   HERNIA REPAIR     INNER EAR SURGERY     pt not sure of type   LEFT HEART CATH AND CORONARY ANGIOGRAPHY N/A 10/26/2016   Procedure: Left Heart Cath and Coronary Angiography;  Surgeon: Teodoro Spray, MD;  Location: [facility];  Service: Cardiovascular;  Laterality: N/A;   RENAL ANGIOGRAPHY N/A 06/11/2021   Procedure: RENAL ANGIOGRAPHY;  Surgeon: Algernon Huxley, MD;  Location: [facility];  Service: Cardiovascular;  Laterality: N/A;   THYROIDECTOMY      Family History  Problem Relation Age of Onset   Healthy Mother    Cerebral aneurysm Father     Social History   Tobacco Use   Smoking status: Former    Packs/day: 0.50    Years: 20.00    Total pack years: 10.00    Types: Cigarettes    Quit date: 2000    Years since quitting: 23.8   Smokeless tobacco: Never   Tobacco comments:    smoking cessation materials not required  Substance Use Topics   Alcohol use: No    Alcohol/week: 0.0 standard drinks of alcohol     Current Outpatient Medications:    acetaminophen (TYLENOL) 650 MG CR tablet, Take 650 mg by mouth every 8 (eight) hours as needed for pain., Disp: , Rfl:    aspirin EC 81 MG tablet, Take 81 mg by mouth daily. Swallow whole., Disp: , Rfl:    atorvastatin (LIPITOR) 40 MG tablet, TAKE 1 TABLET BY MOUTH ONCE  DAILY AT  6PM.  CHOLESTEROL  MEDICATION., Disp: 90 tablet, Rfl: 1   cetirizine (ZYRTEC) 10 MG tablet, Take 1 tablet (10 mg total) by mouth daily as needed. PRN only, Disp: , Rfl:    Cholecalciferol 125 MCG (5000 UT) TABS, Take 5,000 Units by mouth daily., Disp: , Rfl:    cloNIDine (CATAPRES) 0.2 MG tablet, Take 0.2 mg by mouth 2 (two) times daily., Disp: , Rfl:    clopidogrel (PLAVIX) 75 MG tablet, TAKE 1 TABLET BY MOUTH ONCE DAILY. BLOOD THINNER TO HELP REDUCE STROKE RISK, Disp: 90 tablet, Rfl: 1   ezetimibe (ZETIA) 10 MG tablet, Take 1 tablet by mouth once daily, Disp: 90 tablet, Rfl: 1   labetalol (NORMODYNE) 200 MG tablet, Take 1 tablet (200 mg total) by mouth 2 (two) times daily., Disp: 60 tablet, Rfl: 0   levothyroxine (SYNTHROID) 75 MCG tablet, TAKE 1 TABLET BY MOUTH ONCE DAILY  BEFORE BREAKFAST, Disp: 90 tablet, Rfl: 1   nitroGLYCERIN (NITROSTAT) 0.4 MG SL tablet, Place under the tongue., Disp: , Rfl:    Omega-3 Fatty Acids (FISH OIL) 1000 MG CPDR, Take 1 capsule by mouth daily., Disp: , Rfl:    polyethylene glycol (MIRALAX / GLYCOLAX) 17 g packet, Take by mouth., Disp: , Rfl:   Allergies  Allergen Reactions   Ace Inhibitors Cough   Ciprofloxacin Other (See Comments)    unknown   Citalopram     hyponatremia   Hydrocodone-Acetaminophen Other (See Comments)    unknown   Norvasc [Amlodipine Besylate]     Pruritus    Pantoprazole Sodium Other (See Comments)    Cramps   Pravastatin Other (See Comments)    Stomach cramps   Sulfa Antibiotics     unknown   Clonidine Other (See Comments)    Per patient told by allergist that would be best to stop but nephrologist recommended her to stay on medication     I personally reviewed active problem list, medication list, allergies, family history, social history, health maintenance with the patient/caregiver today.   ROS  ***  Objective  There were no vitals filed for this visit.  There is no height or weight on file to calculate  BMI.  Physical Exam ***  No results found for this or any previous visit (from the past 2160 hour(s)).   PHQ2/9:    01/04/2022    2:13 PM 08/25/2021    3:41 PM 07/02/2021    1:37 PM 07/01/2021    2:45 PM 06/19/2021    8:56 AM  Depression screen PHQ 2/9  Decreased Interest 0 0 0 0 0  Down, Depressed, Hopeless 0 0 0 0 0  PHQ - 2 Score 0 0 0 0 0  Altered sleeping 0    0  Tired, decreased energy 0    3  Change in appetite 0    0  Feeling bad or failure about yourself  0    0  Trouble concentrating 0    0  Moving slowly or fidgety/restless 0    0  Suicidal thoughts 0    0  PHQ-9 Score 0    3    phq 9 is {gen pos [id number]}   Fall Risk:    01/04/2022    2:17 PM 08/25/2021    3:44 PM 07/02/2021    1:13 PM 07/01/2021    2:45 PM 06/19/2021    8:57 AM  Fall Risk   Falls in the past year? 0 0 0 0 0  Number falls in past yr: 0 0 0  0  Injury with Fall? 0 0  0 0  Risk for fall due to : Impaired balance/gait Impaired balance/gait Impaired balance/gait;Medication side effect Impaired mobility Impaired mobility  Follow up Falls prevention discussed Falls prevention discussed Falls prevention discussed Falls prevention discussed Falls prevention discussed      Functional Status Survey:      Assessment & Plan  *** There are no diagnoses linked to this encounter.

## 2022-07-06 ENCOUNTER — Ambulatory Visit (INDEPENDENT_AMBULATORY_CARE_PROVIDER_SITE_OTHER): Payer: Medicare Other | Admitting: Family Medicine

## 2022-07-06 ENCOUNTER — Encounter: Payer: Self-pay | Admitting: Family Medicine

## 2022-07-06 VITALS — BP 142/80 | HR 79 | Temp 98.2°F | Resp 16 | Ht 63.0 in | Wt 176.7 lb

## 2022-07-06 DIAGNOSIS — E890 Postprocedural hypothyroidism: Secondary | ICD-10-CM | POA: Diagnosis not present

## 2022-07-06 DIAGNOSIS — F325 Major depressive disorder, single episode, in full remission: Secondary | ICD-10-CM

## 2022-07-06 DIAGNOSIS — N184 Chronic kidney disease, stage 4 (severe): Secondary | ICD-10-CM | POA: Diagnosis not present

## 2022-07-06 DIAGNOSIS — E785 Hyperlipidemia, unspecified: Secondary | ICD-10-CM

## 2022-07-06 DIAGNOSIS — I10 Essential (primary) hypertension: Secondary | ICD-10-CM | POA: Diagnosis not present

## 2022-07-06 DIAGNOSIS — R7303 Prediabetes: Secondary | ICD-10-CM

## 2022-07-06 DIAGNOSIS — N2581 Secondary hyperparathyroidism of renal origin: Secondary | ICD-10-CM

## 2022-07-06 DIAGNOSIS — Z23 Encounter for immunization: Secondary | ICD-10-CM

## 2022-07-06 DIAGNOSIS — D638 Anemia in other chronic diseases classified elsewhere: Secondary | ICD-10-CM

## 2022-07-06 NOTE — Assessment & Plan Note (Signed)
On levothyroxine 75 mcg daily, last TSH was normal, per pcp to monitor annually, pt denies any concerns with med/dose or chemical hypothyroid sx Lab Results  Component Value Date   TSH 2.46 07/01/2021

## 2022-07-06 NOTE — Assessment & Plan Note (Addendum)
Per nephro and cardiology - managed on labetolol and clonidine BP Readings from Last 3 Encounters:  07/06/22 (!) 142/80  05/27/22 (!) 213/84  01/04/22 132/72  BP fairly well controlled today She asks for med refills but per chart and pharmacy dispense hx these are filled by Dr. Clayborn Bigness and Dr. Holley Raring

## 2022-07-06 NOTE — Assessment & Plan Note (Signed)
Lipids poorly controlled - last labs reviewed zetia had been added but pt does not recall discussing increasing lipitor Could try crestor Lab Results  Component Value Date   CHOL 217 (H) 07/01/2021   HDL 75 07/01/2021   LDLCALC 124 (H) 07/01/2021   TRIG 83 07/01/2021   CHOLHDL 2.9 07/01/2021  due for recheck labs - I explained that her goal is to have LDL <70, if labs are again elevated I would send her the same recommendation to try increasing dose or trying a different statin.

## 2022-07-06 NOTE — Assessment & Plan Note (Signed)
Labs reviewed with the pt, she denies any symptoms, she denies any signs or sx of bleeding Hgb 8.8 as of 10/3, previously refused hematology or meds to help with anemia

## 2022-07-06 NOTE — Assessment & Plan Note (Signed)
Per nephro, last PTH 220, not on meds/pt refused

## 2022-07-06 NOTE — Assessment & Plan Note (Signed)
Pt PHQ 9 reviewed today and negative She is not currently on meds Somewhat grumpy demeanor, she expresses frustration with having to come to the PCP office every 6 months and she does not want blood drawn

## 2022-07-06 NOTE — Assessment & Plan Note (Signed)
Not on meds, labs have been stable over the past couple years, per PCP note to be checked annually Recent pertinent labs: Lab Results  Component Value Date   HGBA1C 5.7 (H) 06/07/2021   HGBA1C 5.8 (H) 06/18/2020   HGBA1C 5.8 (H) 10/27/2019

## 2022-07-06 NOTE — Progress Notes (Signed)
Name: Betty Nielsen   MRN: [id number]    DOB: 1946/09/05   Date:07/06/2022       Progress Note  Chief Complaint  Patient presents with   Follow-up   Depression   Hypothyroidism   Hyperlipidemia   Subjective:   Betty Nielsen is a 76 y.o. female, presents to clinic for routine f/up On PCP's schedule for 6 m f/up, but got changed to mine, she needs meds refilled and is due for labs for thyroid, lipids and A1C  CKI III/ Hyponatremia:  Last labs at nephro 10/3 GFR 14 H/H 8.8/26.9 PTH 220 Reviewed last OV with nephro and labs she is under the care of  [facility], she was hospitalized end of 05/2021 and developed acute on chronic kidney disease,GFR was as low as 7 but in the teens range for a while now. . She had a renal artery stenosis, status post stent placement and Dr. Holley Raring thinks drop in kidney function likely multifactorial , HCT still down to 25.5 and was referred to hematologist  She saw Dr. Tasia Catchings back in Dec 2022 , diagnosed with iron deficiency and also anemia of chronic disease, she was advised to take ferrous sulfate ( patient stopped due to hot flashes) and offered erythropoietin therapy but she refused     HTN/CAD/Angina/Stroke:  Pt Bp mildly elevated but looks similar to her baseline with current meds, no exertional sx  bp today is in good control, taking bp medications as prescribed,  palpitation or dizziness, no recent episodes of chest pain .  She has not used any NTG since Feb 2018 ( when she had MI). She had a Stroke 10/2019 , she is back on  plavix and is taking statin therapy plus zetia   Atherosclerosis abdominal aorta/hyperlipidemia:  last ldl high today she states no one ever told her she should change her statin dose or med, just that in the hospital they added on zetia she has been taking Atorvastatin and plavix, no longer having problems with statin therapy,  history of aneurysmal of aorta repair done by Dr. Doren Custard ( vascular surgeon in [place]), also  had stent placed on right renal artery 05/2021, she recently went to see vascular surgeon but refused repeat CT angiogram but will go back for a 6 month follow up and re-consider.  Lab Results  Component Value Date   CHOL 217 (H) 07/01/2021   HDL 75 07/01/2021   LDLCALC 124 (H) 07/01/2021   TRIG 83 07/01/2021   CHOLHDL 2.9 07/01/2021  Due for labs   Hyperlipidemia:  see above - taking statin and zetia, no SE or concerns  since 10/2019 she has been taking Atorvastatin and denies side effects of medication last LDL was 124 . Explained goal is below 4 but she is not interested in switching medications    Hypothyroidism: she is s/p thyroidectomy Compliant with medication, denies dry skin, states constipation under control with dietary modification and increase in fiber diet. . Last TSH was  2.46 , she is taking Synthroid 75 mcg daily . Recheck level yearly  Lab Results  Component Value Date   TSH 2.46 07/01/2021      Depression:  PHQ reviewed and neg - not discussed with her today she is off SSRI because of hyponatremia, but states she is feeling well, in remission. 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 She denies sadness, crying spells, change in appetite, or suicidal thoughts. Phq 9 today is zero, she has been in  remission Unchanged     07/06/2022    2:02 PM 01/04/2022    2:13 PM 08/25/2021    3:41 PM  Depression screen PHQ 2/9  Decreased Interest 0 0 0  Down, Depressed, Hopeless 0 0 0  PHQ - 2 Score 0 0 0  Altered sleeping 0 0   Tired, decreased energy 0 0   Change in appetite 0 0   Feeling bad or failure about yourself  0 0   Trouble concentrating 0 0   Moving slowly or fidgety/restless 0 0   Suicidal thoughts 0 0   PHQ-9 Score 0 0   Difficult doing work/chores Not difficult at all        Metabolic syndrome:  stable prediabetes, labs checked once a year Last A1C was 5.7 %. She denies polyphagia, polydipsia or polyuria , we will recheck it yearly  Lab Results   Component Value Date   HGBA1C 5.7 (H) 06/07/2021    DAPT - she has some bruises on arms, but denies any bleeding   Current Outpatient Medications:    acetaminophen (TYLENOL) 650 MG CR tablet, Take 650 mg by mouth every 8 (eight) hours as needed for pain., Disp: , Rfl:    aspirin EC 81 MG tablet, Take 81 mg by mouth daily. Swallow whole., Disp: , Rfl:    atorvastatin (LIPITOR) 40 MG tablet, TAKE 1 TABLET BY MOUTH ONCE DAILY AT  6PM.  CHOLESTEROL  MEDICATION., Disp: 90 tablet, Rfl: 1   cetirizine (ZYRTEC) 10 MG tablet, Take 1 tablet (10 mg total) by mouth daily as needed. PRN only, Disp: , Rfl:    cloNIDine (CATAPRES) 0.2 MG tablet, Take 0.2 mg by mouth 2 (two) times daily., Disp: , Rfl:    clopidogrel (PLAVIX) 75 MG tablet, TAKE 1 TABLET BY MOUTH ONCE DAILY. BLOOD THINNER TO HELP REDUCE STROKE RISK, Disp: 90 tablet, Rfl: 1   ezetimibe (ZETIA) 10 MG tablet, Take 1 tablet by mouth once daily, Disp: 90 tablet, Rfl: 1   levothyroxine (SYNTHROID) 75 MCG tablet, TAKE 1 TABLET BY MOUTH ONCE DAILY BEFORE BREAKFAST, Disp: 90 tablet, Rfl: 1   Cholecalciferol 125 MCG (5000 UT) TABS, Take 5,000 Units by mouth daily. (Patient not taking: Reported on 07/06/2022), Disp: , Rfl:    labetalol (NORMODYNE) 200 MG tablet, Take 1 tablet (200 mg total) by mouth 2 (two) times daily., Disp: 60 tablet, Rfl: 0  Patient Active Problem List   Diagnosis Date Noted   Prediabetes 07/06/2022   Renal artery stenosis (HCC) 07/14/2021   Acute kidney injury superimposed on CKD (HCC)    Hyperkalemia    Hypothyroidism    Accelerated hypertension 06/09/2021   Left upper extremity numbness 06/07/2021   Left facial numbness 06/07/2021   Elevated serum creatinine 06/07/2021   CVA (cerebral vascular accident) ([place]) 06/06/2021   Benign hypertension with chronic kidney disease, stage III ([place]) 06/18/2020   Anemia of chronic disease 11/21/2019   Stage 3b chronic kidney disease ([place]) 11/21/2019   Hypo-osmolality and hyponatremia  11/21/2019   Secondary hyperparathyroidism of renal origin ([place]) 11/21/2019   History of stroke 10/27/2019   PMR (polymyalgia rheumatica) ([place]) 06/15/2017   Headache 10/27/2016   CKD (chronic kidney disease), stage III ([place]) 10/27/2016   Hyperlipidemia 10/27/2016   Hyponatremia 10/27/2016   Angina pectoris ([place]) 10/25/2016   History of non-ST elevation myocardial infarction (NSTEMI) 10/25/2016   History of coronary angioplasty 10/25/2016   BPV (benign positional vertigo) 06/15/2016   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[place]) 11/21/2015  Acid reflux 06/05/2015   Benign secondary hypertension due to renal artery stenosis ([place]) 06/05/2015   Hypothyroidism, 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   Major depression in remission ([place]) 06/05/2015   Obesity (BMI 30.0-34.9) 06/05/2015   Neuropathy 06/05/2015   Perennial allergic rhinitis with seasonal variation 06/05/2015   Arthritis, degenerative 07/03/2014   AAA (abdominal aortic aneurysm) without rupture ([place]) 06/26/2014    Past Surgical History:  Procedure Laterality Date   ABDOMINAL AORTIC ANEURYSM REPAIR     ABDOMINAL HYSTERECTOMY     APPENDECTOMY     CORONARY STENT INTERVENTION N/A 10/26/2016   Procedure: Coronary Stent Intervention;  Surgeon: Yolonda Kida, MD;  Location: [facility];  Service: Cardiovascular;  Laterality: N/A;   HERNIA REPAIR     INNER EAR SURGERY     pt not sure of type   LEFT HEART CATH AND CORONARY ANGIOGRAPHY N/A 10/26/2016   Procedure: Left Heart Cath and Coronary Angiography;  Surgeon: Teodoro Spray, MD;  Location: [facility];  Service: Cardiovascular;  Laterality: N/A;   RENAL ANGIOGRAPHY N/A 06/11/2021   Procedure: RENAL ANGIOGRAPHY;  Surgeon: Algernon Huxley, MD;  Location: [facility];  Service: Cardiovascular;  Laterality: N/A;   THYROIDECTOMY      Family History  Problem Relation Age of Onset   Healthy Mother    Cerebral aneurysm Father     Social History   Tobacco Use   Smoking  status: Former    Packs/day: 0.50    Years: 20.00    Total pack years: 10.00    Types: Cigarettes    Quit date: 2000    Years since quitting: 23.8   Smokeless tobacco: Never   Tobacco comments:    smoking cessation materials not required  Vaping Use   Vaping Use: Never used  Substance Use Topics   Alcohol use: No    Alcohol/week: 0.0 standard drinks of alcohol   Drug use: No     Allergies  Allergen Reactions   Ace Inhibitors Cough   Ciprofloxacin Other (See Comments)    unknown   Citalopram     hyponatremia   Hydrocodone-Acetaminophen Other (See Comments)    unknown   Norvasc [Amlodipine Besylate]     Pruritus    Pantoprazole Sodium Other (See Comments)    Cramps   Pravastatin Other (See Comments)    Stomach cramps   Sulfa Antibiotics     unknown   Clonidine Other (See Comments)    Per patient told by allergist that would be best to stop but nephrologist recommended her to stay on medication     Health Maintenance  Topic Date Due   COVID-19 Vaccine (4 - [place] series) 07/22/2022 (Originally 11/10/2020)   DEXA SCAN  08/25/2022 (Originally 05/29/2011)   INFLUENZA VACCINE  12/19/2022 (Originally 04/20/2022)   Pneumonia Vaccine 79+ Years old  Completed   Hepatitis C Screening  Completed   Zoster Vaccines- Shingrix  Completed   HPV VACCINES  Aged Out   TETANUS/TDAP  Discontinued    Chart Review Today: I personally reviewed active problem list, medication list, allergies, family history, social history, health maintenance, notes from last encounter, lab results, imaging with the patient/caregiver today.   Review of Systems  Constitutional: Negative.   HENT: Negative.    Eyes: Negative.   Respiratory: Negative.    Cardiovascular: Negative.   Gastrointestinal: Negative.   Endocrine: Negative.   Genitourinary: Negative.   Musculoskeletal: Negative.   Skin: Negative.   Allergic/Immunologic: Negative.  Neurological: Negative.   Hematological: Negative.    Psychiatric/Behavioral: Negative.    All other systems reviewed and are negative.    Objective:   Vitals:   07/06/22 1402 07/06/22 1420  BP: (!) 160/82 (!) 142/80  Pulse: 79   Resp: 16   Temp: 98.2 F (36.8 C)   TempSrc: Oral   SpO2: 100%   Weight: 176 lb 11.2 oz (80.2 kg)   Height: 5\' 3"  (1.6 m)     Body mass index is 31.3 kg/m.  Physical Exam Vitals and nursing note reviewed.  Constitutional:      General: She is not in acute distress.    Appearance: Normal appearance. She is obese. She is not ill-appearing or toxic-appearing.  HENT:     Head: Normocephalic and atraumatic.     Right Ear: External ear normal.     Left Ear: External ear normal.  Eyes:     General: No scleral icterus.       Right eye: No discharge.        Left eye: No discharge.     Conjunctiva/sclera: Conjunctivae normal.  Cardiovascular:     Rate and Rhythm: Normal rate and regular rhythm.     Pulses: Normal pulses.     Heart sounds: Normal heart sounds. No murmur heard.    No friction rub. No gallop.  Pulmonary:     Effort: Pulmonary effort is normal. No respiratory distress.     Breath sounds: Normal breath sounds. No stridor. No wheezing, rhonchi or rales.  Abdominal:     General: Bowel sounds are normal.     Palpations: Abdomen is soft.  Skin:    General: Skin is warm.     Coloration: Skin is not jaundiced or pale.     Findings: No bruising or rash.  Neurological:     Mental Status: She is alert.         Assessment & Plan:   Problem List Items Addressed This Visit       Cardiovascular and Mediastinum   Hypertension    Per nephro and cardiology - managed on labetolol and clonidine BP Readings from Last 3 Encounters:  07/06/22 (!) 142/80  05/27/22 (!) 213/84  01/04/22 132/72  BP fairly well controlled today She asks for med refills but per chart and pharmacy dispense hx these are filled by Dr. Clayborn Bigness and Dr. Holley Raring         Endocrine   Hypothyroidism, postsurgical     On levothyroxine 75 mcg daily, last TSH was normal, per pcp to monitor annually, pt denies any concerns with med/dose or chemical hypothyroid sx Lab Results  Component Value Date   TSH 2.46 07/01/2021        Relevant Orders   TSH   Secondary hyperparathyroidism of renal origin ([place])    Per nephro, last PTH 220, not on meds/pt refused        Genitourinary   Chronic kidney disease, stage IV (severe) ([place]) - Primary    Per nephro - she is seeing them every 1-2 months, last GFR 14 Last OV note and labs reviewed        Other   Major depression in remission ([place])    Pt PHQ 9 reviewed today and negative She is not currently on meds Somewhat grumpy demeanor, she expresses frustration with having to come to the PCP office every 6 months and she does not want blood drawn       Hyperlipidemia    Lipids  poorly controlled - last labs reviewed zetia had been added but pt does not recall discussing increasing lipitor Could try crestor Lab Results  Component Value Date   CHOL 217 (H) 07/01/2021   HDL 75 07/01/2021   LDLCALC 124 (H) 07/01/2021   TRIG 83 07/01/2021   CHOLHDL 2.9 07/01/2021  due for recheck labs - I explained that her goal is to have LDL <70, if labs are again elevated I would send her the same recommendation to try increasing dose or trying a different statin.        Relevant Orders   Lipid panel   Anemia of chronic disease    Labs reviewed with the pt, she denies any symptoms, she denies any signs or sx of bleeding Hgb 8.8 as of 10/3, previously refused hematology or meds to help with anemia      Prediabetes    Not on meds, labs have been stable over the past couple years, per PCP note to be checked annually Recent pertinent labs: Lab Results  Component Value Date   HGBA1C 5.7 (H) 06/07/2021   HGBA1C 5.8 (H) 06/18/2020   HGBA1C 5.8 (H) 10/27/2019        Relevant Orders   Hemoglobin A1c   Other Visit Diagnoses     Need for influenza vaccination            Pt meds to be refilled after labs are checked - I confirmed she will not run out of meds for another 1-2 weeks  Return for 6 month f/up with PCP - [facility] .   Delsa Grana, PA-C 07/06/22 2:59 PM

## 2022-07-06 NOTE — Assessment & Plan Note (Signed)
Per nephro - she is seeing them every 1-2 months, last GFR 14 Last OV note and labs reviewed

## 2022-07-07 LAB — LIPID PANEL
Cholesterol: 176 mg/dL (ref <200)
HDL: 57 mg/dL (ref >=50)
LDL Cholesterol (Calc): 99 mg/dL (calc)
Non-HDL Cholesterol (Calc): 119 mg/dL (calc) (ref <130)
Total CHOL/HDL Ratio: 3.1 (calc) (ref <5.0)
Triglycerides: 105 mg/dL (ref <150)

## 2022-07-07 LAB — HEMOGLOBIN A1C
Hgb A1c MFr Bld: 5.5 % of total Hgb (ref <5.7)
Mean Plasma Glucose: 111 mg/dL
eAG (mmol/L): 6.2 mmol/L

## 2022-07-07 LAB — TSH: TSH: 1.67 mIU/L (ref 0.40–4.50)

## 2022-07-08 ENCOUNTER — Other Ambulatory Visit: Payer: Self-pay | Admitting: Family Medicine

## 2022-07-08 DIAGNOSIS — I209 Angina pectoris, unspecified: Secondary | ICD-10-CM

## 2022-07-08 DIAGNOSIS — E890 Postprocedural hypothyroidism: Secondary | ICD-10-CM

## 2022-07-08 DIAGNOSIS — I700 Atherosclerosis of aorta: Secondary | ICD-10-CM

## 2022-07-08 DIAGNOSIS — Z8673 Personal history of transient ischemic attack (TIA), and cerebral infarction without residual deficits: Secondary | ICD-10-CM

## 2022-07-08 MED ORDER — ATORVASTATIN CALCIUM 40 MG PO TABS: ORAL_TABLET | ORAL | 1 refills | Status: DC

## 2022-07-08 MED ORDER — LEVOTHYROXINE SODIUM 75 MCG PO TABS: 75.0000 ug | ORAL_TABLET | Freq: Every day | ORAL | 1 refills | Status: DC

## 2022-07-22 ENCOUNTER — Other Ambulatory Visit: Payer: Self-pay

## 2022-07-22 ENCOUNTER — Telehealth: Payer: Self-pay | Admitting: Family Medicine

## 2022-07-22 ENCOUNTER — Other Ambulatory Visit: Payer: Self-pay | Admitting: Family Medicine

## 2022-07-22 MED ORDER — LABETALOL HCL 200 MG PO TABS: 200.0000 mg | ORAL_TABLET | Freq: Two times a day (BID) | ORAL | 0 refills | Status: DC

## 2022-07-22 MED ORDER — CLONIDINE HCL 0.2 MG PO TABS: 0.2000 mg | ORAL_TABLET | Freq: Two times a day (BID) | ORAL | 0 refills | Status: DC

## 2022-07-22 NOTE — Telephone Encounter (Signed)
Pt saw Leisa at her last visit and she had asked for a refill on clonidine, however, Leisa did not send it in. She states she took the last pill this morning and is asking that you send a refill to [facility].

## 2022-07-22 NOTE — Telephone Encounter (Signed)
Tried to call pt to get her scheduled. No answer no VM

## 2022-08-02 ENCOUNTER — Other Ambulatory Visit: Payer: Self-pay | Admitting: Family Medicine

## 2022-08-25 ENCOUNTER — Ambulatory Visit (INDEPENDENT_AMBULATORY_CARE_PROVIDER_SITE_OTHER): Payer: Medicare Other | Admitting: Physician Assistant

## 2022-08-25 ENCOUNTER — Encounter: Payer: Self-pay | Admitting: Physician Assistant

## 2022-08-25 DIAGNOSIS — Z0000 Encounter for general adult medical examination without abnormal findings: Secondary | ICD-10-CM

## 2022-08-25 NOTE — Progress Notes (Signed)
Virtual Visit via Telephone Note  I connected with  Betty Nielsen on 08/25/22 at 9:40 AM by telephone and verified that I am speaking with the correct person using two identifiers.  Location: Patient: At home, [place], [place]  Provider: [facility], [place], [place] participating in the virtual visit: [place]   I discussed the limitations, risks, security and privacy concerns of performing an evaluation and management service by telephone and the availability of in person appointments. The patient expressed understanding and agreed to proceed.  Interactive audio and video telecommunications were attempted between this nurse and patient, however failed, due to patient having technical difficulties OR patient did not have access to video capability.  We continued and completed visit with audio only.  Some vital signs may be absent or patient reported.     Subjective:   Betty Nielsen is a 76 y.o. female who presents for Medicare Annual (Subsequent) preventive examination.  Review of Systems:        Objective:     Vitals: There were no vitals taken for this visit.  There is no height or weight on file to calculate BMI.     09/09/2021    2:17 PM 08/25/2021    3:41 PM 07/06/2021   11:19 AM 06/06/2021    9:10 PM 06/10/2020   11:28 AM 10/25/2019   11:02 PM 10/11/2019   10:21 AM  Advanced Directives  Does Patient Have a Medical Advance Directive? Yes Yes Yes No No No;Yes Yes  Type of Advance Directive Living will;Healthcare Power of [place];Living will    Day   Does patient want to make changes to medical advance directive?      No - Patient declined No - Patient declined  Copy of [place] in Chart?  No - copy requested       Would patient like information on creating a medical advance directive?    No - Patient declined No - Patient declined  No - Patient declined     Tobacco Social History   Tobacco Use  Smoking Status Former   Packs/day: 0.50   Years: 20.00   Total pack years: 10.00   Types: Cigarettes   Quit date: 2000   Years since quitting: 23.9  Smokeless Tobacco Never  Tobacco Comments   smoking cessation materials not required     Counseling given: Not Answered Tobacco comments: smoking cessation materials not required   Clinical Intake:   Past Medical History:  Diagnosis Date   Arthrosis of knee    Chronic kidney disease    Hyperlipidemia    Hypertension    Loss of hearing    Metabolic syndrome    Plantar fasciitis    Renal insufficiency    Past Surgical History:  Procedure Laterality Date   ABDOMINAL AORTIC ANEURYSM REPAIR     ABDOMINAL HYSTERECTOMY     APPENDECTOMY     CORONARY STENT INTERVENTION N/A 10/26/2016   Procedure: Coronary Stent Intervention;  Surgeon: Yolonda Kida, MD;  Location: [facility];  Service: Cardiovascular;  Laterality: N/A;   HERNIA REPAIR     INNER EAR SURGERY     pt not sure of type   LEFT HEART CATH AND CORONARY ANGIOGRAPHY N/A 10/26/2016   Procedure: Left Heart Cath and Coronary Angiography;  Surgeon: Teodoro Spray, MD;  Location: [facility];  Service: Cardiovascular;  Laterality: N/A;   RENAL  ANGIOGRAPHY N/A 06/11/2021   Procedure: RENAL ANGIOGRAPHY;  Surgeon: Algernon Huxley, MD;  Location: [facility];  Service: Cardiovascular;  Laterality: N/A;   THYROIDECTOMY     Family History  Problem Relation Age of Onset   Healthy Mother    Cerebral aneurysm Father    Social History   Socioeconomic History   Marital status: Widowed    Spouse name: Not on file   Number of children: 1   Years of education: GED   Highest education level: 10th grade  Occupational History   Occupation: Retired  Tobacco Use   Smoking status: Former    Packs/day: 0.50    Years: 20.00    Total pack years: 10.00    Types: Cigarettes    Quit date: 2000    Years since  quitting: 23.9   Smokeless tobacco: Never   Tobacco comments:    smoking cessation materials not required  Vaping Use   Vaping Use: Never used  Substance and Sexual Activity   Alcohol use: No    Alcohol/week: 0.0 standard drinks of alcohol   Drug use: No   Sexual activity: Not Currently  Other Topics Concern   Not on file  Social History Narrative   Lives alone   Retired from working a [place] Strain: [place]  (08/25/2022)   Overall Financial Resource Strain (CARDIA)    Difficulty of Paying Living Expenses: Not hard at all  Food Insecurity: No Food Insecurity (08/25/2022)   Hunger Vital Sign    Worried About Running Out of Food in the Last Year: Never true    [place] in the Last Year: Never true  Transportation Needs: No Transportation Needs (08/25/2022)   PRAPARE - Hydrologist (Medical): No    Lack of Transportation (Non-Medical): No  Physical Activity: Inactive (08/25/2022)   Exercise Vital Sign    Days of Exercise per Week: 0 days    Minutes of Exercise per Session: 0 min  Stress: No Stress Concern Present (08/25/2022)   [place]    Feeling of Stress : Only a little  Social Connections: Unknown (08/25/2022)   Social Connection and Isolation Panel [NHANES]    Frequency of Communication with Friends and Family: More than three times a week    Frequency of Social Gatherings with Friends and Family: More than three times a week    Attends Religious Services: Patient refused    Active Member of Clubs or Organizations: Patient refused    Attends Archivist Meetings: Patient refused    Marital Status: Patient refused    Outpatient Encounter Medications as of 08/25/2022  Medication Sig   acetaminophen (TYLENOL) 650 MG CR tablet Take 650 mg by mouth every 8 (eight) hours as needed for pain.   aspirin EC 81 MG tablet  Take 81 mg by mouth daily. Swallow whole.   atorvastatin (LIPITOR) 40 MG tablet TAKE 1 TABLET BY MOUTH ONCE DAILY AT  6PM.  CHOLESTEROL  MEDICATION.   cetirizine (ZYRTEC) 10 MG tablet Take 1 tablet (10 mg total) by mouth daily as needed. PRN only   Cholecalciferol 125 MCG (5000 UT) TABS Take 5,000 Units by mouth daily.   cloNIDine (CATAPRES) 0.2 MG tablet Take 1 tablet (0.2 mg total) by mouth 2 (two) times daily.   clopidogrel (PLAVIX) 75 MG tablet TAKE 1 TABLET BY  MOUTH ONCE DAILY .  BLOOD  THINNER  TO  HELP  REDUCE  STROKE  RISK   ezetimibe (ZETIA) 10 MG tablet Take 1 tablet by mouth once daily   labetalol (NORMODYNE) 200 MG tablet Take 1 tablet (200 mg total) by mouth 2 (two) times daily.   levothyroxine (SYNTHROID) 75 MCG tablet Take 1 tablet (75 mcg total) by mouth daily before breakfast.   No facility-administered encounter medications on file as of 08/25/2022.    Activities of Daily Living    08/25/2022    9:03 AM 07/06/2022    2:02 PM  In your present state of health, do you have any difficulty performing the following activities:  Hearing? 0 1  Vision? 0 0  Difficulty concentrating or making decisions? 0 0  Walking or climbing stairs? 0 0  Dressing or bathing? 0 0  Doing errands, shopping? 0 0    Patient Care Team: Steele Sizer, MD as PCP - General (Family Medicine) Lavonia Dana, MD as Consulting Physician (Internal Medicine) Yolonda Kida, MD as Consulting Physician (Cardiology) Emmaline Kluver., MD as Consulting Physician (Rheumatology) Angelia Mould, MD as Consulting Physician (Vascular Surgery) Germaine Pomfret, [facility] (Pharmacist) Earlie Server, MD as Consulting Physician (Hematology and Oncology)    Assessment:   This is a routine wellness examination for Alline.  Exercise Activities and Dietary recommendations     Goals Addressed   None     Fall Risk:    08/25/2022    9:03 AM 07/06/2022    2:01 PM 01/04/2022    2:17 PM 08/25/2021     3:44 PM 07/02/2021    1:13 PM  [place] in the past year? 0 0 0 0 0  Number falls in past yr: 0 0 0 0 0  Injury with Fall? 0 0 0 0   Risk for fall due to : No Fall Risks No Fall Risks Impaired balance/gait Impaired balance/gait Impaired balance/gait;Medication side effect  Follow up Falls prevention discussed;Education provided;Falls evaluation completed Falls prevention discussed;Education provided Falls prevention discussed Falls prevention discussed Falls prevention discussed    FALL RISK PREVENTION PERTAINING TO THE HOME:  Any stairs in or around the home? Yes If so, are there any without handrails? Yes   Home free of loose throw rugs in walkways, pet beds, electrical cords, etc? Yes  Adequate lighting in your home to reduce risk of falls? No   ASSISTIVE DEVICES UTILIZED TO PREVENT FALLS:  Life alert? No  Use of a cane, walker or w/c? Yes  Grab bars in the bathroom? Yes  Shower chair or bench in shower? No  Elevated toilet seat or a handicapped toilet? No   DME ORDERS:  DME order needed?  No   TIMED UP AND GO:  Was the test performed? No .  Length of time to ambulate 10 feet: 0 sec.   GAIT:   Education: Fall risk prevention has been discussed.  Intervention(s) required? No   DME/home health order needed?  No    Depression Screen    08/25/2022    9:04 AM 07/06/2022    2:02 PM 01/04/2022    2:13 PM 08/25/2021    3:41 PM  PHQ 2/9 Scores  PHQ - 2 Score 0 0 0 0  PHQ- 9 Score 0 0 0      Cognitive Function        06/07/2019    2:53 PM 03/30/2018    2:06 PM  6CIT  Screen  What Year? 0 points 0 points  What month? 0 points 0 points  What time? 0 points 0 points  Count back from 20 0 points 0 points  Months in reverse 0 points 0 points  Repeat phrase 0 points 2 points  Total Score 0 points 2 points    Immunization History  Administered Date(s) Administered   Influenza, High Dose Seasonal PF 06/05/2015, 05/12/2017, 06/03/2019, 07/20/2019    Influenza-Unspecified 05/12/2017, 06/02/2020, 05/28/2021   PFIZER(Purple Top)SARS-COV-2 Vaccination 11/12/2019, 11/30/2019, 09/15/2020   Pneumococcal Conjugate-13 07/17/2014   Pneumococcal Polysaccharide-23 11/24/2015   Zoster Recombinat (Shingrix) 03/22/2022, 06/16/2022    Qualifies for Shingles Vaccine?  Zostavax completed 06/16/2022. Education has been provided regarding the importance of this vaccine. Pt has been advised to call insurance company to determine out of pocket expense. Advised may also receive vaccine at local pharmacy or Health Dept. Verbalized acceptance and understanding.  Tdap: Although this vaccine is not a covered service during a Wellness Exam, does the patient still wish to receive this vaccine today?  No .  Education has been provided regarding the importance of this vaccine. Advised may receive this vaccine at local pharmacy or Health Dept. Aware to provide a copy of the vaccination record if obtained from local pharmacy or Health Dept. Verbalized acceptance and understanding.  Flu Vaccine: Due for Flu vaccine. Does the patient want to receive this vaccine today?  No . Education has been provided regarding the importance of this vaccine but still declined. Advised may receive this vaccine at local pharmacy or Health Dept. Aware to provide a copy of the vaccination record if obtained from local pharmacy or Health Dept. Verbalized acceptance and understanding.  Pneumococcal Vaccine:    Completed 11/24/2015 . Education has been provided regarding the importance of this vaccine but still declined. Advised may receive this vaccine at local pharmacy or Health Dept. Aware to provide a copy of the vaccination record if obtained from local pharmacy or Health Dept. Verbalized acceptance and understanding.   Covid-19 Vaccine:  Completed vaccines  Screening Tests Health Maintenance  Topic Date Due   COVID-19 Vaccine (4 - 2023-24 season) 05/21/2022   Medicare Annual Wellness (AWV)   08/25/2022   DEXA SCAN  08/25/2022 (Originally 05/29/2011)   INFLUENZA VACCINE  12/19/2022 (Originally 04/20/2022)   Pneumonia Vaccine 62+ Years old  Completed   Hepatitis C Screening  Completed   Zoster Vaccines- Shingrix  Completed   HPV VACCINES  Aged Out   DTaP/Tdap/Td  Discontinued    Cancer Screenings:  Colorectal Screening: No longer required.  Mammogram:  Declined.   Bone Density: Declined.  Lung Cancer Screening: (Low Dose CT Chest recommended if Age 85-80 years, 30 pack-year currently smoking OR have quit w/in 15years.) does not qualify.   Lung Cancer Screening Referral: An Epic message has been sent to Burgess Estelle, RN (Oncology Nurse Navigator) regarding the possible need for this exam. Raquel Sarna will review the patient's chart to determine if the patient truly qualifies for the exam. If the patient qualifies, Raquel Sarna will order the Low Dose CT of the chest to facilitate the scheduling of this exam.  Additional Screening:  Hepatitis C Screening: does qualify; Completed 09/03/22  Vision Screening: Recommended annual ophthalmology exams for early detection of glaucoma and other disorders of the eye. Is the patient up to date with their annual eye exam?  Yes  Who is the provider or what is the name of the office in which the pt attends annual eye exams? [facility]  Dental Screening: Recommended annual dental exams for proper oral hygiene  Community Resource Referral:  CRR required this visit?  No       Plan:  I have personally reviewed and addressed the Medicare Annual Wellness questionnaire and have noted the following in the patient's chart:  A. Medical and social history B. Use of alcohol, tobacco or illicit drugs  C. Current medications and supplements D. Functional ability and status E.  Nutritional status F.  Physical activity G. Advance directives H. List of other physicians I.  Hospitalizations, surgeries, and ER visits in previous 12 months J.   [place] such as hearing and vision if needed, cognitive and depression L. Referrals and appointments   In addition, I have reviewed and discussed with patient certain preventive protocols, quality metrics, and best practice recommendations. A written personalized care plan for preventive services as well as general preventive health recommendations were provided to patient.  Signed,   Talitha Givens, MHS, PA-C [facility]

## 2022-08-26 ENCOUNTER — Ambulatory Visit: Payer: Medicare Other

## 2022-09-16 ENCOUNTER — Other Ambulatory Visit: Payer: Self-pay | Admitting: Family Medicine

## 2022-09-16 DIAGNOSIS — Z9889 Other specified postprocedural states: Secondary | ICD-10-CM

## 2022-09-17 NOTE — Telephone Encounter (Signed)
Pt called to check status of refill for ezetimibe (ZETIA) 10 MG tablet [[id number]]   pharmacy advised they have requested twice from office / please advise

## 2022-10-05 ENCOUNTER — Other Ambulatory Visit: Payer: Self-pay | Admitting: Family Medicine

## 2022-11-26 ENCOUNTER — Ambulatory Visit (INDEPENDENT_AMBULATORY_CARE_PROVIDER_SITE_OTHER): Payer: Medicare Other | Admitting: Vascular Surgery

## 2022-11-26 ENCOUNTER — Encounter (INDEPENDENT_AMBULATORY_CARE_PROVIDER_SITE_OTHER): Payer: Medicare Other

## 2022-12-01 ENCOUNTER — Other Ambulatory Visit: Payer: Self-pay | Admitting: Family Medicine

## 2022-12-01 DIAGNOSIS — Z8673 Personal history of transient ischemic attack (TIA), and cerebral infarction without residual deficits: Secondary | ICD-10-CM

## 2022-12-01 DIAGNOSIS — I700 Atherosclerosis of aorta: Secondary | ICD-10-CM

## 2022-12-01 DIAGNOSIS — I209 Angina pectoris, unspecified: Secondary | ICD-10-CM

## 2022-12-01 NOTE — Telephone Encounter (Signed)
Requested Prescriptions  Pending Prescriptions Disp Refills   atorvastatin (LIPITOR) 40 MG tablet [Pharmacy Med Name: Atorvastatin Calcium 40 MG Oral Tablet] 90 tablet 0    Sig: TAKE 1 TABLET BY MOUTH ONCE DAILY AT  6PM.  CHOLESTEROL  MEDICATION.     Cardiovascular:  Antilipid - Statins Failed - 12/01/2022  3:19 PM      Failed - Lipid Panel in normal range within the last 12 months    Cholesterol  Date Value Ref Range Status  07/06/2022 176 <200 mg/dL Final   LDL Cholesterol (Calc)  Date Value Ref Range Status  07/06/2022 99 mg/dL (calc) Final    Comment:    Reference range: <100 . Desirable range <100 mg/dL for primary prevention;   <70 mg/dL for patients with CHD or diabetic patients  with > or = 2 CHD risk factors. Marland Kitchen LDL-C is now calculated using the Martin-Hopkins  calculation, which is a validated novel method providing  better accuracy than the Friedewald equation in the  estimation of LDL-C.  Cresenciano Genre et al. Annamaria Helling. [id number]): 2061-2068  (http://education.[facility].com/faq/FAQ164)    HDL  Date Value Ref Range Status  07/06/2022 57 > OR = 50 mg/dL Final   Triglycerides  Date Value Ref Range Status  07/06/2022 105 <150 mg/dL Final         Passed - Patient is not pregnant      Passed - Valid encounter within last 12 months    Recent Outpatient Visits           3 months ago Encounter for [employer] annual wellness exam   [facility] Mecum, Erin E, PA-C   4 months ago Chronic kidney disease, stage IV (severe) [facility])   [facility] Delsa Grana, PA-C   11 months ago Atherosclerosis of abdominal aorta [facility])   [facility] Steele Sizer, MD   1 year ago Stage 3b chronic kidney disease [facility])   [facility] Steele Sizer, MD   1 year ago Hospital discharge follow-up   [facility] Steele Sizer, MD       Future  Appointments             In 1 month Ancil Boozer, Drue Stager, MD [facility] [place]   In 9 months  [place]

## 2022-12-03 ENCOUNTER — Other Ambulatory Visit: Payer: Self-pay | Admitting: Family Medicine

## 2022-12-03 DIAGNOSIS — Z9889 Other specified postprocedural states: Secondary | ICD-10-CM

## 2023-01-04 NOTE — Progress Notes (Unsigned)
Name: Betty Nielsen   MRN: [id number]    DOB: 1946-03-01   Date:01/05/2023       Progress Note  Subjective  Chief Complaint  Follow Up  HPI  CKI stage V: she is under the care of  [facility], she was hospitalized end of 05/2021 and developed acute on chronic kidney disease,GFR was as low as 7 and has been stable in the teens range. . She had a renal artery stenosis, status post stent placement and Dr. Cherylann Ratel thinks drop in kidney function likely multifactorial , HCT is low, she also has secondary hyperparathyroidism and proteinuria. She has good urine output, no pruritus   Iron deficiency anemia: also has anemia of chronic disease ,  last visit with Dr. Cathie Hoops was Dec 2022, she had 3 iron infusions in 2022. She is currently taking ferrous sulfate 325 mg once daily. She states it causes her to have hot flashes , cannot tolerate more than one because it causes constipation    HTN/CAD/Angina/Stroke: bp today is in good control, taking bp medications as prescribed,  palpitation or dizziness  She has not used any NTG since Feb 2018 ( when she had MI). She had a Stroke 10/2019 , she is back on  plavix and is taking statin therapy and Zetia She is no longer going to see Dr. Juliann Pares, I asked why and she said " I am fine"    Atherosclerosis abdominal aorta/hyperlipidemia: she has been taking Atorvastatin, plavix and aspirin , no longer having problems with statin therapy,  history of aneurysmal of aorta repair done by Dr. Durwin Nora ( vascular surgeon in [place]), also had stent placed on right renal artery 05/2021, last visit at vascular and vein was September, she refused to have CT angiogram done, she ordered renal US but it was not done either.    Hyperlipidemia: since 10/2019 she has been taking Atorvastatin  and Zetia, last LDL was slightly better down to 99 but still not at goal. She does not want to change any mediations at this time.   Hypothyroidism: she is s/p thyroidectomy Compliant with  medication, denies dry skin, states constipation under control with dietary modification and increase in fiber diet. Marland Kitchen TSH has been at goal. Continue current dose    Major Depression in remission : she is off SSRI because of hyponatremia, but states she is feeling well, in remission. 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 She denies sadness, crying spells, change in appetite, or suicidal thoughts. Phq 9 today is zero, she has been in remission   Metabolic syndrome: Last A1C improved from  5.7 % down to 5.5 % . She denies polyphagia, polydipsia or polyuria , we will recheck it yearly   AR: she has year round symptoms but worse in the Spring, currently watery eyes, sneezing, also has rhinorrhea. She sees ENT/ Dr. Andee Poles. She states could not take allergy pill he prescribed because it made her feel anxious. , she states nasal spray causes her to have vertigo  Weight loss: 10 lbs in the past 6 months, she states normal appetite, discussed adding snacks to try to gain some weight.   Patient Active Problem List   Diagnosis Date Noted   Prediabetes 07/06/2022   Hypertension 07/06/2022   Renal artery stenosis 07/14/2021   Hyperkalemia    Accelerated hypertension 06/09/2021   Left upper extremity numbness 06/07/2021   Left facial numbness 06/07/2021   Elevated serum creatinine 06/07/2021   Anemia of chronic disease 11/21/2019  Hypo-osmolality and hyponatremia 11/21/2019   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/21/2019   History of stroke 10/27/2019   Headache 10/27/2016   Hyperlipidemia 10/27/2016   Hyponatremia 10/27/2016   History of non-ST elevation myocardial infarction (NSTEMI) 10/25/2016   History of coronary angioplasty 10/25/2016   BPV (benign positional vertigo) 06/15/2016   Atherosclerosis of abdominal aorta 11/21/2015   Acid reflux 06/05/2015   Benign secondary hypertension due to renal artery stenosis (HCC) 06/05/2015   Hypothyroidism, 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    Major depression in remission 06/05/2015   Obesity (BMI 30.0-34.9) 06/05/2015   Neuropathy 06/05/2015   Perennial allergic rhinitis with seasonal variation 06/05/2015   Arthritis, degenerative 07/03/2014   AAA (abdominal aortic aneurysm) without rupture 06/26/2014    Past Surgical History:  Procedure Laterality Date   ABDOMINAL AORTIC ANEURYSM REPAIR     ABDOMINAL HYSTERECTOMY     APPENDECTOMY     CORONARY STENT INTERVENTION N/A 10/26/2016   Procedure: Coronary Stent Intervention;  Surgeon: Alwyn Pea, MD;  Location: [facility];  Service: Cardiovascular;  Laterality: N/A;   HERNIA REPAIR     INNER EAR SURGERY     pt not sure of type   LEFT HEART CATH AND CORONARY ANGIOGRAPHY N/A 10/26/2016   Procedure: Left Heart Cath and Coronary Angiography;  Surgeon: Dalia Heading, MD;  Location: [facility];  Service: Cardiovascular;  Laterality: N/A;   RENAL ANGIOGRAPHY N/A 06/11/2021   Procedure: RENAL ANGIOGRAPHY;  Surgeon: Annice Needy, MD;  Location: [facility];  Service: Cardiovascular;  Laterality: N/A;   THYROIDECTOMY      Family History  Problem Relation Age of Onset   Healthy Mother    Cerebral aneurysm Father     Social History   Tobacco Use   Smoking status: Former    Packs/day: 0.50    Years: 20.00    Additional pack years: 0.00    Total pack years: 10.00    Types: Cigarettes    Quit date: 2000    Years since quitting: 24.3   Smokeless tobacco: Never   Tobacco comments:    smoking cessation materials not required  Substance Use Topics   Alcohol use: No    Alcohol/week: 0.0 standard drinks of alcohol     Current Outpatient Medications:    acetaminophen (TYLENOL) 650 MG CR tablet, Take 650 mg by mouth every 8 (eight) hours as needed for pain., Disp: , Rfl:    aspirin EC 81 MG tablet, Take 81 mg by mouth daily. Swallow whole., Disp: , Rfl:    atorvastatin (LIPITOR) 40 MG tablet, TAKE 1 TABLET BY MOUTH ONCE DAILY AT  6PM.  CHOLESTEROL   MEDICATION., Disp: 90 tablet, Rfl: 1   cetirizine (ZYRTEC) 10 MG tablet, Take 1 tablet (10 mg total) by mouth daily as needed. PRN only, Disp: , Rfl:    Cholecalciferol 125 MCG (5000 UT) TABS, Take 5,000 Units by mouth daily., Disp: , Rfl:    labetalol (NORMODYNE) 200 MG tablet, Take 1 tablet (200 mg total) by mouth 2 (two) times daily., Disp: 180 tablet, Rfl: 0   cloNIDine (CATAPRES) 0.2 MG tablet, Take 1 tablet (0.2 mg total) by mouth 2 (two) times daily., Disp: 180 tablet, Rfl: 1   clopidogrel (PLAVIX) 75 MG tablet, TAKE 1 TABLET BY MOUTH ONCE DAILY .  BLOOD  THINNER  TO  HELP  REDUCE  STROKE  RISK, Disp: 90 tablet, Rfl: 1   ezetimibe (ZETIA) 10 MG tablet, Take 1  tablet (10 mg total) by mouth daily., Disp: 90 tablet, Rfl: 1   levothyroxine (SYNTHROID) 75 MCG tablet, Take 1 tablet (75 mcg total) by mouth daily before breakfast., Disp: 90 tablet, Rfl: 1  Allergies  Allergen Reactions   Ace Inhibitors Cough   Ciprofloxacin Other (See Comments)    unknown   Citalopram     hyponatremia   Hydrocodone-Acetaminophen Other (See Comments)    unknown   Norvasc [Amlodipine Besylate]     Pruritus    Pantoprazole Sodium Other (See Comments)    Cramps   Pravastatin Other (See Comments)    Stomach cramps   Sulfa Antibiotics     unknown   Clonidine Other (See Comments)    Per patient told by allergist that would be best to stop but nephrologist recommended her to stay on medication     I personally reviewed active problem list, medication list, allergies, family history, social history, health maintenance with the patient/caregiver today.   ROS  Ten systems reviewed and is negative except as mentioned in HPI   Objective  Vitals:   01/05/23 1316  BP: 132/70  Pulse: 75  Resp: 16  SpO2: 100%  Weight: 167 lb (75.8 kg)  Height:  (1.626 m)    Body mass index is 28.67 kg/m.  Physical Exam  Constitutional: Patient appears well-developed and well-nourished.  No distress.  HEENT:  head atraumatic, normocephalic, pupils equal and reactive to light,neck supple, Cardiovascular: Normal rate, regular rhythm and normal heart sounds.  No murmur heard. No BLE edema. Pulmonary/Chest: Effort normal and breath sounds normal. No respiratory distress. Abdominal: Soft.  There is no tenderness. Psychiatric: Patient has a normal mood and affect. behavior is normal. Judgment and thought content normal.   PHQ2/9:    01/05/2023    1:15 PM 08/25/2022    9:04 AM 07/06/2022    2:02 PM 01/04/2022    2:13 PM 08/25/2021    3:41 PM  Depression screen PHQ 2/9  Decreased Interest 0 0 0 0 0  Down, Depressed, Hopeless 0 0 0 0 0  PHQ - 2 Score 0 0 0 0 0  Altered sleeping 0 0 0 0   Tired, decreased energy 0 0 0 0   Change in appetite 0 0 0 0   Feeling bad or failure about yourself  0 0 0 0   Trouble concentrating 0 0 0 0   Moving slowly or fidgety/restless 0 0 0 0   Suicidal thoughts 0 0 0 0   PHQ-9 Score 0 0 0 0   Difficult doing work/chores  Not difficult at all Not difficult at all      phq 9 is negative   Fall Risk:    01/05/2023    1:15 PM 08/25/2022    9:03 AM 07/06/2022    2:01 PM 01/04/2022    2:17 PM 08/25/2021    3:44 PM  Fall Risk   Falls in the past year? 1 0 0 0 0  Number falls in past yr: 0 0 0 0 0  Injury with Fall? 1 0 0 0 0  Risk for fall due to : Impaired balance/gait No Fall Risks No Fall Risks Impaired balance/gait Impaired balance/gait  Follow up Falls prevention discussed Falls prevention discussed;Education provided;Falls evaluation completed Falls prevention discussed;Education provided Falls prevention discussed Falls prevention discussed      Functional Status Survey: Is the patient deaf or have difficulty hearing?: Yes Does the patient have difficulty seeing, even when wearing  glasses/contacts?: Yes Does the patient have difficulty concentrating, remembering, or making decisions?: No Does the patient have difficulty walking or climbing stairs?:  Yes Does the patient have difficulty dressing or bathing?: No Does the patient have difficulty doing errands alone such as visiting a doctor's office or shopping?: No    Assessment & Plan  1. Atherosclerosis of abdominal aorta  On statin therapy and zetia   2. CKD (chronic kidney disease), stage V  Refuses HD or even getting a graft   3. Secondary hyperparathyroidism of renal origin  Keep follow up with Dr. Cherylann Ratel  4. Anemia of chronic disease  Reviewed recent labs  5. History of renal artery stenosis  Refusing refills   6. History of non-ST elevation myocardial infarction (NSTEMI)  - clopidogrel (PLAVIX) 75 MG tablet; TAKE 1 TABLET BY MOUTH ONCE DAILY .  BLOOD  THINNER  TO  HELP  REDUCE  STROKE  RISK  Dispense: 90 tablet; Refill: 1  7. History of lacunar cerebrovascular accident (CVA)  - clopidogrel (PLAVIX) 75 MG tablet; TAKE 1 TABLET BY MOUTH ONCE DAILY .  BLOOD  THINNER  TO  HELP  REDUCE  STROKE  RISK  Dispense: 90 tablet; Refill: 1  8. Hypothyroidism, postsurgical  - levothyroxine (SYNTHROID) 75 MCG tablet; Take 1 tablet (75 mcg total) by mouth daily before breakfast.  Dispense: 90 tablet; Refill: 1  9. Dyslipidemia  On statin therapy and zetia  10. Hypertension, benign  - cloNIDine (CATAPRES) 0.2 MG tablet; Take 1 tablet (0.2 mg total) by mouth 2 (two) times daily.  Dispense: 180 tablet; Refill: 1  11. Prediabetes  Discussed low carb diet   12. History of stent insertion of renal artery  - ezetimibe (ZETIA) 10 MG tablet; Take 1 tablet (10 mg total) by mouth daily.  Dispense: 90 tablet; Refill: 1

## 2023-01-05 ENCOUNTER — Ambulatory Visit (INDEPENDENT_AMBULATORY_CARE_PROVIDER_SITE_OTHER): Payer: Medicare Other | Admitting: Family Medicine

## 2023-01-05 ENCOUNTER — Encounter: Payer: Self-pay | Admitting: Family Medicine

## 2023-01-05 VITALS — BP 132/70 | HR 75 | Resp 16 | Ht 64.0 in | Wt 167.0 lb

## 2023-01-05 DIAGNOSIS — Z8679 Personal history of other diseases of the circulatory system: Secondary | ICD-10-CM

## 2023-01-05 DIAGNOSIS — D638 Anemia in other chronic diseases classified elsewhere: Secondary | ICD-10-CM

## 2023-01-05 DIAGNOSIS — I700 Atherosclerosis of aorta: Secondary | ICD-10-CM

## 2023-01-05 DIAGNOSIS — E785 Hyperlipidemia, unspecified: Secondary | ICD-10-CM

## 2023-01-05 DIAGNOSIS — Z8673 Personal history of transient ischemic attack (TIA), and cerebral infarction without residual deficits: Secondary | ICD-10-CM

## 2023-01-05 DIAGNOSIS — Z9889 Other specified postprocedural states: Secondary | ICD-10-CM

## 2023-01-05 DIAGNOSIS — E890 Postprocedural hypothyroidism: Secondary | ICD-10-CM

## 2023-01-05 DIAGNOSIS — N185 Chronic kidney disease, stage 5: Secondary | ICD-10-CM

## 2023-01-05 DIAGNOSIS — N2581 Secondary hyperparathyroidism of renal origin: Secondary | ICD-10-CM

## 2023-01-05 DIAGNOSIS — R7303 Prediabetes: Secondary | ICD-10-CM

## 2023-01-05 DIAGNOSIS — I10 Essential (primary) hypertension: Secondary | ICD-10-CM

## 2023-01-05 DIAGNOSIS — I252 Old myocardial infarction: Secondary | ICD-10-CM

## 2023-01-05 DIAGNOSIS — F325 Major depressive disorder, single episode, in full remission: Secondary | ICD-10-CM

## 2023-01-05 MED ORDER — CLOPIDOGREL BISULFATE 75 MG PO TABS: ORAL_TABLET | ORAL | 1 refills | Status: DC

## 2023-01-05 MED ORDER — EZETIMIBE 10 MG PO TABS: 10.0000 mg | ORAL_TABLET | Freq: Every day | ORAL | 1 refills | Status: DC

## 2023-01-05 MED ORDER — LEVOTHYROXINE SODIUM 75 MCG PO TABS: 75.0000 ug | ORAL_TABLET | Freq: Every day | ORAL | 1 refills | Status: DC

## 2023-01-05 MED ORDER — CLONIDINE HCL 0.2 MG PO TABS: 0.2000 mg | ORAL_TABLET | Freq: Two times a day (BID) | ORAL | 1 refills | Status: DC

## 2023-01-09 ENCOUNTER — Other Ambulatory Visit: Payer: Self-pay | Admitting: Family Medicine

## 2023-01-10 ENCOUNTER — Other Ambulatory Visit: Payer: Self-pay

## 2023-02-23 ENCOUNTER — Emergency Department: Payer: Medicare Other

## 2023-02-23 ENCOUNTER — Other Ambulatory Visit: Payer: Self-pay

## 2023-02-23 ENCOUNTER — Emergency Department
Admission: EM | Admit: 2023-02-23 | Discharge: 2023-02-23 | Disposition: A | Discharge status: Home / Self Care | Payer: Medicare Other | Attending: Emergency Medicine | Admitting: Emergency Medicine

## 2023-02-23 DIAGNOSIS — I129 Hypertensive chronic kidney disease with stage 1 through stage 4 chronic kidney disease, or unspecified chronic kidney disease: Secondary | ICD-10-CM | POA: Diagnosis not present

## 2023-02-23 DIAGNOSIS — M25551 Pain in right hip: Secondary | ICD-10-CM | POA: Diagnosis not present

## 2023-02-23 DIAGNOSIS — N189 Chronic kidney disease, unspecified: Secondary | ICD-10-CM | POA: Insufficient documentation

## 2023-02-23 DIAGNOSIS — M545 Low back pain, unspecified: Secondary | ICD-10-CM | POA: Diagnosis present

## 2023-02-23 DIAGNOSIS — I10 Essential (primary) hypertension: Secondary | ICD-10-CM

## 2023-02-23 DIAGNOSIS — R8290 Unspecified abnormal findings in urine: Secondary | ICD-10-CM | POA: Diagnosis not present

## 2023-02-23 DIAGNOSIS — K5730 Diverticulosis of large intestine without perforation or abscess without bleeding: Secondary | ICD-10-CM | POA: Insufficient documentation

## 2023-02-23 LAB — COMPREHENSIVE METABOLIC PANEL
ALT: 12 U/L (ref 0–44)
AST: 21 U/L (ref 15–41)
Albumin: 3.8 g/dL (ref 3.5–5.0)
Alkaline Phosphatase: 75 U/L (ref 38–126)
Anion gap: 10 (ref 5–15)
BUN: 54 mg/dL — ABNORMAL HIGH (ref 8–23)
CO2: 19 mmol/L — ABNORMAL LOW (ref 22–32)
Calcium: 9.6 mg/dL (ref 8.9–10.3)
Chloride: 109 mmol/L (ref 98–111)
Creatinine, Ser: 3.56 mg/dL — ABNORMAL HIGH (ref 0.44–1.00)
GFR, Estimated: 13 mL/min — ABNORMAL LOW (ref >60)
Glucose, Bld: 99 mg/dL (ref 70–99)
Potassium: 4.6 mmol/L (ref 3.5–5.1)
Sodium: 138 mmol/L (ref 135–145)
Total Bilirubin: 0.6 mg/dL (ref 0.3–1.2)
Total Protein: 6.6 g/dL (ref 6.5–8.1)

## 2023-02-23 LAB — CBC WITH DIFFERENTIAL/PLATELET
Abs Immature Granulocytes: 0.02 10*3/uL (ref 0.00–0.07)
Basophils Absolute: 0.1 10*3/uL (ref 0.0–0.1)
Basophils Relative: 1 %
Eosinophils Absolute: 0.5 10*3/uL (ref 0.0–0.5)
Eosinophils Relative: 8 %
HCT: 27.1 % — ABNORMAL LOW (ref 36.0–46.0)
Hemoglobin: 8.4 g/dL — ABNORMAL LOW (ref 12.0–15.0)
Immature Granulocytes: 0 %
Lymphocytes Relative: 30 %
Lymphs Abs: 1.9 10*3/uL (ref 0.7–4.0)
MCH: 30.5 pg (ref 26.0–34.0)
MCHC: 31 g/dL (ref 30.0–36.0)
MCV: 98.5 fL (ref 80.0–100.0)
Monocytes Absolute: 0.6 10*3/uL (ref 0.1–1.0)
Monocytes Relative: 10 %
Neutro Abs: 3.2 10*3/uL (ref 1.7–7.7)
Neutrophils Relative %: 51 %
Platelets: 198 10*3/uL (ref 150–400)
RBC: 2.75 MIL/uL — ABNORMAL LOW (ref 3.87–5.11)
RDW: 12.7 % (ref 11.5–15.5)
WBC: 6.3 10*3/uL (ref 4.0–10.5)
nRBC: 0 % (ref 0.0–0.2)

## 2023-02-23 LAB — URINALYSIS, ROUTINE W REFLEX MICROSCOPIC
Bilirubin Urine: NEGATIVE
Glucose, UA: NEGATIVE mg/dL
Ketones, ur: NEGATIVE mg/dL
Nitrite: NEGATIVE
Protein, ur: 100 mg/dL — AB
Specific Gravity, Urine: 1.011 (ref 1.005–1.030)
Squamous Epithelial / HPF: NONE SEEN /HPF (ref 0–5)
pH: 5 (ref 5.0–8.0)

## 2023-02-23 MED ORDER — ONDANSETRON HCL 4 MG/2ML IJ SOLN
4.0000 mg | INTRAMUSCULAR | Status: AC
  Administered 2023-02-23: 4 mg via INTRAVENOUS
  Filled 2023-02-23: qty 2

## 2023-02-23 MED ORDER — FENTANYL CITRATE PF 50 MCG/ML IJ SOSY
12.5000 ug | PREFILLED_SYRINGE | Freq: Once | INTRAMUSCULAR | Status: AC
  Administered 2023-02-23: 12.5 ug via INTRAVENOUS
  Filled 2023-02-23: qty 1

## 2023-02-23 MED ORDER — CLONIDINE HCL 0.1 MG PO TABS
0.2000 mg | ORAL_TABLET | Freq: Once | ORAL | Status: AC
  Administered 2023-02-23: 0.2 mg via ORAL
  Filled 2023-02-23: qty 2

## 2023-02-23 NOTE — ED Provider Notes (Signed)
[facility] Provider Note    Event Date/Time   First MD Initiated Contact with Patient 02/23/23 [phone]     (approximate)   History   Back Pain   HPI Betty Nielsen is a 77 y.o. female with extensive chronic medical history who presents for evaluation of pain in her right hip or buttock.  She did not have any fall of which she is aware.  She said that the pain started yesterday and got worse yesterday evening.  Nothing in particular seems to make it better but it does get worse when she changes positions.  She is able to ambulate and bear weight but hurts to do so.  She has extensive chronic kidney disease and sees multiple specialist but admits that she has passed on some of the treatment or further evaluation that they have recommended.  She said she has been compliant with her antihypertensive medications and is able to tell me that she takes both clonidine and labetalol.     Physical Exam   Triage Vital Signs: ED Triage Vitals  Enc Vitals Group     BP 02/23/23 0131 (!) 231/109     Pulse Rate 02/23/23 0131 66     Resp 02/23/23 0131 18     Temp 02/23/23 0131 97.7 F (36.5 C)     Temp Source 02/23/23 0131 Oral     SpO2 02/23/23 0131 100 %     Weight 02/23/23 0135 80.2 kg (176 lb 12.9 oz)     Height 02/23/23 0135 1.626 m (5\' 4" )     Head Circumference --      Peak Flow --      Pain Score 02/23/23 0135 10     Pain Loc --      Pain Edu? --      Excl. in GC? --     Most recent vital signs: Vitals:   02/23/23 0500 02/23/23 0530  BP: (!) 198/92 (!) 186/79  Pulse: 65 62  Resp: 17 13  Temp:    SpO2: 100% 100%    General: Awake, no obvious distress, calm and cooperative, communicative, smiling during our interaction. CV:  Good peripheral perfusion.  Regular rate and rhythm.  Normal heart sounds. Resp:  Normal effort. Speaking easily and comfortably, no accessory muscle usage nor intercostal retractions.  Lungs clear to auscultation. Abd:  No  distention.  Other:  Patient has some tenderness to palpation of the top right buttock and the middle of the buttock.  No palpable deformities.  No tenderness to manipulation of the pelvis and no tenderness along the superior iliac crest on the right side.  I am able to passively range the patient's right hip and knee and it does recreate some tenderness in the right buttock.   ED Results / Procedures / Treatments   Labs (all labs ordered are listed, but only abnormal results are displayed) Labs Reviewed  COMPREHENSIVE METABOLIC PANEL - Abnormal; Notable for the following components:      Result Value   CO2 19 (*)    BUN 54 (*)    Creatinine, Ser 3.56 (*)    GFR, Estimated 13 (*)    All other components within normal limits  CBC WITH DIFFERENTIAL/PLATELET - Abnormal; Notable for the following components:   RBC 2.75 (*)    Hemoglobin 8.4 (*)    HCT 27.1 (*)    All other components within normal limits  URINALYSIS, ROUTINE W REFLEX MICROSCOPIC - Abnormal; Notable for  the following components:   Color, Urine YELLOW (*)    APPearance CLEAR (*)    Hgb urine dipstick SMALL (*)    Protein, ur 100 (*)    Leukocytes,Ua SMALL (*)    Bacteria, UA RARE (*)    All other components within normal limits  URINE CULTURE     EKG  ED ECG REPORT I, [name], the attending physician, personally viewed and interpreted this ECG.  Date: 02/23/2023 EKG Time: 1:56 AM Rate: 64 Rhythm: normal sinus rhythm QRS Axis: normal Intervals: normal ST/T Wave abnormalities: normal Narrative Interpretation: no evidence of acute ischemia    RADIOLOGY I viewed and interpreted the patient's CT of the abdomen pelvis without contrast.  See hospital course for details.  No acute/emergent explanation for the patient's symptoms.   PROCEDURES:  Critical Care performed: No  .1-3 Lead EKG Interpretation  Performed by: [name], MD Authorized by: [name], MD     Interpretation: normal      ECG rate:  63   ECG rate assessment: normal     Rhythm: sinus rhythm     Ectopy: none     Conduction: normal       IMPRESSION / MDM / ASSESSMENT AND PLAN / ED COURSE  I reviewed the triage vital signs and the nursing notes.                              Differential diagnosis includes, but is not limited to, fracture, dislocation, sciatica, radiculopathy, abscess or other infection.  Patient's presentation is most consistent with acute presentation with potential threat to life or bodily function.  Labs/studies ordered: CBC with differential, CMP, urinalysis, urine culture, CT abdomen/pelvis without contrast, EKG  Interventions/Medications given:  Medications  ondansetron (ZOFRAN) injection 4 mg (4 mg Intravenous Given 02/23/23 0249)  fentaNYL (SUBLIMAZE) injection 12.5 mcg (12.5 mcg Intravenous Given 02/23/23 0250)  cloNIDine (CATAPRES) tablet 0.2 mg (0.2 mg Oral Given 02/23/23 0401)    (Note:  hospital course my include additional interventions and/or labs/studies not listed above.)   I verified that the patient has substantial chronic kidney disease and I read multiple notes from her primary care and other providers indicating that in the past she has declined follow-up, for example, with Dr. Juliann Pares with cardiology, and declined to have various examinations and procedures with vascular surgery.  Her kidney disease is relatively stable with a GFR of 13.  Her labs are otherwise essentially normal and the patient was able to provide a urine specimen while in the ED which was not clearly infected and she has no urinary symptoms.  I sent a culture but will not treat empirically.  Her CBC is normal or stable for her with chronic anemia.  Given her symptoms I will proceed with CT of the abdomen pelvis without contrast so that I can both evaluate internally for any acute obstructive or infective process as well as to evaluate the patient's pelvis in the area of most discomfort to make sure she  does not have an occult fracture of the pelvis.  Patient understands and agrees with the plan.  Given her poor kidney function at baseline, I ordered fentanyl 12.5 mcg IV as well as Zofran to try and prevent nausea.   Most notable on her evaluation is her substantially elevated blood pressure.  This seems to likely be asymptomatic as it is unlikely to be causing pain in her right buttock, but it  is different from baseline.  She has been hypertensive in the past based on prior clinic notes, but never with such a high both systolic and diastolic pressure.  In case she missed a dose of clonidine, I ordered clonidine 0.2 mg p.o. and then will also reassess if treating the pain helps her blood pressure comes down.  The patient is on the cardiac monitor to evaluate for evidence of arrhythmia and/or significant heart rate changes.   Clinical Course as of 02/23/23 0751  Wed Feb 23, 2023  0458 I viewed and CT scan.  I see no evidence of any bony abnormality such as a fracture in the pelvis or the hip.  The kidneys have numerous cysts and the radiologist commented on this and the need for outpatient follow-up and a nonemergent basis for renal ultrasound, but there is no indication that any of the abnormalities visible tonight on the CT scan are causing her acute pain.  The patient has been sleeping comfortably.  I woke her up easily and she said that she feels better than she did before although she still has some discomfort.  We talked a little bit about the prior lumbar issues she has had and she acknowledges that she has pain that radiates down from her lower back into the upper part of her buttocks, much like the pain she is experiencing now.  I believe that this is an exacerbation of some sciatica or nonspecific lumbar nerve root discomfort and not representative of an emergent medical condition currently.  However, I talked with her again about her substantially elevated blood pressure.  Even after I  ordered an additional dose of clonidine 0.2 mg p.o., her blood pressure remains elevated at greater than 200 systolic.  I explained to her that she does not appear to be having any emergent complications of the blood pressure, but it is high enough that we could treat more aggressively and that I could admit her to the hospital for further management of her blood pressure.  She adamantly declines this.  I explained that it can have both short and long-term consequences, but she does not want to stay in the hospital and says she is perfectly comfortable following up with her primary care doctor.  Regardless of her age and medical conditions, she has the capacity to make her own decisions.  She understands the consequences but is adamant about going home and based on the note I read from her primary care doctor, this seems consistent with decision she has made in the past. [CF]  0501  She already takes labetalol 200 mg twice daily and I do not think that there is much therapeutic benefit to taking additional dose.  Additionally, her heart rate is already around 60 and I do not want to lower it further.  She is comfortable taking her regular medications first thing in the morning and calling her primary care doctor.  She declines any additional blood pressure medicines at this time.  I gave strict return precautions and follow-up recommendations and she understands and agrees with the plan. [CF]    Clinical Course User Index [CF] [name], MD     FINAL CLINICAL IMPRESSION(S) / ED DIAGNOSES   Final diagnoses:  Acute right hip pain  Uncontrolled hypertension     Rx / DC Orders   ED Discharge Orders     None        Note:  This document was prepared using Dragon voice recognition software and may  include unintentional dictation errors.   [name], MD 02/23/23 [phone]

## 2023-02-23 NOTE — ED Triage Notes (Signed)
Pt presents to ER with c/o right lower back/buttock pain that started yesterday, and became around 2200 yesterday.  Pt reports pain does not radiate, and denies any associated urinary issues.  Pt does state her pain is made worse b changing positions.  Pt is otherwise A&O x4 and in NAD.

## 2023-02-23 NOTE — ED Notes (Signed)
Report off to tracey rn

## 2023-02-23 NOTE — Discharge Instructions (Signed)
As we discussed, a thorough medical workup did not reveal any specific causes of your hip pain.  We believe this is likely related to some nerve root compression due to some chronic back problems.  Your CT scan did not identify any new issues, but confirmed that you should proceed with an outpatient renal ultrasound to further evaluate your kidney disease in the presence of numerous cysts in your kidneys, which likely have to do with your chronic kidney disease.  However, your blood pressure was extremely elevated in the emergency department.  We gave you an additional dose of clonidine which did not substantially change the blood pressure, and it was still running over 200 systolic.  We offered to admit you to the hospital for blood pressure control.  However, given that you are not having any acute or emergent signs or symptoms as a result of your uncontrolled blood pressure, your preference is to go home, and we respect your decision making capacity.  However it is very important that you call your primary care doctor at the next available opportunity to schedule a follow-up appointment.  You may need a change to your chronic blood pressure medicine.    Return to the emergency department if you develop new or worsening symptoms that concern you.

## 2023-02-24 LAB — URINE CULTURE: Culture: NO GROWTH

## 2023-03-09 DIAGNOSIS — R809 Proteinuria, unspecified: Secondary | ICD-10-CM | POA: Insufficient documentation

## 2023-03-09 DIAGNOSIS — I129 Hypertensive chronic kidney disease with stage 1 through stage 4 chronic kidney disease, or unspecified chronic kidney disease: Secondary | ICD-10-CM | POA: Insufficient documentation

## 2023-04-15 ENCOUNTER — Other Ambulatory Visit: Payer: Self-pay | Admitting: Family Medicine

## 2023-04-15 DIAGNOSIS — Z8673 Personal history of transient ischemic attack (TIA), and cerebral infarction without residual deficits: Secondary | ICD-10-CM

## 2023-04-15 DIAGNOSIS — I252 Old myocardial infarction: Secondary | ICD-10-CM

## 2023-04-15 MED ORDER — CLOPIDOGREL BISULFATE 75 MG PO TABS
ORAL_TABLET | ORAL | 1 refills | Status: DC
Start: 2023-04-15 — End: 2023-12-30

## 2023-04-15 NOTE — Telephone Encounter (Signed)
Medication Refill - Medication: clopidogrel (PLAVIX) 75 MG tablet   Has the patient contacted their pharmacy? No.   Preferred Pharmacy (with phone number or street name):  [facility] [address] [place]), [place] - [address] Phone: [phone]  Fax: [phone]     Has the patient been seen for an appointment in the last year OR does the patient have an upcoming appointment? Yes.    Agent: Please be advised that RX refills may take up to 3 business days. We ask that you follow-up with your pharmacy.

## 2023-04-15 NOTE — Telephone Encounter (Signed)
Unable to refill per protocol, Rx request is too soon. Last refill 01/05/23 for 90 and 1 refill.  Requested Prescriptions  Pending Prescriptions Disp Refills   clopidogrel (PLAVIX) 75 MG tablet 90 tablet 1    Sig: TAKE 1 TABLET BY MOUTH ONCE DAILY .  BLOOD  THINNER  TO  HELP  REDUCE  STROKE  RISK     Hematology: Antiplatelets - clopidogrel Failed - 04/15/2023 11:26 AM      Failed - HCT in normal range and within 180 days    HCT  Date Value Ref Range Status  02/23/2023 27.1 (L) 36.0 - 46.0 % Final  04/04/2012 33.8 (L) 35.0 - 47.0 % Final         Failed - HGB in normal range and within 180 days    Hemoglobin  Date Value Ref Range Status  02/23/2023 8.4 (L) 12.0 - 15.0 g/dL Final   HGB  Date Value Ref Range Status  04/04/2012 11.3 (L) 12.0 - 16.0 g/dL Final         Failed - Cr in normal range and within 360 days    Creat  Date Value Ref Range Status  06/18/2020 1.82 (H) 0.60 - 0.93 mg/dL Final    Comment:    For patients >35 years of age, the reference limit for Creatinine is approximately 13% higher for people identified as African-American. .    Creatinine, Ser  Date Value Ref Range Status  02/23/2023 3.56 (H) 0.44 - 1.00 mg/dL Final   Creatinine, Urine  Date Value Ref Range Status  06/07/2021 29 mg/dL Final    Comment:    Performed at [facility], [address] [place], [place] [address]         Passed - PLT in normal range and within 180 days    Platelets  Date Value Ref Range Status  02/23/2023 198 150 - 400 K/uL Final   Platelet  Date Value Ref Range Status  04/04/2012 323 150 - 440 x10 3/mm 3 Final         Passed - Valid encounter within last 6 months    Recent Outpatient Visits           3 months ago Atherosclerosis of abdominal aorta [facility])   [place] [facility] Alba Cory, MD   7 months ago Encounter for [employer] annual wellness exam   [facility] Mecum, Oswaldo Conroy, PA-C   9 months ago  Chronic kidney disease, stage IV (severe) [facility])   [place] [facility] Danelle Berry, PA-C   1 year ago Atherosclerosis of abdominal aorta [facility])   [facility] Alba Cory, MD   1 year ago Stage 3b chronic kidney disease [facility])   [facility] Alba Cory, MD       Future Appointments             In 2 months Carlynn Purl, Danna Hefty, MD [facility]   In 4 months  [facility]

## 2023-06-25 ENCOUNTER — Observation Stay
Admission: EM | Admit: 2023-06-25 | Discharge: 2023-06-29 | Disposition: A | Discharge status: Home Health | Payer: Medicare Other | Attending: Internal Medicine | Admitting: Internal Medicine

## 2023-06-25 ENCOUNTER — Other Ambulatory Visit: Payer: Self-pay

## 2023-06-25 ENCOUNTER — Emergency Department: Payer: Medicare Other

## 2023-06-25 DIAGNOSIS — Z8673 Personal history of transient ischemic attack (TIA), and cerebral infarction without residual deficits: Secondary | ICD-10-CM

## 2023-06-25 DIAGNOSIS — G629 Polyneuropathy, unspecified: Secondary | ICD-10-CM | POA: Diagnosis not present

## 2023-06-25 DIAGNOSIS — M48061 Spinal stenosis, lumbar region without neurogenic claudication: Secondary | ICD-10-CM | POA: Insufficient documentation

## 2023-06-25 DIAGNOSIS — G9341 Metabolic encephalopathy: Secondary | ICD-10-CM | POA: Insufficient documentation

## 2023-06-25 DIAGNOSIS — N184 Chronic kidney disease, stage 4 (severe): Secondary | ICD-10-CM | POA: Insufficient documentation

## 2023-06-25 DIAGNOSIS — Z955 Presence of coronary angioplasty implant and graft: Secondary | ICD-10-CM | POA: Diagnosis not present

## 2023-06-25 DIAGNOSIS — E8721 Acute metabolic acidosis: Secondary | ICD-10-CM | POA: Insufficient documentation

## 2023-06-25 DIAGNOSIS — I160 Hypertensive urgency: Principal | ICD-10-CM | POA: Diagnosis present

## 2023-06-25 DIAGNOSIS — E890 Postprocedural hypothyroidism: Secondary | ICD-10-CM | POA: Diagnosis present

## 2023-06-25 DIAGNOSIS — F325 Major depressive disorder, single episode, in full remission: Secondary | ICD-10-CM | POA: Diagnosis present

## 2023-06-25 DIAGNOSIS — E039 Hypothyroidism, unspecified: Secondary | ICD-10-CM | POA: Insufficient documentation

## 2023-06-25 DIAGNOSIS — E875 Hyperkalemia: Secondary | ICD-10-CM | POA: Insufficient documentation

## 2023-06-25 DIAGNOSIS — K219 Gastro-esophageal reflux disease without esophagitis: Secondary | ICD-10-CM | POA: Diagnosis present

## 2023-06-25 DIAGNOSIS — D631 Anemia in chronic kidney disease: Secondary | ICD-10-CM | POA: Insufficient documentation

## 2023-06-25 DIAGNOSIS — I129 Hypertensive chronic kidney disease with stage 1 through stage 4 chronic kidney disease, or unspecified chronic kidney disease: Secondary | ICD-10-CM | POA: Insufficient documentation

## 2023-06-25 DIAGNOSIS — Z7902 Long term (current) use of antithrombotics/antiplatelets: Secondary | ICD-10-CM | POA: Diagnosis not present

## 2023-06-25 DIAGNOSIS — M5416 Radiculopathy, lumbar region: Principal | ICD-10-CM | POA: Insufficient documentation

## 2023-06-25 DIAGNOSIS — I10 Essential (primary) hypertension: Secondary | ICD-10-CM | POA: Diagnosis present

## 2023-06-25 DIAGNOSIS — Z7982 Long term (current) use of aspirin: Secondary | ICD-10-CM | POA: Insufficient documentation

## 2023-06-25 DIAGNOSIS — D638 Anemia in other chronic diseases classified elsewhere: Secondary | ICD-10-CM | POA: Diagnosis present

## 2023-06-25 DIAGNOSIS — Z9861 Coronary angioplasty status: Secondary | ICD-10-CM

## 2023-06-25 DIAGNOSIS — R52 Pain, unspecified: Secondary | ICD-10-CM | POA: Diagnosis not present

## 2023-06-25 DIAGNOSIS — N2581 Secondary hyperparathyroidism of renal origin: Secondary | ICD-10-CM | POA: Diagnosis present

## 2023-06-25 DIAGNOSIS — Z87891 Personal history of nicotine dependence: Secondary | ICD-10-CM | POA: Insufficient documentation

## 2023-06-25 DIAGNOSIS — I714 Abdominal aortic aneurysm, without rupture, unspecified: Secondary | ICD-10-CM | POA: Diagnosis present

## 2023-06-25 DIAGNOSIS — E785 Hyperlipidemia, unspecified: Secondary | ICD-10-CM | POA: Diagnosis present

## 2023-06-25 DIAGNOSIS — M4800 Spinal stenosis, site unspecified: Secondary | ICD-10-CM | POA: Diagnosis present

## 2023-06-25 DIAGNOSIS — M25551 Pain in right hip: Secondary | ICD-10-CM | POA: Diagnosis present

## 2023-06-25 DIAGNOSIS — Z79899 Other long term (current) drug therapy: Secondary | ICD-10-CM | POA: Insufficient documentation

## 2023-06-25 DIAGNOSIS — E872 Acidosis, unspecified: Secondary | ICD-10-CM | POA: Diagnosis not present

## 2023-06-25 DIAGNOSIS — N185 Chronic kidney disease, stage 5: Secondary | ICD-10-CM

## 2023-06-25 LAB — COMPREHENSIVE METABOLIC PANEL
ALT: 12 U/L (ref 0–44)
AST: 23 U/L (ref 15–41)
Albumin: 3.6 g/dL (ref 3.5–5.0)
Alkaline Phosphatase: 67 U/L (ref 38–126)
Anion gap: 9 (ref 5–15)
BUN: 50 mg/dL — ABNORMAL HIGH (ref 8–23)
CO2: 17 mmol/L — ABNORMAL LOW (ref 22–32)
Calcium: 8.9 mg/dL (ref 8.9–10.3)
Chloride: 109 mmol/L (ref 98–111)
Creatinine, Ser: 3.58 mg/dL — ABNORMAL HIGH (ref 0.44–1.00)
GFR, Estimated: 13 mL/min — ABNORMAL LOW (ref >60)
Glucose, Bld: 99 mg/dL (ref 70–99)
Potassium: 4.3 mmol/L (ref 3.5–5.1)
Sodium: 135 mmol/L (ref 135–145)
Total Bilirubin: 0.8 mg/dL (ref 0.3–1.2)
Total Protein: 6.7 g/dL (ref 6.5–8.1)

## 2023-06-25 LAB — CBC WITH DIFFERENTIAL/PLATELET
Abs Immature Granulocytes: 0.02 10*3/uL (ref 0.00–0.07)
Basophils Absolute: 0.1 10*3/uL (ref 0.0–0.1)
Basophils Relative: 1 %
Eosinophils Absolute: 0.8 10*3/uL — ABNORMAL HIGH (ref 0.0–0.5)
Eosinophils Relative: 11 %
HCT: 24.7 % — ABNORMAL LOW (ref 36.0–46.0)
Hemoglobin: 8.1 g/dL — ABNORMAL LOW (ref 12.0–15.0)
Immature Granulocytes: 0 %
Lymphocytes Relative: 19 %
Lymphs Abs: 1.4 10*3/uL (ref 0.7–4.0)
MCH: 30.9 pg (ref 26.0–34.0)
MCHC: 32.8 g/dL (ref 30.0–36.0)
MCV: 94.3 fL (ref 80.0–100.0)
Monocytes Absolute: 0.6 10*3/uL (ref 0.1–1.0)
Monocytes Relative: 8 %
Neutro Abs: 4.4 10*3/uL (ref 1.7–7.7)
Neutrophils Relative %: 61 %
Platelets: 205 10*3/uL (ref 150–400)
RBC: 2.62 MIL/uL — ABNORMAL LOW (ref 3.87–5.11)
RDW: 13.3 % (ref 11.5–15.5)
WBC: 7.2 10*3/uL (ref 4.0–10.5)
nRBC: 0 % (ref 0.0–0.2)

## 2023-06-25 LAB — URINALYSIS, ROUTINE W REFLEX MICROSCOPIC
Bacteria, UA: NONE SEEN
Bilirubin Urine: NEGATIVE
Glucose, UA: NEGATIVE mg/dL
Ketones, ur: NEGATIVE mg/dL
Leukocytes,Ua: NEGATIVE
Nitrite: NEGATIVE
Protein, ur: 100 mg/dL — AB
Specific Gravity, Urine: 1.009 (ref 1.005–1.030)
Squamous Epithelial / HPF: 0 /[HPF] (ref 0–5)
pH: 5 (ref 5.0–8.0)

## 2023-06-25 LAB — LACTIC ACID, PLASMA: Lactic Acid, Venous: 0.9 mmol/L (ref 0.5–1.9)

## 2023-06-25 MED ORDER — GABAPENTIN 300 MG PO CAPS
300.0000 mg | ORAL_CAPSULE | Freq: Three times a day (TID) | ORAL | Status: AC
  Administered 2023-06-25 – 2023-06-28 (×9): 300 mg via ORAL
  Filled 2023-06-25 (×9): qty 1

## 2023-06-25 MED ORDER — ATORVASTATIN CALCIUM 20 MG PO TABS
40.0000 mg | ORAL_TABLET | Freq: Every evening | ORAL | Status: DC
  Administered 2023-06-25 – 2023-06-28 (×4): 40 mg via ORAL
  Filled 2023-06-25 (×4): qty 2

## 2023-06-25 MED ORDER — ACETAMINOPHEN 650 MG RE SUPP: 650.0000 mg | Freq: Four times a day (QID) | RECTAL | Status: DC | PRN

## 2023-06-25 MED ORDER — HYDRALAZINE HCL 20 MG/ML IJ SOLN
10.0000 mg | Freq: Once | INTRAMUSCULAR | Status: AC
  Administered 2023-06-25: 10 mg via INTRAVENOUS
  Filled 2023-06-25: qty 1

## 2023-06-25 MED ORDER — HYDROMORPHONE HCL 1 MG/ML IJ SOLN
0.5000 mg | Freq: Once | INTRAMUSCULAR | Status: AC
  Administered 2023-06-25: 0.5 mg via INTRAVENOUS
  Filled 2023-06-25: qty 0.5

## 2023-06-25 MED ORDER — ONDANSETRON HCL 4 MG/2ML IJ SOLN
4.0000 mg | Freq: Once | INTRAMUSCULAR | Status: AC
  Administered 2023-06-25: 4 mg via INTRAVENOUS
  Filled 2023-06-25: qty 2

## 2023-06-25 MED ORDER — CLONIDINE HCL 0.1 MG PO TABS
0.2000 mg | ORAL_TABLET | Freq: Once | ORAL | Status: AC
  Administered 2023-06-25: 0.2 mg via ORAL
  Filled 2023-06-25: qty 2

## 2023-06-25 MED ORDER — ONDANSETRON HCL 4 MG/2ML IJ SOLN: 4.0000 mg | Freq: Four times a day (QID) | INTRAMUSCULAR | Status: DC | PRN

## 2023-06-25 MED ORDER — SENNOSIDES-DOCUSATE SODIUM 8.6-50 MG PO TABS: 1.0000 | ORAL_TABLET | Freq: Every evening | ORAL | Status: DC | PRN

## 2023-06-25 MED ORDER — CYCLOBENZAPRINE HCL 10 MG PO TABS
5.0000 mg | ORAL_TABLET | Freq: Three times a day (TID) | ORAL | Status: AC
  Administered 2023-06-25 – 2023-06-26 (×3): 5 mg via ORAL
  Filled 2023-06-25 (×3): qty 1

## 2023-06-25 MED ORDER — HEPARIN SODIUM (PORCINE) 5000 UNIT/ML IJ SOLN: 5000.0000 [IU] | Freq: Three times a day (TID) | INTRAMUSCULAR | Status: AC

## 2023-06-25 MED ORDER — ACETAMINOPHEN 325 MG PO TABS: 650.0000 mg | ORAL_TABLET | Freq: Four times a day (QID) | ORAL | Status: DC | PRN

## 2023-06-25 MED ORDER — LEVOTHYROXINE SODIUM 50 MCG PO TABS
75.0000 ug | ORAL_TABLET | Freq: Every day | ORAL | Status: DC
  Administered 2023-06-27 – 2023-06-29 (×3): 75 ug via ORAL
  Filled 2023-06-25 (×4): qty 1

## 2023-06-25 MED ORDER — LABETALOL HCL 200 MG PO TABS
200.0000 mg | ORAL_TABLET | Freq: Two times a day (BID) | ORAL | Status: DC
  Administered 2023-06-25 – 2023-06-27 (×6): 200 mg via ORAL
  Filled 2023-06-25 (×8): qty 1

## 2023-06-25 MED ORDER — HYDRALAZINE HCL 20 MG/ML IJ SOLN
5.0000 mg | INTRAMUSCULAR | Status: DC | PRN
  Administered 2023-06-25 – 2023-06-26 (×2): 5 mg via INTRAVENOUS
  Filled 2023-06-25 (×2): qty 1

## 2023-06-25 MED ORDER — SODIUM BICARBONATE 650 MG PO TABS
325.0000 mg | ORAL_TABLET | Freq: Two times a day (BID) | ORAL | Status: AC
  Administered 2023-06-25 – 2023-06-26 (×3): 325 mg via ORAL
  Filled 2023-06-25 (×3): qty 1

## 2023-06-25 MED ORDER — HYDROMORPHONE HCL 1 MG/ML IJ SOLN
0.5000 mg | INTRAMUSCULAR | Status: DC | PRN
  Administered 2023-06-25: 0.5 mg via INTRAVENOUS
  Filled 2023-06-25: qty 0.5

## 2023-06-25 MED ORDER — VITAMIN D 25 MCG (1000 UNIT) PO TABS
5000.0000 [IU] | ORAL_TABLET | Freq: Every day | ORAL | Status: DC
  Administered 2023-06-26 – 2023-06-29 (×4): 5000 [IU] via ORAL
  Filled 2023-06-25 (×5): qty 5

## 2023-06-25 MED ORDER — FENTANYL CITRATE PF 50 MCG/ML IJ SOSY: 25.0000 ug | PREFILLED_SYRINGE | INTRAMUSCULAR | Status: AC | PRN

## 2023-06-25 MED ORDER — CLONIDINE HCL 0.1 MG PO TABS
0.2000 mg | ORAL_TABLET | Freq: Two times a day (BID) | ORAL | Status: DC
  Administered 2023-06-25 – 2023-06-27 (×5): 0.2 mg via ORAL
  Filled 2023-06-25 (×5): qty 2

## 2023-06-25 MED ORDER — ONDANSETRON HCL 4 MG PO TABS
4.0000 mg | ORAL_TABLET | Freq: Four times a day (QID) | ORAL | Status: DC | PRN
  Administered 2023-06-25: 4 mg via ORAL
  Filled 2023-06-25: qty 1

## 2023-06-25 MED ORDER — LIDOCAINE 5 % EX PTCH
2.0000 | MEDICATED_PATCH | CUTANEOUS | Status: AC
  Administered 2023-06-25 – 2023-06-26 (×2): 2 via TRANSDERMAL
  Filled 2023-06-25 (×3): qty 2

## 2023-06-25 MED ORDER — EZETIMIBE 10 MG PO TABS
10.0000 mg | ORAL_TABLET | Freq: Every day | ORAL | Status: DC
  Administered 2023-06-26 – 2023-06-29 (×4): 10 mg via ORAL
  Filled 2023-06-25 (×4): qty 1

## 2023-06-25 NOTE — ED Notes (Signed)
NT assisted pt with bedpan at this time.

## 2023-06-25 NOTE — Assessment & Plan Note (Signed)
In setting of advanced spinal stenosis Symptomatic support: Dilaudid 0.5 mg IV every 3 hours as needed for moderate pain, severe pain 20 hours of coverage ordered; fentanyl 25 mcg IV every 2 hours as needed for severe pain refractory to IV Dilaudid, gabapentin 300 mg p.o. 3 times daily, 3 days trial ordered Flexeril 5 mg p.o. 3 times daily, 3 doses ordered Lidocaine 2 patch transdermal administer over 12 hours every 24 hours, 3 days ordered AM team to to evaluate patient at bedside for continued IV opioid medication

## 2023-06-25 NOTE — Assessment & Plan Note (Addendum)
Appears chronic, suspect secondary to CKD 4 Bicarb 325 mg BID, 3 days

## 2023-06-25 NOTE — Progress Notes (Signed)
Full consult note to follow  Contacted by ED provider regarding this 77 yo female with PMH of HTN, Hypothyroid, HL with acute back and right leg/thigh pain.  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  Neuro intact but with significant pain.  As acute in nature, without neurological deficit, and without attempted conservative management, no acute surgical intervention indicated at this time.  Recommend trial of conservative management consisting of physical therapy and pain control.

## 2023-06-25 NOTE — Assessment & Plan Note (Signed)
Patient's hemoglobin range is 8.4-8.8

## 2023-06-25 NOTE — ED Provider Notes (Signed)
[facility] Provider Note    Event Date/Time   First MD Initiated Contact with Patient 06/25/23 0703     (approximate)   History   Hip Pain and Hypertension (250s systolic)   HPI  Betty Nielsen is a 77 y.o. female with past medical history of hypertension, AAA status postrepair, CKD, here with severe back and right flank/leg pain.  The patient states that over the last 2 days, she has had severe, unrelenting, 10 out of 10, sharp, stabbing pain that starts in her back and radiates down to her right flank and right lateral leg.  She states it is worse with any kind of movement.  Denies any preceding trauma.  No numbness or weakness.  She has been unable to walk due to this pain.  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  She is markedly hypertensive.  She was seen here in June for similar symptoms which improved.     Physical Exam   Triage Vital Signs: ED Triage Vitals  Encounter Vitals Group     BP 06/25/23 0650 (!) 253/113     Systolic BP Percentile --      Diastolic BP Percentile --      Pulse Rate 06/25/23 0650 69     Resp 06/25/23 0650 (!) 23     Temp 06/25/23 0650 97.9 F (36.6 C)     Temp Source 06/25/23 0650 Oral     SpO2 06/25/23 0644 100 %     Weight 06/25/23 0651 160 lb 6.4 oz (72.8 kg)     Height --      Head Circumference --      Peak Flow --      Pain Score 06/25/23 0650 10     Pain Loc --      Pain Education --      Exclude from Growth Chart --     Most recent vital signs: Vitals:   06/25/23 1930 06/25/23 1938  BP:  (!) 224/89  Pulse:    Resp:    Temp: 98.9 F (37.2 C)   SpO2:       General: Awake, appears uncomfortable, in pain. CV:  Good peripheral perfusion.  Regular rate and rhythm. Resp:  Normal work of breathing.  Lungs clear to auscultation bilaterally. Abd:  No distention.  No tenderness to palpation.  No guarding or rebound. Other:  Exquisite tenderness to palpation over the  midline and right paraspinal lumbosacral spine.  There is also significant tenderness in the right inguinal area, right hip, and down the right lateral thigh.  1+ DP and PT pulses bilaterally.  Normal sensation light touch.   ED Results / Procedures / Treatments   Labs (all labs ordered are listed, but only abnormal results are displayed) Labs Reviewed  CBC WITH DIFFERENTIAL/PLATELET - Abnormal; Notable for the following components:      Result Value   RBC 2.62 (*)    Hemoglobin 8.1 (*)    HCT 24.7 (*)    Eosinophils Absolute 0.8 (*)    All other components within normal limits  COMPREHENSIVE METABOLIC PANEL - Abnormal; Notable for the following components:   CO2 17 (*)    BUN 50 (*)    Creatinine, Ser 3.58 (*)    GFR, Estimated 13 (*)    All other components within normal limits  URINALYSIS, ROUTINE W REFLEX MICROSCOPIC - Abnormal; Notable for the following components:   Color, Urine  STRAW (*)    APPearance CLEAR (*)    Hgb urine dipstick MODERATE (*)    Protein, ur 100 (*)    All other components within normal limits  LACTIC ACID, PLASMA  BASIC METABOLIC PANEL  CBC     EKG Normal sinus rhythm, ventricular rate 63.  PR 198, QRS 99, QTc 421.  No acute ST elevations repress for any acute evidence of acute ischemia or infarct.   RADIOLOGY MR abdomen:No acute intra abd pathology MR lumbar spine:severe L2-L3 disc extrusion impinging L3 nerve rote, advance and generalized spine degeneration   I also independently reviewed and agree with radiologist interpretations.   PROCEDURES:  Critical Care performed: No   MEDICATIONS ORDERED IN ED: Medications  atorvastatin (LIPITOR) tablet 40 mg (40 mg Oral Given 06/25/23 1931)  cloNIDine (CATAPRES) tablet 0.2 mg (0 mg Oral Hold 06/25/23 1500)  ezetimibe (ZETIA) tablet 10 mg (has no administration in time range)  labetalol (NORMODYNE) tablet 200 mg (200 mg Oral Given 06/25/23 1545)  levothyroxine (SYNTHROID) tablet 75 mcg (has no  administration in time range)  cholecalciferol (VITAMIN D3) 25 MCG (1000 UNIT) tablet 5,000 Units (has no administration in time range)  acetaminophen (TYLENOL) tablet 650 mg (has no administration in time range)    Or  acetaminophen (TYLENOL) suppository 650 mg (has no administration in time range)  ondansetron (ZOFRAN) tablet 4 mg (has no administration in time range)    Or  ondansetron (ZOFRAN) injection 4 mg (has no administration in time range)  heparin injection 5,000 Units (has no administration in time range)  senna-docusate (Senokot-S) tablet 1 tablet (has no administration in time range)  HYDROmorphone (DILAUDID) injection 0.5 mg (has no administration in time range)  fentaNYL (SUBLIMAZE) injection 25 mcg (has no administration in time range)  hydrALAZINE (APRESOLINE) injection 5 mg (5 mg Intravenous Given 06/25/23 1938)  lidocaine (LIDODERM) 5 % 2 patch (2 patches Transdermal Patch Applied 06/25/23 1545)  gabapentin (NEURONTIN) capsule 300 mg (300 mg Oral Given 06/25/23 1545)  cyclobenzaprine (FLEXERIL) tablet 5 mg (5 mg Oral Given 06/25/23 1545)  sodium bicarbonate tablet 325 mg (has no administration in time range)  HYDROmorphone (DILAUDID) injection 0.5 mg (0.5 mg Intravenous Given 06/25/23 0808)  ondansetron (ZOFRAN) injection 4 mg (4 mg Intravenous Given 06/25/23 0808)  HYDROmorphone (DILAUDID) injection 0.5 mg (0.5 mg Intravenous Given 06/25/23 1145)  cloNIDine (CATAPRES) tablet 0.2 mg (0.2 mg Oral Given 06/25/23 1320)  hydrALAZINE (APRESOLINE) injection 10 mg (10 mg Intravenous Given 06/25/23 1546)     IMPRESSION / MDM / ASSESSMENT AND PLAN / ED COURSE  I reviewed the triage vital signs and the nursing notes.                              Differential diagnosis includes, but is not limited to, lumbar radiculopathy, AAA or aortic dissection, occult hip injury, appendicitis, obstruction, cholecystitis  Patient's presentation is most consistent with acute presentation with  potential threat to life or bodily function.  The patient is on the cardiac monitor to evaluate for evidence of arrhythmia and/or significant heart rate changes  77 yo F here with severe R leg pain limiting mobility. Initial DDx is broad, including AAA, aortic dissection given profound hypertension, pain, and known h/o AAA with grafting. Pt unable to receive IV contrast 2/2 CKD. MRA subsequently obtained, as well as MR Lumbar spine. No signs of dissection or acute aortic disease. Imaging does show severe L2-L3 disc  disease which is likely causing her sx. She continues to have severe pain limiting mobility. Discussed with NSGY. Will admit, can eat/drink today but NPO at MN in event of surgical intervention. Cr is at baseline. CBC shows baseline anemia.    FINAL CLINICAL IMPRESSION(S) / ED DIAGNOSES   Final diagnoses:  Lumbar radiculopathy     Rx / DC Orders   ED Discharge Orders     None        Note:  This document was prepared using Dragon voice recognition software and may include unintentional dictation errors.   Shaune Pollack, MD 06/25/23 [employer] [employer]s

## 2023-06-25 NOTE — ED Notes (Signed)
Pt BP still very high here. Pt denies missing any of her home bp meds.

## 2023-06-25 NOTE — Plan of Care (Signed)

## 2023-06-25 NOTE — H&P (Addendum)
History and Physical   Betty Nielsen:[id number] DOB: [birth year] DOA: 06/25/2023  PCP: Alba Cory, MD Outpatient Specialists: Dr. Wynelle Link, nephrology Patient coming from: Home via EMS  I have personally briefly reviewed patient's old medical records in [facility] EMR.  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  Patient reports the pain has been ongoing for the last 2 days, severe, unrelenting, 10 out of 10.  Vitals in the ED showed temperature of 97.9, respiration rate 18, heart rate 60, blood pressure 246/101, currently 210/91, SpO2 100% on room air.  Serum sodium 135, potassium 4.3, chloride 109, bicarb 19, BUN 50, serum creatinine of 3.58, EGFR 13, nonfasting blood glucose 99, WBC 7.2, hemoglobin 8.1, platelets of 205.  Lactic acid is 0.9.  ED treatment: Clonidine 0.2 mg p.o. one-time dose, Dilaudid 0.5 mg IV 2 doses were given first dose was at 8:08 AM, second dose at 11:45 AM, ondansetron 4 mg IV one-time dose.  EDP discussed case with neurosurgeon, neurosurgery recommends keep patient n.p.o. after midnight for possible intervention. ------------------------------------ At bedside, patient was able to tell me her name, her age, location, current calendar year.  Her son Betty Nielsen was at bedside.  She reports she developed right hip pain that radiates down her leg up to her knee and low back pain 2 days ago.  She denies trauma to her person.  She denies chest pain, shortness of breath, dysuria, hematuria, diarrhea.  She endorses swelling at her right knee.  I looked at her right knee and it is equal to her left knee.  She denies missing her home antihypertensive medications.  Social history:  She lives at home.  ROS: Constitutional: no weight change, no fever ENT/Mouth: no sore throat, no rhinorrhea Eyes: no eye pain, no vision changes Cardiovascular: no chest pain, no dyspnea,  no edema, no palpitations Respiratory: no cough, no sputum, no wheezing Gastrointestinal: no nausea, no vomiting, no diarrhea, no constipation Genitourinary: no urinary incontinence, no dysuria, no hematuria Musculoskeletal: no arthralgias, no myalgias, + back pain Skin: no skin lesions, no pruritus, Neuro: + weakness, no loss of consciousness, no syncope Psych: no anxiety, no depression, no decrease appetite Heme/Lymph: no bruising, no bleeding  ED Course: Discussed with emergency medicine provider, patient requiring hospitalization for chief concerns of pain control and hypertensive urgency.  Assessment/Plan  Principal Problem:   Hypertensive urgency Active Problems:   AAA (abdominal aortic aneurysm) without rupture (HCC)   Acid reflux   Hypothyroidism, postsurgical   Major depression in remission (HCC)   Neuropathy   Hyperlipidemia   History of coronary angioplasty   History of stroke   Anemia of chronic disease   Secondary hyperparathyroidism of renal origin (HCC)   Accelerated hypertension   Metabolic acidosis   Inadequate pain control   Assessment and Plan:  * Hypertensive urgency Currently not a candidate for IV steroids given elevated blood pressure Resumed home clonidine 0.2 mg p.o. twice daily Labetalol 200 mg p.o. twice daily One-time dose of hydralazine 10 mg IV once Hydralazine 5 mg IV every 4 hours as needed for SBP greater 165, 4 days ordered Patient may need additional blood pressure medication to be prescribed pending clinical course PCU, inpatient  Inadequate pain control In setting of advanced spinal  stenosis Symptomatic support: Dilaudid 0.5 mg IV every 3 hours as needed for moderate pain, severe pain 20 hours of coverage ordered; fentanyl 25 mcg IV every 2  hours as needed for severe pain refractory to IV Dilaudid, gabapentin 300 mg p.o. 3 times daily, 3 days trial ordered Flexeril 5 mg p.o. 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 after midnight with a low likelihood of neurosurgical intervention. Discussed case with neurosurgery, usually these cases are self-limiting within 1 day of hospitalization and conservative pain management.  Very rarely will they have a case that required surgery, just in case patient continues to experience pain and weakness, neurosurgery recommends to place patient n.p.o. after midnight.    Chart reviewed.   DVT prophylaxis: Heparin 5000 units subcutaneous, one-time dose ordered on admission.  AM team to resume when the benefits outweigh the risks Code Status: Full code Diet: Heart healthy; n.p.o. after midnight Family Communication: Son, Betty Nielsen was at bedside Disposition Plan: Pending clinical course Consults called: Neurosurgery Admission status: PCU, inpatient  Past Medical History:  Diagnosis Date   Arthrosis of knee    Chronic kidney disease    Hyperlipidemia    Hypertension    Loss of hearing    Metabolic syndrome    Plantar fasciitis    Renal insufficiency    Past Surgical History:  Procedure Laterality Date   ABDOMINAL AORTIC ANEURYSM REPAIR     ABDOMINAL HYSTERECTOMY     APPENDECTOMY     CORONARY STENT INTERVENTION N/A 10/26/2016   Procedure: Coronary Stent Intervention;  Surgeon: Alwyn Pea, MD;  Location: [facility]  INVASIVE CV LAB;  Service: Cardiovascular;  Laterality: N/A;   HERNIA REPAIR     INNER EAR SURGERY     pt not sure of type   LEFT HEART CATH AND CORONARY ANGIOGRAPHY N/A 10/26/2016   Procedure: Left Heart Cath and Coronary Angiography;  Surgeon: Dalia Heading, MD;  Location: [facility] INVASIVE CV LAB;  Service: Cardiovascular;  Laterality: N/A;   RENAL ANGIOGRAPHY N/A 06/11/2021   Procedure: RENAL ANGIOGRAPHY;  Surgeon: Annice Needy, MD;  Location: [facility] INVASIVE CV LAB;  Service: Cardiovascular;  Laterality: N/A;   THYROIDECTOMY     Social History:  reports that she quit smoking about 24 years ago. Her smoking use included cigarettes. She started smoking about 44 years ago. She has a 10 pack-year smoking history. She has never used smokeless tobacco. She reports that she does not drink alcohol and does not use drugs.  Allergies  Allergen Reactions   Ace Inhibitors Cough   Ciprofloxacin Other (See Comments)    unknown   Citalopram     hyponatremia   Hydrocodone-Acetaminophen Other (See Comments)    unknown   Norvasc [Amlodipine Besylate]     Pruritus    Pantoprazole Sodium Other (See Comments)    Cramps   Pravastatin Other (See Comments)    Stomach cramps   Sulfa Antibiotics     unknown   Clonidine Other (See Comments)    Per patient told by allergist that would be best  to stop but nephrologist recommended her to stay on medication    Family History  Problem Relation Age of Onset   Healthy Mother    Cerebral aneurysm Father    Family history: Family history reviewed and not pertinent.  Prior to Admission medications   Medication Sig Start Date End Date Taking? Authorizing Provider  acetaminophen (TYLENOL) 650 MG CR tablet Take 650 mg by mouth every 8 (eight) hours as needed for pain.    [provider]  aspirin EC 81 MG tablet Take 81 mg by mouth daily. Swallow whole.    [provider]  atorvastatin (LIPITOR) 40 MG tablet TAKE 1 TABLET BY MOUTH ONCE DAILY AT   6PM.  CHOLESTEROL  MEDICATION. 12/01/22   Alba Cory, MD  cetirizine (ZYRTEC) 10 MG tablet Take 1 tablet (10 mg total) by mouth daily as needed. PRN only 10/28/19   Darlin Priestly, MD  Cholecalciferol 125 MCG (5000 UT) TABS Take 5,000 Units by mouth daily.    [provider]  cloNIDine (CATAPRES) 0.2 MG tablet Take 1 tablet (0.2 mg total) by mouth 2 (two) times daily. 01/05/23   Alba Cory, MD  clopidogrel (PLAVIX) 75 MG tablet TAKE 1 TABLET BY MOUTH ONCE DAILY .  BLOOD  THINNER  TO  HELP  REDUCE  STROKE  RISK 04/15/23   Alba Cory, MD  ezetimibe (ZETIA) 10 MG tablet Take 1 tablet (10 mg total) by mouth daily. 01/05/23   Alba Cory, MD  labetalol (NORMODYNE) 200 MG tablet Take 1 tablet by mouth twice daily 04/15/23   Alba Cory, MD  levothyroxine (SYNTHROID) 75 MCG tablet Take 1 tablet (75 mcg total) by mouth daily before breakfast. 01/05/23   Alba Cory, MD   Physical Exam: Vitals:   06/25/23 1400 06/25/23 1424 06/25/23 1500 06/25/23 1545  BP: (!) 221/90 (!) 198/86 (!) 214/88 (!) 199/93  Pulse: 66 73 68   Resp: 17 14 15    Temp:      TempSrc:      SpO2: 100% 100% 98%   Weight:       Constitutional: appears age-appropriate, frail, NAD, calm Eyes: PERRL, lids and conjunctivae normal ENMT: Mucous membranes are moist. Posterior pharynx clear of any exudate or lesions. Age-appropriate dentition. Hearing appropriate Neck: normal, supple, no masses, no thyromegaly Respiratory: clear to auscultation bilaterally, no wheezing, no crackles. Normal respiratory effort. No accessory muscle use.  Cardiovascular: Regular rate and rhythm, no murmurs / rubs / gallops. No extremity edema. 2+ pedal pulses. No carotid bruits.  Abdomen: Obese abdomen, no tenderness, no masses palpated, no hepatosplenomegaly. Bowel sounds positive.  Musculoskeletal: no clubbing / cyanosis. No joint deformity upper and lower extremities. Good ROM, no contractures, no atrophy. Normal muscle tone.  Skin:  no rashes, lesions, ulcers. No induration Neurologic: Sensation intact. Strength in right lower extremity is minimally decreased compared to the left lower extremity.  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 Psychiatric: Normal judgment and insight. Alert and oriented x 3.  Depressed mood.   EKG: independently reviewed, showing sinus rhythm with rate of 63, QTc 421  Chest x-ray on Admission: Not indicated at this time  MR ANGIO ABDOMEN WO CONTRAST  Result Date: 06/25/2023 CLINICAL DATA:  Severe abdominal and back pain radiating into leg, history of aortic aneurysm post repair, hypertension EXAM: MRA ABDOMEN AND PELVIS WITH CONTRAST TECHNIQUE: Multiplanar, multiecho pulse sequences of the abdomen and pelvis were obtained with intravenous contrast. Angiographic images of abdomen and pelvis were obtained using MRA technique with intravenous  contrast. CONTRAST:  None COMPARISON:  CT 02/13/2023 FINDINGS: ARTERIAL Aorta: Fusiform suprarenal dilatation up to 3.8 cm. Stable changes of infrarenal tube graft repair. Celiac axis:          Patent Superior mesenteric: Patent proximally, limited distal visualization Left renal:           Limited evaluation Right renal:          Limited evaluation Inferior mesenteric:  Not visualized Left iliac:           1.5 cm diameter Right iliac:          2 cm diameter VENOUS Limited evaluation, no acute findings. NONVASCULAR Lower chest:  No acute findings. Hepatobiliary: No masses or other significant abnormality. Pancreas: No mass, inflammatory changes, or other significant abnormality. Spleen: Within normal limits in size and appearance. Adrenals/Urinary Tract: No adrenal mass. Multiple cortical lesions in both kidneys, many of which can be characterized as cysts, largest 6.3 cm left lower pole; no followup recommended. Stomach/Bowel: No evidence of obstruction, inflammatory process, or abnormal fluid collections. Lymphatic: No pathologically enlarged lymph nodes. Reproductive: No mass  or other significant abnormality. Other: No retroperitoneal hematoma.  No ascites. Musculoskeletal: No suspicious bone lesions identified. IMPRESSION: 1. Stable changes of infrarenal aortic repair. 2. Stable suprarenal dilatation up to 3.8 cm. 3. No acute findings. Electronically Signed   By: Corlis Leak M.D.   On: 06/25/2023 12:53   MR LUMBAR SPINE WO CONTRAST  Result Date: 06/25/2023 CLINICAL DATA:  Low back pain with symptoms persisting over 6 weeks of treatment. EXAM: MRI LUMBAR SPINE WITHOUT CONTRAST TECHNIQUE: Multiplanar, multisequence MR imaging of the lumbar spine was performed. No intravenous contrast was administered. COMPARISON:  02/23/2023 abdominal CT FINDINGS: Segmentation:  Standard. Alignment:  Mild scoliosis and L3-4, L4-5 anterolisthesis. Vertebrae: No fracture, evidence of discitis, or bone lesion. Minimal degenerative edema at the L3 superior endplate. Conus medullaris and cauda equina: Conus extends to the L1 level. Conus and cauda equina appear normal. Paraspinal and other soft tissues: Aneurysmal aorta affecting infra and suprarenal segments and measuring up to 4.26 cm at the hiatus, there is contemporaneous MRA of the abdomen. Polycystic kidneys. Disc levels: L1-L2: Small right paracentral protrusion. Moderate right subarticular recess narrowing. L2-L3: Bulging disc with right paracentral extrusion that is downward migrating and markedly impinges on the descending right L3 nerve root. Herniation nearly reaches the L3-4 disc space. Mild to moderate spinal canal narrowing. Patent foramina L3-L4: Disc narrowing and bulging with right foraminal protrusion. Degenerative facet spurring and ligamentum flavum thickening. Advanced spinal stenosis. Moderate right foraminal narrowing. L4-L5: Facet degeneration with spurring and anterolisthesis. Circumferential disc bulging. Moderate spinal stenosis with effacement of both subarticular recesses. L5-S1:Disc narrowing with endplate and facet spurring.  The canal and foramina are patent. IMPRESSION: 1. Advanced and generalized lumbar spine degeneration with mild scoliosis and multilevel listhesis. Dominant findings at L2-3 where there is a large right paracentral extrusion that migrates inferiorly and severely impinges on the right L3 nerve root. 2. 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 Up to moderate foraminal narrowing on the right at L3-4. Electronically Signed   By: Tiburcio Pea M.D.   On: 06/25/2023 12:33    Labs on Admission: I have personally reviewed following labs  CBC: Recent Labs  Lab 06/25/23 0658  WBC 7.2  NEUTROABS 4.4  HGB 8.1*  HCT 24.7*  MCV 94.3  PLT 205   Basic Metabolic Panel: Recent Labs  Lab 06/25/23 0658  NA 135  K  4.3  CL 109  CO2 17*  GLUCOSE 99  BUN 50*  CREATININE 3.58*  CALCIUM 8.9   GFR: Estimated Creatinine Clearance: 12.9 mL/min (A) (by C-G formula based on SCr of 3.58 mg/dL (H)).  Liver Function Tests: Recent Labs  Lab 06/25/23 0658  AST 23  ALT 12  ALKPHOS 67  BILITOT 0.8  PROT 6.7  ALBUMIN 3.6   Urine analysis:    Component Value Date/Time   COLORURINE STRAW (A) 06/25/2023 0658   APPEARANCEUR CLEAR (A) 06/25/2023 0658   APPEARANCEUR Clear 04/04/2012 1438   LABSPEC 1.009 06/25/2023 0658   LABSPEC 1.004 04/04/2012 1438   PHURINE 5.0 06/25/2023 0658   GLUCOSEU NEGATIVE 06/25/2023 0658   GLUCOSEU Negative 04/04/2012 1438   HGBUR MODERATE (A) 06/25/2023 0658   BILIRUBINUR NEGATIVE 06/25/2023 0658   BILIRUBINUR Negative 04/04/2012 1438   KETONESUR NEGATIVE 06/25/2023 0658   PROTEINUR 100 (A) 06/25/2023 0658   UROBILINOGEN 0.2 04/15/2008 0946   NITRITE NEGATIVE 06/25/2023 0658   LEUKOCYTESUR NEGATIVE 06/25/2023 0658   LEUKOCYTESUR Trace 04/04/2012 1438   This document was prepared using Dragon Voice Recognition software and may include unintentional dictation errors.  Dr. Sedalia Muta [facility]  If 7PM-7AM, please contact overnight-coverage provider If  7AM-7PM, please contact day attending provider [email or web address]  06/25/2023, 7:24 PM

## 2023-06-25 NOTE — ED Triage Notes (Addendum)
Per EMS pt woke up and was having R hip pain radiating to her back and down her leg. Pt found to be hypertensive BP 260/121. Pt has no complaints other than pain.

## 2023-06-25 NOTE — Assessment & Plan Note (Addendum)
Currently not a candidate for IV steroids given elevated blood pressure Resumed home clonidine 0.2 mg p.o. twice daily Labetalol 200 mg p.o. twice daily One-time dose of hydralazine 10 mg IV once Hydralazine 5 mg IV every 4 hours as needed for SBP greater 165, 4 days ordered Patient may need additional blood pressure medication to be prescribed pending clinical course PCU, inpatient

## 2023-06-25 NOTE — Assessment & Plan Note (Addendum)
Atorvastatin 40 mg every evening, ezetimibe 10 mg daily resumed

## 2023-06-25 NOTE — Assessment & Plan Note (Signed)
-  Levothyroxine 75 mcg daily before breakfast resumed 

## 2023-06-25 NOTE — Hospital Course (Signed)
Mr. 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  Patient reports the pain has been ongoing for the last 2 days, severe, unrelenting, 10 out of 10.  Vitals in the ED showed temperature of 97.9, respiration rate 18, heart rate 60, blood pressure 246/101, currently 210/91, SpO2 100% on room air.  Serum sodium 135, potassium 4.3, chloride 109, bicarb 19, BUN 50, serum creatinine of 3.58, EGFR 13, nonfasting blood glucose 99, WBC 7.2, hemoglobin 8.1, platelets of 205.  Lactic acid is 0.9.  ED treatment: Clonidine 0.2 mg p.o. one-time dose, Dilaudid 0.5 mg IV 2 doses were given first dose was at 8:08 AM, second dose at 11:45 AM, ondansetron 4 mg IV one-time dose.  EDP discussed case with neurosurgeon, neurosurgery recommends keep patient n.p.o. after midnight for possible intervention.

## 2023-06-26 ENCOUNTER — Observation Stay: Payer: Medicare Other

## 2023-06-26 DIAGNOSIS — I160 Hypertensive urgency: Secondary | ICD-10-CM | POA: Diagnosis not present

## 2023-06-26 DIAGNOSIS — M48061 Spinal stenosis, lumbar region without neurogenic claudication: Secondary | ICD-10-CM

## 2023-06-26 DIAGNOSIS — D638 Anemia in other chronic diseases classified elsewhere: Secondary | ICD-10-CM

## 2023-06-26 DIAGNOSIS — N2581 Secondary hyperparathyroidism of renal origin: Secondary | ICD-10-CM | POA: Diagnosis not present

## 2023-06-26 DIAGNOSIS — R52 Pain, unspecified: Secondary | ICD-10-CM | POA: Diagnosis not present

## 2023-06-26 DIAGNOSIS — E872 Acidosis, unspecified: Secondary | ICD-10-CM

## 2023-06-26 DIAGNOSIS — N184 Chronic kidney disease, stage 4 (severe): Secondary | ICD-10-CM

## 2023-06-26 DIAGNOSIS — I10 Essential (primary) hypertension: Secondary | ICD-10-CM

## 2023-06-26 DIAGNOSIS — G629 Polyneuropathy, unspecified: Secondary | ICD-10-CM

## 2023-06-26 DIAGNOSIS — M4800 Spinal stenosis, site unspecified: Secondary | ICD-10-CM | POA: Diagnosis present

## 2023-06-26 LAB — BASIC METABOLIC PANEL
Anion gap: 11 (ref 5–15)
BUN: 47 mg/dL — ABNORMAL HIGH (ref 8–23)
CO2: 16 mmol/L — ABNORMAL LOW (ref 22–32)
Calcium: 8.9 mg/dL (ref 8.9–10.3)
Chloride: 108 mmol/L (ref 98–111)
Creatinine, Ser: 3.72 mg/dL — ABNORMAL HIGH (ref 0.44–1.00)
GFR, Estimated: 12 mL/min — ABNORMAL LOW (ref >60)
Glucose, Bld: 87 mg/dL (ref 70–99)
Potassium: 5.2 mmol/L — ABNORMAL HIGH (ref 3.5–5.1)
Sodium: 135 mmol/L (ref 135–145)

## 2023-06-26 LAB — CBC
HCT: 25.8 % — ABNORMAL LOW (ref 36.0–46.0)
Hemoglobin: 8.6 g/dL — ABNORMAL LOW (ref 12.0–15.0)
MCH: 30.9 pg (ref 26.0–34.0)
MCHC: 33.3 g/dL (ref 30.0–36.0)
MCV: 92.8 fL (ref 80.0–100.0)
Platelets: 220 10*3/uL (ref 150–400)
RBC: 2.78 MIL/uL — ABNORMAL LOW (ref 3.87–5.11)
RDW: 13.7 % (ref 11.5–15.5)
WBC: 7.1 10*3/uL (ref 4.0–10.5)
nRBC: 0 % (ref 0.0–0.2)

## 2023-06-26 MED ORDER — OXYCODONE HCL 5 MG PO TABS: 5.0000 mg | ORAL_TABLET | ORAL | Status: DC | PRN

## 2023-06-26 MED ORDER — TORSEMIDE 20 MG PO TABS: 20.0000 mg | ORAL_TABLET | Freq: Every day | ORAL | Status: DC

## 2023-06-26 MED ORDER — ACETAMINOPHEN 325 MG PO TABS
650.0000 mg | ORAL_TABLET | Freq: Four times a day (QID) | ORAL | Status: AC
  Administered 2023-06-26 – 2023-06-27 (×4): 650 mg via ORAL
  Filled 2023-06-26 (×4): qty 2

## 2023-06-26 MED ORDER — TORSEMIDE 20 MG PO TABS
20.0000 mg | ORAL_TABLET | Freq: Every day | ORAL | Status: DC
  Administered 2023-06-26 – 2023-06-28 (×3): 20 mg via ORAL
  Filled 2023-06-26 (×3): qty 1

## 2023-06-26 MED ORDER — CYCLOBENZAPRINE HCL 10 MG PO TABS
5.0000 mg | ORAL_TABLET | Freq: Three times a day (TID) | ORAL | Status: AC
  Administered 2023-06-26 – 2023-06-29 (×9): 5 mg via ORAL
  Filled 2023-06-26 (×9): qty 1

## 2023-06-26 NOTE — Progress Notes (Signed)
Progress Note   Patient: Betty Nielsen:[id number] DOB: [birth year] DOA: 06/25/2023     1 DOS: the patient was seen and examined on 06/26/2023   Brief hospital course: Mr. 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  Patient reports the pain has been ongoing for the last 2 days, severe, unrelenting, 10 out of 10.  Vitals in the ED showed temperature of 97.9, respiration rate 18, heart rate 60, blood pressure 246/101, currently 210/91, SpO2 100% on room air.  Serum sodium 135, potassium 4.3, chloride 109, bicarb 19, BUN 50, serum creatinine of 3.58, EGFR 13, nonfasting blood glucose 99, WBC 7.2, hemoglobin 8.1, platelets of 205.  Lactic acid is 0.9.  ED treatment: Clonidine 0.2 mg p.o. one-time dose, Dilaudid 0.5 mg IV 2 doses were given first dose was at 8:08 AM, second dose at 11:45 AM, ondansetron 4 mg IV one-time dose.  EDP discussed case with neurosurgeon, neurosurgery recommends keep patient n.p.o. after midnight for possible intervention.  Neurosurgery provider evaluated the patient recommended to continue pain control, patient opted for conservative management.  Continue BP control.  Patient's admission changed to observation.  Assessment and Plan: * Hypertensive urgency Blood pressure improving. Continue home clonidine 0.2 mg p.o. twice daily, Labetalol 200 mg p.o. twice daily Hydralazine 5 mg IV every 4 hours as needed for SBP greater 165. Continue close monitoring in progressive care unit.  Inadequate pain control Advanced Lumbar spinal stenosis Oral Oxycodone 5mg  PRN for breakthrough pain. Flexeril 5 mg p.o. 3 times daily, 3 days Lidocaine 2 patch transdermal administer over 12 hours every 24 hours, 3 days ordered. Neurosurgery team consult  appreciated. Patient opted for conservative management. Ordered tylenol scheduled, gabapentin. Will consider medrol dose pack if pain not controlled.   Metabolic acidosis CKD stage IV Hyperkalemia Bicarb 325 mg BID, 3 days. Nephrology following closely outpatient.  Consult placed for nephrology.  Anemia of chronic disease Hemoglobin stable 8.4-8.8. No active bleeding.  Continue to monitor CBC.  Hyperlipidemia Atorvastatin 40 mg every evening, ezetimibe 10 mg daily resumed  Hypothyroidism, postsurgical Levothyroxine 75 mcg daily before breakfast resumed     Out of bed to chair. Incentive spirometry. Nursing supportive care. Fall, aspiration precautions. DVT prophylaxis   Code Status: Full Code  Subjective: Patient is seen and examined today morning.  She is sleeping comfortably, pain better.  She did not get out of bed.  Eating fair.  Blood pressure improved.  Physical Exam: Vitals:   06/26/23 0733 06/26/23 0904 06/26/23 0912 06/26/23 1221  BP: (!) 188/92 (!) 205/87 (!) 179/80 (!) 149/95  Pulse: 76 75 75 73  Resp: 16 16 16 16   Temp: 98.3 F (36.8 C) 98.7 F (37.1 C) 98.7 F (37.1 C) 97.9 F (36.6 C)  TempSrc: Oral Oral Oral   SpO2: 99% 98% 98% 99%  Weight:        General - Elderly African-American female, no apparent distress HEENT - PERRLA, EOMI, atraumatic head, non tender sinuses. Lung - Clear, no rales, rhonchi, wheezes. Heart - S1, S2 heard, no murmurs, rubs, trace pedal edema. Abdomen - Soft, non tender nondistended, bowel sounds good Neuro - Alert, awake and oriented x 3, non focal exam. Skin - Warm and dry.  Data Reviewed:      Latest Ref Rng & Units 06/26/2023  4:31 AM 06/25/2023    6:58 AM 02/23/2023    1:40 AM  CBC  WBC 4.0 - 10.5 K/uL 7.1  7.2  6.3   Hemoglobin 12.0 - 15.0 g/dL 8.6  8.1  8.4   Hematocrit 36.0 - 46.0 % 25.8  24.7  27.1   Platelets 150 - 400 K/uL 220  205  198       Latest Ref Rng & Units 06/26/2023    4:31 AM 06/25/2023     6:58 AM 02/23/2023    1:40 AM  BMP  Glucose 70 - 99 mg/dL 87  99  99   BUN 8 - 23 mg/dL 47  50  54   Creatinine 0.44 - 1.00 mg/dL 1.61  0.96  0.45   Sodium 135 - 145 mmol/L 135  135  138   Potassium 3.5 - 5.1 mmol/L 5.2  4.3  4.6   Chloride 98 - 111 mmol/L 108  109  109   CO2 22 - 32 mmol/L 16  17  19    Calcium 8.9 - 10.3 mg/dL 8.9  8.9  9.6    MR ANGIO ABDOMEN WO CONTRAST  Result Date: 06/25/2023 CLINICAL DATA:  Severe abdominal and back pain radiating into leg, history of aortic aneurysm post repair, hypertension EXAM: MRA ABDOMEN AND PELVIS WITH CONTRAST TECHNIQUE: Multiplanar, multiecho pulse sequences of the abdomen and pelvis were obtained with intravenous contrast. Angiographic images of abdomen and pelvis were obtained using MRA technique with intravenous contrast. CONTRAST:  None COMPARISON:  CT 02/13/2023 FINDINGS: ARTERIAL Aorta: Fusiform suprarenal dilatation up to 3.8 cm. Stable changes of infrarenal tube graft repair. Celiac axis:          Patent Superior mesenteric: Patent proximally, limited distal visualization Left renal:           Limited evaluation Right renal:          Limited evaluation Inferior mesenteric:  Not visualized Left iliac:           1.5 cm diameter Right iliac:          2 cm diameter VENOUS Limited evaluation, no acute findings. NONVASCULAR Lower chest:  No acute findings. Hepatobiliary: No masses or other significant abnormality. Pancreas: No mass, inflammatory changes, or other significant abnormality. Spleen: Within normal limits in size and appearance. Adrenals/Urinary Tract: No adrenal mass. Multiple cortical lesions in both kidneys, many of which can be characterized as cysts, largest 6.3 cm left lower pole; no followup recommended. Stomach/Bowel: No evidence of obstruction, inflammatory process, or abnormal fluid collections. Lymphatic: No pathologically enlarged lymph nodes. Reproductive: No mass or other significant abnormality. Other: No retroperitoneal  hematoma.  No ascites. Musculoskeletal: No suspicious bone lesions identified. IMPRESSION: 1. Stable changes of infrarenal aortic repair. 2. Stable suprarenal dilatation up to 3.8 cm. 3. No acute findings. Electronically Signed   By: Corlis Leak M.D.   On: 06/25/2023 12:53   MR LUMBAR SPINE WO CONTRAST  Result Date: 06/25/2023 CLINICAL DATA:  Low back pain with symptoms persisting over 6 weeks of treatment. EXAM: MRI LUMBAR SPINE WITHOUT CONTRAST TECHNIQUE: Multiplanar, multisequence MR imaging of the lumbar spine was performed. No intravenous contrast was administered. COMPARISON:  02/23/2023 abdominal CT FINDINGS: Segmentation:  Standard. Alignment:  Mild scoliosis and L3-4, L4-5 anterolisthesis. Vertebrae: No fracture, evidence of discitis, or bone lesion. Minimal degenerative edema at the L3 superior endplate. Conus medullaris and cauda equina: Conus extends to the L1 level. Conus and cauda equina appear normal. Paraspinal and other soft tissues:  Aneurysmal aorta affecting infra and suprarenal segments and measuring up to 4.26 cm at the hiatus, there is contemporaneous MRA of the abdomen. Polycystic kidneys. Disc levels: L1-L2: Small right paracentral protrusion. Moderate right subarticular recess narrowing. L2-L3: Bulging disc with right paracentral extrusion that is downward migrating and markedly impinges on the descending right L3 nerve root. Herniation nearly reaches the L3-4 disc space. Mild to moderate spinal canal narrowing. Patent foramina L3-L4: Disc narrowing and bulging with right foraminal protrusion. Degenerative facet spurring and ligamentum flavum thickening. Advanced spinal stenosis. Moderate right foraminal narrowing. L4-L5: Facet degeneration with spurring and anterolisthesis. Circumferential disc bulging. Moderate spinal stenosis with effacement of both subarticular recesses. L5-S1:Disc narrowing with endplate and facet spurring. The canal and foramina are patent. IMPRESSION: 1. Advanced  and generalized lumbar spine degeneration with mild scoliosis and multilevel listhesis. Dominant findings at L2-3 where there is a large right paracentral extrusion that migrates inferiorly and severely impinges on the right L3 nerve root. 2. 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 Up to moderate foraminal narrowing on the right at L3-4. Electronically Signed   By: Tiburcio Pea M.D.   On: 06/25/2023 12:33     Family Communication: Discussed with patient, she understands and agrees. All questions answereed.    Disposition: Status is: Observation  for pain control, BP control.   Planned Discharge Destination: Home with Home Health     Time spent: 40 minutes  Author: Marcelino Duster, MD 06/26/2023 3:16 PM Secure chat 7am to 7pm For on call review [email or web address].

## 2023-06-26 NOTE — Progress Notes (Signed)
[facility]  ROUNDING NOTE   Subjective:   Betty Nielsen is a 77 y.o. female with hypertension, renal artery stenosis, coronary artery disease, CVA, peripheral vascular disease and chronic kidney disease stage V. She presents to the ED with right hip and back pain. She has been admitted for Lumbar radiculopathy [M54.16] Hypertensive urgency [I16.0]  Patient is known to our practice and is seen by Dr Wynelle Link. She was last seen in our office on 06/22/23 for routine follow up. Progressive pain over the past 2 days. Denies recent falls. Denies nausea, vomiting or diarrhea.   Labs on  ED arrival concerning for s bicarb 17 and hgb 8.1. Renal function remains at baseline. Potassium slightly elevated today, 5.2.   Objective:  Vital signs in last 24 hours:  Temp:  [97.9 F (36.6 C)-99 F (37.2 C)] 97.9 F (36.6 C) (10/06 1221) Pulse Rate:  [53-80] 73 (10/06 1221) Resp:  [14-20] 16 (10/06 1221) BP: (128-224)/(73-99) 149/95 (10/06 1221) SpO2:  [97 %-100 %] 99 % (10/06 1221)  Weight change:  Filed Weights   06/25/23 0651  Weight: 72.8 kg    Intake/Output: No intake/output data recorded.   Intake/Output this shift:  No intake/output data recorded.  Physical Exam: General: NAD  Head: Normocephalic, atraumatic. Moist oral mucosal membranes  Eyes: Anicteric  Lungs:  Clear to auscultation, normal effort  Heart: Regular rate and rhythm  Abdomen:  Soft, nontender  Extremities:  No peripheral edema.  Neurologic: Alert, moving all four extremities  Skin: No lesions  Access: None    Basic Metabolic Panel: Recent Labs  Lab 06/25/23 0658 06/26/23 0431  NA 135 135  K 4.3 5.2*  CL 109 108  CO2 17* 16*  GLUCOSE 99 87  BUN 50* 47*  CREATININE 3.58* 3.72*  CALCIUM 8.9 8.9    Liver Function Tests: Recent Labs  Lab 06/25/23 0658  AST 23  ALT 12  ALKPHOS 67  BILITOT 0.8  PROT 6.7  ALBUMIN 3.6   No results for input(s): "LIPASE", "AMYLASE" in the last 168  hours. No results for input(s): "AMMONIA" in the last 168 hours.  CBC: Recent Labs  Lab 06/25/23 0658 06/26/23 0431  WBC 7.2 7.1  NEUTROABS 4.4  --   HGB 8.1* 8.6*  HCT 24.7* 25.8*  MCV 94.3 92.8  PLT 205 220    Cardiac Enzymes: No results for input(s): "CKTOTAL", "CKMB", "CKMBINDEX", "TROPONINI" in the last 168 hours.  BNP: Invalid input(s): "POCBNP"  CBG: No results for input(s): "GLUCAP" in the last 168 hours.  Microbiology: Results for orders placed or performed during the hospital encounter of 02/23/23  Urine Culture     Status: None   Collection Time: 02/23/23  1:46 AM   Specimen: Urine, Clean Catch  Result Value Ref Range Status   Specimen Description   Final    URINE, CLEAN CATCH Performed at [facility], [address], [place], [place] [address]    Special Requests   Final    NONE Performed at [facility], [address] [place], [place] [address]    Culture   Final    NO GROWTH Performed at [facility], [address] [place], [place] [address]    Report Status 02/24/2023 FINAL  Final    Coagulation Studies: No results for input(s): "LABPROT", "INR" in the last 72 hours.  Urinalysis: Recent Labs    06/25/23 0658  COLORURINE STRAW*  LABSPEC 1.009  PHURINE 5.0  GLUCOSEU NEGATIVE  HGBUR MODERATE*  BILIRUBINUR NEGATIVE  KETONESUR NEGATIVE  PROTEINUR 100*  NITRITE NEGATIVE  LEUKOCYTESUR NEGATIVE      Imaging: MR ANGIO ABDOMEN WO CONTRAST  Result Date: 06/25/2023 CLINICAL DATA:  Severe abdominal and back pain radiating into leg, history of aortic aneurysm post repair, hypertension EXAM: MRA ABDOMEN AND PELVIS WITH CONTRAST TECHNIQUE: Multiplanar, multiecho pulse sequences of the abdomen and pelvis were obtained with intravenous contrast. Angiographic images of abdomen and pelvis were obtained using MRA technique with intravenous contrast. CONTRAST:  None COMPARISON:  CT 02/13/2023 FINDINGS: ARTERIAL Aorta:  Fusiform suprarenal dilatation up to 3.8 cm. Stable changes of infrarenal tube graft repair. Celiac axis:          Patent Superior mesenteric: Patent proximally, limited distal visualization Left renal:           Limited evaluation Right renal:          Limited evaluation Inferior mesenteric:  Not visualized Left iliac:           1.5 cm diameter Right iliac:          2 cm diameter VENOUS Limited evaluation, no acute findings. NONVASCULAR Lower chest:  No acute findings. Hepatobiliary: No masses or other significant abnormality. Pancreas: No mass, inflammatory changes, or other significant abnormality. Spleen: Within normal limits in size and appearance. Adrenals/Urinary Tract: No adrenal mass. Multiple cortical lesions in both kidneys, many of which can be characterized as cysts, largest 6.3 cm left lower pole; no followup recommended. Stomach/Bowel: No evidence of obstruction, inflammatory process, or abnormal fluid collections. Lymphatic: No pathologically enlarged lymph nodes. Reproductive: No mass or other significant abnormality. Other: No retroperitoneal hematoma.  No ascites. Musculoskeletal: No suspicious bone lesions identified. IMPRESSION: 1. Stable changes of infrarenal aortic repair. 2. Stable suprarenal dilatation up to 3.8 cm. 3. No acute findings. Electronically Signed   By: Corlis Leak M.D.   On: 06/25/2023 12:53   MR LUMBAR SPINE WO CONTRAST  Result Date: 06/25/2023 CLINICAL DATA:  Low back pain with symptoms persisting over 6 weeks of treatment. EXAM: MRI LUMBAR SPINE WITHOUT CONTRAST TECHNIQUE: Multiplanar, multisequence MR imaging of the lumbar spine was performed. No intravenous contrast was administered. COMPARISON:  02/23/2023 abdominal CT FINDINGS: Segmentation:  Standard. Alignment:  Mild scoliosis and L3-4, L4-5 anterolisthesis. Vertebrae: No fracture, evidence of discitis, or bone lesion. Minimal degenerative edema at the L3 superior endplate. Conus medullaris and cauda equina: Conus  extends to the L1 level. Conus and cauda equina appear normal. Paraspinal and other soft tissues: Aneurysmal aorta affecting infra and suprarenal segments and measuring up to 4.26 cm at the hiatus, there is contemporaneous MRA of the abdomen. Polycystic kidneys. Disc levels: L1-L2: Small right paracentral protrusion. Moderate right subarticular recess narrowing. L2-L3: Bulging disc with right paracentral extrusion that is downward migrating and markedly impinges on the descending right L3 nerve root. Herniation nearly reaches the L3-4 disc space. Mild to moderate spinal canal narrowing. Patent foramina L3-L4: Disc narrowing and bulging with right foraminal protrusion. Degenerative facet spurring and ligamentum flavum thickening. Advanced spinal stenosis. Moderate right foraminal narrowing. L4-L5: Facet degeneration with spurring and anterolisthesis. Circumferential disc bulging. Moderate spinal stenosis with effacement of both subarticular recesses. L5-S1:Disc narrowing with endplate and facet spurring. The canal and foramina are patent. IMPRESSION: 1. Advanced and generalized lumbar spine degeneration with mild scoliosis and multilevel listhesis. Dominant findings at L2-3 where there is a large right paracentral extrusion that migrates inferiorly and severely impinges on the right L3 nerve root. 2. Spinal stenosis  at L2-3 to L4-5, advanced at L3-4. Up to moderate foraminal narrowing on the right at L3-4. Electronically Signed   By: Tiburcio Pea M.D.   On: 06/25/2023 12:33     Medications:     atorvastatin  40 mg Oral QPM   cholecalciferol  5,000 Units Oral Daily   cloNIDine  0.2 mg Oral BID   ezetimibe  10 mg Oral Daily   gabapentin  300 mg Oral TID   labetalol  200 mg Oral BID   levothyroxine  75 mcg Oral QAC breakfast   lidocaine  2 patch Transdermal Q24H   sodium bicarbonate  325 mg Oral BID   acetaminophen **OR** acetaminophen, hydrALAZINE, ondansetron **OR** ondansetron (ZOFRAN) IV,  senna-docusate  Assessment/ Plan:  Betty Nielsen is a 77 y.o.  female i with hypertension, renal artery stenosis, coronary artery disease, CVA, peripheral vascular disease and chronic kidney disease stage V. Patient has been admitted for Lumbar radiculopathy [M54.16] Hypertensive urgency [I16.0]    Chronic kidney disease stage V. Baseline creatinine 3.56 with GFR 13 on 02/23/23. No acute indication for dialysis. Will monitor peripherally.   2. Hyperkalemia, potassium 5.2. Will recommend low potassium for now and restart home regimen of Torsemide.   3. Anemia of chronic kidney disease Lab Results  Component Value Date   HGB 8.6 (L) 06/26/2023    Hgb 8.6, below desired range. Will monitor for now and assess need for ESA.   4. Secondary Hyperparathyroidism: with outpatient labs: PTH 262, phosphorus 4.8, calcium 8.8 on 06/15/23.   Lab Results  Component Value Date   CALCIUM 8.9 06/26/2023   CAION 1.10 (L) 04/18/2008   PHOS 3.8 06/10/2021    Prescribed cholecalciferol outpatient. Will continue to monitor bone minerals during this admission.   5. Hypertension with chronic kidney disease. Home regimen includes torsemide, clonidine, and labetalol. Torsemide held, will restart as above.    LOS: 1 Charles Niese 10/6/20241:12 PM

## 2023-06-26 NOTE — Care Management CC44 (Signed)
Condition Code 44 Documentation Completed  Patient Details  Name: [name] MRN: [id number] Date of Birth: March 09, 1946   Condition Code 44 given:  Yes Patient signature on Condition Code 44 notice:  Yes Documentation of 2 MD's agreement:  Yes Code 44 added to claim:  Yes    Bing Quarry, RN 06/26/2023, 4:00 PM

## 2023-06-26 NOTE — Consult Note (Addendum)
NEUROSURGERY CONSULT NOTE   Assessment and Recommendations Betty Nielsen is a 77 y.o. female with acute right thigh radiculopathy with MRI imaging findings of a right L2-3 disc herniation.  Neurologically intact.  Discussed the risks, indications, and alternatives to surgical intervention for the patient, specifically a right L2-3 MIS hemilaminectomy/discectomy with risks including infection, bleeding, repeat surgery, iatrogenic instability, CSF leak, neurological deficit (those risks totaling <5%).  Though the patient is neurologically intact, I discussed with her that surgical intervention is not unreasonable during this hospitalization if her pain is uncontrollable.  At this time she requests to continue with conservative management in efforts to defer surgery.  -Ok to advance diet -recommend scheduled tylenol for 24-48 hours and gabapentin 300 mg TID -Flexeril and po narcotics prn -Consider medrol dosepak for persistent radiculopathy -PT/OT with activity as tolerated -if pain manageable on po/outpatient regiment, will coordinate outpatient follow up with neurosurgery -if pain uncontrolled with above conservative measures, will repeat discussion with patient about surgical intervention  I personally spent 45 minutes face-to-face and non-face-to-face in the care of this patient, which includes all pre, intra, and post visit time on the date of service and discussions with consulting provider and admitting attending.  All documented time was specific to E/M visit and does not include any procedures that may have been performed  Encounter diagnoses Lumbar radiculopathy  History of present illness Betty Nielsen is a 77 y.o. female with PMH of HTN, hypothyroid, aortic dilation s/p graft repair whom we are seeing today in neurosurgical clinic at the request of ED for acute worsening of right hip and right leg radiculopathy.  Patients reports almost 3 months ago developed acute onset leg  radiculopathy and hip pain that mostly resolved with conservative therapy.  Reported still with persistent hip pain attributed to "arthritis."  Then roughly 3 days ago patient with atraumatic acute worsening of back/hip pain rated as "11/10" with radiation into the right thigh.  Reports weakness and difficulty in walking due to the pain.  Denies BB incontinence.  Due to persistence in pain, patients family brought her to the ED yesterday for evaluation.  An MRI was obtained and a right L2-3 disc herniation was noted.  Patient was admitted for pain control.  Started on flexeril, gabapentin and tylenol/IV dilaudid prn. Patient reports currently "8/10" pain after being awoken from sleep  Physical Exam  Vital Signs:  BP (!) 179/80   Pulse 75   Temp 98.7 F (37.1 C) (Oral)   Resp 16   Wt 72.8 kg   SpO2 98%   BMI 27.53 kg/m  General: No acute distress; Body mass index is 27.53 kg/m.  Neurological Exam: Oriented to time, place, and person. Good recent and remote memory. Normal attention span and concentration. Normal language.  Cns grossly intact except for decreased hearing  Sensation: Intact to touch in the upper and lower extremities. Hypesthesias to the right thigh   Musculoskeletal: Gait normal. Station stable.  Muscle Strength   Right Left  Psoas (L2,3) 5/5 5/5  Quadriceps (L3,4) 5/5 5/5  Foot dorsiflex (L4) 5/5 5/5  EHL (L5) 5/5 5/5  Foot plantarflex (S1) 5/5 5/5   Deep Tendon Reflexes   Right Left  Bicep 1/4 1/4  Tricep 1/4 1/4  Brachiorad 1/4 1/4  Patellar 1/4 1/4  Ankle 1/4 1/4    Neurological imaging reviewed MRI lumbar spine: right L2-3 disc herniation with caudal migration.  Compression of the traversing right L3 nerve root  ---Historical data---  Allergies  Ace inhibitors, Ciprofloxacin, Citalopram, Hydrocodone-acetaminophen, Norvasc [amlodipine besylate], Pantoprazole sodium, Pravastatin, Sulfa antibiotics, and Clonidine  Medications   Medications reviewed  in Epic.  Past Medical History Past Medical History:  Diagnosis Date   Arthrosis of knee    Chronic kidney disease    Hyperlipidemia    Hypertension    Loss of hearing    Metabolic syndrome    Plantar fasciitis    Renal insufficiency     Past Surgical History Past Surgical History:  Procedure Laterality Date   ABDOMINAL AORTIC ANEURYSM REPAIR     ABDOMINAL HYSTERECTOMY     APPENDECTOMY     CORONARY STENT INTERVENTION N/A 10/26/2016   Procedure: Coronary Stent Intervention;  Surgeon: Alwyn Pea, MD;  Location: [facility] INVASIVE CV LAB;  Service: Cardiovascular;  Laterality: N/A;   HERNIA REPAIR     INNER EAR SURGERY     pt not sure of type   LEFT HEART CATH AND CORONARY ANGIOGRAPHY N/A 10/26/2016   Procedure: Left Heart Cath and Coronary Angiography;  Surgeon: Dalia Heading, MD;  Location: [facility] INVASIVE CV LAB;  Service: Cardiovascular;  Laterality: N/A;   RENAL ANGIOGRAPHY N/A 06/11/2021   Procedure: RENAL ANGIOGRAPHY;  Surgeon: Annice Needy, MD;  Location: [facility] INVASIVE CV LAB;  Service: Cardiovascular;  Laterality: N/A;   THYROIDECTOMY      Family History Family History  Problem Relation Age of Onset   Healthy Mother    Cerebral aneurysm Father     Social History Social History   Tobacco Use   Smoking status: Former    Current packs/day: 0.00    Average packs/day: 0.5 packs/day for 20.0 years (10.0 ttl pk-yrs)    Types: Cigarettes    Start date: 20    Quit date: 2000    Years since quitting: 24.7   Smokeless tobacco: Never   Tobacco comments:    smoking cessation materials not required  Vaping Use   Vaping status: Never Used  Substance Use Topics   Alcohol use: No    Alcohol/week: 0.0 standard drinks of alcohol   Drug use: No

## 2023-06-27 ENCOUNTER — Observation Stay: Payer: Medicare Other

## 2023-06-27 DIAGNOSIS — I160 Hypertensive urgency: Secondary | ICD-10-CM | POA: Diagnosis not present

## 2023-06-27 DIAGNOSIS — G9341 Metabolic encephalopathy: Secondary | ICD-10-CM

## 2023-06-27 DIAGNOSIS — E872 Acidosis, unspecified: Secondary | ICD-10-CM | POA: Diagnosis not present

## 2023-06-27 DIAGNOSIS — N2581 Secondary hyperparathyroidism of renal origin: Secondary | ICD-10-CM | POA: Diagnosis not present

## 2023-06-27 DIAGNOSIS — R52 Pain, unspecified: Secondary | ICD-10-CM | POA: Diagnosis not present

## 2023-06-27 LAB — RENAL FUNCTION PANEL
Albumin: 3.5 g/dL (ref 3.5–5.0)
Anion gap: 10 (ref 5–15)
BUN: 63 mg/dL — ABNORMAL HIGH (ref 8–23)
CO2: 18 mmol/L — ABNORMAL LOW (ref 22–32)
Calcium: 8.6 mg/dL — ABNORMAL LOW (ref 8.9–10.3)
Chloride: 105 mmol/L (ref 98–111)
Creatinine, Ser: 4.5 mg/dL — ABNORMAL HIGH (ref 0.44–1.00)
GFR, Estimated: 10 mL/min — ABNORMAL LOW (ref >60)
Glucose, Bld: 99 mg/dL (ref 70–99)
Phosphorus: 5.6 mg/dL — ABNORMAL HIGH (ref 2.5–4.6)
Potassium: 5.1 mmol/L (ref 3.5–5.1)
Sodium: 133 mmol/L — ABNORMAL LOW (ref 135–145)

## 2023-06-27 LAB — GLUCOSE, CAPILLARY: Glucose-Capillary: 105 mg/dL — ABNORMAL HIGH (ref 70–99)

## 2023-06-27 MED ORDER — SODIUM BICARBONATE 650 MG PO TABS
325.0000 mg | ORAL_TABLET | Freq: Two times a day (BID) | ORAL | Status: AC
  Administered 2023-06-27 – 2023-06-28 (×3): 325 mg via ORAL
  Filled 2023-06-27 (×3): qty 1

## 2023-06-27 NOTE — Plan of Care (Signed)

## 2023-06-27 NOTE — Evaluation (Signed)
Physical Therapy Evaluation Patient Details Name: Betty Nielsen MRN: [id number] DOB: 07-24-1946 Today's Date: 06/27/2023  History of Present Illness  Pt is a 77 y.o. female presenting to hospital 06/25/23 with c/o severe back and R flank/leg pain; pt noted to be markedly hypertensive (250's systolic).  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  Neuro intact but with significant pain".  Pt admitted with hypertensive urgency, inadequate pain control in setting of advanced spinal stenosis, metabolic acidosis.  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  Clinical Impression  Prior to recent medical concerns, pt reports being independent with functional mobility (occasional use of cane in community); lives alone; no recent falls reported.  0/10 pain reported at rest and minimal LBP/pain in general reported during sessions activities.  Currently pt is SBA semi-supine to sitting EOB; CGA with transfers; and CGA with ambulating 170 feet with RW use.  Pt would currently benefit from skilled PT to address noted impairments and functional limitations (see below for any additional details).  Upon hospital discharge, pt would benefit from ongoing therapy.     If plan is discharge home, recommend the following: A little help with walking and/or transfers;A little help with bathing/dressing/bathroom;Assistance with cooking/housework;Assist for transportation;Help with stairs or ramp for entrance   Can travel by private vehicle    Yes    Equipment Recommendations Rolling walker (2 wheels)  Recommendations for Other Services       Functional Status Assessment Patient has had a recent decline in their functional status and demonstrates the ability to make significant improvements in function in a reasonable and predictable amount of time.     Precautions /  Restrictions Precautions Precautions: Fall Restrictions Weight Bearing Restrictions: No      Mobility  Bed Mobility Overal bed mobility: Needs Assistance Bed Mobility: Supine to Sit     Supine to sit: Supervision, HOB elevated, Used rails     General bed mobility comments: mild increased effort to perform on own; via logrolling    Transfers Overall transfer level: Needs assistance Equipment used: Rolling walker (2 wheels) Transfers: Sit to/from Stand, Bed to chair/wheelchair/BSC Sit to Stand: Contact guard assist   Step pivot transfers: Contact guard assist (stand step turn bed to recliner with RW use)       General transfer comment: mild increased effort to stand up to RW; steady; x1 trial from bed and x1 trial from recliner    Ambulation/Gait Ambulation/Gait assistance: Contact guard assist Gait Distance (Feet): 170 Feet Assistive device: Rolling walker (2 wheels) Gait Pattern/deviations: Step-through pattern Gait velocity: decreased     General Gait Details: flexed posture; vc's to keep walker closer towards end of ambulation (with fatigue)  Stairs            Wheelchair Mobility     Tilt Bed    Modified Rankin (Stroke Patients Only)       Balance Overall balance assessment: Needs assistance Sitting-balance support: No upper extremity supported, Feet supported Sitting balance-Leahy Scale: Good Sitting balance - Comments: steady reaching within BOS   Standing balance support: Bilateral upper extremity supported, During functional activity, Reliant on assistive device for balance Standing balance-Leahy Scale: Good Standing balance comment: steady ambulating with RW use                             Pertinent  Vitals/Pain Pain Assessment Pain Assessment: Faces Faces Pain Scale: Hurts a little bit Pain Location: low back Pain Descriptors / Indicators: Sore Pain Intervention(s): Limited activity within patient's tolerance, Monitored  during session, Premedicated before session, Repositioned Vitals (HR and SpO2 on room air) stable and WFL throughout treatment session.    Home Living Family/patient expects to be discharged to:: Private residence Living Arrangements: Alone   Type of Home: House Home Access: Stairs to enter Entrance Stairs-Rails: Right Entrance Stairs-Number of Steps: 2   Home Layout: One level Home Equipment: Grab bars - tub/shower;Cane - single point      Prior Function Prior Level of Function : Independent/Modified Independent             Mobility Comments: Pt reports being independent with functional mobility; no recent falls reported; occasional use of cane in community. ADLs Comments: Independent     Extremity/Trunk Assessment   Upper Extremity Assessment Upper Extremity Assessment: Overall WFL for tasks assessed    Lower Extremity Assessment Lower Extremity Assessment: Generalized weakness    Cervical / Trunk Assessment Cervical / Trunk Assessment: Other exceptions Cervical / Trunk Exceptions: forward head/shoulders  Communication   Communication Communication: Hearing impairment (HOH) Cueing Techniques: Verbal cues;Visual cues  Cognition Arousal: Alert (Pt sleeping upon PT arrival but alert during session.) Behavior During Therapy: Flat affect                                   General Comments: Oriented to person, hospital, month/year.  Pt reported she did not know why she was in hospital but talked about her BP issues during session.  Increased time to follow cues and requiring repeated cues at times (complicated d/t HOH).        General Comments  Nursing cleared pt for participation in physical therapy.  Pt agreeable to PT session.    Exercises     Assessment/Plan    PT Assessment Patient needs continued PT services  PT Problem List Decreased strength;Decreased activity tolerance;Decreased balance;Decreased mobility;Decreased knowledge of use of  DME;Pain       PT Treatment Interventions DME instruction;Gait training;Stair training;Functional mobility training;Therapeutic activities;Therapeutic exercise;Balance training;Patient/family education    PT Goals (Current goals can be found in the Care Plan section)  Acute Rehab PT Goals Patient Stated Goal: to improve mobility PT Goal Formulation: With patient Time For Goal Achievement: 07/11/23 Potential to Achieve Goals: Good    Frequency Min 1X/week     Co-evaluation               AM-PAC PT "6 Clicks" Mobility  Outcome Measure Help needed turning from your back to your side while in a flat bed without using bedrails?: None Help needed moving from lying on your back to sitting on the side of a flat bed without using bedrails?: A Little Help needed moving to and from a bed to a chair (including a wheelchair)?: A Little Help needed standing up from a chair using your arms (e.g., wheelchair or bedside chair)?: A Little Help needed to walk in hospital room?: A Little Help needed climbing 3-5 steps with a railing? : A Little 6 Click Score: 19    End of Session Equipment Utilized During Treatment: Gait belt Activity Tolerance: Patient tolerated treatment well Patient left: in chair;with call bell/phone within reach;with chair alarm set Nurse Communication: Mobility status;Precautions;Other (comment) (Pt's BP during session) PT Visit Diagnosis: Other abnormalities of gait and  mobility (R26.89);Muscle weakness (generalized) (M62.81);Pain Pain - Right/Left: Right Pain - part of body: Hip;Leg (low back)    Time: 1610-9604 PT Time Calculation (min) (ACUTE ONLY): 32 min   Charges:   PT Evaluation $PT Eval Low Complexity: 1 Low PT Treatments $Therapeutic Activity: 8-22 mins PT General Charges $$ ACUTE PT VISIT: 1 Visit        Hendricks Limes, PT 06/27/23, 2:37 PM

## 2023-06-27 NOTE — Progress Notes (Signed)
Progress Note   Patient: Betty Nielsen ZHY:[id number] DOB: 1946-05-23 DOA: 06/25/2023     1 DOS: the patient was seen and examined on 06/27/2023   Brief hospital course: Mr. 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  Patient reports the pain has been ongoing for the last 2 days, severe, unrelenting, 10 out of 10.  Vitals in the ED showed temperature of 97.9, respiration rate 18, heart rate 60, blood pressure 246/101, currently 210/91, SpO2 100% on room air.  Serum sodium 135, potassium 4.3, chloride 109, bicarb 19, BUN 50, serum creatinine of 3.58, EGFR 13, nonfasting blood glucose 99, WBC 7.2, hemoglobin 8.1, platelets of 205.  Lactic acid is 0.9.  ED treatment: Clonidine 0.2 mg p.o. one-time dose, Dilaudid 0.5 mg IV 2 doses were given first dose was at 8:08 AM, second dose at 11:45 AM, ondansetron 4 mg IV one-time dose.  EDP discussed case with neurosurgeon, neurosurgery recommends keep patient n.p.o. after midnight for possible intervention.  Neurosurgery provider evaluated the patient recommended to continue pain control, patient opted for conservative management.  Continue BP control.  Patient's admission changed to observation.  Assessment and Plan: * Hypertensive urgency Blood pressure improved but today it is on lower side. She does have dizziness. Likely pain meds caused drop in pressure. Monitor BP closely. Hold on IV fluids for now. Avoid opiates. Tylenol scheduled for pain. Hold off on antihypertensives until BP improves. Hydralazine 5 mg IV every 4 hours as needed for SBP greater 165. Continue close monitoring in progressive care unit.  Inadequate pain control Advanced Lumbar spinal stenosis She was confused with opiates, avoid  oxycodone. Tylenol scheduled for another day. Flexeril and gabapentin ordered. Lidocaine 2 patch transdermal administer over 12 hours every 24 hours, 3 days ordered. Neurosurgery team consult appreciated. Patient opted for conservative management. Ordered tylenol scheduled, gabapentin.  Acute metabolic encephalopathy- In the setting of renal dysfunction, low BP, opiates. Will stop oxycodone for now. CT head unremarkable. Mental status today improved. Monitor neurochecks. Fall, aspiration precautions.  Metabolic acidosis CKD stage IV Hyperkalemia -improved Bicarb 325 mg BID. Nephrology follow up appreciated. She can follow as outpatient.  Anemia of chronic disease Hemoglobin stable 8.4-8.8. No active bleeding.  Continue to monitor CBC.  Hyperlipidemia Atorvastatin 40 mg every evening, ezetimibe 10 mg daily resumed  Hypothyroidism, postsurgical Levothyroxine 75 mcg daily before breakfast resumed     Out of bed to chair. Incentive spirometry. Nursing supportive care. Fall, aspiration precautions. DVT prophylaxis   Code Status: Full Code  Subjective: Patient is seen and examined today morning.  She is lethargic, has dizziness while getting up. Was confused last night but this AM able to answer me. Opiates stopped. BP lower side per RN.  Physical Exam: Vitals:   06/27/23 1131 06/27/23 1207 06/27/23 1621 06/27/23 2001  BP: (!) 87/46 (!) 99/52 107/65 (!) 148/77  Pulse: 62 63 70 62  Resp:   18 18  Temp:   97.8 F (36.6 C) 97.8 F (36.6 C)  TempSrc:      SpO2:  99% 100% 100%  Weight:        General - Elderly African-American female, no apparent distress HEENT - PERRLA, EOMI, atraumatic head, hard of hearing Lung - Clear, no rales, rhonchi, wheezes. Heart - S1, S2 heard, no murmurs, rubs, trace pedal  edema. Abdomen - Soft, non tender nondistended, bowel sounds good Neuro - Alert, awake and oriented x 3, non focal exam. Skin - Warm and dry.  Data Reviewed:       Latest Ref Rng & Units 06/26/2023    4:31 AM 06/25/2023    6:58 AM 02/23/2023    1:40 AM  CBC  WBC 4.0 - 10.5 K/uL 7.1  7.2  6.3   Hemoglobin 12.0 - 15.0 g/dL 8.6  8.1  8.4   Hematocrit 36.0 - 46.0 % 25.8  24.7  27.1   Platelets 150 - 400 K/uL 220  205  198       Latest Ref Rng & Units 06/27/2023    4:58 AM 06/26/2023    4:31 AM 06/25/2023    6:58 AM  BMP  Glucose 70 - 99 mg/dL 99  87  99   BUN 8 - 23 mg/dL 63  47  50   Creatinine 0.44 - 1.00 mg/dL 1.61  0.96  0.45   Sodium 135 - 145 mmol/L 133  135  135   Potassium 3.5 - 5.1 mmol/L 5.1  5.2  4.3   Chloride 98 - 111 mmol/L 105  108  109   CO2 22 - 32 mmol/L 18  16  17    Calcium 8.9 - 10.3 mg/dL 8.6  8.9  8.9    DG Chest 2 View  Result Date: 06/27/2023 CLINICAL DATA:  Stage 5 chronic kidney disease EXAM: CHEST - 2 VIEW COMPARISON:  Prior chest x-ray 06/09/2021 FINDINGS: Stable cardiomegaly. Aortic atherosclerotic calcifications in the transverse aorta. The lungs are clear. No pulmonary edema, pleural effusion or pneumothorax. Chronic elevation of the left hemidiaphragm is again noted with associated left basilar atelectasis. IMPRESSION: 1. No acute cardiopulmonary process. 2. Stable cardiomegaly. 3. Aortic atherosclerotic vascular calcifications. 4. Chronic elevation of the left hemidiaphragm. Electronically Signed   By: Malachy Moan M.D.   On: 06/27/2023 13:12   CT HEAD WO CONTRAST ( )  Result Date: 06/26/2023 CLINICAL DATA:  Initial evaluation for neuro deficit, stroke suspected. EXAM: CT HEAD WITHOUT CONTRAST TECHNIQUE: Contiguous axial images were obtained from the base of the skull through the vertex without intravenous contrast. RADIATION DOSE REDUCTION: This exam was performed according to the departmental dose-optimization program which includes automated exposure control, adjustment of the mA and/or kV according to patient size and/or use of iterative reconstruction technique. COMPARISON:  Prior study from 06/09/2021. FINDINGS:  Brain: Age-related cerebral atrophy with moderate chronic microvascular ischemic disease. Few scatter remote lacunar infarcts present about the bilateral basal ganglia. No acute intracranial hemorrhage. No acute large vessel territory infarct. No mass lesion, midline shift or mass effect. No hydrocephalus or extra-axial fluid collection. Vascular: No abnormal hyperdense vessel. Scattered vascular calcifications noted within the carotid siphons. Skull: Scalp soft tissues demonstrate no acute finding. Calvarium intact. Sinuses/Orbits: Visualized globes and orbital soft tissues within normal limits. Visualized paranasal sinuses are clear. Prior mastoidectomy on the right. No significant mastoid effusion. Other: None. IMPRESSION: 1. No acute intracranial abnormality. 2. Age-related cerebral atrophy with moderate chronic microvascular ischemic disease. Electronically Signed   By: Rise Mu M.D.   On: 06/26/2023 19:36     Family Communication: Discussed with patient, she understands and agrees. All questions answereed.    Disposition: Status is: Observation  for pain control, BP control.   Planned Discharge Destination: Home with Home Health     Time spent: 42 minutes  Author: Marcelino Duster, MD 06/27/2023 8:40 PM Secure chat 7am  to 7pm For on call review [email or web address].

## 2023-06-27 NOTE — Evaluation (Signed)
Occupational Therapy Evaluation Patient Details Name: Betty Nielsen MRN: [id number] DOB: 1946-05-17 Today's Date: 06/27/2023   History of Present Illness Pt is a 77 y.o. female presenting to hospital 06/25/23 with c/o severe back and R flank/leg pain; pt noted to be markedly hypertensive (250's systolic).  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  Neuro intact but with significant pain".  Pt admitted with hypertensive urgency, inadequate pain control in setting of advanced spinal stenosis, metabolic acidosis.  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   Clinical Impression   Pt was seen for OT evaluation this date. Prior to hospital admission, pt was Independent with all ADL's and IADL's. Pt lives alone at home. Pt reports that her son and his wife can come by and help if needed. Pt presents to acute OT demonstrating impaired ADL performance and functional mobility(See OT problem list for additional functional deficits). Pt completed sit<>stand t/f with supervision utilizing a RW. Pt ambulated to the bathroom and completed toilet t/f, toilet management, and hygiene with supervision. Pt required increased time to follow cues and needed repeated cues at times due to [facility]. Pt ambulated back to chair. While seated in chair pt donn/doff socks with supervision. Pt left in chair with call bell within reach and chair alarm. Pt would benefit from skilled OT services to address noted impairments and functional limitations (see below for any additional details) in order to maximize safety and independence while minimizing falls risk and caregiver burden. Anticipate the need for follow up OT services upon acute hospital DC.       If plan is discharge home, recommend the following: A little help with walking and/or transfers;A little help with  bathing/dressing/bathroom;Assistance with cooking/housework;Help with stairs or ramp for entrance;Assist for transportation    Functional Status Assessment  Patient has had a recent decline in their functional status and demonstrates the ability to make significant improvements in function in a reasonable and predictable amount of time.  Equipment Recommendations  BSC/3in1    Recommendations for Other Services       Precautions / Restrictions Precautions Precautions: Fall Restrictions Weight Bearing Restrictions: No      Mobility Bed Mobility                    Transfers Overall transfer level: Needs assistance Equipment used: Rolling walker (2 wheels) Transfers: Sit to/from Stand Sit to Stand: Supervision                  Balance Overall balance assessment: Needs assistance Sitting-balance support: No upper extremity supported, Feet supported Sitting balance-Leahy Scale: Good     Standing balance support: Bilateral upper extremity supported, During functional activity, Reliant on assistive device for balance Standing balance-Leahy Scale: Good Standing balance comment: steady ambulating with RW use                           ADL either performed or assessed with clinical judgement   ADL Overall ADL's : Needs assistance/impaired     Grooming: Wash/dry hands;Supervision/safety;Standing                   Toilet Transfer: Supervision/safety;Regular Toilet;Grab bars;Rolling walker (2 wheels);Ambulation   Toileting- Clothing Manipulation and Hygiene: Supervision/safety;Sit to/from stand       Functional mobility during ADLs: Supervision/safety  Vision         Perception         Praxis         Pertinent Vitals/Pain Pain Assessment Pain Assessment: Faces Faces Pain Scale: Hurts a little bit Pain Location: low back Pain Descriptors / Indicators: Sore Pain Intervention(s): Monitored during session      Extremity/Trunk Assessment Upper Extremity Assessment Upper Extremity Assessment: Overall WFL for tasks assessed   Lower Extremity Assessment Lower Extremity Assessment: Generalized weakness   Cervical / Trunk Assessment Cervical / Trunk Assessment: Other exceptions Cervical / Trunk Exceptions: forward head/shoulders   Communication Communication Communication: Hearing impairment Cueing Techniques: Verbal cues;Visual cues   Cognition Arousal: Alert Behavior During Therapy: Flat affect Overall Cognitive Status: Within Functional Limits for tasks assessed                                 General Comments: Increased time to fowllow cues and requiring repeated cues at times (complicated d/t [facility])     General Comments       Exercises     Shoulder Instructions      Home Living Family/patient expects to be discharged to:: Private residence Living Arrangements: Alone Available Help at Discharge: Available PRN/intermittently;Family (Pt reports that her son and daughter in-law come by and check on her) Type of Home: House Home Access: Stairs to enter [employer] of Steps: 2 Entrance Stairs-Rails: Right Home Layout: One level     Bathroom Shower/Tub: Chief Strategy Officer: Standard     Home Equipment: Grab bars - tub/shower;Cane - single point          Prior Functioning/Environment Prior Level of Function : Independent/Modified Independent             Mobility Comments: Pt reports being independent with functional mobility; no recent falls reported; occasional use of cane in community. ADLs Comments: Independent in ADL's and IADL's        OT Problem List: Decreased strength;Decreased range of motion;Decreased activity tolerance;Decreased safety awareness;Decreased coordination      OT Treatment/Interventions: Self-care/ADL training;Therapeutic exercise    OT Goals(Current goals can be found in the care plan section)  Acute Rehab OT Goals Patient Stated Goal: to go home OT Goal Formulation: With patient Time For Goal Achievement: 07/11/23 Potential to Achieve Goals: Good  OT Frequency: Min 1X/week    Co-evaluation              AM-PAC OT "6 Clicks" Daily Activity     Outcome Measure Help from another person eating meals?: None Help from another person taking care of personal grooming?: A Little Help from another person toileting, which includes using toliet, bedpan, or urinal?: A Little Help from another person bathing (including washing, rinsing, drying)?: A Little Help from another person to put on and taking off regular upper body clothing?: A Little Help from another person to put on and taking off regular lower body clothing?: A Little 6 Click Score: 19   End of Session Equipment Utilized During Treatment: Rolling walker (2 wheels)  Activity Tolerance: Patient tolerated treatment well Patient left: in chair;with call bell/phone within reach;with chair alarm set  OT Visit Diagnosis: Muscle weakness (generalized) (M62.81);Unsteadiness on feet (R26.81);Other abnormalities of gait and mobility (R26.89)                Time: 4098-1191 OT Time Calculation (min): 19 min Charges:  OT General Charges $OT  Visit: 1 Visit OT Evaluation $OT Eval Low Complexity: 1 Low OT Treatments $Self Care/Home Management : 8-22 mins  Butch Penny, SOT

## 2023-06-27 NOTE — TOC Progression Note (Signed)
Transition of Care [facility]) - Progression Note    Patient Details  Name: Betty Nielsen MRN: [id number] Date of Birth: [birth year]  [facility]) CM/SW Contact  Truddie Hidden, RN Phone Number: 06/27/2023, 2:17 PM  Clinical Narrative:    TOC continuing to follow patient's progress throughout discharge planning.        Expected Discharge Plan and Services                                               Social Determinants of Health (SDOH) Interventions SDOH Screenings   Food Insecurity: No Food Insecurity (06/25/2023)  Housing: Low Risk  (06/25/2023)  Transportation Needs: No Transportation Needs (06/25/2023)  Utilities: Not At Risk (06/25/2023)  Alcohol Screen: Low Risk  (08/25/2022)  Depression (PHQ2-9): Low Risk  (01/05/2023)  Financial Resource Strain: Low Risk  (08/25/2022)  Physical Activity: Inactive (08/25/2022)  Social Connections: Unknown (08/25/2022)  Stress: No Stress Concern Present (08/25/2022)  Tobacco Use: Medium Risk (06/25/2023)    Readmission Risk Interventions     No data to display

## 2023-06-28 DIAGNOSIS — I160 Hypertensive urgency: Secondary | ICD-10-CM | POA: Diagnosis not present

## 2023-06-28 DIAGNOSIS — N2581 Secondary hyperparathyroidism of renal origin: Secondary | ICD-10-CM | POA: Diagnosis not present

## 2023-06-28 DIAGNOSIS — R52 Pain, unspecified: Secondary | ICD-10-CM | POA: Diagnosis not present

## 2023-06-28 DIAGNOSIS — E872 Acidosis, unspecified: Secondary | ICD-10-CM | POA: Diagnosis not present

## 2023-06-28 DIAGNOSIS — E890 Postprocedural hypothyroidism: Secondary | ICD-10-CM

## 2023-06-28 LAB — BASIC METABOLIC PANEL
Anion gap: 7 (ref 5–15)
BUN: 77 mg/dL — ABNORMAL HIGH (ref 8–23)
CO2: 19 mmol/L — ABNORMAL LOW (ref 22–32)
Calcium: 8.3 mg/dL — ABNORMAL LOW (ref 8.9–10.3)
Chloride: 105 mmol/L (ref 98–111)
Creatinine, Ser: 5.25 mg/dL — ABNORMAL HIGH (ref 0.44–1.00)
GFR, Estimated: 8 mL/min — ABNORMAL LOW (ref >60)
Glucose, Bld: 99 mg/dL (ref 70–99)
Potassium: 4.8 mmol/L (ref 3.5–5.1)
Sodium: 131 mmol/L — ABNORMAL LOW (ref 135–145)

## 2023-06-28 LAB — CBC
HCT: 24.6 % — ABNORMAL LOW (ref 36.0–46.0)
Hemoglobin: 7.9 g/dL — ABNORMAL LOW (ref 12.0–15.0)
MCH: 30.2 pg (ref 26.0–34.0)
MCHC: 32.1 g/dL (ref 30.0–36.0)
MCV: 93.9 fL (ref 80.0–100.0)
Platelets: 180 10*3/uL (ref 150–400)
RBC: 2.62 MIL/uL — ABNORMAL LOW (ref 3.87–5.11)
RDW: 13.5 % (ref 11.5–15.5)
WBC: 6.3 10*3/uL (ref 4.0–10.5)
nRBC: 0 % (ref 0.0–0.2)

## 2023-06-28 LAB — GLUCOSE, CAPILLARY: Glucose-Capillary: 82 mg/dL (ref 70–99)

## 2023-06-28 MED ORDER — CLONIDINE HCL 0.1 MG PO TABS
0.1000 mg | ORAL_TABLET | Freq: Two times a day (BID) | ORAL | Status: DC
  Administered 2023-06-28 – 2023-06-29 (×3): 0.1 mg via ORAL
  Filled 2023-06-28 (×3): qty 1

## 2023-06-28 MED ORDER — LABETALOL HCL 100 MG PO TABS
100.0000 mg | ORAL_TABLET | Freq: Two times a day (BID) | ORAL | Status: DC
  Administered 2023-06-28 – 2023-06-29 (×3): 100 mg via ORAL
  Filled 2023-06-28 (×3): qty 1

## 2023-06-28 NOTE — Progress Notes (Signed)
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  Patient reports the pain has been ongoing for the last 2 days, severe, unrelenting, 10 out of 10.  Vitals in the ED showed temperature of 97.9, respiration rate 18, heart rate 60, blood pressure 246/101, currently 210/91, SpO2 100% on room air.  Serum sodium 135, potassium 4.3, chloride 109, bicarb 19, BUN 50, serum creatinine of 3.58, EGFR 13, nonfasting blood glucose 99, WBC 7.2, hemoglobin 8.1, platelets of 205.  Lactic acid is 0.9.  ED treatment: Clonidine 0.2 mg p.o. one-time dose, Dilaudid 0.5 mg IV 2 doses were given first dose was at 8:08 AM, second dose at 11:45 AM, ondansetron 4 mg IV one-time dose.  EDP discussed case with neurosurgeon, neurosurgery recommends keep patient n.p.o. after midnight for possible intervention.  Neurosurgery provider evaluated the patient recommended to continue pain control, patient opted for conservative management.  Continue BP control.  Patient's admission changed to observation.  Pain better controlled with Tylenol, Gabapentin, flexeril PRN. Opiates held due to confusion. CT head unremarkable. Nephrologist followed her for worsening CKD stage 4. She has ambulatory dysfunction, PT/ OT follow up for safe dc plan.  Assessment and Plan: * Hypertensive urgency Blood pressure improved and then dropped to 70/40. She did have dizziness, likely pain meds caused drop in pressure. Monitor BP closely. Avoid opiates. Tylenol scheduled for pain. Antihypertensives dose decreased to half Labetolol 100 bid, Clonidine  0.1 mg bid as BP improved. Continue close monitoring in progressive care unit.  Inadequate pain control Advanced Lumbar spinal stenosis She was confused with opiates, avoid oxycodone. Tylenol scheduled for another day. Flexeril and gabapentin ordered. Lidocaine 2 patch transdermal administer over 12 hours every 24 hours, 3 days ordered. Neurosurgery team consult appreciated. Patient opted for conservative management. Ordered tylenol scheduled, gabapentin.  Acute metabolic encephalopathy- In the setting of renal dysfunction, low BP, opiates. No oxycodone for now. CT head unremarkable. Mental status back to baseline. Monitor neurochecks. Fall, aspiration precautions.  Metabolic acidosis Worsening CKD stage IV Hyperkalemia -improved Bicarb 325 mg BID. She is on Torsemide which will be held, Creatinine bumped to 5.25. Monitor daily renal function. Nephrology follow up appreciated. She can follow as outpatient.  Anemia of chronic disease Hemoglobin stable around 8. No active bleeding.  Continue to monitor CBC.  Hyperlipidemia Atorvastatin 40 mg every evening, ezetimibe 10 mg daily resumed  Hypothyroidism, postsurgical Levothyroxine 75 mcg daily before breakfast resumed     Out of bed to chair. Incentive spirometry. Nursing supportive care. Fall, aspiration precautions. DVT prophylaxis   Code Status: Full Code  Subjective: Patient is seen and examined today morning.  She is sitting in chair,  unsteady while working with OT. BP improved. Back pain improved. Has right hip discomfort.   Physical Exam: Vitals:   06/27/23 2304 06/28/23 0435 06/28/23 0828 06/28/23 1526  BP: (!) 156/82 (!) 144/59 (!) 159/73 (!) 151/75  Pulse: 66 63 71 (!) 58  Resp: 20 18    Temp: 97.9 F (36.6 C) 98 F (36.7 C) 97.7 F (36.5 C) 97.6 F (36.4 C)  TempSrc: Oral Oral  Oral  SpO2: 98% 98%  99% 100%  Weight:  78.7 kg      General - Elderly African-American female, sitting in no apparent  distress HEENT - PERRLA, EOMI, atraumatic head, hard of hearing Lung - Clear, no rales, rhonchi, wheezes. Heart - S1, S2 heard, no murmurs, rubs, trace pedal edema. Abdomen - Soft, non tender nondistended, bowel sounds good Neuro - Alert, awake and oriented x 3, non focal exam. Skin - Warm and dry.  Data Reviewed:      Latest Ref Rng & Units 06/28/2023    4:49 AM 06/26/2023    4:31 AM 06/25/2023    6:58 AM  CBC  WBC 4.0 - 10.5 K/uL 6.3  7.1  7.2   Hemoglobin 12.0 - 15.0 g/dL 7.9  8.6  8.1   Hematocrit 36.0 - 46.0 % 24.6  25.8  24.7   Platelets 150 - 400 K/uL 180  220  205       Latest Ref Rng & Units 06/28/2023    4:49 AM 06/27/2023    4:58 AM 06/26/2023    4:31 AM  BMP  Glucose 70 - 99 mg/dL 99  99  87   BUN 8 - 23 mg/dL 77  63  47   Creatinine 0.44 - 1.00 mg/dL 4.09  8.11  9.14   Sodium 135 - 145 mmol/L 131  133  135   Potassium 3.5 - 5.1 mmol/L 4.8  5.1  5.2   Chloride 98 - 111 mmol/L 105  105  108   CO2 22 - 32 mmol/L 19  18  16    Calcium 8.9 - 10.3 mg/dL 8.3  8.6  8.9    DG Chest 2 View  Result Date: 06/27/2023 CLINICAL DATA:  Stage 5 chronic kidney disease EXAM: CHEST - 2 VIEW COMPARISON:  Prior chest x-ray 06/09/2021 FINDINGS: Stable cardiomegaly. Aortic atherosclerotic calcifications in the transverse aorta. The lungs are clear. No pulmonary edema, pleural effusion or pneumothorax. Chronic elevation of the left hemidiaphragm is again noted with associated left basilar atelectasis. IMPRESSION: 1. No acute cardiopulmonary process. 2. Stable cardiomegaly. 3. Aortic atherosclerotic vascular calcifications. 4. Chronic elevation of the left hemidiaphragm. Electronically Signed   By: Malachy Moan M.D.   On: 06/27/2023 13:12   CT HEAD WO CONTRAST ( )  Result Date: 06/26/2023 CLINICAL DATA:  Initial evaluation for neuro deficit, stroke suspected. EXAM: CT HEAD WITHOUT CONTRAST TECHNIQUE: Contiguous axial images were obtained from the base of the skull through the vertex  without intravenous contrast. RADIATION DOSE REDUCTION: This exam was performed according to the departmental dose-optimization program which includes automated exposure control, adjustment of the mA and/or kV according to patient size and/or use of iterative reconstruction technique. COMPARISON:  Prior study from 06/09/2021. FINDINGS: Brain: Age-related cerebral atrophy with moderate chronic microvascular ischemic disease. Few scatter remote lacunar infarcts present about the bilateral basal ganglia. No acute intracranial hemorrhage. No acute large vessel territory infarct. No mass lesion, midline shift or mass effect. No hydrocephalus or extra-axial fluid collection. Vascular: No abnormal hyperdense vessel. Scattered vascular calcifications noted within the carotid siphons. Skull: Scalp soft tissues demonstrate no acute finding. Calvarium intact. Sinuses/Orbits: Visualized globes and orbital soft tissues within normal limits. Visualized paranasal sinuses are clear. Prior mastoidectomy on the right. No significant mastoid effusion. Other: None. IMPRESSION: 1. No acute intracranial abnormality. 2. Age-related cerebral atrophy with moderate chronic microvascular ischemic disease. Electronically Signed   By: Rise Mu M.D.   On: 06/26/2023 19:36     Family Communication: Discussed with patient,  she understands and agrees. All questions answereed.  Disposition: Status is: Observation  for pain control, BP control, worsening CKD  Planned Discharge Destination: Home with Home Health     Time spent: 40 minutes  Author: Marcelino Duster, MD 06/28/2023 4:02 PM Secure chat 7am to 7pm For on call review [email or web address].

## 2023-06-28 NOTE — Plan of Care (Signed)

## 2023-06-28 NOTE — TOC Initial Note (Addendum)
Transition of Care [facility]) - Initial/Assessment Note    Patient Details  Name: Betty Nielsen MRN: [id number] Date of Birth: Jan 19, 1946  Transition of Care [facility]) CM/SW Contact:    Margarito Liner, LCSW Phone Number: 06/28/2023, 4:16 PM  Clinical Narrative:  CSW met with patient. No supports at bedside. CSW introduced role and explained that therapy recommendations would be discussed. Patient is unsure if she wants home health or DME and wants to think about it. CSW provided CMS scores for home health agencies that serve her zip code. Her son will transport her home at discharge.                Expected Discharge Plan: Home w Home Health Services Barriers to Discharge: Continued Medical Work up   Patient Goals and CMS Choice   CMS Medicare.gov Compare Post Acute Care list provided to:: Patient        Expected Discharge Plan and Services     Post Acute Care Choice:  (TBD) Living arrangements for the past 2 months: Single Family Home                                      Prior Living Arrangements/Services Living arrangements for the past 2 months: Single Family Home Lives with:: Self Patient language and need for interpreter reviewed:: Yes Do you feel safe going back to the place where you live?: Yes      Need for Family Participation in Patient Care: Yes (Comment)     Criminal Activity/Legal Involvement Pertinent to Current Situation/Hospitalization: No - Comment as needed  Activities of Daily Living   ADL Screening (condition at time of admission) Independently performs ADLs?: Yes (appropriate for developmental age) Is the patient deaf or have difficulty hearing?: No Does the patient have difficulty seeing, even when wearing glasses/contacts?: No Does the patient have difficulty concentrating, remembering, or making decisions?: No  Permission Sought/Granted                  Emotional Assessment Appearance:: Appears stated age Attitude/Demeanor/Rapport:  Engaged Affect (typically observed): Appropriate, Calm Orientation: : Oriented to Self, Oriented to Place, Oriented to  Time, Oriented to Situation Alcohol / Substance Use: Not Applicable Psych Involvement: No (comment)  Admission diagnosis:  Lumbar radiculopathy [M54.16] Hypertensive urgency [I16.0] Spinal stenosis [M48.00] Patient Active Problem List   Diagnosis Date Noted   Spinal stenosis 06/26/2023   Hypertensive urgency 06/25/2023   Metabolic acidosis 06/25/2023   Inadequate pain control 06/25/2023   Prediabetes 07/06/2022   Hypertension 07/06/2022   Renal artery stenosis (HCC) 07/14/2021   Hyperkalemia    Accelerated hypertension 06/09/2021   Left upper extremity numbness 06/07/2021   Left facial numbness 06/07/2021   Elevated serum creatinine 06/07/2021   Anemia of chronic disease 11/21/2019   Hypo-osmolality and hyponatremia 11/21/2019   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/21/2019   History of stroke 10/27/2019   Headache 10/27/2016   Hyperlipidemia 10/27/2016   History of non-ST elevation myocardial infarction (NSTEMI) 10/25/2016   History of coronary angioplasty 10/25/2016   BPV (benign positional vertigo) 06/15/2016   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HCC) 11/21/2015   Acid reflux 06/05/2015   Benign secondary hypertension due to renal artery stenosis (HCC) 06/05/2015   Hypothyroidism, 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   Major depression in remission (HCC) 06/05/2015   Obesity (BMI 30.0-34.9) 06/05/2015   Neuropathy 06/05/2015   Perennial allergic rhinitis with seasonal  variation 06/05/2015   Arthritis, degenerative 07/03/2014   AAA (abdominal aortic aneurysm) without rupture (HCC) 06/26/2014   PCP:  Alba Cory, MD Pharmacy:   [facility] [address]), [place] - [address] [place]) [place] [address] Phone: [phone] Fax: [phone]     Social Determinants of Health (SDOH) Social  History: SDOH Screenings   Food Insecurity: No Food Insecurity (06/25/2023)  Housing: Low Risk  (06/25/2023)  Transportation Needs: No Transportation Needs (06/25/2023)  Utilities: Not At Risk (06/25/2023)  Alcohol Screen: Low Risk  (08/25/2022)  Depression (PHQ2-9): Low Risk  (01/05/2023)  Financial Resource Strain: Low Risk  (08/25/2022)  Physical Activity: Inactive (08/25/2022)  Social Connections: Unknown (08/25/2022)  Stress: No Stress Concern Present (08/25/2022)  Tobacco Use: Medium Risk (06/25/2023)   SDOH Interventions:     Readmission Risk Interventions     No data to display

## 2023-06-28 NOTE — Progress Notes (Signed)
Occupational Therapy Treatment Patient Details Name: Betty Nielsen MRN: [id number] DOB: Jul 05, 1946 Today's Date: 06/28/2023   History of present illness Pt is a 77 y.o. female presenting to hospital 06/25/23 with c/o severe back and R flank/leg pain; pt noted to be markedly hypertensive (250's systolic).  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  Neuro intact but with significant pain".  Pt admitted with hypertensive urgency, inadequate pain control in setting of advanced spinal stenosis, metabolic acidosis.  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   OT comments  Pt seen for OT tx. Pt sleeping in recliner, requires VC and tactile cues to wake. Pt agreeable to session and endorsing mild R hip soreness. Pt attempted to stand from the recliner with poor hand placement, requiring MIN A to complete and unable to maintain balance, plopping back into recliner 2/2 posterior LOB. On 2nd attempt, pt followed VC for hand placement and demonstrated improvement requiring CGA for STS and no posterior LOB. After standing for a few moments, pt felt confident in leaving the recliner. Pt ambulated with RW to the sink requiring SBA-CGA for balance and relying on forearm support on the counter for stability while brushing her teeth. Pt returned to bed afterwards with CGA for BLE mgt. Pt demonstrating increased balance deficits as compared to previous session. Pt continues to benefit from skilled OT services.      If plan is discharge home, recommend the following:  A little help with walking and/or transfers;A little help with bathing/dressing/bathroom;Assistance with cooking/housework;Help with stairs or ramp for entrance;Assist for transportation   Equipment Recommendations  BSC/3in1    Recommendations for Other Services      Precautions / Restrictions  Precautions Precautions: Fall Restrictions Weight Bearing Restrictions: No       Mobility Bed Mobility Overal bed mobility: Needs Assistance Bed Mobility: Sit to Supine       Sit to supine: Contact guard assist   General bed mobility comments: increased effort/time    Transfers Overall transfer level: Needs assistance Equipment used: Rolling walker (2 wheels) Transfers: Sit to/from Stand Sit to Stand: Contact guard assist, Min assist           General transfer comment: Initially requiring MIN A with pt attempting to pull on RW. Stands briefly and then loses balance backwards and sits back down in recliner. VC for hand placement with improvement noted requiring only CGA     Balance Overall balance assessment: Needs assistance Sitting-balance support: No upper extremity supported, Feet supported, Single extremity supported Sitting balance-Leahy Scale: Fair     Standing balance support: Bilateral upper extremity supported, During functional activity, Reliant on assistive device for balance, Single extremity supported Standing balance-Leahy Scale: Fair Standing balance comment: initial poor with LOB posteriorly returning to sit in recliner. On 2nd attempt standing balance fair. Reliant on RW for mobility and reliant on sink counter while completing grooming tasks                           ADL either performed or assessed with clinical judgement   ADL Overall ADL's : Needs assistance/impaired     Grooming: Wash/dry hands;Supervision/safety;Standing;Oral care;Contact guard assist Grooming Details (indicate cue type and reason): SBA, intermittent forearm support on counter for stability  Functional mobility during ADLs: Contact guard assist;Rolling walker (2 wheels);Cueing for sequencing      Extremity/Trunk Assessment              Vision       Perception     Praxis      Cognition Arousal: Alert Behavior  During Therapy: Flat affect Overall Cognitive Status: Within Functional Limits for tasks assessed                                          Exercises      Shoulder Instructions       General Comments      Pertinent Vitals/ Pain       Pain Assessment Pain Assessment: Faces Faces Pain Scale: Hurts a little bit Pain Location: R hip Pain Descriptors / Indicators: Grimacing, Aching Pain Intervention(s): Monitored during session, Repositioned  Home Living                                          Prior Functioning/Environment              Frequency  Min 1X/week        Progress Toward Goals  OT Goals(current goals can now be found in the care plan section)  Progress towards OT goals: Progressing toward goals  Acute Rehab OT Goals Patient Stated Goal: go home OT Goal Formulation: With patient Time For Goal Achievement: 07/11/23 Potential to Achieve Goals: Good  Plan      Co-evaluation                 AM-PAC OT "6 Clicks" Daily Activity     Outcome Measure   Help from another person eating meals?: None Help from another person taking care of personal grooming?: A Little Help from another person toileting, which includes using toliet, bedpan, or urinal?: A Little Help from another person bathing (including washing, rinsing, drying)?: A Little Help from another person to put on and taking off regular upper body clothing?: A Little Help from another person to put on and taking off regular lower body clothing?: A Little 6 Click Score: 19    End of Session Equipment Utilized During Treatment: Rolling walker (2 wheels);Gait belt  OT Visit Diagnosis: Muscle weakness (generalized) (M62.81);Unsteadiness on feet (R26.81);Other abnormalities of gait and mobility (R26.89)   Activity Tolerance Patient tolerated treatment well   Patient Left in bed;with call bell/phone within reach;with bed alarm set;with nursing/sitter in room    Nurse Communication          Time: 1610-9604 OT Time Calculation (min): 12 min  Charges: OT General Charges $OT Visit: 1 Visit OT Treatments $Self Care/Home Management : 8-22 mins  Arman Filter., MPH, MS, OTR/L ascom [phone] 06/28/23, 2:59 PM

## 2023-06-28 NOTE — Plan of Care (Signed)

## 2023-06-29 DIAGNOSIS — I160 Hypertensive urgency: Secondary | ICD-10-CM | POA: Diagnosis not present

## 2023-06-29 LAB — BASIC METABOLIC PANEL
Anion gap: 9 (ref 5–15)
BUN: 85 mg/dL — ABNORMAL HIGH (ref 8–23)
CO2: 20 mmol/L — ABNORMAL LOW (ref 22–32)
Calcium: 8.5 mg/dL — ABNORMAL LOW (ref 8.9–10.3)
Chloride: 101 mmol/L (ref 98–111)
Creatinine, Ser: 5.02 mg/dL — ABNORMAL HIGH (ref 0.44–1.00)
GFR, Estimated: 8 mL/min — ABNORMAL LOW (ref >60)
Glucose, Bld: 99 mg/dL (ref 70–99)
Potassium: 4.7 mmol/L (ref 3.5–5.1)
Sodium: 130 mmol/L — ABNORMAL LOW (ref 135–145)

## 2023-06-29 LAB — CBC
HCT: 23.7 % — ABNORMAL LOW (ref 36.0–46.0)
Hemoglobin: 8 g/dL — ABNORMAL LOW (ref 12.0–15.0)
MCH: 30.9 pg (ref 26.0–34.0)
MCHC: 33.8 g/dL (ref 30.0–36.0)
MCV: 91.5 fL (ref 80.0–100.0)
Platelets: 176 10*3/uL (ref 150–400)
RBC: 2.59 MIL/uL — ABNORMAL LOW (ref 3.87–5.11)
RDW: 13.2 % (ref 11.5–15.5)
WBC: 6.2 10*3/uL (ref 4.0–10.5)
nRBC: 0 % (ref 0.0–0.2)

## 2023-06-29 MED ORDER — LABETALOL HCL 200 MG PO TABS: 100.0000 mg | ORAL_TABLET | Freq: Two times a day (BID) | ORAL | 0 refills | Status: DC

## 2023-06-29 MED ORDER — SENNOSIDES-DOCUSATE SODIUM 8.6-50 MG PO TABS: 1.0000 | ORAL_TABLET | Freq: Every evening | ORAL | 0 refills | Status: AC | PRN

## 2023-06-29 MED ORDER — ACETAMINOPHEN ER 650 MG PO TBCR: 650.0000 mg | EXTENDED_RELEASE_TABLET | Freq: Three times a day (TID) | ORAL | 1 refills | Status: AC | PRN

## 2023-06-29 NOTE — Plan of Care (Signed)

## 2023-06-29 NOTE — Progress Notes (Signed)
Physical Therapy Treatment Patient Details Name: Betty Nielsen MRN: [id number] DOB: [birth year] Today's Date: 06/29/2023   History of Present Illness Pt is a 77 y.o. female presenting to hospital 06/25/23 with c/o severe back and R flank/leg pain; pt noted to be markedly hypertensive (250's systolic).  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  Neuro intact but with significant pain".  Pt admitted with hypertensive urgency, inadequate pain control in setting of advanced spinal stenosis, metabolic acidosis.  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    PT Comments  Patient received in recliner. She is HOH. Seems she hears best on her left side. She is agreeable to session. Patient reports B knee pain due to OA. She requires min A to stand with posterior leaning initially. Cues needed to stay within frame of walker to reduce burden on B knees. Labored gait. Patient ambulated 170 feet with Cga. Fatigued with this, requiring 1 brief standing rest break. Patient becoming less safe with mobility as distance increased. She will continue to benefit from skilled PT to improve functional independence, safety and strength.       If plan is discharge home, recommend the following: A little help with walking and/or transfers;A little help with bathing/dressing/bathroom;Assistance with cooking/housework;Assist for transportation;Help with stairs or ramp for entrance   Can travel by private vehicle        Equipment Recommendations  Rolling walker (2 wheels)    Recommendations for Other Services       Precautions / Restrictions Precautions Precautions: Fall Restrictions Weight Bearing Restrictions: No     Mobility  Bed Mobility               General bed mobility comments: NT, patient up in recliner    Transfers Overall transfer level: Needs  assistance Equipment used: Rolling walker (2 wheels) Transfers: Sit to/from Stand Sit to Stand: Min assist           General transfer comment: Patient requires min A to stand with initial posterior leaning. Takes a minute or two to achieve standing balance.    Ambulation/Gait Ambulation/Gait assistance: Contact guard assist, Min assist Gait Distance (Feet): 170 Feet Assistive device: Rolling walker (2 wheels) Gait Pattern/deviations: Step-through pattern, Decreased step length - right, Decreased step length - left, Decreased stride length, Trunk flexed Gait velocity: decreased     General Gait Details: flexed posture; vc's to keep walker closer towards end of ambulation (with fatigue)   Stairs             Wheelchair Mobility     Tilt Bed    Modified Rankin (Stroke Patients Only)       Balance Overall balance assessment: Needs assistance Sitting-balance support: Feet supported Sitting balance-Leahy Scale: Good     Standing balance support: Bilateral upper extremity supported, During functional activity, Reliant on assistive device for balance Standing balance-Leahy Scale: Fair Standing balance comment: initial poor with posterior leaning requiring min A                            Cognition Arousal: Alert Behavior During Therapy: Flat affect Overall Cognitive Status: Within Functional Limits for tasks assessed  General Comments: Increased time to follow cues and requiring repeated cues at times (complicated d/t [facility])        Exercises      General Comments        Pertinent Vitals/Pain Pain Assessment Pain Assessment: Faces Faces Pain Scale: Hurts little more Pain Location: B knees Pain Descriptors / Indicators: Discomfort, Sore Pain Intervention(s): Monitored during session, Repositioned    Home Living                          Prior Function            PT Goals (current  goals can now be found in the care plan section) Acute Rehab PT Goals Patient Stated Goal: to improve mobility PT Goal Formulation: With patient Time For Goal Achievement: 07/11/23 Potential to Achieve Goals: Good Progress towards PT goals: Progressing toward goals    Frequency    Min 1X/week      PT Plan      Co-evaluation              AM-PAC PT "6 Clicks" Mobility   Outcome Measure  Help needed turning from your back to your side while in a flat bed without using bedrails?: None Help needed moving from lying on your back to sitting on the side of a flat bed without using bedrails?: A Little Help needed moving to and from a bed to a chair (including a wheelchair)?: A Little Help needed standing up from a chair using your arms (e.g., wheelchair or bedside chair)?: A Little Help needed to walk in hospital room?: A Little Help needed climbing 3-5 steps with a railing? : A Lot 6 Click Score: 18    End of Session Equipment Utilized During Treatment: Gait belt Activity Tolerance: Patient tolerated treatment well;Patient limited by fatigue Patient left: in chair;with call bell/phone within reach;with chair alarm set Nurse Communication: Mobility status PT Visit Diagnosis: Muscle weakness (generalized) (M62.81);Other abnormalities of gait and mobility (R26.89);Unsteadiness on feet (R26.81);Pain Pain - Right/Left:  (Bilateral) Pain - part of body: Knee     Time: 6440-3474 PT Time Calculation (min) (ACUTE ONLY): 15 min  Charges:    $Gait Training: 8-22 mins PT General Charges $$ ACUTE PT VISIT: 1 Visit                     Gokul Waybright, PT, GCS 06/29/23,10:34 AM

## 2023-06-29 NOTE — TOC Transition Note (Signed)
Transition of Care [facility]) - CM/SW Discharge Note   Patient Details  Name: Betty Nielsen MRN: [id number] Date of Birth: November 25, 1945  Transition of Care [facility]) CM/SW Contact:  Truddie Hidden, RN Phone Number: 06/29/2023, 11:09 AM   Clinical Narrative:    Attempt to reach patient to confirm DME and HH is still not desired. No answer. Unable to leave a message.    TOC signing off.       Barriers to Discharge: Continued Medical Work up   Patient Goals and CMS Choice CMS Medicare.gov Compare Post Acute Care list provided to:: Patient    Discharge Placement                         Discharge Plan and Services Additional resources added to the After Visit Summary for       Post Acute Care Choice:  (TBD)                               Social Determinants of Health (SDOH) Interventions SDOH Screenings   Food Insecurity: No Food Insecurity (06/25/2023)  Housing: Low Risk  (06/25/2023)  Transportation Needs: No Transportation Needs (06/25/2023)  Utilities: Not At Risk (06/25/2023)  Alcohol Screen: Low Risk  (08/25/2022)  Depression (PHQ2-9): Low Risk  (01/05/2023)  Financial Resource Strain: Low Risk  (08/25/2022)  Physical Activity: Inactive (08/25/2022)  Social Connections: Unknown (08/25/2022)  Stress: No Stress Concern Present (08/25/2022)  Tobacco Use: Medium Risk (06/25/2023)     Readmission Risk Interventions     No data to display

## 2023-06-29 NOTE — Discharge Summary (Signed)
Physician Discharge Summary   Patient: Betty Nielsen MRN: [id number] DOB: [birth year]  Admit date:     06/25/2023  Discharge date: 06/29/23  Discharge Physician: Loyce Dys   PCP: Alba Cory, MD   Recommendations at discharge:  Follow-up with PCP and nephrologist  Discharge Diagnoses:  Hypertensive urgency Inadequate pain control Advanced Lumbar spinal stenosis Acute metabolic encephalopathy- Metabolic acidosis Worsening CKD stage IV Hyperkalemia -improved Anemia of chronic disease Hyperlipidemia Hypothyroidism, postsurgical  Hospital Course: Mr. 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   Patient reports the pain has been ongoing for the last 2 days, severe, unrelenting, 10 out of 10.   Vitals in the ED showed temperature of 97.9, respiration rate 18, heart rate 60, blood pressure 246/101, currently 210/91, SpO2 100% on room air.   Serum sodium 135, potassium 4.3, chloride 109, bicarb 19, BUN 50, serum creatinine of 3.58, EGFR 13, nonfasting blood glucose 99, WBC 7.2, hemoglobin 8.1, platelets of 205.   Patient was found to have advanced lumbar spinal stenosis.  Was seen by neurosurgery team and patient opted for conservative management.  During hospitalization patient developed confusion after she was prescribed with opioids/oxycodone.  For which we avoided and transition patient to only Tylenol.  Given worsening renal function patient was also seen by nephrologist with improvement in renal function. Patient has been seen by PT OT and recommended for home health therapy.  Patient is therefore being discharged today to follow-up with nephrology.  Consultants: Nephrology, neurosurgery Procedures performed: None Disposition: Home health Diet  recommendation:  Cardiac diet DISCHARGE MEDICATION: Allergies as of 06/29/2023       Reactions   Ace Inhibitors Cough   Ciprofloxacin Other (See Comments)   unknown   Citalopram    hyponatremia   Hydrocodone-acetaminophen Other (See Comments)   unknown   Norvasc [amlodipine Besylate]    Pruritus   Pantoprazole Sodium Other (See Comments)   Cramps   Pravastatin Other (See Comments)   Stomach cramps   Sulfa Antibiotics    unknown   Clonidine Other (See Comments)   Per patient told by allergist that would be best to stop but nephrologist recommended her to stay on medication         Medication List     STOP taking these medications    cetirizine 10 MG tablet Commonly known as: ZYRTEC   torsemide 20 MG tablet Commonly known as: DEMADEX       TAKE these medications    acetaminophen 650 MG CR tablet Commonly known as: TYLENOL Take 1 tablet (650 mg total) by mouth every 8 (eight) hours as needed for pain.   aspirin EC 81 MG tablet Take 81 mg by mouth daily. Swallow whole.   atorvastatin 40 MG tablet Commonly known as: LIPITOR TAKE 1 TABLET BY MOUTH ONCE DAILY AT  6PM.  CHOLESTEROL  MEDICATION.   calcitRIOL 0.25 MCG capsule Commonly known as: ROCALTROL Take 0.25 mcg by mouth daily.   Cholecalciferol 125 MCG (5000 UT) Tabs Take 5,000 Units by mouth daily.   cloNIDine 0.2 MG tablet Commonly known as: CATAPRES Take 1 tablet (0.2 mg total) by mouth 2 (two) times daily.   clopidogrel 75 MG tablet Commonly known as: PLAVIX TAKE 1 TABLET BY MOUTH ONCE DAILY .  BLOOD  THINNER  TO  HELP  REDUCE  STROKE  RISK   ezetimibe 10 MG tablet Commonly known as: ZETIA Take 1 tablet (10 mg total) by mouth daily.   labetalol 200 MG tablet Commonly known as: NORMODYNE Take 0.5 tablets (100 mg total) by mouth 2 (two) times daily. What changed: how much to take   latanoprost 0.005 % ophthalmic solution Commonly known as: XALATAN Place 1 drop into both eyes at bedtime.    levothyroxine 75 MCG tablet Commonly known as: SYNTHROID Take 1 tablet (75 mcg total) by mouth daily before breakfast.   senna-docusate 8.6-50 MG tablet Commonly known as: Senokot-S Take 1 tablet by mouth at bedtime as needed for mild constipation.        Discharge Exam: Filed Weights   06/25/23 0651 06/28/23 0435  Weight: 72.8 kg 78.7 kg   General - Elderly African-American female, sitting in no apparent distress HEENT - PERRLA, EOMI, atraumatic head, hard of hearing Lung - Clear, no rales, rhonchi, wheezes. Heart - S1, S2 heard, no murmurs, rubs, trace pedal edema. Abdomen - Soft, non tender nondistended, bowel sounds good Neuro - Alert, awake and oriented x 3, non focal exam. Skin - Warm and dry.  Condition at discharge: good  The results of significant diagnostics from this hospitalization (including imaging, microbiology, ancillary and laboratory) are listed below for reference.   Imaging Studies: DG Chest 2 View  Result Date: 06/27/2023 CLINICAL DATA:  Stage 5 chronic kidney disease EXAM: CHEST - 2 VIEW COMPARISON:  Prior chest x-ray 06/09/2021 FINDINGS: Stable cardiomegaly. Aortic atherosclerotic calcifications in the transverse aorta. The lungs are clear. No pulmonary edema, pleural effusion or pneumothorax. Chronic elevation of the left hemidiaphragm is again noted with associated left basilar atelectasis. IMPRESSION: 1. No acute cardiopulmonary process. 2. Stable cardiomegaly. 3. Aortic atherosclerotic vascular calcifications. 4. Chronic elevation of the left hemidiaphragm. Electronically Signed   By: Malachy Moan M.D.   On: 06/27/2023 13:12   CT HEAD WO CONTRAST ( )  Result Date: 06/26/2023 CLINICAL DATA:  Initial evaluation for neuro deficit, stroke suspected. EXAM: CT HEAD WITHOUT CONTRAST TECHNIQUE: Contiguous axial images were obtained from the base of the skull through the vertex without intravenous contrast. RADIATION DOSE REDUCTION: This exam was  performed according to the departmental dose-optimization program which includes automated exposure control, adjustment of the mA and/or kV according to patient size and/or use of iterative reconstruction technique. COMPARISON:  Prior study from 06/09/2021. FINDINGS: Brain: Age-related cerebral atrophy with moderate chronic microvascular ischemic disease. Few scatter remote lacunar infarcts present about the bilateral basal ganglia. No acute intracranial hemorrhage. No acute large vessel territory infarct. No mass lesion, midline shift or mass effect. No hydrocephalus or extra-axial fluid collection. Vascular: No abnormal hyperdense vessel. Scattered vascular calcifications noted within the carotid siphons. Skull: Scalp soft tissues demonstrate no acute finding. Calvarium intact. Sinuses/Orbits: Visualized globes and orbital soft tissues within normal limits. Visualized paranasal sinuses are clear. Prior mastoidectomy on the right. No significant mastoid effusion. Other: None. IMPRESSION: 1. No acute intracranial abnormality. 2. Age-related cerebral atrophy with moderate chronic microvascular ischemic disease. Electronically Signed   By: Rise Mu M.D.   On: 06/26/2023 19:36   MR ANGIO ABDOMEN WO CONTRAST  Result Date: 06/25/2023 CLINICAL DATA:  Severe abdominal and back pain radiating into leg, history of aortic aneurysm post repair, hypertension EXAM: MRA ABDOMEN AND PELVIS WITH CONTRAST TECHNIQUE: Multiplanar, multiecho pulse sequences of the abdomen and pelvis were obtained with intravenous contrast. Angiographic images of abdomen and pelvis were obtained using MRA technique  with intravenous contrast. CONTRAST:  None COMPARISON:  CT 02/13/2023 FINDINGS: ARTERIAL Aorta: Fusiform suprarenal dilatation up to 3.8 cm. Stable changes of infrarenal tube graft repair. Celiac axis:          Patent Superior mesenteric: Patent proximally, limited distal visualization Left renal:           Limited evaluation  Right renal:          Limited evaluation Inferior mesenteric:  Not visualized Left iliac:           1.5 cm diameter Right iliac:          2 cm diameter VENOUS Limited evaluation, no acute findings. NONVASCULAR Lower chest:  No acute findings. Hepatobiliary: No masses or other significant abnormality. Pancreas: No mass, inflammatory changes, or other significant abnormality. Spleen: Within normal limits in size and appearance. Adrenals/Urinary Tract: No adrenal mass. Multiple cortical lesions in both kidneys, many of which can be characterized as cysts, largest 6.3 cm left lower pole; no followup recommended. Stomach/Bowel: No evidence of obstruction, inflammatory process, or abnormal fluid collections. Lymphatic: No pathologically enlarged lymph nodes. Reproductive: No mass or other significant abnormality. Other: No retroperitoneal hematoma.  No ascites. Musculoskeletal: No suspicious bone lesions identified. IMPRESSION: 1. Stable changes of infrarenal aortic repair. 2. Stable suprarenal dilatation up to 3.8 cm. 3. No acute findings. Electronically Signed   By: Corlis Leak M.D.   On: 06/25/2023 12:53   MR LUMBAR SPINE WO CONTRAST  Result Date: 06/25/2023 CLINICAL DATA:  Low back pain with symptoms persisting over 6 weeks of treatment. EXAM: MRI LUMBAR SPINE WITHOUT CONTRAST TECHNIQUE: Multiplanar, multisequence MR imaging of the lumbar spine was performed. No intravenous contrast was administered. COMPARISON:  02/23/2023 abdominal CT FINDINGS: Segmentation:  Standard. Alignment:  Mild scoliosis and L3-4, L4-5 anterolisthesis. Vertebrae: No fracture, evidence of discitis, or bone lesion. Minimal degenerative edema at the L3 superior endplate. Conus medullaris and cauda equina: Conus extends to the L1 level. Conus and cauda equina appear normal. Paraspinal and other soft tissues: Aneurysmal aorta affecting infra and suprarenal segments and measuring up to 4.26 cm at the hiatus, there is contemporaneous MRA of the  abdomen. Polycystic kidneys. Disc levels: L1-L2: Small right paracentral protrusion. Moderate right subarticular recess narrowing. L2-L3: Bulging disc with right paracentral extrusion that is downward migrating and markedly impinges on the descending right L3 nerve root. Herniation nearly reaches the L3-4 disc space. Mild to moderate spinal canal narrowing. Patent foramina L3-L4: Disc narrowing and bulging with right foraminal protrusion. Degenerative facet spurring and ligamentum flavum thickening. Advanced spinal stenosis. Moderate right foraminal narrowing. L4-L5: Facet degeneration with spurring and anterolisthesis. Circumferential disc bulging. Moderate spinal stenosis with effacement of both subarticular recesses. L5-S1:Disc narrowing with endplate and facet spurring. The canal and foramina are patent. IMPRESSION: 1. Advanced and generalized lumbar spine degeneration with mild scoliosis and multilevel listhesis. Dominant findings at L2-3 where there is a large right paracentral extrusion that migrates inferiorly and severely impinges on the right L3 nerve root. 2. 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 Up to moderate foraminal narrowing on the right at L3-4. Electronically Signed   By: Tiburcio Pea M.D.   On: 06/25/2023 12:33    Microbiology: Results for orders placed or performed during the hospital encounter of 02/23/23  Urine Culture     Status: None   Collection Time: 02/23/23  1:46 AM   Specimen: Urine, Clean Catch  Result Value Ref Range Status   Specimen Description   Final  URINE, CLEAN CATCH Performed at [facility], [address], [place], [place] [address]    Special Requests   Final    NONE Performed at [facility], [address], [place], [place] [address]    Culture   Final    NO GROWTH Performed at [facility], [address], [place], [place] [address]    Report Status 02/24/2023 FINAL  Final    Labs: CBC: Recent Labs   Lab 06/25/23 0658 06/26/23 0431 06/28/23 0449 06/29/23 0458  WBC 7.2 7.1 6.3 6.2  NEUTROABS 4.4  --   --   --   HGB 8.1* 8.6* 7.9* 8.0*  HCT 24.7* 25.8* 24.6* 23.7*  MCV 94.3 92.8 93.9 91.5  PLT 205 220 180 176   Basic Metabolic Panel: Recent Labs  Lab 06/25/23 0658 06/26/23 0431 06/27/23 0458 06/28/23 0449 06/29/23 0458  NA 135 135 133* 131* 130*  K 4.3 5.2* 5.1 4.8 4.7  CL 109 108 105 105 101  CO2 17* 16* 18* 19* 20*  GLUCOSE 99 87 99 99 99  BUN 50* 47* 63* 77* 85*  CREATININE 3.58* 3.72* 4.50* 5.25* 5.02*  CALCIUM 8.9 8.9 8.6* 8.3* 8.5*  PHOS  --   --  5.6*  --   --    Liver Function Tests: Recent Labs  Lab 06/25/23 0658 06/27/23 0458  AST 23  --   ALT 12  --   ALKPHOS 67  --   BILITOT 0.8  --   PROT 6.7  --   ALBUMIN 3.6 3.5   CBG: Recent Labs  Lab 06/27/23 2116 06/28/23 0507  GLUCAP 105* 82    Discharge time spent:  36 minutes.  Signed: Loyce Dys, MD [facility] 06/29/2023

## 2023-06-29 NOTE — Plan of Care (Signed)
Care plan complete

## 2023-06-30 ENCOUNTER — Telehealth: Payer: Self-pay

## 2023-06-30 NOTE — Transitions of Care (Post Inpatient/ED Visit) (Signed)
06/30/2023  Name: Betty Nielsen MRN: [id number] DOB: 1945/11/09  Today's TOC FU Call Status: Today's TOC FU Call Status:: Successful TOC FU Call Completed TOC FU Call Complete Date: 06/30/23 Patient's Name and Date of Birth confirmed.  Transition Care Management Follow-up Telephone Call Date of Discharge: 06/29/23 Discharge Facility: [facility]) Type of Discharge: Inpatient Admission Primary Inpatient Discharge Diagnosis:: radiculopathy How have you been since you were released from the hospital?: Better Any questions or concerns?: No  Items Reviewed: Did you receive and understand the discharge instructions provided?: Yes Medications obtained,verified, and reconciled?: Yes (Medications Reviewed) Any new allergies since your discharge?: No Dietary orders reviewed?: Yes Do you have support at home?: Yes People in Home: child(ren), adult  Medications Reviewed Today: Medications Reviewed Today     Reviewed by Karena Addison, LPN (Licensed Practical Nurse) on 06/30/23 at 1014  Med List Status: <None>   Medication Order Taking? Sig Documenting Provider Last Dose Status Informant  acetaminophen (TYLENOL) 650 MG CR tablet [id number]  Take 1 tablet (650 mg total) by mouth every 8 (eight) hours as needed for pain. Loyce Dys, MD  Active   aspirin EC 81 MG tablet [id number] No Take 81 mg by mouth daily. Swallow whole. [provider] 06/24/2023 Active Self  atorvastatin (LIPITOR) 40 MG tablet [id number] No TAKE 1 TABLET BY MOUTH ONCE DAILY AT  6PM.  CHOLESTEROL  MEDICATION. Alba Cory, MD 06/24/2023 Active Self  calcitRIOL (ROCALTROL) 0.25 MCG capsule [id number] No Take 0.25 mcg by mouth daily. [provider] 06/24/2023 Active Self  Cholecalciferol 125 MCG (5000 UT) TABS [id number] No Take 5,000 Units by mouth daily. [provider] 06/24/2023 Active Self  cloNIDine (CATAPRES) 0.2 MG tablet [id number] No Take 1 tablet (0.2 mg  total) by mouth 2 (two) times daily. Alba Cory, MD 06/24/2023 Active Self  clopidogrel (PLAVIX) 75 MG tablet [id number] No TAKE 1 TABLET BY MOUTH ONCE DAILY .  BLOOD  THINNER  TO  HELP  REDUCE  STROKE  RISK Alba Cory, MD 06/24/2023 Active Self  ezetimibe (ZETIA) 10 MG tablet [id number] No Take 1 tablet (10 mg total) by mouth daily. Alba Cory, MD 06/24/2023 Active Self  labetalol (NORMODYNE) 200 MG tablet [id number]  Take 0.5 tablets (100 mg total) by mouth 2 (two) times daily. Loyce Dys, MD  Active   latanoprost (XALATAN) 0.005 % ophthalmic solution [id number] No Place 1 drop into both eyes at bedtime. [provider] 06/24/2023 Active Self  levothyroxine (SYNTHROID) 75 MCG tablet [id number] No Take 1 tablet (75 mcg total) by mouth daily before breakfast. Alba Cory, MD 06/24/2023 Active Self  senna-docusate (SENOKOT-S) 8.6-50 MG tablet [id number]  Take 1 tablet by mouth at bedtime as needed for mild constipation. Loyce Dys, MD  Active             Home Care and Equipment/Supplies: Were Home Health Services Ordered?: NA Any new equipment or medical supplies ordered?: NA  Functional Questionnaire: Do you need assistance with bathing/showering or dressing?: No Do you need assistance with meal preparation?: No Do you need assistance with eating?: No Do you have difficulty maintaining continence: No Do you need assistance with getting out of bed/getting out of a chair/moving?: No Do you have difficulty managing or taking your medications?: No  Follow up appointments reviewed: PCP Follow-up appointment confirmed?: No (patient declined hospital f/u, states will just keep 40mo f/u visit) Specialist Hospital Follow-up appointment confirmed?: Yes Date of Specialist follow-up appointment?: 06/29/23 Follow-Up  Specialty Provider:: nephro Do you need transportation to your follow-up appointment?: No Do you understand care options if your condition(s) worsen?:  Yes-patient verbalized understanding    SIGNATURE Karena Addison, LPN [facility] Nurse Health Advisor Direct Dial [phone]

## 2023-07-07 NOTE — Progress Notes (Signed)
Name: Betty Nielsen   MRN: [id number]    DOB: March 26, 1946   Date:07/08/2023       Progress Note  Subjective  Chief Complaint  Hospital follow up/ also regular follow up  She came in with her son [facility] discharge follow up:   Admission Date: 06/25/2023 Discharge Date: 06/29/2023  Transition of care phone visit: 06/30/2023   Medication reconciliation was done but seems like she is taking 150 mg of labetolol BID so we will adjust to 200 mg BID. I asked them to verify how she is taking medication when they get home   Patient called her son in the middle of the night due to inability to get out of bed to void. Her son went to help her and realized she was in too much pain and called 911, she was transported to [facility].  She was diagnosed with hypertensive urgency, Kidney function was low, she had an episode of confusion that may have been secondary to opioid use. The cause of the pain was spinal stenosis but not interested in surgery, she had PT and OT at [facility] and sent home. She came in with her son today and states pain is under control. She is using a walker. She does not want home PT or OT   CKI stage V: she is under the care of  [facility], she was hospitalized end of 05/2021 and developed acute on chronic kidney disease,GFR was as low as 7  but improved after that .  She had a renal artery stenosis, status post stent placement . She is under the care of Dr. Cherylann Ratel, kidney function dropped again during hospital stay October 2024. She needs to follow up with him again .  Iron deficiency anemia: also has anemia of chronic disease ,  last visit with Dr. Cathie Hoops was Dec 2022, she had 3 iron infusions in 2022. She gets HCT monitored by nephrologist. Hemoglobin during hospital stay was stable    HTN/CAD/Angina/Stroke: bp today is in good control, taking bp medications as prescribed,  palpitation or dizziness  She has not used any NTG since Feb 2018 ( when she had MI). She had a Stroke  10/2019 , she is back on  plavix and is taking statin therapy and Zetia. She no longer wants to see cardiologist    Atherosclerosis abdominal aorta/hyperlipidemia: she has been taking Atorvastatin, plavix and aspirin , no longer having problems with statin therapy,  history of aneurysmal of aorta repair done by Dr. Durwin Nora ( vascular surgeon in [place]),, last visit with [facility] was 05/2022 , she did not go back for follow up and did not have the studies ordered last year   Familial hypercholesterinemia : since 10/2019 she has been taking Atorvastatin  and Zetia, LDL in 2023 down from 226 to 99, continue current regiment. We will recheck labs today   Hypothyroidism: she is s/p thyroidectomy Compliant with medication, denies dry skin, states constipation under control with dietary modification and increase in fiber diet. We will recheck labs today    Major Depression in remission : she is off SSRI because of hyponatremia, but states she is feeling well, in remission. 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 She denies sadness, crying spells, change in appetite, or suicidal thoughts. she is frustrated because son is worried about her leaving her house since hospital stay. She has two steps to go down and they are trying to get a ramp placed so  she can use her walker   Metabolic syndrome: Last A1C improved from  5.7 % down to 5.5 % . She denies polyphagia, polydipsia or polyuria   Patient Active Problem List   Diagnosis Date Noted   Spinal stenosis 06/26/2023   Hypertensive urgency 06/25/2023   Metabolic acidosis 06/25/2023   Inadequate pain control 06/25/2023   Benign hypertensive kidney disease with chronic kidney disease 03/09/2023   Proteinuria 03/09/2023   Prediabetes 07/06/2022   Hypertension 07/06/2022   Renal artery stenosis (HCC) 07/14/2021   Hyperkalemia    Accelerated hypertension 06/09/2021   Left upper extremity numbness 06/07/2021   Left facial numbness 06/07/2021    Elevated serum creatinine 06/07/2021   Anemia of chronic disease 11/21/2019   Hypo-osmolality and hyponatremia 11/21/2019   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/21/2019   Chronic kidney disease (CKD), stage V (HCC) 11/21/2019   History of stroke 10/27/2019   Headache 10/27/2016   Hyperlipidemia 10/27/2016   History of non-ST elevation myocardial infarction (NSTEMI) 10/25/2016   History of coronary angioplasty 10/25/2016   BPV (benign positional vertigo) 06/15/2016   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HCC) 11/21/2015   Acid reflux 06/05/2015   Benign secondary hypertension due to renal artery stenosis (HCC) 06/05/2015   Hypothyroidism, 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   Major depression in remission (HCC) 06/05/2015   Obesity (BMI 30.0-34.9) 06/05/2015   Neuropathy 06/05/2015   Perennial allergic rhinitis with seasonal variation 06/05/2015   Arthritis, degenerative 07/03/2014   AAA (abdominal aortic aneurysm) without rupture (HCC) 06/26/2014    Past Surgical History:  Procedure Laterality Date   ABDOMINAL AORTIC ANEURYSM REPAIR     ABDOMINAL HYSTERECTOMY     APPENDECTOMY     CORONARY STENT INTERVENTION N/A 10/26/2016   Procedure: Coronary Stent Intervention;  Surgeon: Alwyn Pea, MD;  Location: [facility] INVASIVE CV LAB;  Service: Cardiovascular;  Laterality: N/A;   HERNIA REPAIR     INNER EAR SURGERY     pt not sure of type   LEFT HEART CATH AND CORONARY ANGIOGRAPHY N/A 10/26/2016   Procedure: Left Heart Cath and Coronary Angiography;  Surgeon: Dalia Heading, MD;  Location: [facility] INVASIVE CV LAB;  Service: Cardiovascular;  Laterality: N/A;   RENAL ANGIOGRAPHY N/A 06/11/2021   Procedure: RENAL ANGIOGRAPHY;  Surgeon: Annice Needy, MD;  Location: [facility] INVASIVE CV LAB;  Service: Cardiovascular;  Laterality: N/A;   THYROIDECTOMY      Family History  Problem Relation Age of Onset   Healthy Mother    Cerebral aneurysm Father     Social History   Tobacco Use    Smoking status: Former    Current packs/day: 0.00    Average packs/day: 0.5 packs/day for 20.0 years (10.0 ttl pk-yrs)    Types: Cigarettes    Start date: 68    Quit date: 2000    Years since quitting: 24.8   Smokeless tobacco: Never   Tobacco comments:    smoking cessation materials not required  Substance Use Topics   Alcohol use: No    Alcohol/week: 0.0 standard drinks of alcohol     Current Outpatient Medications:    acetaminophen (TYLENOL) 650 MG CR tablet, Take 1 tablet (650 mg total) by mouth every 8 (eight) hours as needed for pain., Disp: 20 tablet, Rfl: 1   aspirin EC 81 MG tablet, Take 81 mg by mouth daily. Swallow whole., Disp: , Rfl:    calcitRIOL (ROCALTROL) 0.25 MCG capsule, Take 0.25 mcg by mouth daily., Disp: , Rfl:  Cholecalciferol 125 MCG (5000 UT) TABS, Take 5,000 Units by mouth daily., Disp: , Rfl:    clopidogrel (PLAVIX) 75 MG tablet, TAKE 1 TABLET BY MOUTH ONCE DAILY .  BLOOD  THINNER  TO  HELP  REDUCE  STROKE  RISK, Disp: 90 tablet, Rfl: 1   latanoprost (XALATAN) 0.005 % ophthalmic solution, Place 1 drop into both eyes at bedtime., Disp: , Rfl:    senna-docusate (SENOKOT-S) 8.6-50 MG tablet, Take 1 tablet by mouth at bedtime as needed for mild constipation., Disp: 60 tablet, Rfl: 0   atorvastatin (LIPITOR) 40 MG tablet, TAKE 1 TABLET BY MOUTH ONCE DAILY AT  6PM.  CHOLESTEROL  MEDICATION., Disp: 90 tablet, Rfl: 1   cloNIDine (CATAPRES) 0.2 MG tablet, Take 1 tablet (0.2 mg total) by mouth 2 (two) times daily., Disp: 180 tablet, Rfl: 1   ezetimibe (ZETIA) 10 MG tablet, Take 1 tablet (10 mg total) by mouth daily., Disp: 90 tablet, Rfl: 1   labetalol (NORMODYNE) 200 MG tablet, Take 1 tablet (200 mg total) by mouth 2 (two) times daily., Disp: 180 tablet, Rfl: 0   levothyroxine (SYNTHROID) 75 MCG tablet, Take 1 tablet (75 mcg total) by mouth daily before breakfast., Disp: 90 tablet, Rfl: 1  Allergies  Allergen Reactions   Ace Inhibitors Cough   Ciprofloxacin  Other (See Comments)    unknown   Citalopram     hyponatremia   Hydrocodone-Acetaminophen Other (See Comments)    unknown   Norvasc [Amlodipine Besylate]     Pruritus    Pantoprazole Sodium Other (See Comments)    Cramps   Pravastatin Other (See Comments)    Stomach cramps   Sulfa Antibiotics     unknown   Clonidine Other (See Comments)    Per patient told by allergist that would be best to stop but nephrologist recommended her to stay on medication     I personally reviewed active problem list, medication list, allergies, family history with the patient/caregiver today.   ROS  Ten systems reviewed and is negative except as mentioned in HPI    Objective  Vitals:   07/08/23 1320 07/08/23 1342  BP: (!) 170/90 (!) 166/88  Pulse: 65   Resp: 18   Temp: 97.7 F (36.5 C)   TempSrc: Oral   SpO2: 98%   Weight: 171 lb 6.4 oz (77.7 kg)   Height: 5\' 4"  (1.626 m)     Body mass index is 29.42 kg/m.  Physical Exam  Constitutional: Patient appears well-developed and well-nourished.  No distress.  HEENT: head atraumatic, normocephalic, pupils equal and reactive to light, neck supple Cardiovascular: Normal rate, regular rhythm and normal heart sounds.  No murmur heard. No BLE edema. Pulmonary/Chest: Effort normal and breath sounds normal. No respiratory distress. Abdominal: Soft.  There is no tenderness. Muscular skeletal: using a walker, negative straight leg raise Psychiatric: Patient has a normal mood and affect. behavior is normal. Judgment and thought content normal.   Recent Results (from the past 2160 hour(s))  CBC with Differential     Status: Abnormal   Collection Time: 06/25/23  6:58 AM  Result Value Ref Range   WBC 7.2 4.0 - 10.5 K/uL   RBC 2.62 (L) 3.87 - 5.11 MIL/uL   Hemoglobin 8.1 (L) 12.0 - 15.0 g/dL   HCT 19.1 (L) 47.8 - 29.5 %   MCV 94.3 80.0 - 100.0 fL   MCH 30.9 26.0 - 34.0 pg   MCHC 32.8 30.0 - 36.0 g/dL   RDW 13.3  11.5 - 15.5 %   Platelets 205 150  - 400 K/uL   nRBC 0.0 0.0 - 0.2 %   Neutrophils Relative % 61 %   Neutro Abs 4.4 1.7 - 7.7 K/uL   Lymphocytes Relative 19 %   Lymphs Abs 1.4 0.7 - 4.0 K/uL   Monocytes Relative 8 %   Monocytes Absolute 0.6 0.1 - 1.0 K/uL   Eosinophils Relative 11 %   Eosinophils Absolute 0.8 (H) 0.0 - 0.5 K/uL   Basophils Relative 1 %   Basophils Absolute 0.1 0.0 - 0.1 K/uL   Immature Granulocytes 0 %   Abs Immature Granulocytes 0.02 0.00 - 0.07 K/uL    Comment: Performed at [facility], [address], [place], [place] [address]  Comprehensive metabolic panel     Status: Abnormal   Collection Time: 06/25/23  6:58 AM  Result Value Ref Range   Sodium 135 135 - 145 mmol/L   Potassium 4.3 3.5 - 5.1 mmol/L   Chloride 109 98 - 111 mmol/L   CO2 17 (L) 22 - 32 mmol/L   Glucose, Bld 99 70 - 99 mg/dL    Comment: Glucose reference range applies only to samples taken after fasting for at least 8 hours.   BUN 50 (H) 8 - 23 mg/dL   Creatinine, Ser 6.29 (H) 0.44 - 1.00 mg/dL   Calcium 8.9 8.9 - 52.8 mg/dL   Total Protein 6.7 6.5 - 8.1 g/dL   Albumin 3.6 3.5 - 5.0 g/dL   AST 23 15 - 41 U/L   ALT 12 0 - 44 U/L   Alkaline Phosphatase 67 38 - 126 U/L   Total Bilirubin 0.8 0.3 - 1.2 mg/dL   GFR, Estimated 13 (L) >60 mL/min    Comment: (NOTE) Calculated using the CKD-EPI Creatinine Equation (2021)    Anion gap 9 5 - 15    Comment: Performed at [facility], [address] [place], [place] [address]  Urinalysis, Routine w reflex microscopic -Urine, Clean Catch     Status: Abnormal   Collection Time: 06/25/23  6:58 AM  Result Value Ref Range   Color, Urine STRAW (A) YELLOW   APPearance CLEAR (A) CLEAR   Specific Gravity, Urine 1.009 1.005 - 1.030   pH 5.0 5.0 - 8.0   Glucose, UA NEGATIVE NEGATIVE mg/dL   Hgb urine dipstick MODERATE (A) NEGATIVE   Bilirubin Urine NEGATIVE NEGATIVE   Ketones, ur NEGATIVE NEGATIVE mg/dL   Protein, ur 401 (A) NEGATIVE mg/dL   Nitrite NEGATIVE NEGATIVE    Leukocytes,Ua NEGATIVE NEGATIVE   RBC / HPF 21-50 0 - 5 RBC/hpf   WBC, UA 0-5 0 - 5 WBC/hpf   Bacteria, UA NONE SEEN NONE SEEN   Squamous Epithelial / HPF 0 0 - 5 /HPF    Comment: Performed at [facility], [address] [place], [place] [address]  Lactic acid, plasma     Status: None   Collection Time: 06/25/23  8:07 AM  Result Value Ref Range   Lactic Acid, Venous 0.9 0.5 - 1.9 mmol/L    Comment: Performed at [facility], [address] [place], [place] [address]  Basic metabolic panel     Status: Abnormal   Collection Time: 06/26/23  4:31 AM  Result Value Ref Range   Sodium 135 135 - 145 mmol/L   Potassium 5.2 (H) 3.5 - 5.1 mmol/L   Chloride 108 98 - 111 mmol/L   CO2 16 (L) 22 - 32  mmol/L   Glucose, Bld 87 70 - 99 mg/dL    Comment: Glucose reference range applies only to samples taken after fasting for at least 8 hours.   BUN 47 (H) 8 - 23 mg/dL   Creatinine, Ser 0.10 (H) 0.44 - 1.00 mg/dL   Calcium 8.9 8.9 - 27.2 mg/dL   GFR, Estimated 12 (L) >60 mL/min    Comment: (NOTE) Calculated using the CKD-EPI Creatinine Equation (2021)    Anion gap 11 5 - 15    Comment: Performed at [facility], [address] [place], [place] [address]  CBC     Status: Abnormal   Collection Time: 06/26/23  4:31 AM  Result Value Ref Range   WBC 7.1 4.0 - 10.5 K/uL   RBC 2.78 (L) 3.87 - 5.11 MIL/uL   Hemoglobin 8.6 (L) 12.0 - 15.0 g/dL   HCT 40.3 (L) 47.4 - 25.9 %   MCV 92.8 80.0 - 100.0 fL   MCH 30.9 26.0 - 34.0 pg   MCHC 33.3 30.0 - 36.0 g/dL   RDW 56.3 87.5 - 64.3 %   Platelets 220 150 - 400 K/uL   nRBC 0.0 0.0 - 0.2 %    Comment: Performed at [facility], [address] [place], [place] [address]  Renal function panel     Status: Abnormal   Collection Time: 06/27/23  4:58 AM  Result Value Ref Range   Sodium 133 (L) 135 - 145 mmol/L   Potassium 5.1 3.5 - 5.1 mmol/L   Chloride 105 98 - 111 mmol/L   CO2 18 (L) 22 - 32 mmol/L   Glucose,  Bld 99 70 - 99 mg/dL    Comment: Glucose reference range applies only to samples taken after fasting for at least 8 hours.   BUN 63 (H) 8 - 23 mg/dL   Creatinine, Ser 8.84 (H) 0.44 - 1.00 mg/dL   Calcium 8.6 (L) 8.9 - 10.3 mg/dL   Phosphorus 5.6 (H) 2.5 - 4.6 mg/dL   Albumin 3.5 3.5 - 5.0 g/dL   GFR, Estimated 10 (L) >60 mL/min    Comment: (NOTE) Calculated using the CKD-EPI Creatinine Equation (2021)    Anion gap 10 5 - 15    Comment: Performed at [facility], [address] [place], [place] [address]  Glucose, capillary     Status: Abnormal   Collection Time: 06/27/23  9:16 PM  Result Value Ref Range   Glucose-Capillary 105 (H) 70 - 99 mg/dL    Comment: Glucose reference range applies only to samples taken after fasting for at least 8 hours.  CBC     Status: Abnormal   Collection Time: 06/28/23  4:49 AM  Result Value Ref Range   WBC 6.3 4.0 - 10.5 K/uL   RBC 2.62 (L) 3.87 - 5.11 MIL/uL   Hemoglobin 7.9 (L) 12.0 - 15.0 g/dL   HCT 30.1 (L) 60.1 - 09.3 %   MCV 93.9 80.0 - 100.0 fL   MCH 30.2 26.0 - 34.0 pg   MCHC 32.1 30.0 - 36.0 g/dL   RDW 23.5 57.3 - 22.0 %   Platelets 180 150 - 400 K/uL   nRBC 0.0 0.0 - 0.2 %    Comment: Performed at [facility], [address], [place], [place] [address]  Basic metabolic panel     Status: Abnormal   Collection Time: 06/28/23  4:49 AM  Result Value Ref Range   Sodium 131 (L) 135 - 145 mmol/L  Potassium 4.8 3.5 - 5.1 mmol/L   Chloride 105 98 - 111 mmol/L   CO2 19 (L) 22 - 32 mmol/L   Glucose, Bld 99 70 - 99 mg/dL    Comment: Glucose reference range applies only to samples taken after fasting for at least 8 hours.   BUN 77 (H) 8 - 23 mg/dL   Creatinine, Ser 4.09 (H) 0.44 - 1.00 mg/dL   Calcium 8.3 (L) 8.9 - 10.3 mg/dL   GFR, Estimated 8 (L) >60 mL/min    Comment: (NOTE) Calculated using the CKD-EPI Creatinine Equation (2021)    Anion gap 7 5 - 15    Comment: Performed at [facility], [address] [place], [place] [address]  Glucose, capillary     Status: None   Collection Time: 06/28/23  5:07 AM  Result Value Ref Range   Glucose-Capillary 82 70 - 99 mg/dL    Comment: Glucose reference range applies only to samples taken after fasting for at least 8 hours.  CBC     Status: Abnormal   Collection Time: 06/29/23  4:58 AM  Result Value Ref Range   WBC 6.2 4.0 - 10.5 K/uL   RBC 2.59 (L) 3.87 - 5.11 MIL/uL   Hemoglobin 8.0 (L) 12.0 - 15.0 g/dL   HCT 47.8 (L) 29.5 - 62.1 %   MCV 91.5 80.0 - 100.0 fL   MCH 30.9 26.0 - 34.0 pg   MCHC 33.8 30.0 - 36.0 g/dL   RDW 30.8 65.7 - 84.6 %   Platelets 176 150 - 400 K/uL   nRBC 0.0 0.0 - 0.2 %    Comment: Performed at [facility], [address] [place], [place] [address]  Basic metabolic panel     Status: Abnormal   Collection Time: 06/29/23  4:58 AM  Result Value Ref Range   Sodium 130 (L) 135 - 145 mmol/L   Potassium 4.7 3.5 - 5.1 mmol/L   Chloride 101 98 - 111 mmol/L   CO2 20 (L) 22 - 32 mmol/L   Glucose, Bld 99 70 - 99 mg/dL    Comment: Glucose reference range applies only to samples taken after fasting for at least 8 hours.   BUN 85 (H) 8 - 23 mg/dL   Creatinine, Ser 2.84 (H) 0.44 - 1.00 mg/dL   Calcium 8.5 (L) 8.9 - 10.3 mg/dL   GFR, Estimated 8 (L) >60 mL/min    Comment: (NOTE) Calculated using the CKD-EPI Creatinine Equation (2021)    Anion gap 9 5 - 15    Comment: Performed at [facility], [address] [place], [place] [address]    PHQ2/9:    07/08/2023    1:22 PM 01/05/2023    1:15 PM 08/25/2022    9:04 AM 07/06/2022    2:02 PM 01/04/2022    2:13 PM  Depression screen PHQ 2/9  Decreased Interest 1 0 0 0 0  Down, Depressed, Hopeless 1 0 0 0 0  PHQ - 2 Score 2 0 0 0 0  Altered sleeping 0 0 0 0 0  Tired, decreased energy 1 0 0 0 0  Change in appetite 0 0 0 0 0  Feeling bad or failure about yourself  0 0 0 0 0  Trouble concentrating 0 0 0 0 0  Moving slowly or fidgety/restless 0 0 0 0 0   Suicidal thoughts 0 0 0 0 0  PHQ-9 Score 3 0 0 0 0  Difficult doing work/chores Not  difficult at all  Not difficult at all Not difficult at all     phq 9 is negative  Fall Risk:    07/08/2023    1:22 PM 01/05/2023    1:15 PM 08/25/2022    9:03 AM 07/06/2022    2:01 PM 01/04/2022    2:17 PM  Fall Risk   Falls in the past year? 1 1 0 0 0  Number falls in past yr: 1 0 0 0 0  Injury with Fall? 1 1 0 0 0  Risk for fall due to : History of fall(s);Impaired balance/gait Impaired balance/gait No Fall Risks No Fall Risks Impaired balance/gait  Follow up Falls prevention discussed;Education provided;Falls evaluation completed Falls prevention discussed Falls prevention discussed;Education provided;Falls evaluation completed Falls prevention discussed;Education provided Falls prevention discussed     Assessment & Plan  1. Hospital discharge follow-up  Increase labetolol to 200 mg BID and referral to pain clinic   2. CKD (chronic kidney disease), stage V (HCC)  Number on her clinical summary so she can schedule a sooner visit   3. Angina pectoris (HCC)  - Lipid panel  4. 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HCC)  - Lipid panel - COMPLETE METABOLIC PANEL WITH GFR - atorvastatin (LIPITOR) 40 MG tablet; TAKE 1 TABLET BY MOUTH ONCE DAILY AT  6PM.  CHOLESTEROL  MEDICATION.  Dispense: 90 tablet; Refill: 1  5. Uncontrolled hypertension  - labetalol (NORMODYNE) 200 MG tablet; Take 1 tablet (200 mg total) by mouth 2 (two) times daily.  Dispense: 180 tablet; Refill: 0 - cloNIDine (CATAPRES) 0.2 MG tablet; Take 1 tablet (0.2 mg total) by mouth 2 (two) times daily.  Dispense: 180 tablet; Refill: 1  6. Secondary hyperparathyroidism of renal origin (HCC)  Monitored by nephrologist   7. Hypothyroidism, postsurgical  - TSH - levothyroxine (SYNTHROID) 75 MCG tablet; Take 1 tablet (75 mcg total) by mouth daily before breakfast.  Dispense: 90 tablet; Refill: 1  8. History of lacunar  cerebrovascular accident (CVA)  - Lipid panel - atorvastatin (LIPITOR) 40 MG tablet; TAKE 1 TABLET BY MOUTH ONCE DAILY AT  6PM.  CHOLESTEROL  MEDICATION.  Dispense: 90 tablet; Refill: 1  9. Spinal stenosis of lumbar region without neurogenic claudication  - Ambulatory referral to Pain Clinic  10. History of stent insertion of renal artery  - ezetimibe (ZETIA) 10 MG tablet; Take 1 tablet (10 mg total) by mouth daily.  Dispense: 90 tablet; Refill: 1

## 2023-07-08 ENCOUNTER — Ambulatory Visit (INDEPENDENT_AMBULATORY_CARE_PROVIDER_SITE_OTHER): Payer: Medicare Other | Admitting: Family Medicine

## 2023-07-08 ENCOUNTER — Encounter: Payer: Self-pay | Admitting: Family Medicine

## 2023-07-08 VITALS — BP 166/88 | HR 65 | Temp 97.7°F | Resp 18 | Ht 64.0 in | Wt 171.4 lb

## 2023-07-08 DIAGNOSIS — E890 Postprocedural hypothyroidism: Secondary | ICD-10-CM

## 2023-07-08 DIAGNOSIS — Z09 Encounter for follow-up examination after completed treatment for conditions other than malignant neoplasm: Secondary | ICD-10-CM

## 2023-07-08 DIAGNOSIS — I700 Atherosclerosis of aorta: Secondary | ICD-10-CM

## 2023-07-08 DIAGNOSIS — I10 Essential (primary) hypertension: Secondary | ICD-10-CM

## 2023-07-08 DIAGNOSIS — I160 Hypertensive urgency: Secondary | ICD-10-CM | POA: Diagnosis not present

## 2023-07-08 DIAGNOSIS — M48061 Spinal stenosis, lumbar region without neurogenic claudication: Secondary | ICD-10-CM

## 2023-07-08 DIAGNOSIS — I209 Angina pectoris, unspecified: Secondary | ICD-10-CM

## 2023-07-08 DIAGNOSIS — N2581 Secondary hyperparathyroidism of renal origin: Secondary | ICD-10-CM

## 2023-07-08 DIAGNOSIS — Z9889 Other specified postprocedural states: Secondary | ICD-10-CM

## 2023-07-08 DIAGNOSIS — N185 Chronic kidney disease, stage 5: Secondary | ICD-10-CM | POA: Diagnosis not present

## 2023-07-08 DIAGNOSIS — Z8673 Personal history of transient ischemic attack (TIA), and cerebral infarction without residual deficits: Secondary | ICD-10-CM

## 2023-07-08 DIAGNOSIS — Z23 Encounter for immunization: Secondary | ICD-10-CM

## 2023-07-08 MED ORDER — LEVOTHYROXINE SODIUM 75 MCG PO TABS
75.0000 ug | ORAL_TABLET | Freq: Every day | ORAL | 1 refills | Status: DC
Start: 2023-07-08 — End: 2024-01-17

## 2023-07-08 MED ORDER — CLONIDINE HCL 0.2 MG PO TABS
0.2000 mg | ORAL_TABLET | Freq: Two times a day (BID) | ORAL | 1 refills | Status: DC
Start: 2023-07-08 — End: 2023-07-15

## 2023-07-08 MED ORDER — LABETALOL HCL 200 MG PO TABS
200.0000 mg | ORAL_TABLET | Freq: Two times a day (BID) | ORAL | 0 refills | Status: DC
Start: 2023-07-08 — End: 2023-07-15

## 2023-07-08 MED ORDER — EZETIMIBE 10 MG PO TABS
10.0000 mg | ORAL_TABLET | Freq: Every day | ORAL | 1 refills | Status: DC
Start: 2023-07-08 — End: 2023-09-06

## 2023-07-08 MED ORDER — ATORVASTATIN CALCIUM 40 MG PO TABS
ORAL_TABLET | ORAL | 1 refills | Status: DC
Start: 2023-07-08 — End: 2023-12-30

## 2023-07-08 NOTE — Patient Instructions (Addendum)
[address] [place], [place]  [address]  [place]  [place], [place] [address]  [phone]  Lamont Dowdy, MD  [address]  [place]  [place], [place] [address]  [phone] (Work)  [phone] (Fax)     Go up on Labetolol from 150 mg twice daily to 200 mg twice daily

## 2023-07-09 LAB — TSH: TSH: 4.53 m[IU]/L — ABNORMAL HIGH (ref 0.40–4.50)

## 2023-07-09 LAB — COMPLETE METABOLIC PANEL WITH GFR
AG Ratio: 1.6 (calc) (ref 1.0–2.5)
ALT: 12 U/L (ref 6–29)
AST: 18 U/L (ref 10–35)
Albumin: 3.6 g/dL (ref 3.6–5.1)
Alkaline phosphatase (APISO): 68 U/L (ref 37–153)
BUN/Creatinine Ratio: 15 (calc) (ref 6–22)
BUN: 61 mg/dL — ABNORMAL HIGH (ref 7–25)
CO2: 20 mmol/L (ref 20–32)
Calcium: 8.9 mg/dL (ref 8.6–10.4)
Chloride: 104 mmol/L (ref 98–110)
Creat: 4.14 mg/dL — ABNORMAL HIGH (ref 0.60–1.00)
Globulin: 2.3 g/dL (ref 1.9–3.7)
Glucose, Bld: 100 mg/dL — ABNORMAL HIGH (ref 65–99)
Potassium: 5 mmol/L (ref 3.5–5.3)
Sodium: 132 mmol/L — ABNORMAL LOW (ref 135–146)
Total Bilirubin: 0.4 mg/dL (ref 0.2–1.2)
Total Protein: 5.9 g/dL — ABNORMAL LOW (ref 6.1–8.1)
eGFR: 11 mL/min/{1.73_m2} — ABNORMAL LOW (ref >=60)

## 2023-07-09 LAB — LIPID PANEL
Cholesterol: 176 mg/dL (ref <200)
HDL: 57 mg/dL (ref >=50)
LDL Cholesterol (Calc): 101 mg/dL — ABNORMAL HIGH
Non-HDL Cholesterol (Calc): 119 mg/dL (ref <130)
Total CHOL/HDL Ratio: 3.1 (calc) (ref <5.0)
Triglycerides: 89 mg/dL (ref <150)

## 2023-07-15 ENCOUNTER — Ambulatory Visit: Payer: Medicare Other

## 2023-07-15 ENCOUNTER — Other Ambulatory Visit: Payer: Self-pay | Admitting: Family Medicine

## 2023-07-15 ENCOUNTER — Telehealth: Payer: Self-pay

## 2023-07-15 DIAGNOSIS — I10 Essential (primary) hypertension: Secondary | ICD-10-CM

## 2023-07-15 MED ORDER — LABETALOL HCL 300 MG PO TABS
300.0000 mg | ORAL_TABLET | Freq: Two times a day (BID) | ORAL | 0 refills | Status: DC
Start: 2023-07-15 — End: 2023-11-21

## 2023-07-15 MED ORDER — LABETALOL HCL 100 MG PO TABS
150.0000 mg | ORAL_TABLET | Freq: Two times a day (BID) | ORAL | 0 refills | Status: DC
Start: 2023-07-15 — End: 2023-07-15

## 2023-07-15 NOTE — Telephone Encounter (Signed)
Filled out on desk to sign, and mail when finnished

## 2023-07-15 NOTE — Progress Notes (Signed)
Pt came in for bp check.  Blood pressure today was 126/82 and shannon got 130/82.  Per patient she is taking labetolol BID and a half for a total of 300mg  bid.  She is also taking clonidine 0.2mg  bid.

## 2023-07-15 NOTE — Telephone Encounter (Signed)
Pt wants to see if you will do a handicap placard

## 2023-07-18 ENCOUNTER — Telehealth: Payer: Self-pay | Admitting: Family Medicine

## 2023-07-18 NOTE — Telephone Encounter (Signed)
Copied from CRM [id number]. Topic: General - Other >> Jul 18, 2023  9:47 AM Franchot Heidelberg wrote: Reason for CRM: Pharmacy called needing clarificaiton on the patient's labetol, ask for [facility].  [facility] [address]), [place] - [address]) [place] [address] Phone: [phone] Fax: [phone]

## 2023-07-18 NOTE — Telephone Encounter (Signed)
Patient called in very upset over this mix up over her medication, Labetalol. She states she is supposed to take 1 Labetalol twice a day, but when she went to Hospital, they put her on 1 and a half pills in the morning and at night too. The pharmacy called her PCP, Patient states her pharmacy will not give her any medication until PCP calls and fixes this mix up. Please follow back up with patient @ # [phone]

## 2023-07-18 NOTE — Telephone Encounter (Signed)
Medicine for tablet BID sent in

## 2023-08-16 DIAGNOSIS — R937 Abnormal findings on diagnostic imaging of other parts of musculoskeletal system: Secondary | ICD-10-CM | POA: Insufficient documentation

## 2023-08-16 DIAGNOSIS — Z789 Other specified health status: Secondary | ICD-10-CM | POA: Insufficient documentation

## 2023-08-16 DIAGNOSIS — G894 Chronic pain syndrome: Secondary | ICD-10-CM | POA: Insufficient documentation

## 2023-08-16 DIAGNOSIS — M899 Disorder of bone, unspecified: Secondary | ICD-10-CM | POA: Insufficient documentation

## 2023-08-16 DIAGNOSIS — Z79899 Other long term (current) drug therapy: Secondary | ICD-10-CM | POA: Insufficient documentation

## 2023-08-16 NOTE — Progress Notes (Unsigned)
Patient: Betty Nielsen  Service Category: E/M  Provider: Oswaldo Done, MD  DOB: March 07, 1946  DOS: 08/17/2023  Referring Provider: Alba Cory, MD  MRN: [id number]  Setting: Ambulatory outpatient  PCP: Alba Cory, MD  Type: New Patient  Specialty: Interventional Pain Management    Location: Office  Delivery: Face-to-face     Primary Reason(s) for Nielsen: Encounter for initial evaluation of one or more chronic problems (new to examiner) potentially causing chronic pain, and posing a threat to normal musculoskeletal function. (Level of risk: High) CC: No chief complaint on file.  HPI  Betty Nielsen is a 77 y.o. year old, female patient, who comes for the first time to our practice referred by Alba Cory, MD for our initial evaluation of her chronic pain. She has AAA (abdominal aortic aneurysm) without rupture (HCC); Acid reflux; Benign secondary hypertension due to renal artery stenosis (HCC); Arthritis, degenerative; Hypothyroidism, postsurgical; Major depression in remission (HCC); Obesity (BMI 30.0-34.9); Neuropathy; Perennial allergic rhinitis with seasonal variation; 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HCC); BPV (benign positional vertigo); Headache; Hyperlipidemia; History of non-ST elevation myocardial infarction (NSTEMI); History of coronary angioplasty; History of stroke; Anemia of chronic disease; Hypo-osmolality and hyponatremia; Secondary hyperparathyroidism of renal origin (HCC); Left upper extremity numbness; Left facial numbness; Elevated serum creatinine; Accelerated hypertension; Hyperkalemia; Renal artery stenosis (HCC); Prediabetes; Hypertension; Hypertensive urgency; Metabolic acidosis; Inadequate pain control; Spinal stenosis; Benign hypertensive kidney disease with chronic kidney disease; Proteinuria; Chronic kidney disease (CKD), stage V (HCC); Abnormal MRI, lumbar spine (06/25/2023); Chronic pain syndrome; Pharmacologic therapy; Disorder of skeletal system; and Problems  influencing health status on their problem list. Today she comes in for evaluation of her No chief complaint on file.  Pain Assessment: Location:     Radiating:   Onset:   Duration:   Quality:   Severity:  /10 (subjective, self-reported pain score)  Effect on ADL:   Timing:   Modifying factors:   BP:    HR:    Onset and Duration: {Hx; Onset and Duration:210120511} Cause of pain: {Hx; Cause:210120521} Severity: {Pain Severity:210120502} Timing: {Symptoms; Timing:210120501} Aggravating Factors: {Causes; Aggravating pain factors:210120507} Alleviating Factors: {Causes; Alleviating Factors:210120500} Associated Problems: {Hx; Associated problems:210120515} Quality of Pain: {Hx; Symptom quality or Descriptor:210120531} Previous Examinations or Tests: {Hx; Previous examinations or test:210120529} Previous Treatments: {Hx; Previous Treatment:210120503}  Betty Nielsen is being evaluated for possible interventional pain management therapies for the treatment of her chronic pain.  Discussed the use of AI scribe software for clinical note transcription with the patient, who gave verbal consent to proceed.  History of Present Illness           *** Betty Nielsen has been informed that this initial Nielsen was an evaluation only.  On the follow up appointment I will go over the results, including ordered tests and available interventional therapies. At that time she will have the opportunity to decide whether to proceed with offered therapies or not. In the event that Betty Nielsen prefers avoiding interventional options, this will conclude our involvement in the case.  Medication management recommendations may be provided upon request.  Patient informed that diagnostic tests may be ordered to assist in identifying underlying causes, narrow the list of differential diagnoses and aid in determining candidacy for (or contraindications to) planned therapeutic interventions.  Historic Controlled Substance  Pharmacotherapy Review  PMP and historical list of controlled substances: None seen on PMP. Most recently prescribed opioid analgesics:   None MME/day: 0 mg/day  Historical Monitoring: The patient  reports no history  of drug use. List of prior UDS Testing: No results found for: "MDMA", "COCAINSCRNUR", "PCPSCRNUR", "PCPQUANT", "CANNABQUANT", "THCU", "ETH", "CBDTHCR", "D8THCCBX", "D9THCCBX" Historical Background Evaluation: [place] PMP: PDMP reviewed during this encounter. Review of the past 31-months conducted.             PMP NARX Score Report:  Narcotic: 000 Sedative: 000 Stimulant: 000 [place] Department of public safety, offender search: Engineer, mining Information) Non-contributory Risk Assessment Profile: Aberrant behavior: None observed or detected today Risk factors for fatal opioid overdose: None identified today PMP NARX Overdose Risk Score: 000 Fatal overdose hazard ratio (HR): Calculation deferred Non-fatal overdose hazard ratio (HR): Calculation deferred Risk of opioid abuse or dependence: 0.7-3.0% with doses <= 36 MME/day and 6.1-26% with doses >= 120 MME/day. Substance use disorder (SUD) risk level: See below Personal History of Substance Abuse (SUD-Substance use disorder):  Alcohol:    Illegal Drugs:    Rx Drugs:    ORT Risk Level calculation:    ORT Scoring interpretation table:  Score <3 = Low Risk for SUD  Score between 4-7 = Moderate Risk for SUD  Score >8 = High Risk for Opioid Abuse   PHQ-2 Depression Scale:  Total score:    PHQ-2 Scoring interpretation table: (Score and probability of major depressive disorder)  Score 0 = No depression  Score 1 = 15.4% Probability  Score 2 = 21.1% Probability  Score 3 = 38.4% Probability  Score 4 = 45.5% Probability  Score 5 = 56.4% Probability  Score 6 = 78.6% Probability   PHQ-9 Depression Scale:  Total score:    PHQ-9 Scoring interpretation table:  Score 0-4 = No depression  Score 5-9 = Mild depression  Score 10-14 = Moderate  depression  Score 15-19 = Moderately severe depression  Score 20-27 = Severe depression (2.4 times higher risk of SUD and 2.89 times higher risk of overuse)   Pharmacologic Plan: As per protocol, I have not taken over any controlled substance management, pending the results of ordered tests and/or consults.            Initial impression: Pending review of available data and ordered tests.  Meds   Current Outpatient Medications:    acetaminophen (TYLENOL) 650 MG CR tablet, Take 1 tablet (650 mg total) by mouth every 8 (eight) hours as needed for pain., Disp: 20 tablet, Rfl: 1   aspirin EC 81 MG tablet, Take 81 mg by mouth daily. Swallow whole., Disp: , Rfl:    atorvastatin (LIPITOR) 40 MG tablet, TAKE 1 TABLET BY MOUTH ONCE DAILY AT  6PM.  CHOLESTEROL  MEDICATION., Disp: 90 tablet, Rfl: 1   calcitRIOL (ROCALTROL) 0.25 MCG capsule, Take 0.25 mcg by mouth daily., Disp: , Rfl:    Cholecalciferol 125 MCG (5000 UT) TABS, Take 5,000 Units by mouth daily., Disp: , Rfl:    clopidogrel (PLAVIX) 75 MG tablet, TAKE 1 TABLET BY MOUTH ONCE DAILY .  BLOOD  THINNER  TO  HELP  REDUCE  STROKE  RISK, Disp: 90 tablet, Rfl: 1   ezetimibe (ZETIA) 10 MG tablet, Take 1 tablet (10 mg total) by mouth daily., Disp: 90 tablet, Rfl: 1   labetalol (NORMODYNE) 300 MG tablet, Take 1 tablet (300 mg total) by mouth 2 (two) times daily., Disp: 180 tablet, Rfl: 0   latanoprost (XALATAN) 0.005 % ophthalmic solution, Place 1 drop into both eyes at bedtime., Disp: , Rfl:    levothyroxine (SYNTHROID) 75 MCG tablet, Take 1 tablet (75 mcg total) by mouth daily before breakfast.,  Disp: 90 tablet, Rfl: 1   senna-docusate (SENOKOT-S) 8.6-50 MG tablet, Take 1 tablet by mouth at bedtime as needed for mild constipation., Disp: 60 tablet, Rfl: 0  Imaging Review  Lumbosacral Imaging: Lumbar MR wo contrast: Results for orders placed during the hospital encounter of 06/25/23 MR LUMBAR SPINE WO CONTRAST  Narrative CLINICAL DATA:  Low back  pain with symptoms persisting over 6 weeks of treatment.  EXAM: MRI LUMBAR SPINE WITHOUT CONTRAST  TECHNIQUE: Multiplanar, multisequence MR imaging of the lumbar spine was performed. No intravenous contrast was administered.  COMPARISON:  02/23/2023 abdominal CT  FINDINGS: Segmentation:  Standard.  Alignment:  Mild scoliosis and L3-4, L4-5 anterolisthesis.  Vertebrae: No fracture, evidence of discitis, or bone lesion. Minimal degenerative edema at the L3 superior endplate.  Conus medullaris and cauda equina: Conus extends to the L1 level. Conus and cauda equina appear normal.  Paraspinal and other soft tissues: Aneurysmal aorta affecting infra and suprarenal segments and measuring up to 4.26 cm at the hiatus, there is contemporaneous MRA of the abdomen. Polycystic kidneys.  Disc levels:  L1-L2: Small right paracentral protrusion. Moderate right subarticular recess narrowing.  L2-L3: Bulging disc with right paracentral extrusion that is downward migrating and markedly impinges on the descending right L3 nerve root. Herniation nearly reaches the L3-4 disc space. Mild to moderate spinal canal narrowing. Patent foramina  L3-L4: Disc narrowing and bulging with right foraminal protrusion. Degenerative facet spurring and ligamentum flavum thickening. Advanced spinal stenosis. Moderate right foraminal narrowing.  L4-L5: Facet degeneration with spurring and anterolisthesis. Circumferential disc bulging. Moderate spinal stenosis with effacement of both subarticular recesses.  L5-S1:Disc narrowing with endplate and facet spurring. The canal and foramina are patent.  IMPRESSION: 1. Advanced and generalized lumbar spine degeneration with mild scoliosis and multilevel listhesis. Dominant findings at L2-3 where there is a large right paracentral extrusion that migrates inferiorly and severely impinges on the right L3 nerve root. 2. Spinal stenosis at L2-3 to L4-5, advanced at  L3-4. Up to moderate foraminal narrowing on the right at L3-4.   Electronically Signed By: Tiburcio Pea M.D. On: 06/25/2023 12:33  Complexity Note: Imaging results reviewed.                         ROS  Cardiovascular: {Hx; Cardiovascular History:210120525} Pulmonary or Respiratory: {Hx; Pumonary and/or Respiratory History:210120523} Neurological: {Hx; Neurological:210120504} Psychological-Psychiatric: {Hx; Psychological-Psychiatric History:210120512} Gastrointestinal: {Hx; Gastrointestinal:210120527} Genitourinary: {Hx; Genitourinary:210120506} Hematological: {Hx; Hematological:210120510} Endocrine: {Hx; Endocrine history:210120509} Rheumatologic: {Hx; Rheumatological:210120530} Musculoskeletal: {Hx; Musculoskeletal:210120528} Work History: {Hx; Work history:210120514}  Allergies  Betty Nielsen is allergic to ace inhibitors, ciprofloxacin, citalopram, hydrocodone-acetaminophen, norvasc [amlodipine besylate], pantoprazole sodium, pravastatin, sulfa antibiotics, and clonidine.  Laboratory Chemistry Profile   Renal Lab Results  Component Value Date   BUN 61 (H) 07/08/2023   CREATININE 4.14 (H) 07/08/2023   LABCREA 29 06/07/2021   BCR 15 07/08/2023   GFRAA 31 (L) 06/18/2020   GFRNONAA 8 (L) 06/29/2023   PROTEINUR 100 (A) 06/25/2023     Electrolytes Lab Results  Component Value Date   NA 132 (L) 07/08/2023   K 5.0 07/08/2023   CL 104 07/08/2023   CALCIUM 8.9 07/08/2023   MG 2.1 06/10/2021   PHOS 5.6 (H) 06/27/2023     Hepatic Lab Results  Component Value Date   AST 18 07/08/2023   ALT 12 07/08/2023   ALBUMIN 3.5 06/27/2023   ALKPHOS 67 06/25/2023   AMYLASE 339 (H) 04/19/2008   LIPASE 28 08/21/2018  ID Lab Results  Component Value Date   SARSCOV2NAA NEGATIVE 06/06/2021     Bone Lab Results  Component Value Date   VD25OH 79 06/18/2020     Endocrine Lab Results  Component Value Date   GLUCOSE 100 (H) 07/08/2023   GLUCOSEU NEGATIVE 06/25/2023    HGBA1C 5.5 07/06/2022   TSH 4.53 (H) 07/08/2023     Neuropathy Lab Results  Component Value Date   VITAMINB12 634 07/06/2021   FOLATE 25.0 07/06/2021   HGBA1C 5.5 07/06/2022     CNS No results found for: "COLORCSF", "APPEARCSF", "RBCCOUNTCSF", "WBCCSF", "POLYSCSF", "LYMPHSCSF", "EOSCSF", "PROTEINCSF", "GLUCCSF", "JCVIRUS", "CSFOLI", "IGGCSF", "LABACHR", "ACETBL"   Inflammation (CRP: Acute  ESR: Chronic) Lab Results  Component Value Date   LATICACIDVEN 0.9 06/25/2023     Rheumatology No results found for: "RF", "ANA", "LABURIC", "URICUR", "LYMEIGGIGMAB", "LYMEABIGMQN", "HLAB27"   Coagulation Lab Results  Component Value Date   INR 1.0 06/06/2021   LABPROT 13.2 06/06/2021   APTT 29 06/06/2021   PLT 176 06/29/2023     Cardiovascular Lab Results  Component Value Date   BNP 156.9 (H) 06/06/2021   CKTOTAL 112 04/05/2012   CKMB 0.5 04/05/2012   TROPONINI <0.03 08/29/2018   HGB 8.0 (L) 06/29/2023   HCT 23.7 (L) 06/29/2023   EPIRU [facility] 06/09/2021   NOREPRU NOT PERFORMED 06/09/2021   DOPARU NOT PERFORMED 06/09/2021     Screening Lab Results  Component Value Date   SARSCOV2NAA NEGATIVE 06/06/2021     Cancer No results found for: "CEA", "CA125", "LABCA2"   Allergens No results found for: "ALMOND", "APPLE", "ASPARAGUS", "AVOCADO", "BANANA", "BARLEY", "BASIL", "BAYLEAF", "GREENBEAN", "LIMABEAN", "WHITEBEAN", "BEEFIGE", "REDBEET", "BLUEBERRY", "BROCCOLI", "CABBAGE", "MELON", "CARROT", "CASEIN", "CASHEWNUT", "CAULIFLOWER", "CELERY"     Note: Lab results reviewed.  PFSH  Drug: Betty Nielsen  reports no history of drug use. Alcohol:  reports no history of alcohol use. Tobacco:  reports that she quit smoking about 24 years ago. Her smoking use included cigarettes. She started smoking about 44 years ago. She has a 10 pack-year smoking history. She has never used smokeless tobacco. Medical:  has a past medical history of Arthrosis of knee, Chronic kidney disease,  Hyperlipidemia, Hypertension, Loss of hearing, Metabolic syndrome, Plantar fasciitis, and Renal insufficiency. Family: family history includes Cerebral aneurysm in her father; Healthy in her mother.  Past Surgical History:  Procedure Laterality Date   ABDOMINAL AORTIC ANEURYSM REPAIR     ABDOMINAL HYSTERECTOMY     APPENDECTOMY     CORONARY STENT INTERVENTION N/A 10/26/2016   Procedure: Coronary Stent Intervention;  Surgeon: Alwyn Pea, MD;  Location: [facility] IN[facility]IVE [facility];  Service: Cardiovascular;  Laterality: N/A;   HERNIA REPAIR     INNER EAR SURGERY     pt not sure of type   LEFT HEART CATH AND CORONARY ANGIOGRAPHY N/A 10/26/2016   Procedure: Left Heart Cath and Coronary Angiography;  Surgeon: Dalia Heading, MD;  Location: [facility] CV [facility];  Service: Cardiovascular;  Laterality: N/A;   RENAL ANGIOGRAPHY N/A 06/11/2021   Procedure: RENAL ANGIOGRAPHY;  Surgeon: Annice Needy, MD;  Location: [facility] CV [facility];  Service: Cardiovascular;  Laterality: N/A;   THYROIDECTOMY     Active Ambulatory Problems    Diagnosis Date Noted   AAA (abdominal aortic aneurysm) without rupture (HCC) 06/26/2014   Acid reflux 06/05/2015   Benign secondary hypertension due to renal artery stenosis (HCC) 06/05/2015   Arthritis, degenerative 07/03/2014   Hypothyroidism, 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   Major depression in  remission (HCC) 06/05/2015   Obesity (BMI 30.0-34.9) 06/05/2015   Neuropathy 06/05/2015   Perennial allergic rhinitis with seasonal variation 06/05/2015   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HCC) 11/21/2015   BPV (benign positional vertigo) 06/15/2016   Headache 10/27/2016   Hyperlipidemia 10/27/2016   History of non-ST elevation myocardial infarction (NSTEMI) 10/25/2016   History of coronary angioplasty 10/25/2016   History of stroke 10/27/2019   Anemia of chronic disease 11/21/2019   Hypo-osmolality and hyponatremia 11/21/2019   Secondary hyperparathyroidism of renal origin (HCC)  11/21/2019   Left upper extremity numbness 06/07/2021   Left facial numbness 06/07/2021   Elevated serum creatinine 06/07/2021   Accelerated hypertension 06/09/2021   Hyperkalemia    Renal artery stenosis (HCC) 07/14/2021   Prediabetes 07/06/2022   Hypertension 07/06/2022   Hypertensive urgency 06/25/2023   Metabolic acidosis 06/25/2023   Inadequate pain control 06/25/2023   Spinal stenosis 06/26/2023   Benign hypertensive kidney disease with chronic kidney disease 03/09/2023   Proteinuria 03/09/2023   Chronic kidney disease (CKD), stage V (HCC) 11/21/2019   Abnormal MRI, lumbar spine (06/25/2023) 08/16/2023   Chronic pain syndrome 08/16/2023   Pharmacologic therapy 08/16/2023   Disorder of skeletal system 08/16/2023   Problems influencing health status 08/16/2023   Resolved Ambulatory Problems    Diagnosis Date Noted   Angina pectoris (HCC) 10/25/2016   Elevated troponin 10/27/2016   Essential hypertension, malignant 10/27/2016   CKD (chronic kidney disease), stage III (HCC) 10/27/2016   Hyponatremia 10/27/2016   PMR (polymyalgia rheumatica) (HCC) 06/15/2017   Malnutrition of mild degree (HCC) 06/16/2017   CVA (cerebral vascular accident) (HCC) 10/26/2019   Hypertensive urgency 10/26/2019   Stage 3b chronic kidney disease (HCC) 11/21/2019   Benign hypertension with chronic kidney disease, stage III (HCC) 06/18/2020   CVA (cerebral vascular accident) (HCC) 06/06/2021   Acute kidney injury superimposed on CKD (HCC)    Hypothyroidism    Chronic kidney disease, stage IV (severe) (HCC) 07/06/2022   Past Medical History:  Diagnosis Date   Arthrosis of knee    Chronic kidney disease    Loss of hearing    Metabolic syndrome    Plantar fasciitis    Renal insufficiency    Constitutional Exam  General appearance: Well nourished, well developed, and well hydrated. In no apparent acute distress There were no vitals filed for this Nielsen. BMI Assessment: Estimated body mass  index is 29.42 kg/m as calculated from the following:   Height as of 07/08/23: 5\' 4"  (1.626 m).   Weight as of 07/08/23: 171 lb 6.4 oz (77.7 kg).  BMI interpretation table: BMI level Category Range association with higher incidence of chronic pain  <18 kg/m2 Underweight   18.5-24.9 kg/m2 Ideal body weight   25-29.9 kg/m2 Overweight Increased incidence by 20%  30-34.9 kg/m2 Obese (Class I) Increased incidence by 68%  35-39.9 kg/m2 Severe obesity (Class II) Increased incidence by 136%  >40 kg/m2 Extreme obesity (Class III) Increased incidence by 254%   Patient's current BMI Ideal Body weight  There is no height or weight on file to calculate BMI. Patient weight not recorded   BMI Readings from Last 4 Encounters:  07/08/23 29.42 kg/m  06/28/23 29.78 kg/m  02/23/23 30.35 kg/m  01/05/23 28.67 kg/m   Wt Readings from Last 4 Encounters:  07/08/23 171 lb 6.4 oz (77.7 kg)  06/28/23 173 lb 8 oz (78.7 kg)  02/23/23 176 lb 12.9 oz (80.2 kg)  01/05/23 167 lb (75.8 kg)    Psych/Mental status: Alert,  oriented x 3 (person, place, & time)       Eyes: PERLA Respiratory: No evidence of acute respiratory distress  Assessment  Primary Diagnosis & Pertinent Problem List: The primary encounter diagnosis was Chronic pain syndrome. Diagnoses of Pharmacologic therapy, Disorder of skeletal system, and Problems influencing health status were also pertinent to this Nielsen.  Nielsen Diagnosis (New problems to examiner): 1. Chronic pain syndrome   2. Pharmacologic therapy   3. Disorder of skeletal system   4. Problems influencing health status    Plan of Care (Initial workup plan)  Note: Betty Nielsen was reminded that as per protocol, today's Nielsen has been an evaluation only. We have not taken over the patient's controlled substance management.  Problem-specific plan: Assessment and Plan            Lab Orders  No laboratory test(s) ordered today   Imaging Orders  No imaging studies ordered  today   Referral Orders  No referral(s) requested today   Procedure Orders    No procedure(s) ordered today   Pharmacotherapy (current): Medications ordered:  No orders of the defined types were placed in this encounter.  Medications administered during this Nielsen: Betty Nielsen.   Analgesic Pharmacotherapy:  Opioid Analgesics: For patients currently taking or requesting to take opioid analgesics, in accordance with [facility] Guidelines, we will assess their risks and indications for the use of these substances. After completing our evaluation, we may offer recommendations, but we no longer take patients for medication management. The prescribing physician will ultimately decide, based on his/her training and level of comfort whether to adopt any of the recommendations, including whether or not to prescribe such medicines.  Membrane stabilizer: To be determined at a later time  Muscle relaxant: To be determined at a later time  NSAID: To be determined at a later time  Other analgesic(s): To be determined at a later time   Interventional management options: Betty Nielsen was informed that there is no guarantee that she would be a candidate for interventional therapies. The decision will be based on the results of diagnostic studies, as well as Betty Nielsen's risk profile.  Procedure(s) under consideration:  Pending results of ordered studies      Interventional Therapies  Risk Factors  Considerations  Medical Comorbidities:  AAA  HTN  Stage V CKD  Hx. CVA  CAD  Hx. CABG  Hx. MI  Renal Artery Stenosis     Planned  Pending:      Under consideration:   Pending   Completed:   None at this time   Therapeutic  Palliative (PRN) options:   None established   Completed by other providers:   None reported       Provider-requested follow-up: No follow-ups on file.  Future Appointments  Date Time  Provider Department Center  08/17/2023 10:00 AM Delano Metz, MD [facility] None  09/01/2023 10:15 AM [facility]  12/30/2023  1:20 PM Alba Cory, MD [facility]    Duration of encounter: *** minutes.  Total time on encounter, as per AMA guidelines included both the face-to-face and non-face-to-face time personally spent by the physician and/or other qualified health care professional(s) on the day of the encounter (includes time in activities that require the physician or other qualified health care professional and does not include time in activities normally performed by clinical staff). Physician's time may include the following activities when performed: Preparing  to see the patient (e.g., pre-charting review of records, searching for previously ordered imaging, lab work, and nerve conduction tests) Review of prior analgesic pharmacotherapies. Reviewing PMP Interpreting ordered tests (e.g., lab work, imaging, nerve conduction tests) Performing post-procedure evaluations, including interpretation of diagnostic procedures Obtaining and/or reviewing separately obtained history Performing a medically appropriate examination and/or evaluation Counseling and educating the patient/family/caregiver Ordering medications, tests, or procedures Referring and communicating with other health care professionals (when not separately reported) Documenting clinical information in the electronic or other health record Independently interpreting results (not separately reported) and communicating results to the patient/ family/caregiver Care coordination (not separately reported)  Note by: Oswaldo Done, MD (AI and TTS technology used. I apologize for any typographical errors that were not detected and corrected.) Date: 08/17/2023; Time: 10:24 PM

## 2023-08-16 NOTE — Patient Instructions (Signed)
____________________________________________________________________________________________  New Patients  Welcome to [facility] at [facility].   Initial Visit The first or initial visit consists of an evaluation only.   Interventional pain management.  We offer therapies other than opioid controlled substances to manage chronic pain. These include, but are not limited to, diagnostic, therapeutic, and palliative specialized injection therapies (i.e.: Epidural Steroids, Facet Blocks, etc.). We specialize in a variety of nerve blocks as well as radiofrequency treatments. We offer pain implant evaluations and trials, as well as follow up management. In addition we also provide a variety joint injections, including Viscosupplementation (AKA: Gel Therapy).  Prescription Pain Medication. We specialize in alternatives to opioids. We can provide evaluations and recommendations for/of pharmacologic therapies based on CDC Guidelines.  We no longer take patients for long-term medication management. We will not be taking over your pain medications.  ____________________________________________________________________________________________    ____________________________________________________________________________________________  Patient Information update  To: All of our patients.  Re: Name change.  It has been made official that our current name, "[facility]"   will soon be changed to "[facility]".   The purpose of this change is to eliminate any confusion created by the concept of our practice being a "Medication Management Pain Clinic". In the past this has led to the misconception that we treat pain primarily by the use of prescription medications.  Nothing can be farther from the truth.   Understanding PAIN MANAGEMENT: To  further understand what our practice does, you first have to understand that "Pain Management" is a subspecialty that requires additional training once a physician has completed their specialty training, which can be in either Anesthesia, Neurology, Psychiatry, or Physical Medicine and Rehabilitation (PMR). Each one of these contributes to the final approach taken by each physician to the management of their patient's pain. To be a "Pain Management Specialist" you must have first completed one of the specialty trainings below.  Anesthesiologists - trained in clinical pharmacology and interventional techniques such as nerve blockade and regional as well as central neuroanatomy. They are trained to block pain before, during, and after surgical interventions.  Neurologists - trained in the diagnosis and pharmacological treatment of complex neurological conditions, such as Multiple Sclerosis, Parkinson's, spinal cord injuries, and other systemic conditions that may be associated with symptoms that may include but are not limited to pain. They tend to rely primarily on the treatment of chronic pain using prescription medications.  Psychiatrist - trained in conditions affecting the psychosocial wellbeing of patients including but not limited to depression, anxiety, schizophrenia, personality disorders, addiction, and other substance use disorders that may be associated with chronic pain. They tend to rely primarily on the treatment of chronic pain using prescription medications.   Physical Medicine and Rehabilitation (PMR) physicians, also known as physiatrists - trained to treat a wide variety of medical conditions affecting the brain, spinal cord, nerves, bones, joints, ligaments, muscles, and tendons. Their training is primarily aimed at treating patients that have suffered injuries that have caused severe physical impairment. Their training is primarily aimed at the physical therapy and rehabilitation of those  patients. They may also work alongside orthopedic surgeons or neurosurgeons using their expertise in assisting surgical patients to recover after their surgeries.  INTERVENTIONAL PAIN MANAGEMENT is sub-subspecialty of Pain Management.  Our physicians are Board-certified in Anesthesia, Pain Management, and Interventional Pain Management.  This meaning that not only have they been trained  and Board-certified in their specialty of Anesthesia, and subspecialty of Pain Management, but they have also received further training in the sub-subspecialty of Interventional Pain Management, in order to become Board-certified as INTERVENTIONAL PAIN MANAGEMENT SPECIALIST.    Mission: Our goal is to use our skills in  INTERVENTIONAL PAIN MANAGEMENT as alternatives to the chronic use of prescription opioid medications for the treatment of pain. To make this more clear, we have changed our name to reflect what we do and offer. We will continue to offer medication management assessment and recommendations, but we will not be taking over any patient's medication management.  ____________________________________________________________________________________________

## 2023-08-17 ENCOUNTER — Encounter: Payer: Self-pay | Admitting: Pain Medicine

## 2023-08-17 ENCOUNTER — Telehealth: Payer: Self-pay

## 2023-08-17 ENCOUNTER — Ambulatory Visit
Admission: RE | Admit: 2023-08-17 | Discharge: 2023-08-17 | Disposition: A | Discharge status: Home / Self Care | Payer: Medicare Other | Source: Ambulatory Visit | Attending: Pain Medicine | Admitting: Pain Medicine

## 2023-08-17 ENCOUNTER — Ambulatory Visit (HOSPITAL_BASED_OUTPATIENT_CLINIC_OR_DEPARTMENT_OTHER): Payer: Medicare Other | Admitting: Pain Medicine

## 2023-08-17 VITALS — BP 179/83 | HR 66 | Temp 97.7°F | Resp 18 | Ht 65.0 in | Wt 171.0 lb

## 2023-08-17 DIAGNOSIS — M542 Cervicalgia: Secondary | ICD-10-CM | POA: Insufficient documentation

## 2023-08-17 DIAGNOSIS — M25552 Pain in left hip: Secondary | ICD-10-CM | POA: Insufficient documentation

## 2023-08-17 DIAGNOSIS — Z789 Other specified health status: Secondary | ICD-10-CM

## 2023-08-17 DIAGNOSIS — M25561 Pain in right knee: Secondary | ICD-10-CM | POA: Insufficient documentation

## 2023-08-17 DIAGNOSIS — M25562 Pain in left knee: Secondary | ICD-10-CM | POA: Insufficient documentation

## 2023-08-17 DIAGNOSIS — R200 Anesthesia of skin: Secondary | ICD-10-CM | POA: Insufficient documentation

## 2023-08-17 DIAGNOSIS — Z7901 Long term (current) use of anticoagulants: Secondary | ICD-10-CM | POA: Insufficient documentation

## 2023-08-17 DIAGNOSIS — M545 Low back pain, unspecified: Secondary | ICD-10-CM | POA: Diagnosis present

## 2023-08-17 DIAGNOSIS — H9190 Unspecified hearing loss, unspecified ear: Secondary | ICD-10-CM | POA: Insufficient documentation

## 2023-08-17 DIAGNOSIS — R937 Abnormal findings on diagnostic imaging of other parts of musculoskeletal system: Secondary | ICD-10-CM

## 2023-08-17 DIAGNOSIS — Z79899 Other long term (current) drug therapy: Secondary | ICD-10-CM

## 2023-08-17 DIAGNOSIS — M25551 Pain in right hip: Secondary | ICD-10-CM | POA: Insufficient documentation

## 2023-08-17 DIAGNOSIS — M25511 Pain in right shoulder: Secondary | ICD-10-CM | POA: Insufficient documentation

## 2023-08-17 DIAGNOSIS — M899 Disorder of bone, unspecified: Secondary | ICD-10-CM | POA: Insufficient documentation

## 2023-08-17 DIAGNOSIS — M79605 Pain in left leg: Secondary | ICD-10-CM | POA: Insufficient documentation

## 2023-08-17 DIAGNOSIS — G8929 Other chronic pain: Secondary | ICD-10-CM | POA: Insufficient documentation

## 2023-08-17 DIAGNOSIS — R29898 Other symptoms and signs involving the musculoskeletal system: Secondary | ICD-10-CM | POA: Insufficient documentation

## 2023-08-17 DIAGNOSIS — R202 Paresthesia of skin: Secondary | ICD-10-CM | POA: Insufficient documentation

## 2023-08-17 DIAGNOSIS — M79604 Pain in right leg: Secondary | ICD-10-CM | POA: Insufficient documentation

## 2023-08-17 DIAGNOSIS — M25512 Pain in left shoulder: Secondary | ICD-10-CM | POA: Diagnosis present

## 2023-08-17 DIAGNOSIS — G894 Chronic pain syndrome: Secondary | ICD-10-CM

## 2023-08-17 NOTE — Progress Notes (Signed)
Safety precautions to be maintained throughout the outpatient stay will include: orient to surroundings, keep bed in low position, maintain call bell within reach at all times, provide assistance with transfer out of bed and ambulation.  

## 2023-08-17 NOTE — Telephone Encounter (Signed)
Dr Laban Emperor would like clearance to stop Plavix for 7 days for a lumbar procedure.  Thank you.

## 2023-08-21 LAB — COMPLIANCE DRUG ANALYSIS, UR

## 2023-08-31 LAB — COMP. METABOLIC PANEL (12)
AST: 17 [IU]/L (ref 0–40)
Albumin: 3.9 g/dL (ref 3.8–4.8)
Alkaline Phosphatase: 91 [IU]/L (ref 44–121)
BUN/Creatinine Ratio: 10 — ABNORMAL LOW (ref 12–28)
BUN: 38 mg/dL — ABNORMAL HIGH (ref 8–27)
Bilirubin Total: 0.3 mg/dL (ref 0.0–1.2)
Calcium: 9.1 mg/dL (ref 8.7–10.3)
Chloride: 106 mmol/L (ref 96–106)
Creatinine, Ser: 3.69 mg/dL — ABNORMAL HIGH (ref 0.57–1.00)
Globulin, Total: 2.1 g/dL (ref 1.5–4.5)
Glucose: 103 mg/dL — ABNORMAL HIGH (ref 70–99)
Potassium: 4.7 mmol/L (ref 3.5–5.2)
Sodium: 137 mmol/L (ref 134–144)
Total Protein: 6 g/dL (ref 6.0–8.5)
eGFR: 12 mL/min/{1.73_m2} — ABNORMAL LOW (ref >59)

## 2023-08-31 LAB — SEDIMENTATION RATE: Sed Rate: 5 mm/h (ref 0–40)

## 2023-08-31 LAB — 25-HYDROXY VITAMIN D LCMS D2+D3
25-Hydroxy, Vitamin D-2: <1 ng/mL
25-Hydroxy, Vitamin D-3: 71 ng/mL
25-Hydroxy, Vitamin D: 72 ng/mL

## 2023-08-31 LAB — MAGNESIUM: Magnesium: 1.7 mg/dL (ref 1.6–2.3)

## 2023-08-31 LAB — C-REACTIVE PROTEIN: CRP: 1 mg/L (ref 0–10)

## 2023-08-31 LAB — VITAMIN B12: Vitamin B-12: 1037 pg/mL (ref 232–1245)

## 2023-09-01 ENCOUNTER — Ambulatory Visit: Payer: Medicare Other

## 2023-09-01 DIAGNOSIS — Z0000 Encounter for general adult medical examination without abnormal findings: Secondary | ICD-10-CM | POA: Diagnosis not present

## 2023-09-01 NOTE — Progress Notes (Signed)
Subjective:   Betty Nielsen is a 77 y.o. female who presents for Medicare Annual (Subsequent) preventive examination.  Visit Complete: Virtual I connected with  Betty Nielsen on 09/01/23 by a audio enabled telemedicine application and verified that I am speaking with the correct person using two identifiers.  Patient Location: Home  Provider Location: Office/Clinic  I discussed the limitations of evaluation and management by telemedicine. The patient expressed understanding and agreed to proceed.  Vital Signs: Because this visit was a virtual/telehealth visit, some criteria may be missing or patient reported. Any vitals not documented were not able to be obtained and vitals that have been documented are patient reported.  Cardiac Risk Factors include: advanced age (>3men, >76 women);dyslipidemia;hypertension;sedentary lifestyle     Objective:    Today's Vitals   09/01/23 1018  PainSc: 0-No pain   There is no height or weight on file to calculate BMI.     09/01/2023   10:23 AM 08/17/2023   10:07 AM 06/25/2023   10:37 PM 06/25/2023    6:53 AM 02/23/2023    1:37 AM 09/09/2021    2:17 PM 08/25/2021    3:41 PM  Advanced Directives  Does Patient Have a Medical Advance Directive? Yes Yes  No No Yes Yes  Type of Estate agent of [place];Living will Healthcare Power of [place];Living will    Living will;Healthcare Power of [employer] Power of [place];Living will  Copy of Healthcare Power of Attorney in Chart? No - copy requested      No - copy requested  Would patient like information on creating a medical advance directive?   No - Patient declined        Current Medications (verified) Outpatient Encounter Medications as of 09/01/2023  Medication Sig   acetaminophen (TYLENOL) 650 MG CR tablet Take 1 tablet (650 mg total) by mouth every 8 (eight) hours as needed for pain.   aspirin EC 81 MG tablet Take 81 mg by mouth daily. Swallow whole.    atorvastatin (LIPITOR) 40 MG tablet TAKE 1 TABLET BY MOUTH ONCE DAILY AT  6PM.  CHOLESTEROL  MEDICATION.   calcitRIOL (ROCALTROL) 0.25 MCG capsule Take 0.25 mcg by mouth daily.   Cholecalciferol 125 MCG (5000 UT) TABS Take 5,000 Units by mouth daily.   clopidogrel (PLAVIX) 75 MG tablet TAKE 1 TABLET BY MOUTH ONCE DAILY .  BLOOD  THINNER  TO  HELP  REDUCE  STROKE  RISK   ezetimibe (ZETIA) 10 MG tablet Take 1 tablet (10 mg total) by mouth daily.   labetalol (NORMODYNE) 300 MG tablet Take 1 tablet (300 mg total) by mouth 2 (two) times daily.   latanoprost (XALATAN) 0.005 % ophthalmic solution Place 1 drop into both eyes at bedtime.   levothyroxine (SYNTHROID) 75 MCG tablet Take 1 tablet (75 mcg total) by mouth daily before breakfast.   senna-docusate (SENOKOT-S) 8.6-50 MG tablet Take 1 tablet by mouth at bedtime as needed for mild constipation.   No facility-administered encounter medications on file as of 09/01/2023.    Allergies (verified) Ace inhibitors, Ciprofloxacin, Citalopram, Hydrocodone-acetaminophen, Norvasc [amlodipine besylate], Pantoprazole sodium, Pravastatin, Sulfa antibiotics, and Clonidine   History: Past Medical History:  Diagnosis Date   Arthrosis of knee    Chronic kidney disease    Hyperlipidemia    Hypertension    Loss of hearing    Metabolic syndrome    Plantar fasciitis    Renal insufficiency    Past Surgical History:  Procedure Laterality Date  ABDOMINAL AORTIC ANEURYSM REPAIR     ABDOMINAL HYSTERECTOMY     APPENDECTOMY     CORONARY STENT INTERVENTION N/A 10/26/2016   Procedure: Coronary Stent Intervention;  Surgeon: Alwyn Pea, MD;  Location: [facility] INVASIVE CV LAB;  Service: Cardiovascular;  Laterality: N/A;   HERNIA REPAIR     INNER EAR SURGERY     pt not sure of type   LEFT HEART CATH AND CORONARY ANGIOGRAPHY N/A 10/26/2016   Procedure: Left Heart Cath and Coronary Angiography;  Surgeon: Dalia Heading, MD;  Location: [facility] INVASIVE CV LAB;  Service:  Cardiovascular;  Laterality: N/A;   RENAL ANGIOGRAPHY N/A 06/11/2021   Procedure: RENAL ANGIOGRAPHY;  Surgeon: Annice Needy, MD;  Location: [facility] INVASIVE CV LAB;  Service: Cardiovascular;  Laterality: N/A;   THYROIDECTOMY     Family History  Problem Relation Age of Onset   Healthy Mother    Cerebral aneurysm Father    Social History   Socioeconomic History   Marital status: Widowed    Spouse name: Not on file   Number of children: 1   Years of education: GED   Highest education level: 10th grade  Occupational History   Occupation: Retired  Tobacco Use   Smoking status: Former    Current packs/day: 0.00    Average packs/day: 0.5 packs/day for 20.0 years (10.0 ttl pk-yrs)    Types: Cigarettes    Start date: 26    Quit date: 2000    Years since quitting: 24.9   Smokeless tobacco: Never   Tobacco comments:    smoking cessation materials not required  Vaping Use   Vaping status: Never Used  Substance and Sexual Activity   Alcohol use: No    Alcohol/week: 0.0 standard drinks of alcohol   Drug use: No   Sexual activity: Not Currently  Other Topics Concern   Not on file  Social History Narrative   Lives alone   Retired from working a [employer]    Social Drivers of Corporate investment banker Strain: Low Risk  (09/01/2023)   Overall Financial Resource Strain (CARDIA)    Difficulty of Paying Living Expenses: Not hard at all  Food Insecurity: No Food Insecurity (09/01/2023)   Hunger Vital Sign    Worried About Running Out of Food in the Last Year: Never true    Ran Out of Food in the Last Year: Never true  Transportation Needs: No Transportation Needs (09/01/2023)   PRAPARE - Administrator, Civil Service (Medical): No    Lack of Transportation (Non-Medical): No  Physical Activity: Inactive (09/01/2023)   Exercise Vital Sign    Days of Exercise per Week: 0 days    Minutes of Exercise per Session: 0 min  Stress: No Stress Concern Present (09/01/2023)   [employer] of Occupational Health - Occupational Stress Questionnaire    Feeling of Stress : Only a little  Social Connections: Socially Isolated (09/01/2023)   Social Connection and Isolation Panel [NHANES]    Frequency of Communication with Friends and Family: Twice a week    Frequency of Social Gatherings with Friends and Family: Twice a week    Attends Religious Services: Never    Database administrator or Organizations: No    Attends Banker Meetings: Never    Marital Status: Widowed    Tobacco Counseling Counseling given: Not Answered Tobacco comments: smoking cessation materials not required   Clinical Intake:  Pre-visit preparation completed: Yes  Pain : No/denies pain Pain Score: 0-No pain     BMI - recorded: 28.5 Nutritional Status: BMI 25 -29 Overweight Nutritional Risks: None Diabetes: No  How often do you need to have someone help you when you read instructions, pamphlets, or other written materials from your doctor or pharmacy?: 1 - Never  Interpreter Needed?: No  Information entered by :: Kennedy Bucker, LPN   Activities of Daily Living    09/01/2023   10:25 AM 07/08/2023    1:22 PM  In your present state of health, do you have any difficulty performing the following activities:  Hearing? 1 1  Comment  Hearing aids  Vision? 0 0  Difficulty concentrating or making decisions? 0 0  Walking or climbing stairs? 1 1  Comment BACK PAIN   Dressing or bathing? 0 0  Doing errands, shopping? 0 1  Preparing Food and eating ? N   Using the Toilet? N   In the past six months, have you accidently leaked urine? N   Do you have problems with loss of bowel control? N   Managing your Medications? N   Managing your Finances? N   Housekeeping or managing your Housekeeping? N     Patient Care Team: Alba Cory, MD as PCP - General (Family Medicine) Lamont Dowdy, MD as Consulting Physician (Internal Medicine) Alwyn Pea, MD as  Consulting Physician (Cardiology) Kandyce Rud., MD as Consulting Physician (Rheumatology) Chuck Hint, MD (Inactive) as Consulting Physician (Vascular Surgery) Gaspar Cola, RPH (Inactive) (Pharmacist) Rickard Patience, MD as Consulting Physician (Hematology and Oncology) Pa, [facility] (Optometry)  Indicate any recent Medical Services you may have received from other than [facility] providers in the past year (date may be approximate).     Assessment:   This is a routine wellness examination for Jeanelly.  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 1 1 1 1  0  Number falls in past yr: 1 0 1 0 0  Injury with Fall? 1 1 1 1  0  Comment  hurt coccyx     Risk for fall due to : History of fall(s)  History of fall(s);Impaired balance/gait Impaired balance/gait No Fall Risks  Follow up Falls evaluation completed;Falls prevention discussed  Falls prevention discussed;Education provided;Falls evaluation completed Falls prevention discussed Falls prevention discussed;Education provided;Falls evaluation completed    MEDICARE RISK AT HOME: Medicare Risk at Home Any stairs in or around the home?: No If so, are there any without handrails?:  No Home free of loose throw rugs in walkways, pet beds, electrical cords, etc?: Yes Adequate  lighting in your home to reduce risk of falls?: Yes Life alert?: No Use of a cane, walker or w/c?: Yes (CANE) Grab bars in the bathroom?: Yes Shower chair or bench in shower?: Yes Elevated toilet seat or a handicapped toilet?: No  TIMED UP AND GO:  Was the test performed?  No    Cognitive Function:        09/01/2023   10:26 AM 08/25/2022   10:04 AM 06/07/2019    2:53 PM 03/30/2018    2:06 PM  6CIT Screen  What Year? 0 points 0 points 0 points 0 points  What month? 0 points 0 points 0 points 0 points  What time? 0 points 0 points 0 points 0 points  Count back from 20 0 points 0 points 0 points 0 points  Months in reverse 0 points 0 points 0 points 0 points  Repeat phrase 0 points 2 points 0 points 2 points  Total Score 0 points 2 points 0 points 2 points    Immunizations Immunization History  Administered Date(s) Administered   Influenza, High Dose Seasonal PF 06/05/2015, 05/12/2017, 06/03/2019, 07/20/2019   Influenza-Unspecified 05/12/2017, 06/02/2020, 05/28/2021   PFIZER(Purple Top)SARS-COV-2 Vaccination 11/12/2019, 11/30/2019, 09/15/2020   Pneumococcal Conjugate-13 07/17/2014   Pneumococcal Polysaccharide-23 11/24/2015   Zoster Recombinant(Shingrix) 03/22/2022, 06/16/2022    TDAP status: Due, Education has been provided regarding the importance of this vaccine. Advised may receive this vaccine at local pharmacy or Health Dept. Aware to provide a copy of the vaccination record if obtained from local pharmacy or Health Dept. Verbalized acceptance and understanding.  Flu Vaccine status: Declined, Education has been provided regarding the importance of this vaccine but patient still declined. Advised may receive this vaccine at local pharmacy or Health Dept. Aware to provide a copy of the vaccination record if obtained from local pharmacy or Health Dept. Verbalized acceptance and understanding.  Pneumococcal vaccine status: Declined,  Education has been provided regarding the  importance of this vaccine but patient still declined. Advised may receive this vaccine at local pharmacy or Health Dept. Aware to provide a copy of the vaccination record if obtained from local pharmacy or Health Dept. Verbalized acceptance and understanding.   Covid-19 vaccine status: Completed vaccines  Qualifies for Shingles Vaccine? Yes   Zostavax completed No   Shingrix Completed?: Yes  Screening Tests Health Maintenance  Topic Date Due   COVID-19 Vaccine (4 - 2024-25 season) 05/22/2023   INFLUENZA VACCINE  12/19/2023 (Originally 04/21/2023)   DEXA SCAN  01/04/2024 (Originally 05/29/2011)   Medicare Annual Wellness (AWV)  08/31/2024   Pneumonia Vaccine 18+ Years old  Completed   Hepatitis C Screening  Completed   Zoster Vaccines- Shingrix  Completed   HPV VACCINES  Aged Out   DTaP/Tdap/Td  Discontinued    Health Maintenance  Health Maintenance Due  Topic Date Due   COVID-19 Vaccine (4 - 2024-25 season) 05/22/2023    Colorectal cancer screening: No longer required.   Mammogram status: No longer required due to AGE.  DECLINED REFERRAL FOR BDS  Lung Cancer Screening: (Low Dose CT Chest recommended if Age 7-80 years, 20 pack-year currently smoking OR have quit w/in 15years.) does not qualify.    Additional Screening:  Hepatitis C Screening: does qualify; Completed 09/03/12  Vision Screening: Recommended annual ophthalmology exams for early detection of glaucoma and other disorders of the eye. Is the patient up to date  with their annual eye exam?  Yes  Who is the provider or what is the name of the office in which the patient attends annual eye exams? [facility] If pt is not established with a provider, would they like to be referred to a provider to establish care? No .   Dental Screening: Recommended annual dental exams for proper oral hygiene   Community Resource Referral / Chronic Care Management: CRR required this visit?  No   CCM required this visit?   No     Plan:     I have personally reviewed and noted the following in the patient's chart:   Medical and social history Use of alcohol, tobacco or illicit drugs  Current medications and supplements including opioid prescriptions. Patient is not currently taking opioid prescriptions. Functional ability and status Nutritional status Physical activity Advanced directives List of other physicians Hospitalizations, surgeries, and ER visits in previous 12 months Vitals Screenings to include cognitive, depression, and falls Referrals and appointments  In addition, I have reviewed and discussed with patient certain preventive protocols, quality metrics, and best practice recommendations. A written personalized care plan for preventive services as well as general preventive health recommendations were provided to patient.     Hal Hope, LPN   16/06/[id number]   After Visit Summary: (MyChart) Due to this being a telephonic visit, the after visit summary with patients personalized plan was offered to patient via MyChart   Nurse Notes: NONE

## 2023-09-01 NOTE — Patient Instructions (Addendum)
Betty Nielsen , Thank you for taking time to come for your Medicare Wellness Visit. I appreciate your ongoing commitment to your health goals. Please review the following plan we discussed and let me know if I can assist you in the future.   Referrals/Orders/Follow-Ups/Clinician Recommendations: NONE  This is a list of the screening recommended for you and due dates:  Health Maintenance  Topic Date Due   COVID-19 Vaccine (4 - 2024-25 season) 05/22/2023   Flu Shot  12/19/2023*   DEXA scan (bone density measurement)  01/04/2024*   Medicare Annual Wellness Visit  08/31/2024   Pneumonia Vaccine  Completed   Hepatitis C Screening  Completed   Zoster (Shingles) Vaccine  Completed   HPV Vaccine  Aged Out   DTaP/Tdap/Td vaccine  Discontinued  *Topic was postponed. The date shown is not the original due date.    Advanced directives: (Copy Requested) Please bring a copy of your health care power of attorney and living will to the office to be added to your chart at your convenience.  Next Medicare Annual Wellness Visit scheduled for next year: Yes   09/06/24 @ 10:10 IN PERSON

## 2023-09-06 ENCOUNTER — Other Ambulatory Visit: Payer: Self-pay | Admitting: Family Medicine

## 2023-09-06 DIAGNOSIS — Z9889 Other specified postprocedural states: Secondary | ICD-10-CM

## 2023-09-08 NOTE — Progress Notes (Signed)
PROVIDER NOTE: Information contained herein reflects review and annotations entered in association with encounter. Interpretation of such information and data should be left to medically-trained personnel. Information provided to patient can be located elsewhere in the medical record under "Patient Instructions". Document created using STT-dictation technology, any transcriptional errors that may result from process are unintentional.    Patient: Betty Nielsen  Service Category: E/M  Provider: Oswaldo Done, MD  DOB: [birth year]  DOS: 09/12/2023  Referring Provider: Alba Cory, MD  MRN: [id number]  Specialty: Interventional Pain Management  PCP: Alba Cory, MD  Type: Established Patient  Setting: Ambulatory outpatient    Location: Office  Delivery: Face-to-face     Primary Reason(s) for Visit: Encounter for evaluation before starting new chronic pain management plan of care (Level of risk: moderate) CC: Back Pain and Knee Pain (bilat)  HPI  Betty Nielsen is a 77 y.o. year old, female patient, who comes today for a follow-up evaluation to review the test results and decide on a treatment plan. She has AAA (abdominal aortic aneurysm) without rupture (HCC); Acid reflux; Benign secondary hypertension due to renal artery stenosis (HCC); Arthritis, degenerative; Hypothyroidism, postsurgical; Major depression in remission (HCC); Obesity (BMI 30.0-34.9); Neuropathy; Perennial allergic rhinitis with seasonal variation; 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HCC); BPV (benign positional vertigo); Headache; Hyperlipidemia; History of non-ST elevation myocardial infarction (NSTEMI); History of coronary angioplasty; History of stroke; Anemia of chronic disease; Hypo-osmolality and hyponatremia; Secondary hyperparathyroidism of renal origin (HCC); Left upper extremity numbness; Left facial numbness; Elevated serum creatinine; Accelerated hypertension; Hyperkalemia; Renal artery stenosis (HCC); Prediabetes;  Hypertension; Hypertensive urgency; Metabolic acidosis; Inadequate pain control; Spinal stenosis; Benign hypertensive kidney disease with chronic kidney disease; Proteinuria; Chronic kidney disease (CKD), stage V (HCC); Abnormal MRI, lumbar spine (06/25/2023); Chronic pain syndrome; Pharmacologic therapy; Disorder of skeletal system; Problems influencing health status; Chronic anticoagulation (Plavix); Hard of hearing; Chronic low back pain (1ry area of Pain) (Bilateral) w/o sciatica; Chronic lower extremity pain (2ry area of Pain) (Bilateral); Weakness of lower extremities (Bilateral); Chronic hip pain (3ry area of Pain) (Bilateral); Chronic knee pain (4th area of Pain) (Bilateral); Numbness and tingling of feet (Bilateral); Chronic neck pain (5th area of Pain) (Bilateral); Chronic shoulder pain (6th area of Pain) (Bilateral); Numbness and tingling in hands (intermittent) (Bilateral); and Herniated nucleus pulposus, L2-3 (Right) on their problem list. Her primarily concern today is the Back Pain and Knee Pain (bilat)  Pain Assessment: Location: Lower Back Radiating: to hips bilat; pt states that in addition, she is also experiencing pain in her knees bilt and left shin Onset: More than a month ago Duration: Chronic pain Quality: Aching Severity: 8 /10 (subjective, self-reported pain score)  Effect on ADL: limits daily activities Timing: Constant Modifying factors: Tylenol BP: (!) 190/94  HR: (!) 59  Betty Nielsen comes in today for a follow-up visit after her initial evaluation on 08/17/2023. Today we went over the results of her tests. These were explained in "Layman's terms". During today's appointment we went over my diagnostic impression, as well as the proposed treatment plan.  Review of initial evaluation (08/17/2023): "The patient, with a diagnosis of lumbar spinal stenosis, presents for an initial evaluation. They report experiencing severe pain in the back, hips, and knees, with the back pain  being the most severe. The pain in the back is described as being across the entire lower back, but more pronounced on the right side. The patient denies any history of back surgeries, physical therapy, or injections for the  back pain.   The patient also reports pain radiating down both legs, described as a shooting pain, likened to lightning. This pain is intermittent and more pronounced in the mornings. The pain extends from the hips to the knees, and is felt in the front of the legs. The patient also reports a sensation of weakness in the legs, causing them to grab onto something to prevent falling.   In addition to the back and leg pain, the patient experiences pain in both knees, described as severe and constant. The pain is felt in both the front and back of the knees. The patient reports a history of fluid being drawn from the knees and injections administered, which provided minimal relief.   The patient also experiences pain in both shoulders, which is intermittent and not present all the time. They report tingling in both hands, which is relieved by moving the hands. The patient denies any history of surgeries, physical therapy, or injections for the shoulder or hand pain.   The patient has a significant medical history, including an aortic abdominal aneurysm, hypertension, stage 4 chronic kidney disease, history of cerebrovascular accident, coronary artery disease, history of having had a CABG, history of having had an MI and renal artery stenosis. They also have a history of Plavix anticoagulation.   The patient's MRI of the lumbar spine shows mild scoliosis at L3-4 and L4-5 with an L4-5 anterolisthesis. 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 There is a small right paracentral disc protrusion and moderate right subarticular recess narrowing. At the L2-3 level, there is a bulging disc with right paracentral  extrusion that is downward migrating and markedly impinges on the descending right L3 nerve root herniation. There is mild to moderate spinal canal narrowing but patent foramina. At the L3-4 level, there is disc narrowing and bulging with right foraminal protrusion. At the L4-5 level, there is facet degeneration with spurring and anterolisthesis. At the L5-S1 level, there is disc narrowing with endplate and facet spurring. The impression on the MRI is that there is advanced and generalized lumbar spine degeneration with mild scoliosis and multilevel spondylolisthesis."  Review of diagnostic tests ordered on 08/17/2023: Lab work demonstrated the patient has blood glucose to be mildly elevated at 103 mg/dL (normal 16-10) with elevated BUN and creatinine and a significantly decreased GFR of 12 (normal more than 59 mL/min per 1.73) and decreased BUN/creatinine ratio of 10 (normal 12-28).  Diagnostic x-rays of the cervical spine with flexion and extension views demonstrate multilevel degenerative disc disease and facet hypertrophy of the cervical region with bilateral bony neural foraminal stenosis at C3-4, C4-5, and C6-7 on the left.  In addition it shows a broad based levoscoliotic curvature with a 2-3 mm retrolisthesis of C3 over C4 unchanged on flexion or extension with no evidence of abnormal motion.  There is prominent facet hypertrophy at C4-5 and C5-6 with the left being greater than the right and moderate facet hypertrophy at C6-7 on the right.  Diagnostic x-rays of both hips show mild degenerative spurring of both hips at the acetabular level.  Diagnostic x-rays of the right knee shows tricompartmental osteoarthritis moderate in the medial tibiofemoral compartment with a small knee joint effusion and faint chondrocalcinosis.  Diagnostic x-rays of the left knee show mild tricompartmental osteoarthritis of the left knee.  Diagnostic x-rays of the lumbar spine with bending views shows Prose based levoscoliotic  curvature with  diffuse degenerative disc disease and facet hypertrophy.  It also demonstrates a grade 1 anterolisthesis of L3 over L4 and L4 over L5 with no significant changes on flexion or extension.  Discussed the use of AI scribe software for clinical note transcription with the patient, who gave verbal consent to proceed.  History of Present Illness   The patient, with a history of chronic kidney disease and degenerative disc disease, presented for a follow-up visit after initial consultation on November 27th. The patient's recent comprehensive metabolic panel showed elevated blood glucose, BUN, and creatinine levels, indicating worsening kidney function. The patient was aware of her kidney issues prior to this visit.  The patient also has degenerative disc disease and facet joint problems in the neck area, as shown by recent x-rays. The patient has facet hypertrophy in the cervical region, causing potential nerve irritation and subsequent pain, numbness, or weakness in the arms. The x-rays also revealed narrowing between the C3, 4, C4, 5, and C6, 7 levels due to bone growth around the opening. The patient has a slight curvature of the cervical spine and a 2 to 3 mm retrolestesis of C3 over C4, which could potentially cause central spinal stenosis and subsequent spasms.  The patient also reported pain in the lower back, knees, and shoulders. X-rays of the left shoulder showed arthritis of the acromioclavicular joint and the larger shoulder joint. The patient also has degenerative disc disease and facet joint problems in the lumbar region, with anterolisthesis of L3 over L4 and L4 over L5. This could potentially cause central canal problems and pain in both legs, or neuroforaminal narrowing and unilateral or bilateral pain if both sides are affected.  The patient's primary concern during the visit was lower back pain, followed by leg pain, hip pain, knee pain, neck pain, shoulder pain, and headaches.  The patient is currently on Plavix, which would need to be stopped before any injections to prevent bleeding. The patient was given copies of her x-rays to review at home.      Patient presented with interventional treatment options. Ms. Gholar was informed that I will not be providing medication management. Pharmacotherapy evaluation including recommendations may be offered, if specifically requested.   Controlled Substance Pharmacotherapy Assessment REMS (Risk Evaluation and Mitigation Strategy)  Opioid Analgesic: None MME/day: 0 mg/day  Pill Count: None expected due to no prior prescriptions written by our practice. Nonah Mattes, RN  09/12/2023 10:36 AM  Sign when Signing Visit Safety precautions to be maintained throughout the outpatient stay will include: orient to surroundings, keep bed in low position, maintain call bell within reach at all times, provide assistance with transfer out of bed and ambulation.   Pt advised to notify PCP of high BP readings today.   Pharmacokinetics: Liberation and absorption (onset of action): WNL Distribution (time to peak effect): WNL Metabolism and excretion (duration of action): WNL         Pharmacodynamics: Desired effects: Analgesia: Ms. Trusty reports >50% benefit. Functional ability: Patient reports that medication allows her to accomplish basic ADLs Clinically meaningful improvement in function (CMIF): Sustained CMIF goals met Perceived effectiveness: Described as relatively effective, allowing for increase in activities of daily living (ADL) Undesirable effects: Side-effects or Adverse reactions: None reported Monitoring: [place] PMP: PDMP reviewed during this encounter. Online review of the past 60-month period previously conducted. Not applicable at this point since we have not taken over the patient's medication management yet. List of other Serum/Urine Drug Screening Test(s):  No results found  for: "AMPHSCRSER", "BARBSCRSER",  "BENZOSCRSER", "COCAINSCRSER", "COCAINSCRNUR", "PCPSCRSER", "THCSCRSER", "THCU", "CANNABQUANT", "OPIATESCRSER", "OXYSCRSER", "PROPOXSCRSER", "ETH", "CBDTHCR", "D8THCCBX", "D9THCCBX" List of all UDS test(s) done:  Lab Results  Component Value Date   SUMMARY FINAL 08/17/2023   Last UDS on record: Summary  Date Value Ref Range Status  08/17/2023 FINAL  Final    Comment:    ==================================================================== Compliance Drug Analysis, Ur ==================================================================== Test                             Result       Flag       Units  Drug Present and Declared for Prescription Verification   Acetaminophen                  PRESENT      EXPECTED  Drug Present not Declared for Prescription Verification   Gabapentin                     PRESENT      UNEXPECTED   Clonidine                      PRESENT      UNEXPECTED   Guaifenesin                    PRESENT      UNEXPECTED    Guaifenesin may be administered as an over-the-counter or    prescription drug; it may also be present as a breakdown product of    methocarbamol.  Drug Absent but Declared for Prescription Verification   Salicylate                     Not Detected UNEXPECTED    Aspirin, as indicated in the declared medication list, is not always    detected even when used as directed.  ==================================================================== Test                      Result    Flag   Units      Ref Range   Creatinine              80               mg/dL      >=16 ==================================================================== Declared Medications:  The flagging and interpretation on this report are based on the  following declared medications.  Unexpected results may arise from  inaccuracies in the declared medications.   **Note: The testing scope of this panel does not include small to  moderate amounts of these reported medications:    Acetaminophen (Tylenol)  Aspirin   **Note: The testing scope of this panel does not include the  following reported medications:   Atorvastatin (Lipitor)  Calcitriol (Rocaltrol)  Cholecalciferol  Clopidogrel (Plavix)  Ezetimibe (Zetia)  Latanoprost (Xalatan)  Levothyroxine (Synthroid)  Normodyne  Sennosides (Senokot) ==================================================================== For clinical consultation, please call [phone]. ====================================================================    UDS interpretation: No unexpected findings.          Medication Assessment Form: Not applicable. No opioids. Treatment compliance: Not applicable Risk Assessment Profile: Aberrant behavior: See initial evaluations. None observed or detected today Comorbid factors increasing risk of overdose: See initial evaluation. No additional risks detected today Opioid risk tool (ORT):     08/17/2023   10:06 AM  Opioid Risk   Alcohol 0  Illegal Drugs 0  Rx  Drugs 0  Alcohol 0  Illegal Drugs 0  Rx Drugs 0  Age between 16-45 years  0  History of Preadolescent Sexual Abuse 0  Psychological Disease 0  Depression 0  Opioid Risk Tool Scoring 0  Opioid Risk Interpretation Low Risk    ORT Scoring interpretation table:  Score <3 = Low Risk for SUD  Score between 4-7 = Moderate Risk for SUD  Score >8 = High Risk for Opioid Abuse   Risk of substance use disorder (SUD): Low  Risk Mitigation Strategies:  Patient opioid safety counseling: No controlled substances prescribed. Patient-Prescriber Agreement (PPA): No agreement signed.  Controlled substance notification to other providers: None required. No opioid therapy.  Pharmacologic Plan: Non-opioid analgesic therapy offered. Interventional alternatives discussed.             Laboratory Chemistry Profile   Renal Lab Results  Component Value Date   BUN 38 (H) 08/17/2023   CREATININE 3.69 (H) 08/17/2023   LABCREA 29  06/07/2021   BCR 10 (L) 08/17/2023   GFRAA 31 (L) 06/18/2020   GFRNONAA 8 (L) 06/29/2023   PROTEINUR 100 (A) 06/25/2023     Electrolytes Lab Results  Component Value Date   NA 137 08/17/2023   K 4.7 08/17/2023   CL 106 08/17/2023   CALCIUM 9.1 08/17/2023   MG 1.7 08/17/2023   PHOS 5.6 (H) 06/27/2023     Hepatic Lab Results  Component Value Date   AST 17 08/17/2023   ALT 12 07/08/2023   ALBUMIN 3.9 08/17/2023   ALKPHOS 91 08/17/2023   AMYLASE 339 (H) 04/19/2008   LIPASE 28 08/21/2018     ID Lab Results  Component Value Date   SARSCOV2NAA NEGATIVE 06/06/2021     Bone Lab Results  Component Value Date   VD25OH 79 06/18/2020   25OHVITD1 72 08/17/2023   25OHVITD2 <1.0 08/17/2023   25OHVITD3 71 08/17/2023     Endocrine Lab Results  Component Value Date   GLUCOSE 103 (H) 08/17/2023   GLUCOSEU NEGATIVE 06/25/2023   HGBA1C 5.5 07/06/2022   TSH 4.53 (H) 07/08/2023     Neuropathy Lab Results  Component Value Date   VITAMINB12 1,037 08/17/2023   FOLATE 25.0 07/06/2021   HGBA1C 5.5 07/06/2022     CNS No results found for: "COLORCSF", "APPEARCSF", "RBCCOUNTCSF", "WBCCSF", "POLYSCSF", "LYMPHSCSF", "EOSCSF", "PROTEINCSF", "GLUCCSF", "JCVIRUS", "CSFOLI", "IGGCSF", "LABACHR", "ACETBL"   Inflammation (CRP: Acute  ESR: Chronic) Lab Results  Component Value Date   CRP 1 08/17/2023   ESRSEDRATE 5 08/17/2023   LATICACIDVEN 0.9 06/25/2023     Rheumatology No results found for: "RF", "ANA", "LABURIC", "URICUR", "LYMEIGGIGMAB", "LYMEABIGMQN", "HLAB27"   Coagulation Lab Results  Component Value Date   INR 1.0 06/06/2021   LABPROT 13.2 06/06/2021   APTT 29 06/06/2021   PLT 176 06/29/2023     Cardiovascular Lab Results  Component Value Date   BNP 156.9 (H) 06/06/2021   CKTOTAL 112 04/05/2012   CKMB 0.5 04/05/2012   TROPONINI <0.03 08/29/2018   HGB 8.0 (L) 06/29/2023   HCT 23.7 (L) 06/29/2023   EPIRU [facility] 06/09/2021   NOREPRU NOT PERFORMED 06/09/2021    DOPARU NOT PERFORMED 06/09/2021     Screening Lab Results  Component Value Date   SARSCOV2NAA NEGATIVE 06/06/2021     Cancer No results found for: "CEA", "CA125", "LABCA2"   Allergens No results found for: "ALMOND", "APPLE", "ASPARAGUS", "AVOCADO", "BANANA", "BARLEY", "BASIL", "BAYLEAF", "GREENBEAN", "LIMABEAN", "WHITEBEAN", "BEEFIGE", "REDBEET", "BLUEBERRY", "BROCCOLI", "CABBAGE", "MELON", "CARROT", "CASEIN", "CASHEWNUT", "CAULIFLOWER", "  CELERY"     Note: Lab results reviewed.  Recent Diagnostic Imaging Review  Cervical Imaging: Cervical DG Bending/F/E views: Results for orders placed during the hospital encounter of 08/17/23 DG Cervical Spine With Flex & Extend  Narrative CLINICAL DATA:  Cervicalgia, chronic neck pain. Chronic pain of both shoulders. Numbness and tingling in both hands.  EXAM: CERVICAL SPINE COMPLETE WITH FLEXION AND EXTENSION VIEWS  COMPARISON:  None Available.  FINDINGS: 2-3 mm retrolisthesis of L3 on L4, unchanged on flexion and extension. No evidence of abnormal motion. Broad-based levo scoliotic curvature. There is slight straightening of normal lordosis. Disc space narrowing and spurring at C3-C4, C4-C5, C5-C6, and C6-C7. Prominent facet hypertrophy at C4-C5 and C5-C6, left greater than right. Moderate facet hypertrophy at C6-C7 on the right. There is bilateral bony neural foraminal stenosis at C3-C4 and suspected additional neural foraminal stenosis at C4-C5 and C6-C7 on the left. No evidence of fracture, focal bone abnormality or bony destructive change. No prevertebral soft tissue thickening. Carotid calcifications.  IMPRESSION: 1. Multilevel degenerative disc disease and facet hypertrophy. 2. Bilateral bony neural foraminal stenosis at C3-C4 and suspected additional neural foraminal stenosis at C4-C5 and C6-C7 on the left. 3. Broad-based levo scoliotic curvature. 2-3 mm retrolisthesis of C3 on C4, unchanged on flexion and extension. No  evidence of abnormal motion.   Electronically Signed By: Narda Rutherford M.D. On: 08/19/2023 11:44  Shoulder Imaging: Shoulder-R DG: Results for orders placed during the hospital encounter of 08/17/23 DG Shoulder Right  Narrative CLINICAL DATA:  Chronic pain of both shoulders. Right shoulder pain.  EXAM: RIGHT SHOULDER - 2+ VIEW  COMPARISON:  None Available.  FINDINGS: There is no evidence of fracture or dislocation. Moderate acromioclavicular degenerative change with inferior spurring. Mild to moderate glenohumeral degenerative change with joint space narrowing and inferior spurring. No erosions or bony destructive change. Soft tissues are unremarkable.  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   Electronically Signed By: Narda Rutherford M.D. On: 08/19/2023 11:41  Shoulder-L DG: Results for orders placed during the hospital encounter of 08/17/23 DG Shoulder Left  Narrative CLINICAL DATA:  Chronic pain in both shoulders.  Left shoulder pain.  EXAM: LEFT SHOULDER - 2+ VIEW  COMPARISON:  None Available.  FINDINGS: There is no evidence of fracture or dislocation. Suspected os acromial. There is mild acromioclavicular degenerative change with inferior spurring. Mild glenohumeral degenerative change with inferior spurring. No erosions or bone destruction. Soft tissues are unremarkable.  IMPRESSION: 1. Mild acromioclavicular and glenohumeral degenerative change. 2. Suspected os acromial.   Electronically Signed By: Narda Rutherford M.D. On: 08/19/2023 11:49  Lumbosacral Imaging: Lumbar MR wo contrast: Results for orders placed during the hospital encounter of 06/25/23 MR LUMBAR SPINE WO CONTRAST  Narrative CLINICAL DATA:  Low back pain with symptoms persisting over 6 weeks of treatment.  EXAM: MRI LUMBAR SPINE WITHOUT CONTRAST  TECHNIQUE: Multiplanar, multisequence MR imaging of the lumbar spine  was performed. No intravenous contrast was administered.  COMPARISON:  02/23/2023 abdominal CT  FINDINGS: Segmentation:  Standard.  Alignment:  Mild scoliosis and L3-4, L4-5 anterolisthesis.  Vertebrae: No fracture, evidence of discitis, or bone lesion. Minimal degenerative edema at the L3 superior endplate.  Conus medullaris and cauda equina: Conus extends to the L1 level. Conus and cauda equina appear normal.  Paraspinal and other soft tissues: Aneurysmal aorta affecting infra and suprarenal segments and measuring up to 4.26 cm at the hiatus, there is contemporaneous MRA of the abdomen. Polycystic kidneys.  Disc levels:  L1-L2: Small right paracentral protrusion. Moderate right subarticular recess narrowing.  L2-L3: Bulging disc with right paracentral extrusion that is downward migrating and markedly impinges on the descending right L3 nerve root. Herniation nearly reaches the L3-4 disc space. Mild to moderate spinal canal narrowing. Patent foramina  L3-L4: Disc narrowing and bulging with right foraminal protrusion. Degenerative facet spurring and ligamentum flavum thickening. Advanced spinal stenosis. Moderate right foraminal narrowing.  L4-L5: Facet degeneration with spurring and anterolisthesis. Circumferential disc bulging. Moderate spinal stenosis with effacement of both subarticular recesses.  L5-S1:Disc narrowing with endplate and facet spurring. The canal and foramina are patent.  IMPRESSION: 1. Advanced and generalized lumbar spine degeneration with mild scoliosis and multilevel listhesis. Dominant findings at L2-3 where there is a large right paracentral extrusion that migrates inferiorly and severely impinges on the right L3 nerve root. 2. 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 Up to moderate foraminal narrowing on the right at L3-4.   Electronically Signed By: Tiburcio Pea M.D. On: 06/25/2023 12:33  Lumbar DG Bending views: Results  for orders placed during the hospital encounter of 08/17/23 DG Lumbar Spine Complete W/Bend  Narrative CLINICAL DATA:  Chronic bilateral low back pain without sciatica. Chronic pain of lower extremity, bilateral. Weakness of both lower extremities. Chronic hip pain, bilateral. Chronic bilateral knee pain. Numbness and tingling of both feet.  EXAM: LUMBAR SPINE - COMPLETE WITH BENDING VIEWS  COMPARISON:  Lumbar MRI 06/25/2023  FINDINGS: Five non-rib-bearing lumbar vertebra. Broad-based levo scoliotic curvature. 4-5 mm anterolisthesis of L3 on L4 and 78 mm anterolisthesis of L4 on L5. No change in alignment on flexion or extension. No evidence of abnormal motion. Diffuse disc space narrowing and spurring from L1-L2 through L5-S1. Vacuum phenomenon noted at L5-S1. Diffuse facet hypertrophy that is most prominent at L3-L4 and L4-L5. No evidence of fracture or pars defects. Aortic atherosclerosis with vascular stent and surgical clips in the mid abdomen.  IMPRESSION: 1. Broad-based levo scoliotic curvature with diffuse degenerative disc disease and facet hypertrophy. 2. Grade 1 anterolisthesis of L3 on L4 and L4 on L5. No change in alignment on flexion or extension. 3. Aortic Atherosclerosis (ICD10-I70.0).   Electronically Signed By: Narda Rutherford M.D. On: 08/19/2023 11:47  Hip Imaging: Hip-B DG Bilateral (5V): Results for orders placed during the hospital encounter of 08/17/23 DG HIPS BILAT W OR W/O PELVIS MIN 5 VIEWS  Narrative CLINICAL DATA:  Chronic bilateral hip pain.  EXAM: DG HIP (WITH OR WITHOUT PELVIS) 5+V BILAT  COMPARISON:  None Available.  FINDINGS: Hip joint spaces are preserved. There is mild acetabular spurring. No evidence of erosion or avascular necrosis. No evidence of fracture, pubic rami and bony pelvis are intact. Slight degenerative spurring of the sacroiliac joints. These a pathic changes noted about the iliac crests. Vascular  calcifications.  IMPRESSION: Mild degenerative spurring of both hips.   Electronically Signed By: Narda Rutherford M.D. On: 08/19/2023 11:50  Knee Imaging: Knee-R DG 4 views: Results for orders placed during the hospital encounter of 08/17/23 DG Knee Complete 4 Views Right  Narrative CLINICAL DATA:  Chronic bilateral knee pain. Right knee pain/arthralgia.  EXAM: RIGHT KNEE - COMPLETE 4+ VIEW  COMPARISON:  None Available.  FINDINGS: Moderate medial tibiofemoral joint space narrowing. Mild to moderate tricompartmental peripheral spurring. Possible subchondral cystic change in the central patella. Small knee joint effusion. No fracture, erosion, or focal bone abnormality. Peripheral vascular calcifications. Faint chondrocalcinosis.  IMPRESSION: 1. 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 2. Small knee joint effusion. 3. Faint chondrocalcinosis.   Electronically Signed By: Narda Rutherford M.D. On: 08/19/2023 11:51  Knee-L DG 4 views: Results for orders placed during the hospital encounter of 08/17/23 DG Knee Complete 4 Views Left  Narrative CLINICAL DATA:  Chronic bilateral knee pain. Left knee pain/arthralgia.  EXAM: LEFT KNEE - COMPLETE 4+ VIEW  COMPARISON:  None Available.  FINDINGS: The joint spaces are preserved. There is mild tricompartmental peripheral spurring. Possible subchondral cystic change in the central patella. No significant knee joint effusion. No fracture, erosion, or focal bone abnormality. Minimal vascular calcifications.  IMPRESSION: Mild tricompartmental osteoarthritis.   Electronically Signed By: Narda Rutherford M.D. On: 08/19/2023 11:52  Complexity Note: Imaging results reviewed.                         Meds   Current Outpatient Medications:    acetaminophen (TYLENOL) 650 MG CR tablet, Take 1 tablet (650 mg total) by mouth every 8 (eight) hours as needed for pain., Disp: 20 tablet,  Rfl: 1   aspirin EC 81 MG tablet, Take 81 mg by mouth daily. Swallow whole., Disp: , Rfl:    atorvastatin (LIPITOR) 40 MG tablet, TAKE 1 TABLET BY MOUTH ONCE DAILY AT  6PM.  CHOLESTEROL  MEDICATION., Disp: 90 tablet, Rfl: 1   calcitRIOL (ROCALTROL) 0.25 MCG capsule, Take 0.25 mcg by mouth daily., Disp: , Rfl:    Cholecalciferol 125 MCG (5000 UT) TABS, Take 5,000 Units by mouth daily., Disp: , Rfl:    clopidogrel (PLAVIX) 75 MG tablet, TAKE 1 TABLET BY MOUTH ONCE DAILY .  BLOOD  THINNER  TO  HELP  REDUCE  STROKE  RISK, Disp: 90 tablet, Rfl: 1   ezetimibe (ZETIA) 10 MG tablet, Take 1 tablet by mouth once daily, Disp: 90 tablet, Rfl: 1   labetalol (NORMODYNE) 300 MG tablet, Take 1 tablet (300 mg total) by mouth 2 (two) times daily., Disp: 180 tablet, Rfl: 0   latanoprost (XALATAN) 0.005 % ophthalmic solution, Place 1 drop into both eyes at bedtime., Disp: , Rfl:    levothyroxine (SYNTHROID) 75 MCG tablet, Take 1 tablet (75 mcg total) by mouth daily before breakfast., Disp: 90 tablet, Rfl: 1   senna-docusate (SENOKOT-S) 8.6-50 MG tablet, Take 1 tablet by mouth at bedtime as needed for mild constipation., Disp: 60 tablet, Rfl: 0  ROS  Constitutional: Denies any fever or chills Gastrointestinal: No reported hemesis, hematochezia, vomiting, or acute GI distress Musculoskeletal: Denies any acute onset joint swelling, redness, loss of ROM, or weakness Neurological: No reported episodes of acute onset apraxia, aphasia, dysarthria, agnosia, amnesia, paralysis, loss of coordination, or loss of consciousness  Allergies  Ms. Benedix is allergic to ace inhibitors, ciprofloxacin, citalopram, hydrocodone-acetaminophen, norvasc [amlodipine besylate], pantoprazole sodium, pravastatin, sulfa antibiotics, and clonidine.  PFSH  Drug: Ms. Stinebaugh  reports no history of drug use. Alcohol:  reports no history of alcohol use. Tobacco:  reports that she quit smoking about 24 years ago. Her smoking use included  cigarettes. She started smoking about 45 years ago. She has a 10 pack-year smoking history. She has never used smokeless tobacco. Medical:  has a past medical history of Arthrosis of knee, Chronic kidney disease, Hyperlipidemia, Hypertension, Loss of hearing, Metabolic syndrome, Plantar fasciitis, and Renal insufficiency. Surgical: Ms. Ulatowski  has a past surgical history that includes Abdominal aortic aneurysm repair; Thyroidectomy; Inner ear surgery; Abdominal hysterectomy; Appendectomy; Hernia repair; LEFT HEART CATH AND  CORONARY ANGIOGRAPHY (N/A, 10/26/2016); CORONARY STENT INTERVENTION (N/A, 10/26/2016); and RENAL ANGIOGRAPHY (N/A, 06/11/2021). Family: family history includes Cerebral aneurysm in her father; Healthy in her mother.  Constitutional Exam  General appearance: Well nourished, well developed, and well hydrated. In no apparent acute distress Vitals:   09/12/23 1023 09/12/23 1029  BP: (!) 172/91 (!) 190/94  Pulse: (!) 59   Resp: 16   Temp: 98 F (36.7 C)   TempSrc: Temporal   SpO2: 100%   Weight: 171 lb (77.6 kg)   Height: 5\' 5"  (1.651 m)    BMI Assessment: Estimated body mass index is 28.46 kg/m as calculated from the following:   Height as of this encounter: 5\' 5"  (1.651 m).   Weight as of this encounter: 171 lb (77.6 kg).  BMI interpretation table: BMI level Category Range association with higher incidence of chronic pain  <18 kg/m2 Underweight   18.5-24.9 kg/m2 Ideal body weight   25-29.9 kg/m2 Overweight Increased incidence by 20%  30-34.9 kg/m2 Obese (Class I) Increased incidence by 68%  35-39.9 kg/m2 Severe obesity (Class II) Increased incidence by 136%  >40 kg/m2 Extreme obesity (Class III) Increased incidence by 254%   Patient's current BMI Ideal Body weight  Body mass index is 28.46 kg/m. Ideal body weight: 57 kg (125 lb 10.6 oz) Adjusted ideal body weight: 65.2 kg (143 lb 12.8 oz)   BMI Readings from Last 4 Encounters:  09/12/23 28.46 kg/m  08/17/23 28.46  kg/m  07/08/23 29.42 kg/m  06/28/23 29.78 kg/m   Wt Readings from Last 4 Encounters:  09/12/23 171 lb (77.6 kg)  08/17/23 171 lb (77.6 kg)  07/08/23 171 lb 6.4 oz (77.7 kg)  06/28/23 173 lb 8 oz (78.7 kg)    Psych/Mental status: Alert, oriented x 3 (person, place, & time)       Eyes: PERLA Respiratory: No evidence of acute respiratory distress  Assessment & Plan  Primary Diagnosis & Pertinent Problem List: The primary encounter diagnosis was Chronic low back pain (1ry area of Pain) (Bilateral) w/o sciatica. Diagnoses of Herniated nucleus pulposus, L2-3 (Right), Chronic lower extremity pain (2ry area of Pain) (Bilateral), Chronic hip pain (3ry area of Pain) (Bilateral), Chronic knee pain (4th area of Pain) (Bilateral), Chronic neck pain (5th area of Pain) (Bilateral), Abnormal MRI, lumbar spine (06/25/2023), and Chronic anticoagulation (Plavix) were also pertinent to this visit. Visit Diagnosis: 1. Chronic low back pain (1ry area of Pain) (Bilateral) w/o sciatica   2. Herniated nucleus pulposus, L2-3 (Right)   3. Chronic lower extremity pain (2ry area of Pain) (Bilateral)   4. Chronic hip pain (3ry area of Pain) (Bilateral)   5. Chronic knee pain (4th area of Pain) (Bilateral)   6. Chronic neck pain (5th area of Pain) (Bilateral)   7. Abnormal MRI, lumbar spine (06/25/2023)   8. 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 Considering epidural steroid injections after halting Plavix with the prescribing physician's approval is advised due to the  potential for bleeding. Benefits of this procedure include possible pain relief, with alternatives being conservative management and surgical  options should symptoms escalate. X-ray copies and information on spondylolisthesis will be provided to assist in understanding the condition.  Lumbar Degenerative Disc Disease with Anterolisthesis   Degenerative disc disease and anterolisthesis at L3 over L4 and L4 over L5 are causing central canal narrowing, potentially leading to nerve irritation and pain in the lower back and legs. Epidural steroid injections at the lumbar level are considered, bearing in mind the risks of bleeding, infection, and nerve damage, against the backdrop of potential pain relief. Conservative management and surgical options remain alternatives if there is symptom progression. Information on lumbar spine issues will be provided to aid in decision-making.  Shoulder Arthritis   Arthritis in the acromioclavicular joint and the main shoulder joint is evident from X-rays, contributing to shoulder pain, which may also have multifactorial causes including nerve issues from the neck. Treatment options for shoulder arthritis are under consideration, with an understanding that pain relief may be partial due to the multifactorial nature of the pain. Conservative management and surgical options are alternatives if the condition worsens.  Chronic Kidney Disease   Elevated blood glucose, BUN, and creatinine levels alongside a low glomerular filtration rate indicate chronic kidney disease. Regular monitoring of kidney function is essential, and the potential need for a nephrology referral will be discussed with her, acknowledging her awareness of her kidney issues.  General Health Maintenance   The chronic nature of her conditions was discussed, setting realistic expectations for improvement with interventional therapies. While complete resolution is unlikely, improvement with treatment is possible.  An after-visit summary with instructions for preparing for injections will be provided. It is crucial not to stop Plavix without the prescribing physician's approval. She is encouraged to call when ready to proceed with treatment.        Pharmacotherapy (Medications Ordered): No orders of the defined types were placed in this encounter.  Procedure Orders    No procedure(s) ordered today   Lab Orders  No laboratory test(s) ordered today   Imaging Orders  No imaging studies ordered today   Referral Orders  No referral(s) requested today    Pharmacological management:  Opioid Analgesics: I will not be prescribing any opioids at this time Membrane stabilizer: I will not be prescribing any at this time Muscle relaxant: I will not be prescribing any at this time NSAID: I will not be prescribing any at this time Other analgesic(s): I will not be prescribing any at this time      Interventional Therapies  Risk Factors  Considerations  Medical Comorbidities:  PLAVIX Anticoagulation: (Stop: 7-10 days  Restart: 2 hours) AAA  HTN  Stage V CKD  Hx. CVA  CAD  Hx. CABG  Hx. MI  Renal Artery Stenosis     Planned  Pending:   Diagnostic right L2-3 LESI #1    Under consideration:   Diagnostic right L2-3 LESI #1    Completed:   None at this time   Therapeutic  Palliative (PRN) options:   None established   Completed by other providers:   None reported       Provider-requested follow-up: Return if symptoms worsen or fail to improve. Recent Visits Date Type Provider Dept  08/17/23 Office Visit Delano Metz, MD [facility]  Showing recent visits within past 90 days and meeting all other requirements Today's Visits Date Type Provider Dept  09/12/23 Office Visit Delano Metz, MD [facility]  Showing today's visits and meeting all other requirements Future Appointments No visits were found  meeting these conditions. Showing future  appointments within next 90 days and meeting all other requirements   Primary Care Physician: Alba Cory, MD  Duration of encounter: 32 minutes.  Total time on encounter, as per AMA guidelines included both the face-to-face and non-face-to-face time personally spent by the physician and/or other qualified health care professional(s) on the day of the encounter (includes time in activities that require the physician or other qualified health care professional and does not include time in activities normally performed by clinical staff). Physician's time may include the following activities when performed: Preparing to see the patient (e.g., pre-charting review of records, searching for previously ordered imaging, lab work, and nerve conduction tests) Review of prior analgesic pharmacotherapies. Reviewing PMP Interpreting ordered tests (e.g., lab work, imaging, nerve conduction tests) Performing post-procedure evaluations, including interpretation of diagnostic procedures Obtaining and/or reviewing separately obtained history Performing a medically appropriate examination and/or evaluation Counseling and educating the patient/family/caregiver Ordering medications, tests, or procedures Referring and communicating with other health care professionals (when not separately reported) Documenting clinical information in the electronic or other health record Independently interpreting results (not separately reported) and communicating results to the patient/ family/caregiver Care coordination (not separately reported)  Note by: Oswaldo Done, MD (TTS technology used. I apologize for any typographical errors that were not detected and corrected.) Date: 09/12/2023; Time: 12:34 PM

## 2023-09-12 ENCOUNTER — Encounter: Payer: Self-pay | Admitting: Pain Medicine

## 2023-09-12 ENCOUNTER — Ambulatory Visit: Payer: Medicare Other | Attending: Pain Medicine | Admitting: Pain Medicine

## 2023-09-12 VITALS — BP 190/94 | HR 59 | Temp 98.0°F | Resp 16 | Ht 65.0 in | Wt 171.0 lb

## 2023-09-12 DIAGNOSIS — M25561 Pain in right knee: Secondary | ICD-10-CM | POA: Diagnosis present

## 2023-09-12 DIAGNOSIS — M25552 Pain in left hip: Secondary | ICD-10-CM | POA: Diagnosis present

## 2023-09-12 DIAGNOSIS — M545 Low back pain, unspecified: Secondary | ICD-10-CM | POA: Diagnosis present

## 2023-09-12 DIAGNOSIS — M542 Cervicalgia: Secondary | ICD-10-CM | POA: Diagnosis present

## 2023-09-12 DIAGNOSIS — M79604 Pain in right leg: Secondary | ICD-10-CM | POA: Diagnosis present

## 2023-09-12 DIAGNOSIS — M25562 Pain in left knee: Secondary | ICD-10-CM | POA: Insufficient documentation

## 2023-09-12 DIAGNOSIS — G8929 Other chronic pain: Secondary | ICD-10-CM | POA: Diagnosis present

## 2023-09-12 DIAGNOSIS — Z7901 Long term (current) use of anticoagulants: Secondary | ICD-10-CM

## 2023-09-12 DIAGNOSIS — M5126 Other intervertebral disc displacement, lumbar region: Secondary | ICD-10-CM

## 2023-09-12 DIAGNOSIS — M79605 Pain in left leg: Secondary | ICD-10-CM | POA: Diagnosis present

## 2023-09-12 DIAGNOSIS — R937 Abnormal findings on diagnostic imaging of other parts of musculoskeletal system: Secondary | ICD-10-CM

## 2023-09-12 DIAGNOSIS — M25551 Pain in right hip: Secondary | ICD-10-CM | POA: Insufficient documentation

## 2023-09-12 NOTE — Progress Notes (Signed)
Safety precautions to be maintained throughout the outpatient stay will include: orient to surroundings, keep bed in low position, maintain call bell within reach at all times, provide assistance with transfer out of bed and ambulation.   Pt advised to notify PCP of high BP readings today.

## 2023-09-12 NOTE — Patient Instructions (Addendum)
______________________________________________________________________    Blood Thinners  IMPORTANT NOTICE:  If you take any of these, make sure to notify the nursing staff.  Failure to do so may result in serious injury.  Recommended time intervals to stop and restart blood-thinners, before & after invasive procedures  Generic Name Brand Name Pre-procedure: Stop medication for this amount of time before your procedure: Post-procedure: Wait this amount of time after the procedure before restarting your medication:  Abciximab Reopro 15 days 2 hrs  Alteplase Activase 10 days 10 days  Anagrelide Agrylin    Apixaban Eliquis 3 days 6 hrs  Cilostazol Pletal 3 days 5 hrs  Clopidogrel Plavix 7-10 days 2 hrs  Dabigatran Pradaxa 5 days 6 hrs  Dalteparin Fragmin 24 hours 4 hrs  Dipyridamole Aggrenox 11days 2 hrs  Edoxaban Lixiana; Savaysa 3 days 2 hrs  Enoxaparin  Lovenox 24 hours 4 hrs  Eptifibatide Integrillin 8 hours 2 hrs  Fondaparinux  Arixtra 72 hours 12 hrs  Hydroxychloroquine Plaquenil 11 days   Prasugrel Effient 7-10 days 6 hrs  Reteplase Retavase 10 days 10 days  Rivaroxaban Xarelto 3 days 6 hrs  Ticagrelor Brilinta 5-7 days 6 hrs  Ticlopidine Ticlid 10-14 days 2 hrs  Tinzaparin Innohep 24 hours 4 hrs  Tirofiban Aggrastat 8 hours 2 hrs  Warfarin Coumadin 5 days 2 hrs   Other medications with blood-thinning effects  Product indications Generic (Brand) names Note  Cholesterol Lipitor Stop 4 days before procedure  Blood thinner (injectable) Heparin (LMW or LMWH Heparin) Stop 24 hours before procedure  Cancer Ibrutinib (Imbruvica) Stop 7 days before procedure  Malaria/Rheumatoid Hydroxychloroquine (Plaquenil) Stop 11 days before procedure  Thrombolytics  10 days before or after procedures   Over-the-counter (OTC) Products with blood-thinning effects  Product Common names Stop Time  Aspirin > 325 mg Goody Powders, Excedrin, etc. 11 days  Aspirin <= 81 mg  7 days  Fish oil   4 days  Garlic supplements  7 days  Ginkgo biloba  36 hours  Ginseng  24 hours  NSAIDs Ibuprofen, Naprosyn, etc. 3 days  Vitamin E  4 days   ______________________________________________________________________      ______________________________________________________________________    General Risks and Possible Complications  Patient Responsibilities: It is important that you read this as it is part of your informed consent. It is our duty to inform you of the risks and possible complications associated with treatments offered to you. It is your responsibility as a patient to read this and to ask questions about anything that is not clear or that you believe was not covered in this document.  Patient's Rights: You have the right to refuse treatment. You also have the right to change your mind, even after initially having agreed to have the treatment done. However, under this last option, if you wait until the last second to change your mind, you may be charged for the materials used up to that point.  Introduction: Medicine is not an Visual merchandiser. Everything in Medicine, including the lack of treatment(s), carries the potential for danger, harm, or loss (which is by definition: Risk). In Medicine, a complication is a secondary problem, condition, or disease that can aggravate an already existing one. All treatments carry the risk of possible complications. The fact that a side effects or complications occurs, does not imply that the treatment was conducted incorrectly. It must be clearly understood that these can happen even when everything is done following the highest safety standards.  No treatment: You can  choose not to proceed with the proposed treatment alternative. The "PRO(s)" would include: avoiding the risk of complications associated with the therapy. The "CON(s)" would include: not getting any of the treatment benefits. These benefits fall under one of three categories:  diagnostic; therapeutic; and/or palliative. Diagnostic benefits include: getting information which can ultimately lead to improvement of the disease or symptom(s). Therapeutic benefits are those associated with the successful treatment of the disease. Finally, palliative benefits are those related to the decrease of the primary symptoms, without necessarily curing the condition (example: decreasing the pain from a flare-up of a chronic condition, such as incurable terminal cancer).  General Risks and Complications: These are associated to most interventional treatments. They can occur alone, or in combination. They fall under one of the following six (6) categories: no benefit or worsening of symptoms; bleeding; infection; nerve damage; allergic reactions; and/or death. No benefits or worsening of symptoms: In Medicine there are no guarantees, only probabilities. No healthcare provider can ever guarantee that a medical treatment will work, they can only state the probability that it may. Furthermore, there is always the possibility that the condition may worsen, either directly, or indirectly, as a consequence of the treatment. Bleeding: This is more common if the patient is taking a blood thinner, either prescription or over the counter (example: Goody Powders, Fish oil, Aspirin, Garlic, etc.), or if suffering a condition associated with impaired coagulation (example: Hemophilia, cirrhosis of the liver, low platelet counts, etc.). However, even if you do not have one on these, it can still happen. If you have any of these conditions, or take one of these drugs, make sure to notify your treating physician. Infection: This is more common in patients with a compromised immune system, either due to disease (example: diabetes, cancer, human immunodeficiency virus [HIV], etc.), or due to medications or treatments (example: therapies used to treat cancer and rheumatological diseases). However, even if you do not have  one on these, it can still happen. If you have any of these conditions, or take one of these drugs, make sure to notify your treating physician. Nerve Damage: This is more common when the treatment is an invasive one, but it can also happen with the use of medications, such as those used in the treatment of cancer. The damage can occur to small secondary nerves, or to large primary ones, such as those in the spinal cord and brain. This damage may be temporary or permanent and it may lead to impairments that can range from temporary numbness to permanent paralysis and/or brain death. Allergic Reactions: Any time a substance or material comes in contact with our body, there is the possibility of an allergic reaction. These can range from a mild skin rash (contact dermatitis) to a severe systemic reaction (anaphylactic reaction), which can result in death. Death: In general, any medical intervention can result in death, most of the time due to an unforeseen complication. ______________________________________________________________________      ______________________________________________________________________    Procedure instructions  Stop blood-thinners  Do not eat or drink fluids (other than water) for 6 hours before your procedure  No water for 2 hours before your procedure  Take your blood pressure medicine with a sip of water  Arrive 30 minutes before your appointment  If sedation is planned, bring suitable driver. Pennie Banter, Benedetto Goad, & public transportation are NOT APPROVED)  Carefully read the "Preparing for your procedure" detailed instructions  If you have questions call us at [phone]  Procedure appointments  are for procedures only. NO medication refills or new problem evaluations.   ______________________________________________________________________      ______________________________________________________________________    Preparing for your  procedure  Appointments: If you think you may not be able to keep your appointment, call 24-48 hours in advance to cancel. We need time to make it available to others.  Procedure visits are for procedures only. During your procedure appointment there will be: NO Prescription Refills*. NO medication changes or discussions*. NO discussion of disability issues*. NO unrelated pain problem evaluations*. NO evaluations to order other pain procedures*. *These will be addressed at a separate and distinct evaluation encounter on the provider's evaluation schedule and not during procedure days.  Instructions: Food intake: Avoid eating anything solid for at least 8 hours prior to your procedure. Clear liquid intake: You may take clear liquids such as water up to 2 hours prior to your procedure. (No carbonated drinks. No soda.) Transportation: Unless otherwise stated by your physician, bring a driver. (Driver cannot be a Market researcher, Pharmacist, community, or any other form of public transportation.) Morning Medicines: Except for blood thinners, take all of your other morning medications with a sip of water. Make sure to take your heart and blood pressure medicines. If your blood pressure's lower number is above 100, the case will be rescheduled. Blood thinners: Make sure to stop your blood thinners as instructed.  If you take a blood thinner, but were not instructed to stop it, call our office [phone] and ask to talk to a nurse. Not stopping a blood thinner prior to certain procedures could lead to serious complications. Diabetics on insulin: Notify the staff so that you can be scheduled 1st case in the morning. If your diabetes requires high dose insulin, take only  of your normal insulin dose the morning of the procedure and notify the staff that you have done so. Preventing infections: Shower with an antibacterial soap the morning of your procedure.  Build-up your immune system: Take 1000 mg of Vitamin C with every  meal (3 times a day) the day prior to your procedure. Antibiotics: Inform the nursing staff if you are taking any antibiotics or if you have any conditions that may require antibiotics prior to procedures. (Example: recent joint implants)   Pregnancy: If you are pregnant make sure to notify the nursing staff. Not doing so may result in injury to the fetus, including death.  Sickness: If you have a cold, fever, or any active infections, call and cancel or reschedule your procedure. Receiving steroids while having an infection may result in complications. Arrival: You must be in the facility at least 30 minutes prior to your scheduled procedure. Tardiness: Your scheduled time is also the cutoff time. If you do not arrive at least 15 minutes prior to your procedure, you will be rescheduled.  Children: Do not bring any children with you. Make arrangements to keep them home. Dress appropriately: There is always a possibility that your clothing may get soiled. Avoid long dresses. Valuables: Do not bring any jewelry or valuables.  Reasons to call and reschedule or cancel your procedure: (Following these recommendations will minimize the risk of a serious complication.) Surgeries: Avoid having procedures within 2 weeks of any surgery. (Avoid for 2 weeks before or after any surgery). Flu Shots: Avoid having procedures within 2 weeks of a flu shots or . (Avoid for 2 weeks before or after immunizations). Barium: Avoid having a procedure within 7-10 days after having had a radiological study involving the  use of radiological contrast. (Myelograms, Barium swallow or enema study). Heart attacks: Avoid any elective procedures or surgeries for the initial 6 months after a "Myocardial Infarction" (Heart Attack). Blood thinners: It is imperative that you stop these medications before procedures. Let us know if you if you take any blood thinner.  Infection: Avoid procedures during or within two weeks of an infection  (including chest colds or gastrointestinal problems). Symptoms associated with infections include: Localized redness, fever, chills, night sweats or profuse sweating, burning sensation when voiding, cough, congestion, stuffiness, runny nose, sore throat, diarrhea, nausea, vomiting, cold or Flu symptoms, recent or current infections. It is specially important if the infection is over the area that we intend to treat. Heart and lung problems: Symptoms that may suggest an active cardiopulmonary problem include: cough, chest pain, breathing difficulties or shortness of breath, dizziness, ankle swelling, uncontrolled high or unusually low blood pressure, and/or palpitations. If you are experiencing any of these symptoms, cancel your procedure and contact your primary care physician for an evaluation.  Remember:  Regular Business hours are:  Monday to Thursday 8:00 AM to 4:00 PM  Provider's Schedule: Delano Metz, MD:  Procedure days: Tuesday and Thursday 7:30 AM to 4:00 PM  Edward Jolly, MD:  Procedure days: Monday and Wednesday 7:30 AM to 4:00 PM Last  Updated: 08/30/2023 ______________________________________________________________________      ____________________________________________________________________________________________  Spondylolisthesis  Spondylolisthesis is a condition that occurs when a vertebra in the spine slips out of place, usually in the lower back. Symptoms can vary from mild to severe, and a person may have no symptoms.  Some common symptoms include:  Back pain, especially chronic pain  Pain that radiates down the legs  Pain that worsens with exercise  Tightness in the hamstrings  Neck stiffness  Loss of spine flexibility  Weakness in the legs or trouble walking  Numbness and tingling in the groin and/or buttocks   Some causes of spondylolisthesis include: Birth defects. Sudden injury. Abnormal wear on the cartilage and bones, such as arthritis. Bone  disease and fractures. Certain sports activities, such as gymnastics, weightlifting, and football.  A doctor can diagnose spondylolisthesis with a physical exam, X-rays, and possibly a CT scan.    Forward slippage is known as "Anterolisthesis".  Backward slippage is known as "Retrolisthesis".   Pathophysiology of Spondylolisthesis:   Grading Classification of Spondylolisthesis Grade I spondylolisthesis is 1 to 25% slippage, grade II is up to 50% slippage, grade III is up to 75% slippage, and grade IV is 76-100% slippage. If there is more than 100% slippage, it is known as spondyloptosis or grade V spondylolisthesis.       ____________________________________________________________________________________________

## 2023-11-14 ENCOUNTER — Emergency Department
Admission: EM | Admit: 2023-11-14 | Discharge: 2023-11-15 | Disposition: A | Discharge status: Home / Self Care | Payer: Medicare Other | Attending: Emergency Medicine | Admitting: Emergency Medicine

## 2023-11-14 ENCOUNTER — Other Ambulatory Visit: Payer: Self-pay

## 2023-11-14 DIAGNOSIS — R197 Diarrhea, unspecified: Secondary | ICD-10-CM | POA: Insufficient documentation

## 2023-11-14 DIAGNOSIS — I120 Hypertensive chronic kidney disease with stage 5 chronic kidney disease or end stage renal disease: Secondary | ICD-10-CM | POA: Insufficient documentation

## 2023-11-14 DIAGNOSIS — R531 Weakness: Secondary | ICD-10-CM | POA: Insufficient documentation

## 2023-11-14 DIAGNOSIS — R112 Nausea with vomiting, unspecified: Secondary | ICD-10-CM | POA: Diagnosis not present

## 2023-11-14 DIAGNOSIS — N185 Chronic kidney disease, stage 5: Secondary | ICD-10-CM | POA: Diagnosis not present

## 2023-11-14 LAB — CBC
HCT: 27 % — ABNORMAL LOW (ref 36.0–46.0)
Hemoglobin: 8.8 g/dL — ABNORMAL LOW (ref 12.0–15.0)
MCH: 31 pg (ref 26.0–34.0)
MCHC: 32.6 g/dL (ref 30.0–36.0)
MCV: 95.1 fL (ref 80.0–100.0)
Platelets: 181 10*3/uL (ref 150–400)
RBC: 2.84 MIL/uL — ABNORMAL LOW (ref 3.87–5.11)
RDW: 12.5 % (ref 11.5–15.5)
WBC: 11.2 10*3/uL — ABNORMAL HIGH (ref 4.0–10.5)
nRBC: 0 % (ref 0.0–0.2)

## 2023-11-14 LAB — HEPATIC FUNCTION PANEL
ALT: 12 U/L (ref 0–44)
AST: 25 U/L (ref 15–41)
Albumin: 3.2 g/dL — ABNORMAL LOW (ref 3.5–5.0)
Alkaline Phosphatase: 57 U/L (ref 38–126)
Bilirubin, Direct: 0.1 mg/dL (ref 0.0–0.2)
Indirect Bilirubin: 0.7 mg/dL (ref 0.3–0.9)
Total Bilirubin: 0.8 mg/dL (ref 0.0–1.2)
Total Protein: 6 g/dL — ABNORMAL LOW (ref 6.5–8.1)

## 2023-11-14 LAB — BASIC METABOLIC PANEL
Anion gap: 14 (ref 5–15)
BUN: 63 mg/dL — ABNORMAL HIGH (ref 8–23)
CO2: 18 mmol/L — ABNORMAL LOW (ref 22–32)
Calcium: 9.4 mg/dL (ref 8.9–10.3)
Chloride: 100 mmol/L (ref 98–111)
Creatinine, Ser: 4.21 mg/dL — ABNORMAL HIGH (ref 0.44–1.00)
GFR, Estimated: 10 mL/min — ABNORMAL LOW (ref >60)
Glucose, Bld: 123 mg/dL — ABNORMAL HIGH (ref 70–99)
Potassium: 4.7 mmol/L (ref 3.5–5.1)
Sodium: 132 mmol/L — ABNORMAL LOW (ref 135–145)

## 2023-11-14 MED ORDER — ONDANSETRON HCL 4 MG/2ML IJ SOLN: 4.0000 mg | Freq: Once | INTRAMUSCULAR | Status: DC

## 2023-11-14 MED ORDER — LABETALOL HCL 200 MG PO TABS
300.0000 mg | ORAL_TABLET | Freq: Once | ORAL | Status: AC
  Administered 2023-11-15: 300 mg via ORAL
  Filled 2023-11-14: qty 1

## 2023-11-14 MED ORDER — LACTATED RINGERS IV BOLUS: 1000.0000 mL | Freq: Once | INTRAVENOUS | Status: DC

## 2023-11-14 NOTE — ED Provider Notes (Signed)
 Betty Nielsen Provider Note    Event Date/Time   First MD Initiated Contact with Patient 11/14/23 2259     (approximate)   History   Weakness   HPI  Betty Nielsen is a 78 y.o. female with history of CKD, hypertension, hyperlipidemia, presenting with generalized weakness, nausea, vomiting, diarrhea for the last couple days.  She denies any chest pain or shortness of breath.  Has not taken her home blood pressure medications.  Per her friend, she has not eaten anything since yesterday.  No sick contacts.  No fevers, cough, urinary symptoms.  She denies any back pain.  No weakness or numbness.     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  Physical Exam   Triage Vital Signs: ED Triage Vitals  Encounter Vitals Group     BP 11/14/23 1827 (!) 157/93     Systolic BP Percentile --      Diastolic BP Percentile --      Pulse Rate 11/14/23 1827 69     Resp 11/14/23 1827 18     Temp 11/14/23 1827 98.3 F (36.8 C)     Temp Source 11/14/23 1827 Oral     SpO2 11/14/23 1827 97 %     Weight --      Height --      Head Circumference --      Peak Flow --      Pain Score 11/14/23 1828 0     Pain Loc --      Pain Education --      Exclude from Growth Chart --     Most recent vital signs: Vitals:   11/15/23 0300 11/15/23 0400  BP: (!) 186/75 (!) 191/91  Pulse: 65 65  Resp: (!) 28 18  Temp:    SpO2: 95% 98%     General: Awake, no distress.  CV:  Good peripheral perfusion.  Resp:  Normal effort.  Abd:  No distention.  Soft nontender to deep palpation Other:  No CVA tenderness, moving all 4 extremities without focal weakness or numbness, no facial droop.  She is alert, mildly dry on exam.   ED Results / Procedures / Treatments   Labs (all labs ordered are listed, but only abnormal results are  displayed) Labs Reviewed  BASIC METABOLIC PANEL - Abnormal; Notable for the following components:      Result Value   Sodium 132 (*)    CO2 18 (*)    Glucose, Bld 123 (*)    BUN 63 (*)    Creatinine, Ser 4.21 (*)    GFR, Estimated 10 (*)    All other components within normal limits  CBC - Abnormal; Notable for the following components:   WBC 11.2 (*)    RBC 2.84 (*)    Hemoglobin 8.8 (*)    HCT 27.0 (*)    All other components within normal limits  URINALYSIS, W/ REFLEX TO CULTURE (INFECTION SUSPECTED) - Abnormal; Notable for the following components:   Color, Urine YELLOW (*)    APPearance CLEAR (*)    Hgb urine dipstick MODERATE (*)    Protein, ur >=300 (*)    All other components within normal limits  HEPATIC FUNCTION PANEL - Abnormal; Notable for the following components:   Total Protein 6.0 (*)    Albumin  3.2 (*)    All other components within normal limits  RESP PANEL BY RT-PCR (RSV, FLU A&B, COVID)  [id number]  GASTROINTESTINAL PANEL BY PCR, STOOL (REPLACES STOOL CULTURE)  C DIFFICILE QUICK SCREEN W PCR REFLEX    TROPONIN I (HIGH SENSITIVITY)     EKG  EKG shows sinus rhythm, rate 74, normal QRS, normal QTc, T wave flattening in 1, aVL, no ischemic ST elevation, not significant change compared to prior   PROCEDURES:  Critical Care performed: No  Procedures   MEDICATIONS ORDERED IN ED: Medications  lactated ringers bolus 1,000 mL (0 mLs Intravenous Hold 11/15/23 0009)  labetalol (NORMODYNE) tablet 300 mg (300 mg Oral Given 11/15/23 0021)  ondansetron (ZOFRAN-ODT) disintegrating tablet 4 mg (4 mg Oral Given 11/15/23 0015)     IMPRESSION / MDM / ASSESSMENT AND PLAN / ED COURSE  I reviewed the triage vital signs and the nursing notes.                              Differential diagnosis includes, but is not limited to, viral gastroenteritis, norovirus, influenza, considered colitis, diverticulitis but patient has no abdominal pain on exam.  Also consider UTI, no  CVA tenderness to suggest pyelonephritis.  Also considered some mild dehydration, electrolyte derangements.  Also consider atypical ACS.  Will get labs, EKG, troponin, viral panel, GI panel, C. difficile.  Patient is able to tolerate p.o., will orally hydrate her as well.  Patient's presentation is most consistent with acute presentation with potential threat to life or bodily function.  Independent review of labs are below.  Gave patient her home below that she missed today.  On reassessment patient was sleeping, woke her up to discussed laboratory results with her.  She is asymptomatic at this time, no tenderness on palpation to her abdomen.  Shared decision making and she is ready for discharge.  Instructed her to keep yourself hydrated and follow-up with her primary care doctor for further management outpatient.  Considered but no indication for inpatient admission at this time, she is safe for outpatient management.  Will discharge with strict precautions.  Clinical Course as of 11/15/23 0428  Tue Nov 15, 2023  0420 Independent review of labs, troponin is negative, her creatinine is elevated but this is downtrending compared to prior, mild hyponatremia but otherwise electrolytes not severely deranged, LFTs are normal, mild leukocytosis, respiratory viral panel is negative, UA is not consistent with UTI. [TT]  0421 Patient is abdomen is soft and nontender, with history of nausea, vomiting, diarrhea, considered that this could be a viral gastroenteritis. [TT]    Clinical Course User Index [TT] Jodie Echevaria Franchot Erichsen, MD     FINAL CLINICAL IMPRESSION(S) / ED DIAGNOSES   Final diagnoses:  Weakness  Nausea vomiting and diarrhea     Rx / DC Orders   ED Discharge Orders     None        Note:  This document was prepared using Dragon voice recognition software and may include unintentional dictation errors.    Claybon Jabs, MD 11/15/23 [phone]

## 2023-11-14 NOTE — ED Triage Notes (Signed)
 Patient presents to ED for generalized weakness, N/V/D. Per friend, she hasn't eaten since yesterday.   Denies sick contacts.

## 2023-11-15 LAB — URINALYSIS, W/ REFLEX TO CULTURE (INFECTION SUSPECTED)
Bacteria, UA: NONE SEEN
Bilirubin Urine: NEGATIVE
Glucose, UA: NEGATIVE mg/dL
Ketones, ur: NEGATIVE mg/dL
Leukocytes,Ua: NEGATIVE
Nitrite: NEGATIVE
Protein, ur: >=300 mg/dL — AB
Specific Gravity, Urine: 1.016 (ref 1.005–1.030)
Squamous Epithelial / HPF: 0 /[HPF] (ref 0–5)
pH: 5 (ref 5.0–8.0)

## 2023-11-15 LAB — RESP PANEL BY RT-PCR (RSV, FLU A&B, COVID)  RVPGX2
Influenza A by PCR: NEGATIVE
Influenza B by PCR: NEGATIVE
Resp Syncytial Virus by PCR: NEGATIVE
SARS Coronavirus 2 by RT PCR: NEGATIVE

## 2023-11-15 LAB — TROPONIN I (HIGH SENSITIVITY): Troponin I (High Sensitivity): 16 ng/L (ref <18)

## 2023-11-15 MED ORDER — ONDANSETRON 4 MG PO TBDP
4.0000 mg | ORAL_TABLET | Freq: Once | ORAL | Status: AC
  Administered 2023-11-15: 4 mg via ORAL
  Filled 2023-11-15: qty 1

## 2023-11-15 NOTE — ED Notes (Signed)
 This nurse attempted IV access x2 without success and patient refusing further sticks. MD made aware.

## 2023-11-15 NOTE — ED Notes (Addendum)
 Patient up and to toilet without incident but unable to urinate. Patient x1 assist r/t unsteady balance. Monitor in place. Will continue to monitor. CB in reach.

## 2023-11-15 NOTE — ED Notes (Signed)
 Pt unable to provide stool sample at this time

## 2023-11-15 NOTE — ED Notes (Signed)
 Patient provided fluids for hydration

## 2023-11-15 NOTE — ED Notes (Signed)
 Pt voided and BM in toilet and flushed prior to this nurse ability to collect while being covered by Morrie Sheldon, NRP for break.

## 2023-11-15 NOTE — ED Notes (Signed)
 Pt called family member - SonCaryn Bee for a ride

## 2023-11-19 ENCOUNTER — Other Ambulatory Visit: Payer: Self-pay | Admitting: Family Medicine

## 2023-11-19 DIAGNOSIS — I10 Essential (primary) hypertension: Secondary | ICD-10-CM

## 2023-11-21 NOTE — Telephone Encounter (Signed)
 Last f/u 06/2023

## 2023-12-16 ENCOUNTER — Encounter: Payer: Self-pay | Admitting: Oncology

## 2023-12-30 ENCOUNTER — Encounter: Payer: Self-pay | Admitting: Family Medicine

## 2023-12-30 ENCOUNTER — Ambulatory Visit (INDEPENDENT_AMBULATORY_CARE_PROVIDER_SITE_OTHER): Payer: Self-pay | Admitting: Family Medicine

## 2023-12-30 VITALS — BP 156/88 | HR 62 | Resp 16 | Ht 63.0 in | Wt 157.3 lb

## 2023-12-30 DIAGNOSIS — I209 Angina pectoris, unspecified: Secondary | ICD-10-CM | POA: Diagnosis not present

## 2023-12-30 DIAGNOSIS — I252 Old myocardial infarction: Secondary | ICD-10-CM | POA: Diagnosis not present

## 2023-12-30 DIAGNOSIS — Z9889 Other specified postprocedural states: Secondary | ICD-10-CM

## 2023-12-30 DIAGNOSIS — I700 Atherosclerosis of aorta: Secondary | ICD-10-CM | POA: Diagnosis not present

## 2023-12-30 DIAGNOSIS — Z8639 Personal history of other endocrine, nutritional and metabolic disease: Secondary | ICD-10-CM | POA: Diagnosis not present

## 2023-12-30 DIAGNOSIS — E890 Postprocedural hypothyroidism: Secondary | ICD-10-CM | POA: Diagnosis not present

## 2023-12-30 DIAGNOSIS — M48061 Spinal stenosis, lumbar region without neurogenic claudication: Secondary | ICD-10-CM | POA: Diagnosis not present

## 2023-12-30 DIAGNOSIS — N2581 Secondary hyperparathyroidism of renal origin: Secondary | ICD-10-CM | POA: Diagnosis not present

## 2023-12-30 DIAGNOSIS — Z8673 Personal history of transient ischemic attack (TIA), and cerebral infarction without residual deficits: Secondary | ICD-10-CM | POA: Diagnosis not present

## 2023-12-30 DIAGNOSIS — N185 Chronic kidney disease, stage 5: Secondary | ICD-10-CM | POA: Diagnosis not present

## 2023-12-30 DIAGNOSIS — I10 Essential (primary) hypertension: Secondary | ICD-10-CM | POA: Diagnosis not present

## 2023-12-30 MED ORDER — ATORVASTATIN CALCIUM 40 MG PO TABS: ORAL_TABLET | ORAL | 1 refills | Status: DC

## 2023-12-30 MED ORDER — LABETALOL HCL 300 MG PO TABS: 300.0000 mg | ORAL_TABLET | Freq: Two times a day (BID) | ORAL | 1 refills | Status: DC

## 2023-12-30 MED ORDER — EZETIMIBE 10 MG PO TABS
10.0000 mg | ORAL_TABLET | Freq: Every day | ORAL | 1 refills | Status: DC
Start: 2023-12-30 — End: 2024-07-02

## 2023-12-30 MED ORDER — CLOPIDOGREL BISULFATE 75 MG PO TABS: ORAL_TABLET | ORAL | 1 refills | Status: DC

## 2023-12-30 NOTE — Progress Notes (Signed)
 Name: Betty Nielsen   MRN: [id number]    DOB: 11-05-1945   Date:12/30/2023       Progress Note  Subjective  Chief Complaint  Chief Complaint  Patient presents with   Medical Management of Chronic Issues   HPI   CKI stage V: she is under the care of  [facility], she was hospitalized end of 05/2021 and developed acute on chronic kidney disease,GFR was as low as 7  and last level was 11.  She had a renal artery stenosis, status post stent placement . She is under the care of Dr. Cherylann Ratel. She is still able to void, denies generalized pruritus.    History of iron deficiency anemia: also has anemia of chronic disease ,  last visit with Dr. Cathie Hoops was Dec 2022, she had 3 iron infusions in 2022. She gets HCT monitored by nephrologist. HCT was still low but stable on recent labs   HTN/CAD/Angina/ history of Stroke: bp today is in good control, taking bp medications as prescribed, denies  palpitation or dizziness  She has not used any NTG since Feb 2018 ( when she had MI). She had a Stroke 10/2019 , she is taking  plavix and also takes  Atorvastatin 40 mg  and Zeti 10mg  . She no longer wants to see cardiologist    Atherosclerosis abdominal aorta/hyperlipidemia: she has been taking Atorvastatin, plavix and aspirin ,  history of aneurysmal of aorta repair done by Dr. Durwin Nora ( vascular surgeon in [place]), last visit with [facility] was 05/2022 , she did not go back for follow up and did not have the studies ordered last year. Explained importance of regular follow ups   Familial hypercholesterinemia : since 10/2019 she has been taking Atorvastatin  and Zetia, LDL in 2023 down from 226 to 101 Fall 2024    Hypothyroidism: she is s/p thyroidectomy Compliant with medication, denies dry skin, states constipation under control with dietary modification and increase in fiber diet. Last TSH ws elevated and we will recheck it today    Major Depression in remission : she is off SSRI because of  hyponatremia, but states she is feeling well, denies any sadness at this time. 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    Metabolic syndrome: Last A1C improved from  5.7 % down to 5.5 % . She denies polyphagia, polydipsia or polyuria . We will monitor it yearly   Low back pain: seen by Dr. Idalia Needle, did not want to have steroid injections, she states has mild low back pain but no longer has radiculitis. Does not want to go back to see him. States very costly   Patient Active Problem List   Diagnosis Date Noted   Herniated nucleus pulposus, L2-3 (Right) 09/12/2023   Chronic anticoagulation (Plavix) 08/17/2023   Hard of hearing 08/17/2023   Chronic low back pain (1ry area of Pain) (Bilateral) w/o sciatica 08/17/2023   Chronic lower extremity pain (2ry area of Pain) (Bilateral) 08/17/2023   Weakness of lower extremities (Bilateral) 08/17/2023   Chronic hip pain (3ry area of Pain) (Bilateral) 08/17/2023   Chronic knee pain (4th area of Pain) (Bilateral) 08/17/2023   Numbness and tingling of feet (Bilateral) 08/17/2023   Chronic neck pain (5th area of Pain) (Bilateral) 08/17/2023   Chronic shoulder pain (6th area of Pain) (Bilateral) 08/17/2023   Numbness and tingling in hands (intermittent) (Bilateral) 08/17/2023   Abnormal MRI, lumbar spine (06/25/2023) 08/16/2023   Chronic pain syndrome 08/16/2023  Pharmacologic therapy 08/16/2023   Disorder of skeletal system 08/16/2023   Problems influencing health status 08/16/2023   Spinal stenosis 06/26/2023   Hypertensive urgency 06/25/2023   Metabolic acidosis 06/25/2023   Inadequate pain control 06/25/2023   Benign hypertensive kidney disease with chronic kidney disease 03/09/2023   Proteinuria 03/09/2023   Prediabetes 07/06/2022   Hypertension 07/06/2022   Renal artery stenosis (HCC) 07/14/2021   Hyperkalemia    Accelerated hypertension 06/09/2021   Left upper extremity numbness 06/07/2021   Left facial numbness 06/07/2021    Elevated serum creatinine 06/07/2021   Anemia of chronic disease 11/21/2019   Hypo-osmolality and hyponatremia 11/21/2019   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/21/2019   Chronic kidney disease (CKD), stage V (HCC) 11/21/2019   History of stroke 10/27/2019   Headache 10/27/2016   Hyperlipidemia 10/27/2016   History of non-ST elevation myocardial infarction (NSTEMI) 10/25/2016   History of coronary angioplasty 10/25/2016   BPV (benign positional vertigo) 06/15/2016   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HCC) 11/21/2015   Acid reflux 06/05/2015   Benign secondary hypertension due to renal artery stenosis (HCC) 06/05/2015   Hypothyroidism, 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   Major depression in remission (HCC) 06/05/2015   Obesity (BMI 30.0-34.9) 06/05/2015   Neuropathy 06/05/2015   Perennial allergic rhinitis with seasonal variation 06/05/2015   Arthritis, degenerative 07/03/2014   AAA (abdominal aortic aneurysm) without rupture (HCC) 06/26/2014    Past Surgical History:  Procedure Laterality Date   ABDOMINAL AORTIC ANEURYSM REPAIR     ABDOMINAL HYSTERECTOMY     APPENDECTOMY     CORONARY STENT INTERVENTION N/A 10/26/2016   Procedure: Coronary Stent Intervention;  Surgeon: Alwyn Pea, MD;  Location: [facility];  Service: Cardiovascular;  Laterality: N/A;   HERNIA REPAIR     INNER EAR SURGERY     pt not sure of type   LEFT HEART CATH AND CORONARY ANGIOGRAPHY N/A 10/26/2016   Procedure: Left Heart Cath and Coronary Angiography;  Surgeon: Dalia Heading, MD;  Location: [facility];  Service: Cardiovascular;  Laterality: N/A;   RENAL ANGIOGRAPHY N/A 06/11/2021   Procedure: RENAL ANGIOGRAPHY;  Surgeon: Annice Needy, MD;  Location: [facility];  Service: Cardiovascular;  Laterality: N/A;   THYROIDECTOMY      Family History  Problem Relation Age of Onset   Healthy Mother    Cerebral aneurysm Father     Social History   Tobacco Use   Smoking  status: Former    Current packs/day: 0.00    Average packs/day: 0.5 packs/day for 20.0 years (10.0 ttl pk-yrs)    Types: Cigarettes    Start date: 33    Quit date: 2000    Years since quitting: 25.2   Smokeless tobacco: Never   Tobacco comments:    smoking cessation materials not required  Substance Use Topics   Alcohol use: No    Alcohol/week: 0.0 standard drinks of alcohol     Current Outpatient Medications:    acetaminophen (TYLENOL) 650 MG CR tablet, Take 1 tablet (650 mg total) by mouth every 8 (eight) hours as needed for pain., Disp: 20 tablet, Rfl: 1   aspirin EC 81 MG tablet, Take 81 mg by mouth daily. Swallow whole., Disp: , Rfl:    calcitRIOL (ROCALTROL) 0.25 MCG capsule, Take 0.25 mcg by mouth daily., Disp: , Rfl:    Cholecalciferol 125 MCG (5000 UT) TABS, Take 5,000 Units by mouth daily., Disp: , Rfl:    cloNIDine (CATAPRES)  0.2 MG tablet, Take 0.2 mg by mouth 2 (two) times daily., Disp: , Rfl:    latanoprost (XALATAN) 0.005 % ophthalmic solution, Place 1 drop into both eyes at bedtime., Disp: , Rfl:    levothyroxine (SYNTHROID) 75 MCG tablet, Take 1 tablet (75 mcg total) by mouth daily before breakfast., Disp: 90 tablet, Rfl: 1   senna-docusate (SENOKOT-S) 8.6-50 MG tablet, Take 1 tablet by mouth at bedtime as needed for mild constipation., Disp: 60 tablet, Rfl: 0   atorvastatin (LIPITOR) 40 MG tablet, TAKE 1 TABLET BY MOUTH ONCE DAILY AT  6PM.  CHOLESTEROL  MEDICATION., Disp: 90 tablet, Rfl: 1   clopidogrel (PLAVIX) 75 MG tablet, TAKE 1 TABLET BY MOUTH ONCE DAILY .  BLOOD  THINNER  TO  HELP  REDUCE  STROKE  RISK, Disp: 90 tablet, Rfl: 1   ezetimibe (ZETIA) 10 MG tablet, Take 1 tablet (10 mg total) by mouth daily., Disp: 90 tablet, Rfl: 1   labetalol (NORMODYNE) 300 MG tablet, Take 1 tablet (300 mg total) by mouth 2 (two) times daily., Disp: 180 tablet, Rfl: 1  Allergies  Allergen Reactions   Ace Inhibitors Cough   Ciprofloxacin Other (See Comments)    unknown    Citalopram     hyponatremia   Hydrocodone-Acetaminophen Other (See Comments)    unknown   Norvasc [Amlodipine Besylate]     Pruritus    Pantoprazole Sodium Other (See Comments)    Cramps   Pravastatin Other (See Comments)    Stomach cramps   Sulfa Antibiotics     unknown   Clonidine Other (See Comments)    Per patient told by allergist that would be best to stop but nephrologist recommended her to stay on medication     I personally reviewed active problem list, medication list, allergies with the patient/caregiver today.   ROS  Ten systems reviewed and is negative except as mentioned in HPI    Objective Physical Exam Constitutional: Patient appears well-developed and well-nourished.  No distress.  HEENT: head atraumatic, normocephalic, pupils equal and reactive to light, neck supple Cardiovascular: Normal rate, regular rhythm and normal heart sounds.  No murmur heard. No BLE edema. Pulmonary/Chest: Effort normal and breath sounds normal. No respiratory distress. Abdominal: Soft.  There is no tenderness. Muscular skeletal: walks slowly, no pain during palpation of lumbar spine  Psychiatric: Patient has a normal mood and affect. behavior is normal. Judgment and thought content normal.   Vitals:   12/30/23 1333  BP: (!) 164/98  Pulse: 62  Resp: 16  SpO2: 99%  Weight: 157 lb 4.8 oz (71.4 kg)  Height: 5\' 3"  (1.6 m)    Body mass index is 27.86 kg/m.  Recent Results (from the past 2160 hours)  Basic metabolic panel     Status: Abnormal   Collection Time: 11/14/23  6:30 PM  Result Value Ref Range   Sodium 132 (L) 135 - 145 mmol/L   Potassium 4.7 3.5 - 5.1 mmol/L   Chloride 100 98 - 111 mmol/L   CO2 18 (L) 22 - 32 mmol/L   Glucose, Bld 123 (H) 70 - 99 mg/dL    Comment: Glucose reference range applies only to samples taken after fasting for at least 8 hours.   BUN 63 (H) 8 - 23 mg/dL   Creatinine, Ser 4.09 (H) 0.44 - 1.00 mg/dL   Calcium 9.4 8.9 - 81.1 mg/dL   GFR,  Estimated 10 (L) >60 mL/min    Comment: (NOTE) Calculated using the CKD-EPI  Creatinine Equation (2021)    Anion gap 14 5 - 15    Comment: Performed at [facility], [address] [place], [place] [address]  CBC     Status: Abnormal   Collection Time: 11/14/23  6:30 PM  Result Value Ref Range   WBC 11.2 (H) 4.0 - 10.5 K/uL   RBC 2.84 (L) 3.87 - 5.11 MIL/uL   Hemoglobin 8.8 (L) 12.0 - 15.0 g/dL   HCT 43.3 (L) 29.5 - 18.8 %   MCV 95.1 80.0 - 100.0 fL   MCH 31.0 26.0 - 34.0 pg   MCHC 32.6 30.0 - 36.0 g/dL   RDW 41.6 60.6 - 30.1 %   Platelets 181 150 - 400 K/uL   nRBC 0.0 0.0 - 0.2 %    Comment: Performed at [facility], [address], [place], [place] [address]  Troponin I (High Sensitivity)     Status: None   Collection Time: 11/14/23  6:30 PM  Result Value Ref Range   Troponin I (High Sensitivity) 16 <18 ng/L    Comment: (NOTE) Elevated high sensitivity troponin I (hsTnI) values and significant  changes across serial measurements may suggest ACS but many other  chronic and acute conditions are known to elevate hsTnI results.  Refer to the "Links" section for chest pain algorithms and additional  guidance. Performed at [facility], [address] [place], [place] [address]   Hepatic function panel     Status: Abnormal   Collection Time: 11/14/23  6:30 PM  Result Value Ref Range   Total Protein 6.0 (L) 6.5 - 8.1 g/dL   Albumin 3.2 (L) 3.5 - 5.0 g/dL   AST 25 15 - 41 U/L   ALT 12 0 - 44 U/L   Alkaline Phosphatase 57 38 - 126 U/L   Total Bilirubin 0.8 0.0 - 1.2 mg/dL   Bilirubin, Direct 0.1 0.0 - 0.2 mg/dL   Indirect Bilirubin 0.7 0.3 - 0.9 mg/dL    Comment: Performed at [facility], [address] [place], [place] [address]  Resp panel by RT-PCR (RSV, Flu A&B, Covid) Anterior Nasal Swab     Status: None   Collection Time: 11/14/23 11:30 PM   Specimen: Anterior Nasal Swab  Result Value Ref Range   SARS Coronavirus 2 by RT PCR  NEGATIVE NEGATIVE    Comment: (NOTE) SARS-CoV-2 target nucleic acids are NOT DETECTED.  The SARS-CoV-2 RNA is generally detectable in upper respiratory specimens during the acute phase of infection. The lowest concentration of SARS-CoV-2 viral copies this assay can detect is 138 copies/mL. A negative result does not preclude SARS-Cov-2 infection and should not be used as the sole basis for treatment or other patient management decisions. A negative result may occur with  improper specimen collection/handling, submission of specimen other than nasopharyngeal swab, presence of viral mutation(s) within the areas targeted by this assay, and inadequate number of viral copies(<138 copies/mL). A negative result must be combined with clinical observations, patient history, and epidemiological information. The expected result is Negative.  Fact Sheet for Patients:  BloggerCourse.com  Fact Sheet for Healthcare Providers:  SeriousBroker.it  This test is no t yet approved or cleared by the [place] FDA and  has been authorized for detection and/or diagnosis of SARS-CoV-2 by FDA under an Emergency Use Authorization (EUA). This EUA will remain  in effect (meaning this test can be used) for the duration of the COVID-19 declaration under Section 564(b)(1) of the Act, 21 U.S.C.section  360bbb-3(b)(1), unless the authorization is terminated  or revoked sooner.       Influenza A by PCR NEGATIVE NEGATIVE   Influenza B by PCR NEGATIVE NEGATIVE    Comment: (NOTE) The Xpert Xpress SARS-CoV-2/FLU/RSV plus assay is intended as an aid in the diagnosis of influenza from Nasopharyngeal swab specimens and should not be used as a sole basis for treatment. Nasal washings and aspirates are unacceptable for Xpert Xpress SARS-CoV-2/FLU/RSV testing.  Fact Sheet for Patients: BloggerCourse.com  Fact Sheet for Healthcare  Providers: SeriousBroker.it  This test is not yet approved or cleared by the [place] FDA and has been authorized for detection and/or diagnosis of SARS-CoV-2 by FDA under an Emergency Use Authorization (EUA). This EUA will remain in effect (meaning this test can be used) for the duration of the COVID-19 declaration under Section 564(b)(1) of the Act, 21 U.S.C. section 360bbb-3(b)(1), unless the authorization is terminated or revoked.     Resp Syncytial Virus by PCR NEGATIVE NEGATIVE    Comment: (NOTE) Fact Sheet for Patients: BloggerCourse.com  Fact Sheet for Healthcare Providers: SeriousBroker.it  This test is not yet approved or cleared by the [place] FDA and has been authorized for detection and/or diagnosis of SARS-CoV-2 by FDA under an Emergency Use Authorization (EUA). This EUA will remain in effect (meaning this test can be used) for the duration of the COVID-19 declaration under Section 564(b)(1) of the Act, 21 U.S.C. section 360bbb-3(b)(1), unless the authorization is terminated or revoked.  Performed at [facility], [address] [place], [place] [address]   Urinalysis, w/ Reflex to Culture (Infection Suspected) -Urine, Clean Catch     Status: Abnormal   Collection Time: 11/15/23  3:36 AM  Result Value Ref Range   Specimen Source URINE, CLEAN CATCH    Color, Urine YELLOW (A) YELLOW   APPearance CLEAR (A) CLEAR   Specific Gravity, Urine 1.016 1.005 - 1.030   pH 5.0 5.0 - 8.0   Glucose, UA NEGATIVE NEGATIVE mg/dL   Hgb urine dipstick MODERATE (A) NEGATIVE   Bilirubin Urine NEGATIVE NEGATIVE   Ketones, ur NEGATIVE NEGATIVE mg/dL   Protein, ur >=604 (A) NEGATIVE mg/dL   Nitrite NEGATIVE NEGATIVE   Leukocytes,Ua NEGATIVE NEGATIVE   RBC / HPF 21-50 0 - 5 RBC/hpf   WBC, UA 0-5 0 - 5 WBC/hpf    Comment:        Reflex urine culture not performed if WBC <=10, OR if  Squamous epithelial cells >5. If Squamous epithelial cells >5 suggest recollection.    Bacteria, UA NONE SEEN NONE SEEN   Squamous Epithelial / HPF 0 0 - 5 /HPF   Mucus PRESENT     Comment: Performed at [facility], [address] [place], [place] [address]      PHQ2/9:    12/30/2023    1:32 PM 09/12/2023   10:27 AM 09/01/2023   10:20 AM 07/08/2023    1:22 PM 01/05/2023    1:15 PM  Depression screen PHQ 2/9  Decreased Interest 0 0 0 1 0  Down, Depressed, Hopeless 0 0 0 1 0  PHQ - 2 Score 0 0 0 2 0  Altered sleeping 0  0 0 0  Tired, decreased energy 0  0 1 0  Change in appetite 0  0 0 0  Feeling bad or failure about yourself  0  0 0 0  Trouble concentrating 0  0 0 0  Moving slowly or fidgety/restless 0  0 0  0  Suicidal thoughts 0  0 0 0  PHQ-9 Score 0  0 3 0  Difficult doing work/chores Not difficult at all  Not difficult at all Not difficult at all     phq 9 is negative  Fall Risk:    12/30/2023    1:28 PM 09/12/2023   10:27 AM 09/01/2023   10:24 AM 08/17/2023   10:05 AM 07/08/2023    1:22 PM  Fall Risk   Falls in the past year? 1 0 1 1 1   Number falls in past yr: 1  1 0 1  Injury with Fall? 0  1 1 1   Comment    hurt coccyx   Risk for fall due to : Impaired balance/gait  History of fall(s)  History of fall(s);Impaired balance/gait  Follow up Falls prevention discussed;Education provided;Falls evaluation completed  Falls evaluation completed;Falls prevention discussed  Falls prevention discussed;Education provided;Falls evaluation completed     Assessment & Plan  1. CKD (chronic kidney disease), stage V (HCC) (Primary)  Under the care of nephrologist   2. Angina pectoris (HCC)  Continue statin therapy and plavix  3. 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HCC)  - atorvastatin (LIPITOR) 40 MG tablet; TAKE 1 TABLET BY MOUTH ONCE DAILY AT  6PM.  CHOLESTEROL  MEDICATION.  Dispense: 90 tablet; Refill: 1  4. Secondary hyperparathyroidism of renal  origin [facility])  Monitored by nephrologist, reviewed today   5. History of lacunar cerebrovascular accident (CVA)  - atorvastatin (LIPITOR) 40 MG tablet; TAKE 1 TABLET BY MOUTH ONCE DAILY AT  6PM.  CHOLESTEROL  MEDICATION.  Dispense: 90 tablet; Refill: 1 - clopidogrel (PLAVIX) 75 MG tablet; TAKE 1 TABLET BY MOUTH ONCE DAILY .  BLOOD  THINNER  TO  HELP  REDUCE  STROKE  RISK  Dispense: 90 tablet; Refill: 1  6. Spinal stenosis of lumbar region without neurogenic claudication  Seen by Dr. Idalia Needle, refused spinal steroid injections, doing better now   7. Hypothyroidism, postsurgical  - TSH  8. History of non-ST elevation myocardial infarction (NSTEMI)  - clopidogrel (PLAVIX) 75 MG tablet; TAKE 1 TABLET BY MOUTH ONCE DAILY .  BLOOD  THINNER  TO  HELP  REDUCE  STROKE  RISK  Dispense: 90 tablet; Refill: 1  9. History of iron deficiency  - Fe+TIBC+Fer  10. Uncontrolled hypertension  - labetalol (NORMODYNE) 300 MG tablet; Take 1 tablet (300 mg total) by mouth 2 (two) times daily.  Dispense: 180 tablet; Refill: 1  However normal bp when seen by nephrologist recently   11. History of stent insertion of renal artery  - ezetimibe (ZETIA) 10 MG tablet; Take 1 tablet (10 mg total) by mouth daily.  Dispense: 90 tablet; Refill: 1

## 2023-12-31 LAB — IRON,TIBC AND FERRITIN PANEL
%SAT: 27 % (ref 16–45)
Ferritin: 229 ng/mL (ref 16–288)
Iron: 59 ug/dL (ref 45–160)
TIBC: 220 ug/dL — ABNORMAL LOW (ref 250–450)

## 2023-12-31 LAB — TSH: TSH: 1.6 m[IU]/L (ref 0.40–4.50)

## 2024-01-13 ENCOUNTER — Other Ambulatory Visit: Payer: Self-pay | Admitting: Family Medicine

## 2024-01-13 DIAGNOSIS — Z9889 Other specified postprocedural states: Secondary | ICD-10-CM

## 2024-01-13 DIAGNOSIS — I700 Atherosclerosis of aorta: Secondary | ICD-10-CM

## 2024-01-13 DIAGNOSIS — I252 Old myocardial infarction: Secondary | ICD-10-CM

## 2024-01-13 DIAGNOSIS — I10 Essential (primary) hypertension: Secondary | ICD-10-CM

## 2024-01-13 DIAGNOSIS — Z8673 Personal history of transient ischemic attack (TIA), and cerebral infarction without residual deficits: Secondary | ICD-10-CM

## 2024-01-13 NOTE — Telephone Encounter (Signed)
 Requested medication (s) are due for refill today:   Yes for 3  Not sure about Clonidine  0.2 mg  Requested medication (s) are on the active medication list:   Yes for 3; Clonidine  is historical  Future visit scheduled:   Yes 10/13    LOV 12/30/2023   Last ordered: All ordered 12/30/2023 with 90 day supply with 1 refill.  Unable to refill because the clonidine  is historical.   (Someone, not sure who, refused all these earlier today indicating the pt was no longer under Dr. Ava Lei' care, but pt is still under her care).        Requested Prescriptions  Pending Prescriptions Disp Refills   cloNIDine  (CATAPRES ) 0.2 MG tablet      Sig: Take 1 tablet (0.2 mg total) by mouth 2 (two) times daily.     Cardiovascular:  Alpha-2 Agonists Failed - 01/13/2024  3:41 PM      Failed - Last BP in normal range    BP Readings from Last 1 Encounters:  12/30/23 (!) 156/88         Passed - Last Heart Rate in normal range    Pulse Readings from Last 1 Encounters:  12/30/23 62         Passed - Valid encounter within last 6 months    Recent Outpatient Visits           2 weeks ago CKD (chronic kidney disease), stage V [facility])   [place] [facility] Arleen Lacer, MD       Future Appointments             In 5 months Sowles, Krichna, MD [facility]             clopidogrel  (PLAVIX ) 75 MG tablet 90 tablet 1    Sig: TAKE 1 TABLET BY MOUTH ONCE DAILY .  BLOOD  THINNER  TO  HELP  REDUCE  STROKE  RISK     Hematology: Antiplatelets - clopidogrel  Failed - 01/13/2024  3:41 PM      Failed - HCT in normal range and within 180 days    HCT  Date Value Ref Range Status  11/14/2023 27.0 (L) 36.0 - 46.0 % Final  04/04/2012 33.8 (L) 35.0 - 47.0 % Final         Failed - HGB in normal range and within 180 days    Hemoglobin  Date Value Ref Range Status  11/14/2023 8.8 (L) 12.0 - 15.0 g/dL Final   HGB  Date Value Ref Range Status  04/04/2012 11.3 (L) 12.0  - 16.0 g/dL Final         Failed - Cr in normal range and within 360 days    Creat  Date Value Ref Range Status  07/08/2023 4.14 (H) 0.60 - 1.00 mg/dL Final   Creatinine, Ser  Date Value Ref Range Status  11/14/2023 4.21 (H) 0.44 - 1.00 mg/dL Final   Creatinine, Urine  Date Value Ref Range Status  06/07/2021 29 mg/dL Final    Comment:    Performed at [facility], [address] [place], [place] [address]         Passed - PLT in normal range and within 180 days    Platelets  Date Value Ref Range Status  11/14/2023 181 150 - 400 K/uL Final   Platelet  Date Value Ref Range Status  04/04/2012 323 150 - 440 x10 3/mm 3 Final  Passed - Valid encounter within last 6 months    Recent Outpatient Visits           2 weeks ago CKD (chronic kidney disease), stage V [facility])   [place] [facility] Arleen Lacer, MD       Future Appointments             In 5 months Sowles, Krichna, MD [facility]             atorvastatin  (LIPITOR) 40 MG tablet 90 tablet 1    Sig: TAKE 1 TABLET BY MOUTH ONCE DAILY AT  6PM.  CHOLESTEROL  MEDICATION.     Cardiovascular:  Antilipid - Statins Failed - 01/13/2024  3:41 PM      Failed - Lipid Panel in normal range within the last 12 months    Cholesterol  Date Value Ref Range Status  07/08/2023 176 <200 mg/dL Final   LDL Cholesterol (Calc)  Date Value Ref Range Status  07/08/2023 101 (H) mg/dL (calc) Final    Comment:    Reference range: <100 . Desirable range <100 mg/dL for primary prevention;   <70 mg/dL for patients with CHD or diabetic patients  with > or = 2 CHD risk factors. Aaron Aas LDL-C is now calculated using the Martin-Hopkins  calculation, which is a validated novel method providing  better accuracy than the Friedewald equation in the  estimation of LDL-C.  Melinda Sprawls et al. Erroll Heard. 1610;960(45): 2061-2068  (http://education.[employer].com/faq/FAQ164)     HDL  Date Value Ref Range Status  07/08/2023 57 > OR = 50 mg/dL Final   Triglycerides  Date Value Ref Range Status  07/08/2023 89 <150 mg/dL Final         Passed - Patient is not pregnant      Passed - Valid encounter within last 12 months    Recent Outpatient Visits           2 weeks ago CKD (chronic kidney disease), stage V [facility])   [place] [facility] Arleen Lacer, MD       Future Appointments             In 5 months Sowles, Krichna, MD [facility]             ezetimibe  (ZETIA ) 10 MG tablet 90 tablet 1    Sig: Take 1 tablet (10 mg total) by mouth daily.     Cardiovascular:  Antilipid - Sterol Transport Inhibitors Failed - 01/13/2024  3:41 PM      Failed - Lipid Panel in normal range within the last 12 months    Cholesterol  Date Value Ref Range Status  07/08/2023 176 <200 mg/dL Final   LDL Cholesterol (Calc)  Date Value Ref Range Status  07/08/2023 101 (H) mg/dL (calc) Final    Comment:    Reference range: <100 . Desirable range <100 mg/dL for primary prevention;   <70 mg/dL for patients with CHD or diabetic patients  with > or = 2 CHD risk factors. Aaron Aas LDL-C is now calculated using the Martin-Hopkins  calculation, which is a validated novel method providing  better accuracy than the Friedewald equation in the  estimation of LDL-C.  Melinda Sprawls et al. Erroll Heard. 4098;119(14): 2061-2068  (http://education.[facility].com/faq/FAQ164)    HDL  Date Value Ref Range Status  07/08/2023 57 > OR = 50 mg/dL Final   Triglycerides  Date Value Ref Range Status  07/08/2023 89 <150 mg/dL  Final         Passed - AST in normal range and within 360 days    AST  Date Value Ref Range Status  11/14/2023 25 15 - 41 U/L Final   SGOT(AST)  Date Value Ref Range Status  04/04/2012 22 15 - 37 Unit/L Final         Passed - ALT in normal range and within 360 days    ALT  Date Value Ref Range Status  11/14/2023  12 0 - 44 U/L Final   SGPT (ALT)  Date Value Ref Range Status  04/04/2012 15 U/L Final    Comment:    12-78 NOTE: NEW REFERENCE RANGE 08/13/2011          Passed - Patient is not pregnant      Passed - Valid encounter within last 12 months    Recent Outpatient Visits           2 weeks ago CKD (chronic kidney disease), stage V [facility])   [place] [facility] Arleen Lacer, MD       Future Appointments             In 5 months Ava Lei, Krichna, MD [facility], [facility]

## 2024-01-13 NOTE — Telephone Encounter (Signed)
 Copied from CRM [id number]. Topic: Clinical - Medication Refill >> Jan 13, 2024 10:01 AM Felizardo Hotter wrote: Most Recent Primary Care Visit:  Provider: Arleen Lacer  Department: [facility]  Visit Type: OFFICE VISIT  Date: 12/30/2023  Medication: cloNIDine  (CATAPRES ) 0.2 MG tablet, clopidogrel  (PLAVIX ) 75 MG tablet, atorvastatin  (LIPITOR) 40 MG tablet, ezetimibe  (ZETIA ) 10 MG tablet  Has the patient contacted their pharmacy? Yes (Agent: If no, request that the patient contact the pharmacy for the refill. If patient does not wish to contact the pharmacy document the reason why and proceed with request.) (Agent: If yes, when and what did the pharmacy advise?)  Is this the correct pharmacy for this prescription? Yes If no, delete pharmacy and type the correct one.  This is the patient's preferred pharmacy:  [facility] [address]), [place] - [address] [place]ough) [place] [address] Phone: [phone] Fax: [phone]   Has the prescription been filled recently? Yes  Is the patient out of the medication? Yes  Has the patient been seen for an appointment in the last year OR does the patient have an upcoming appointment? Yes  Can we respond through MyChart? Yes  Agent: Please be advised that Rx refills may take up to 3 business days. We ask that you follow-up with your pharmacy.

## 2024-01-16 ENCOUNTER — Other Ambulatory Visit: Payer: Self-pay | Admitting: Family Medicine

## 2024-01-16 DIAGNOSIS — E890 Postprocedural hypothyroidism: Secondary | ICD-10-CM

## 2024-01-17 ENCOUNTER — Other Ambulatory Visit: Payer: Self-pay

## 2024-01-17 DIAGNOSIS — E890 Postprocedural hypothyroidism: Secondary | ICD-10-CM

## 2024-01-17 MED ORDER — CLONIDINE HCL 0.2 MG PO TABS: 0.2000 mg | ORAL_TABLET | Freq: Two times a day (BID) | ORAL | 1 refills | Status: DC

## 2024-01-17 MED ORDER — LEVOTHYROXINE SODIUM 75 MCG PO TABS: 75.0000 ug | ORAL_TABLET | Freq: Every day | ORAL | 1 refills | Status: DC

## 2024-01-31 DIAGNOSIS — E871 Hypo-osmolality and hyponatremia: Secondary | ICD-10-CM | POA: Diagnosis not present

## 2024-01-31 DIAGNOSIS — N2581 Secondary hyperparathyroidism of renal origin: Secondary | ICD-10-CM | POA: Diagnosis not present

## 2024-01-31 DIAGNOSIS — I120 Hypertensive chronic kidney disease with stage 5 chronic kidney disease or end stage renal disease: Secondary | ICD-10-CM | POA: Diagnosis not present

## 2024-01-31 DIAGNOSIS — D631 Anemia in chronic kidney disease: Secondary | ICD-10-CM | POA: Diagnosis not present

## 2024-01-31 DIAGNOSIS — N185 Chronic kidney disease, stage 5: Secondary | ICD-10-CM | POA: Diagnosis not present

## 2024-01-31 DIAGNOSIS — I701 Atherosclerosis of renal artery: Secondary | ICD-10-CM | POA: Diagnosis not present

## 2024-01-31 DIAGNOSIS — R809 Proteinuria, unspecified: Secondary | ICD-10-CM | POA: Diagnosis not present

## 2024-04-16 ENCOUNTER — Other Ambulatory Visit: Payer: Self-pay | Admitting: Family Medicine

## 2024-04-17 ENCOUNTER — Other Ambulatory Visit: Payer: Self-pay | Admitting: Family Medicine

## 2024-04-17 DIAGNOSIS — Z8673 Personal history of transient ischemic attack (TIA), and cerebral infarction without residual deficits: Secondary | ICD-10-CM

## 2024-04-17 DIAGNOSIS — I700 Atherosclerosis of aorta: Secondary | ICD-10-CM

## 2024-04-17 DIAGNOSIS — I252 Old myocardial infarction: Secondary | ICD-10-CM

## 2024-04-17 NOTE — Telephone Encounter (Unsigned)
 Copied from CRM [id number]. Topic: Clinical - Medication Refill >> Apr 17, 2024  3:53 PM Carlatta H wrote: Medication: cloNIDine  (CATAPRES ) 0.2 MG tablet clopidogrel  (PLAVIX ) 75 MG tablet atorvastatin  (LIPITOR) 40 MG tablet  Has the patient contacted their pharmacy? No (Agent: If no, request that the patient contact the pharmacy for the refill. If patient does not wish to contact the pharmacy document the reason why and proceed with request.) (Agent: If yes, when and what did the pharmacy advise?)  This is the patient's preferred pharmacy:  [facility] [address]), [place] - [address] [place]) [place] [address] Phone: [phone] Fax: [phone]  Is this the correct pharmacy for this prescription? Yes If no, delete pharmacy and type the correct one.   Has the prescription been filled recently? No  Is the patient out of the medication? Yes  Has the patient been seen for an appointment in the last year OR does the patient have an upcoming appointment? Yes  Can we respond through MyChart? No  Agent: Please be advised that Rx refills may take up to 3 business days. We ask that you follow-up with your pharmacy.

## 2024-04-18 MED ORDER — ATORVASTATIN CALCIUM 40 MG PO TABS: ORAL_TABLET | ORAL | 1 refills | Status: AC

## 2024-04-18 MED ORDER — CLOPIDOGREL BISULFATE 75 MG PO TABS: ORAL_TABLET | ORAL | 1 refills | Status: AC

## 2024-04-18 MED ORDER — CLONIDINE HCL 0.2 MG PO TABS: 0.2000 mg | ORAL_TABLET | Freq: Two times a day (BID) | ORAL | 1 refills | Status: DC

## 2024-04-18 NOTE — Telephone Encounter (Signed)
 Requested Prescriptions  Pending Prescriptions Disp Refills   cloNIDine  (CATAPRES ) 0.2 MG tablet 90 tablet 1    Sig: Take 1 tablet (0.2 mg total) by mouth 2 (two) times daily.     Cardiovascular:  Alpha-2 Agonists Failed - 04/18/2024  3:00 PM      Failed - Last BP in normal range    BP Readings from Last 1 Encounters:  12/30/23 (!) 156/88         Passed - Last Heart Rate in normal range    Pulse Readings from Last 1 Encounters:  12/30/23 62         Passed - Valid encounter within last 6 months    Recent Outpatient Visits           3 months ago CKD (chronic kidney disease), stage V [facility])   [place] [facility] [name], MD       Future Appointments             In 2 months Sowles, Krichna, MD [facility], [facility]             clopidogrel  (PLAVIX ) 75 MG tablet 90 tablet 1    Sig: TAKE 1 TABLET BY MOUTH ONCE DAILY .  BLOOD  THINNER  TO  HELP  REDUCE  STROKE  RISK     Hematology: Antiplatelets - clopidogrel  Failed - 04/18/2024  3:00 PM      Failed - HCT in normal range and within 180 days    HCT  Date Value Ref Range Status  11/14/2023 27.0 (L) 36.0 - 46.0 % Final  04/04/2012 33.8 (L) 35.0 - 47.0 % Final         Failed - HGB in normal range and within 180 days    Hemoglobin  Date Value Ref Range Status  11/14/2023 8.8 (L) 12.0 - 15.0 g/dL Final   HGB  Date Value Ref Range Status  04/04/2012 11.3 (L) 12.0 - 16.0 g/dL Final         Failed - Cr in normal range and within 360 days    Creat  Date Value Ref Range Status  07/08/2023 4.14 (H) 0.60 - 1.00 mg/dL Final   Creatinine, Ser  Date Value Ref Range Status  11/14/2023 4.21 (H) 0.44 - 1.00 mg/dL Final   Creatinine, Urine  Date Value Ref Range Status  06/07/2021 29 mg/dL Final    Comment:    Performed at [facility], [address] [place], [place] [address]         Passed - PLT in normal range and within 180 days    Platelets  Date Value Ref  Range Status  11/14/2023 181 150 - 400 K/uL Final   Platelet  Date Value Ref Range Status  04/04/2012 323 150 - 440 x10 3/mm 3 Final         Passed - Valid encounter within last 6 months    Recent Outpatient Visits           3 months ago CKD (chronic kidney disease), stage V [facility])   [place] [facility] [name], MD       Future Appointments             In 2 months Sowles, Krichna, MD [facility]             atorvastatin  (LIPITOR) 40 MG tablet 90 tablet 1    Sig: TAKE 1 TABLET BY  MOUTH ONCE DAILY AT  6PM.  CHOLESTEROL  MEDICATION.     Cardiovascular:  Antilipid - Statins Failed - 04/18/2024  3:00 PM      Failed - Lipid Panel in normal range within the last 12 months    Cholesterol  Date Value Ref Range Status  07/08/2023 176 <200 mg/dL Final   LDL Cholesterol (Calc)  Date Value Ref Range Status  07/08/2023 101 (H) mg/dL (calc) Final    Comment:    Reference range: <100 . Desirable range <100 mg/dL for primary prevention;   <70 mg/dL for patients with CHD or diabetic patients  with > or = 2 CHD risk factors. SABRA LDL-C is now calculated using the Martin-Hopkins  calculation, which is a validated novel method providing  better accuracy than the Friedewald equation in the  estimation of LDL-C.  Gladis APPLETHWAITE et al. SANDREA. 7986;689(80): 2061-2068  (http://education.[facility].com/faq/FAQ164)    HDL  Date Value Ref Range Status  07/08/2023 57 > OR = 50 mg/dL Final   Triglycerides  Date Value Ref Range Status  07/08/2023 89 <150 mg/dL Final         Passed - Patient is not pregnant      Passed - Valid encounter within last 12 months    Recent Outpatient Visits           3 months ago CKD (chronic kidney disease), stage V [facility])   [facility] [name], MD       Future Appointments             In 2 months Glenard, Krichna, MD [facility], [facility]

## 2024-06-13 ENCOUNTER — Other Ambulatory Visit: Payer: Self-pay

## 2024-06-13 ENCOUNTER — Emergency Department

## 2024-06-13 ENCOUNTER — Inpatient Hospital Stay
Admission: EM | Admit: 2024-06-13 | Discharge: 2024-06-22 | DRG: 371 | Disposition: A | Discharge status: Home / Self Care | Attending: Internal Medicine | Admitting: Internal Medicine

## 2024-06-13 DIAGNOSIS — E876 Hypokalemia: Secondary | ICD-10-CM | POA: Diagnosis present

## 2024-06-13 DIAGNOSIS — Z7902 Long term (current) use of antithrombotics/antiplatelets: Secondary | ICD-10-CM

## 2024-06-13 DIAGNOSIS — I2510 Atherosclerotic heart disease of native coronary artery without angina pectoris: Secondary | ICD-10-CM | POA: Diagnosis not present

## 2024-06-13 DIAGNOSIS — N2581 Secondary hyperparathyroidism of renal origin: Secondary | ICD-10-CM | POA: Diagnosis present

## 2024-06-13 DIAGNOSIS — Z87891 Personal history of nicotine dependence: Secondary | ICD-10-CM

## 2024-06-13 DIAGNOSIS — K922 Gastrointestinal hemorrhage, unspecified: Secondary | ICD-10-CM

## 2024-06-13 DIAGNOSIS — E860 Dehydration: Principal | ICD-10-CM | POA: Diagnosis present

## 2024-06-13 DIAGNOSIS — D62 Acute posthemorrhagic anemia: Secondary | ICD-10-CM | POA: Diagnosis not present

## 2024-06-13 DIAGNOSIS — Z885 Allergy status to narcotic agent status: Secondary | ICD-10-CM

## 2024-06-13 DIAGNOSIS — N185 Chronic kidney disease, stage 5: Secondary | ICD-10-CM | POA: Diagnosis present

## 2024-06-13 DIAGNOSIS — N179 Acute kidney failure, unspecified: Secondary | ICD-10-CM | POA: Diagnosis not present

## 2024-06-13 DIAGNOSIS — E1122 Type 2 diabetes mellitus with diabetic chronic kidney disease: Secondary | ICD-10-CM | POA: Diagnosis present

## 2024-06-13 DIAGNOSIS — Z888 Allergy status to other drugs, medicaments and biological substances status: Secondary | ICD-10-CM

## 2024-06-13 DIAGNOSIS — D631 Anemia in chronic kidney disease: Secondary | ICD-10-CM | POA: Diagnosis not present

## 2024-06-13 DIAGNOSIS — Z8673 Personal history of transient ischemic attack (TIA), and cerebral infarction without residual deficits: Secondary | ICD-10-CM

## 2024-06-13 DIAGNOSIS — N3001 Acute cystitis with hematuria: Secondary | ICD-10-CM | POA: Diagnosis present

## 2024-06-13 DIAGNOSIS — A040 Enteropathogenic Escherichia coli infection: Secondary | ICD-10-CM | POA: Diagnosis not present

## 2024-06-13 DIAGNOSIS — E8721 Acute metabolic acidosis: Secondary | ICD-10-CM | POA: Diagnosis present

## 2024-06-13 DIAGNOSIS — Z882 Allergy status to sulfonamides status: Secondary | ICD-10-CM

## 2024-06-13 DIAGNOSIS — Z743 Need for continuous supervision: Secondary | ICD-10-CM | POA: Diagnosis not present

## 2024-06-13 DIAGNOSIS — E785 Hyperlipidemia, unspecified: Secondary | ICD-10-CM | POA: Diagnosis present

## 2024-06-13 DIAGNOSIS — R400 Somnolence: Secondary | ICD-10-CM | POA: Diagnosis not present

## 2024-06-13 DIAGNOSIS — E890 Postprocedural hypothyroidism: Secondary | ICD-10-CM | POA: Diagnosis not present

## 2024-06-13 DIAGNOSIS — I120 Hypertensive chronic kidney disease with stage 5 chronic kidney disease or end stage renal disease: Secondary | ICD-10-CM | POA: Diagnosis present

## 2024-06-13 DIAGNOSIS — E663 Overweight: Secondary | ICD-10-CM | POA: Diagnosis present

## 2024-06-13 DIAGNOSIS — Z6827 Body mass index (BMI) 27.0-27.9, adult: Secondary | ICD-10-CM

## 2024-06-13 DIAGNOSIS — Z79899 Other long term (current) drug therapy: Secondary | ICD-10-CM | POA: Diagnosis not present

## 2024-06-13 DIAGNOSIS — Z8679 Personal history of other diseases of the circulatory system: Secondary | ICD-10-CM

## 2024-06-13 DIAGNOSIS — I10 Essential (primary) hypertension: Secondary | ICD-10-CM | POA: Diagnosis present

## 2024-06-13 DIAGNOSIS — Z79890 Hormone replacement therapy (postmenopausal): Secondary | ICD-10-CM

## 2024-06-13 DIAGNOSIS — Z90710 Acquired absence of both cervix and uterus: Secondary | ICD-10-CM

## 2024-06-13 DIAGNOSIS — Z955 Presence of coronary angioplasty implant and graft: Secondary | ICD-10-CM

## 2024-06-13 DIAGNOSIS — K625 Hemorrhage of anus and rectum: Secondary | ICD-10-CM | POA: Diagnosis not present

## 2024-06-13 DIAGNOSIS — G9341 Metabolic encephalopathy: Secondary | ICD-10-CM | POA: Diagnosis not present

## 2024-06-13 DIAGNOSIS — D638 Anemia in other chronic diseases classified elsewhere: Secondary | ICD-10-CM | POA: Diagnosis present

## 2024-06-13 DIAGNOSIS — E871 Hypo-osmolality and hyponatremia: Secondary | ICD-10-CM | POA: Diagnosis present

## 2024-06-13 DIAGNOSIS — Z881 Allergy status to other antibiotic agents status: Secondary | ICD-10-CM

## 2024-06-13 DIAGNOSIS — R6889 Other general symptoms and signs: Secondary | ICD-10-CM | POA: Diagnosis not present

## 2024-06-13 DIAGNOSIS — Z7982 Long term (current) use of aspirin: Secondary | ICD-10-CM | POA: Diagnosis not present

## 2024-06-13 DIAGNOSIS — R531 Weakness: Secondary | ICD-10-CM | POA: Diagnosis not present

## 2024-06-13 DIAGNOSIS — I714 Abdominal aortic aneurysm, without rupture, unspecified: Secondary | ICD-10-CM | POA: Diagnosis present

## 2024-06-13 DIAGNOSIS — R54 Age-related physical debility: Secondary | ICD-10-CM | POA: Diagnosis present

## 2024-06-13 DIAGNOSIS — R4182 Altered mental status, unspecified: Secondary | ICD-10-CM

## 2024-06-13 DIAGNOSIS — R197 Diarrhea, unspecified: Secondary | ICD-10-CM

## 2024-06-13 DIAGNOSIS — A09 Infectious gastroenteritis and colitis, unspecified: Secondary | ICD-10-CM

## 2024-06-13 LAB — COMPREHENSIVE METABOLIC PANEL WITH GFR
ALT: 10 U/L (ref 0–44)
AST: 26 U/L (ref 15–41)
Albumin: 3.1 g/dL — ABNORMAL LOW (ref 3.5–5.0)
Alkaline Phosphatase: 57 U/L (ref 38–126)
Anion gap: 14 (ref 5–15)
BUN: 79 mg/dL — ABNORMAL HIGH (ref 8–23)
CO2: 15 mmol/L — ABNORMAL LOW (ref 22–32)
Calcium: 8.6 mg/dL — ABNORMAL LOW (ref 8.9–10.3)
Chloride: 104 mmol/L (ref 98–111)
Creatinine, Ser: 5.18 mg/dL — ABNORMAL HIGH (ref 0.44–1.00)
GFR, Estimated: 8 mL/min — ABNORMAL LOW (ref >60)
Glucose, Bld: 114 mg/dL — ABNORMAL HIGH (ref 70–99)
Potassium: 4.2 mmol/L (ref 3.5–5.1)
Sodium: 133 mmol/L — ABNORMAL LOW (ref 135–145)
Total Bilirubin: 0.9 mg/dL (ref 0.0–1.2)
Total Protein: 6 g/dL — ABNORMAL LOW (ref 6.5–8.1)

## 2024-06-13 LAB — CBC
HCT: 25.6 % — ABNORMAL LOW (ref 36.0–46.0)
Hemoglobin: 8 g/dL — ABNORMAL LOW (ref 12.0–15.0)
MCH: 30.7 pg (ref 26.0–34.0)
MCHC: 31.3 g/dL (ref 30.0–36.0)
MCV: 98.1 fL (ref 80.0–100.0)
Platelets: 207 K/uL (ref 150–400)
RBC: 2.61 MIL/uL — ABNORMAL LOW (ref 3.87–5.11)
RDW: 13.7 % (ref 11.5–15.5)
WBC: 13.9 K/uL — ABNORMAL HIGH (ref 4.0–10.5)
nRBC: 0 % (ref 0.0–0.2)

## 2024-06-13 LAB — URINALYSIS, ROUTINE W REFLEX MICROSCOPIC
Bilirubin Urine: NEGATIVE
Glucose, UA: NEGATIVE mg/dL
Ketones, ur: NEGATIVE mg/dL
Nitrite: NEGATIVE
Protein, ur: 100 mg/dL — AB
Specific Gravity, Urine: 1.014 (ref 1.005–1.030)
pH: 5 (ref 5.0–8.0)

## 2024-06-13 LAB — CK: Total CK: 69 U/L (ref 38–234)

## 2024-06-13 LAB — CBG MONITORING, ED: Glucose-Capillary: 107 mg/dL — ABNORMAL HIGH (ref 70–99)

## 2024-06-13 LAB — TSH: TSH: 2.225 u[IU]/mL (ref 0.350–4.500)

## 2024-06-13 MED ORDER — SODIUM CHLORIDE 0.9 % IV BOLUS
500.0000 mL | Freq: Once | INTRAVENOUS | Status: AC
  Administered 2024-06-13: 500 mL via INTRAVENOUS

## 2024-06-13 NOTE — ED Triage Notes (Addendum)
 Pt's son arrives and reports pt called him this morning c/o n/v and weakness. He reports pt normally takes care of herself and is very active. Pt is drowsy in triage and hard to answer triage questions. Pt on plavix 

## 2024-06-13 NOTE — ED Triage Notes (Signed)
 Pt arrives via ems c/o generalized weakness since 1000. Pt reports abd pain w/ n/v/d x 2-3 days.   Ems vitals 187/90 67 97 FSBS 133

## 2024-06-13 NOTE — ED Provider Notes (Signed)
 [facility] Provider Note    Event Date/Time   First MD Initiated Contact with Patient 06/13/24 2026     (approximate)   History   Abdominal Pain, Weakness, Emesis, and Diarrhea   HPI  Betty Nielsen is a 78 y.o. female past medical history significant for CKD stage V, hypertension, secondary hyperparathyroidism, anemia of chronic disease, hearing loss, who presents to the emergency department with generalized weakness and diarrhea.  History is provided by the patient's son at bedside.  States that she has been in general caring for herself at home.  Today was called over twice because she was having bad diarrhea and not feeling well.  States that every time he went over to her house today she was just sleeping and very difficult to get her up.  Does state that she had a prior history of a stroke but does not know her deficits.  States that she had been ambulating without significant assistance.  Does state that she has been told she has bad kidney disease in the past.  No known falls or head trauma.  Last known well was a couple of days ago.  Patient just states she is feels very tired.     Physical Exam   Triage Vital Signs: ED Triage Vitals  Encounter Vitals Group     BP 06/13/24 2008 (!) 164/79     Girls Systolic BP Percentile --      Girls Diastolic BP Percentile --      Boys Systolic BP Percentile --      Boys Diastolic BP Percentile --      Pulse Rate 06/13/24 2008 68     Resp 06/13/24 2008 18     Temp 06/13/24 2008 98.9 F (37.2 C)     Temp Source 06/13/24 2008 Oral     SpO2 06/13/24 2008 100 %     Weight --      Height --      Head Circumference --      Peak Flow --      Pain Score 06/13/24 2014 6     Pain Loc --      Pain Education --      Exclude from Growth Chart --     Most recent vital signs: Vitals:   06/13/24 2008 06/13/24 2040  BP: (!) 164/79   Pulse: 68   Resp: 18   Temp: 98.9 F (37.2 C)   SpO2: 100% 100%     Physical Exam Constitutional:      Appearance: She is well-developed.     Comments: Sleeping, easily arousable but immediately falls back asleep  HENT:     Head: Atraumatic.  Eyes:     Conjunctiva/sclera: Conjunctivae normal.  Cardiovascular:     Rate and Rhythm: Regular rhythm.  Pulmonary:     Effort: Pulmonary effort is normal. No respiratory distress.  Abdominal:     General: There is no distension.     Tenderness: There is no abdominal tenderness.  Musculoskeletal:        General: Normal range of motion.     Cervical back: Normal range of motion.  Skin:    General: Skin is warm.  Neurological:     Mental Status: Mental status is at baseline.     Comments: Weakness to the left upper and lower extremity.  Weakness also in the right lower extremity.  Difficult to get the patient to participate with most of the exam.  IMPRESSION / MDM / ASSESSMENT AND PLAN / ED COURSE  I reviewed the triage vital signs and the nursing notes.  Differential diagnosis including dehydration, viral illness including COVID/influenza, infectious diarrhea, C. difficile, intracranial hemorrhage, CVA  Patient appears to have left-sided weakness on exam.  Does have a history of CVA.  Is outside of the window for TNK given that her last known well was multiple days ago.  Does have a history of CVA but uncertain of what her chronic deficits are.   EKG  I, Clotilda Punter, the attending physician, personally viewed and interpreted this ECG.   Rate: Normal  Rhythm: Normal sinus  Axis: Normal  Intervals: Normal  ST&T Change: None  No tachycardic or bradycardic dysrhythmias while on cardiac telemetry.  RADIOLOGY CT scan of the head read as no acute findings  LABS (all labs ordered are listed, but only abnormal results are displayed) Labs interpreted as -    Labs Reviewed  C DIFFICILE QUICK SCREEN W PCR REFLEX   - Abnormal; Notable for the following components:      Result Value   C Diff  antigen POSITIVE (*)    All other components within normal limits  COMPREHENSIVE METABOLIC PANEL WITH GFR - Abnormal; Notable for the following components:   Sodium 133 (*)    CO2 15 (*)    Glucose, Bld 114 (*)    BUN 79 (*)    Creatinine, Ser 5.18 (*)    Calcium  8.6 (*)    Total Protein 6.0 (*)    Albumin 3.1 (*)    GFR, Estimated 8 (*)    All other components within normal limits  CBC - Abnormal; Notable for the following components:   WBC 13.9 (*)    RBC 2.61 (*)    Hemoglobin 8.0 (*)    HCT 25.6 (*)    All other components within normal limits  URINALYSIS, ROUTINE W REFLEX MICROSCOPIC - Abnormal; Notable for the following components:   Color, Urine AMBER (*)    APPearance HAZY (*)    Hgb urine dipstick SMALL (*)    Protein, ur 100 (*)    Leukocytes,Ua TRACE (*)    Bacteria, UA FEW (*)    All other components within normal limits  CBG MONITORING, ED - Abnormal; Notable for the following components:   Glucose-Capillary 107 (*)    All other components within normal limits  GASTROINTESTINAL PANEL BY PCR, STOOL (REPLACES STOOL CULTURE)  URINE CULTURE  CLOSTRIDIUM DIFFICILE BY PCR, REFLEXED  CK  TSH  MAGNESIUM  COMPREHENSIVE METABOLIC PANEL WITH GFR  CBC     MDM  Patient found to have acute kidney injury with a creatinine elevated up to 5 from a baseline of 3.6-4.2.  Does have a significantly elevated BUN of 80.  CO2 appears to be low chronically at 15.  Mild hyponatremia of 133.  Mild leukocytosis.  Anemia but hemoglobin appears to be stable  Urine currently pending.  CT scan of the head read as no acute findings.  Patient does appear clinically dehydrated on exam.  Of acute kidney injury with an elevated BUN, concerning that she is uremic and that is causing her to be very somnolent but arousable.  Is outside of the window for TNK.  Possibly a new CVA given left-sided weakness however also has weakness to her right lower extremity.  Consulted hospitalist for admission  for altered mental status, acute kidney injury and weakness     PROCEDURES:  Critical Care performed:  No  Procedures  Patient's presentation is most consistent with acute presentation with potential threat to life or bodily function.   MEDICATIONS ORDERED IN ED: Medications  vancomycin  (VANCOCIN ) capsule 125 mg (has no administration in time range)  pantoprazole  (PROTONIX ) injection 40 mg (has no administration in time range)  sodium bicarbonate  150 mEq in dextrose  5 % 1,150 mL infusion (has no administration in time range)  levothyroxine  (SYNTHROID ) tablet 75 mcg (has no administration in time range)  acetaminophen  (TYLENOL ) tablet 650 mg (has no administration in time range)    Or  acetaminophen  (TYLENOL ) suppository 650 mg (has no administration in time range)  ondansetron  (ZOFRAN ) tablet 4 mg (has no administration in time range)    Or  ondansetron  (ZOFRAN ) injection 4 mg (has no administration in time range)  enoxaparin  (LOVENOX ) injection 30 mg (has no administration in time range)  HYDROcodone -acetaminophen  (NORCO/VICODIN) 5-325 MG per tablet 1-2 tablet (has no administration in time range)  hydrALAZINE  (APRESOLINE ) injection 5 mg (has no administration in time range)  sodium chloride  0.9 % bolus 500 mL (0 mLs Intravenous Stopped 06/13/24 2319)  sodium chloride  0.9 % bolus 500 mL (500 mLs Intravenous Bolus from Bag 06/13/24 2330)    FINAL CLINICAL IMPRESSION(S) / ED DIAGNOSES   Final diagnoses:  Dehydration  Diarrhea, unspecified type  AKI (acute kidney injury)  Altered mental status, unspecified altered mental status type     Rx / DC Orders   ED Discharge Orders     None        Note:  This document was prepared using Dragon voice recognition software and may include unintentional dictation errors.   Suzanne Kirsch, MD 06/14/24 [phone]

## 2024-06-14 ENCOUNTER — Encounter: Payer: Self-pay | Admitting: Oncology

## 2024-06-14 ENCOUNTER — Other Ambulatory Visit (HOSPITAL_COMMUNITY): Payer: Self-pay

## 2024-06-14 ENCOUNTER — Telehealth (HOSPITAL_COMMUNITY): Payer: Self-pay | Admitting: Pharmacy Technician

## 2024-06-14 DIAGNOSIS — A09 Infectious gastroenteritis and colitis, unspecified: Secondary | ICD-10-CM

## 2024-06-14 DIAGNOSIS — I2510 Atherosclerotic heart disease of native coronary artery without angina pectoris: Secondary | ICD-10-CM | POA: Diagnosis not present

## 2024-06-14 DIAGNOSIS — N186 End stage renal disease: Secondary | ICD-10-CM | POA: Diagnosis not present

## 2024-06-14 DIAGNOSIS — I714 Abdominal aortic aneurysm, without rupture, unspecified: Secondary | ICD-10-CM

## 2024-06-14 DIAGNOSIS — I10 Essential (primary) hypertension: Secondary | ICD-10-CM | POA: Diagnosis not present

## 2024-06-14 DIAGNOSIS — A040 Enteropathogenic Escherichia coli infection: Secondary | ICD-10-CM | POA: Diagnosis present

## 2024-06-14 DIAGNOSIS — Z8673 Personal history of transient ischemic attack (TIA), and cerebral infarction without residual deficits: Secondary | ICD-10-CM

## 2024-06-14 DIAGNOSIS — I120 Hypertensive chronic kidney disease with stage 5 chronic kidney disease or end stage renal disease: Secondary | ICD-10-CM | POA: Diagnosis not present

## 2024-06-14 DIAGNOSIS — D638 Anemia in other chronic diseases classified elsewhere: Secondary | ICD-10-CM

## 2024-06-14 DIAGNOSIS — E1122 Type 2 diabetes mellitus with diabetic chronic kidney disease: Secondary | ICD-10-CM | POA: Diagnosis present

## 2024-06-14 DIAGNOSIS — Z79899 Other long term (current) drug therapy: Secondary | ICD-10-CM | POA: Diagnosis not present

## 2024-06-14 DIAGNOSIS — N185 Chronic kidney disease, stage 5: Secondary | ICD-10-CM | POA: Diagnosis not present

## 2024-06-14 DIAGNOSIS — D62 Acute posthemorrhagic anemia: Secondary | ICD-10-CM | POA: Diagnosis not present

## 2024-06-14 DIAGNOSIS — D631 Anemia in chronic kidney disease: Secondary | ICD-10-CM | POA: Diagnosis not present

## 2024-06-14 DIAGNOSIS — E663 Overweight: Secondary | ICD-10-CM | POA: Insufficient documentation

## 2024-06-14 DIAGNOSIS — N184 Chronic kidney disease, stage 4 (severe): Secondary | ICD-10-CM | POA: Diagnosis not present

## 2024-06-14 DIAGNOSIS — G9341 Metabolic encephalopathy: Secondary | ICD-10-CM | POA: Diagnosis not present

## 2024-06-14 DIAGNOSIS — K922 Gastrointestinal hemorrhage, unspecified: Secondary | ICD-10-CM | POA: Diagnosis not present

## 2024-06-14 DIAGNOSIS — N3001 Acute cystitis with hematuria: Secondary | ICD-10-CM | POA: Diagnosis not present

## 2024-06-14 DIAGNOSIS — R197 Diarrhea, unspecified: Secondary | ICD-10-CM | POA: Diagnosis not present

## 2024-06-14 DIAGNOSIS — E876 Hypokalemia: Secondary | ICD-10-CM | POA: Diagnosis not present

## 2024-06-14 DIAGNOSIS — E890 Postprocedural hypothyroidism: Secondary | ICD-10-CM | POA: Diagnosis not present

## 2024-06-14 DIAGNOSIS — N179 Acute kidney failure, unspecified: Secondary | ICD-10-CM

## 2024-06-14 DIAGNOSIS — E8721 Acute metabolic acidosis: Secondary | ICD-10-CM

## 2024-06-14 DIAGNOSIS — E785 Hyperlipidemia, unspecified: Secondary | ICD-10-CM | POA: Diagnosis present

## 2024-06-14 DIAGNOSIS — Z7982 Long term (current) use of aspirin: Secondary | ICD-10-CM | POA: Diagnosis not present

## 2024-06-14 DIAGNOSIS — K625 Hemorrhage of anus and rectum: Secondary | ICD-10-CM | POA: Diagnosis not present

## 2024-06-14 DIAGNOSIS — E860 Dehydration: Secondary | ICD-10-CM | POA: Diagnosis not present

## 2024-06-14 DIAGNOSIS — Z79890 Hormone replacement therapy (postmenopausal): Secondary | ICD-10-CM | POA: Diagnosis not present

## 2024-06-14 DIAGNOSIS — Z515 Encounter for palliative care: Secondary | ICD-10-CM | POA: Diagnosis not present

## 2024-06-14 DIAGNOSIS — E871 Hypo-osmolality and hyponatremia: Secondary | ICD-10-CM | POA: Diagnosis present

## 2024-06-14 DIAGNOSIS — N2581 Secondary hyperparathyroidism of renal origin: Secondary | ICD-10-CM | POA: Diagnosis present

## 2024-06-14 DIAGNOSIS — Z87891 Personal history of nicotine dependence: Secondary | ICD-10-CM | POA: Diagnosis not present

## 2024-06-14 DIAGNOSIS — R4182 Altered mental status, unspecified: Secondary | ICD-10-CM | POA: Diagnosis not present

## 2024-06-14 DIAGNOSIS — Z7902 Long term (current) use of antithrombotics/antiplatelets: Secondary | ICD-10-CM | POA: Diagnosis not present

## 2024-06-14 LAB — COMPREHENSIVE METABOLIC PANEL WITH GFR
ALT: 8 U/L (ref 0–44)
AST: 23 U/L (ref 15–41)
Albumin: 2.7 g/dL — ABNORMAL LOW (ref 3.5–5.0)
Alkaline Phosphatase: 49 U/L (ref 38–126)
Anion gap: 16 — ABNORMAL HIGH (ref 5–15)
BUN: 80 mg/dL — ABNORMAL HIGH (ref 8–23)
CO2: 15 mmol/L — ABNORMAL LOW (ref 22–32)
Calcium: 7.8 mg/dL — ABNORMAL LOW (ref 8.9–10.3)
Chloride: 106 mmol/L (ref 98–111)
Creatinine, Ser: 5.05 mg/dL — ABNORMAL HIGH (ref 0.44–1.00)
GFR, Estimated: 8 mL/min — ABNORMAL LOW (ref >60)
Glucose, Bld: 149 mg/dL — ABNORMAL HIGH (ref 70–99)
Potassium: 3.4 mmol/L — ABNORMAL LOW (ref 3.5–5.1)
Sodium: 137 mmol/L (ref 135–145)
Total Bilirubin: 0.7 mg/dL (ref 0.0–1.2)
Total Protein: 5.2 g/dL — ABNORMAL LOW (ref 6.5–8.1)

## 2024-06-14 LAB — GASTROINTESTINAL PANEL BY PCR, STOOL (REPLACES STOOL CULTURE)

## 2024-06-14 LAB — CBC
HCT: 22.2 % — ABNORMAL LOW (ref 36.0–46.0)
Hemoglobin: 7 g/dL — ABNORMAL LOW (ref 12.0–15.0)
MCH: 30.2 pg (ref 26.0–34.0)
MCHC: 31.5 g/dL (ref 30.0–36.0)
MCV: 95.7 fL (ref 80.0–100.0)
Platelets: 194 K/uL (ref 150–400)
RBC: 2.32 MIL/uL — ABNORMAL LOW (ref 3.87–5.11)
RDW: 13.7 % (ref 11.5–15.5)
WBC: 15.4 K/uL — ABNORMAL HIGH (ref 4.0–10.5)
nRBC: 0 % (ref 0.0–0.2)

## 2024-06-14 LAB — C DIFFICILE QUICK SCREEN W PCR REFLEX
C Diff antigen: POSITIVE — AB
C Diff toxin: NEGATIVE

## 2024-06-14 LAB — CLOSTRIDIUM DIFFICILE BY PCR, REFLEXED
Hypervirulent Strain: NEGATIVE
Toxigenic C. Difficile by PCR: NEGATIVE

## 2024-06-14 LAB — MAGNESIUM: Magnesium: 4.1 mg/dL — ABNORMAL HIGH (ref 1.7–2.4)

## 2024-06-14 MED ORDER — ACETAMINOPHEN 650 MG RE SUPP: 650.0000 mg | Freq: Four times a day (QID) | RECTAL | Status: DC | PRN

## 2024-06-14 MED ORDER — PANTOPRAZOLE SODIUM 40 MG IV SOLR
40.0000 mg | INTRAVENOUS | Status: DC
  Administered 2024-06-14 – 2024-06-18 (×5): 40 mg via INTRAVENOUS
  Filled 2024-06-14 (×5): qty 10

## 2024-06-14 MED ORDER — ONDANSETRON HCL 4 MG PO TABS: 4.0000 mg | ORAL_TABLET | Freq: Four times a day (QID) | ORAL | Status: DC | PRN

## 2024-06-14 MED ORDER — VANCOMYCIN HCL 125 MG PO CAPS
125.0000 mg | ORAL_CAPSULE | Freq: Four times a day (QID) | ORAL | Status: DC
  Administered 2024-06-14 – 2024-06-20 (×24): 125 mg via ORAL
  Filled 2024-06-14 (×28): qty 1

## 2024-06-14 MED ORDER — SODIUM CHLORIDE 0.9 % IV SOLN
2.0000 g | INTRAVENOUS | Status: AC
  Administered 2024-06-14 – 2024-06-16 (×3): 2 g via INTRAVENOUS
  Filled 2024-06-14 (×3): qty 20

## 2024-06-14 MED ORDER — SODIUM BICARBONATE 8.4 % IV SOLN
INTRAVENOUS | Status: DC
  Filled 2024-06-14: qty 150
  Filled 2024-06-14: qty 1000

## 2024-06-14 MED ORDER — LEVOTHYROXINE SODIUM 50 MCG PO TABS
75.0000 ug | ORAL_TABLET | Freq: Every day | ORAL | Status: DC
  Administered 2024-06-15 – 2024-06-22 (×8): 75 ug via ORAL
  Filled 2024-06-14 (×8): qty 1

## 2024-06-14 MED ORDER — AZITHROMYCIN 500 MG PO TABS
500.0000 mg | ORAL_TABLET | Freq: Every day | ORAL | Status: DC
  Filled 2024-06-14: qty 1

## 2024-06-14 MED ORDER — ACETAMINOPHEN 325 MG PO TABS
650.0000 mg | ORAL_TABLET | Freq: Four times a day (QID) | ORAL | Status: DC | PRN
  Administered 2024-06-17: 650 mg via ORAL
  Filled 2024-06-14: qty 2

## 2024-06-14 MED ORDER — SODIUM BICARBONATE 8.4 % IV SOLN
INTRAVENOUS | Status: DC
  Filled 2024-06-14 (×2): qty 150
  Filled 2024-06-14 (×2): qty 1000

## 2024-06-14 MED ORDER — ENOXAPARIN SODIUM 30 MG/0.3ML IJ SOSY: 30.0000 mg | PREFILLED_SYRINGE | INTRAMUSCULAR | Status: DC

## 2024-06-14 MED ORDER — HEPARIN SODIUM (PORCINE) 5000 UNIT/ML IJ SOLN
5000.0000 [IU] | Freq: Three times a day (TID) | INTRAMUSCULAR | Status: DC
  Administered 2024-06-14 – 2024-06-18 (×13): 5000 [IU] via SUBCUTANEOUS
  Filled 2024-06-14 (×13): qty 1

## 2024-06-14 MED ORDER — HYDROCODONE-ACETAMINOPHEN 5-325 MG PO TABS
1.0000 | ORAL_TABLET | ORAL | Status: DC | PRN
  Administered 2024-06-14 – 2024-06-16 (×3): 2 via ORAL
  Administered 2024-06-18 – 2024-06-19 (×3): 1 via ORAL
  Filled 2024-06-14: qty 2
  Filled 2024-06-14: qty 1
  Filled 2024-06-14 (×2): qty 2
  Filled 2024-06-14 (×2): qty 1

## 2024-06-14 MED ORDER — ONDANSETRON HCL 4 MG/2ML IJ SOLN
4.0000 mg | Freq: Four times a day (QID) | INTRAMUSCULAR | Status: DC | PRN
  Administered 2024-06-14 – 2024-06-15 (×2): 4 mg via INTRAVENOUS
  Filled 2024-06-14 (×2): qty 2

## 2024-06-14 MED ORDER — HYDRALAZINE HCL 20 MG/ML IJ SOLN
5.0000 mg | INTRAMUSCULAR | Status: DC | PRN
  Administered 2024-06-14 – 2024-06-22 (×11): 5 mg via INTRAVENOUS
  Filled 2024-06-14 (×12): qty 1

## 2024-06-14 NOTE — Assessment & Plan Note (Signed)
BMI 27.46

## 2024-06-14 NOTE — Progress Notes (Signed)
 Progress Note   Patient: Betty Nielsen FMW:[id number] DOB: 23-Jul-1946 DOA: 06/13/2024     0 DOS: the patient was seen and examined on 06/14/2024   Brief hospital course: 78 y.o. female with medical history significant for HTN, AAA s/p endovascular repair, prior stroke, CAD surgical hypothyroidism, CKD 5 who has declined dialysis planning referred by her nephrologist, hypothyroidism, anemia of chronic disease, lumbar spinal stenosis, being admitted with acute encephalopathy  in the setting of acute diarrhea.  She was brought in by her son with generalized weakness, increased somnolence and nausea vomiting and diarrhea starting earlier in the day.  Patient is very somnolent and unable to contribute to the history. In the ED, mild BP elevation to 164/79 with otherwise normal vitals. Labs notable for worsening renal function with creatinine of 5.1 up from most recent baseline of 3-4 with bicarb of 15, BUN 79.  Potassium normal..  WBC 13.9.  Hemoglobin at baseline of 8.  Urinalysis showing trace leukocytes few bacteria, protein.  CK 69. EKG with NSR at 65 CT head nonacute Chest x-ray not done Patient was given a 500 cc NS bolus Admission requested   9/25.  Patient having a lot of diarrhea and rectal tube was placed.  Started empirically on vancomycin  even though C. difficile testing was negative.  Enteropathogenic E. coli growing out of culture.  Started on Rocephin  for 3 days.  Assessment and Plan: * Acute infective gastroenteritis Enteropathogenic E coli Rectal tube for watery diarrhea, IV fluid hydration Started on Rocephin  for enteropathogenic E. coli.  Empirically placed on vancomycin  even though C. difficile is negative since patient is sick.  Acute metabolic encephalopathy Secondary to dehydration from acute gastrointestinal illness, lower suspicion for uremic encephalopathy.  Restart diet  Acute metabolic acidosis Secondary to worsening creatinine.  Continue IV fluid hydration  Acute  renal failure superimposed on stage 4 chronic kidney disease (HCC) Acute metabolic acidosis Secondary to GI losses, baseline worsening of CKD creatinine of 5.1 up from most recent baseline of 3-4 with bicarb of 15, BUN 79 IV hydration with bicarb Nephrology consulted  Hypothyroidism, postsurgical Continue levothyroxine   Anemia of chronic disease Hemoglobin down to 7.0.  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  Having diarrhea going on for 2 weeks.  Everything she eats goes through her.  Physical Exam: Vitals:   06/14/24 1044 06/14/24 1049 06/14/24 1051 06/14/24 1110  BP: (!) 193/80  (!) 195/78 (!) 165/72  Pulse: 70  68   Resp:  18 18 20   Temp: 98.3 F (36.8 C)  98.3 F (36.8 C)   TempSrc:   Oral   SpO2: 100%  100%   Weight:       Physical Exam HENT:     Head: Normocephalic.     Mouth/Throat:     Pharynx: No oropharyngeal exudate.  Eyes:     General: Lids are normal.     Conjunctiva/sclera: Conjunctivae normal.  Cardiovascular:     Rate and Rhythm: Normal rate and regular rhythm.     Heart sounds: Normal heart sounds, S1 normal and S2 normal.  Pulmonary:     Breath sounds: No decreased breath sounds, wheezing, rhonchi or rales.  Abdominal:  Palpations: Abdomen is soft.     Tenderness: There is no abdominal tenderness.  Musculoskeletal:     Right lower leg: No swelling.     Left lower leg: No swelling.  Skin:    General: Skin is warm.     Findings: No rash.  Neurological:     Mental Status: She is alert and oriented to person, place, and time.     Data Reviewed: Creatinine 5.05, potassium 3.4, CO2 15, white  blood cell count 15.4, hemoglobin 7.0, platelet count 194  Family Communication: Tried to call son.  Disposition: Status is: Observation Patient with severe diarrhea and has rectal tube in  Planned Discharge Destination: Home    Time spent: 28 minutes  Author: Charlie Patterson, MD 06/14/2024 12:15 PM  For on call review [email or web address].

## 2024-06-14 NOTE — Assessment & Plan Note (Addendum)
 Continue levothyroxine 

## 2024-06-14 NOTE — Hospital Course (Addendum)
 78 y.o. female with medical history significant for HTN, AAA s/p endovascular repair, prior stroke, CAD surgical hypothyroidism, CKD 5 who has declined dialysis planning referred by her nephrologist, hypothyroidism, anemia of chronic disease, lumbar spinal stenosis, being admitted with acute encephalopathy  in the setting of acute diarrhea.  She was brought in by her son with generalized weakness, increased somnolence and nausea vomiting and diarrhea starting earlier in the day.  Patient is very somnolent and unable to contribute to the history. In the ED, mild BP elevation to 164/79 with otherwise normal vitals. Labs notable for worsening renal function with creatinine of 5.1 up from most recent baseline of 3-4 with bicarb of 15, BUN 79.  Potassium normal..  WBC 13.9.  Hemoglobin at baseline of 8.  Urinalysis showing trace leukocytes few bacteria, protein.  CK 69. EKG with NSR at 65 CT head nonacute Chest x-ray not done Patient was given a 500 cc NS bolus Admission requested   9/25.  Patient having a lot of diarrhea and rectal tube was placed.  Started empirically on vancomycin  even though C. difficile testing was negative.  Enteropathogenic E. coli growing out of culture.  Started on Rocephin  for 3 days. 9/26.  Patient declined blood transfusion.  Had rectal tube in on the morning but came out in the afternoon.  Physical therapy recommending rehab 9/27.  Hemoglobin down to 6.3.  Agreeable to blood transfusion.  Creatinine has not improved.  Has declined dialysis. 9/28.  Agreeable to another unit of blood with hemoglobin 7.2. 9/29.  Hemoglobin 8.2 today.  Creatinine still impaired at 5.54.  Spoke with nephrology and will restart gentle fluid. 9/30.  Had rectal bleeding overnight.  Holding aspirin , Plavix  and heparin  subcutaneous injection.  Hemoglobin 7.0.  Will give 1 unit of packed red blood cells today

## 2024-06-14 NOTE — Assessment & Plan Note (Addendum)
 Secondary to worsening creatinine.  Acidosis improved with hydration.

## 2024-06-14 NOTE — Telephone Encounter (Signed)
 Patient Product/process development scientist completed.    The patient is insured through [facility]. Patient has Medicare and is not eligible for a copay card, but may be able to apply for patient assistance or Medicare RX Payment Plan (Patient Must reach out to their plan, if eligible for payment plan), if available.    Ran test claim for vancomycin 125 mg and the current 10 day co-pay is $108.96 due to a deductible.   This test claim was processed through [facility]- copay amounts may vary at other pharmacies due to pharmacy/plan contracts, or as the patient moves through the different stages of their insurance plan.     Roland Earl, CPHT Pharmacy Technician III Certified Patient Advocate [facility] Patient Advocate Team Direct Number: [phone]  Fax: [phone]

## 2024-06-14 NOTE — Assessment & Plan Note (Addendum)
 Hold blood thinners with rectal bleeding.  Continue atorvastatin 

## 2024-06-14 NOTE — H&P (Addendum)
 History and Physical    Patient: Betty Nielsen FMW:[id number] DOB: [birth year] DOA: 06/13/2024 DOS: the patient was seen and examined on 06/14/2024 PCP: [name], MD  Patient coming from: Home  Chief Complaint:  Chief Complaint  Patient presents with   Abdominal Pain   Weakness   Emesis   Diarrhea    HPI: Betty Nielsen is a 78 y.o. female with medical history significant for HTN, AAA s/p endovascular repair, prior stroke, CAD surgical hypothyroidism, CKD 5 who has declined dialysis planning referred by her nephrologist, hypothyroidism, anemia of chronic disease, lumbar spinal stenosis, being admitted with acute encephalopathy  in the setting of acute diarrhea.  She was brought in by her son with generalized weakness, increased somnolence and nausea vomiting and diarrhea starting earlier in the day.  Patient is very somnolent and unable to contribute to the history. In the ED, mild BP elevation to 164/79 with otherwise normal vitals. Labs notable for worsening renal function with creatinine of 5.1 up from most recent baseline of 3-4 with bicarb of 15, BUN 79.  Potassium normal..  WBC 13.9.  Hemoglobin at baseline of 8.  Urinalysis showing trace leukocytes few bacteria, protein.  CK 69. EKG with NSR at 65 CT head nonacute Chest x-ray not done Patient was given a 500 cc NS bolus Admission requested    Past Medical History:  Diagnosis Date   Arthrosis of knee    Chronic kidney disease    Hyperlipidemia    Hypertension    Loss of hearing    Metabolic syndrome    Plantar fasciitis    Renal insufficiency    Past Surgical History:  Procedure Laterality Date   ABDOMINAL AORTIC ANEURYSM REPAIR     ABDOMINAL HYSTERECTOMY     APPENDECTOMY     CORONARY STENT INTERVENTION N/A 10/26/2016   Procedure: Coronary Stent Intervention;  Surgeon: Cara JONETTA Lovelace, MD;  Location: [facility];  Service: Cardiovascular;  Laterality: N/A;   HERNIA REPAIR     INNER EAR SURGERY      pt not sure of type   LEFT HEART CATH AND CORONARY ANGIOGRAPHY N/A 10/26/2016   Procedure: Left Heart Cath and Coronary Angiography;  Surgeon: Vinie DELENA Jude, MD;  Location: [facility];  Service: Cardiovascular;  Laterality: N/A;   RENAL ANGIOGRAPHY N/A 06/11/2021   Procedure: RENAL ANGIOGRAPHY;  Surgeon: Marea Selinda RAMAN, MD;  Location: [facility];  Service: Cardiovascular;  Laterality: N/A;   THYROIDECTOMY     Social History:  reports that she quit smoking about 25 years ago. Her smoking use included cigarettes. She started smoking about 45 years ago. She has a 10 pack-year smoking history. She has never used smokeless tobacco. She reports that she does not drink alcohol and does not use drugs.  Allergies  Allergen Reactions   Ace Inhibitors Cough   Ciprofloxacin Other (See Comments)    unknown   Citalopram      hyponatremia   Hydrocodone -Acetaminophen  Other (See Comments)    unknown   Norvasc [Amlodipine Besylate]     Pruritus    Pantoprazole  Sodium Other (See Comments)    Cramps   Pravastatin Other (See Comments)    Stomach cramps   Sulfa Antibiotics     unknown   Clonidine  Other (See Comments)    Per patient told by allergist that would be best to stop but nephrologist recommended her to stay on medication     Family History  Problem Relation Age  of Onset   Healthy Mother    Cerebral aneurysm Father     Prior to Admission medications   Medication Sig Start Date End Date Taking? Authorizing Provider  acetaminophen  (TYLENOL ) 650 MG CR tablet Take 1 tablet (650 mg total) by mouth every 8 (eight) hours as needed for pain. 06/29/23   [name], MD  aspirin  EC 81 MG tablet Take 81 mg by mouth daily. Swallow whole.    [provider]  atorvastatin  (LIPITOR) 40 MG tablet TAKE 1 TABLET BY MOUTH ONCE DAILY AT  6PM.  CHOLESTEROL  MEDICATION. 04/18/24   Sowles, Krichna, MD  calcitRIOL  (ROCALTROL ) 0.25 MCG capsule Take 0.25 mcg by mouth daily.    [provider]  Cholecalciferol  125 MCG (5000 UT) TABS Take 5,000 Units by mouth daily.    [provider]  cloNIDine  (CATAPRES ) 0.2 MG tablet Take 1 tablet (0.2 mg total) by mouth 2 (two) times daily. 04/18/24   Sowles, Krichna, MD  clopidogrel  (PLAVIX ) 75 MG tablet TAKE 1 TABLET BY MOUTH ONCE DAILY .  BLOOD  THINNER  TO  HELP  REDUCE  STROKE  RISK 04/18/24   Sowles, Krichna, MD  ezetimibe  (ZETIA ) 10 MG tablet Take 1 tablet (10 mg total) by mouth daily. 12/30/23   Sowles, Krichna, MD  labetalol  (NORMODYNE ) 300 MG tablet Take 1 tablet (300 mg total) by mouth 2 (two) times daily. 12/30/23   Sowles, Krichna, MD  latanoprost  (XALATAN ) 0.005 % ophthalmic solution Place 1 drop into both eyes at bedtime. 05/30/23   [provider]  levothyroxine  (SYNTHROID ) 75 MCG tablet Take 1 tablet (75 mcg total) by mouth daily before breakfast. 01/17/24   Glenard, Krichna, MD  senna-docusate (SENOKOT-S) 8.6-50 MG tablet Take 1 tablet by mouth at bedtime as needed for mild constipation. 06/29/23   [name], MD    Physical Exam: Vitals:   06/13/24 2008 06/13/24 2040  BP: (!) 164/79   Pulse: 68   Resp: 18   Temp: 98.9 F (37.2 C)   TempSrc: Oral   SpO2: 100% 100%   Physical Exam Vitals and nursing note reviewed.  Constitutional:      General: She is not in acute distress. HENT:     Head: Normocephalic and atraumatic.  Cardiovascular:     Rate and Rhythm: Normal rate and regular rhythm.     Heart sounds: Normal heart sounds.  Pulmonary:     Effort: Pulmonary effort is normal.     Breath sounds: Normal breath sounds.  Abdominal:     Palpations: Abdomen is soft.     Tenderness: There is no abdominal tenderness.  Neurological:     Mental Status: She is lethargic.     Labs on Admission: I have personally reviewed following labs and imaging studies  CBC: Recent Labs  Lab 06/13/24 2021  WBC 13.9*  HGB 8.0*  HCT 25.6*  MCV 98.1  PLT 207   Basic Metabolic Panel: Recent Labs   Lab 06/13/24 2021  NA 133*  K 4.2  CL 104  CO2 15*  GLUCOSE 114*  BUN 79*  CREATININE 5.18*  CALCIUM  8.6*   GFR: CrCl cannot be calculated (Unknown ideal weight.). Liver Function Tests: Recent Labs  Lab 06/13/24 2021  AST 26  ALT 10  ALKPHOS 57  BILITOT 0.9  PROT 6.0*  ALBUMIN 3.1*   No results for input(s): LIPASE, AMYLASE in the last 168 hours. No results for input(s): AMMONIA in the last 168 hours. Coagulation Profile:  No results for input(s): INR, PROTIME in the last 168 hours. Cardiac Enzymes: Recent Labs  Lab 06/13/24 2021  CKTOTAL 69   BNP (last 3 results) No results for input(s): PROBNP in the last 8760 hours. HbA1C: No results for input(s): HGBA1C in the last 72 hours. CBG: Recent Labs  Lab 06/13/24 2053  GLUCAP 107*   Lipid Profile: No results for input(s): CHOL, HDL, LDLCALC, TRIG, CHOLHDL, LDLDIRECT in the last 72 hours. Thyroid  Function Tests: Recent Labs    06/13/24 2021  TSH 2.225   Anemia Panel: No results for input(s): VITAMINB12, FOLATE, FERRITIN, TIBC, IRON , RETICCTPCT in the last 72 hours. Urine analysis:    Component Value Date/Time   COLORURINE AMBER (A) 06/13/2024 2149   APPEARANCEUR HAZY (A) 06/13/2024 2149   APPEARANCEUR Clear 04/04/2012 1438   LABSPEC 1.014 06/13/2024 2149   LABSPEC 1.004 04/04/2012 1438   PHURINE 5.0 06/13/2024 2149   GLUCOSEU NEGATIVE 06/13/2024 2149   GLUCOSEU Negative 04/04/2012 1438   HGBUR SMALL (A) 06/13/2024 2149   BILIRUBINUR NEGATIVE 06/13/2024 2149   BILIRUBINUR Negative 04/04/2012 1438   KETONESUR NEGATIVE 06/13/2024 2149   PROTEINUR 100 (A) 06/13/2024 2149   UROBILINOGEN 0.2 04/15/2008 0946   NITRITE NEGATIVE 06/13/2024 2149   LEUKOCYTESUR TRACE (A) 06/13/2024 2149   LEUKOCYTESUR Trace 04/04/2012 1438    Radiological Exams on Admission: CT HEAD WO CONTRAST Result Date: 06/13/2024 EXAM: CT HEAD WITHOUT CONTRAST 06/13/2024 08:44:22 PM TECHNIQUE:  CT of the head was performed without the administration of intravenous contrast. Automated exposure control, iterative reconstruction, and/or weight based adjustment of the mA/kV was utilized to reduce the radiation dose to as low as reasonably achievable. COMPARISON: None available. CLINICAL HISTORY: Mental status change, unknown cause; weakness. Pt's son arrives and reports pt called him this morning c/o n/v and weakness. He reports pt normally takes care of herself and is very active. Pt is drowsy in triage and hard to answer triage questions. Pt on plavix . FINDINGS: BRAIN AND VENTRICLES: No acute hemorrhage. No evidence of acute infarct. No hydrocephalus. No extra-axial collection. No mass effect or midline shift. Mild age-related atrophy. Subcortical and periventricular small vessel ischemic changes. Chronic left thalamic lacunar infarct. Benign dural calcifications. ORBITS: No acute abnormality. SINUSES: No acute abnormality. SOFT TISSUES AND SKULL: No acute soft tissue abnormality. No skull fracture. IMPRESSION: 1. No acute intracranial abnormality. Electronically signed by: Pinkie Pebbles MD 06/13/2024 08:56 PM EDT RP Workstation: [id number]   Data Reviewed for HPI: Relevant notes from primary care and specialist visits, past discharge summaries as available in EHR, including Care Everywhere. Prior diagnostic testing as pertinent to current admission diagnoses Updated medications and problem lists for reconciliation ED course, including vitals, labs, imaging, treatment and response to treatment Triage notes, nursing and pharmacy notes and ED provider's notes Notable results as noted above in HPI      Assessment and Plan: * Acute infective gastroenteritis Enteropathogenic E coli C. difficile positive antigen negative Will treat empirically with oral vancomycin  Supportive care with IV fluids, IV antiemetics, IV Protonix  Azithromycin  500mg  daily x 3 days Consider ID consult Contact  precautions Addendum: Rectal tube placed for profuse watery diarrhea  Acute metabolic encephalopathy Secondary to dehydration from acute gastrointestinal illness, lower suspicion for uremic encephalopathy N.p.o. 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 Observation status is judged to be reasonable and necessary in order to provide the required intensity of service to ensure the patient's safety. The patient's presenting symptoms, physical exam findings, and initial radiographic and laboratory data in the context of their medical condition is felt to place them at decreased risk for further clinical deterioration. Furthermore, it is anticipated that the patient will be medically stable for discharge from the hospital within 2 midnights of admission.   Author: Delayne LULLA Solian, MD 06/14/2024 12:31 AM  For on call review [email or web address].

## 2024-06-14 NOTE — Progress Notes (Signed)
 PT Cancellation Note  Patient Details Name: Betty Nielsen MRN: [id number] DOB: 03/10/1946   Cancelled Treatment:    Reason Eval/Treat Not Completed: Fatigue/lethargy limiting ability to participate (Consult received and chart reviewed. Patient sleeping upon arrival.  Opens eyes briefly to inconsistently answer simple questions; almost immediately closes eyes back and returns back to sleep.  Unable/unwilling to maintain alertness for participation with session at this time.  Will continue efforts at later time/date as medically appropriate.)  Of note, assisted patient with scooting up in bed, dependant care/assist from therapist; anticipate heavy +2 for assessment/progression of functional mobility.  Betty Nielsen, PT, DPT, NCS 06/14/24, 3:19 PM [phone]

## 2024-06-14 NOTE — Assessment & Plan Note (Addendum)
 Patient transfused 2 units of packed red blood cells during the hospital course.  Hemoglobin 8.2

## 2024-06-14 NOTE — Progress Notes (Signed)
 [facility]  ROUNDING NOTE   Subjective:   Betty Nielsen is a 78 year old female with past medical conditions including CAD, hypertension, stroke, and chronic kidney disease stage V.  Patient presents to the emergency department complaining of abdominal pain, diarrhea and weakness.  She has been admitted for Dehydration [E86.0] AKI (acute kidney injury) [N17.9] Altered mental status, unspecified altered mental status type [R41.82] Acute metabolic encephalopathy [G93.41] Diarrhea, unspecified type [R19.7]  Patient is known to our practice and is followed outpatient by Dr. Douglas.  Patient was last seen in office in May for routine follow-up.  Patient is seen laying in bed in emergency department.  Ill-appearing.  Alert and oriented to self and place.  Appears weak and unable to follow conversation.  Chart review used to obtain history.  Appears patient was brought to emergency department by her son due to weakness and somnolence.  Son had stated nausea, vomiting, and diarrhea that started the day prior to presentation.  Labs on ED arrival concerning for sodium 133, serum bicarb, BUN 79, creatinine 5.18 with GFR 8, white count 13.9 and hemoglobin 8.0.  UA appears hazy with mild proteinuria.  C. difficile antigen positive.  CT head negative for acute findings.  We have been consulted to manage acute kidney injury.   Objective:  Vital signs in last 24 hours:  Temp:  [97.7 F (36.5 C)-98.9 F (37.2 C)] 97.8 F (36.6 C) (09/25 1329) Pulse Rate:  [68-74] 74 (09/25 1329) Resp:  [14-28] 20 (09/25 1110) BP: (141-203)/(51-82) 173/68 (09/25 1329) SpO2:  [99 %-100 %] 99 % (09/25 1329) Weight:  [70.3 kg] 70.3 kg (09/25 0814)  Weight change:  Filed Weights   06/14/24 0814  Weight: 70.3 kg    Intake/Output: I/O last 3 completed shifts: In: 500 [IV Piggyback:500] Out: 200 [Stool:200]   Intake/Output this shift:  Total I/O In: 1222.5 [I.V.:1122.5; IV Piggyback:100] Out: -    Physical Exam: General: Ill-appearing  Head: Normocephalic, atraumatic. Moist oral mucosal membranes  Eyes: Anicteric  Lungs:  Clear to auscultation, normal effort  Heart: Regular rate and rhythm  Abdomen:  Soft, nontender  Extremities: No peripheral edema.  Neurologic: Awake, alert, conversant  Skin: Warm,dry, no rash  Access: None    Basic Metabolic Panel: Recent Labs  Lab 06/13/24 2021 06/14/24 0458  NA 133* 137  K 4.2 3.4*  CL 104 106  CO2 15* 15*  GLUCOSE 114* 149*  BUN 79* 80*  CREATININE 5.18* 5.05*  CALCIUM  8.6* 7.8*  MG 4.1*  --     Liver Function Tests: Recent Labs  Lab 06/13/24 2021 06/14/24 0458  AST 26 23  ALT 10 8  ALKPHOS 57 49  BILITOT 0.9 0.7  PROT 6.0* 5.2*  ALBUMIN 3.1* 2.7*   No results for input(s): LIPASE, AMYLASE in the last 168 hours. No results for input(s): AMMONIA in the last 168 hours.  CBC: Recent Labs  Lab 06/13/24 2021 06/14/24 0458  WBC 13.9* 15.4*  HGB 8.0* 7.0*  HCT 25.6* 22.2*  MCV 98.1 95.7  PLT 207 194    Cardiac Enzymes: Recent Labs  Lab 06/13/24 2021  CKTOTAL 69    BNP: Invalid input(s): POCBNP  CBG: Recent Labs  Lab 06/13/24 2053  GLUCAP 107*    Microbiology: Results for orders placed or performed during the hospital encounter of 06/13/24  Gastrointestinal Panel by PCR , Stool     Status: Abnormal   Collection Time: 06/13/24 11:41 PM   Specimen: Stool  Result Value  Ref Range Status   Campylobacter species NOT DETECTED NOT DETECTED Final   Plesimonas shigelloides NOT DETECTED NOT DETECTED Final   Salmonella species NOT DETECTED NOT DETECTED Final   Yersinia enterocolitica NOT DETECTED NOT DETECTED Final   Vibrio species NOT DETECTED NOT DETECTED Final   Vibrio cholerae NOT DETECTED NOT DETECTED Final   Enteroaggregative E coli (EAEC) NOT DETECTED NOT DETECTED Final   Enteropathogenic E coli (EPEC) DETECTED (A) NOT DETECTED Final    Comment: RESULT CALLED TO, READ BACK BY AND  VERIFIED WITH:  MYRDA [facility] AT 0154 06/14/24 JG    Enterotoxigenic E coli (ETEC) NOT DETECTED NOT DETECTED Final   Shiga like toxin producing E coli (STEC) NOT DETECTED NOT DETECTED Final   Shigella/Enteroinvasive E coli (EIEC) NOT DETECTED NOT DETECTED Final   Cryptosporidium NOT DETECTED NOT DETECTED Final   Cyclospora cayetanensis NOT DETECTED NOT DETECTED Final   Entamoeba histolytica NOT DETECTED NOT DETECTED Final   Giardia lamblia NOT DETECTED NOT DETECTED Final   Adenovirus F40/41 NOT DETECTED NOT DETECTED Final   Astrovirus NOT DETECTED NOT DETECTED Final   Norovirus GI/GII NOT DETECTED NOT DETECTED Final   Rotavirus A NOT DETECTED NOT DETECTED Final   Sapovirus (I, II, IV, and V) NOT DETECTED NOT DETECTED Final    Comment: Performed at [facility], [address] [place], [place] [address]  C Difficile Quick Screen w PCR reflex     Status: Abnormal   Collection Time: 06/13/24 11:41 PM   Specimen: Stool  Result Value Ref Range Status   C Diff antigen POSITIVE (A) NEGATIVE Final   C Diff toxin NEGATIVE NEGATIVE Final   C Diff interpretation Results are indeterminate. See PCR results.  Final    Comment: Performed at [facility], [address] [place], [place] [address]  C. Diff by PCR, Reflexed     Status: None   Collection Time: 06/13/24 11:41 PM  Result Value Ref Range Status   Toxigenic C. Difficile by PCR NEGATIVE NEGATIVE Final    Comment: Patient is colonized with non toxigenic C. difficile. May not need treatment unless significant symptoms are present.   Hypervirulent Strain PRESUMPTIVE NEGATIVE PRESUMPTIVE NEGATIVE Final    Comment: Performed at [facility], [address] [place], [place] [address]    Coagulation Studies: No results for input(s): LABPROT, INR in the last 72 hours.  Urinalysis: Recent Labs    06/13/24 2149  COLORURINE AMBER*  LABSPEC 1.014  PHURINE 5.0  GLUCOSEU NEGATIVE  HGBUR SMALL*  BILIRUBINUR  NEGATIVE  KETONESUR NEGATIVE  PROTEINUR 100*  NITRITE NEGATIVE  LEUKOCYTESUR TRACE*      Imaging: CT HEAD WO CONTRAST Result Date: 06/13/2024 EXAM: CT HEAD WITHOUT CONTRAST 06/13/2024 08:44:22 PM TECHNIQUE: CT of the head was performed without the administration of intravenous contrast. Automated exposure control, iterative reconstruction, and/or weight based adjustment of the mA/kV was utilized to reduce the radiation dose to as low as reasonably achievable. COMPARISON: None available. CLINICAL HISTORY: Mental status change, unknown cause; weakness. Pt's son arrives and reports pt called him this morning c/o n/v and weakness. He reports pt normally takes care of herself and is very active. Pt is drowsy in triage and hard to answer triage questions. Pt on plavix . FINDINGS: BRAIN AND VENTRICLES: No acute hemorrhage. No evidence of acute infarct. No hydrocephalus. No extra-axial collection. No mass effect or midline shift. Mild age-related atrophy. Subcortical and periventricular small vessel ischemic changes. Chronic left thalamic lacunar infarct. Benign dural  calcifications. ORBITS: No acute abnormality. SINUSES: No acute abnormality. SOFT TISSUES AND SKULL: No acute soft tissue abnormality. No skull fracture. IMPRESSION: 1. No acute intracranial abnormality. Electronically signed by: Pinkie Pebbles MD 06/13/2024 08:56 PM EDT RP Workstation: [id number]     Medications:    cefTRIAXone  (ROCEPHIN )  IV Stopped (06/14/24 1039)   sodium bicarbonate  150 mEq in dextrose  5 % 1,150 mL infusion 50 mL/hr at 06/14/24 1217    heparin  injection (subcutaneous)  5,000 Units Subcutaneous Q8H   levothyroxine   75 mcg Oral Q0600   pantoprazole  (PROTONIX ) IV  40 mg Intravenous Q24H   vancomycin   125 mg Oral QID   acetaminophen  **OR** acetaminophen , hydrALAZINE , HYDROcodone -acetaminophen , ondansetron  **OR** ondansetron  (ZOFRAN ) IV  Assessment/ Plan:  Ms. OLUWAKEMI SALSBERRY is a 78 y.o.  female with past medical  conditions including CAD, hypertension, stroke, and chronic kidney disease stage V.  Patient presents to the emergency department complaining of abdominal pain, diarrhea and weakness.  She has been admitted for Dehydration [E86.0] AKI (acute kidney injury) [N17.9] Altered mental status, unspecified altered mental status type [R41.82] Acute metabolic encephalopathy [G93.41] Diarrhea, unspecified type [R19.7]   Acute Kidney Injury on chronic kidney disease stage V with baseline creatinine 4.27 and GFR of 10 on last office visit on 01/31/2024.  Acute kidney injury secondary to dehydration from GI losses No recent IV contrast exposure.  Creatinine on ED arrival 5.18.  Patient has been adamant during previous admissions and in office she would not be interested in renal replacement therapy.  Agree with IV hydration and continue supportive measures.  Lab Results  Component Value Date   CREATININE 5.05 (H) 06/14/2024   CREATININE 5.18 (H) 06/13/2024   CREATININE 4.21 (H) 11/14/2023    Intake/Output Summary (Last 24 hours) at 06/14/2024 1521 Last data filed at 06/14/2024 1217 Gross per 24 hour  Intake 1722.5 ml  Output 200 ml  Net 1522.5 ml   2.  Acute metabolic acidosis, serum bicarb 15 on ED arrival.  Agree with sodium bicarb infusion prescribed by primary team.  3. Anemia of chronic kidney disease Lab Results  Component Value Date   HGB 7.0 (L) 06/14/2024  Hemoglobin decreased, will defer to primary team for need of blood transfusion.  4.  Hypertension with chronic kidney disease.  Home regimen includes clonidine  and labetalol , both currently held.    LOS: 0 Asah Lamay 9/25/20253:21 PM

## 2024-06-14 NOTE — Plan of Care (Signed)
   Problem: Education: Goal: Knowledge of General Education information will improve Description Including pain rating scale, medication(s)/side effects and non-pharmacologic comfort measures Outcome: Progressing

## 2024-06-14 NOTE — Assessment & Plan Note (Addendum)
 Secondary to GI losses, baseline worsening of CKD creatinine 5.54 with a GFR of 7.  Nephrology following.  Patient has refused dialysis in the past.  Gentle IV fluid.  Palliative care consultation.

## 2024-06-14 NOTE — Assessment & Plan Note (Signed)
 No acute issues suspected

## 2024-06-14 NOTE — Assessment & Plan Note (Addendum)
 Hold aspirin  and Plavix .  Stroke risk higher holding these medications.  On atorvastatin 

## 2024-06-14 NOTE — Assessment & Plan Note (Addendum)
 Secondary to dehydration from acute gastrointestinal illness, lower suspicion for uremic encephalopathy.  Restart diet

## 2024-06-14 NOTE — ED Notes (Signed)
 Position changed and fecal tube system in place. Voices concerns when she's cold and is soiled but when attempting to hold a conversation she becomes nonverbal and returns to sleep

## 2024-06-14 NOTE — Assessment & Plan Note (Addendum)
 Enteropathogenic E coli Completed 3 days of Rocephin  for enteropathogenic E. coli.  Empirically placed on vancomycin  even though C. difficile is negative since patient is sick.  Stool starting to form up.

## 2024-06-15 DIAGNOSIS — E8721 Acute metabolic acidosis: Secondary | ICD-10-CM | POA: Diagnosis not present

## 2024-06-15 DIAGNOSIS — N179 Acute kidney failure, unspecified: Secondary | ICD-10-CM | POA: Diagnosis not present

## 2024-06-15 DIAGNOSIS — G9341 Metabolic encephalopathy: Secondary | ICD-10-CM | POA: Diagnosis not present

## 2024-06-15 DIAGNOSIS — A09 Infectious gastroenteritis and colitis, unspecified: Secondary | ICD-10-CM | POA: Diagnosis not present

## 2024-06-15 DIAGNOSIS — N3001 Acute cystitis with hematuria: Secondary | ICD-10-CM

## 2024-06-15 LAB — CBC
HCT: 22 % — ABNORMAL LOW (ref 36.0–46.0)
Hemoglobin: 7.2 g/dL — ABNORMAL LOW (ref 12.0–15.0)
MCH: 30.1 pg (ref 26.0–34.0)
MCHC: 32.7 g/dL (ref 30.0–36.0)
MCV: 92.1 fL (ref 80.0–100.0)
Platelets: 210 K/uL (ref 150–400)
RBC: 2.39 MIL/uL — ABNORMAL LOW (ref 3.87–5.11)
RDW: 13.4 % (ref 11.5–15.5)
WBC: 12.3 K/uL — ABNORMAL HIGH (ref 4.0–10.5)
nRBC: 0 % (ref 0.0–0.2)

## 2024-06-15 LAB — URINE CULTURE: Culture: >=100000 — AB

## 2024-06-15 LAB — BASIC METABOLIC PANEL WITH GFR
Anion gap: 15 (ref 5–15)
BUN: 93 mg/dL — ABNORMAL HIGH (ref 8–23)
CO2: 19 mmol/L — ABNORMAL LOW (ref 22–32)
Calcium: 7.6 mg/dL — ABNORMAL LOW (ref 8.9–10.3)
Chloride: 103 mmol/L (ref 98–111)
Creatinine, Ser: 5.06 mg/dL — ABNORMAL HIGH (ref 0.44–1.00)
GFR, Estimated: 8 mL/min — ABNORMAL LOW (ref >60)
Glucose, Bld: 128 mg/dL — ABNORMAL HIGH (ref 70–99)
Potassium: 3.2 mmol/L — ABNORMAL LOW (ref 3.5–5.1)
Sodium: 137 mmol/L (ref 135–145)

## 2024-06-15 MED ORDER — SODIUM BICARBONATE 8.4 % IV SOLN
INTRAVENOUS | Status: DC
  Filled 2024-06-15: qty 1000
  Filled 2024-06-15: qty 150

## 2024-06-15 MED ORDER — LATANOPROST 0.005 % OP SOLN
1.0000 [drp] | Freq: Every day | OPHTHALMIC | Status: DC
  Administered 2024-06-15 – 2024-06-21 (×7): 1 [drp] via OPHTHALMIC
  Filled 2024-06-15: qty 2.5

## 2024-06-15 MED ORDER — POTASSIUM CHLORIDE CRYS ER 20 MEQ PO TBCR
40.0000 meq | EXTENDED_RELEASE_TABLET | Freq: Once | ORAL | Status: AC
Start: 2024-06-15 — End: 2024-06-15
  Administered 2024-06-15: 40 meq via ORAL
  Filled 2024-06-15: qty 2

## 2024-06-15 MED ORDER — ASPIRIN 81 MG PO TBEC
81.0000 mg | DELAYED_RELEASE_TABLET | Freq: Every day | ORAL | Status: DC
  Administered 2024-06-15 – 2024-06-18 (×4): 81 mg via ORAL
  Filled 2024-06-15 (×4): qty 1

## 2024-06-15 MED ORDER — CALCITRIOL 0.25 MCG PO CAPS
0.2500 ug | ORAL_CAPSULE | Freq: Every day | ORAL | Status: DC
  Administered 2024-06-15 – 2024-06-22 (×8): 0.25 ug via ORAL
  Filled 2024-06-15 (×8): qty 1

## 2024-06-15 MED ORDER — CLONIDINE HCL 0.1 MG PO TABS
0.2000 mg | ORAL_TABLET | Freq: Two times a day (BID) | ORAL | Status: DC
Start: 2024-06-15 — End: 2024-06-22
  Administered 2024-06-15 – 2024-06-22 (×15): 0.2 mg via ORAL
  Filled 2024-06-15 (×15): qty 2

## 2024-06-15 MED ORDER — LABETALOL HCL 200 MG PO TABS
300.0000 mg | ORAL_TABLET | Freq: Two times a day (BID) | ORAL | Status: DC
  Administered 2024-06-15 – 2024-06-22 (×15): 300 mg via ORAL
  Filled 2024-06-15 (×15): qty 1

## 2024-06-15 MED ORDER — CLOPIDOGREL BISULFATE 75 MG PO TABS
75.0000 mg | ORAL_TABLET | Freq: Every day | ORAL | Status: DC
  Administered 2024-06-15 – 2024-06-18 (×4): 75 mg via ORAL
  Filled 2024-06-15 (×4): qty 1

## 2024-06-15 MED ORDER — ATORVASTATIN CALCIUM 20 MG PO TABS
40.0000 mg | ORAL_TABLET | Freq: Every evening | ORAL | Status: DC
  Administered 2024-06-15 – 2024-06-21 (×7): 40 mg via ORAL
  Filled 2024-06-15 (×7): qty 2

## 2024-06-15 NOTE — Care Management Important Message (Signed)
 Important Message  Patient Details  Name: Betty Nielsen MRN: [id number] Date of Birth: 1946-04-17   Important Message Given:  Yes - Medicare IM     Betty Nielsen 06/15/2024, 3:44 PM

## 2024-06-15 NOTE — Evaluation (Signed)
 Physical Therapy Evaluation Patient Details Name: Betty Nielsen MRN: [id number] DOB: 11/29/1945 Today's Date: 06/15/2024  History of Present Illness  presented to ER secondary to generalized weakness, nausea/vomiting, diarrhea; admitted for mangement of acute metabolic encephalopathy, acute infectious gastroenteritis.  Clinical Impression  Patient resting in bed upon arrival; much more alert and interactive than on previous date.  Oriented to all basic information; follows simple commands.  Globally HOH.  Endorses pain in buttocks/rectum (from rectal tube); limited tolerance for positioning/WBing as a result. Generally weak and deconditioned throughout all extremities, but no focal weakness appreciated.  Able to complete bed mobility with cga/close sup; sit/stand, standing balance and basic transfers with RW, min assist.  Generally weak and unsteady with mobility efforts, requiring bilat UE support, use of RW and +1 assist throughout.  Gait distance deferred due to pain/fatigue with toileting; unable to tolerate sitting position (declined OOB) due to pain in rectum. Would benefit from skilled PT to address above deficits and promote optimal return to PLOF.; recommend post-acute PT follow up as indicated by interdisciplinary care team.          If plan is discharge home, recommend the following: A little help with walking and/or transfers;A little help with bathing/dressing/bathroom   Can travel by private vehicle   Yes    Equipment Recommendations    Recommendations for Other Services       Functional Status Assessment Patient has had a recent decline in their functional status and demonstrates the ability to make significant improvements in function in a reasonable and predictable amount of time.     Precautions / Restrictions Precautions Precautions: Fall Restrictions Weight Bearing Restrictions Per Provider Order: No      Mobility  Bed Mobility Overal bed mobility: Needs  Assistance Bed Mobility: Supine to Sit, Sit to Supine     Supine to sit: Min assist Sit to supine: Min assist        Transfers Overall transfer level: Needs assistance Equipment used: Rolling walker (2 wheels) Transfers: Sit to/from Stand, Bed to chair/wheelchair/BSC Sit to Stand: Min assist Stand pivot transfers: Min assist              Ambulation/Gait               General Gait Details: declined by patient due to pain/fatigue with efforts  Stairs            Wheelchair Mobility     Tilt Bed    Modified Rankin (Stroke Patients Only)       Balance Overall balance assessment: Needs assistance Sitting-balance support: Feet supported, No upper extremity supported Sitting balance-Leahy Scale: Fair     Standing balance support: Bilateral upper extremity supported Standing balance-Leahy Scale: Fair                               Pertinent Vitals/Pain Pain Assessment Pain Assessment: Faces Faces Pain Scale: Hurts even more Pain Location: rectum Pain Descriptors / Indicators: Aching Pain Intervention(s): Limited activity within patient's tolerance, Monitored during session, Repositioned    Home Living Family/patient expects to be discharged to:: Private residence Living Arrangements: Alone   Type of Home: House Home Access: Stairs to enter   [employer] of Steps: 2   Home Layout: One level Home Equipment: Agricultural consultant (2 wheels);Cane - single point      Prior Function Prior Level of Function : Independent/Modified Independent;Driving  Mobility Comments: Mod indep with ADLs, household and community mobilization; intermittent use of SPC as needed for longer distances, uneven surfaces.  Does endorse 1-2 falls in previous six months.       Extremity/Trunk Assessment   Upper Extremity Assessment Upper Extremity Assessment: Generalized weakness (grossly at least 4-/5 throughout)    Lower Extremity  Assessment Lower Extremity Assessment: Generalized weakness (grossly at least 4-/5 throughout)       Communication   Communication Communication: Impaired Factors Affecting Communication: Hearing impaired    Cognition Arousal: Alert Behavior During Therapy: WFL for tasks assessed/performed   PT - Cognitive impairments: No apparent impairments                         Following commands: Intact       Cueing Cueing Techniques: Verbal cues, Tactile cues     General Comments      Exercises Other Exercises Other Exercises: Toilet transfer, SPT to/from BSC, min assist +1; sit/stand from standard BSC with RW, min assist +1   Assessment/Plan    PT Assessment Patient needs continued PT services  PT Problem List Decreased strength;Decreased activity tolerance;Decreased balance;Decreased mobility;Decreased knowledge of use of DME;Decreased safety awareness;Decreased knowledge of precautions;Cardiopulmonary status limiting activity       PT Treatment Interventions DME instruction;Gait training;Stair training;Functional mobility training;Therapeutic activities;Therapeutic exercise;Balance training;Cognitive remediation;Patient/family education    PT Goals (Current goals can be found in the Care Plan section)  Acute Rehab PT Goals Patient Stated Goal: to return home PT Goal Formulation: With patient Time For Goal Achievement: 06/29/24 Potential to Achieve Goals: Good    Frequency Min 2X/week     Co-evaluation               AM-PAC PT 6 Clicks Mobility  Outcome Measure Help needed turning from your back to your side while in a flat bed without using bedrails?: None Help needed moving from lying on your back to sitting on the side of a flat bed without using bedrails?: A Little Help needed moving to and from a bed to a chair (including a wheelchair)?: A Little Help needed standing up from a chair using your arms (e.g., wheelchair or bedside chair)?: A  Little Help needed to walk in hospital room?: A Little Help needed climbing 3-5 steps with a railing? : A Lot 6 Click Score: 18    End of Session   Activity Tolerance: Patient tolerated treatment well Patient left: in bed;with call bell/phone within reach;with bed alarm set Nurse Communication: Mobility status PT Visit Diagnosis: Muscle weakness (generalized) (M62.81);Difficulty in walking, not elsewhere classified (R26.2)    Time: 8997-8978 PT Time Calculation (min) (ACUTE ONLY): 19 min   Charges:   PT Evaluation $PT Eval Moderate Complexity: 1 Mod   PT General Charges $$ ACUTE PT VISIT: 1 Visit         Nicholle Falzon H. Delores, PT, DPT, NCS 06/15/24, 11:40 AM [phone]

## 2024-06-15 NOTE — Plan of Care (Signed)
   Problem: Education: Goal: Knowledge of General Education information will improve Description Including pain rating scale, medication(s)/side effects and non-pharmacologic comfort measures Outcome: Progressing

## 2024-06-15 NOTE — Progress Notes (Signed)
 Progress Note   Patient: Betty Nielsen FMW:[id number] DOB: [birth year] DOA: 06/13/2024     1 DOS: the patient was seen and examined on 06/15/2024   Brief hospital course: 78 y.o. female with medical history significant for HTN, AAA s/p endovascular repair, prior stroke, CAD surgical hypothyroidism, CKD 5 who has declined dialysis planning referred by her nephrologist, hypothyroidism, anemia of chronic disease, lumbar spinal stenosis, being admitted with acute encephalopathy  in the setting of acute diarrhea.  She was brought in by her son with generalized weakness, increased somnolence and nausea vomiting and diarrhea starting earlier in the day.  Patient is very somnolent and unable to contribute to the history. In the ED, mild BP elevation to 164/79 with otherwise normal vitals. Labs notable for worsening renal function with creatinine of 5.1 up from most recent baseline of 3-4 with bicarb of 15, BUN 79.  Potassium normal..  WBC 13.9.  Hemoglobin at baseline of 8.  Urinalysis showing trace leukocytes few bacteria, protein.  CK 69. EKG with NSR at 65 CT head nonacute Chest x-ray not done Patient was given a 500 cc NS bolus Admission requested   9/25.  Patient having a lot of diarrhea and rectal tube was placed.  Started empirically on vancomycin  even though C. difficile testing was negative.  Enteropathogenic E. coli growing out of culture.  Started on Rocephin  for 3 days.  Assessment and Plan: * Acute infective gastroenteritis Enteropathogenic E coli Rectal tube for watery diarrhea, IV fluid hydration Started on Rocephin  for enteropathogenic E. coli.  Empirically placed on vancomycin  even though C. difficile is negative since patient is sick.  Acute metabolic encephalopathy Mental status improved  Acute metabolic acidosis Secondary to worsening creatinine.  Continue IV fluid hydration with sodium bicarbonate   Acute renal failure superimposed on stage 4 chronic kidney disease  (HCC) Acute metabolic acidosis Secondary to GI losses, baseline worsening of CKD creatinine 5.06 with a GFR of 8.  As per nephrology patient has refused dialysis in the past.  Hypothyroidism, postsurgical Continue levothyroxine   Anemia of chronic disease Hemoglobin down to 7.2.  May end up needing a blood transfusion during this hospital course.  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 coli growing out of urine culture.  Rocephin  for 3 days will cover.  Overweight (BMI 25.0-29.9) BMI 27.46  Coronary artery disease Continue aspirin  and atorvastatin   History of CVA (cerebrovascular accident) Continue aspirin , Plavix  and statin        Subjective: Patient still has a rectal tube with diarrhea.  Had diarrhea for a few weeks prior to coming in.  Physical Exam: Vitals:   06/15/24 0855 06/15/24 0927 06/15/24 1043 06/15/24 1344  BP: (!) 196/80 (!) 196/80 (!) 141/64   Pulse:  71 61   Resp:    18  Temp:   98.6 F (37 C)   TempSrc:   Oral   SpO2:   98%   Weight:       Physical Exam HENT:     Head: Normocephalic.     Mouth/Throat:     Pharynx: No oropharyngeal exudate.  Eyes:     General: Lids are normal.     Conjunctiva/sclera: Conjunctivae normal.  Cardiovascular:     Rate and Rhythm: Normal rate and regular rhythm.     Heart sounds: Normal heart sounds, S1 normal and S2 normal.  Pulmonary:     Breath sounds:  No decreased breath sounds, wheezing, rhonchi or rales.  Abdominal:     Palpations: Abdomen is soft.     Tenderness: There is no abdominal tenderness.  Musculoskeletal:     Right lower leg: No swelling.     Left lower leg: No swelling.  Skin:    General: Skin is warm.     Findings: No rash.  Neurological:     Mental Status: She is alert and oriented to person, place, and time.     Data Reviewed: Creatinine 5.06, BUN 93,  potassium 3.2, CO2 19, GFR 8, white blood cell count 12.3, hemoglobin 7.2, platelet count 210  Family Communication: Spoke with son on the phone while I was in the room  Disposition: Status is: Inpatient Remains inpatient appropriate because: Continue IV fluids.  Patient has a rectal tube so we will have to have diarrhea slowed down and start to form up prior to disposition.  Planned Discharge Destination: Skilled nursing facility    Time spent: 28 minutes  Author: Charlie Patterson, MD 06/15/2024 1:58 PM  For on call review [email or web address].

## 2024-06-15 NOTE — Plan of Care (Signed)

## 2024-06-15 NOTE — Progress Notes (Signed)
 [facility]  ROUNDING NOTE   Subjective:   Betty Nielsen is a 78 year old female with past medical conditions including CAD, hypertension, stroke, and chronic kidney disease stage V.  Patient presents to the emergency department complaining of abdominal pain, diarrhea and weakness.  She has been admitted for Dehydration [E86.0] AKI (acute kidney injury) [N17.9] Altered mental status, unspecified altered mental status type [R41.82] Acute metabolic encephalopathy [G93.41] Diarrhea, unspecified type [R19.7]  Patient is known to our practice and is followed outpatient by Dr. Douglas.  Patient was last seen in office in May for routine follow-up.   Update Patient seen sitting up in bed Alert and oriented Currently eating breakfast, does complain of some nausea Continues to have weakness   Objective:  Vital signs in last 24 hours:  Temp:  [97.8 F (36.6 C)-98.6 F (37 C)] 98.6 F (37 C) (09/26 1043) Pulse Rate:  [61-74] 61 (09/26 1043) Resp:  [16-20] 18 (09/26 0700) BP: (141-196)/(57-80) 141/64 (09/26 1043) SpO2:  [96 %-99 %] 98 % (09/26 1043)  Weight change:  Filed Weights   06/14/24 0814  Weight: 70.3 kg    Intake/Output: I/O last 3 completed shifts: In: 2593.5 [P.O.:240; I.V.:1753.5; IV Piggyback:600] Out: 601 [Urine:1; Stool:600]   Intake/Output this shift:  Total I/O In: 388.6 [I.V.:288.6; IV Piggyback:100] Out: -   Physical Exam: General: Ill-appearing  Head: Normocephalic, atraumatic. Moist oral mucosal membranes  Eyes: Anicteric  Lungs:  Clear to auscultation, normal effort  Heart: Regular rate and rhythm  Abdomen:  Soft, nontender  Extremities: No peripheral edema.  Neurologic: Awake, alert, conversant  Skin: Warm,dry, no rash  Access: None    Basic Metabolic Panel: Recent Labs  Lab 06/13/24 2021 06/14/24 0458 06/15/24 0453  NA 133* 137 137  K 4.2 3.4* 3.2*  CL 104 106 103  CO2 15* 15* 19*  GLUCOSE 114* 149* 128*  BUN 79* 80*  93*  CREATININE 5.18* 5.05* 5.06*  CALCIUM  8.6* 7.8* 7.6*  MG 4.1*  --   --     Liver Function Tests: Recent Labs  Lab 06/13/24 2021 06/14/24 0458  AST 26 23  ALT 10 8  ALKPHOS 57 49  BILITOT 0.9 0.7  PROT 6.0* 5.2*  ALBUMIN 3.1* 2.7*   No results for input(s): LIPASE, AMYLASE in the last 168 hours. No results for input(s): AMMONIA in the last 168 hours.  CBC: Recent Labs  Lab 06/13/24 2021 06/14/24 0458 06/15/24 0453  WBC 13.9* 15.4* 12.3*  HGB 8.0* 7.0* 7.2*  HCT 25.6* 22.2* 22.0*  MCV 98.1 95.7 92.1  PLT 207 194 210    Cardiac Enzymes: Recent Labs  Lab 06/13/24 2021  CKTOTAL 69    BNP: Invalid input(s): POCBNP  CBG: Recent Labs  Lab 06/13/24 2053  GLUCAP 107*    Microbiology: Results for orders placed or performed during the hospital encounter of 06/13/24  Urine Culture (for pregnant, neutropenic or urologic patients or patients with an indwelling urinary catheter)     Status: Abnormal   Collection Time: 06/13/24  9:49 PM   Specimen: Urine, Catheterized  Result Value Ref Range Status   Specimen Description   Final    URINE, CATHETERIZED Performed at [facility], [address], [place], [place] [address]    Special Requests   Final    NONE Performed at [facility], [address], [place], [place] [address]    Culture >=100,000 COLONIES/mL ESCHERICHIA COLI (A)  Final   Report Status 06/15/2024 FINAL  Final   Organism ID, Bacteria ESCHERICHIA COLI (A)  Final      Susceptibility   Escherichia coli - MIC*    AMPICILLIN 4 SENSITIVE Sensitive     CEFAZOLIN  (URINE) Value in next row Sensitive      4 SENSITIVEThis is a modified FDA-approved test that has been validated and its performance characteristics determined by the reporting laboratory.  This laboratory is certified under the Clinical Laboratory Improvement Amendments CLIA as qualified to perform high complexity clinical laboratory testing.    CEFEPIME Value in  next row Sensitive      4 SENSITIVEThis is a modified FDA-approved test that has been validated and its performance characteristics determined by the reporting laboratory.  This laboratory is certified under the Clinical Laboratory Improvement Amendments CLIA as qualified to perform high complexity clinical laboratory testing.    ERTAPENEM Value in next row Sensitive      4 SENSITIVEThis is a modified FDA-approved test that has been validated and its performance characteristics determined by the reporting laboratory.  This laboratory is certified under the Clinical Laboratory Improvement Amendments CLIA as qualified to perform high complexity clinical laboratory testing.    CEFTRIAXONE  Value in next row Sensitive      4 SENSITIVEThis is a modified FDA-approved test that has been validated and its performance characteristics determined by the reporting laboratory.  This laboratory is certified under the Clinical Laboratory Improvement Amendments CLIA as qualified to perform high complexity clinical laboratory testing.    CIPROFLOXACIN Value in next row Sensitive      4 SENSITIVEThis is a modified FDA-approved test that has been validated and its performance characteristics determined by the reporting laboratory.  This laboratory is certified under the Clinical Laboratory Improvement Amendments CLIA as qualified to perform high complexity clinical laboratory testing.    GENTAMICIN Value in next row Sensitive      4 SENSITIVEThis is a modified FDA-approved test that has been validated and its performance characteristics determined by the reporting laboratory.  This laboratory is certified under the Clinical Laboratory Improvement Amendments CLIA as qualified to perform high complexity clinical laboratory testing.    NITROFURANTOIN Value in next row Sensitive      4 SENSITIVEThis is a modified FDA-approved test that has been validated and its performance characteristics determined by the reporting laboratory.   This laboratory is certified under the Clinical Laboratory Improvement Amendments CLIA as qualified to perform high complexity clinical laboratory testing.    TRIMETH/SULFA Value in next row Sensitive      4 SENSITIVEThis is a modified FDA-approved test that has been validated and its performance characteristics determined by the reporting laboratory.  This laboratory is certified under the Clinical Laboratory Improvement Amendments CLIA as qualified to perform high complexity clinical laboratory testing.    AMPICILLIN/SULBACTAM Value in next row Sensitive      4 SENSITIVEThis is a modified FDA-approved test that has been validated and its performance characteristics determined by the reporting laboratory.  This laboratory is certified under the Clinical Laboratory Improvement Amendments CLIA as qualified to perform high complexity clinical laboratory testing.    PIP/TAZO Value in next row Sensitive      <=4 SENSITIVEThis is a modified FDA-approved test that has been validated and its performance characteristics determined by the reporting laboratory.  This laboratory is certified under the Clinical Laboratory Improvement Amendments CLIA as qualified to perform high complexity clinical laboratory testing.    MEROPENEM Value in next row Sensitive      <=4 SENSITIVEThis  is a modified FDA-approved test that has been validated and its performance characteristics determined by the reporting laboratory.  This laboratory is certified under the Clinical Laboratory Improvement Amendments CLIA as qualified to perform high complexity clinical laboratory testing.    * >=100,000 COLONIES/mL ESCHERICHIA COLI  Gastrointestinal Panel by PCR , Stool     Status: Abnormal   Collection Time: 06/13/24 11:41 PM   Specimen: Stool  Result Value Ref Range Status   Campylobacter species NOT DETECTED NOT DETECTED Final   Plesimonas shigelloides NOT DETECTED NOT DETECTED Final   Salmonella species NOT DETECTED NOT DETECTED  Final   Yersinia enterocolitica NOT DETECTED NOT DETECTED Final   Vibrio species NOT DETECTED NOT DETECTED Final   Vibrio cholerae NOT DETECTED NOT DETECTED Final   Enteroaggregative E coli (EAEC) NOT DETECTED NOT DETECTED Final   Enteropathogenic E coli (EPEC) DETECTED (A) NOT DETECTED Final    Comment: RESULT CALLED TO, READ BACK BY AND VERIFIED WITH:  MYRDA [place] [facility] AT 0154 06/14/24 JG    Enterotoxigenic E coli (ETEC) NOT DETECTED NOT DETECTED Final   Shiga like toxin producing E coli (STEC) NOT DETECTED NOT DETECTED Final   Shigella/Enteroinvasive E coli (EIEC) NOT DETECTED NOT DETECTED Final   Cryptosporidium NOT DETECTED NOT DETECTED Final   Cyclospora cayetanensis NOT DETECTED NOT DETECTED Final   Entamoeba histolytica NOT DETECTED NOT DETECTED Final   Giardia lamblia NOT DETECTED NOT DETECTED Final   Adenovirus F40/41 NOT DETECTED NOT DETECTED Final   Astrovirus NOT DETECTED NOT DETECTED Final   Norovirus GI/GII NOT DETECTED NOT DETECTED Final   Rotavirus A NOT DETECTED NOT DETECTED Final   Sapovirus (I, II, IV, and V) NOT DETECTED NOT DETECTED Final    Comment: Performed at [facility], [address] [place], [place] [address]  C Difficile Quick Screen w PCR reflex     Status: Abnormal   Collection Time: 06/13/24 11:41 PM   Specimen: Stool  Result Value Ref Range Status   C Diff antigen POSITIVE (A) NEGATIVE Final   C Diff toxin NEGATIVE NEGATIVE Final   C Diff interpretation Results are indeterminate. See PCR results.  Final    Comment: Performed at [facility], [address] [place], [place] [address]  C. Diff by PCR, Reflexed     Status: None   Collection Time: 06/13/24 11:41 PM  Result Value Ref Range Status   Toxigenic C. Difficile by PCR NEGATIVE NEGATIVE Final    Comment: Patient is colonized with non toxigenic C. difficile. May not need treatment unless significant symptoms are present.   Hypervirulent Strain PRESUMPTIVE NEGATIVE PRESUMPTIVE  NEGATIVE Final    Comment: Performed at [facility], [address] [place], [place] [address]    Coagulation Studies: No results for input(s): LABPROT, INR in the last 72 hours.  Urinalysis: Recent Labs    06/13/24 2149  COLORURINE AMBER*  LABSPEC 1.014  PHURINE 5.0  GLUCOSEU NEGATIVE  HGBUR SMALL*  BILIRUBINUR NEGATIVE  KETONESUR NEGATIVE  PROTEINUR 100*  NITRITE NEGATIVE  LEUKOCYTESUR TRACE*      Imaging: CT HEAD WO CONTRAST Result Date: 06/13/2024 EXAM: CT HEAD WITHOUT CONTRAST 06/13/2024 08:44:22 PM TECHNIQUE: CT of the head was performed without the administration of intravenous contrast. Automated exposure control, iterative reconstruction, and/or weight based adjustment of the mA/kV was utilized to reduce the radiation dose to as low as reasonably achievable. COMPARISON: None available. CLINICAL HISTORY: Mental status change, unknown cause; weakness. Pt's son arrives and reports pt  called him this morning c/o n/v and weakness. He reports pt normally takes care of herself and is very active. Pt is drowsy in triage and hard to answer triage questions. Pt on plavix . FINDINGS: BRAIN AND VENTRICLES: No acute hemorrhage. No evidence of acute infarct. No hydrocephalus. No extra-axial collection. No mass effect or midline shift. Mild age-related atrophy. Subcortical and periventricular small vessel ischemic changes. Chronic left thalamic lacunar infarct. Benign dural calcifications. ORBITS: No acute abnormality. SINUSES: No acute abnormality. SOFT TISSUES AND SKULL: No acute soft tissue abnormality. No skull fracture. IMPRESSION: 1. No acute intracranial abnormality. Electronically signed by: Pinkie Pebbles MD 06/13/2024 08:56 PM EDT RP Workstation: [id number]     Medications:    cefTRIAXone  (ROCEPHIN )  IV Stopped (06/15/24 0934)   sodium bicarbonate  150 mEq in dextrose  5 % 1,150 mL infusion 75 mL/hr at 06/15/24 1052    aspirin  EC  81 mg Oral Daily   cloNIDine    0.2 mg Oral BID   clopidogrel   75 mg Oral Daily   heparin  injection (subcutaneous)  5,000 Units Subcutaneous Q8H   labetalol   300 mg Oral BID   latanoprost   1 drop Both Eyes QHS   levothyroxine   75 mcg Oral Q0600   pantoprazole  (PROTONIX ) IV  40 mg Intravenous Q24H   vancomycin   125 mg Oral QID   acetaminophen  **OR** acetaminophen , hydrALAZINE , HYDROcodone -acetaminophen , ondansetron  **OR** ondansetron  (ZOFRAN ) IV  Assessment/ Plan:  [name] is a 78 y.o.  female with past medical conditions including CAD, hypertension, stroke, and chronic kidney disease stage V.  Patient presents to the emergency department complaining of abdominal pain, diarrhea and weakness.  She has been admitted for Dehydration [E86.0] AKI (acute kidney injury) [N17.9] Altered mental status, unspecified altered mental status type [R41.82] Acute metabolic encephalopathy [G93.41] Diarrhea, unspecified type [R19.7]   Acute Kidney Injury on chronic kidney disease stage V with baseline creatinine 4.27 and GFR of 10 on last office visit on 01/31/2024.  Acute kidney injury secondary to dehydration from GI losses No recent IV contrast exposure.  Creatinine on ED arrival 5.18.  Patient has been adamant during previous admissions and in office she would not be interested in renal replacement therapy.    Creatinine remains elevated. Oral intake decreased due to nausea. Continue IV hydration and supportive measures.   Lab Results  Component Value Date   CREATININE 5.06 (H) 06/15/2024   CREATININE 5.05 (H) 06/14/2024   CREATININE 5.18 (H) 06/13/2024    Intake/Output Summary (Last 24 hours) at 06/15/2024 1241 Last data filed at 06/15/2024 1052 Gross per 24 hour  Intake 1259.54 ml  Output 401 ml  Net 858.54 ml   2.  Acute metabolic acidosis, serum bicarb 15 on ED arrival.  S bicarb improving. Continue sodium bicarb infusion prescribed by primary team.  3. Anemia of chronic kidney disease Lab Results  Component  Value Date   HGB 7.2 (L) 06/15/2024  Hemoglobin remains decreased, can consider weekly ESA.   4.  Hypertension with chronic kidney disease.  Home regimen includes clonidine  and labetalol , both have been restarted.     LOS: 1 Lukasz Rogus 9/26/202512:41 PM

## 2024-06-15 NOTE — Assessment & Plan Note (Addendum)
 E. coli growing out of urine culture.  Completed Rocephin  for 3 days.

## 2024-06-16 DIAGNOSIS — E8721 Acute metabolic acidosis: Secondary | ICD-10-CM | POA: Diagnosis not present

## 2024-06-16 DIAGNOSIS — A09 Infectious gastroenteritis and colitis, unspecified: Secondary | ICD-10-CM | POA: Diagnosis not present

## 2024-06-16 DIAGNOSIS — G9341 Metabolic encephalopathy: Secondary | ICD-10-CM | POA: Diagnosis not present

## 2024-06-16 DIAGNOSIS — N179 Acute kidney failure, unspecified: Secondary | ICD-10-CM | POA: Diagnosis not present

## 2024-06-16 LAB — BASIC METABOLIC PANEL WITH GFR
Anion gap: 10 (ref 5–15)
BUN: 89 mg/dL — ABNORMAL HIGH (ref 8–23)
CO2: 23 mmol/L (ref 22–32)
Calcium: 7.7 mg/dL — ABNORMAL LOW (ref 8.9–10.3)
Chloride: 103 mmol/L (ref 98–111)
Creatinine, Ser: 5.26 mg/dL — ABNORMAL HIGH (ref 0.44–1.00)
GFR, Estimated: 8 mL/min — ABNORMAL LOW (ref >60)
Glucose, Bld: 91 mg/dL (ref 70–99)
Potassium: 3.4 mmol/L — ABNORMAL LOW (ref 3.5–5.1)
Sodium: 136 mmol/L (ref 135–145)

## 2024-06-16 LAB — CBC
HCT: 19 % — ABNORMAL LOW (ref 36.0–46.0)
Hemoglobin: 6.3 g/dL — ABNORMAL LOW (ref 12.0–15.0)
MCH: 30.3 pg (ref 26.0–34.0)
MCHC: 33.2 g/dL (ref 30.0–36.0)
MCV: 91.3 fL (ref 80.0–100.0)
Platelets: 210 K/uL (ref 150–400)
RBC: 2.08 MIL/uL — ABNORMAL LOW (ref 3.87–5.11)
RDW: 13.7 % (ref 11.5–15.5)
WBC: 11.6 K/uL — ABNORMAL HIGH (ref 4.0–10.5)
nRBC: 0 % (ref 0.0–0.2)

## 2024-06-16 LAB — PREPARE RBC (CROSSMATCH)

## 2024-06-16 MED ORDER — SODIUM CHLORIDE 0.9% IV SOLUTION
Freq: Once | INTRAVENOUS | Status: AC
Start: 2024-06-16 — End: 2024-06-16

## 2024-06-16 MED ORDER — POTASSIUM CHLORIDE 20 MEQ PO PACK
40.0000 meq | PACK | Freq: Once | ORAL | Status: AC
  Administered 2024-06-16: 40 meq via ORAL
  Filled 2024-06-16: qty 2

## 2024-06-16 NOTE — Plan of Care (Signed)
   Problem: Activity: Goal: Risk for activity intolerance will decrease Outcome: Progressing   Problem: Nutrition: Goal: Adequate nutrition will be maintained Outcome: Progressing   Problem: Safety: Goal: Ability to remain free from injury will improve Outcome: Progressing

## 2024-06-16 NOTE — Progress Notes (Signed)
 [facility]  PROGRESS NOTE   Subjective:   Patient seen at bedside.  Comfortable today.  Objective:  Vital signs: Blood pressure (!) 123/56, pulse (!) 58, temperature 97.8 F (36.6 C), temperature source Oral, resp. rate 18, weight 70.3 kg, SpO2 97%.  Intake/Output Summary (Last 24 hours) at 06/16/2024 1436 Last data filed at 06/16/2024 0200 Gross per 24 hour  Intake 897.96 ml  Output 400 ml  Net 497.96 ml   Filed Weights   06/14/24 0814  Weight: 70.3 kg     Physical Exam: General:  No acute distress  Head:  Normocephalic, atraumatic. Moist oral mucosal membranes  Eyes:  Anicteric  Neck:  Supple  Lungs:   Clear to auscultation, normal effort  Heart:  S1S2 no rubs  Abdomen:   Soft, nontender, bowel sounds present  Extremities:  peripheral edema.  Neurologic:  Awake, alert, following commands  Skin:  No lesions  Access:     Basic Metabolic Panel: Recent Labs  Lab 06/13/24 2021 06/14/24 0458 06/15/24 0453 06/16/24 0350  NA 133* 137 137 136  K 4.2 3.4* 3.2* 3.4*  CL 104 106 103 103  CO2 15* 15* 19* 23  GLUCOSE 114* 149* 128* 91  BUN 79* 80* 93* 89*  CREATININE 5.18* 5.05* 5.06* 5.26*  CALCIUM  8.6* 7.8* 7.6* 7.7*  MG 4.1*  --   --   --    GFR: Estimated Creatinine Clearance: 8.3 mL/min (A) (by C-G formula based on SCr of 5.26 mg/dL (H)).  Liver Function Tests: Recent Labs  Lab 06/13/24 2021 06/14/24 0458  AST 26 23  ALT 10 8  ALKPHOS 57 49  BILITOT 0.9 0.7  PROT 6.0* 5.2*  ALBUMIN 3.1* 2.7*   No results for input(s): LIPASE, AMYLASE in the last 168 hours. No results for input(s): AMMONIA in the last 168 hours.  CBC: Recent Labs  Lab 06/13/24 2021 06/14/24 0458 06/15/24 0453 06/16/24 0350  WBC 13.9* 15.4* 12.3* 11.6*  HGB 8.0* 7.0* 7.2* 6.3*  HCT 25.6* 22.2* 22.0* 19.0*  MCV 98.1 95.7 92.1 91.3  PLT 207 194 210 210     HbA1C: Hgb A1c MFr Bld  Date/Time Value Ref Range Status  07/06/2022 02:52 PM 5.5 <5.7 % of  total Hgb Final    Comment:    For the purpose of screening for the presence of diabetes: . <5.7%       Consistent with the absence of diabetes 5.7-6.4%    Consistent with increased risk for diabetes             (prediabetes) > or =6.5%  Consistent with diabetes . This assay result is consistent with a decreased risk of diabetes. . Currently, no consensus exists regarding use of hemoglobin A1c for diagnosis of diabetes in children. . According to American Diabetes Association (ADA) guidelines, hemoglobin A1c <7.0% represents optimal control in non-pregnant diabetic patients. Different metrics may apply to specific patient populations.  Standards of Medical Care in Diabetes(ADA). SABRA   06/07/2021 07:40 AM 5.7 (H) 4.8 - 5.6 % Final    Comment:    (NOTE) Pre diabetes:          5.7%-6.4%  Diabetes:              >6.4%  Glycemic control for   <7.0% adults with diabetes     Urinalysis: Recent Labs    06/13/24 2149  COLORURINE AMBER*  LABSPEC 1.014  PHURINE 5.0  GLUCOSEU NEGATIVE  HGBUR SMALL*  BILIRUBINUR NEGATIVE  KETONESUR NEGATIVE  PROTEINUR 100*  NITRITE NEGATIVE  LEUKOCYTESUR TRACE*      Imaging: No results found.   Medications:     sodium chloride    Intravenous Once   aspirin  EC  81 mg Oral Daily   atorvastatin   40 mg Oral QPM   calcitRIOL   0.25 mcg Oral Daily   cloNIDine   0.2 mg Oral BID   clopidogrel   75 mg Oral Daily   heparin  injection (subcutaneous)  5,000 Units Subcutaneous Q8H   labetalol   300 mg Oral BID   latanoprost   1 drop Both Eyes QHS   levothyroxine   75 mcg Oral Q0600   pantoprazole  (PROTONIX ) IV  40 mg Intravenous Q24H   vancomycin   125 mg Oral QID    Assessment/ Plan:     78 year old female with past medical conditions including CAD, hypertension, stroke, and chronic kidney disease stage V.  Patient presents to the emergency department complaining of abdominal pain, diarrhea and weakness.  She has been admitted for Dehydration  [E86.0], AKI (acute kidney injury) [N17.9]  #1: Acute kidney injury: Renal indices have not improved.  Patient continued to have diarrhea.  Will continue IV fluids as ordered.  Patient has been declining any intervention at this time.  #2: Anemia: For blood transfusion today.  #3: Hypokalemia: Supplement potassium today with 40 mEq.  #4: Metabolic acidosis: Now improving.  #5: Hypertension: Will continue clonidine  and labetalol .  #6: Gastroenteritis: Patient has been on oral vancomycin .  Labs and medications reviewed. Will continue to follow along with you.   LOS: 2 Pinkey Edman, MD [facility] 9/27/20252:36 PM

## 2024-06-16 NOTE — Assessment & Plan Note (Signed)
 On clonidine  and labetalol 

## 2024-06-16 NOTE — Plan of Care (Signed)

## 2024-06-16 NOTE — Progress Notes (Signed)
 Progress Note   Patient: Betty Nielsen FMW:[id number] DOB: 1946/06/10 DOA: 06/13/2024     2 DOS: the patient was seen and examined on 06/16/2024   Brief hospital course: 78 y.o. female with medical history significant for HTN, AAA s/p endovascular repair, prior stroke, CAD surgical hypothyroidism, CKD 5 who has declined dialysis planning referred by her nephrologist, hypothyroidism, anemia of chronic disease, lumbar spinal stenosis, being admitted with acute encephalopathy  in the setting of acute diarrhea.  She was brought in by her son with generalized weakness, increased somnolence and nausea vomiting and diarrhea starting earlier in the day.  Patient is very somnolent and unable to contribute to the history. In the ED, mild BP elevation to 164/79 with otherwise normal vitals. Labs notable for worsening renal function with creatinine of 5.1 up from most recent baseline of 3-4 with bicarb of 15, BUN 79.  Potassium normal..  WBC 13.9.  Hemoglobin at baseline of 8.  Urinalysis showing trace leukocytes few bacteria, protein.  CK 69. EKG with NSR at 65 CT head nonacute Chest x-ray not done Patient was given a 500 cc NS bolus Admission requested   9/25.  Patient having a lot of diarrhea and rectal tube was placed.  Started empirically on vancomycin  even though C. difficile testing was negative.  Enteropathogenic E. coli growing out of culture.  Started on Rocephin  for 3 days. 9/26.  Patient declined blood transfusion.  Had rectal tube in on the morning but came out in the afternoon.  Physical therapy recommending rehab 9/27.  Hemoglobin down to 6.3.  Agreeable to blood transfusion.  Creatinine has not improved.  Has declined dialysis.  Assessment and Plan: * Acute infective gastroenteritis Enteropathogenic E coli Rectal tube for watery diarrhea, IV fluid hydration Started on Rocephin  for enteropathogenic E. coli.  Empirically placed on vancomycin  even though C. difficile is negative since patient  is sick.  Acute metabolic encephalopathy Mental status improved  Acute metabolic acidosis Secondary to worsening creatinine.  Acidosis improved with hydration.  Acute renal failure superimposed on stage 4 chronic kidney disease (HCC) Secondary to GI losses, baseline worsening of CKD creatinine 5.26 with a GFR of 8.  As per nephrology patient has refused dialysis in the past.  Will stop IV fluids.  Hypothyroidism, postsurgical Continue levothyroxine   Anemia of chronic disease Hemoglobin 6.3.  Agreeable to transfusion of 1 unit of packed red blood cells today.  Acute cystitis with hematuria E. coli growing out of urine culture.  Rocephin  for 3 days will cover.  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  Since creatinine has not improved with IV fluid hydration we will hold off on further IV fluids  Physical Exam: Vitals:   06/15/24 2343 06/16/24 0454 06/16/24 0807 06/16/24 1204  BP: (!) 171/81 (!) 159/65 (!) 157/65 (!) 123/56  Pulse: 72 60 66 (!) 58  Resp: 17 18    Temp: 98.4 F (36.9 C) 98.6 F (37 C) 99.4 F (37.4 C) 97.8 F (36.6 C)  TempSrc:   Oral Oral  SpO2: 96% 99% 98% 97%  Weight:       Physical Exam HENT:     Head: Normocephalic.  Mouth/Throat:     Pharynx: No oropharyngeal exudate.  Eyes:     General: Lids are normal.     Conjunctiva/sclera: Conjunctivae normal.  Cardiovascular:     Rate and Rhythm: Normal rate and regular rhythm.     Heart sounds: Normal heart sounds, S1 normal and S2 normal.  Pulmonary:     Breath sounds: No decreased breath sounds, wheezing,  rhonchi or rales.  Abdominal:     Palpations: Abdomen is soft.     Tenderness: There is no abdominal tenderness.  Musculoskeletal:     Right lower leg: No swelling.     Left lower leg: No swelling.  Skin:    General: Skin is warm.     Findings: No rash.  Neurological:     Mental Status: She is alert and oriented to person, place, and time.     Data Reviewed: Creatinine 5.26 with a GFR of 8, potassium 3.4, white blood cell count 11.6, hemoglobin 6.3, platelet count 210  Family Communication: Spoke with son on the phone  Disposition: Status is: Inpatient Remains inpatient appropriate because: Transfer 1 unit of packed red blood cells today.  Continue to monitor for diarrhea.  Planned Discharge Destination: Rehab    Time spent: 28 minutes  Author: Charlie Patterson, MD 06/16/2024 1:05 PM  For on call review [email or web address].

## 2024-06-17 DIAGNOSIS — G9341 Metabolic encephalopathy: Secondary | ICD-10-CM | POA: Diagnosis not present

## 2024-06-17 DIAGNOSIS — A09 Infectious gastroenteritis and colitis, unspecified: Secondary | ICD-10-CM | POA: Diagnosis not present

## 2024-06-17 DIAGNOSIS — I10 Essential (primary) hypertension: Secondary | ICD-10-CM

## 2024-06-17 DIAGNOSIS — E8721 Acute metabolic acidosis: Secondary | ICD-10-CM | POA: Diagnosis not present

## 2024-06-17 DIAGNOSIS — N179 Acute kidney failure, unspecified: Secondary | ICD-10-CM | POA: Diagnosis not present

## 2024-06-17 LAB — BASIC METABOLIC PANEL WITH GFR
Anion gap: 9 (ref 5–15)
Anion gap: 17 — ABNORMAL HIGH (ref 5–15)
BUN: 89 mg/dL — ABNORMAL HIGH (ref 8–23)
BUN: 90 mg/dL — ABNORMAL HIGH (ref 8–23)
CO2: 22 mmol/L (ref 22–32)
CO2: 20 mmol/L — ABNORMAL LOW (ref 22–32)
Calcium: 7.5 mg/dL — ABNORMAL LOW (ref 8.9–10.3)
Calcium: 7.9 mg/dL — ABNORMAL LOW (ref 8.9–10.3)
Chloride: 104 mmol/L (ref 98–111)
Chloride: 101 mmol/L (ref 98–111)
Creatinine, Ser: 5.66 mg/dL — ABNORMAL HIGH (ref 0.44–1.00)
Creatinine, Ser: 5.5 mg/dL — ABNORMAL HIGH (ref 0.44–1.00)
GFR, Estimated: 7 mL/min — ABNORMAL LOW (ref >60)
GFR, Estimated: 7 mL/min — ABNORMAL LOW (ref >60)
Glucose, Bld: 82 mg/dL (ref 70–99)
Glucose, Bld: 103 mg/dL — ABNORMAL HIGH (ref 70–99)
Potassium: 3.7 mmol/L (ref 3.5–5.1)
Potassium: 3.8 mmol/L (ref 3.5–5.1)
Sodium: 135 mmol/L (ref 135–145)
Sodium: 138 mmol/L (ref 135–145)

## 2024-06-17 LAB — CBC
HCT: 21.9 % — ABNORMAL LOW (ref 36.0–46.0)
HCT: 21.3 % — ABNORMAL LOW (ref 36.0–46.0)
Hemoglobin: 7.2 g/dL — ABNORMAL LOW (ref 12.0–15.0)
Hemoglobin: 7.2 g/dL — ABNORMAL LOW (ref 12.0–15.0)
MCH: 29.9 pg (ref 26.0–34.0)
MCH: 30.6 pg (ref 26.0–34.0)
MCHC: 32.9 g/dL (ref 30.0–36.0)
MCHC: 33.8 g/dL (ref 30.0–36.0)
MCV: 90.9 fL (ref 80.0–100.0)
MCV: 90.6 fL (ref 80.0–100.0)
Platelets: 214 K/uL (ref 150–400)
Platelets: 199 K/uL (ref 150–400)
RBC: 2.41 MIL/uL — ABNORMAL LOW (ref 3.87–5.11)
RBC: 2.35 MIL/uL — ABNORMAL LOW (ref 3.87–5.11)
RDW: 14.7 % (ref 11.5–15.5)
RDW: 14.7 % (ref 11.5–15.5)
WBC: 13 K/uL — ABNORMAL HIGH (ref 4.0–10.5)
WBC: 11.8 K/uL — ABNORMAL HIGH (ref 4.0–10.5)
nRBC: 0 % (ref 0.0–0.2)
nRBC: 0 % (ref 0.0–0.2)

## 2024-06-17 LAB — PREPARE RBC (CROSSMATCH)

## 2024-06-17 MED ORDER — HYDROCORTISONE (PERIANAL) 2.5 % EX CREA
TOPICAL_CREAM | Freq: Three times a day (TID) | CUTANEOUS | Status: DC
  Administered 2024-06-19 – 2024-06-21 (×3): 1 via RECTAL
  Filled 2024-06-17: qty 28.35

## 2024-06-17 MED ORDER — SIMETHICONE 80 MG PO CHEW
80.0000 mg | CHEWABLE_TABLET | Freq: Four times a day (QID) | ORAL | Status: DC | PRN
  Administered 2024-06-17: 80 mg via ORAL
  Filled 2024-06-17: qty 1

## 2024-06-17 MED ORDER — SODIUM CHLORIDE 0.9% IV SOLUTION
Freq: Once | INTRAVENOUS | Status: AC
Start: 2024-06-17 — End: 2024-06-18

## 2024-06-17 NOTE — Plan of Care (Signed)

## 2024-06-17 NOTE — Progress Notes (Signed)
 Progress Note   Patient: Betty Nielsen FMW:[id number] DOB: Nov 23, 1945 DOA: 06/13/2024     3 DOS: the patient was seen and examined on 06/17/2024   Brief hospital course: 78 y.o. female with medical history significant for HTN, AAA s/p endovascular repair, prior stroke, CAD surgical hypothyroidism, CKD 5 who has declined dialysis planning referred by her nephrologist, hypothyroidism, anemia of chronic disease, lumbar spinal stenosis, being admitted with acute encephalopathy  in the setting of acute diarrhea.  She was brought in by her son with generalized weakness, increased somnolence and nausea vomiting and diarrhea starting earlier in the day.  Patient is very somnolent and unable to contribute to the history. In the ED, mild BP elevation to 164/79 with otherwise normal vitals. Labs notable for worsening renal function with creatinine of 5.1 up from most recent baseline of 3-4 with bicarb of 15, BUN 79.  Potassium normal..  WBC 13.9.  Hemoglobin at baseline of 8.  Urinalysis showing trace leukocytes few bacteria, protein.  CK 69. EKG with NSR at 65 CT head nonacute Chest x-ray not done Patient was given a 500 cc NS bolus Admission requested   9/25.  Patient having a lot of diarrhea and rectal tube was placed.  Started empirically on vancomycin  even though C. difficile testing was negative.  Enteropathogenic E. coli growing out of culture.  Started on Rocephin  for 3 days. 9/26.  Patient declined blood transfusion.  Had rectal tube in on the morning but came out in the afternoon.  Physical therapy recommending rehab 9/27.  Hemoglobin down to 6.3.  Agreeable to blood transfusion.  Creatinine has not improved.  Has declined dialysis.  Assessment and Plan: * Acute infective gastroenteritis Enteropathogenic E coli Completed 3 days of Rocephin  for enteropathogenic E. coli.  Empirically placed on vancomycin  even though C. difficile is negative since patient is sick.  Stool starting to form  up.  Acute renal failure superimposed on stage 4 chronic kidney disease (HCC) Secondary to GI losses, baseline worsening of CKD creatinine 5.5 with a GFR of 7.  Nephrology following.  Patient has refused dialysis in the past.  Acute metabolic encephalopathy Mental status improved  Acute metabolic acidosis Secondary to worsening creatinine.    Hypothyroidism, postsurgical Continue levothyroxine   Anemia of chronic disease Patient transfused 1 unit of packed red blood cells on 927.  Will give another unit of packed red blood cells on 9/28.  Last hemoglobin 7.2.  Acute cystitis with hematuria E. coli growing out of urine culture.  Rocephin  for 3 days will cover.  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  Diarrhea is starting to slow down.  Not having any abdominal pain.  Eating some.  Kidney function still impaired.  Physical Exam: Vitals:   06/17/24 0431 06/17/24 0921 06/17/24 1133 06/17/24 1155  BP: (!) 129/53 (!) 174/62 (!) 126/51 (!) 118/53  Pulse: 64 64 (!) 59 (!) 58  Resp: 18 (!) 21    Temp: 98.4 F (36.9 C) 98.4 F (36.9 C) 98 F (36.7 C) 98.1 F (36.7 C)  TempSrc:   Oral Oral  SpO2: 98% 98% 100% 99%  Weight:       Physical  Exam HENT:     Head: Normocephalic.     Mouth/Throat:     Pharynx: No oropharyngeal exudate.  Eyes:     General: Lids are normal.     Conjunctiva/sclera: Conjunctivae normal.  Cardiovascular:     Rate and Rhythm: Normal rate and regular rhythm.     Heart sounds: Normal heart sounds, S1 normal and S2 normal.  Pulmonary:     Breath sounds: No decreased breath  sounds, wheezing, rhonchi or rales.  Abdominal:     Palpations: Abdomen is soft.     Tenderness: There is no abdominal tenderness.  Musculoskeletal:     Right lower leg: Swelling present.     Left lower leg: Swelling present.  Skin:    General: Skin is warm.     Findings: No rash.  Neurological:     Mental Status: She is alert and oriented to person, place, and time.     Data Reviewed: Last creatinine 5.5 with a GFR of 7, CO2 20, white blood cell count 11.8, hemoglobin 7.2, platelet count 199  Family Communication: Spoke with son on the phone  Disposition: Status is: Inpatient Remains inpatient appropriate because: Transfuse another unit of packed red blood cells today  Planned Discharge Destination: Skilled nursing facility    Time spent: 28 minutes  Author: Charlie Patterson, MD 06/17/2024 1:38 PM  For on call review [email or web address].

## 2024-06-17 NOTE — Progress Notes (Signed)
 [facility]  PROGRESS NOTE   Subjective:   Patient seen at bedside.  Denies any chest pain or shortness of breath today.  Continues to have diarrhea.  Objective:  Vital signs: Blood pressure 128/62, pulse 60, temperature 97.9 F (36.6 C), temperature source Oral, resp. rate (!) 21, weight 70.3 kg, SpO2 99%.  Intake/Output Summary (Last 24 hours) at 06/17/2024 1510 Last data filed at 06/17/2024 1455 Gross per 24 hour  Intake 644.5 ml  Output --  Net 644.5 ml   Filed Weights   06/14/24 0814  Weight: 70.3 kg     Physical Exam: General:  No acute distress  Head:  Normocephalic, atraumatic. Moist oral mucosal membranes  Eyes:  Anicteric  Neck:  Supple  Lungs:   Clear to auscultation, normal effort  Heart:  S1S2 no rubs  Abdomen:   Soft, nontender, bowel sounds present  Extremities:  peripheral edema.  Neurologic:  Awake, alert, following commands  Skin:  No lesions  Access:     Basic Metabolic Panel: Recent Labs  Lab 06/13/24 2021 06/14/24 0458 06/15/24 0453 06/16/24 0350 06/17/24 0444 06/17/24 1030  NA 133* 137 137 136 135 138  K 4.2 3.4* 3.2* 3.4* 3.7 3.8  CL 104 106 103 103 104 101  CO2 15* 15* 19* 23 22 20*  GLUCOSE 114* 149* 128* 91 82 103*  BUN 79* 80* 93* 89* 89* 90*  CREATININE 5.18* 5.05* 5.06* 5.26* 5.66* 5.50*  CALCIUM  8.6* 7.8* 7.6* 7.7* 7.5* 7.9*  MG 4.1*  --   --   --   --   --    GFR: Estimated Creatinine Clearance: 7.9 mL/min (A) (by C-G formula based on SCr of 5.5 mg/dL (H)).  Liver Function Tests: Recent Labs  Lab 06/13/24 2021 06/14/24 0458  AST 26 23  ALT 10 8  ALKPHOS 57 49  BILITOT 0.9 0.7  PROT 6.0* 5.2*  ALBUMIN 3.1* 2.7*   No results for input(s): LIPASE, AMYLASE in the last 168 hours. No results for input(s): AMMONIA in the last 168 hours.  CBC: Recent Labs  Lab 06/14/24 0458 06/15/24 0453 06/16/24 0350 06/17/24 0444 06/17/24 1030  WBC 15.4* 12.3* 11.6* 13.0* 11.8*  HGB 7.0* 7.2* 6.3* 7.2* 7.2*   HCT 22.2* 22.0* 19.0* 21.9* 21.3*  MCV 95.7 92.1 91.3 90.9 90.6  PLT 194 210 210 214 199     HbA1C: Hgb A1c MFr Bld  Date/Time Value Ref Range Status  07/06/2022 02:52 PM 5.5 <5.7 % of total Hgb Final    Comment:    For the purpose of screening for the presence of diabetes: . <5.7%       Consistent with the absence of diabetes 5.7-6.4%    Consistent with increased risk for diabetes             (prediabetes) > or =6.5%  Consistent with diabetes . This assay result is consistent with a decreased risk of diabetes. . Currently, no consensus exists regarding use of hemoglobin A1c for diagnosis of diabetes in children. . According to American Diabetes Association (ADA) guidelines, hemoglobin A1c <7.0% represents optimal control in non-pregnant diabetic patients. Different metrics may apply to specific patient populations.  Standards of Medical Care in Diabetes(ADA). SABRA   06/07/2021 07:40 AM 5.7 (H) 4.8 - 5.6 % Final    Comment:    (NOTE) Pre diabetes:          5.7%-6.4%  Diabetes:              >  6.4%  Glycemic control for   <7.0% adults with diabetes     Urinalysis: No results for input(s): COLORURINE, LABSPEC, PHURINE, GLUCOSEU, HGBUR, BILIRUBINUR, KETONESUR, PROTEINUR, UROBILINOGEN, NITRITE, LEUKOCYTESUR in the last 72 hours.  Invalid input(s): APPERANCEUR    Imaging: No results found.   Medications:     aspirin  EC  81 mg Oral Daily   atorvastatin   40 mg Oral QPM   calcitRIOL   0.25 mcg Oral Daily   cloNIDine   0.2 mg Oral BID   clopidogrel   75 mg Oral Daily   heparin  injection (subcutaneous)  5,000 Units Subcutaneous Q8H   hydrocortisone   Rectal TID   labetalol   300 mg Oral BID   latanoprost   1 drop Both Eyes QHS   levothyroxine   75 mcg Oral Q0600   pantoprazole  (PROTONIX ) IV  40 mg Intravenous Q24H   vancomycin   125 mg Oral QID    Assessment/ Plan:     78 year old female with past medical conditions including CAD,  hypertension, stroke, and chronic kidney disease stage V.  Patient presents to the emergency department complaining of abdominal pain, diarrhea and weakness.  She has been admitted for Dehydration [E86.0], AKI (acute kidney injury) [N17.9]   #1: Acute kidney injury: Renal indices has been stable. Patient continued to have diarrhea.  Will continue IV fluids as ordered.  Patient has been declining any intervention at this time.   #2: Anemia: For blood transfusion today.   #3: Hypokalemia: Supplement potassium today with 40 mEq.   #4: Metabolic acidosis: Now improving.   #5: Hypertension: Will continue clonidine  and labetalol .   #6: Gastroenteritis: Patient has been on oral vancomycin .    Labs and medications reviewed. Will continue to follow along with you.   LOS: 3 Pinkey Edman, MD [facility] 9/28/20253:10 PM

## 2024-06-18 DIAGNOSIS — N179 Acute kidney failure, unspecified: Secondary | ICD-10-CM | POA: Diagnosis not present

## 2024-06-18 DIAGNOSIS — A09 Infectious gastroenteritis and colitis, unspecified: Secondary | ICD-10-CM | POA: Diagnosis not present

## 2024-06-18 DIAGNOSIS — E8721 Acute metabolic acidosis: Secondary | ICD-10-CM | POA: Diagnosis not present

## 2024-06-18 DIAGNOSIS — G9341 Metabolic encephalopathy: Secondary | ICD-10-CM | POA: Diagnosis not present

## 2024-06-18 LAB — CBC
HCT: 24.4 % — ABNORMAL LOW (ref 36.0–46.0)
Hemoglobin: 8.2 g/dL — ABNORMAL LOW (ref 12.0–15.0)
MCH: 29.9 pg (ref 26.0–34.0)
MCHC: 33.6 g/dL (ref 30.0–36.0)
MCV: 89.1 fL (ref 80.0–100.0)
Platelets: 230 K/uL (ref 150–400)
RBC: 2.74 MIL/uL — ABNORMAL LOW (ref 3.87–5.11)
RDW: 15.4 % (ref 11.5–15.5)
WBC: 11.1 K/uL — ABNORMAL HIGH (ref 4.0–10.5)
nRBC: 0 % (ref 0.0–0.2)

## 2024-06-18 LAB — BASIC METABOLIC PANEL WITH GFR
Anion gap: 13 (ref 5–15)
BUN: 89 mg/dL — ABNORMAL HIGH (ref 8–23)
CO2: 21 mmol/L — ABNORMAL LOW (ref 22–32)
Calcium: 8 mg/dL — ABNORMAL LOW (ref 8.9–10.3)
Chloride: 103 mmol/L (ref 98–111)
Creatinine, Ser: 5.54 mg/dL — ABNORMAL HIGH (ref 0.44–1.00)
GFR, Estimated: 7 mL/min — ABNORMAL LOW (ref >60)
Glucose, Bld: 64 mg/dL — ABNORMAL LOW (ref 70–99)
Potassium: 3.7 mmol/L (ref 3.5–5.1)
Sodium: 137 mmol/L (ref 135–145)

## 2024-06-18 LAB — TYPE AND SCREEN
ABO/RH(D): AB POS
Antibody Screen: NEGATIVE
Unit division: 0
Unit division: 0

## 2024-06-18 LAB — BPAM RBC
Blood Product Expiration Date: 202510252359
Blood Product Expiration Date: 202510252359
ISSUE DATE / TIME: 202509281131
ISSUE DATE / TIME: 202509271441
Unit Type and Rh: 6200
Unit Type and Rh: 6200

## 2024-06-18 LAB — HEMOGLOBIN: Hemoglobin: 7.4 g/dL — ABNORMAL LOW (ref 12.0–15.0)

## 2024-06-18 MED ORDER — LACTATED RINGERS IV SOLN: INTRAVENOUS | Status: AC

## 2024-06-18 MED ORDER — PANTOPRAZOLE SODIUM 40 MG PO TBEC
40.0000 mg | DELAYED_RELEASE_TABLET | Freq: Every day | ORAL | Status: DC
  Administered 2024-06-18 – 2024-06-21 (×4): 40 mg via ORAL
  Filled 2024-06-18 (×4): qty 1

## 2024-06-18 NOTE — Progress Notes (Signed)
 Physical Therapy Treatment Patient Details Name: Betty Nielsen MRN: [id number] DOB: 1946/06/07 Today's Date: 06/18/2024   History of Present Illness presented to ER secondary to generalized weakness, nausea/vomiting, diarrhea; admitted for mangement of acute metabolic encephalopathy, acute infectious gastroenteritis.    PT Comments  Pt received in bed after receiving assist with bowel incontinence. Pt appears with flat affect, however this is due to pt's severe HOH and declining to wear her hearing aids. Discussed current situation at length with pt, PT goals, and plans for STR once medically cleared. Pt felt much better with explanation. Overall good tolerance for mobility, slightly limited due to fear of incontinence. Will continue to progress per POC.   If plan is discharge home, recommend the following: A little help with walking and/or transfers;A little help with bathing/dressing/bathroom   Can travel by private vehicle     Yes  Equipment Recommendations  Other (comment) (TBD at next LOC)    Recommendations for Other Services       Precautions / Restrictions Precautions Precautions: Fall Recall of Precautions/Restrictions: Intact Restrictions Weight Bearing Restrictions Per Provider Order: No     Mobility  Bed Mobility Overal bed mobility: Needs Assistance Bed Mobility: Supine to Sit     Supine to sit: Contact guard, HOB elevated, Used rails     General bed mobility comments:  (No physical assist to transition to EOB)    Transfers Overall transfer level: Needs assistance Equipment used: None Transfers: Sit to/from Stand, Bed to chair/wheelchair/BSC Sit to Stand: Contact guard assist   Step pivot transfers: Contact guard assist       General transfer comment:  (Pt stood from bed without physical assist and advanced a few steps to bedside chair without AD)    Ambulation/Gait               General Gait Details: declined by patient due to pain/fatigue  with efforts   Stairs             Wheelchair Mobility     Tilt Bed    Modified Rankin (Stroke Patients Only)       Balance                                            Communication Communication Communication: Impaired Factors Affecting Communication: Hearing impaired  Cognition Arousal: Alert Behavior During Therapy: WFL for tasks assessed/performed   PT - Cognitive impairments: No apparent impairments                         Following commands: Intact      Cueing Cueing Techniques: Verbal cues, Tactile cues  Exercises General Exercises - Lower Extremity Ankle Circles/Pumps: AROM, Both, 10 reps, Seated Long Arc Quad: AROM, Both, 10 reps, Seated Other Exercises Other Exercises:  (Pt educated at length regarding current hospital stay, POC, benefits of STR, and importance of calling for help if BBSC is needed.)    General Comments General comments (skin integrity, edema, etc.):  (Pt continues to express fatigue from ongoing loose watery stools)      Pertinent Vitals/Pain Pain Assessment Pain Assessment: No/denies pain    Home Living                          Prior Function  PT Goals (current goals can now be found in the care plan section) Acute Rehab PT Goals Patient Stated Goal: to return home Progress towards PT goals: Progressing toward goals    Frequency    Min 2X/week      PT Plan      Co-evaluation              AM-PAC PT 6 Clicks Mobility   Outcome Measure  Help needed turning from your back to your side while in a flat bed without using bedrails?: None Help needed moving from lying on your back to sitting on the side of a flat bed without using bedrails?: A Little Help needed moving to and from a bed to a chair (including a wheelchair)?: A Little Help needed standing up from a chair using your arms (e.g., wheelchair or bedside chair)?: A Little Help needed to walk in  hospital room?: A Little Help needed climbing 3-5 steps with a railing? : A Lot 6 Click Score: 18    End of Session Equipment Utilized During Treatment: Gait belt Activity Tolerance: Patient tolerated treatment well Patient left: with call bell/phone within reach;in chair;with chair alarm set Nurse Communication: Mobility status PT Visit Diagnosis: Muscle weakness (generalized) (M62.81);Difficulty in walking, not elsewhere classified (R26.2)     Time: 8491-8467 PT Time Calculation (min) (ACUTE ONLY): 24 min  Charges:    $Therapeutic Exercise: 8-22 mins $Therapeutic Activity: 8-22 mins PT General Charges $$ ACUTE PT VISIT: 1 Visit                    Darice Bohr, PTA  Darice JAYSON Bohr 06/18/2024, 3:50 PM

## 2024-06-18 NOTE — Progress Notes (Signed)
 Progress Note   Patient: Betty Nielsen FMW:[id number] DOB: 1946/03/03 DOA: 06/13/2024     4 DOS: the patient was seen and examined on 06/18/2024   Brief hospital course: 78 y.o. female with medical history significant for HTN, AAA s/p endovascular repair, prior stroke, CAD surgical hypothyroidism, CKD 5 who has declined dialysis planning referred by her nephrologist, hypothyroidism, anemia of chronic disease, lumbar spinal stenosis, being admitted with acute encephalopathy  in the setting of acute diarrhea.  She was brought in by her son with generalized weakness, increased somnolence and nausea vomiting and diarrhea starting earlier in the day.  Patient is very somnolent and unable to contribute to the history. In the ED, mild BP elevation to 164/79 with otherwise normal vitals. Labs notable for worsening renal function with creatinine of 5.1 up from most recent baseline of 3-4 with bicarb of 15, BUN 79.  Potassium normal..  WBC 13.9.  Hemoglobin at baseline of 8.  Urinalysis showing trace leukocytes few bacteria, protein.  CK 69. EKG with NSR at 65 CT head nonacute Chest x-ray not done Patient was given a 500 cc NS bolus Admission requested   9/25.  Patient having a lot of diarrhea and rectal tube was placed.  Started empirically on vancomycin  even though C. difficile testing was negative.  Enteropathogenic E. coli growing out of culture.  Started on Rocephin  for 3 days. 9/26.  Patient declined blood transfusion.  Had rectal tube in on the morning but came out in the afternoon.  Physical therapy recommending rehab 9/27.  Hemoglobin down to 6.3.  Agreeable to blood transfusion.  Creatinine has not improved.  Has declined dialysis. 9/28.  Agreeable to another unit of blood with hemoglobin 7.2. 9/29.  Hemoglobin 8.2 today.  Creatinine still impaired at 5.54.  Spoke with nephrology and will restart gentle fluid.  Assessment and Plan: * Acute infective gastroenteritis Enteropathogenic E  coli Completed 3 days of Rocephin  for enteropathogenic E. coli.  Empirically placed on vancomycin  even though C. difficile is negative since patient is sick.  Stool starting to form up.  Acute renal failure superimposed on stage 4 chronic kidney disease (HCC) Secondary to GI losses, baseline worsening of CKD creatinine 5.54 with a GFR of 7.  Nephrology following.  Patient has refused dialysis in the past.  Gentle IV fluid  Acute metabolic encephalopathy Mental status improved  Acute metabolic acidosis Secondary to worsening creatinine.    Hypothyroidism, postsurgical Continue levothyroxine   Anemia of chronic disease Patient transfused 2 units of packed red blood cells during the hospital course.  Hemoglobin 8.2  Acute cystitis with hematuria E. coli growing out of urine culture.  Rocephin  for 3 days will cover.  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  Was eating breakfast when I saw her.  Diarrhea is starting to slow down.  Kidney function still very impaired.  Hemoglobin improved from yesterday.  Physical Exam: Vitals:   06/18/24 0503 06/18/24 0540 06/18/24 0811 06/18/24 1230  BP: (!) 176/66 (!) 165/64 (!) 168/62 (!) 154/70  Pulse:   (!) 59 60  Resp:   17 19  Temp:  98.2 F (36.8  C) 98.2 F (36.8 C) 97.6 F (36.4 C)  TempSrc:  Oral    SpO2:   98% 96%  Weight:       Physical Exam HENT:     Head: Normocephalic.     Mouth/Throat:     Pharynx: No oropharyngeal exudate.  Eyes:     General: Lids are normal.     Conjunctiva/sclera: Conjunctivae normal.  Cardiovascular:      Rate and Rhythm: Normal rate and regular rhythm.     Heart sounds: Normal heart sounds, S1 normal and S2 normal.  Pulmonary:     Breath sounds: No decreased breath sounds, wheezing, rhonchi or rales.  Abdominal:     Palpations: Abdomen is soft.     Tenderness: There is no abdominal tenderness.  Musculoskeletal:     Right lower leg: Swelling present.     Left lower leg: Swelling present.  Skin:    General: Skin is warm.     Findings: No rash.  Neurological:     Mental Status: She is alert and oriented to person, place, and time.     Data Reviewed: White blood cell count 11.1, hemoglobin 8.2, platelet count 230, creatinine 5.54  Family Communication: Spoke with son on the phone  Disposition: Status is: Inpatient Remains inpatient appropriate because: Restart gentle fluids with creatinine still very impaired  Planned Discharge Destination: Rehab    Time spent: 28 minutes  Author: Charlie Patterson, MD 06/18/2024 1:07 PM  For on call review [email or web address].

## 2024-06-18 NOTE — Progress Notes (Signed)
 [facility]  ROUNDING NOTE   Subjective:   Betty Nielsen is a 78 year old female with past medical conditions including CAD, hypertension, stroke, and chronic kidney disease stage V.  Patient presents to the emergency department complaining of abdominal pain, diarrhea and weakness.  She has been admitted for Dehydration [E86.0] AKI (acute kidney injury) [N17.9] Altered mental status, unspecified altered mental status type [R41.82] Acute metabolic encephalopathy [G93.41] Diarrhea, unspecified type [R19.7]  Patient is known to our practice and is followed outpatient by Dr. Douglas.  Patient was last seen in office in May for routine follow-up.   Update Patient seen sitting up in bed Breakfast tray at bedside Denies pain  Creatinine 5.54   Objective:  Vital signs in last 24 hours:  Temp:  [97.9 F (36.6 C)-99.5 F (37.5 C)] 98.2 F (36.8 C) (09/29 0811) Pulse Rate:  [58-76] 59 (09/29 0811) Resp:  [17-18] 17 (09/29 0811) BP: (106-176)/(51-90) 168/62 (09/29 0811) SpO2:  [95 %-100 %] 98 % (09/29 0811)  Weight change:  Filed Weights   06/14/24 0814  Weight: 70.3 kg    Intake/Output: I/O last 3 completed shifts: In: 1040 [P.O.:720; Blood:320] Out: 601 [Urine:600; Stool:1]   Intake/Output this shift:  No intake/output data recorded.  Physical Exam: General: NAD  Head: Normocephalic, atraumatic. Moist oral mucosal membranes  Eyes: Anicteric  Lungs:  Clear to auscultation, normal effort  Heart: Regular rate and rhythm  Abdomen:  Soft, nontender  Extremities: No peripheral edema.  Neurologic: Awake, alert, conversant  Skin: Warm,dry, no rash  Access: None    Basic Metabolic Panel: Recent Labs  Lab 06/13/24 2021 06/14/24 0458 06/15/24 0453 06/16/24 0350 06/17/24 0444 06/17/24 1030 06/18/24 0355  NA 133*   < > 137 136 135 138 137  K 4.2   < > 3.2* 3.4* 3.7 3.8 3.7  CL 104   < > 103 103 104 101 103  CO2 15*   < > 19* 23 22 20* 21*  GLUCOSE  114*   < > 128* 91 82 103* 64*  BUN 79*   < > 93* 89* 89* 90* 89*  CREATININE 5.18*   < > 5.06* 5.26* 5.66* 5.50* 5.54*  CALCIUM  8.6*   < > 7.6* 7.7* 7.5* 7.9* 8.0*  MG 4.1*  --   --   --   --   --   --    < > = values in this interval not displayed.    Liver Function Tests: Recent Labs  Lab 06/13/24 2021 06/14/24 0458  AST 26 23  ALT 10 8  ALKPHOS 57 49  BILITOT 0.9 0.7  PROT 6.0* 5.2*  ALBUMIN 3.1* 2.7*   No results for input(s): LIPASE, AMYLASE in the last 168 hours. No results for input(s): AMMONIA in the last 168 hours.  CBC: Recent Labs  Lab 06/15/24 0453 06/16/24 0350 06/17/24 0444 06/17/24 1030 06/18/24 0355  WBC 12.3* 11.6* 13.0* 11.8* 11.1*  HGB 7.2* 6.3* 7.2* 7.2* 8.2*  HCT 22.0* 19.0* 21.9* 21.3* 24.4*  MCV 92.1 91.3 90.9 90.6 89.1  PLT 210 210 214 199 230    Cardiac Enzymes: Recent Labs  Lab 06/13/24 2021  CKTOTAL 69    BNP: Invalid input(s): POCBNP  CBG: Recent Labs  Lab 06/13/24 2053  GLUCAP 107*    Microbiology: Results for orders placed or performed during the hospital encounter of 06/13/24  Urine Culture (for pregnant, neutropenic or urologic patients or patients with an indwelling urinary catheter)  Status: Abnormal   Collection Time: 06/13/24  9:49 PM   Specimen: Urine, Catheterized  Result Value Ref Range Status   Specimen Description   Final    URINE, CATHETERIZED Performed at [facility], [address] [place], [place] [address]    Special Requests   Final    NONE Performed at [facility], [address] [place], [place] [address]    Culture >=100,000 COLONIES/mL ESCHERICHIA COLI (A)  Final   Report Status 06/15/2024 FINAL  Final   Organism ID, Bacteria ESCHERICHIA COLI (A)  Final      Susceptibility   Escherichia coli - MIC*    AMPICILLIN 4 SENSITIVE Sensitive     CEFAZOLIN  (URINE) Value in next row Sensitive      4 SENSITIVEThis is a modified FDA-approved test that has been  validated and its performance characteristics determined by the reporting laboratory.  This laboratory is certified under the Clinical Laboratory Improvement Amendments CLIA as qualified to perform high complexity clinical laboratory testing.    CEFEPIME Value in next row Sensitive      4 SENSITIVEThis is a modified FDA-approved test that has been validated and its performance characteristics determined by the reporting laboratory.  This laboratory is certified under the Clinical Laboratory Improvement Amendments CLIA as qualified to perform high complexity clinical laboratory testing.    ERTAPENEM Value in next row Sensitive      4 SENSITIVEThis is a modified FDA-approved test that has been validated and its performance characteristics determined by the reporting laboratory.  This laboratory is certified under the Clinical Laboratory Improvement Amendments CLIA as qualified to perform high complexity clinical laboratory testing.    CEFTRIAXONE  Value in next row Sensitive      4 SENSITIVEThis is a modified FDA-approved test that has been validated and its performance characteristics determined by the reporting laboratory.  This laboratory is certified under the Clinical Laboratory Improvement Amendments CLIA as qualified to perform high complexity clinical laboratory testing.    CIPROFLOXACIN Value in next row Sensitive      4 SENSITIVEThis is a modified FDA-approved test that has been validated and its performance characteristics determined by the reporting laboratory.  This laboratory is certified under the Clinical Laboratory Improvement Amendments CLIA as qualified to perform high complexity clinical laboratory testing.    GENTAMICIN Value in next row Sensitive      4 SENSITIVEThis is a modified FDA-approved test that has been validated and its performance characteristics determined by the reporting laboratory.  This laboratory is certified under the Clinical Laboratory Improvement Amendments CLIA as  qualified to perform high complexity clinical laboratory testing.    NITROFURANTOIN Value in next row Sensitive      4 SENSITIVEThis is a modified FDA-approved test that has been validated and its performance characteristics determined by the reporting laboratory.  This laboratory is certified under the Clinical Laboratory Improvement Amendments CLIA as qualified to perform high complexity clinical laboratory testing.    TRIMETH/SULFA Value in next row Sensitive      4 SENSITIVEThis is a modified FDA-approved test that has been validated and its performance characteristics determined by the reporting laboratory.  This laboratory is certified under the Clinical Laboratory Improvement Amendments CLIA as qualified to perform high complexity clinical laboratory testing.    AMPICILLIN/SULBACTAM Value in next row Sensitive      4 SENSITIVEThis is a modified FDA-approved test that has been validated and its performance characteristics determined by the reporting laboratory.  This laboratory is certified  under the Clinical Laboratory Improvement Amendments CLIA as qualified to perform high complexity clinical laboratory testing.    PIP/TAZO Value in next row Sensitive      <=4 SENSITIVEThis is a modified FDA-approved test that has been validated and its performance characteristics determined by the reporting laboratory.  This laboratory is certified under the Clinical Laboratory Improvement Amendments CLIA as qualified to perform high complexity clinical laboratory testing.    MEROPENEM Value in next row Sensitive      <=4 SENSITIVEThis is a modified FDA-approved test that has been validated and its performance characteristics determined by the reporting laboratory.  This laboratory is certified under the Clinical Laboratory Improvement Amendments CLIA as qualified to perform high complexity clinical laboratory testing.    * >=100,000 COLONIES/mL ESCHERICHIA COLI  Gastrointestinal Panel by PCR , Stool      Status: Abnormal   Collection Time: 06/13/24 11:41 PM   Specimen: Stool  Result Value Ref Range Status   Campylobacter species NOT DETECTED NOT DETECTED Final   Plesimonas shigelloides NOT DETECTED NOT DETECTED Final   Salmonella species NOT DETECTED NOT DETECTED Final   Yersinia enterocolitica NOT DETECTED NOT DETECTED Final   Vibrio species NOT DETECTED NOT DETECTED Final   Vibrio cholerae NOT DETECTED NOT DETECTED Final   Enteroaggregative E coli (EAEC) NOT DETECTED NOT DETECTED Final   Enteropathogenic E coli (EPEC) DETECTED (A) NOT DETECTED Final    Comment: RESULT CALLED TO, READ BACK BY AND VERIFIED WITH:  MYRDA [facility] AT 0154 06/14/24 JG    Enterotoxigenic E coli (ETEC) NOT DETECTED NOT DETECTED Final   Shiga like toxin producing E coli (STEC) NOT DETECTED NOT DETECTED Final   Shigella/Enteroinvasive E coli (EIEC) NOT DETECTED NOT DETECTED Final   Cryptosporidium NOT DETECTED NOT DETECTED Final   Cyclospora cayetanensis NOT DETECTED NOT DETECTED Final   Entamoeba histolytica NOT DETECTED NOT DETECTED Final   Giardia lamblia NOT DETECTED NOT DETECTED Final   Adenovirus F40/41 NOT DETECTED NOT DETECTED Final   Astrovirus NOT DETECTED NOT DETECTED Final   Norovirus GI/GII NOT DETECTED NOT DETECTED Final   Rotavirus A NOT DETECTED NOT DETECTED Final   Sapovirus (I, II, IV, and V) NOT DETECTED NOT DETECTED Final    Comment: Performed at [facility], [address] [place], [place] [address]  C Difficile Quick Screen w PCR reflex     Status: Abnormal   Collection Time: 06/13/24 11:41 PM   Specimen: Stool  Result Value Ref Range Status   C Diff antigen POSITIVE (A) NEGATIVE Final   C Diff toxin NEGATIVE NEGATIVE Final   C Diff interpretation Results are indeterminate. See PCR results.  Final    Comment: Performed at [facility], [address] [place], [place] [address]  C. Diff by PCR, Reflexed     Status: None   Collection Time: 06/13/24 11:41 PM  Result  Value Ref Range Status   Toxigenic C. Difficile by PCR NEGATIVE NEGATIVE Final    Comment: Patient is colonized with non toxigenic C. difficile. May not need treatment unless significant symptoms are present.   Hypervirulent Strain PRESUMPTIVE NEGATIVE PRESUMPTIVE NEGATIVE Final    Comment: Performed at [facility], [address] [place], [place] [address]    Coagulation Studies: No results for input(s): LABPROT, INR in the last 72 hours.  Urinalysis: No results for input(s): COLORURINE, LABSPEC, PHURINE, GLUCOSEU, HGBUR, BILIRUBINUR, KETONESUR, PROTEINUR, UROBILINOGEN, NITRITE, LEUKOCYTESUR in the last 72 hours.  Invalid input(s): APPERANCEUR  Imaging: No results found.    Medications:    lactated ringers  30 mL/hr at 06/18/24 0900    aspirin  EC  81 mg Oral Daily   atorvastatin   40 mg Oral QPM   calcitRIOL   0.25 mcg Oral Daily   cloNIDine   0.2 mg Oral BID   clopidogrel   75 mg Oral Daily   heparin  injection (subcutaneous)  5,000 Units Subcutaneous Q8H   hydrocortisone   Rectal TID   labetalol   300 mg Oral BID   latanoprost   1 drop Both Eyes QHS   levothyroxine   75 mcg Oral Q0600   pantoprazole  (PROTONIX ) IV  40 mg Intravenous Q24H   vancomycin   125 mg Oral QID   acetaminophen  **OR** acetaminophen , hydrALAZINE , HYDROcodone -acetaminophen , ondansetron  **OR** ondansetron  (ZOFRAN ) IV, simethicone   Assessment/ Plan:  Ms. ZALAYAH PIZZUTO is a 78 y.o.  female with past medical conditions including CAD, hypertension, stroke, and chronic kidney disease stage V.  Patient presents to the emergency department complaining of abdominal pain, diarrhea and weakness.  She has been admitted for Dehydration [E86.0] AKI (acute kidney injury) [N17.9] Altered mental status, unspecified altered mental status type [R41.82] Acute metabolic encephalopathy [G93.41] Diarrhea, unspecified type [R19.7]   Acute Kidney Injury on chronic kidney disease stage  V with baseline creatinine 4.27 and GFR of 10 on last office visit on 01/31/2024.  Acute kidney injury secondary to dehydration from GI losses No recent IV contrast exposure.  Creatinine on ED arrival 5.18.  Patient has been adamant during previous admissions and in office she would not be interested in renal replacement therapy.    Creatinine remains stable. Agree with gently IV hydration. Would consider palliative care consult for goals of care.   Lab Results  Component Value Date   CREATININE 5.54 (H) 06/18/2024   CREATININE 5.50 (H) 06/17/2024   CREATININE 5.66 (H) 06/17/2024    Intake/Output Summary (Last 24 hours) at 06/18/2024 1107 Last data filed at 06/18/2024 0410 Gross per 24 hour  Intake 1040 ml  Output 601 ml  Net 439 ml   2.  Acute metabolic acidosis, serum bicarb 15 on ED arrival.  S bicarb improved  3. Anemia of chronic kidney disease Lab Results  Component Value Date   HGB 8.2 (L) 06/18/2024  Patient received blood transfusion on 9/28.   4.  Hypertension with chronic kidney disease.  Home regimen includes clonidine  and labetalol , both have been restarted.     LOS: 4 Audie Stayer 9/29/202511:07 AM

## 2024-06-18 NOTE — Plan of Care (Signed)
 Pt hypertensive overnight, PRN hydralazine  given x 2. Pt up to bed side commode, hemorrhoid cream applied, rectal bleeding present.     Problem: Education: Goal: Knowledge of General Education information will improve Description: Including pain rating scale, medication(s)/side effects and non-pharmacologic comfort measures Outcome: Progressing   Problem: Health Behavior/Discharge Planning: Goal: Ability to manage health-related needs will improve Outcome: Progressing   Problem: Clinical Measurements: Goal: Ability to maintain clinical measurements within normal limits will improve Outcome: Progressing Goal: Will remain free from infection Outcome: Progressing Goal: Diagnostic test results will improve Outcome: Progressing Goal: Respiratory complications will improve Outcome: Progressing Goal: Cardiovascular complication will be avoided Outcome: Progressing   Problem: Activity: Goal: Risk for activity intolerance will decrease Outcome: Progressing   Problem: Nutrition: Goal: Adequate nutrition will be maintained Outcome: Progressing   Problem: Coping: Goal: Level of anxiety will decrease Outcome: Progressing   Problem: Elimination: Goal: Will not experience complications related to bowel motility Outcome: Progressing Goal: Will not experience complications related to urinary retention Outcome: Progressing   Problem: Pain Managment: Goal: General experience of comfort will improve and/or be controlled Outcome: Progressing   Problem: Safety: Goal: Ability to remain free from injury will improve Outcome: Progressing   Problem: Skin Integrity: Goal: Risk for impaired skin integrity will decrease Outcome: Progressing

## 2024-06-18 NOTE — Discharge Instructions (Signed)
  The coupon above is for oral vancomycin . This coupon will only work for [employer].    Do you feel isolated?  [facility] offers a [employer] that anyone can call toll free at [phone]. The friendship line is available 24 hours a day  [facility] is a Program of All-inclusive Care for the Elderly (PACE). Their mission is to promote and sustain the independence of seniors wishing to remain in the community. They provide seniors with comprehensive long-term health, social, medical and dietary care. Their program is a safe alternative to nursing home care. [phone]  [facility] Physical Address [facility] [address] [place], [place] [address] Phone: [phone]. . Online zoom yoga class, connect with others without leaving your home [facility] offers [place] dance cardio sessions for individuals via Zoom. This program provides: - Dance fitness activities Please contact program for more information. Servinganyone in need adults 18+ hiv/aids individuals families Call [phone]  Email [email or web address] to get more info  [employer] offers an online [employer] to individuals where they can receive help to focus on their best health. Whether you're a [employer] member or not, the neighborhood center offers a... Main Serviceshealth education  exercise & fitness  community support services  recreation  virtual support Other Servicessupport groups Servinganyone in need adults young adults teens seniors individuals families [email or web address] to get more info  Schedule on their website  [facility] offers an array of activities for adults age 49 and over. This program provides:- Fitness and health programs- Tech classes- Activity books Main Serviceshealth education  community support services  exercise & fitness  recreation  more education Servingseniors  Call  [phone]    For more resources go online to [email or web address] and type in you zipcode

## 2024-06-19 DIAGNOSIS — A09 Infectious gastroenteritis and colitis, unspecified: Secondary | ICD-10-CM | POA: Diagnosis not present

## 2024-06-19 DIAGNOSIS — G9341 Metabolic encephalopathy: Secondary | ICD-10-CM | POA: Diagnosis not present

## 2024-06-19 DIAGNOSIS — K922 Gastrointestinal hemorrhage, unspecified: Secondary | ICD-10-CM | POA: Diagnosis not present

## 2024-06-19 DIAGNOSIS — N179 Acute kidney failure, unspecified: Secondary | ICD-10-CM | POA: Diagnosis not present

## 2024-06-19 LAB — HEMOGLOBIN
Hemoglobin: 7 g/dL — ABNORMAL LOW (ref 12.0–15.0)
Hemoglobin: 7.1 g/dL — ABNORMAL LOW (ref 12.0–15.0)

## 2024-06-19 MED ORDER — SODIUM CHLORIDE 0.9% IV SOLUTION: Freq: Once | INTRAVENOUS | Status: DC

## 2024-06-19 NOTE — Progress Notes (Addendum)
 Progress Note   Patient: Betty Nielsen FMW:[id number] DOB: September 10, 1946 DOA: 06/13/2024     5 DOS: the patient was seen and examined on 06/19/2024   Brief hospital course: 78 y.o. female with medical history significant for HTN, AAA s/p endovascular repair, prior stroke, CAD surgical hypothyroidism, CKD 5 who has declined dialysis planning referred by her nephrologist, hypothyroidism, anemia of chronic disease, lumbar spinal stenosis, being admitted with acute encephalopathy  in the setting of acute diarrhea.  She was brought in by her son with generalized weakness, increased somnolence and nausea vomiting and diarrhea starting earlier in the day.  Patient is very somnolent and unable to contribute to the history. In the ED, mild BP elevation to 164/79 with otherwise normal vitals. Labs notable for worsening renal function with creatinine of 5.1 up from most recent baseline of 3-4 with bicarb of 15, BUN 79.  Potassium normal..  WBC 13.9.  Hemoglobin at baseline of 8.  Urinalysis showing trace leukocytes few bacteria, protein.  CK 69. EKG with NSR at 65 CT head nonacute Chest x-ray not done Patient was given a 500 cc NS bolus Admission requested   9/25.  Patient having a lot of diarrhea and rectal tube was placed.  Started empirically on vancomycin  even though C. difficile testing was negative.  Enteropathogenic E. coli growing out of culture.  Started on Rocephin  for 3 days. 9/26.  Patient declined blood transfusion.  Had rectal tube in on the morning but came out in the afternoon.  Physical therapy recommending rehab 9/27.  Hemoglobin down to 6.3.  Agreeable to blood transfusion.  Creatinine has not improved.  Has declined dialysis. 9/28.  Agreeable to another unit of blood with hemoglobin 7.2. 9/29.  Hemoglobin 8.2 today.  Creatinine still impaired at 5.54.  Spoke with nephrology and will restart gentle fluid. 9/30.  Had rectal bleeding overnight.  Holding aspirin , Plavix  and heparin  subcutaneous  injection.  Hemoglobin 7.0.  Will give 1 unit of packed red blood cells today  Assessment and Plan: * Acute infective gastroenteritis Enteropathogenic E coli Completed 3 days of Rocephin  for enteropathogenic E. coli.  Empirically placed on vancomycin  even though C. difficile is negative since last very sick on admission.    Lower GI bleed Holding aspirin  and Plavix  (Last dose on 9/29).  Transfuse 1 unit of packed red blood cells today secondary to hemoglobin of 7.0.  Can consider GI consultation but not the best candidate with impaired kidney function.  Could be hemorrhoidal bleeding or diverticular.  Acute renal failure superimposed on stage 4 chronic kidney disease (HCC) Secondary to GI losses, baseline worsening of CKD creatinine 5.54 with a GFR of 7.  Nephrology following.  Patient has refused dialysis in the past.  Gentle IV fluid.  Palliative care consultation.  Acute metabolic encephalopathy Mental status improved  Acute metabolic acidosis Secondary to worsening creatinine.    Hypothyroidism, postsurgical Continue levothyroxine   Anemia of chronic disease Patient transfused 2 units of packed red blood cells during the hospital course already.  With acute blood loss anemia and hemoglobin dropping down to 7.0, I will transfuse 1 unit of packed red blood cells today.  Acute cystitis with hematuria E. coli growing out of urine culture.  Completed Rocephin  for 3 days.  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  Continue atorvastatin   Essential hypertension On clonidine  and labetalol   History  of CVA (cerebrovascular accident) Hold aspirin  and Plavix .  Stroke risk higher holding these medications.  On atorvastatin         Subjective: Patient had some rectal bleeding overnight.  Hemoglobin did drop down to 7.0.   Will give another unit of packed red blood cells today.  Holding aspirin  and Plavix  and heparin  subcutaneous injections.  Physical Exam: Vitals:   06/19/24 1017 06/19/24 1148 06/19/24 1300 06/19/24 1345  BP: (!) 173/70 131/62 131/69 135/60  Pulse: 65 63 60 (!) 59  Resp:  18 20 19   Temp:  98.1 F (36.7 C) 98 F (36.7 C) 98.2 F (36.8 C)  TempSrc:   Axillary Axillary  SpO2:  100% 99% 100%  Weight:       Physical Exam HENT:     Head: Normocephalic.     Mouth/Throat:     Pharynx: No oropharyngeal exudate.  Eyes:     General: Lids are normal.     Conjunctiva/sclera: Conjunctivae normal.  Cardiovascular:     Rate and Rhythm: Normal rate and regular rhythm.     Heart sounds: Normal heart sounds, S1 normal and S2 normal.  Pulmonary:     Breath sounds: No decreased breath sounds, wheezing, rhonchi or rales.  Abdominal:     Palpations: Abdomen is soft.     Tenderness: There is no abdominal tenderness.  Musculoskeletal:     Right lower leg: Swelling present.     Left lower leg: Swelling present.  Skin:    General: Skin is warm.     Findings: No rash.  Neurological:     Mental Status: She is alert and oriented to person, place, and time.     Data Reviewed: Hemoglobin this morning 7.0.  Family Communication: Spoke with son on the phone  Disposition: Status is: Inpatient Remains inpatient appropriate because: With rectal bleeding overnight will hold aspirin  and Plavix .  Transfuse 1 unit of packed red blood cells.  Planned Discharge Destination: Rehab    Time spent: 28 minutes  Author: Charlie Patterson, MD 06/19/2024 1:47 PM  For on call review [email or web address].

## 2024-06-19 NOTE — Progress Notes (Signed)
 [facility]  ROUNDING NOTE   Subjective:   Betty Nielsen is a 78 year old female with past medical conditions including CAD, hypertension, stroke, and chronic kidney disease stage V.  Patient presents to the emergency department complaining of abdominal pain, diarrhea and weakness.  She has been admitted for Dehydration [E86.0] AKI (acute kidney injury) [N17.9] Altered mental status, unspecified altered mental status type [R41.82] Acute metabolic encephalopathy [G93.41] Diarrhea, unspecified type [R19.7]  Patient is known to our practice and is followed outpatient by Dr. Douglas.  Patient was last seen in office in May for routine follow-up.   Update Patient resting quietly Arousable for short periods Room air Appetite poor  Objective:  Vital signs in last 24 hours:  Temp:  [97.6 F (36.4 C)-98.7 F (37.1 C)] 98 F (36.7 C) (09/30 1300) Pulse Rate:  [60-65] 60 (09/30 1300) Resp:  [16-20] 20 (09/30 1300) BP: (131-180)/(62-77) 131/69 (09/30 1300) SpO2:  [98 %-100 %] 99 % (09/30 1300)  Weight change:  Filed Weights   06/14/24 0814  Weight: 70.3 kg    Intake/Output: I/O last 3 completed shifts: In: 527 [I.V.:527] Out: 601 [Urine:600; Stool:1]   Intake/Output this shift:  Total I/O In: 440 [P.O.:440] Out: -   Physical Exam: General: NAD  Head: Normocephalic, atraumatic. Moist oral mucosal membranes  Eyes: Anicteric  Lungs:  Clear to auscultation, normal effort  Heart: Regular rate and rhythm  Abdomen:  Soft, nontender  Extremities: No peripheral edema.  Neurologic: Somnolent  Skin: Warm,dry, no rash  Access: None    Basic Metabolic Panel: Recent Labs  Lab 06/13/24 2021 06/14/24 0458 06/15/24 0453 06/16/24 0350 06/17/24 0444 06/17/24 1030 06/18/24 0355  NA 133*   < > 137 136 135 138 137  K 4.2   < > 3.2* 3.4* 3.7 3.8 3.7  CL 104   < > 103 103 104 101 103  CO2 15*   < > 19* 23 22 20* 21*  GLUCOSE 114*   < > 128* 91 82 103* 64*  BUN  79*   < > 93* 89* 89* 90* 89*  CREATININE 5.18*   < > 5.06* 5.26* 5.66* 5.50* 5.54*  CALCIUM  8.6*   < > 7.6* 7.7* 7.5* 7.9* 8.0*  MG 4.1*  --   --   --   --   --   --    < > = values in this interval not displayed.    Liver Function Tests: Recent Labs  Lab 06/13/24 2021 06/14/24 0458  AST 26 23  ALT 10 8  ALKPHOS 57 49  BILITOT 0.9 0.7  PROT 6.0* 5.2*  ALBUMIN 3.1* 2.7*   No results for input(s): LIPASE, AMYLASE in the last 168 hours. No results for input(s): AMMONIA in the last 168 hours.  CBC: Recent Labs  Lab 06/15/24 0453 06/16/24 0350 06/17/24 0444 06/17/24 1030 06/18/24 0355 06/18/24 2133 06/19/24 0317 06/19/24 1007  WBC 12.3* 11.6* 13.0* 11.8* 11.1*  --   --   --   HGB 7.2* 6.3* 7.2* 7.2* 8.2* 7.4* 7.0* 7.1*  HCT 22.0* 19.0* 21.9* 21.3* 24.4*  --   --   --   MCV 92.1 91.3 90.9 90.6 89.1  --   --   --   PLT 210 210 214 199 230  --   --   --     Cardiac Enzymes: Recent Labs  Lab 06/13/24 2021  CKTOTAL 69    BNP: Invalid input(s): POCBNP  CBG: Recent Labs  Lab  06/13/24 2053  GLUCAP 107*    Microbiology: Results for orders placed or performed during the hospital encounter of 06/13/24  Urine Culture (for pregnant, neutropenic or urologic patients or patients with an indwelling urinary catheter)     Status: Abnormal   Collection Time: 06/13/24  9:49 PM   Specimen: Urine, Catheterized  Result Value Ref Range Status   Specimen Description   Final    URINE, CATHETERIZED Performed at [facility], [address] [place], [place] [address]    Special Requests   Final    NONE Performed at [facility], [address] [place], [place] [address]    Culture >=100,000 COLONIES/mL ESCHERICHIA COLI (A)  Final   Report Status 06/15/2024 FINAL  Final   Organism ID, Bacteria ESCHERICHIA COLI (A)  Final      Susceptibility   Escherichia coli - MIC*    AMPICILLIN 4 SENSITIVE Sensitive     CEFAZOLIN  (URINE) Value in next row  Sensitive      4 SENSITIVEThis is a modified FDA-approved test that has been validated and its performance characteristics determined by the reporting laboratory.  This laboratory is certified under the Clinical Laboratory Improvement Amendments CLIA as qualified to perform high complexity clinical laboratory testing.    CEFEPIME Value in next row Sensitive      4 SENSITIVEThis is a modified FDA-approved test that has been validated and its performance characteristics determined by the reporting laboratory.  This laboratory is certified under the Clinical Laboratory Improvement Amendments CLIA as qualified to perform high complexity clinical laboratory testing.    ERTAPENEM Value in next row Sensitive      4 SENSITIVEThis is a modified FDA-approved test that has been validated and its performance characteristics determined by the reporting laboratory.  This laboratory is certified under the Clinical Laboratory Improvement Amendments CLIA as qualified to perform high complexity clinical laboratory testing.    CEFTRIAXONE  Value in next row Sensitive      4 SENSITIVEThis is a modified FDA-approved test that has been validated and its performance characteristics determined by the reporting laboratory.  This laboratory is certified under the Clinical Laboratory Improvement Amendments CLIA as qualified to perform high complexity clinical laboratory testing.    CIPROFLOXACIN Value in next row Sensitive      4 SENSITIVEThis is a modified FDA-approved test that has been validated and its performance characteristics determined by the reporting laboratory.  This laboratory is certified under the Clinical Laboratory Improvement Amendments CLIA as qualified to perform high complexity clinical laboratory testing.    GENTAMICIN Value in next row Sensitive      4 SENSITIVEThis is a modified FDA-approved test that has been validated and its performance characteristics determined by the reporting laboratory.  This  laboratory is certified under the Clinical Laboratory Improvement Amendments CLIA as qualified to perform high complexity clinical laboratory testing.    NITROFURANTOIN Value in next row Sensitive      4 SENSITIVEThis is a modified FDA-approved test that has been validated and its performance characteristics determined by the reporting laboratory.  This laboratory is certified under the Clinical Laboratory Improvement Amendments CLIA as qualified to perform high complexity clinical laboratory testing.    TRIMETH/SULFA Value in next row Sensitive      4 SENSITIVEThis is a modified FDA-approved test that has been validated and its performance characteristics determined by the reporting laboratory.  This laboratory is certified under the Clinical Laboratory Improvement Amendments CLIA as qualified to perform high complexity clinical  laboratory testing.    AMPICILLIN/SULBACTAM Value in next row Sensitive      4 SENSITIVEThis is a modified FDA-approved test that has been validated and its performance characteristics determined by the reporting laboratory.  This laboratory is certified under the Clinical Laboratory Improvement Amendments CLIA as qualified to perform high complexity clinical laboratory testing.    PIP/TAZO Value in next row Sensitive      <=4 SENSITIVEThis is a modified FDA-approved test that has been validated and its performance characteristics determined by the reporting laboratory.  This laboratory is certified under the Clinical Laboratory Improvement Amendments CLIA as qualified to perform high complexity clinical laboratory testing.    MEROPENEM Value in next row Sensitive      <=4 SENSITIVEThis is a modified FDA-approved test that has been validated and its performance characteristics determined by the reporting laboratory.  This laboratory is certified under the Clinical Laboratory Improvement Amendments CLIA as qualified to perform high complexity clinical laboratory testing.    *  >=100,000 COLONIES/mL ESCHERICHIA COLI  Gastrointestinal Panel by PCR , Stool     Status: Abnormal   Collection Time: 06/13/24 11:41 PM   Specimen: Stool  Result Value Ref Range Status   Campylobacter species NOT DETECTED NOT DETECTED Final   Plesimonas shigelloides NOT DETECTED NOT DETECTED Final   Salmonella species NOT DETECTED NOT DETECTED Final   Yersinia enterocolitica NOT DETECTED NOT DETECTED Final   Vibrio species NOT DETECTED NOT DETECTED Final   Vibrio cholerae NOT DETECTED NOT DETECTED Final   Enteroaggregative E coli (EAEC) NOT DETECTED NOT DETECTED Final   Enteropathogenic E coli (EPEC) DETECTED (A) NOT DETECTED Final    Comment: RESULT CALLED TO, READ BACK BY AND VERIFIED WITH:  MYRDA [facility] AT 0154 06/14/24 JG    Enterotoxigenic E coli (ETEC) NOT DETECTED NOT DETECTED Final   Shiga like toxin producing E coli (STEC) NOT DETECTED NOT DETECTED Final   Shigella/Enteroinvasive E coli (EIEC) NOT DETECTED NOT DETECTED Final   Cryptosporidium NOT DETECTED NOT DETECTED Final   Cyclospora cayetanensis NOT DETECTED NOT DETECTED Final   Entamoeba histolytica NOT DETECTED NOT DETECTED Final   Giardia lamblia NOT DETECTED NOT DETECTED Final   Adenovirus F40/41 NOT DETECTED NOT DETECTED Final   Astrovirus NOT DETECTED NOT DETECTED Final   Norovirus GI/GII NOT DETECTED NOT DETECTED Final   Rotavirus A NOT DETECTED NOT DETECTED Final   Sapovirus (I, II, IV, and V) NOT DETECTED NOT DETECTED Final    Comment: Performed at [facility], [address] [place], [place] [address]  C Difficile Quick Screen w PCR reflex     Status: Abnormal   Collection Time: 06/13/24 11:41 PM   Specimen: Stool  Result Value Ref Range Status   C Diff antigen POSITIVE (A) NEGATIVE Final   C Diff toxin NEGATIVE NEGATIVE Final   C Diff interpretation Results are indeterminate. See PCR results.  Final    Comment: Performed at [facility], [address] [place], [place] [address]  C.  Diff by PCR, Reflexed     Status: None   Collection Time: 06/13/24 11:41 PM  Result Value Ref Range Status   Toxigenic C. Difficile by PCR NEGATIVE NEGATIVE Final    Comment: Patient is colonized with non toxigenic C. difficile. May not need treatment unless significant symptoms are present.   Hypervirulent Strain PRESUMPTIVE NEGATIVE PRESUMPTIVE NEGATIVE Final    Comment: Performed at [facility], [address] [place], [place] [address]  Coagulation Studies: No results for input(s): LABPROT, INR in the last 72 hours.  Urinalysis: No results for input(s): COLORURINE, LABSPEC, PHURINE, GLUCOSEU, HGBUR, BILIRUBINUR, KETONESUR, PROTEINUR, UROBILINOGEN, NITRITE, LEUKOCYTESUR in the last 72 hours.  Invalid input(s): APPERANCEUR     Imaging: No results found.    Medications:      sodium chloride    Intravenous Once   atorvastatin   40 mg Oral QPM   calcitRIOL   0.25 mcg Oral Daily   cloNIDine   0.2 mg Oral BID   heparin  injection (subcutaneous)  5,000 Units Subcutaneous Q8H   hydrocortisone   Rectal TID   labetalol   300 mg Oral BID   latanoprost   1 drop Both Eyes QHS   levothyroxine   75 mcg Oral Q0600   pantoprazole   40 mg Oral Daily   vancomycin   125 mg Oral QID   acetaminophen  **OR** acetaminophen , hydrALAZINE , HYDROcodone -acetaminophen , ondansetron  **OR** ondansetron  (ZOFRAN ) IV, simethicone   Assessment/ Plan:  [name] is a 78 y.o.  female with past medical conditions including CAD, hypertension, stroke, and chronic kidney disease stage V.  Patient presents to the emergency department complaining of abdominal pain, diarrhea and weakness.  She has been admitted for Dehydration [E86.0] AKI (acute kidney injury) [N17.9] Altered mental status, unspecified altered mental status type [R41.82] Acute metabolic encephalopathy [G93.41] Diarrhea, unspecified type [R19.7]   Acute Kidney Injury on chronic kidney disease stage V  with baseline creatinine 4.27 and GFR of 10 on last office visit on 01/31/2024.  Acute kidney injury secondary to dehydration from GI losses No recent IV contrast exposure.  Creatinine on ED arrival 5.18.  Patient has been adamant during previous admissions and in office she would not be interested in renal replacement therapy.    Creatinine remains elevated but stable. Recommend palliative care to determine goals of care.  Lab Results  Component Value Date   CREATININE 5.54 (H) 06/18/2024   CREATININE 5.50 (H) 06/17/2024   CREATININE 5.66 (H) 06/17/2024    Intake/Output Summary (Last 24 hours) at 06/19/2024 1308 Last data filed at 06/19/2024 1000 Gross per 24 hour  Intake 967 ml  Output --  Net 967 ml   2.  Acute metabolic acidosis, serum bicarb 15 on ED arrival.  S bicarb improved  3. Anemia of chronic kidney disease Lab Results  Component Value Date   HGB 7.1 (L) 06/19/2024  Patient received blood transfusion on 9/28. Hgb remains low  4.  Hypertension with chronic kidney disease.  Home regimen includes clonidine  and labetalol , both have been restarted. Blood pressure stable for this patient     LOS: 5 Crystallee Werden 9/30/20251:08 PM

## 2024-06-19 NOTE — Assessment & Plan Note (Signed)
 Holding aspirin  and Plavix  (Last dose on 9/29).  Transfuse 1 unit of packed red blood cells today secondary to hemoglobin of 7.0.  Can consider GI consultation but not the best candidate with impaired kidney function.  Could be hemorrhoidal bleeding or diverticular.

## 2024-06-19 NOTE — TOC Initial Note (Addendum)
 Transition of Care [facility]) - Initial/Assessment Note    Patient Details  Name: Betty Nielsen MRN: [id number] Date of Birth: [birth year]  Transition of Care [facility]) CM/SW Contact:    Lauraine JAYSON Carpen, LCSW Phone Number: 06/19/2024, 12:34 PM  Clinical Narrative:    CSW met with patient. No family at bedside. CSW introduced role and explained that PT recommendations would be discussed. Patient is very hard of hearing and did not have her hearing aides so CSW wrote down most of our conversation. Patient is unsure if she wants SNF placement and would like to discuss with her son first. CSW asked if referral could be sent out to see what her options are but she asked CSW to wait until she talks to her son. Patient said he should be coming by on his lunch break. No further concerns. CSW will continue to follow patient for support and facilitate discharge once medically stable.              1:40 pm: CSW met with son after he left patient's room. They did discuss SNF. CSW made him aware of how well she was moving with PT. He will discuss with his wife tonight. CSW will follow up tomorrow to determine decision.  Expected Discharge Plan: Skilled Nursing Facility Barriers to Discharge: Continued Medical Work up   Patient Goals and CMS Choice            Expected Discharge Plan and Services     Post Acute Care Choice:  (TBD) Living arrangements for the past 2 months: Single Family Home                                      Prior Living Arrangements/Services Living arrangements for the past 2 months: Single Family Home Lives with:: Self Patient language and need for interpreter reviewed:: Yes Do you feel safe going back to the place where you live?: Yes      Need for Family Participation in Patient Care: Yes (Comment)     Criminal Activity/Legal Involvement Pertinent to Current Situation/Hospitalization: No - Comment as needed  Activities of Daily Living      Permission  Sought/Granted                  Emotional Assessment Appearance:: Appears stated age Attitude/Demeanor/Rapport: Engaged, Gracious Affect (typically observed): Accepting, Appropriate, Calm, Pleasant Orientation: : Oriented to Self, Oriented to Place, Oriented to  Time, Oriented to Situation Alcohol / Substance Use: Not Applicable Psych Involvement: No (comment)  Admission diagnosis:  Dehydration [E86.0] AKI (acute kidney injury) [N17.9] Altered mental status, unspecified altered mental status type [R41.82] Acute metabolic encephalopathy [G93.41] Diarrhea, unspecified type [R19.7] Patient Active Problem List   Diagnosis Date Noted   Acute cystitis with hematuria 06/15/2024   Acute infective gastroenteritis 06/14/2024   Coronary artery disease 06/14/2024   Overweight (BMI 25.0-29.9) 06/14/2024   Acute metabolic encephalopathy 06/13/2024   Herniated nucleus pulposus, L2-3 (Right) 09/12/2023   Chronic anticoagulation (Plavix ) 08/17/2023   Hard of hearing 08/17/2023   Chronic low back pain (1ry area of Pain) (Bilateral) w/o sciatica 08/17/2023   Chronic lower extremity pain (2ry area of Pain) (Bilateral) 08/17/2023   Weakness of lower extremities (Bilateral) 08/17/2023   Chronic hip pain (3ry area of Pain) (Bilateral) 08/17/2023   Chronic knee pain (4th area of Pain) (Bilateral) 08/17/2023   Numbness and tingling of feet (Bilateral) 08/17/2023  Chronic neck pain (5th area of Pain) (Bilateral) 08/17/2023   Chronic shoulder pain (6th area of Pain) (Bilateral) 08/17/2023   Numbness and tingling in hands (intermittent) (Bilateral) 08/17/2023   Abnormal MRI, lumbar spine (06/25/2023) 08/16/2023   Chronic pain syndrome 08/16/2023   Pharmacologic therapy 08/16/2023   Disorder of skeletal system 08/16/2023   Problems influencing health status 08/16/2023   Spinal stenosis 06/26/2023   Hypertensive urgency 06/25/2023   Acute metabolic acidosis 06/25/2023   Inadequate pain control  06/25/2023   Benign hypertensive kidney disease with chronic kidney disease 03/09/2023   Proteinuria 03/09/2023   Prediabetes 07/06/2022   Essential hypertension 07/06/2022   Renal artery stenosis 07/14/2021   Acute renal failure superimposed on stage 4 chronic kidney disease (HCC)    Hyperkalemia    Accelerated hypertension 06/09/2021   Left upper extremity numbness 06/07/2021   Left facial numbness 06/07/2021   Elevated serum creatinine 06/07/2021   Anemia of chronic disease 11/21/2019   Hypo-osmolality and hyponatremia 11/21/2019   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/21/2019   Chronic kidney disease (CKD), stage V (HCC) 11/21/2019   History of CVA (cerebrovascular accident) 10/27/2019   Headache 10/27/2016   Hyperlipidemia 10/27/2016   History of non-ST elevation myocardial infarction (NSTEMI) 10/25/2016   History of coronary angioplasty 10/25/2016   BPV (benign positional vertigo) 06/15/2016   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), [place] - [address] [place]) [place] [address] Phone: [phone] Fax: [phone]     Social Drivers of Health (SDOH) Social History: SDOH Screenings   Food Insecurity: No Food Insecurity (06/14/2024)  Housing: Low Risk  (06/14/2024)  Transportation Needs: No Transportation Needs (06/14/2024)  Utilities: Not At Risk (06/14/2024)  Alcohol Screen: Low Risk  (09/01/2023)   Depression (PHQ2-9): Low Risk  (12/30/2023)  Financial Resource Strain: Low Risk  (09/01/2023)  Physical Activity: Inactive (09/01/2023)  Social Connections: Socially Isolated (06/14/2024)  Stress: No Stress Concern Present (09/01/2023)  Tobacco Use: Medium Risk (12/30/2023)  Health Literacy: Adequate Health Literacy (09/01/2023)   SDOH Interventions:     Readmission Risk Interventions     No data to display

## 2024-06-20 DIAGNOSIS — R4182 Altered mental status, unspecified: Secondary | ICD-10-CM

## 2024-06-20 DIAGNOSIS — E860 Dehydration: Secondary | ICD-10-CM | POA: Diagnosis not present

## 2024-06-20 DIAGNOSIS — N179 Acute kidney failure, unspecified: Secondary | ICD-10-CM | POA: Diagnosis not present

## 2024-06-20 DIAGNOSIS — Z515 Encounter for palliative care: Secondary | ICD-10-CM | POA: Diagnosis not present

## 2024-06-20 DIAGNOSIS — R197 Diarrhea, unspecified: Secondary | ICD-10-CM

## 2024-06-20 DIAGNOSIS — A09 Infectious gastroenteritis and colitis, unspecified: Secondary | ICD-10-CM | POA: Diagnosis not present

## 2024-06-20 LAB — CBC
HCT: 24.6 % — ABNORMAL LOW (ref 36.0–46.0)
Hemoglobin: 8 g/dL — ABNORMAL LOW (ref 12.0–15.0)
MCH: 30.1 pg (ref 26.0–34.0)
MCHC: 32.5 g/dL (ref 30.0–36.0)
MCV: 92.5 fL (ref 80.0–100.0)
Platelets: 198 K/uL (ref 150–400)
RBC: 2.66 MIL/uL — ABNORMAL LOW (ref 3.87–5.11)
RDW: 15.4 % (ref 11.5–15.5)
WBC: 9.9 K/uL (ref 4.0–10.5)
nRBC: 0 % (ref 0.0–0.2)

## 2024-06-20 LAB — BPAM RBC
Blood Product Expiration Date: 202510192359
ISSUE DATE / TIME: 202509301306
Unit Type and Rh: 6200

## 2024-06-20 LAB — BASIC METABOLIC PANEL WITH GFR
Anion gap: 11 (ref 5–15)
BUN: 86 mg/dL — ABNORMAL HIGH (ref 8–23)
CO2: 22 mmol/L (ref 22–32)
Calcium: 8 mg/dL — ABNORMAL LOW (ref 8.9–10.3)
Chloride: 104 mmol/L (ref 98–111)
Creatinine, Ser: 5.42 mg/dL — ABNORMAL HIGH (ref 0.44–1.00)
GFR, Estimated: 8 mL/min — ABNORMAL LOW (ref >60)
Glucose, Bld: 74 mg/dL (ref 70–99)
Potassium: 4.1 mmol/L (ref 3.5–5.1)
Sodium: 137 mmol/L (ref 135–145)

## 2024-06-20 LAB — TYPE AND SCREEN
ABO/RH(D): AB POS
Antibody Screen: NEGATIVE
Unit division: 0

## 2024-06-20 MED ORDER — SIMETHICONE 80 MG PO CHEW
80.0000 mg | CHEWABLE_TABLET | Freq: Four times a day (QID) | ORAL | Status: DC | PRN
  Administered 2024-06-20: 80 mg via ORAL
  Filled 2024-06-20: qty 1

## 2024-06-20 NOTE — Plan of Care (Signed)
  Problem: Health Behavior/Discharge Planning: Goal: Ability to manage health-related needs will improve Outcome: Progressing   Problem: Clinical Measurements: Goal: Will remain free from infection Outcome: Progressing Goal: Respiratory complications will improve Outcome: Progressing Goal: Cardiovascular complication will be avoided Outcome: Progressing   Problem: Nutrition: Goal: Adequate nutrition will be maintained Outcome: Progressing   Problem: Pain Managment: Goal: General experience of comfort will improve and/or be controlled Outcome: Progressing   Problem: Safety: Goal: Ability to remain free from injury will improve Outcome: Progressing

## 2024-06-20 NOTE — Progress Notes (Signed)
 PT Cancellation Note  Patient Details Name: NEYA CREEGAN MRN: [id number] DOB: 14-Dec-1945   Cancelled Treatment:     Several attempts to see pt who was occupied with Nursing, Nephrology, and later returned to bed and too fatigued for PT session. Will re-attempt per POC.    Darice JAYSON Bohr 06/20/2024, 3:29 PM

## 2024-06-20 NOTE — Progress Notes (Signed)
 PROGRESS NOTE    Betty Nielsen  FMW:[id number] DOB: March 17, 1946 DOA: 06/13/2024 PCP: Sowles, Krichna, MD    Brief Narrative:  78 y.o. female with medical history significant for HTN, AAA s/p endovascular repair, prior stroke, CAD surgical hypothyroidism, CKD 5 who has declined dialysis planning referred by her nephrologist, hypothyroidism, anemia of chronic disease, lumbar spinal stenosis, being admitted with acute encephalopathy  in the setting of acute diarrhea.  She was brought in by her son with generalized weakness, increased somnolence and nausea vomiting and diarrhea starting earlier in the day.  Patient is very somnolent and unable to contribute to the history.    9/25.  Patient having a lot of diarrhea and rectal tube was placed.  Started empirically on vancomycin  even though C. difficile testing was negative.  Enteropathogenic E. coli growing out of culture.  Started on Rocephin  for 3 days. 9/26.  Patient declined blood transfusion.  Had rectal tube in on the morning but came out in the afternoon.  Physical therapy recommending rehab 9/27.  Hemoglobin down to 6.3.  Agreeable to blood transfusion.  Creatinine has not improved.  Has declined dialysis. 9/28.  Agreeable to another unit of blood with hemoglobin 7.2. 9/29.  Hemoglobin 8.2 today.  Creatinine still impaired at 5.54.  Spoke with nephrology and will restart gentle fluid. 9/30.  Had rectal bleeding overnight.  Holding aspirin , Plavix  and heparin  subcutaneous injection.  Hemoglobin 7.0.  Will give 1 unit of packed red blood cells today 10/1: Clinical status overall unchanged.  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 coli.  Was empirically placed on p.o. vancomycin  though there is no indication.  C. difficile antigen positive but toxin and confirmatory PCR have been negative.  Discontinue Vanco   Lower GI bleed Holding aspirin  and Plavix  (Last dose on 9/29).  Transfuse 1 unit of packed red blood cells today secondary to hemoglobin of 7.0.  Hemoglobin is stabilized.  To continue to hold aspirin  and Plavix  for now.  Consider restarting slowly on 10/2   Acute renal failure superimposed on stage 4 chronic kidney disease (HCC) Secondary to GI losses, baseline worsening of CKD creatinine 5.54 with a GFR of 7.  Nephrology following.  Patient has refused dialysis in the past.  Vital care consultation.  Decision regarding dialysis is still undetermined   Acute metabolic encephalopathy Status waxes and wanes.  Patient seems withdrawn with poor participation in interview   Acute metabolic acidosis Secondary to worsening creatinine.     Hypothyroidism, postsurgical Continue levothyroxine    Anemia of chronic disease Transfused total of 3 units packed red cells during admission.  Currently DAPT on hold.  Monitor hemoglobin daily.   Acute cystitis with hematuria E. coli growing out of urine culture.  Completed Rocephin  for 3 days.   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  Continue statin   Essential hypertension On clonidine  and labetalol    History of CVA (cerebrovascular accident) Hold  aspirin  and Plavix .  Stroke risk higher holding these medications.  On atorvastatin     DVT prophylaxis: SCDs Code Status: Full Family Communication: None today.  Palliative care discussed with family Disposition Plan: Status is: Inpatient Remains inpatient appropriate because: Multiple acute issues as above   Level of care: Progressive  Consultants:  Nephrology Palliative care  Procedures:  None  Antimicrobials: None   Subjective: Seen and examined.  Somnolent.  Poor participation in interview.  Objective: Vitals:   06/19/24 2310 06/20/24 0530 06/20/24 0828 06/20/24 1116  BP: (!) 171/74 (!) 165/75 131/70 (!) 151/74  Pulse: (!) 59 (!) 56 71 62  Resp: 18  17 17   Temp: 98.7 F (37.1 C)  98.3 F (36.8 C) 98.8 F (37.1 C)  TempSrc: Oral     SpO2: 96% 99% 98% 99%  Weight:        Intake/Output Summary (Last 24 hours) at 06/20/2024 1640 Last data filed at 06/19/2024 1940 Gross per 24 hour  Intake 550 ml  Output --  Net 550 ml   Filed Weights   06/14/24 0814  Weight: 70.3 kg    Examination:  General exam: Appears withdrawn and frail Respiratory system: Clear to auscultation. Respiratory effort normal. Cardiovascular system: S1-S2, RRR, no murmurs, no pedal edema Gastrointestinal system: Soft, NT/ND, normal bowel sounds Central nervous system: Alert.  Oriented times 2.  Unable to fully assess neurologic status Extremities: Symmetric 5 x 5 power. Skin: No rashes, lesions or ulcers Psychiatry: Judgement and insight appear impaired. Mood & affect flattened.     Data Reviewed: I have personally reviewed following labs and imaging studies  CBC: Recent Labs  Lab 06/16/24 0350 06/17/24 0444 06/17/24 1030 06/18/24 0355 06/18/24 2133 06/19/24 0317 06/19/24 1007 06/20/24 0447  WBC 11.6* 13.0* 11.8* 11.1*  --   --   --  9.9  HGB 6.3* 7.2* 7.2* 8.2* 7.4* 7.0* 7.1* 8.0*  HCT 19.0* 21.9* 21.3* 24.4*  --   --   --  24.6*  MCV 91.3 90.9 90.6 89.1  --   --   --   92.5  PLT 210 214 199 230  --   --   --  198   Basic Metabolic Panel: Recent Labs  Lab 06/13/24 2021 06/14/24 0458 06/16/24 0350 06/17/24 0444 06/17/24 1030 06/18/24 0355 06/20/24 0447  NA 133*   < > 136 135 138 137 137  K 4.2   < > 3.4* 3.7 3.8 3.7 4.1  CL 104   < > 103 104 101 103 104  CO2 15*   < > 23 22 20* 21* 22  GLUCOSE 114*   < > 91 82 103* 64* 74  BUN 79*   < > 89* 89* 90* 89* 86*  CREATININE 5.18*   < > 5.26* 5.66* 5.50* 5.54* 5.42*  CALCIUM  8.6*   < > 7.7* 7.5* 7.9* 8.0* 8.0*  MG 4.1*  --   --   --   --   --   --    < > = values in this interval not displayed.   GFR: Estimated Creatinine Clearance: 8 mL/min (A) (by C-G formula based on SCr of 5.42 mg/dL (H)). Liver Function Tests: Recent Labs  Lab 06/13/24 2021 06/14/24 0458  AST 26 23  ALT 10 8  ALKPHOS 57 49  BILITOT 0.9 0.7  PROT 6.0* 5.2*  ALBUMIN 3.1* 2.7*   No results for input(s): LIPASE, AMYLASE in the last 168 hours. No results for input(s): AMMONIA in the last 168 hours. Coagulation Profile: No results for input(s): INR, PROTIME in the last 168 hours. Cardiac Enzymes: Recent Labs  Lab 06/13/24 2021  CKTOTAL 69   BNP (last 3 results) No results for input(s): PROBNP in the last 8760 hours. HbA1C: No results for input(s): HGBA1C in the last 72 hours. CBG: Recent Labs  Lab 06/13/24 2053  GLUCAP 107*   Lipid Profile: No results for input(s): CHOL, HDL, LDLCALC, TRIG, CHOLHDL, LDLDIRECT in the last 72 hours. Thyroid  Function Tests: No results for input(s): TSH, T4TOTAL, FREET4, T3FREE, THYROIDAB in the last 72 hours. Anemia Panel: No results for input(s): VITAMINB12, FOLATE, FERRITIN, TIBC, IRON , RETICCTPCT in the last 72 hours. Sepsis Labs: No results for input(s): PROCALCITON, LATICACIDVEN in the last 168 hours.  Recent Results (from the past 240 hours)  Urine Culture (for pregnant, neutropenic or urologic patients or patients with  an indwelling urinary catheter)     Status: Abnormal   Collection Time: 06/13/24  9:49 PM   Specimen: Urine, Catheterized  Result Value Ref Range Status   Specimen Description   Final    URINE, CATHETERIZED Performed at [facility], [address], [place], [place] [address]    Special Requests   Final    NONE Performed at [facility], [address] [place], [place] [address]    Culture >=100,000 COLONIES/mL ESCHERICHIA COLI (A)  Final   Report Status 06/15/2024 FINAL  Final   Organism ID, Bacteria ESCHERICHIA COLI (A)  Final      Susceptibility   Escherichia coli - MIC*    AMPICILLIN 4 SENSITIVE Sensitive     CEFAZOLIN  (URINE) Value in next row Sensitive      4 SENSITIVEThis is a modified FDA-approved test that has been validated and its performance characteristics determined by the reporting laboratory.  This laboratory is certified under the Clinical Laboratory Improvement Amendments CLIA as qualified to perform high complexity clinical laboratory testing.    CEFEPIME Value in next row Sensitive      4 SENSITIVEThis is a modified FDA-approved test that has been validated and its performance characteristics determined by the reporting laboratory.  This laboratory is certified under the Clinical Laboratory Improvement Amendments CLIA as qualified to perform high complexity clinical laboratory testing.    ERTAPENEM Value in next row Sensitive      4 SENSITIVEThis is a modified FDA-approved test that has been validated and its performance characteristics determined by the reporting laboratory.  This laboratory is certified under the Clinical Laboratory Improvement Amendments CLIA as qualified to perform high complexity clinical laboratory testing.    CEFTRIAXONE  Value in next row Sensitive      4 SENSITIVEThis is a modified FDA-approved test that has been validated and its performance characteristics determined by the reporting laboratory.  This laboratory is certified  under the Clinical Laboratory Improvement Amendments CLIA as qualified to perform high complexity clinical laboratory testing.    CIPROFLOXACIN Value in next row Sensitive      4 SENSITIVEThis is a modified FDA-approved test that has been validated and its performance characteristics determined by the reporting laboratory.  This laboratory is certified under the Clinical Laboratory Improvement Amendments CLIA as qualified to perform high complexity clinical laboratory testing.    GENTAMICIN Value in next row Sensitive  4 SENSITIVEThis is a modified FDA-approved test that has been validated and its performance characteristics determined by the reporting laboratory.  This laboratory is certified under the Clinical Laboratory Improvement Amendments CLIA as qualified to perform high complexity clinical laboratory testing.    NITROFURANTOIN Value in next row Sensitive      4 SENSITIVEThis is a modified FDA-approved test that has been validated and its performance characteristics determined by the reporting laboratory.  This laboratory is certified under the Clinical Laboratory Improvement Amendments CLIA as qualified to perform high complexity clinical laboratory testing.    TRIMETH/SULFA Value in next row Sensitive      4 SENSITIVEThis is a modified FDA-approved test that has been validated and its performance characteristics determined by the reporting laboratory.  This laboratory is certified under the Clinical Laboratory Improvement Amendments CLIA as qualified to perform high complexity clinical laboratory testing.    AMPICILLIN/SULBACTAM Value in next row Sensitive      4 SENSITIVEThis is a modified FDA-approved test that has been validated and its performance characteristics determined by the reporting laboratory.  This laboratory is certified under the Clinical Laboratory Improvement Amendments CLIA as qualified to perform high complexity clinical laboratory testing.    PIP/TAZO Value in next row  Sensitive      <=4 SENSITIVEThis is a modified FDA-approved test that has been validated and its performance characteristics determined by the reporting laboratory.  This laboratory is certified under the Clinical Laboratory Improvement Amendments CLIA as qualified to perform high complexity clinical laboratory testing.    MEROPENEM Value in next row Sensitive      <=4 SENSITIVEThis is a modified FDA-approved test that has been validated and its performance characteristics determined by the reporting laboratory.  This laboratory is certified under the Clinical Laboratory Improvement Amendments CLIA as qualified to perform high complexity clinical laboratory testing.    * >=100,000 COLONIES/mL ESCHERICHIA COLI  Gastrointestinal Panel by PCR , Stool     Status: Abnormal   Collection Time: 06/13/24 11:41 PM   Specimen: Stool  Result Value Ref Range Status   Campylobacter species NOT DETECTED NOT DETECTED Final   Plesimonas shigelloides NOT DETECTED NOT DETECTED Final   Salmonella species NOT DETECTED NOT DETECTED Final   Yersinia enterocolitica NOT DETECTED NOT DETECTED Final   Vibrio species NOT DETECTED NOT DETECTED Final   Vibrio cholerae NOT DETECTED NOT DETECTED Final   Enteroaggregative E coli (EAEC) NOT DETECTED NOT DETECTED Final   Enteropathogenic E coli (EPEC) DETECTED (A) NOT DETECTED Final    Comment: RESULT CALLED TO, READ BACK BY AND VERIFIED WITH:  MYRDA [facility] AT 0154 06/14/24 JG    Enterotoxigenic E coli (ETEC) NOT DETECTED NOT DETECTED Final   Shiga like toxin producing E coli (STEC) NOT DETECTED NOT DETECTED Final   Shigella/Enteroinvasive E coli (EIEC) NOT DETECTED NOT DETECTED Final   Cryptosporidium NOT DETECTED NOT DETECTED Final   Cyclospora cayetanensis NOT DETECTED NOT DETECTED Final   Entamoeba histolytica NOT DETECTED NOT DETECTED Final   Giardia lamblia NOT DETECTED NOT DETECTED Final   Adenovirus F40/41 NOT DETECTED NOT DETECTED Final   Astrovirus NOT DETECTED NOT  DETECTED Final   Norovirus GI/GII NOT DETECTED NOT DETECTED Final   Rotavirus A NOT DETECTED NOT DETECTED Final   Sapovirus (I, II, IV, and V) NOT DETECTED NOT DETECTED Final    Comment: Performed at [facility], [address], [place], [place] [address]  C Difficile Quick Screen w PCR reflex  Status: Abnormal   Collection Time: 06/13/24 11:41 PM   Specimen: Stool  Result Value Ref Range Status   C Diff antigen POSITIVE (A) NEGATIVE Final   C Diff toxin NEGATIVE NEGATIVE Final   C Diff interpretation Results are indeterminate. See PCR results.  Final    Comment: Performed at [facility], [address] [place], [place] [address]  C. Diff by PCR, Reflexed     Status: None   Collection Time: 06/13/24 11:41 PM  Result Value Ref Range Status   Toxigenic C. Difficile by PCR NEGATIVE NEGATIVE Final    Comment: Patient is colonized with non toxigenic C. difficile. May not need treatment unless significant symptoms are present.   Hypervirulent Strain PRESUMPTIVE NEGATIVE PRESUMPTIVE NEGATIVE Final    Comment: Performed at [facility], [address], [place], [place] [address]         Radiology Studies: No results found.      Scheduled Meds:  sodium chloride    Intravenous Once   atorvastatin   40 mg Oral QPM   calcitRIOL   0.25 mcg Oral Daily   cloNIDine   0.2 mg Oral BID   hydrocortisone   Rectal TID   labetalol   300 mg Oral BID   latanoprost   1 drop Both Eyes QHS   levothyroxine   75 mcg Oral Q0600   pantoprazole   40 mg Oral Daily   Continuous Infusions:   LOS: 6 days    Calvin KATHEE Robson, MD [facility]   If 7PM-7AM, please contact night-coverage  06/20/2024, 4:40 PM

## 2024-06-20 NOTE — TOC Progression Note (Addendum)
 Transition of Care [facility]) - Progression Note    Patient Details  Name: Betty Nielsen MRN: [id number] Date of Birth: 1946/05/07  Transition of [facility]) CM/SW Contact  Lauraine JAYSON Carpen, LCSW Phone Number: 06/20/2024, 10:41 AM  Clinical Narrative:   Tried calling son. Voicemail is full.  12:01 pm: Patient has not made decision yet. She will discuss with her son when he comes by.  3:38 pm: Patient will talk to son when he gets off work.  Expected Discharge Plan: Skilled Nursing Facility Barriers to Discharge: Continued Medical Work up               Expected Discharge Plan and Services     Post Acute Care Choice:  (TBD) Living arrangements for the past 2 months: Single Family Home                                       Social Drivers of Health (SDOH) Interventions SDOH Screenings   Food Insecurity: No Food Insecurity (06/14/2024)  Housing: Low Risk  (06/14/2024)  Transportation Needs: No Transportation Needs (06/14/2024)  Utilities: Not At Risk (06/14/2024)  Alcohol Screen: Low Risk  (09/01/2023)  Depression (PHQ2-9): Low Risk  (12/30/2023)  Financial Resource Strain: Low Risk  (09/01/2023)  Physical Activity: Inactive (09/01/2023)  Social Connections: Socially Isolated (06/14/2024)  Stress: No Stress Concern Present (09/01/2023)  Tobacco Use: Medium Risk (12/30/2023)  Health Literacy: Adequate Health Literacy (09/01/2023)    Readmission Risk Interventions     No data to display

## 2024-06-20 NOTE — Consult Note (Signed)
 Consultation Note Date: 06/20/2024 at 1230  Patient Name: Betty Nielsen  DOB: [birth year]  MRN: [id number]  Age / Sex: 78 y.o., female  PCP: [name], MD Referring Physician: Jhonny Calvin NOVAK, MD  HPI/Patient Profile: 78 y.o. female  with past medical history of CAD, hypertension, AAA s/p endovascular repair, lumbar spine stenosis, arthritis prior stroke, surgical hypothyroidism, and CKD-stage V admitted on 06/13/2024 with generalized weakness, increased somnolence, and N/V/D the day PTA.  Upon presentation to the ED, labs revealed worsening renal function with creatinine of 5.1 (up from most recent baseline of 3-4).  Additionally, hemoglobin was at baseline of 8.  Course of hospitalization included placement of rectal tube for diarrhea though C. difficile testing was negative multiple, treatment of enteropathogenic E. coli from blood cultures (Rocephin ), numerous blood transfusion for low hemoglobin, and slowly increasing creatinine.  Nephrology has been consulted.  As per chart review, patient has been cleared that she does not wish to pursue dialysis.  PMT was consulted to support patient and family with goals of care discussions.   Clinical Assessment and Goals of Care: Extensive chart review completed prior to meeting patient including labs, vital signs, imaging, progress notes, orders, and available advanced directive documents from current and previous encounters. I then met with patient at bedside with her son Rupert to discuss diagnosis prognosis, GOC, EOL wishes, disposition and options.  I introduced Palliative Medicine as specialized medical care for people living with serious illness. It focuses on providing relief from the symptoms and stress of a serious illness. The goal is to improve quality of life for both the patient and the family.  We discussed a brief life review of the patient.   Patient is minimally interactive during our discussions.  [name] shares that patient is the most stubborn woman you will ever meet.  He shares that the only person that can change her mind is Jesus.  We discussed patient's current illness and what it means in the larger context of patient's on-going co-morbidities.  Reviewed patient's creatinine trends from 2007 to present.  (1.3-1.27 from 2007 to 2021 with spike in 2022 as high as 5.67, 4.5 in October 2024, and present creatinine at 5.42).  Extensive discussion of function of kidney, CKD 5, ESRD, dialysis.  Pros and cons of dialysis reviewed with patient and son at bedside.  Patient appeared to fall asleep several times during our discussion.  Discussed importance of patient understanding of her disease process in order to make an educated decision on her next medical steps.  Pros and cons of utilizing dialysis as well as pros and cons of avoiding dialysis reviewed.  Quantity versus quality of life discussed.  I was frank and clear with patient that if she avoids dialysis she will likely return to the hospital with worsening complaints and that if she remains in kidney failure without treatment that this will ultimately lead to end-of-life earlier.  I attempted to elicit values and goals of care important to the patient.  Patient shares stories of her  sister and nephew who underwent dialysis and it did not help him at all.  In fact, patient shares thoughts and feelings that dialysis make their labs worse.  She shares she wants to avoid it for herself and alcohols.  I assured her she has full autonomy over her medical decision making.  However, she needs to clearly understand that avoiding dialysis versus excepting dialysis and the outcomes of these decisions.  I attempted to further discuss patient's batters of care such as CODE STATUS but she quickly changed the subject to ask about being moved back to bed and to have more p.o. intake at bedside.   Patient appears resistant to discussing goals/boundaries with me.  However, patient's son strongly encourages me to continue to meet with her.  Space and opportunity provided for patient and her son to share thoughts and emotions regarding patient's current medical situation.  [name] is encouraging his mother to except dialysis and asked that I return at a later date/time to further discuss dialysis with patient.  PMT contact info given to patient and son.  Encouraged family to call with any acute palliative needs.  The plan to follow-up with patient tomorrow to continue goals of care discussions.  No change to plan of care at this time.  Primary Decision Maker PATIENT  Physical Exam Vitals reviewed.  Constitutional:      General: She is not in acute distress.    Appearance: She is normal weight. She is not ill-appearing.  HENT:     Head: Normocephalic.     Right Ear: Decreased hearing noted.     Left Ear: Decreased hearing noted.  Cardiovascular:     Rate and Rhythm: Normal rate.  Pulmonary:     Effort: Pulmonary effort is normal.  Abdominal:     Palpations: Abdomen is soft.  Skin:    General: Skin is warm and dry.  Neurological:     Mental Status: She is alert and oriented to person, place, and time.  Psychiatric:        Mood and Affect: Mood normal.        Behavior: Behavior normal.     Palliative Assessment/Data: 60%     Thank you for this consult. Palliative medicine will continue to follow and assist holistically.   Visit includes: Detailed review of medical records (labs, imaging, vital signs), medically appropriate exam (mental status, respiratory, cardiac, skin), discussed with treatment team, counseling and educating patient, family and staff, documenting clinical information, medication management and coordination of care.  Signed by: Lamarr Gunner, DNP, FNP-BC Palliative Medicine   Please contact Palliative Medicine Team providers via [facility] for questions  and concerns.

## 2024-06-20 NOTE — Progress Notes (Signed)
 [facility]  ROUNDING NOTE   Subjective:   Betty Nielsen is a 78 year old female with past medical conditions including CAD, hypertension, stroke, and chronic kidney disease stage V.  Patient presents to the emergency department complaining of abdominal pain, diarrhea and weakness.  She has been admitted for Dehydration [E86.0] AKI (acute kidney injury) [N17.9] Altered mental status, unspecified altered mental status type [R41.82] Acute metabolic encephalopathy [G93.41] Diarrhea, unspecified type [R19.7]  Patient is known to our practice and is followed outpatient by Dr. Douglas.  Patient was last seen in office in May for routine follow-up.   Update Patient seen resting in bed Eyes closed but nodding yes or no to questioning  Creatinine 5.42  Objective:  Vital signs in last 24 hours:  Temp:  [98 F (36.7 C)-98.8 F (37.1 C)] 98.8 F (37.1 C) (10/01 1116) Pulse Rate:  [56-71] 62 (10/01 1116) Resp:  [16-19] 17 (10/01 1116) BP: (131-171)/(60-82) 151/74 (10/01 1116) SpO2:  [96 %-100 %] 99 % (10/01 1116)  Weight change:  Filed Weights   06/14/24 0814  Weight: 70.3 kg    Intake/Output: I/O last 3 completed shifts: In: 1527 [P.O.:640; I.V.:537; Blood:350] Out: -    Intake/Output this shift:  No intake/output data recorded.  Physical Exam: General: NAD  Head: Normocephalic, atraumatic. Moist oral mucosal membranes  Eyes: Anicteric  Lungs:  Clear to auscultation, normal effort  Heart: Regular rate and rhythm  Abdomen:  Soft, nontender  Extremities: No peripheral edema.  Neurologic: Awake, responding appropriately  Skin: Warm,dry, no rash  Access: None    Basic Metabolic Panel: Recent Labs  Lab 06/13/24 2021 06/14/24 0458 06/16/24 0350 06/17/24 0444 06/17/24 1030 06/18/24 0355 06/20/24 0447  NA 133*   < > 136 135 138 137 137  K 4.2   < > 3.4* 3.7 3.8 3.7 4.1  CL 104   < > 103 104 101 103 104  CO2 15*   < > 23 22 20* 21* 22  GLUCOSE 114*    < > 91 82 103* 64* 74  BUN 79*   < > 89* 89* 90* 89* 86*  CREATININE 5.18*   < > 5.26* 5.66* 5.50* 5.54* 5.42*  CALCIUM  8.6*   < > 7.7* 7.5* 7.9* 8.0* 8.0*  MG 4.1*  --   --   --   --   --   --    < > = values in this interval not displayed.    Liver Function Tests: Recent Labs  Lab 06/13/24 2021 06/14/24 0458  AST 26 23  ALT 10 8  ALKPHOS 57 49  BILITOT 0.9 0.7  PROT 6.0* 5.2*  ALBUMIN 3.1* 2.7*   No results for input(s): LIPASE, AMYLASE in the last 168 hours. No results for input(s): AMMONIA in the last 168 hours.  CBC: Recent Labs  Lab 06/16/24 0350 06/17/24 0444 06/17/24 1030 06/18/24 0355 06/18/24 2133 06/19/24 0317 06/19/24 1007 06/20/24 0447  WBC 11.6* 13.0* 11.8* 11.1*  --   --   --  9.9  HGB 6.3* 7.2* 7.2* 8.2* 7.4* 7.0* 7.1* 8.0*  HCT 19.0* 21.9* 21.3* 24.4*  --   --   --  24.6*  MCV 91.3 90.9 90.6 89.1  --   --   --  92.5  PLT 210 214 199 230  --   --   --  198    Cardiac Enzymes: Recent Labs  Lab 06/13/24 2021  CKTOTAL 69    BNP: Invalid input(s): POCBNP  CBG: Recent Labs  Lab 06/13/24 2053  GLUCAP 107*    Microbiology: Results for orders placed or performed during the hospital encounter of 06/13/24  Urine Culture (for pregnant, neutropenic or urologic patients or patients with an indwelling urinary catheter)     Status: Abnormal   Collection Time: 06/13/24  9:49 PM   Specimen: Urine, Catheterized  Result Value Ref Range Status   Specimen Description   Final    URINE, CATHETERIZED Performed at [facility], [address], [place], [place] [address]    Special Requests   Final    NONE Performed at [facility], [address] [place], [place] [address]    Culture >=100,000 COLONIES/mL ESCHERICHIA COLI (A)  Final   Report Status 06/15/2024 FINAL  Final   Organism ID, Bacteria ESCHERICHIA COLI (A)  Final      Susceptibility   Escherichia coli - MIC*    AMPICILLIN 4 SENSITIVE Sensitive     CEFAZOLIN   (URINE) Value in next row Sensitive      4 SENSITIVEThis is a modified FDA-approved test that has been validated and its performance characteristics determined by the reporting laboratory.  This laboratory is certified under the Clinical Laboratory Improvement Amendments CLIA as qualified to perform high complexity clinical laboratory testing.    CEFEPIME Value in next row Sensitive      4 SENSITIVEThis is a modified FDA-approved test that has been validated and its performance characteristics determined by the reporting laboratory.  This laboratory is certified under the Clinical Laboratory Improvement Amendments CLIA as qualified to perform high complexity clinical laboratory testing.    ERTAPENEM Value in next row Sensitive      4 SENSITIVEThis is a modified FDA-approved test that has been validated and its performance characteristics determined by the reporting laboratory.  This laboratory is certified under the Clinical Laboratory Improvement Amendments CLIA as qualified to perform high complexity clinical laboratory testing.    CEFTRIAXONE  Value in next row Sensitive      4 SENSITIVEThis is a modified FDA-approved test that has been validated and its performance characteristics determined by the reporting laboratory.  This laboratory is certified under the Clinical Laboratory Improvement Amendments CLIA as qualified to perform high complexity clinical laboratory testing.    CIPROFLOXACIN Value in next row Sensitive      4 SENSITIVEThis is a modified FDA-approved test that has been validated and its performance characteristics determined by the reporting laboratory.  This laboratory is certified under the Clinical Laboratory Improvement Amendments CLIA as qualified to perform high complexity clinical laboratory testing.    GENTAMICIN Value in next row Sensitive      4 SENSITIVEThis is a modified FDA-approved test that has been validated and its performance characteristics determined by the reporting  laboratory.  This laboratory is certified under the Clinical Laboratory Improvement Amendments CLIA as qualified to perform high complexity clinical laboratory testing.    NITROFURANTOIN Value in next row Sensitive      4 SENSITIVEThis is a modified FDA-approved test that has been validated and its performance characteristics determined by the reporting laboratory.  This laboratory is certified under the Clinical Laboratory Improvement Amendments CLIA as qualified to perform high complexity clinical laboratory testing.    TRIMETH/SULFA Value in next row Sensitive      4 SENSITIVEThis is a modified FDA-approved test that has been validated and its performance characteristics determined by the reporting laboratory.  This laboratory is certified under the Clinical Laboratory Improvement Amendments CLIA as qualified  to perform high complexity clinical laboratory testing.    AMPICILLIN/SULBACTAM Value in next row Sensitive      4 SENSITIVEThis is a modified FDA-approved test that has been validated and its performance characteristics determined by the reporting laboratory.  This laboratory is certified under the Clinical Laboratory Improvement Amendments CLIA as qualified to perform high complexity clinical laboratory testing.    PIP/TAZO Value in next row Sensitive      <=4 SENSITIVEThis is a modified FDA-approved test that has been validated and its performance characteristics determined by the reporting laboratory.  This laboratory is certified under the Clinical Laboratory Improvement Amendments CLIA as qualified to perform high complexity clinical laboratory testing.    MEROPENEM Value in next row Sensitive      <=4 SENSITIVEThis is a modified FDA-approved test that has been validated and its performance characteristics determined by the reporting laboratory.  This laboratory is certified under the Clinical Laboratory Improvement Amendments CLIA as qualified to perform high complexity clinical laboratory  testing.    * >=100,000 COLONIES/mL ESCHERICHIA COLI  Gastrointestinal Panel by PCR , Stool     Status: Abnormal   Collection Time: 06/13/24 11:41 PM   Specimen: Stool  Result Value Ref Range Status   Campylobacter species NOT DETECTED NOT DETECTED Final   Plesimonas shigelloides NOT DETECTED NOT DETECTED Final   Salmonella species NOT DETECTED NOT DETECTED Final   Yersinia enterocolitica NOT DETECTED NOT DETECTED Final   Vibrio species NOT DETECTED NOT DETECTED Final   Vibrio cholerae NOT DETECTED NOT DETECTED Final   Enteroaggregative E coli (EAEC) NOT DETECTED NOT DETECTED Final   Enteropathogenic E coli (EPEC) DETECTED (A) NOT DETECTED Final    Comment: RESULT CALLED TO, READ BACK BY AND VERIFIED WITH:  MYRDA [facility] AT 0154 06/14/24 JG    Enterotoxigenic E coli (ETEC) NOT DETECTED NOT DETECTED Final   Shiga like toxin producing E coli (STEC) NOT DETECTED NOT DETECTED Final   Shigella/Enteroinvasive E coli (EIEC) NOT DETECTED NOT DETECTED Final   Cryptosporidium NOT DETECTED NOT DETECTED Final   Cyclospora cayetanensis NOT DETECTED NOT DETECTED Final   Entamoeba histolytica NOT DETECTED NOT DETECTED Final   Giardia lamblia NOT DETECTED NOT DETECTED Final   Adenovirus F40/41 NOT DETECTED NOT DETECTED Final   Astrovirus NOT DETECTED NOT DETECTED Final   Norovirus GI/GII NOT DETECTED NOT DETECTED Final   Rotavirus A NOT DETECTED NOT DETECTED Final   Sapovirus (I, II, IV, and V) NOT DETECTED NOT DETECTED Final    Comment: Performed at [facility], [address] [place], [place] [address]  C Difficile Quick Screen w PCR reflex     Status: Abnormal   Collection Time: 06/13/24 11:41 PM   Specimen: Stool  Result Value Ref Range Status   C Diff antigen POSITIVE (A) NEGATIVE Final   C Diff toxin NEGATIVE NEGATIVE Final   C Diff interpretation Results are indeterminate. See PCR results.  Final    Comment: Performed at [facility], [address] [place],  [place] [address]  C. Diff by PCR, Reflexed     Status: None   Collection Time: 06/13/24 11:41 PM  Result Value Ref Range Status   Toxigenic C. Difficile by PCR NEGATIVE NEGATIVE Final    Comment: Patient is colonized with non toxigenic C. difficile. May not need treatment unless significant symptoms are present.   Hypervirulent Strain PRESUMPTIVE NEGATIVE PRESUMPTIVE NEGATIVE Final    Comment: Performed at [facility], [address] [place],  [place] [address]    Coagulation Studies: No results for input(s): LABPROT, INR in the last 72 hours.  Urinalysis: No results for input(s): COLORURINE, LABSPEC, PHURINE, GLUCOSEU, HGBUR, BILIRUBINUR, KETONESUR, PROTEINUR, UROBILINOGEN, NITRITE, LEUKOCYTESUR in the last 72 hours.  Invalid input(s): APPERANCEUR     Imaging: No results found.    Medications:      sodium chloride    Intravenous Once   atorvastatin   40 mg Oral QPM   calcitRIOL   0.25 mcg Oral Daily   cloNIDine   0.2 mg Oral BID   hydrocortisone   Rectal TID   labetalol   300 mg Oral BID   latanoprost   1 drop Both Eyes QHS   levothyroxine   75 mcg Oral Q0600   pantoprazole   40 mg Oral Daily   acetaminophen  **OR** acetaminophen , hydrALAZINE , HYDROcodone -acetaminophen , ondansetron  **OR** ondansetron  (ZOFRAN ) IV, simethicone   Assessment/ Plan:  [name] is a 78 y.o.  female with past medical conditions including CAD, hypertension, stroke, and chronic kidney disease stage V.  Patient presents to the emergency department complaining of abdominal pain, diarrhea and weakness.  She has been admitted for Dehydration [E86.0] AKI (acute kidney injury) [N17.9] Altered mental status, unspecified altered mental status type [R41.82] Acute metabolic encephalopathy [G93.41] Diarrhea, unspecified type [R19.7]   Acute Kidney Injury on chronic kidney disease stage V with baseline creatinine 4.27 and GFR of 10 on last office visit on 01/31/2024.  Acute  kidney injury secondary to dehydration from GI losses No recent IV contrast exposure.  Creatinine on ED arrival 5.18.  Patient has been adamant during previous admissions and in office she would not be interested in renal replacement therapy.    Creatinine stable at this time.  Patient encouraged to maintain oral intake.  Lab Results  Component Value Date   CREATININE 5.42 (H) 06/20/2024   CREATININE 5.54 (H) 06/18/2024   CREATININE 5.50 (H) 06/17/2024    Intake/Output Summary (Last 24 hours) at 06/20/2024 1322 Last data filed at 06/19/2024 1940 Gross per 24 hour  Intake 560 ml  Output --  Net 560 ml   2.  Acute metabolic acidosis, serum bicarb 15 on ED arrival.  S bicarb corrected to 22  3. Anemia of chronic kidney disease Lab Results  Component Value Date   HGB 8.0 (L) 06/20/2024  Patient received blood transfusion on 9/28. Hgb slowly improving.  4.  Hypertension with chronic kidney disease.  Home regimen includes clonidine  and labetalol , both have been restarted. Blood pressure 151/74, acceptable for this patient.    LOS: 6 Camilo Mander 10/1/20251:22 PM

## 2024-06-21 DIAGNOSIS — N179 Acute kidney failure, unspecified: Secondary | ICD-10-CM | POA: Diagnosis not present

## 2024-06-21 DIAGNOSIS — R4182 Altered mental status, unspecified: Secondary | ICD-10-CM | POA: Diagnosis not present

## 2024-06-21 DIAGNOSIS — A09 Infectious gastroenteritis and colitis, unspecified: Secondary | ICD-10-CM | POA: Diagnosis not present

## 2024-06-21 DIAGNOSIS — E860 Dehydration: Secondary | ICD-10-CM | POA: Diagnosis not present

## 2024-06-21 DIAGNOSIS — Z515 Encounter for palliative care: Secondary | ICD-10-CM | POA: Diagnosis not present

## 2024-06-21 LAB — CBC WITH DIFFERENTIAL/PLATELET
Abs Immature Granulocytes: 0.28 K/uL — ABNORMAL HIGH (ref 0.00–0.07)
Basophils Absolute: 0 K/uL (ref 0.0–0.1)
Basophils Relative: 0 %
Eosinophils Absolute: 0.8 K/uL — ABNORMAL HIGH (ref 0.0–0.5)
Eosinophils Relative: 8 %
HCT: 25.5 % — ABNORMAL LOW (ref 36.0–46.0)
Hemoglobin: 8.3 g/dL — ABNORMAL LOW (ref 12.0–15.0)
Immature Granulocytes: 3 %
Lymphocytes Relative: 14 %
Lymphs Abs: 1.4 K/uL (ref 0.7–4.0)
MCH: 30.2 pg (ref 26.0–34.0)
MCHC: 32.5 g/dL (ref 30.0–36.0)
MCV: 92.7 fL (ref 80.0–100.0)
Monocytes Absolute: 0.8 K/uL (ref 0.1–1.0)
Monocytes Relative: 8 %
Neutro Abs: 6.5 K/uL (ref 1.7–7.7)
Neutrophils Relative %: 67 %
Platelets: 201 K/uL (ref 150–400)
RBC: 2.75 MIL/uL — ABNORMAL LOW (ref 3.87–5.11)
RDW: 14.9 % (ref 11.5–15.5)
WBC: 9.8 K/uL (ref 4.0–10.5)
nRBC: 0 % (ref 0.0–0.2)

## 2024-06-21 LAB — BASIC METABOLIC PANEL WITH GFR
Anion gap: 9 (ref 5–15)
BUN: 82 mg/dL — ABNORMAL HIGH (ref 8–23)
CO2: 20 mmol/L — ABNORMAL LOW (ref 22–32)
Calcium: 8.2 mg/dL — ABNORMAL LOW (ref 8.9–10.3)
Chloride: 105 mmol/L (ref 98–111)
Creatinine, Ser: 5.68 mg/dL — ABNORMAL HIGH (ref 0.44–1.00)
GFR, Estimated: 7 mL/min — ABNORMAL LOW (ref >60)
Glucose, Bld: 91 mg/dL (ref 70–99)
Potassium: 3.9 mmol/L (ref 3.5–5.1)
Sodium: 134 mmol/L — ABNORMAL LOW (ref 135–145)

## 2024-06-21 LAB — PREPARE RBC (CROSSMATCH)

## 2024-06-21 MED ORDER — CLOPIDOGREL BISULFATE 75 MG PO TABS
75.0000 mg | ORAL_TABLET | Freq: Every day | ORAL | Status: DC
Start: 2024-06-21 — End: 2024-06-22
  Administered 2024-06-21 – 2024-06-22 (×2): 75 mg via ORAL
  Filled 2024-06-21 (×2): qty 1

## 2024-06-21 MED ORDER — ASPIRIN 81 MG PO TBEC
81.0000 mg | DELAYED_RELEASE_TABLET | Freq: Every day | ORAL | Status: DC
  Administered 2024-06-21 – 2024-06-22 (×2): 81 mg via ORAL
  Filled 2024-06-21 (×2): qty 1

## 2024-06-21 NOTE — Progress Notes (Signed)
 Palliative Care Progress Note, Assessment & Plan   Patient Name: Betty Nielsen       Date: 06/21/2024 DOB: Jan 16, 1946  Age: 78 y.o. MRN#: [id number] Attending Physician: Jhonny Calvin NOVAK, MD Primary Care Physician: Sowles, Krichna, MD Admit Date: 06/13/2024  Subjective: Patient is sitting up in bed in no apparent distress.  She is hard of hearing, she acknowledges my presence and is able to make her wishes known.  No family or friends are present at bedside during my visit.  HPI: 78 y.o. female  with past medical history of CAD, hypertension, AAA s/p endovascular repair, lumbar spine stenosis, arthritis prior stroke, surgical hypothyroidism, and CKD-stage V admitted on 06/13/2024 with generalized weakness, increased somnolence, and N/V/D the day PTA.   Upon presentation to the ED, labs revealed worsening renal function with creatinine of 5.1 (up from most recent baseline of 3-4).  Additionally, hemoglobin was at baseline of 8.   Course of hospitalization included placement of rectal tube for diarrhea though C. difficile testing was negative multiple, treatment of enteropathogenic E. coli from blood cultures (Rocephin ), numerous blood transfusion for low hemoglobin, and slowly increasing creatinine.   Nephrology has been consulted.   As per chart review, patient has been cleared that she does not wish to pursue dialysis.   PMT was consulted to support patient and family with goals of care discussions.   Summary of counseling/coordination of care: Extensive chart review completed prior to meeting patient including labs, vital signs, imaging, progress notes, orders, and available advanced directive documents from current and previous encounters.   After reviewing the patient's chart and assessing the  patient at bedside, I spoke with patient in regards to symptom management and goals of care.   I attempted to gauge patient's understanding of her current medical situation.  She shares she is never going to do dialysis.  She shares she is seeing several family members go for dialysis and that she does not want to have what happened today attested to her.  I attempted to differentiate patient's current medical situation for those of her family members given they likely had different comorbidities and past medical histories.  However, patient continued to repeat that she would never be accepting of hemodialysis.  In light of patient being clear that she does not want to accept hemodialysis, I attempted to have patient share for the outcome medically would be for her to continue without dialysis.  Patient shares she knows she is going to die sometime.  I shared that with a creatinine of 5.3, patient is likely facing end-of-life within the next 6 months or less.  Patient shares that I do not know that for affect.  Space and opportunity provided for patient to share her thoughts and emotions regarding her current medical situation.  She continues to say she did not want hemodialysis.  Despite trying to redirect patient to focus on what the next best steps would be for her and avoiding hemodialysis, she was not engaging in those discussions.  I attempted to discuss patient's CODE STATUS.  Full CODE STATUS and DNR limited interventions reviewed.  Patient shares that her son has the answer to all of these things and [place]  speak with him.  However, son has deferred all decision making to the patient.  Patient attempted to speak with son over the phone who shared my sentiments that patient has complete agency and autonomy over her decision making.  Patient became mildly agitated, saying she did not want to get angry.  Therapeutic silence and active listening provided.  Patient shares that she would never want to be  kept alive by machines.  Additionally, she shares that when the Jacquetta comes to take care of she wants to go in peace.  I shared that this view is in line with DNR with limited interventions.  However, patient says that I need to speak with her son in regards to CODE STATUS before anything gets changed.  Despite patient being resistant to continuing discussion, I made it clear that the patient does not wish to continue with hemodialysis, my recommendation be to go home with hospice services.  Patient shares she does not want to go to rehab because she does not see the point.  I shared understanding and recommended that if patient is not going to pursue hemodialysis then she likely will not be successful at rehabbing.  She shares she would like to return home but she did not enact her hospice services yet.  She shares she wants to get home to see how she does and can call hospice whenever she needs to.  Hospice philosophy, hospice services in the home, and aging in place reviewed.  PT came to bedside to work with patient and she was eager to get to the chair.  Patient had no further questions or concerns.  Patient was agreeable for PMT to continue to follow and support.  I spoke with patient's son over the phone.  He asked about prognosis.  I shared what I had shared with the patient that if she does not except hemodialysis then she is likely appropriate for hospice services, meaning she could die in the next 6 months or less.  However, this is not a hard and fast rule.  He shares appreciation for the timeframe given.  He shares that he does not want to make any decisions for her since she will blame him for things going poorly either way.  I shared the patient has made her wishes known well he may not agree with them and is admirable that he is supporting them.  He shares he plans to visit with patient tonight and will clarify her CODE STATUS as well.  PMT will continue to follow and support.  Above  discussion conveyed to attending.  Plan is to restart Eliquis, monitor for bleeding, and if stable patient to be discharged home.  Physical Exam Vitals reviewed.  Constitutional:      General: She is not in acute distress. HENT:     Head: Normocephalic.     Mouth/Throat:     Mouth: Mucous membranes are moist.  Eyes:     Pupils: Pupils are equal, round, and reactive to light.  Cardiovascular:     Rate and Rhythm: Normal rate.     Heart sounds: Normal heart sounds.  Pulmonary:     Effort: Pulmonary effort is normal.  Abdominal:     Palpations: Abdomen is soft.  Skin:    General: Skin is warm and dry.  Neurological:     Mental Status: She is alert and oriented to person, place, and time.  Psychiatric:        Mood and Affect: Mood normal.  Behavior: Behavior normal.             Visit includes: Detailed review of medical records (labs, imaging, vital signs), medically appropriate exam (mental status, respiratory, cardiac, skin), discussed with treatment team, counseling and educating patient, family and staff, documenting clinical information, medication management and coordination of care.  Lamarr L. Arvid, DNP, FNP-BC Palliative Medicine Team

## 2024-06-21 NOTE — Progress Notes (Signed)
 PROGRESS NOTE    Betty Nielsen  FMW:[id number] DOB: Jun 21, 1946 DOA: 06/13/2024 PCP: Sowles, Krichna, MD    Brief Narrative:  78 y.o. female with medical history significant for HTN, AAA s/p endovascular repair, prior stroke, CAD surgical hypothyroidism, CKD 5 who has declined dialysis planning referred by her nephrologist, hypothyroidism, anemia of chronic disease, lumbar spinal stenosis, being admitted with acute encephalopathy  in the setting of acute diarrhea.  She was brought in by her son with generalized weakness, increased somnolence and nausea vomiting and diarrhea starting earlier in the day.  Patient is very somnolent and unable to contribute to the history.    9/25.  Patient having a lot of diarrhea and rectal tube was placed.  Started empirically on vancomycin  even though C. difficile testing was negative.  Enteropathogenic E. coli growing out of culture.  Started on Rocephin  for 3 days. 9/26.  Patient declined blood transfusion.  Had rectal tube in on the morning but came out in the afternoon.  Physical therapy recommending rehab 9/27.  Hemoglobin down to 6.3.  Agreeable to blood transfusion.  Creatinine has not improved.  Has declined dialysis. 9/28.  Agreeable to another unit of blood with hemoglobin 7.2. 9/29.  Hemoglobin 8.2 today.  Creatinine still impaired at 5.54.  Spoke with nephrology and will restart gentle fluid. 9/30.  Had rectal bleeding overnight.  Holding aspirin , Plavix  and heparin  subcutaneous injection.  Hemoglobin 7.0.  Will give 1 unit of packed red blood cells today 10/1: Clinical status overall unchanged.  Palliative care consult requested 10/2: Patient continues to vehemently refused dialysis.  Also refuses admission to hospice and skilled nursing facility and home health services.  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 coli.  Was empirically placed on p.o. vancomycin  though there is no indication.  C. difficile antigen positive but toxin and confirmatory PCR have been negative.  Vancomycin  discontinued   Lower GI bleed Holding aspirin  and Plavix  (Last dose on 9/29).  Transfuse 1 unit of packed red blood cells today secondary to hemoglobin of 7.0.  Hemoglobin is stabilized.  Cautiously resume DAPT aspirin  and Plavix  for now.  Monitor hemoglobin closely.   Acute renal failure superimposed on stage 4 chronic kidney disease (HCC) Secondary to GI losses, baseline worsening of CKD creatinine 5.54 with a GFR of 7.  Nephrology following.  Patient has refused dialysis in the past.  Palliative care consultation appreciated.  Patient continues to refuse dialysis.  At this time she is also refusing hospice services, skilled nursing facility, and home health services.  As such we will likely discharge home in the next 24 to 48 hours.   Acute metabolic encephalopathy Status waxes and wanes.  Patient seems withdrawn with poor participation in interview   Acute metabolic acidosis Secondary to worsening creatinine.     Hypothyroidism, postsurgical Continue levothyroxine    Anemia of chronic disease Transfused total of 3 units  packed red cells during admission.   DAPT resumed 10/2  Acute cystitis with hematuria E. coli growing out of urine culture.  Completed Rocephin  for 3 days.   AAA s/p endovascular repair (abdominal aortic aneurysm)  without rupture No acute issues suspected   Overweight (BMI 25.0-29.9) BMI 27.46   Coronary artery disease Antiplatelet agents restarted 10/2.  Continue statin   Essential hypertension On clonidine  and labetalol    History of CVA (cerebrovascular accident) Resume aspirin  and Plavix .  Stroke risk higher holding these medications.  On atorvastatin     DVT prophylaxis: SCDs Code Status: Full Family Communication: None today.  Palliative care discussed with family Disposition Plan: Status is: Inpatient Remains inpatient appropriate because: Multiple acute issues as above   Level of care: Progressive  Consultants:  Nephrology Palliative care  Procedures:  None  Antimicrobials: None   Subjective: Seen and examined.  Poor participation in interview.  Feels well overall.  Feels like the medical team is forcing her into a decision  Objective: Vitals:   06/21/24 0501 06/21/24 0732 06/21/24 1100 06/21/24 1208  BP: (!) 168/76 (!) 180/79 (!) 175/75 138/61  Pulse:  (!) 55  (!) 59  Resp: 15 18  17   Temp: 98.4 F (36.9 C) 98.6 F (37 C)  98.1 F (36.7 C)  TempSrc: Oral     SpO2: 99% 99%  99%  Weight:        Intake/Output Summary (Last 24 hours) at 06/21/2024 1459 Last data filed at 06/20/2024 1937 Gross per 24 hour  Intake 0 ml  Output --  Net 0 ml   Filed Weights   06/14/24 0814  Weight: 70.3 kg    Examination:  General exam: Withdrawn.  Chronically ill-appearing Respiratory system: Clear to auscultation. Respiratory effort normal. Cardiovascular system: S1-S2, RRR, no murmurs, no pedal edema Gastrointestinal system: Soft, NT/ND, normal bowel sounds Central nervous system: Alert.  Oriented x 2. Extremities: Symmetric 5 x 5 power. Skin: No rashes, lesions or ulcers Psychiatry: Judgement and insight appear impaired. Mood & affect flattened.     Data Reviewed: I have personally reviewed following labs and imaging studies  CBC: Recent Labs  Lab  06/17/24 0444 06/17/24 1030 06/18/24 0355 06/18/24 2133 06/19/24 0317 06/19/24 1007 06/20/24 0447 06/21/24 1104  WBC 13.0* 11.8* 11.1*  --   --   --  9.9 9.8  NEUTROABS  --   --   --   --   --   --   --  6.5  HGB 7.2* 7.2* 8.2* 7.4* 7.0* 7.1* 8.0* 8.3*  HCT 21.9* 21.3* 24.4*  --   --   --  24.6* 25.5*  MCV 90.9 90.6 89.1  --   --   --  92.5 92.7  PLT 214 199 230  --   --   --  198 201   Basic Metabolic Panel: Recent Labs  Lab 06/17/24 0444 06/17/24 1030 06/18/24 0355 06/20/24 0447 06/21/24 1104  NA 135 138 137 137 134*  K 3.7 3.8 3.7 4.1 3.9  CL 104 101 103 104 105  CO2 22 20* 21* 22 20*  GLUCOSE 82 103* 64* 74 91  BUN 89* 90* 89* 86* 82*  CREATININE 5.66* 5.50* 5.54* 5.42* 5.68*  CALCIUM  7.5* 7.9* 8.0* 8.0* 8.2*   GFR: Estimated Creatinine Clearance: 7.7 mL/min (A) (by C-G formula based on SCr of 5.68 mg/dL (H)). Liver Function Tests: No results for input(s): AST, ALT, ALKPHOS, BILITOT, PROT, ALBUMIN in the last 168 hours.  No results  for input(s): LIPASE, AMYLASE in the last 168 hours. No results for input(s): AMMONIA in the last 168 hours. Coagulation Profile: No results for input(s): INR, PROTIME in the last 168 hours. Cardiac Enzymes: No results for input(s): CKTOTAL, CKMB, CKMBINDEX, TROPONINI in the last 168 hours.  BNP (last 3 results) No results for input(s): PROBNP in the last 8760 hours. HbA1C: No results for input(s): HGBA1C in the last 72 hours. CBG: No results for input(s): GLUCAP in the last 168 hours.  Lipid Profile: No results for input(s): CHOL, HDL, LDLCALC, TRIG, CHOLHDL, LDLDIRECT in the last 72 hours. Thyroid  Function Tests: No results for input(s): TSH, T4TOTAL, FREET4, T3FREE, THYROIDAB in the last 72 hours. Anemia Panel: No results for input(s): VITAMINB12, FOLATE, FERRITIN, TIBC, IRON , RETICCTPCT in the last 72 hours. Sepsis Labs: No results for input(s):  PROCALCITON, LATICACIDVEN in the last 168 hours.  Recent Results (from the past 240 hours)  Urine Culture (for pregnant, neutropenic or urologic patients or patients with an indwelling urinary catheter)     Status: Abnormal   Collection Time: 06/13/24  9:49 PM   Specimen: Urine, Catheterized  Result Value Ref Range Status   Specimen Description   Final    URINE, CATHETERIZED Performed at [facility], [address], [place], [place] [address]    Special Requests   Final    NONE Performed at [facility], [address] [place], [place] [address]    Culture >=100,000 COLONIES/mL ESCHERICHIA COLI (A)  Final   Report Status 06/15/2024 FINAL  Final   Organism ID, Bacteria ESCHERICHIA COLI (A)  Final      Susceptibility   Escherichia coli - MIC*    AMPICILLIN 4 SENSITIVE Sensitive     CEFAZOLIN  (URINE) Value in next row Sensitive      4 SENSITIVEThis is a modified FDA-approved test that has been validated and its performance characteristics determined by the reporting laboratory.  This laboratory is certified under the Clinical Laboratory Improvement Amendments CLIA as qualified to perform high complexity clinical laboratory testing.    CEFEPIME Value in next row Sensitive      4 SENSITIVEThis is a modified FDA-approved test that has been validated and its performance characteristics determined by the reporting laboratory.  This laboratory is certified under the Clinical Laboratory Improvement Amendments CLIA as qualified to perform high complexity clinical laboratory testing.    ERTAPENEM Value in next row Sensitive      4 SENSITIVEThis is a modified FDA-approved test that has been validated and its performance characteristics determined by the reporting laboratory.  This laboratory is certified under the Clinical Laboratory Improvement Amendments CLIA as qualified to perform high complexity clinical laboratory testing.    CEFTRIAXONE  Value in next row Sensitive       4 SENSITIVEThis is a modified FDA-approved test that has been validated and its performance characteristics determined by the reporting laboratory.  This laboratory is certified under the Clinical Laboratory Improvement Amendments CLIA as qualified to perform high complexity clinical laboratory testing.    CIPROFLOXACIN Value in next row Sensitive      4 SENSITIVEThis is a modified FDA-approved test that has been validated and its performance characteristics determined by the reporting laboratory.  This laboratory is certified under the Clinical Laboratory Improvement Amendments CLIA as qualified to perform high complexity clinical laboratory testing.    GENTAMICIN Value in next row Sensitive      4 SENSITIVEThis is a modified FDA-approved test that has been validated and its  performance characteristics determined by the reporting laboratory.  This laboratory is certified under the Clinical Laboratory Improvement Amendments CLIA as qualified to perform high complexity clinical laboratory testing.    NITROFURANTOIN Value in next row Sensitive      4 SENSITIVEThis is a modified FDA-approved test that has been validated and its performance characteristics determined by the reporting laboratory.  This laboratory is certified under the Clinical Laboratory Improvement Amendments CLIA as qualified to perform high complexity clinical laboratory testing.    TRIMETH/SULFA Value in next row Sensitive      4 SENSITIVEThis is a modified FDA-approved test that has been validated and its performance characteristics determined by the reporting laboratory.  This laboratory is certified under the Clinical Laboratory Improvement Amendments CLIA as qualified to perform high complexity clinical laboratory testing.    AMPICILLIN/SULBACTAM Value in next row Sensitive      4 SENSITIVEThis is a modified FDA-approved test that has been validated and its performance characteristics determined by the reporting laboratory.  This  laboratory is certified under the Clinical Laboratory Improvement Amendments CLIA as qualified to perform high complexity clinical laboratory testing.    PIP/TAZO Value in next row Sensitive      <=4 SENSITIVEThis is a modified FDA-approved test that has been validated and its performance characteristics determined by the reporting laboratory.  This laboratory is certified under the Clinical Laboratory Improvement Amendments CLIA as qualified to perform high complexity clinical laboratory testing.    MEROPENEM Value in next row Sensitive      <=4 SENSITIVEThis is a modified FDA-approved test that has been validated and its performance characteristics determined by the reporting laboratory.  This laboratory is certified under the Clinical Laboratory Improvement Amendments CLIA as qualified to perform high complexity clinical laboratory testing.    * >=100,000 COLONIES/mL ESCHERICHIA COLI  Gastrointestinal Panel by PCR , Stool     Status: Abnormal   Collection Time: 06/13/24 11:41 PM   Specimen: Stool  Result Value Ref Range Status   Campylobacter species NOT DETECTED NOT DETECTED Final   Plesimonas shigelloides NOT DETECTED NOT DETECTED Final   Salmonella species NOT DETECTED NOT DETECTED Final   Yersinia enterocolitica NOT DETECTED NOT DETECTED Final   Vibrio species NOT DETECTED NOT DETECTED Final   Vibrio cholerae NOT DETECTED NOT DETECTED Final   Enteroaggregative E coli (EAEC) NOT DETECTED NOT DETECTED Final   Enteropathogenic E coli (EPEC) DETECTED (A) NOT DETECTED Final    Comment: RESULT CALLED TO, READ BACK BY AND VERIFIED WITH:  MYRDA [facility] AT 0154 06/14/24 JG    Enterotoxigenic E coli (ETEC) NOT DETECTED NOT DETECTED Final   Shiga like toxin producing E coli (STEC) NOT DETECTED NOT DETECTED Final   Shigella/Enteroinvasive E coli (EIEC) NOT DETECTED NOT DETECTED Final   Cryptosporidium NOT DETECTED NOT DETECTED Final   Cyclospora cayetanensis NOT DETECTED NOT DETECTED Final    Entamoeba histolytica NOT DETECTED NOT DETECTED Final   Giardia lamblia NOT DETECTED NOT DETECTED Final   Adenovirus F40/41 NOT DETECTED NOT DETECTED Final   Astrovirus NOT DETECTED NOT DETECTED Final   Norovirus GI/GII NOT DETECTED NOT DETECTED Final   Rotavirus A NOT DETECTED NOT DETECTED Final   Sapovirus (I, II, IV, and V) NOT DETECTED NOT DETECTED Final    Comment: Performed at [facility], [address], [place], [place] [address]  C Difficile Quick Screen w PCR reflex     Status: Abnormal   Collection Time: 06/13/24 11:41 PM   Specimen: Stool  Result Value Ref Range Status   C Diff antigen POSITIVE (A) NEGATIVE Final   C Diff toxin NEGATIVE NEGATIVE Final   C Diff interpretation Results are indeterminate. See PCR results.  Final    Comment: Performed at [facility], [address] [place], [place] [address]  C. Diff by PCR, Reflexed     Status: None   Collection Time: 06/13/24 11:41 PM  Result Value Ref Range Status   Toxigenic C. Difficile by PCR NEGATIVE NEGATIVE Final    Comment: Patient is colonized with non toxigenic C. difficile. May not need treatment unless significant symptoms are present.   Hypervirulent Strain PRESUMPTIVE NEGATIVE PRESUMPTIVE NEGATIVE Final    Comment: Performed at [facility], [address], [place], [place] [address]         Radiology Studies: No results found.      Scheduled Meds:  sodium chloride    Intravenous Once   aspirin  EC  81 mg Oral Daily   atorvastatin   40 mg Oral QPM   calcitRIOL   0.25 mcg Oral Daily   cloNIDine   0.2 mg Oral BID   clopidogrel   75 mg Oral Daily   hydrocortisone   Rectal TID   labetalol   300 mg Oral BID   latanoprost   1 drop Both Eyes QHS   levothyroxine   75 mcg Oral Q0600   pantoprazole   40 mg Oral Daily   Continuous Infusions:   LOS: 7 days    Calvin KATHEE Robson, MD [facility]   If 7PM-7AM, please contact night-coverage  06/21/2024, 2:59 PM

## 2024-06-21 NOTE — Progress Notes (Signed)
 Physical Therapy Treatment Patient Details Name: MARLISE FAHR MRN: [id number] DOB: 1946/05/24 Today's Date: 06/21/2024   History of Present Illness presented to ER secondary to generalized weakness, nausea/vomiting, diarrhea; admitted for mangement of acute metabolic encephalopathy, acute infectious gastroenteritis.    PT Comments  Pt received in bed, Palliative at bedside discussing pt's wishes to decline Dialysis, STR, and home Hospice services at this time. Discussed at length with pt regarding her home set up to help facilitate her wishes. Today she was able to demonstrate bed mobility and transfers with Supervison. Great tolerance for gait training around nursing station with support of RW (pt previously used a SPC) Definite B UE support needed during gait due to weakness from ongoing loose stools and prolonged hospital stay. Will continue to assess need for RW vs Rollator as well as other needs for safe d/c. Pt would benefit from continued PT services as initially recommended at d/c if willing.    If plan is discharge home, recommend the following: A little help with walking and/or transfers;A little help with bathing/dressing/bathroom   Can travel by private vehicle     Yes  Equipment Recommendations  Other (comment) (STR vs HHPT will decide on RW vs Rollator)    Recommendations for Other Services       Precautions / Restrictions Precautions Precautions: Fall Recall of Precautions/Restrictions: Intact Restrictions Weight Bearing Restrictions Per Provider Order: No     Mobility  Bed Mobility Overal bed mobility: Needs Assistance Bed Mobility: Supine to Sit     Supine to sit: Supervision, HOB elevated     General bed mobility comments: No physical assist to help pt to edge of bed    Transfers Overall transfer level: Needs assistance Equipment used: Rolling walker (2 wheels) Transfers: Sit to/from Stand Sit to Stand: Supervision           General transfer  comment: Pt able to stand from regular height bed on first attempt    Ambulation/Gait Ambulation/Gait assistance: Contact guard assist, Supervision Gait Distance (Feet):  (160) Assistive device: Rolling walker (2 wheels) Gait Pattern/deviations: Step-through pattern, Trunk flexed Gait velocity: decr     General Gait Details:  (Slow, steady gait with RW, may attempt Rollator next visit)   Stairs             Wheelchair Mobility     Tilt Bed    Modified Rankin (Stroke Patients Only)       Balance Overall balance assessment: Needs assistance Sitting-balance support: Feet supported, No upper extremity supported Sitting balance-Leahy Scale: Good     Standing balance support: Bilateral upper extremity supported, During functional activity, Reliant on assistive device for balance Standing balance-Leahy Scale: Fair Standing balance comment:  (Safer mobility long distances with RW)                            Communication Communication Communication: Impaired Factors Affecting Communication: Hearing impaired  Cognition Arousal: Alert Behavior During Therapy: WFL for tasks assessed/performed   PT - Cognitive impairments: No apparent impairments                       PT - Cognition Comments:  (Pt pleasant and cooperative, very HOH) Following commands: Intact      Cueing Cueing Techniques: Verbal cues, Tactile cues  Exercises General Exercises - Lower Extremity Ankle Circles/Pumps: AROM, Both, 10 reps, Seated Long Arc Quad: AROM, Both, 10 reps, Seated Other Exercises  Other Exercises:  (Pt educated on benefits of STR, yet pt is refusing and wants HHPT services. Discussed home set up at length to help prepare for pt's wishes)    General Comments General comments (skin integrity, edema, etc.): Continued loose stool, very HOH, has decided not to pursue Dialysis and will hold off on Home Hospice for now      Pertinent Vitals/Pain Pain  Assessment Pain Assessment: No/denies pain    Home Living                          Prior Function            PT Goals (current goals can now be found in the care plan section) Acute Rehab PT Goals Patient Stated Goal: to return home Progress towards PT goals: Progressing toward goals    Frequency    Min 2X/week      PT Plan      Co-evaluation              AM-PAC PT 6 Clicks Mobility   Outcome Measure  Help needed turning from your back to your side while in a flat bed without using bedrails?: None Help needed moving from lying on your back to sitting on the side of a flat bed without using bedrails?: A Little Help needed moving to and from a bed to a chair (including a wheelchair)?: A Little Help needed standing up from a chair using your arms (e.g., wheelchair or bedside chair)?: A Little Help needed to walk in hospital room?: A Little Help needed climbing 3-5 steps with a railing? : A Lot 6 Click Score: 18    End of Session Equipment Utilized During Treatment: Gait belt Activity Tolerance: Patient tolerated treatment well Patient left: in chair;with call bell/phone within reach Nurse Communication: Mobility status PT Visit Diagnosis: Muscle weakness (generalized) (M62.81);Difficulty in walking, not elsewhere classified (R26.2)     Time: 8678-8652 PT Time Calculation (min) (ACUTE ONLY): 26 min  Charges:    $Gait Training: 8-22 mins $Therapeutic Activity: 8-22 mins PT General Charges $$ ACUTE PT VISIT: 1 Visit                    Darice Bohr, PTA  Darice JAYSON Bohr 06/21/2024, 4:04 PM

## 2024-06-21 NOTE — TOC Progression Note (Signed)
 Transition of Care [facility]) - Progression Note    Patient Details  Name: Betty Nielsen MRN: [id number] Date of Birth: 07-10-1946  [facility] CM/SW Contact  Lauraine JAYSON Carpen, LCSW Phone Number: 06/21/2024, 2:30 PM  Clinical Narrative:  Per PT, patient walked around the unit today. Patient declined SNF and home health. CSW encouraged her to notify the team if she changes her mind prior to discharge and to contact her PCP if she changes her mind about home health after discharge. Her son will transport her home at discharge.   Expected Discharge Plan: Skilled Nursing Facility Barriers to Discharge: Continued Medical Work up               Expected Discharge Plan and Services     Post Acute Care Choice:  (TBD) Living arrangements for the past 2 months: Single Family Home                                       Social Drivers of Health (SDOH) Interventions SDOH Screenings   Food Insecurity: No Food Insecurity (06/14/2024)  Housing: Low Risk  (06/14/2024)  Transportation Needs: No Transportation Needs (06/14/2024)  Utilities: Not At Risk (06/14/2024)  Alcohol Screen: Low Risk  (09/01/2023)  Depression (PHQ2-9): Low Risk  (12/30/2023)  Financial Resource Strain: Low Risk  (09/01/2023)  Physical Activity: Inactive (09/01/2023)  Social Connections: Socially Isolated (06/14/2024)  Stress: No Stress Concern Present (09/01/2023)  Tobacco Use: Medium Risk (12/30/2023)  Health Literacy: Adequate Health Literacy (09/01/2023)    Readmission Risk Interventions     No data to display

## 2024-06-21 NOTE — Plan of Care (Signed)
  Problem: Education: Goal: Knowledge of General Education information will improve Description: Including pain rating scale, medication(s)/side effects and non-pharmacologic comfort measures Outcome: Progressing   Problem: Clinical Measurements: Goal: Respiratory complications will improve Outcome: Progressing   Problem: Activity: Goal: Risk for activity intolerance will decrease Outcome: Progressing   Problem: Pain Managment: Goal: General experience of comfort will improve and/or be controlled Outcome: Progressing

## 2024-06-21 NOTE — Progress Notes (Signed)
 [facility]  ROUNDING NOTE   Subjective:   Betty Nielsen is a 78 year old female with past medical conditions including CAD, hypertension, stroke, and chronic kidney disease stage V.  Patient presents to the emergency department complaining of abdominal pain, diarrhea and weakness.  She has been admitted for Dehydration [E86.0] AKI (acute kidney injury) [N17.9] Altered mental status, unspecified altered mental status type [R41.82] Acute metabolic encephalopathy [G93.41] Diarrhea, unspecified type [R19.7]  Patient is known to our practice and is followed outpatient by Dr. Douglas.  Patient was last seen in office in May for routine follow-up.   Update Patient seen sitting up in bed Alert and oriented Hard of hearing Awaiting breakfast States she feels people are trying to force her into things  Creatinine 5.68  Objective:  Vital signs in last 24 hours:  Temp:  [98.1 F (36.7 C)-98.6 F (37 C)] 98.1 F (36.7 C) (10/02 1208) Pulse Rate:  [55-62] 59 (10/02 1208) Resp:  [15-20] 17 (10/02 1208) BP: (138-180)/(61-79) 138/61 (10/02 1208) SpO2:  [97 %-99 %] 99 % (10/02 1208)  Weight change:  Filed Weights   06/14/24 0814  Weight: 70.3 kg    Intake/Output: I/O last 3 completed shifts: In: 350 [Blood:350] Out: -    Intake/Output this shift:  No intake/output data recorded.  Physical Exam: General: NAD  Head: Normocephalic, atraumatic. Moist oral mucosal membranes  Eyes: Anicteric  Lungs:  Clear to auscultation, normal effort  Heart: Regular rate and rhythm  Abdomen:  Soft, nontender  Extremities: No peripheral edema.  Neurologic: Awake, responding appropriately  Skin: Warm,dry, no rash  Access: None    Basic Metabolic Panel: Recent Labs  Lab 06/17/24 0444 06/17/24 1030 06/18/24 0355 06/20/24 0447 06/21/24 1104  NA 135 138 137 137 134*  K 3.7 3.8 3.7 4.1 3.9  CL 104 101 103 104 105  CO2 22 20* 21* 22 20*  GLUCOSE 82 103* 64* 74 91  BUN 89*  90* 89* 86* 82*  CREATININE 5.66* 5.50* 5.54* 5.42* 5.68*  CALCIUM  7.5* 7.9* 8.0* 8.0* 8.2*    Liver Function Tests: No results for input(s): AST, ALT, ALKPHOS, BILITOT, PROT, ALBUMIN in the last 168 hours.  No results for input(s): LIPASE, AMYLASE in the last 168 hours. No results for input(s): AMMONIA in the last 168 hours.  CBC: Recent Labs  Lab 06/17/24 0444 06/17/24 1030 06/18/24 0355 06/18/24 2133 06/19/24 0317 06/19/24 1007 06/20/24 0447 06/21/24 1104  WBC 13.0* 11.8* 11.1*  --   --   --  9.9 9.8  NEUTROABS  --   --   --   --   --   --   --  6.5  HGB 7.2* 7.2* 8.2* 7.4* 7.0* 7.1* 8.0* 8.3*  HCT 21.9* 21.3* 24.4*  --   --   --  24.6* 25.5*  MCV 90.9 90.6 89.1  --   --   --  92.5 92.7  PLT 214 199 230  --   --   --  198 201    Cardiac Enzymes: No results for input(s): CKTOTAL, CKMB, CKMBINDEX, TROPONINI in the last 168 hours.   BNP: Invalid input(s): POCBNP  CBG: No results for input(s): GLUCAP in the last 168 hours.   Microbiology: Results for orders placed or performed during the hospital encounter of 06/13/24  Urine Culture (for pregnant, neutropenic or urologic patients or patients with an indwelling urinary catheter)     Status: Abnormal   Collection Time: 06/13/24  9:49 PM  Specimen: Urine, Catheterized  Result Value Ref Range Status   Specimen Description   Final    URINE, CATHETERIZED Performed at [facility], [address] [place], [place] [address]    Special Requests   Final    NONE Performed at [facility], [address] [place], [place] [address]    Culture >=100,000 COLONIES/mL ESCHERICHIA COLI (A)  Final   Report Status 06/15/2024 FINAL  Final   Organism ID, Bacteria ESCHERICHIA COLI (A)  Final      Susceptibility   Escherichia coli - MIC*    AMPICILLIN 4 SENSITIVE Sensitive     CEFAZOLIN  (URINE) Value in next row Sensitive      4 SENSITIVEThis is a modified FDA-approved test  that has been validated and its performance characteristics determined by the reporting laboratory.  This laboratory is certified under the Clinical Laboratory Improvement Amendments CLIA as qualified to perform high complexity clinical laboratory testing.    CEFEPIME Value in next row Sensitive      4 SENSITIVEThis is a modified FDA-approved test that has been validated and its performance characteristics determined by the reporting laboratory.  This laboratory is certified under the Clinical Laboratory Improvement Amendments CLIA as qualified to perform high complexity clinical laboratory testing.    ERTAPENEM Value in next row Sensitive      4 SENSITIVEThis is a modified FDA-approved test that has been validated and its performance characteristics determined by the reporting laboratory.  This laboratory is certified under the Clinical Laboratory Improvement Amendments CLIA as qualified to perform high complexity clinical laboratory testing.    CEFTRIAXONE  Value in next row Sensitive      4 SENSITIVEThis is a modified FDA-approved test that has been validated and its performance characteristics determined by the reporting laboratory.  This laboratory is certified under the Clinical Laboratory Improvement Amendments CLIA as qualified to perform high complexity clinical laboratory testing.    CIPROFLOXACIN Value in next row Sensitive      4 SENSITIVEThis is a modified FDA-approved test that has been validated and its performance characteristics determined by the reporting laboratory.  This laboratory is certified under the Clinical Laboratory Improvement Amendments CLIA as qualified to perform high complexity clinical laboratory testing.    GENTAMICIN Value in next row Sensitive      4 SENSITIVEThis is a modified FDA-approved test that has been validated and its performance characteristics determined by the reporting laboratory.  This laboratory is certified under the Clinical Laboratory Improvement  Amendments CLIA as qualified to perform high complexity clinical laboratory testing.    NITROFURANTOIN Value in next row Sensitive      4 SENSITIVEThis is a modified FDA-approved test that has been validated and its performance characteristics determined by the reporting laboratory.  This laboratory is certified under the Clinical Laboratory Improvement Amendments CLIA as qualified to perform high complexity clinical laboratory testing.    TRIMETH/SULFA Value in next row Sensitive      4 SENSITIVEThis is a modified FDA-approved test that has been validated and its performance characteristics determined by the reporting laboratory.  This laboratory is certified under the Clinical Laboratory Improvement Amendments CLIA as qualified to perform high complexity clinical laboratory testing.    AMPICILLIN/SULBACTAM Value in next row Sensitive      4 SENSITIVEThis is a modified FDA-approved test that has been validated and its performance characteristics determined by the reporting laboratory.  This laboratory is certified under the Clinical Laboratory Improvement Amendments CLIA as qualified to perform high  complexity clinical laboratory testing.    PIP/TAZO Value in next row Sensitive      <=4 SENSITIVEThis is a modified FDA-approved test that has been validated and its performance characteristics determined by the reporting laboratory.  This laboratory is certified under the Clinical Laboratory Improvement Amendments CLIA as qualified to perform high complexity clinical laboratory testing.    MEROPENEM Value in next row Sensitive      <=4 SENSITIVEThis is a modified FDA-approved test that has been validated and its performance characteristics determined by the reporting laboratory.  This laboratory is certified under the Clinical Laboratory Improvement Amendments CLIA as qualified to perform high complexity clinical laboratory testing.    * >=100,000 COLONIES/mL ESCHERICHIA COLI  Gastrointestinal Panel by PCR  , Stool     Status: Abnormal   Collection Time: 06/13/24 11:41 PM   Specimen: Stool  Result Value Ref Range Status   Campylobacter species NOT DETECTED NOT DETECTED Final   Plesimonas shigelloides NOT DETECTED NOT DETECTED Final   Salmonella species NOT DETECTED NOT DETECTED Final   Yersinia enterocolitica NOT DETECTED NOT DETECTED Final   Vibrio species NOT DETECTED NOT DETECTED Final   Vibrio cholerae NOT DETECTED NOT DETECTED Final   Enteroaggregative E coli (EAEC) NOT DETECTED NOT DETECTED Final   Enteropathogenic E coli (EPEC) DETECTED (A) NOT DETECTED Final    Comment: RESULT CALLED TO, READ BACK BY AND VERIFIED WITH:  MYRDA [facility] AT 0154 06/14/24 JG    Enterotoxigenic E coli (ETEC) NOT DETECTED NOT DETECTED Final   Shiga like toxin producing E coli (STEC) NOT DETECTED NOT DETECTED Final   Shigella/Enteroinvasive E coli (EIEC) NOT DETECTED NOT DETECTED Final   Cryptosporidium NOT DETECTED NOT DETECTED Final   Cyclospora cayetanensis NOT DETECTED NOT DETECTED Final   Entamoeba histolytica NOT DETECTED NOT DETECTED Final   Giardia lamblia NOT DETECTED NOT DETECTED Final   Adenovirus F40/41 NOT DETECTED NOT DETECTED Final   Astrovirus NOT DETECTED NOT DETECTED Final   Norovirus GI/GII NOT DETECTED NOT DETECTED Final   Rotavirus A NOT DETECTED NOT DETECTED Final   Sapovirus (I, II, IV, and V) NOT DETECTED NOT DETECTED Final    Comment: Performed at [facility], [address] [place], [place] [address]  C Difficile Quick Screen w PCR reflex     Status: Abnormal   Collection Time: 06/13/24 11:41 PM   Specimen: Stool  Result Value Ref Range Status   C Diff antigen POSITIVE (A) NEGATIVE Final   C Diff toxin NEGATIVE NEGATIVE Final   C Diff interpretation Results are indeterminate. See PCR results.  Final    Comment: Performed at [facility], [address] [place], [place] [address]  C. Diff by PCR, Reflexed     Status: None   Collection Time: 06/13/24 11:41  PM  Result Value Ref Range Status   Toxigenic C. Difficile by PCR NEGATIVE NEGATIVE Final    Comment: Patient is colonized with non toxigenic C. difficile. May not need treatment unless significant symptoms are present.   Hypervirulent Strain PRESUMPTIVE NEGATIVE PRESUMPTIVE NEGATIVE Final    Comment: Performed at [facility], [address] [place], [place] [address]    Coagulation Studies: No results for input(s): LABPROT, INR in the last 72 hours.  Urinalysis: No results for input(s): COLORURINE, LABSPEC, PHURINE, GLUCOSEU, HGBUR, BILIRUBINUR, KETONESUR, PROTEINUR, UROBILINOGEN, NITRITE, LEUKOCYTESUR in the last 72 hours.  Invalid input(s): APPERANCEUR     Imaging: No results found.    Medications:  sodium chloride    Intravenous Once   atorvastatin   40 mg Oral QPM   calcitRIOL   0.25 mcg Oral Daily   cloNIDine   0.2 mg Oral BID   hydrocortisone   Rectal TID   labetalol   300 mg Oral BID   latanoprost   1 drop Both Eyes QHS   levothyroxine   75 mcg Oral Q0600   pantoprazole   40 mg Oral Daily   acetaminophen  **OR** acetaminophen , hydrALAZINE , HYDROcodone -acetaminophen , ondansetron  **OR** ondansetron  (ZOFRAN ) IV, simethicone   Assessment/ Plan:  Ms. MELEANE SELINGER is a 78 y.o.  female with past medical conditions including CAD, hypertension, stroke, and chronic kidney disease stage V.  Patient presents to the emergency department complaining of abdominal pain, diarrhea and weakness.  She has been admitted for Dehydration [E86.0] AKI (acute kidney injury) [N17.9] Altered mental status, unspecified altered mental status type [R41.82] Acute metabolic encephalopathy [G93.41] Diarrhea, unspecified type [R19.7]   Acute Kidney Injury on chronic kidney disease stage V with baseline creatinine 4.27 and GFR of 10 on last office visit on 01/31/2024.  Acute kidney injury secondary to dehydration from GI losses No recent IV contrast exposure.   Creatinine on ED arrival 5.18.  Patient has been adamant during previous admissions and in office she would not be interested in renal replacement therapy.    Creatinine rose again today.  Appreciate palliative care meeting with family.  Goals of care discussion ongoing.  Lab Results  Component Value Date   CREATININE 5.68 (H) 06/21/2024   CREATININE 5.42 (H) 06/20/2024   CREATININE 5.54 (H) 06/18/2024    Intake/Output Summary (Last 24 hours) at 06/21/2024 1329 Last data filed at 06/20/2024 1937 Gross per 24 hour  Intake 0 ml  Output --  Net 0 ml   2.  Acute metabolic acidosis, serum bicarb 15 on ED arrival.  S bicarb 20 today.  3. Anemia of chronic kidney disease Lab Results  Component Value Date   HGB 8.3 (L) 06/21/2024  Patient received blood transfusion on 9/28. Hgb continues to increase  4.  Hypertension with chronic kidney disease.  Home regimen includes clonidine  and labetalol , both have been restarted. Blood pressure slightly elevated today, 175/75    LOS: 7 Pleasant Britz 10/2/20251:29 PM

## 2024-06-22 DIAGNOSIS — N3001 Acute cystitis with hematuria: Secondary | ICD-10-CM | POA: Diagnosis not present

## 2024-06-22 DIAGNOSIS — R4182 Altered mental status, unspecified: Secondary | ICD-10-CM | POA: Diagnosis not present

## 2024-06-22 DIAGNOSIS — N179 Acute kidney failure, unspecified: Secondary | ICD-10-CM | POA: Diagnosis not present

## 2024-06-22 DIAGNOSIS — Z515 Encounter for palliative care: Secondary | ICD-10-CM | POA: Diagnosis not present

## 2024-06-22 DIAGNOSIS — A09 Infectious gastroenteritis and colitis, unspecified: Secondary | ICD-10-CM | POA: Diagnosis not present

## 2024-06-22 NOTE — Progress Notes (Signed)
 [facility]  ROUNDING NOTE   Subjective:   Betty Nielsen is a 79 year old female with past medical conditions including CAD, hypertension, stroke, and chronic kidney disease stage V.  Patient presents to the emergency department complaining of abdominal pain, diarrhea and weakness.  She has been admitted for Dehydration [E86.0] AKI (acute kidney injury) [N17.9] Altered mental status, unspecified altered mental status type [R41.82] Acute metabolic encephalopathy [G93.41] Diarrhea, unspecified type [R19.7]  Patient is known to our practice and is followed outpatient by Dr. Douglas.  Patient was last seen in office in May for routine follow-up.   Update Patient sitting up in bed No family present   Objective:  Vital signs in last 24 hours:  Temp:  [98 F (36.7 C)-98.5 F (36.9 C)] 98.4 F (36.9 C) (10/03 1211) Pulse Rate:  [59-63] 63 (10/03 1211) Resp:  [18] 18 (10/03 0420) BP: (127-181)/(61-83) 144/73 (10/03 1211) SpO2:  [99 %-100 %] 99 % (10/03 1211)  Weight change:  Filed Weights   06/14/24 0814  Weight: 70.3 kg    Intake/Output: I/O last 3 completed shifts: In: 150 [P.O.:150] Out: -    Intake/Output this shift:  Total I/O In: 240 [P.O.:240] Out: -   Physical Exam: General: NAD  Head: Normocephalic, atraumatic. Moist oral mucosal membranes  Eyes: Anicteric  Lungs:  Clear to auscultation, normal effort  Heart: Regular rate and rhythm  Abdomen:  Soft, nontender  Extremities: No peripheral edema.  Neurologic: Awake, responding appropriately  Skin: Warm,dry, no rash  Access: None    Basic Metabolic Panel: Recent Labs  Lab 06/17/24 0444 06/17/24 1030 06/18/24 0355 06/20/24 0447 06/21/24 1104  NA 135 138 137 137 134*  K 3.7 3.8 3.7 4.1 3.9  CL 104 101 103 104 105  CO2 22 20* 21* 22 20*  GLUCOSE 82 103* 64* 74 91  BUN 89* 90* 89* 86* 82*  CREATININE 5.66* 5.50* 5.54* 5.42* 5.68*  CALCIUM  7.5* 7.9* 8.0* 8.0* 8.2*    Liver Function  Tests: No results for input(s): AST, ALT, ALKPHOS, BILITOT, PROT, ALBUMIN in the last 168 hours.  No results for input(s): LIPASE, AMYLASE in the last 168 hours. No results for input(s): AMMONIA in the last 168 hours.  CBC: Recent Labs  Lab 06/17/24 0444 06/17/24 1030 06/18/24 0355 06/18/24 2133 06/19/24 0317 06/19/24 1007 06/20/24 0447 06/21/24 1104  WBC 13.0* 11.8* 11.1*  --   --   --  9.9 9.8  NEUTROABS  --   --   --   --   --   --   --  6.5  HGB 7.2* 7.2* 8.2* 7.4* 7.0* 7.1* 8.0* 8.3*  HCT 21.9* 21.3* 24.4*  --   --   --  24.6* 25.5*  MCV 90.9 90.6 89.1  --   --   --  92.5 92.7  PLT 214 199 230  --   --   --  198 201    Cardiac Enzymes: No results for input(s): CKTOTAL, CKMB, CKMBINDEX, TROPONINI in the last 168 hours.   BNP: Invalid input(s): POCBNP  CBG: No results for input(s): GLUCAP in the last 168 hours.   Microbiology: Results for orders placed or performed during the hospital encounter of 06/13/24  Urine Culture (for pregnant, neutropenic or urologic patients or patients with an indwelling urinary catheter)     Status: Abnormal   Collection Time: 06/13/24  9:49 PM   Specimen: Urine, Catheterized  Result Value Ref Range Status   Specimen Description  Final    URINE, CATHETERIZED Performed at [facility], [address] [place], [place] [address]    Special Requests   Final    NONE Performed at [facility], [address] [place], [place] [address]    Culture >=100,000 COLONIES/mL ESCHERICHIA COLI (A)  Final   Report Status 06/15/2024 FINAL  Final   Organism ID, Bacteria ESCHERICHIA COLI (A)  Final      Susceptibility   Escherichia coli - MIC*    AMPICILLIN 4 SENSITIVE Sensitive     CEFAZOLIN  (URINE) Value in next row Sensitive      4 SENSITIVEThis is a modified FDA-approved test that has been validated and its performance characteristics determined by the reporting laboratory.  This laboratory  is certified under the Clinical Laboratory Improvement Amendments CLIA as qualified to perform high complexity clinical laboratory testing.    CEFEPIME Value in next row Sensitive      4 SENSITIVEThis is a modified FDA-approved test that has been validated and its performance characteristics determined by the reporting laboratory.  This laboratory is certified under the Clinical Laboratory Improvement Amendments CLIA as qualified to perform high complexity clinical laboratory testing.    ERTAPENEM Value in next row Sensitive      4 SENSITIVEThis is a modified FDA-approved test that has been validated and its performance characteristics determined by the reporting laboratory.  This laboratory is certified under the Clinical Laboratory Improvement Amendments CLIA as qualified to perform high complexity clinical laboratory testing.    CEFTRIAXONE  Value in next row Sensitive      4 SENSITIVEThis is a modified FDA-approved test that has been validated and its performance characteristics determined by the reporting laboratory.  This laboratory is certified under the Clinical Laboratory Improvement Amendments CLIA as qualified to perform high complexity clinical laboratory testing.    CIPROFLOXACIN Value in next row Sensitive      4 SENSITIVEThis is a modified FDA-approved test that has been validated and its performance characteristics determined by the reporting laboratory.  This laboratory is certified under the Clinical Laboratory Improvement Amendments CLIA as qualified to perform high complexity clinical laboratory testing.    GENTAMICIN Value in next row Sensitive      4 SENSITIVEThis is a modified FDA-approved test that has been validated and its performance characteristics determined by the reporting laboratory.  This laboratory is certified under the Clinical Laboratory Improvement Amendments CLIA as qualified to perform high complexity clinical laboratory testing.    NITROFURANTOIN Value in next row  Sensitive      4 SENSITIVEThis is a modified FDA-approved test that has been validated and its performance characteristics determined by the reporting laboratory.  This laboratory is certified under the Clinical Laboratory Improvement Amendments CLIA as qualified to perform high complexity clinical laboratory testing.    TRIMETH/SULFA Value in next row Sensitive      4 SENSITIVEThis is a modified FDA-approved test that has been validated and its performance characteristics determined by the reporting laboratory.  This laboratory is certified under the Clinical Laboratory Improvement Amendments CLIA as qualified to perform high complexity clinical laboratory testing.    AMPICILLIN/SULBACTAM Value in next row Sensitive      4 SENSITIVEThis is a modified FDA-approved test that has been validated and its performance characteristics determined by the reporting laboratory.  This laboratory is certified under the Clinical Laboratory Improvement Amendments CLIA as qualified to perform high complexity clinical laboratory testing.    PIP/TAZO Value in next row Sensitive      <=  4 SENSITIVEThis is a modified FDA-approved test that has been validated and its performance characteristics determined by the reporting laboratory.  This laboratory is certified under the Clinical Laboratory Improvement Amendments CLIA as qualified to perform high complexity clinical laboratory testing.    MEROPENEM Value in next row Sensitive      <=4 SENSITIVEThis is a modified FDA-approved test that has been validated and its performance characteristics determined by the reporting laboratory.  This laboratory is certified under the Clinical Laboratory Improvement Amendments CLIA as qualified to perform high complexity clinical laboratory testing.    * >=100,000 COLONIES/mL ESCHERICHIA COLI  Gastrointestinal Panel by PCR , Stool     Status: Abnormal   Collection Time: 06/13/24 11:41 PM   Specimen: Stool  Result Value Ref Range Status    Campylobacter species NOT DETECTED NOT DETECTED Final   Plesimonas shigelloides NOT DETECTED NOT DETECTED Final   Salmonella species NOT DETECTED NOT DETECTED Final   Yersinia enterocolitica NOT DETECTED NOT DETECTED Final   Vibrio species NOT DETECTED NOT DETECTED Final   Vibrio cholerae NOT DETECTED NOT DETECTED Final   Enteroaggregative E coli (EAEC) NOT DETECTED NOT DETECTED Final   Enteropathogenic E coli (EPEC) DETECTED (A) NOT DETECTED Final    Comment: RESULT CALLED TO, READ BACK BY AND VERIFIED WITH:  MYRDA [facility] AT 0154 06/14/24 JG    Enterotoxigenic E coli (ETEC) NOT DETECTED NOT DETECTED Final   Shiga like toxin producing E coli (STEC) NOT DETECTED NOT DETECTED Final   Shigella/Enteroinvasive E coli (EIEC) NOT DETECTED NOT DETECTED Final   Cryptosporidium NOT DETECTED NOT DETECTED Final   Cyclospora cayetanensis NOT DETECTED NOT DETECTED Final   Entamoeba histolytica NOT DETECTED NOT DETECTED Final   Giardia lamblia NOT DETECTED NOT DETECTED Final   Adenovirus F40/41 NOT DETECTED NOT DETECTED Final   Astrovirus NOT DETECTED NOT DETECTED Final   Norovirus GI/GII NOT DETECTED NOT DETECTED Final   Rotavirus A NOT DETECTED NOT DETECTED Final   Sapovirus (I, II, IV, and V) NOT DETECTED NOT DETECTED Final    Comment: Performed at [facility], [address] [place], [place] [address]  C Difficile Quick Screen w PCR reflex     Status: Abnormal   Collection Time: 06/13/24 11:41 PM   Specimen: Stool  Result Value Ref Range Status   C Diff antigen POSITIVE (A) NEGATIVE Final   C Diff toxin NEGATIVE NEGATIVE Final   C Diff interpretation Results are indeterminate. See PCR results.  Final    Comment: Performed at [facility], [address] [place], [place] [address]  C. Diff by PCR, Reflexed     Status: None   Collection Time: 06/13/24 11:41 PM  Result Value Ref Range Status   Toxigenic C. Difficile by PCR NEGATIVE NEGATIVE Final    Comment: Patient is  colonized with non toxigenic C. difficile. May not need treatment unless significant symptoms are present.   Hypervirulent Strain PRESUMPTIVE NEGATIVE PRESUMPTIVE NEGATIVE Final    Comment: Performed at [facility], [address] [place], [place] [address]    Coagulation Studies: No results for input(s): LABPROT, INR in the last 72 hours.  Urinalysis: No results for input(s): COLORURINE, LABSPEC, PHURINE, GLUCOSEU, HGBUR, BILIRUBINUR, KETONESUR, PROTEINUR, UROBILINOGEN, NITRITE, LEUKOCYTESUR in the last 72 hours.  Invalid input(s): APPERANCEUR     Imaging: No results found.    Medications:      sodium chloride    Intravenous Once   aspirin  EC  81 mg Oral Daily  atorvastatin   40 mg Oral QPM   calcitRIOL   0.25 mcg Oral Daily   cloNIDine   0.2 mg Oral BID   clopidogrel   75 mg Oral Daily   hydrocortisone   Rectal TID   labetalol   300 mg Oral BID   latanoprost   1 drop Both Eyes QHS   levothyroxine   75 mcg Oral Q0600   pantoprazole   40 mg Oral Daily   acetaminophen  **OR** acetaminophen , hydrALAZINE , HYDROcodone -acetaminophen , ondansetron  **OR** ondansetron  (ZOFRAN ) IV, simethicone   Assessment/ Plan:  Betty Nielsen is a 78 y.o.  female with past medical conditions including CAD, hypertension, stroke, and chronic kidney disease stage V.  Patient presents to the emergency department complaining of abdominal pain, diarrhea and weakness.  She has been admitted for Dehydration [E86.0] AKI (acute kidney injury) [N17.9] Altered mental status, unspecified altered mental status type [R41.82] Acute metabolic encephalopathy [G93.41] Diarrhea, unspecified type [R19.7]   Acute Kidney Injury on chronic kidney disease stage V with baseline creatinine 4.27 and GFR of 10 on last office visit on 01/31/2024.  Acute kidney injury secondary to dehydration from GI losses No recent IV contrast exposure.  Creatinine on ED arrival 5.18.  Patient has been  adamant during previous admissions and in office she would not be interested in renal replacement therapy.    Creatinine remains elevated. Will schedule follow up appt with Dr Douglas in 1-2 weeks.   Lab Results  Component Value Date   CREATININE 5.68 (H) 06/21/2024   CREATININE 5.42 (H) 06/20/2024   CREATININE 5.54 (H) 06/18/2024    Intake/Output Summary (Last 24 hours) at 06/22/2024 1349 Last data filed at 06/22/2024 0900 Gross per 24 hour  Intake 390 ml  Output --  Net 390 ml   2.  Acute metabolic acidosis, serum bicarb 15 on ED arrival.  S bicarb 20  3. Anemia of chronic kidney disease Lab Results  Component Value Date   HGB 8.3 (L) 06/21/2024  Patient received blood transfusion on 9/28.   4.  Hypertension with chronic kidney disease.  Home regimen includes clonidine  and labetalol , both have been restarted. Blood pressure stable for this patient    LOS: 8 Wyley Hack 10/3/20251:49 PM

## 2024-06-22 NOTE — Progress Notes (Signed)
 Palliative Care Progress Note, Assessment & Plan   Patient Name: Betty Nielsen       Date: 06/22/2024 DOB: 11-24-1945  Age: 78 y.o. MRN#: [id number] Attending Physician: No att. providers found Primary Care Physician: Sowles, Krichna, MD Admit Date: 06/13/2024  Subjective: Patient is sitting up in bed watching TV.  She is awake and alert, acknowledges my presence, and is able to make her wishes known.  I met alongside attending Dr. Jhonny.  No family or friends present during my visit.  HPI: 78 y.o. female  with past medical history of CAD, hypertension, AAA s/p endovascular repair, lumbar spine stenosis, arthritis prior stroke, surgical hypothyroidism, and CKD-stage V admitted on 06/13/2024 with generalized weakness, increased somnolence, and N/V/D the day PTA.   Upon presentation to the ED, labs revealed worsening renal function with creatinine of 5.1 (up from most recent baseline of 3-4).  Additionally, hemoglobin was at baseline of 8.   Course of hospitalization included placement of rectal tube for diarrhea though C. difficile testing was negative multiple, treatment of enteropathogenic E. coli from blood cultures (Rocephin ), numerous blood transfusion for low hemoglobin, and slowly increasing creatinine.   Nephrology has been consulted.   As per chart review, patient has been cleared that she does not wish to pursue dialysis.   PMT was consulted to support patient and family with goals of care discussions.   Summary of counseling/coordination of care: Extensive chart review completed prior to meeting patient including labs, vital signs, imaging, progress notes, orders, and available advanced directive documents from current and previous encounters.   After reviewing the patient's chart and  assessing the patient at bedside, I spoke with patient in regards to symptom management and goals of care.   Attempted to discuss patient's current medical situation.  Patient continues states she has heard from no one about why she is here.  She shares she came in for a stomach issue and that everyone continues to talk about everything but her stomach issues.  Attending and I attempted multiple times to clarify that various members of medical teams have updated patient daily regarding her medical situation, treatment, and plan of care.  However, patient continues to deny that anyone has spoken with her about her plan of care.  During attending's outline of her current treatment plan, patient called her son on the phone.  Son placed on speaker phone for the rest of the visit.  Attending discussed course of hospitalization, treatment plan, and plan of care with patient and son via telephone.  During discussion, patient unmuted the TV and appeared to not be listening.  After extensive discussion of patient's wishes as well as her refulsa to allow blood draw for HGb monitoring, education provided on importance of aspirin  and Plavix  for future stroke prevention. Patient continued to endorsed not accepting hemodialysis/SNF/home health.   Patient remained clear that she wishes to return home as soon as possible.  Patient son was in agreement with plans to pick her up when discharge is in place.  Plan is to discharge patient home today.  No change to plan of care at this time.  Physical Exam Vitals reviewed.  Constitutional:      General:  She is not in acute distress.    Appearance: She is normal weight.  HENT:     Head: Normocephalic and atraumatic.     Right Ear: Decreased hearing noted.     Left Ear: Decreased hearing noted.  Eyes:     Pupils: Pupils are equal, round, and reactive to light.  Pulmonary:     Effort: Pulmonary effort is normal.  Abdominal:     Palpations: Abdomen is soft.   Musculoskeletal:     Comments: Generalized weakness MAETC  Skin:    General: Skin is warm and dry.  Neurological:     Mental Status: She is alert and oriented to person, place, and time.  Psychiatric:        Mood and Affect: Mood normal.        Behavior: Behavior normal.             Visit includes: Detailed review of medical records (labs, imaging, vital signs), medically appropriate exam (mental status, respiratory, cardiac, skin), discussed with treatment team, counseling and educating patient, family and staff, documenting clinical information, medication management and coordination of care.  Lamarr L. Arvid, DNP, FNP-BC Palliative Medicine Team

## 2024-06-22 NOTE — TOC Transition Note (Signed)
 Transition of Care [facility] - Discharge Note   Patient Details  Name: Betty Nielsen MRN: [id number] Date of Birth: Aug 10, 1946  Transition of Care [facility]) CM/SW Contact:  Lauraine JAYSON Carpen, LCSW Phone Number: 06/22/2024, 12:43 PM   Clinical Narrative:  Patient has orders to discharge home today. Patient confirmed she does have a RW at home and it works well for her. No further concerns. CSW signing off.   Final next level of care: Home/Self Care Barriers to Discharge: Barriers Resolved   Patient Goals and CMS Choice            Discharge Placement                Patient to be transferred to facility by: Son   Patient and family notified of of transfer: 06/22/24  Discharge Plan and Services Additional resources added to the After Visit Summary for       Post Acute Care Choice:  (TBD)                               Social Drivers of Health (SDOH) Interventions SDOH Screenings   Food Insecurity: No Food Insecurity (06/14/2024)  Housing: Low Risk  (06/14/2024)  Transportation Needs: No Transportation Needs (06/14/2024)  Utilities: Not At Risk (06/14/2024)  Alcohol Screen: Low Risk  (09/01/2023)  Depression (PHQ2-9): Low Risk  (12/30/2023)  Financial Resource Strain: Low Risk  (09/01/2023)  Physical Activity: Inactive (09/01/2023)  Social Connections: Socially Isolated (06/14/2024)  Stress: No Stress Concern Present (09/01/2023)  Tobacco Use: Medium Risk (12/30/2023)  Health Literacy: Adequate Health Literacy (09/01/2023)     Readmission Risk Interventions     No data to display

## 2024-06-22 NOTE — Plan of Care (Signed)
  Problem: Education: Goal: Knowledge of General Education information will improve Description: Including pain rating scale, medication(s)/side effects and non-pharmacologic comfort measures Outcome: Progressing   Problem: Health Behavior/Discharge Planning: Goal: Ability to manage health-related needs will improve Outcome: Progressing   Problem: Clinical Measurements: Goal: Diagnostic test results will improve Outcome: Progressing Goal: Cardiovascular complication will be avoided Outcome: Progressing   Problem: Activity: Goal: Risk for activity intolerance will decrease Outcome: Progressing   Problem: Nutrition: Goal: Adequate nutrition will be maintained Outcome: Progressing   Problem: Coping: Goal: Level of anxiety will decrease Outcome: Progressing   Problem: Elimination: Goal: Will not experience complications related to bowel motility Outcome: Progressing   Problem: Safety: Goal: Ability to remain free from injury will improve Outcome: Progressing

## 2024-06-22 NOTE — Discharge Summary (Signed)
 Physician Discharge Summary  Betty Nielsen FMW:[id number] DOB: 05-Sep-1946 DOA: 06/13/2024  PCP: Sowles, Krichna, MD  Admit date: 06/13/2024 Discharge date: 06/22/2024  Admitted From: Home Disposition:  Home  Recommendations for Outpatient Follow-up:  Follow up with PCP in 1-2 weeks Follow up nephrology 2 weeks  Home Health:No (offered, patient refused, also refused SNF placement)  Equipment/Devices:None   Discharge Condition:Stable  CODE STATUS:FULL  Diet recommendation: Reg  Brief/Interim Summary:  78 y.o. female with medical history significant for HTN, AAA s/p endovascular repair, prior stroke, CAD surgical hypothyroidism, CKD 5 who has declined dialysis planning referred by her nephrologist, hypothyroidism, anemia of chronic disease, lumbar spinal stenosis, being admitted with acute encephalopathy  in the setting of acute diarrhea.  She was brought in by her son with generalized weakness, increased somnolence and nausea vomiting and diarrhea starting earlier in the day.  Patient is very somnolent and unable to contribute to the history.     9/25.  Patient having a lot of diarrhea and rectal tube was placed.  Started empirically on vancomycin  even though C. difficile testing was negative.  Enteropathogenic E. coli growing out of culture.  Started on Rocephin  for 3 days. 9/26.  Patient declined blood transfusion.  Had rectal tube in on the morning but came out in the afternoon.  Physical therapy recommending rehab 9/27.  Hemoglobin down to 6.3.  Agreeable to blood transfusion.  Creatinine has not improved.  Has declined dialysis. 9/28.  Agreeable to another unit of blood with hemoglobin 7.2. 9/29.  Hemoglobin 8.2 today.  Creatinine still impaired at 5.54.  Spoke with nephrology and will restart gentle fluid. 9/30.  Had rectal bleeding overnight.  Holding aspirin , Plavix  and heparin  subcutaneous injection.  Hemoglobin 7.0.  Will give 1 unit of packed red blood cells today 10/1: Clinical  status overall unchanged.  Palliative care consult requested 10/2: Patient continues to vehemently refused dialysis.  Also refuses admission to hospice and skilled nursing facility and home health services.  Hb stable  10/3: Very lengthy conversation today in conjunction with palliative care.  Patient continues to refuse initiation of dialysis, skilled nursing facility placement, home health services.  Also refuses to de-escalation of care and hospice referral.  Spoke with patient's son via phone while in the room.  All questions answered.  Patient and son are aware that she has a progressive deterioration in her kidney function that is not likely to improve without intervention.  She will remain extremely high risk for hospital readmission.    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 coli.  Was empirically placed on p.o. vancomycin  though there is no indication.  C. difficile antigen positive but toxin and confirmatory PCR have been negative.  Vancomycin  discontinued.  GI illness resolved on discharge.   Lower GI bleed Patient had blood loss anemia initially attributed to antiplatelet agents.  These were held for 48 hours.  Hemoglobin appears stable at time of discharge however  on the actual day of discharge patient refused lab work despite my explanation of importance.  Discussed with patient  and son.  Will cautiously restart antiplatelet agents to reduce stroke risk however patient will remain high risk for bleed.   Acute renal failure superimposed on stage 4 chronic kidney disease (HCC) Secondary to GI losses, baseline worsening of CKD creatinine 5.54 with a GFR of 7.  Nephrology following.  Patient has refused dialysis in the past.  Palliative care consultation appreciated.  Patient continues to refuse dialysis.  At this time she is also refusing hospice services, skilled nursing facility, and home health services.      Discharge Instructions  Discharge Instructions     Diet - low sodium heart healthy   Complete by: As directed    Increase activity slowly   Complete by: As directed       Allergies as of 06/22/2024       Reactions   Ace Inhibitors Cough   Ciprofloxacin Other (See Comments)   unknown   Citalopram     hyponatremia   Hydrocodone -acetaminophen  Other (See Comments)   unknown   Norvasc [amlodipine Besylate]    Pruritus   Pantoprazole  Sodium Other (See Comments)   Cramps   Pravastatin Other (See Comments)   Stomach cramps   Sulfa Antibiotics    unknown   Clonidine  Other (See Comments)   Per patient told by allergist that would be best to stop but nephrologist recommended her to stay on medication         Medication List     TAKE these medications    acetaminophen  650 MG CR tablet Commonly known as: TYLENOL  Take 1 tablet (650 mg total) by mouth every 8 (eight) hours as needed for pain.   aspirin  EC 81 MG tablet Take 81 mg by mouth daily. Swallow whole.   atorvastatin  40 MG tablet Commonly known as: LIPITOR TAKE 1 TABLET BY MOUTH ONCE DAILY AT  6PM.  CHOLESTEROL  MEDICATION.   calcitRIOL  0.25 MCG capsule Commonly known as: ROCALTROL  Take 0.25 mcg by mouth daily.   Cholecalciferol  125 MCG (5000 UT) Tabs Take 5,000 Units by mouth daily.   cloNIDine  0.2 MG tablet Commonly known as: CATAPRES  Take 1 tablet (0.2 mg total) by mouth 2  (two) times daily.   clopidogrel  75 MG tablet Commonly known as: PLAVIX  TAKE 1 TABLET BY MOUTH ONCE DAILY .  BLOOD  THINNER  TO  HELP  REDUCE  STROKE  RISK   ezetimibe  10 MG tablet Commonly known as: ZETIA  Take 1 tablet (10 mg total) by mouth daily.   labetalol  300 MG tablet Commonly known as: NORMODYNE  Take 1 tablet (300 mg total) by mouth 2 (two) times daily.   latanoprost  0.005 % ophthalmic solution Commonly known as: XALATAN  Place 1 drop into both eyes at bedtime.   levothyroxine  75 MCG tablet Commonly known as: SYNTHROID  Take 1 tablet (75 mcg total) by mouth daily before breakfast.   senna-docusate 8.6-50 MG tablet Commonly known as: Senokot-S Take 1 tablet by mouth at bedtime as needed for mild constipation.        Allergies  Allergen Reactions   Ace Inhibitors Cough   Ciprofloxacin Other (See Comments)    unknown   Citalopram      hyponatremia   Hydrocodone -Acetaminophen  Other (See Comments)    unknown   Norvasc [Amlodipine Besylate]     Pruritus    Pantoprazole  Sodium Other (See Comments)    Cramps   Pravastatin Other (See Comments)  Stomach cramps   Sulfa Antibiotics     unknown   Clonidine  Other (See Comments)    Per patient told by allergist that would be best to stop but nephrologist recommended her to stay on medication     Consultations: Nephrology Palliative care   Procedures/Studies: CT HEAD WO CONTRAST Result Date: 06/13/2024 EXAM: CT HEAD WITHOUT CONTRAST 06/13/2024 08:44:22 PM TECHNIQUE: CT of the head was performed without the administration of intravenous contrast. Automated exposure control, iterative reconstruction, and/or weight based adjustment of the mA/kV was utilized to reduce the radiation dose to as low as reasonably achievable. COMPARISON: None available. CLINICAL HISTORY: Mental status change, unknown cause; weakness. Pt's son arrives and reports pt called him this morning c/o n/v and weakness. He reports pt normally takes  care of herself and is very active. Pt is drowsy in triage and hard to answer triage questions. Pt on plavix . FINDINGS: BRAIN AND VENTRICLES: No acute hemorrhage. No evidence of acute infarct. No hydrocephalus. No extra-axial collection. No mass effect or midline shift. Mild age-related atrophy. Subcortical and periventricular small vessel ischemic changes. Chronic left thalamic lacunar infarct. Benign dural calcifications. ORBITS: No acute abnormality. SINUSES: No acute abnormality. SOFT TISSUES AND SKULL: No acute soft tissue abnormality. No skull fracture. IMPRESSION: 1. No acute intracranial abnormality. Electronically signed by: Pinkie Pebbles MD 06/13/2024 08:56 PM EDT RP Workstation: [id number]      Subjective: Seen and examined on the day of discharge.  Stable no distress.  Room for discharge home.  Discharge Exam: Vitals:   06/22/24 0742 06/22/24 1211  BP: (!) 176/77 (!) 144/73  Pulse: (!) 59 63  Resp:    Temp: 98.2 F (36.8 C) 98.4 F (36.9 C)  SpO2: 99% 99%   Vitals:   06/22/24 0426 06/22/24 0532 06/22/24 0742 06/22/24 1211  BP: (!) 181/68 (!) 151/61 (!) 176/77 (!) 144/73  Pulse:   (!) 59 63  Resp:      Temp:   98.2 F (36.8 C) 98.4 F (36.9 C)  TempSrc:   Oral Oral  SpO2:   99% 99%  Weight:        General: Pt is alert, awake, not in acute distress Cardiovascular: RRR, S1/S2 +, no rubs, no gallops Respiratory: CTA bilaterally, no wheezing, no rhonchi Abdominal: Soft, NT, ND, bowel sounds + Extremities: no edema, no cyanosis    The results of significant diagnostics from this hospitalization (including imaging, microbiology, ancillary and laboratory) are listed below for reference.     Microbiology: Recent Results (from the past 240 hours)  Urine Culture (for pregnant, neutropenic or urologic patients or patients with an indwelling urinary catheter)     Status: Abnormal   Collection Time: 06/13/24  9:49 PM   Specimen: Urine, Catheterized  Result Value Ref  Range Status   Specimen Description   Final    URINE, CATHETERIZED Performed at [facility], [address], [place], [place] [address]    Special Requests   Final    NONE Performed at [facility], [address] [place], [place] [address]    Culture >=100,000 COLONIES/mL ESCHERICHIA COLI (A)  Final   Report Status 06/15/2024 FINAL  Final   Organism ID, Bacteria ESCHERICHIA COLI (A)  Final      Susceptibility   Escherichia coli - MIC*    AMPICILLIN 4 SENSITIVE Sensitive     CEFAZOLIN  (URINE) Value in next row Sensitive      4 SENSITIVEThis is a modified FDA-approved test that has been  validated and its performance characteristics determined by the reporting laboratory.  This laboratory is certified under the Clinical Laboratory Improvement Amendments CLIA as qualified to perform high complexity clinical laboratory testing.    CEFEPIME Value in next row Sensitive      4 SENSITIVEThis is a modified FDA-approved test that has been validated and its performance characteristics determined by the reporting laboratory.  This laboratory is certified under the Clinical Laboratory Improvement Amendments CLIA as qualified to perform high complexity clinical laboratory testing.    ERTAPENEM Value in next row Sensitive      4 SENSITIVEThis is a modified FDA-approved test that has been validated and its performance characteristics determined by the reporting laboratory.  This laboratory is certified under the Clinical Laboratory Improvement Amendments CLIA as qualified to perform high complexity clinical laboratory testing.    CEFTRIAXONE  Value in next row Sensitive      4 SENSITIVEThis is a modified FDA-approved test that has been validated and its performance characteristics determined by the reporting laboratory.  This laboratory is certified under the Clinical Laboratory Improvement Amendments CLIA as qualified to perform high complexity clinical laboratory testing.     CIPROFLOXACIN Value in next row Sensitive      4 SENSITIVEThis is a modified FDA-approved test that has been validated and its performance characteristics determined by the reporting laboratory.  This laboratory is certified under the Clinical Laboratory Improvement Amendments CLIA as qualified to perform high complexity clinical laboratory testing.    GENTAMICIN Value in next row Sensitive      4 SENSITIVEThis is a modified FDA-approved test that has been validated and its performance characteristics determined by the reporting laboratory.  This laboratory is certified under the Clinical Laboratory Improvement Amendments CLIA as qualified to perform high complexity clinical laboratory testing.    NITROFURANTOIN Value in next row Sensitive      4 SENSITIVEThis is a modified FDA-approved test that has been validated and its performance characteristics determined by the reporting laboratory.  This laboratory is certified under the Clinical Laboratory Improvement Amendments CLIA as qualified to perform high complexity clinical laboratory testing.    TRIMETH/SULFA Value in next row Sensitive      4 SENSITIVEThis is a modified FDA-approved test that has been validated and its performance characteristics determined by the reporting laboratory.  This laboratory is certified under the Clinical Laboratory Improvement Amendments CLIA as qualified to perform high complexity clinical laboratory testing.    AMPICILLIN/SULBACTAM Value in next row Sensitive      4 SENSITIVEThis is a modified FDA-approved test that has been validated and its performance characteristics determined by the reporting laboratory.  This laboratory is certified under the Clinical Laboratory Improvement Amendments CLIA as qualified to perform high complexity clinical laboratory testing.    PIP/TAZO Value in next row Sensitive      <=4 SENSITIVEThis is a modified FDA-approved test that has been validated and its performance characteristics  determined by the reporting laboratory.  This laboratory is certified under the Clinical Laboratory Improvement Amendments CLIA as qualified to perform high complexity clinical laboratory testing.    MEROPENEM Value in next row Sensitive      <=4 SENSITIVEThis is a modified FDA-approved test that has been validated and its performance characteristics determined by the reporting laboratory.  This laboratory is certified under the Clinical Laboratory Improvement Amendments CLIA as qualified to perform high complexity clinical laboratory testing.    * >=100,000 COLONIES/mL ESCHERICHIA COLI  Gastrointestinal Panel by PCR , Stool  Status: Abnormal   Collection Time: 06/13/24 11:41 PM   Specimen: Stool  Result Value Ref Range Status   Campylobacter species NOT DETECTED NOT DETECTED Final   Plesimonas shigelloides NOT DETECTED NOT DETECTED Final   Salmonella species NOT DETECTED NOT DETECTED Final   Yersinia enterocolitica NOT DETECTED NOT DETECTED Final   Vibrio species NOT DETECTED NOT DETECTED Final   Vibrio cholerae NOT DETECTED NOT DETECTED Final   Enteroaggregative E coli (EAEC) NOT DETECTED NOT DETECTED Final   Enteropathogenic E coli (EPEC) DETECTED (A) NOT DETECTED Final    Comment: RESULT CALLED TO, READ BACK BY AND VERIFIED WITH:  [facility] AT 0154 06/14/24 JG    Enterotoxigenic E coli (ETEC) NOT DETECTED NOT DETECTED Final   Shiga like toxin producing E coli (STEC) NOT DETECTED NOT DETECTED Final   Shigella/Enteroinvasive E coli (EIEC) NOT DETECTED NOT DETECTED Final   Cryptosporidium NOT DETECTED NOT DETECTED Final   Cyclospora cayetanensis NOT DETECTED NOT DETECTED Final   Entamoeba histolytica NOT DETECTED NOT DETECTED Final   Giardia lamblia NOT DETECTED NOT DETECTED Final   Adenovirus F40/41 NOT DETECTED NOT DETECTED Final   Astrovirus NOT DETECTED NOT DETECTED Final   Norovirus GI/GII NOT DETECTED NOT DETECTED Final   Rotavirus A NOT DETECTED NOT DETECTED Final    Sapovirus (I, II, IV, and V) NOT DETECTED NOT DETECTED Final    Comment: Performed at [facility], [address] [place], [place] [address]  C Difficile Quick Screen w PCR reflex     Status: Abnormal   Collection Time: 06/13/24 11:41 PM   Specimen: Stool  Result Value Ref Range Status   C Diff antigen POSITIVE (A) NEGATIVE Final   C Diff toxin NEGATIVE NEGATIVE Final   C Diff interpretation Results are indeterminate. See PCR results.  Final    Comment: Performed at [facility], [address] [place], [place] [address]  C. Diff by PCR, Reflexed     Status: None   Collection Time: 06/13/24 11:41 PM  Result Value Ref Range Status   Toxigenic C. Difficile by PCR NEGATIVE NEGATIVE Final    Comment: Patient is colonized with non toxigenic C. difficile. May not need treatment unless significant symptoms are present.   Hypervirulent Strain PRESUMPTIVE NEGATIVE PRESUMPTIVE NEGATIVE Final    Comment: Performed at [facility], [address] [place], [place] [address]     Labs: BNP (last 3 results) No results for input(s): BNP in the last 8760 hours. Basic Metabolic Panel: Recent Labs  Lab 06/17/24 0444 06/17/24 1030 06/18/24 0355 06/20/24 0447 06/21/24 1104  NA 135 138 137 137 134*  K 3.7 3.8 3.7 4.1 3.9  CL 104 101 103 104 105  CO2 22 20* 21* 22 20*  GLUCOSE 82 103* 64* 74 91  BUN 89* 90* 89* 86* 82*  CREATININE 5.66* 5.50* 5.54* 5.42* 5.68*  CALCIUM  7.5* 7.9* 8.0* 8.0* 8.2*   Liver Function Tests: No results for input(s): AST, ALT, ALKPHOS, BILITOT, PROT, ALBUMIN in the last 168 hours. No results for input(s): LIPASE, AMYLASE in the last 168 hours. No results for input(s): AMMONIA in the last 168 hours. CBC: Recent Labs  Lab 06/17/24 0444 06/17/24 1030 06/18/24 0355 06/18/24 2133 06/19/24 0317 06/19/24 1007 06/20/24 0447 06/21/24 1104  WBC 13.0* 11.8* 11.1*  --   --   --  9.9 9.8  NEUTROABS  --   --   --   --    --   --   --  6.5  HGB 7.2* 7.2* 8.2* 7.4* 7.0* 7.1* 8.0* 8.3*  HCT 21.9* 21.3* 24.4*  --   --   --  24.6* 25.5*  MCV 90.9 90.6 89.1  --   --   --  92.5 92.7  PLT 214 199 230  --   --   --  198 201   Cardiac Enzymes: No results for input(s): CKTOTAL, CKMB, CKMBINDEX, TROPONINI in the last 168 hours. BNP: Invalid input(s): POCBNP CBG: No results for input(s): GLUCAP in the last 168 hours. D-Dimer No results for input(s): DDIMER in the last 72 hours. Hgb A1c No results for input(s): HGBA1C in the last 72 hours. Lipid Profile No results for input(s): CHOL, HDL, LDLCALC, TRIG, CHOLHDL, LDLDIRECT in the last 72 hours. Thyroid  function studies No results for input(s): TSH, T4TOTAL, T3FREE, THYROIDAB in the last 72 hours.  Invalid input(s): FREET3 Anemia work up No results for input(s): VITAMINB12, FOLATE, FERRITIN, TIBC, IRON , RETICCTPCT in the last 72 hours. Urinalysis    Component Value Date/Time   COLORURINE AMBER (A) 06/13/2024 2149   APPEARANCEUR HAZY (A) 06/13/2024 2149   APPEARANCEUR Clear 04/04/2012 1438   LABSPEC 1.014 06/13/2024 2149   LABSPEC 1.004 04/04/2012 1438   PHURINE 5.0 06/13/2024 2149   GLUCOSEU NEGATIVE 06/13/2024 2149   GLUCOSEU Negative 04/04/2012 1438   HGBUR SMALL (A) 06/13/2024 2149   BILIRUBINUR NEGATIVE 06/13/2024 2149   BILIRUBINUR Negative 04/04/2012 1438   KETONESUR NEGATIVE 06/13/2024 2149   PROTEINUR 100 (A) 06/13/2024 2149   UROBILINOGEN 0.2 04/15/2008 0946   NITRITE NEGATIVE 06/13/2024 2149   LEUKOCYTESUR TRACE (A) 06/13/2024 2149   LEUKOCYTESUR Trace 04/04/2012 1438   Sepsis Labs Recent Labs  Lab 06/17/24 1030 06/18/24 0355 06/20/24 0447 06/21/24 1104  WBC 11.8* 11.1* 9.9 9.8   Microbiology Recent Results (from the past 240 hours)  Urine Culture (for pregnant, neutropenic or urologic patients or patients with an indwelling urinary catheter)     Status: Abnormal   Collection Time:  06/13/24  9:49 PM   Specimen: Urine, Catheterized  Result Value Ref Range Status   Specimen Description   Final    URINE, CATHETERIZED Performed at [facility], [address] [place] [address], [place], [place] [address]    Special Requests   Final    NONE Performed at [facility], [address] [place], [place] [address]    Culture >=100,000 COLONIES/mL ESCHERICHIA COLI (A)  Final   Report Status 06/15/2024 FINAL  Final   Organism ID, Bacteria ESCHERICHIA COLI (A)  Final      Susceptibility   Escherichia coli - MIC*    AMPICILLIN 4 SENSITIVE Sensitive     CEFAZOLIN  (URINE) Value in next row Sensitive      4 SENSITIVEThis is a modified FDA-approved test that has been validated and its performance characteristics determined by the reporting laboratory.  This laboratory is certified under the Clinical Laboratory Improvement Amendments CLIA as qualified to perform high complexity clinical laboratory testing.    CEFEPIME Value in next row Sensitive      4 SENSITIVEThis is a modified FDA-approved test that has been validated and its performance characteristics determined by the reporting laboratory.  This laboratory is certified under the Clinical Laboratory Improvement Amendments CLIA as qualified to perform high complexity clinical laboratory testing.    ERTAPENEM Value in next row Sensitive      4 SENSITIVEThis is a modified FDA-approved test that has been validated and its performance characteristics determined by the reporting laboratory.  This laboratory is certified under the Clinical Laboratory Improvement Amendments CLIA as qualified to perform high complexity clinical laboratory testing.    CEFTRIAXONE  Value in next row Sensitive      4 SENSITIVEThis is a modified FDA-approved test that has been validated and its performance characteristics determined by the reporting laboratory.  This laboratory is certified under the Clinical Laboratory Improvement Amendments CLIA as qualified  to perform high complexity clinical laboratory testing.    CIPROFLOXACIN Value in next row Sensitive      4 SENSITIVEThis is a modified FDA-approved test that has been validated and its performance characteristics determined by the reporting laboratory.  This laboratory is certified under the Clinical Laboratory Improvement Amendments CLIA as qualified to perform high complexity clinical laboratory testing.    GENTAMICIN Value in next row Sensitive      4 SENSITIVEThis is a modified FDA-approved test that has been validated and its performance characteristics determined by the reporting laboratory.  This laboratory is certified under the Clinical Laboratory Improvement Amendments CLIA as qualified to perform high complexity clinical laboratory testing.    NITROFURANTOIN Value in next row Sensitive      4 SENSITIVEThis is a modified FDA-approved test that has been validated and its performance characteristics determined by the reporting laboratory.  This laboratory is certified under the Clinical Laboratory Improvement Amendments CLIA as qualified to perform high complexity clinical laboratory testing.    TRIMETH/SULFA Value in next row Sensitive      4 SENSITIVEThis is a modified FDA-approved test that has been validated and its performance characteristics determined by the reporting laboratory.  This laboratory is certified under the Clinical Laboratory Improvement Amendments CLIA as qualified to perform high complexity clinical laboratory testing.    AMPICILLIN/SULBACTAM Value in next row Sensitive      4 SENSITIVEThis is a modified FDA-approved test that has been validated and its performance characteristics determined by the reporting laboratory.  This laboratory is certified under the Clinical Laboratory Improvement Amendments CLIA as qualified to perform high complexity clinical laboratory testing.    PIP/TAZO Value in next row Sensitive      <=4 SENSITIVEThis is a modified FDA-approved test that  has been validated and its performance characteristics determined by the reporting laboratory.  This laboratory is certified under the Clinical Laboratory Improvement Amendments CLIA as qualified to perform high complexity clinical laboratory testing.    MEROPENEM Value in next row Sensitive      <=4 SENSITIVEThis is a modified FDA-approved test that has been validated and its performance characteristics determined by the reporting laboratory.  This laboratory is certified under the Clinical Laboratory Improvement Amendments CLIA as qualified to perform high complexity clinical laboratory testing.    * >=100,000 COLONIES/mL ESCHERICHIA COLI  Gastrointestinal Panel by PCR , Stool     Status: Abnormal   Collection Time: 06/13/24 11:41 PM   Specimen: Stool  Result Value Ref Range Status   Campylobacter species NOT DETECTED NOT DETECTED Final   Plesimonas shigelloides NOT DETECTED NOT DETECTED Final   Salmonella species NOT DETECTED NOT DETECTED Final   Yersinia enterocolitica NOT DETECTED NOT DETECTED Final   Vibrio species NOT DETECTED NOT DETECTED Final   Vibrio cholerae NOT DETECTED NOT DETECTED Final   Enteroaggregative E coli (EAEC) NOT DETECTED NOT DETECTED Final   Enteropathogenic E coli (EPEC) DETECTED (A) NOT DETECTED Final    Comment: RESULT CALLED TO, READ BACK BY AND VERIFIED WITH:  [facility] AT 0154 06/14/24 JG    Enterotoxigenic  E coli (ETEC) NOT DETECTED NOT DETECTED Final   Shiga like toxin producing E coli (STEC) NOT DETECTED NOT DETECTED Final   Shigella/Enteroinvasive E coli (EIEC) NOT DETECTED NOT DETECTED Final   Cryptosporidium NOT DETECTED NOT DETECTED Final   Cyclospora cayetanensis NOT DETECTED NOT DETECTED Final   Entamoeba histolytica NOT DETECTED NOT DETECTED Final   Giardia lamblia NOT DETECTED NOT DETECTED Final   Adenovirus F40/41 NOT DETECTED NOT DETECTED Final   Astrovirus NOT DETECTED NOT DETECTED Final   Norovirus GI/GII NOT DETECTED NOT DETECTED Final    Rotavirus A NOT DETECTED NOT DETECTED Final   Sapovirus (I, II, IV, and V) NOT DETECTED NOT DETECTED Final    Comment: Performed at [facility], [address] [place], [place] [address]  C Difficile Quick Screen w PCR reflex     Status: Abnormal   Collection Time: 06/13/24 11:41 PM   Specimen: Stool  Result Value Ref Range Status   C Diff antigen POSITIVE (A) NEGATIVE Final   C Diff toxin NEGATIVE NEGATIVE Final   C Diff interpretation Results are indeterminate. See PCR results.  Final    Comment: Performed at [facility], [address] [place], [place] [address]  C. Diff by PCR, Reflexed     Status: None   Collection Time: 06/13/24 11:41 PM  Result Value Ref Range Status   Toxigenic C. Difficile by PCR NEGATIVE NEGATIVE Final    Comment: Patient is colonized with non toxigenic C. difficile. May not need treatment unless significant symptoms are present.   Hypervirulent Strain PRESUMPTIVE NEGATIVE PRESUMPTIVE NEGATIVE Final    Comment: Performed at [facility], [address] [place], [place] [address]     Time coordinating discharge: 50 minutes  SIGNED:   Calvin KATHEE Robson, MD  [facility] 06/22/2024, 3:06 PM Pager   If 7PM-7AM, please contact night-coverage

## 2024-07-02 ENCOUNTER — Ambulatory Visit (INDEPENDENT_AMBULATORY_CARE_PROVIDER_SITE_OTHER): Admitting: Family Medicine

## 2024-07-02 ENCOUNTER — Encounter: Payer: Self-pay | Admitting: Family Medicine

## 2024-07-02 VITALS — BP 158/96 | HR 64 | Resp 16 | Ht 63.0 in | Wt 154.2 lb

## 2024-07-02 DIAGNOSIS — Z9889 Other specified postprocedural states: Secondary | ICD-10-CM

## 2024-07-02 DIAGNOSIS — N185 Chronic kidney disease, stage 5: Secondary | ICD-10-CM

## 2024-07-02 DIAGNOSIS — E890 Postprocedural hypothyroidism: Secondary | ICD-10-CM

## 2024-07-02 DIAGNOSIS — N2581 Secondary hyperparathyroidism of renal origin: Secondary | ICD-10-CM | POA: Diagnosis not present

## 2024-07-02 DIAGNOSIS — I120 Hypertensive chronic kidney disease with stage 5 chronic kidney disease or end stage renal disease: Secondary | ICD-10-CM

## 2024-07-02 DIAGNOSIS — K922 Gastrointestinal hemorrhage, unspecified: Secondary | ICD-10-CM

## 2024-07-02 DIAGNOSIS — Z09 Encounter for follow-up examination after completed treatment for conditions other than malignant neoplasm: Secondary | ICD-10-CM

## 2024-07-02 DIAGNOSIS — I700 Atherosclerosis of aorta: Secondary | ICD-10-CM

## 2024-07-02 MED ORDER — LEVOTHYROXINE SODIUM 75 MCG PO TABS: 75.0000 ug | ORAL_TABLET | Freq: Every day | ORAL | 1 refills | Status: AC

## 2024-07-02 MED ORDER — LABETALOL HCL 300 MG PO TABS: 300.0000 mg | ORAL_TABLET | Freq: Two times a day (BID) | ORAL | 1 refills | Status: DC

## 2024-07-02 MED ORDER — EZETIMIBE 10 MG PO TABS
10.0000 mg | ORAL_TABLET | Freq: Every day | ORAL | 1 refills | Status: AC
Start: 2024-07-02 — End: ?

## 2024-07-02 NOTE — Progress Notes (Signed)
 Name: Betty Nielsen   MRN: [id number]    DOB: 1945/11/27   Date:07/02/2024       Progress Note  Subjective  Chief Complaint  Chief Complaint  Patient presents with   Medical Management of Chronic Issues   HPI   Admission date: 06/13/2024 Discharge date: 06/22/2024  Discharge diagnosis: Acute infective gastroenteritis, rectal bleed, acute anemia due to blood loss, acute on chronic kidney disease and metabolic acidosis and acute metabolic encephalopathy    Gastroenteritis E. 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 During hospital stay they discussed Palliative care , she also refused hospice and or SNIF upon discharge. 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 , on zetia  and statin therapy and denies side effects   Patient Active Problem List   Diagnosis Date Noted   Lower GI bleed 06/19/2024   Acute cystitis with hematuria 06/15/2024   Acute infective gastroenteritis 06/14/2024   Coronary artery disease 06/14/2024   Overweight (BMI 25.0-29.9) 06/14/2024   Acute metabolic encephalopathy 06/13/2024   Herniated nucleus pulposus, L2-3 (Right) 09/12/2023   Chronic anticoagulation (Plavix ) 08/17/2023   Hard of hearing 08/17/2023   Chronic low back pain (1ry area of Pain) (Bilateral) w/o sciatica 08/17/2023   Chronic lower extremity pain (2ry area of Pain) (Bilateral) 08/17/2023   Weakness of lower extremities (Bilateral) 08/17/2023   Chronic hip pain (3ry area of Pain) (Bilateral) 08/17/2023   Chronic knee pain (4th area of Pain) (Bilateral) 08/17/2023    Numbness and tingling of feet (Bilateral) 08/17/2023   Chronic neck pain (5th area of Pain) (Bilateral) 08/17/2023   Chronic shoulder pain (6th area of Pain) (Bilateral) 08/17/2023   Numbness and tingling in hands (intermittent) (Bilateral) 08/17/2023   Abnormal MRI, lumbar spine (06/25/2023) 08/16/2023   Chronic pain syndrome 08/16/2023   Pharmacologic therapy 08/16/2023   Disorder of skeletal system 08/16/2023   Problems influencing health status 08/16/2023   Spinal stenosis 06/26/2023   Hypertensive urgency 06/25/2023   Acute metabolic acidosis 06/25/2023   Inadequate pain control 06/25/2023   Benign hypertensive kidney disease with chronic kidney disease 03/09/2023   Proteinuria 03/09/2023   Prediabetes 07/06/2022   Essential hypertension 07/06/2022   Renal artery stenosis 07/14/2021   Acute renal failure superimposed on stage 4 chronic kidney disease (HCC)    Hyperkalemia    Accelerated hypertension 06/09/2021   Left upper extremity numbness 06/07/2021   Left facial numbness 06/07/2021   Elevated serum creatinine 06/07/2021   Anemia of chronic disease 11/21/2019   Hypo-osmolality and hyponatremia 11/21/2019   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/21/2019   Chronic kidney disease (CKD), stage V (HCC) 11/21/2019   History of CVA (cerebrovascular accident) 10/27/2019   Headache 10/27/2016   Hyperlipidemia 10/27/2016   History of non-ST elevation myocardial infarction (NSTEMI) 10/25/2016   History of coronary angioplasty 10/25/2016   BPV (benign positional vertigo) 06/15/2016   Atherosclerosis of abdominal aorta 11/21/2015   Acid reflux 06/05/2015   Benign secondary hypertension due to renal artery stenosis 06/05/2015   Hypothyroidism, 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   Major depression in remission 06/05/2015  Obesity (BMI 30.0-34.9) 06/05/2015   Neuropathy 06/05/2015   Perennial allergic rhinitis with seasonal variation 06/05/2015   Arthritis, degenerative 07/03/2014    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 06/26/2014    Past Surgical History:  Procedure Laterality Date   ABDOMINAL AORTIC ANEURYSM REPAIR     ABDOMINAL HYSTERECTOMY     APPENDECTOMY     CORONARY STENT INTERVENTION N/A 10/26/2016   Procedure: Coronary Stent Intervention;  Surgeon: Cara JONETTA Lovelace, MD;  Location: [facility];  Service: Cardiovascular;  Laterality: N/A;   HERNIA REPAIR     INNER EAR SURGERY     pt not sure of type   LEFT HEART CATH AND CORONARY ANGIOGRAPHY N/A 10/26/2016   Procedure: Left Heart Cath and Coronary Angiography;  Surgeon: Vinie DELENA Jude, MD;  Location: [facility];  Service: Cardiovascular;  Laterality: N/A;   RENAL ANGIOGRAPHY N/A 06/11/2021   Procedure: RENAL ANGIOGRAPHY;  Surgeon: Marea Selinda RAMAN, MD;  Location: [facility];  Service: Cardiovascular;  Laterality: N/A;   THYROIDECTOMY      Family History  Problem Relation Age of Onset   Healthy Mother    Cerebral aneurysm Father     Social History   Tobacco Use   Smoking status: Former    Current packs/day: 0.00    Average packs/day: 0.5 packs/day for 20.0 years (10.0 ttl pk-yrs)    Types: Cigarettes    Start date: 13    Quit date: 2000    Years since quitting: 25.8   Smokeless tobacco: Never   Tobacco comments:    smoking cessation materials not required  Substance Use Topics   Alcohol use: No    Alcohol/week: 0.0 standard drinks of alcohol     Current Outpatient Medications:    acetaminophen  (TYLENOL ) 650 MG CR tablet, Take 1 tablet (650 mg total) by mouth every 8 (eight) hours as needed for pain., Disp: 20 tablet, Rfl: 1   aspirin  EC 81 MG tablet, Take 81 mg by mouth daily. Swallow whole., Disp: , Rfl:    atorvastatin  (LIPITOR) 40 MG tablet, TAKE 1 TABLET BY MOUTH ONCE DAILY AT  6PM.  CHOLESTEROL  MEDICATION., Disp: 90 tablet, Rfl: 1   calcitRIOL  (ROCALTROL ) 0.25 MCG capsule, Take 0.25 mcg by mouth daily., Disp: , Rfl:     Cholecalciferol  125 MCG (5000 UT) TABS, Take 5,000 Units by mouth daily., Disp: , Rfl:    cloNIDine  (CATAPRES ) 0.2 MG tablet, Take 1 tablet (0.2 mg total) by mouth 2 (two) times daily., Disp: 90 tablet, Rfl: 1   clopidogrel  (PLAVIX ) 75 MG tablet, TAKE 1 TABLET BY MOUTH ONCE DAILY .  BLOOD  THINNER  TO  HELP  REDUCE  STROKE  RISK, Disp: 90 tablet, Rfl: 1   ezetimibe  (ZETIA ) 10 MG tablet, Take 1 tablet (10 mg total) by mouth daily., Disp: 90 tablet, Rfl: 1   labetalol  (NORMODYNE ) 300 MG tablet, Take 1 tablet (300 mg total) by mouth 2 (two) times daily., Disp: 180 tablet, Rfl: 1   latanoprost  (XALATAN ) 0.005 % ophthalmic solution, Place 1 drop into both eyes at bedtime., Disp: , Rfl:    levothyroxine  (SYNTHROID ) 75 MCG tablet, Take 1 tablet (75 mcg total) by mouth daily before breakfast., Disp: 90 tablet, Rfl: 1   senna-docusate (SENOKOT-S) 8.6-50 MG tablet, Take 1 tablet by mouth at bedtime as needed for mild constipation., Disp: 60 tablet, Rfl: 0  Allergies  Allergen Reactions   Ace Inhibitors Cough  Ciprofloxacin Other (See Comments)    unknown   Citalopram      hyponatremia   Hydrocodone -Acetaminophen  Other (See Comments)    unknown   Norvasc [Amlodipine Besylate]     Pruritus    Pantoprazole  Sodium Other (See Comments)    Cramps   Pravastatin Other (See Comments)    Stomach cramps   Sulfa Antibiotics     unknown   Clonidine  Other (See Comments)    Per patient told by allergist that would be best to stop but nephrologist recommended her to stay on medication     I personally reviewed active problem list, medication list, allergies with the patient/caregiver today.   ROS  Ten systems reviewed and is negative except as mentioned in HPI    Objective Physical Exam   Vitals:   07/02/24 1331  BP: (!) 158/96  Pulse: 64  Resp: 16  SpO2: 98%  Weight: 154 lb 3.2 oz (69.9 kg)  Height: 5' 3 (1.6 m)    Body mass index is 27.32 kg/m.  Recent Results (from the past 2160  hours)  Comprehensive metabolic panel     Status: Abnormal   Collection Time: 06/13/24  8:21 PM  Result Value Ref Range   Sodium 133 (L) 135 - 145 mmol/L   Potassium 4.2 3.5 - 5.1 mmol/L   Chloride 104 98 - 111 mmol/L   CO2 15 (L) 22 - 32 mmol/L   Glucose, Bld 114 (H) 70 - 99 mg/dL    Comment: Glucose reference range applies only to samples taken after fasting for at least 8 hours.   BUN 79 (H) 8 - 23 mg/dL   Creatinine, Ser 4.81 (H) 0.44 - 1.00 mg/dL   Calcium  8.6 (L) 8.9 - 10.3 mg/dL   Total Protein 6.0 (L) 6.5 - 8.1 g/dL   Albumin 3.1 (L) 3.5 - 5.0 g/dL   AST 26 15 - 41 U/L   ALT 10 0 - 44 U/L   Alkaline Phosphatase 57 38 - 126 U/L   Total Bilirubin 0.9 0.0 - 1.2 mg/dL   GFR, Estimated 8 (L) >60 mL/min    Comment: (NOTE) Calculated using the CKD-EPI Creatinine Equation (2021)    Anion gap 14 5 - 15    Comment: Performed at [facility], [address] [place], [place] [address]  CBC     Status: Abnormal   Collection Time: 06/13/24  8:21 PM  Result Value Ref Range   WBC 13.9 (H) 4.0 - 10.5 K/uL   RBC 2.61 (L) 3.87 - 5.11 MIL/uL   Hemoglobin 8.0 (L) 12.0 - 15.0 g/dL   HCT 74.3 (L) 63.9 - 53.9 %   MCV 98.1 80.0 - 100.0 fL   MCH 30.7 26.0 - 34.0 pg   MCHC 31.3 30.0 - 36.0 g/dL   RDW 86.2 88.4 - 84.4 %   Platelets 207 150 - 400 K/uL   nRBC 0.0 0.0 - 0.2 %    Comment: Performed at [facility], [address] [place], [place] [address]  CK     Status: None   Collection Time: 06/13/24  8:21 PM  Result Value Ref Range   Total CK 69 38 - 234 U/L    Comment: Performed at [facility], [address] [place], [place] [address]  TSH     Status: None   Collection Time: 06/13/24  8:21 PM  Result Value Ref Range   TSH 2.225 0.350 - 4.500 uIU/mL    Comment: Performed by  a 3rd Generation assay with a functional sensitivity of <=0.01 uIU/mL. Performed at [facility], [address] [place], [place] [address]   Magnesium     Status:  Abnormal   Collection Time: 06/13/24  8:21 PM  Result Value Ref Range   Magnesium 4.1 (H) 1.7 - 2.4 mg/dL    Comment: Performed at [facility], [address] [place], [place] [address]  CBG monitoring, ED     Status: Abnormal   Collection Time: 06/13/24  8:53 PM  Result Value Ref Range   Glucose-Capillary 107 (H) 70 - 99 mg/dL    Comment: Glucose reference range applies only to samples taken after fasting for at least 8 hours.  Urinalysis, Routine w reflex microscopic -Urine, Clean Catch     Status: Abnormal   Collection Time: 06/13/24  9:49 PM  Result Value Ref Range   Color, Urine AMBER (A) YELLOW    Comment: BIOCHEMICALS MAY BE AFFECTED BY COLOR   APPearance HAZY (A) CLEAR   Specific Gravity, Urine 1.014 1.005 - 1.030   pH 5.0 5.0 - 8.0   Glucose, UA NEGATIVE NEGATIVE mg/dL   Hgb urine dipstick SMALL (A) NEGATIVE   Bilirubin Urine NEGATIVE NEGATIVE   Ketones, ur NEGATIVE NEGATIVE mg/dL   Protein, ur 899 (A) NEGATIVE mg/dL   Nitrite NEGATIVE NEGATIVE   Leukocytes,Ua TRACE (A) NEGATIVE   RBC / HPF 11-20 0 - 5 RBC/hpf   WBC, UA 11-20 0 - 5 WBC/hpf   Bacteria, UA FEW (A) NONE SEEN   Squamous Epithelial / HPF 0-5 0 - 5 /HPF   Mucus PRESENT     Comment: Performed at [facility], [address], [place], [place] [address]  Urine Culture (for pregnant, neutropenic or urologic patients or patients with an indwelling urinary catheter)     Status: Abnormal   Collection Time: 06/13/24  9:49 PM   Specimen: Urine, Catheterized  Result Value Ref Range   Specimen Description      URINE, CATHETERIZED Performed at [facility], [address] [place], [place] [address]    Special Requests      NONE Performed at [facility], [address] [place], [place] [address]    Culture >=100,000 COLONIES/mL ESCHERICHIA COLI (A)    Report Status 06/15/2024 FINAL    Organism ID, Bacteria ESCHERICHIA COLI (A)       Susceptibility   Escherichia coli -  MIC*    AMPICILLIN 4 SENSITIVE Sensitive     CEFAZOLIN  (URINE) Value in next row Sensitive      4 SENSITIVEThis is a modified FDA-approved test that has been validated and its performance characteristics determined by the reporting laboratory.  This laboratory is certified under the Clinical Laboratory Improvement Amendments CLIA as qualified to perform high complexity clinical laboratory testing.    CEFEPIME Value in next row Sensitive      4 SENSITIVEThis is a modified FDA-approved test that has been validated and its performance characteristics determined by the reporting laboratory.  This laboratory is certified under the Clinical Laboratory Improvement Amendments CLIA as qualified to perform high complexity clinical laboratory testing.    ERTAPENEM Value in next row Sensitive      4 SENSITIVEThis is a modified FDA-approved test that has been validated and its performance characteristics determined by the reporting laboratory.  This laboratory is certified under the Clinical Laboratory Improvement Amendments CLIA as qualified to perform high complexity clinical laboratory testing.    CEFTRIAXONE  Value in  next row Sensitive      4 SENSITIVEThis is a modified FDA-approved test that has been validated and its performance characteristics determined by the reporting laboratory.  This laboratory is certified under the Clinical Laboratory Improvement Amendments CLIA as qualified to perform high complexity clinical laboratory testing.    CIPROFLOXACIN Value in next row Sensitive      4 SENSITIVEThis is a modified FDA-approved test that has been validated and its performance characteristics determined by the reporting laboratory.  This laboratory is certified under the Clinical Laboratory Improvement Amendments CLIA as qualified to perform high complexity clinical laboratory testing.    GENTAMICIN Value in next row Sensitive      4 SENSITIVEThis is a modified FDA-approved test that has been validated and its  performance characteristics determined by the reporting laboratory.  This laboratory is certified under the Clinical Laboratory Improvement Amendments CLIA as qualified to perform high complexity clinical laboratory testing.    NITROFURANTOIN Value in next row Sensitive      4 SENSITIVEThis is a modified FDA-approved test that has been validated and its performance characteristics determined by the reporting laboratory.  This laboratory is certified under the Clinical Laboratory Improvement Amendments CLIA as qualified to perform high complexity clinical laboratory testing.    TRIMETH/SULFA Value in next row Sensitive      4 SENSITIVEThis is a modified FDA-approved test that has been validated and its performance characteristics determined by the reporting laboratory.  This laboratory is certified under the Clinical Laboratory Improvement Amendments CLIA as qualified to perform high complexity clinical laboratory testing.    AMPICILLIN/SULBACTAM Value in next row Sensitive      4 SENSITIVEThis is a modified FDA-approved test that has been validated and its performance characteristics determined by the reporting laboratory.  This laboratory is certified under the Clinical Laboratory Improvement Amendments CLIA as qualified to perform high complexity clinical laboratory testing.    PIP/TAZO Value in next row Sensitive      <=4 SENSITIVEThis is a modified FDA-approved test that has been validated and its performance characteristics determined by the reporting laboratory.  This laboratory is certified under the Clinical Laboratory Improvement Amendments CLIA as qualified to perform high complexity clinical laboratory testing.    MEROPENEM Value in next row Sensitive      <=4 SENSITIVEThis is a modified FDA-approved test that has been validated and its performance characteristics determined by the reporting laboratory.  This laboratory is certified under the Clinical Laboratory Improvement Amendments CLIA as  qualified to perform high complexity clinical laboratory testing.    * >=100,000 COLONIES/mL ESCHERICHIA COLI  Gastrointestinal Panel by PCR , Stool     Status: Abnormal   Collection Time: 06/13/24 11:41 PM   Specimen: Stool  Result Value Ref Range   Campylobacter species NOT DETECTED NOT DETECTED   Plesimonas shigelloides NOT DETECTED NOT DETECTED   Salmonella species NOT DETECTED NOT DETECTED   Yersinia enterocolitica NOT DETECTED NOT DETECTED   Vibrio species NOT DETECTED NOT DETECTED   Vibrio cholerae NOT DETECTED NOT DETECTED   Enteroaggregative E coli (EAEC) NOT DETECTED NOT DETECTED   Enteropathogenic E coli (EPEC) DETECTED (A) NOT DETECTED    Comment: RESULT CALLED TO, READ BACK BY AND VERIFIED WITH:  MYRDA [facility] AT 0154 06/14/24 JG    Enterotoxigenic E coli (ETEC) NOT DETECTED NOT DETECTED   Shiga like toxin producing E coli (STEC) NOT DETECTED NOT DETECTED   Shigella/Enteroinvasive E coli (EIEC) NOT DETECTED NOT DETECTED   Cryptosporidium NOT DETECTED  NOT DETECTED   Cyclospora cayetanensis NOT DETECTED NOT DETECTED   Entamoeba histolytica NOT DETECTED NOT DETECTED   Giardia lamblia NOT DETECTED NOT DETECTED   Adenovirus F40/41 NOT DETECTED NOT DETECTED   Astrovirus NOT DETECTED NOT DETECTED   Norovirus GI/GII NOT DETECTED NOT DETECTED   Rotavirus A NOT DETECTED NOT DETECTED   Sapovirus (I, II, IV, and V) NOT DETECTED NOT DETECTED    Comment: Performed at [facility], [address] [place], [place] [address]  C Difficile Quick Screen w PCR reflex     Status: Abnormal   Collection Time: 06/13/24 11:41 PM   Specimen: Stool  Result Value Ref Range   C Diff antigen POSITIVE (A) NEGATIVE   C Diff toxin NEGATIVE NEGATIVE   C Diff interpretation Results are indeterminate. See PCR results.     Comment: Performed at [facility], [address] [place], [place] [address]  C. Diff by PCR, Reflexed     Status: None   Collection Time: 06/13/24 11:41 PM   Result Value Ref Range   Toxigenic C. Difficile by PCR NEGATIVE NEGATIVE    Comment: Patient is colonized with non toxigenic C. difficile. May not need treatment unless significant symptoms are present.   Hypervirulent Strain PRESUMPTIVE NEGATIVE PRESUMPTIVE NEGATIVE    Comment: Performed at [facility], [address] [place], [place] [address]  Comprehensive metabolic panel     Status: Abnormal   Collection Time: 06/14/24  4:58 AM  Result Value Ref Range   Sodium 137 135 - 145 mmol/L   Potassium 3.4 (L) 3.5 - 5.1 mmol/L   Chloride 106 98 - 111 mmol/L   CO2 15 (L) 22 - 32 mmol/L   Glucose, Bld 149 (H) 70 - 99 mg/dL    Comment: Glucose reference range applies only to samples taken after fasting for at least 8 hours.   BUN 80 (H) 8 - 23 mg/dL   Creatinine, Ser 4.94 (H) 0.44 - 1.00 mg/dL   Calcium  7.8 (L) 8.9 - 10.3 mg/dL   Total Protein 5.2 (L) 6.5 - 8.1 g/dL   Albumin 2.7 (L) 3.5 - 5.0 g/dL   AST 23 15 - 41 U/L   ALT 8 0 - 44 U/L   Alkaline Phosphatase 49 38 - 126 U/L   Total Bilirubin 0.7 0.0 - 1.2 mg/dL   GFR, Estimated 8 (L) >60 mL/min    Comment: (NOTE) Calculated using the CKD-EPI Creatinine Equation (2021)    Anion gap 16 (H) 5 - 15    Comment: Performed at [facility], [address] [place], [place] [address]  CBC     Status: Abnormal   Collection Time: 06/14/24  4:58 AM  Result Value Ref Range   WBC 15.4 (H) 4.0 - 10.5 K/uL   RBC 2.32 (L) 3.87 - 5.11 MIL/uL   Hemoglobin 7.0 (L) 12.0 - 15.0 g/dL   HCT 77.7 (L) 63.9 - 53.9 %   MCV 95.7 80.0 - 100.0 fL   MCH 30.2 26.0 - 34.0 pg   MCHC 31.5 30.0 - 36.0 g/dL   RDW 86.2 88.4 - 84.4 %   Platelets 194 150 - 400 K/uL   nRBC 0.0 0.0 - 0.2 %    Comment: Performed at [facility], [address], [place], [place] [address]  Type and screen [facility]     Status: None   Collection Time: 06/15/24  4:53 AM  Result Value Ref Range  ABO/RH(D) AB POS    Antibody Screen  NEG    Sample Expiration 06/18/2024,2359    Unit Number [id number]    Blood Component Type RED CELLS,LR    Unit division 00    Status of Unit ISSUED,FINAL    Transfusion Status OK TO TRANSFUSE    Crossmatch Result Compatible    Unit Number [id number]    Blood Component Type RED CELLS,LR    Unit division 00    Status of Unit ISSUED,FINAL    Transfusion Status OK TO TRANSFUSE    Crossmatch Result      Compatible Performed at [facility], [address] [place], [place] [address]   CBC     Status: Abnormal   Collection Time: 06/15/24  4:53 AM  Result Value Ref Range   WBC 12.3 (H) 4.0 - 10.5 K/uL   RBC 2.39 (L) 3.87 - 5.11 MIL/uL   Hemoglobin 7.2 (L) 12.0 - 15.0 g/dL   HCT 77.9 (L) 63.9 - 53.9 %   MCV 92.1 80.0 - 100.0 fL   MCH 30.1 26.0 - 34.0 pg   MCHC 32.7 30.0 - 36.0 g/dL   RDW 86.5 88.4 - 84.4 %   Platelets 210 150 - 400 K/uL   nRBC 0.0 0.0 - 0.2 %    Comment: Performed at [facility], [address] [place], [place] [address]  Basic metabolic panel     Status: Abnormal   Collection Time: 06/15/24  4:53 AM  Result Value Ref Range   Sodium 137 135 - 145 mmol/L   Potassium 3.2 (L) 3.5 - 5.1 mmol/L   Chloride 103 98 - 111 mmol/L   CO2 19 (L) 22 - 32 mmol/L   Glucose, Bld 128 (H) 70 - 99 mg/dL    Comment: Glucose reference range applies only to samples taken after fasting for at least 8 hours.   BUN 93 (H) 8 - 23 mg/dL   Creatinine, Ser 4.93 (H) 0.44 - 1.00 mg/dL   Calcium  7.6 (L) 8.9 - 10.3 mg/dL   GFR, Estimated 8 (L) >60 mL/min    Comment: (NOTE) Calculated using the CKD-EPI Creatinine Equation (2021)    Anion gap 15 5 - 15    Comment: Performed at [facility], [address] [place], [place] [address]  BPAM RBC     Status: None   Collection Time: 06/15/24  4:53 AM  Result Value Ref Range   ISSUE DATE / TIME [id number]    Blood Product Unit Number [id number]    PRODUCT CODE E0382V00    Unit Type and Rh 6200     Blood Product Expiration Date 202510252359    ISSUE DATE / TIME [id number]    Blood Product Unit Number [id number]    PRODUCT CODE E0382V00    Unit Type and Rh 6200    Blood Product Expiration Date [id number]   CBC     Status: Abnormal   Collection Time: 06/16/24  3:50 AM  Result Value Ref Range   WBC 11.6 (H) 4.0 - 10.5 K/uL   RBC 2.08 (L) 3.87 - 5.11 MIL/uL   Hemoglobin 6.3 (L) 12.0 - 15.0 g/dL   HCT 80.9 (L) 63.9 - 53.9 %   MCV 91.3 80.0 - 100.0 fL   MCH 30.3 26.0 - 34.0 pg   MCHC 33.2 30.0 - 36.0 g/dL   RDW 86.2 88.4 - 84.4 %   Platelets 210 150 - 400 K/uL   nRBC 0.0 0.0 - 0.2 %  Comment: Performed at [facility], [address] [place], [place] [address]  Basic metabolic panel     Status: Abnormal   Collection Time: 06/16/24  3:50 AM  Result Value Ref Range   Sodium 136 135 - 145 mmol/L   Potassium 3.4 (L) 3.5 - 5.1 mmol/L   Chloride 103 98 - 111 mmol/L   CO2 23 22 - 32 mmol/L   Glucose, Bld 91 70 - 99 mg/dL    Comment: Glucose reference range applies only to samples taken after fasting for at least 8 hours.   BUN 89 (H) 8 - 23 mg/dL   Creatinine, Ser 4.73 (H) 0.44 - 1.00 mg/dL   Calcium  7.7 (L) 8.9 - 10.3 mg/dL   GFR, Estimated 8 (L) >60 mL/min    Comment: (NOTE) Calculated using the CKD-EPI Creatinine Equation (2021)    Anion gap 10 5 - 15    Comment: Performed at [facility], [address] [place], [place] [address]  Prepare RBC (crossmatch)     Status: None   Collection Time: 06/16/24  9:00 AM  Result Value Ref Range   Order Confirmation      ORDER PROCESSED BY BLOOD BANK Performed at [facility], [address], [place], [place] [address]   Basic metabolic panel     Status: Abnormal   Collection Time: 06/17/24  4:44 AM  Result Value Ref Range   Sodium 135 135 - 145 mmol/L   Potassium 3.7 3.5 - 5.1 mmol/L   Chloride 104 98 - 111 mmol/L   CO2 22 22 - 32 mmol/L   Glucose, Bld 82 70 - 99 mg/dL    Comment: Glucose  reference range applies only to samples taken after fasting for at least 8 hours.   BUN 89 (H) 8 - 23 mg/dL   Creatinine, Ser 4.33 (H) 0.44 - 1.00 mg/dL   Calcium  7.5 (L) 8.9 - 10.3 mg/dL   GFR, Estimated 7 (L) >60 mL/min    Comment: (NOTE) Calculated using the CKD-EPI Creatinine Equation (2021)    Anion gap 9 5 - 15    Comment: Performed at [facility], [address] [place], [place] [address]  CBC     Status: Abnormal   Collection Time: 06/17/24  4:44 AM  Result Value Ref Range   WBC 13.0 (H) 4.0 - 10.5 K/uL   RBC 2.41 (L) 3.87 - 5.11 MIL/uL   Hemoglobin 7.2 (L) 12.0 - 15.0 g/dL   HCT 78.0 (L) 63.9 - 53.9 %   MCV 90.9 80.0 - 100.0 fL   MCH 29.9 26.0 - 34.0 pg   MCHC 32.9 30.0 - 36.0 g/dL   RDW 85.2 88.4 - 84.4 %   Platelets 214 150 - 400 K/uL   nRBC 0.0 0.0 - 0.2 %    Comment: Performed at [facility], [address], [place], [place] [address]  Prepare RBC (crossmatch)     Status: None   Collection Time: 06/17/24  8:22 AM  Result Value Ref Range   Order Confirmation      ORDER PROCESSED BY BLOOD BANK Performed at [facility], [address], [place], [place] [address]   Basic metabolic panel     Status: Abnormal   Collection Time: 06/17/24 10:30 AM  Result Value Ref Range   Sodium 138 135 - 145 mmol/L   Potassium 3.8 3.5 - 5.1 mmol/L   Chloride 101 98 - 111 mmol/L   CO2 20 (L) 22 -  32 mmol/L   Glucose, Bld 103 (H) 70 - 99 mg/dL    Comment: Glucose reference range applies only to samples taken after fasting for at least 8 hours.   BUN 90 (H) 8 - 23 mg/dL   Creatinine, Ser 4.49 (H) 0.44 - 1.00 mg/dL   Calcium  7.9 (L) 8.9 - 10.3 mg/dL   GFR, Estimated 7 (L) >60 mL/min    Comment: (NOTE) Calculated using the CKD-EPI Creatinine Equation (2021)    Anion gap 17 (H) 5 - 15    Comment: Performed at [facility], [address] [place], [place] [address]  CBC     Status: Abnormal   Collection Time: 06/17/24 10:30 AM  Result  Value Ref Range   WBC 11.8 (H) 4.0 - 10.5 K/uL   RBC 2.35 (L) 3.87 - 5.11 MIL/uL   Hemoglobin 7.2 (L) 12.0 - 15.0 g/dL   HCT 78.6 (L) 63.9 - 53.9 %   MCV 90.6 80.0 - 100.0 fL   MCH 30.6 26.0 - 34.0 pg   MCHC 33.8 30.0 - 36.0 g/dL   RDW 85.2 88.4 - 84.4 %   Platelets 199 150 - 400 K/uL   nRBC 0.0 0.0 - 0.2 %    Comment: Performed at [facility], [address] [place], [place] [address]  CBC     Status: Abnormal   Collection Time: 06/18/24  3:55 AM  Result Value Ref Range   WBC 11.1 (H) 4.0 - 10.5 K/uL   RBC 2.74 (L) 3.87 - 5.11 MIL/uL   Hemoglobin 8.2 (L) 12.0 - 15.0 g/dL   HCT 75.5 (L) 63.9 - 53.9 %   MCV 89.1 80.0 - 100.0 fL   MCH 29.9 26.0 - 34.0 pg   MCHC 33.6 30.0 - 36.0 g/dL   RDW 84.5 88.4 - 84.4 %   Platelets 230 150 - 400 K/uL   nRBC 0.0 0.0 - 0.2 %    Comment: Performed at [facility], [address] [place], [place] [address]  Basic metabolic panel     Status: Abnormal   Collection Time: 06/18/24  3:55 AM  Result Value Ref Range   Sodium 137 135 - 145 mmol/L   Potassium 3.7 3.5 - 5.1 mmol/L   Chloride 103 98 - 111 mmol/L   CO2 21 (L) 22 - 32 mmol/L   Glucose, Bld 64 (L) 70 - 99 mg/dL    Comment: Glucose reference range applies only to samples taken after fasting for at least 8 hours.   BUN 89 (H) 8 - 23 mg/dL   Creatinine, Ser 4.45 (H) 0.44 - 1.00 mg/dL   Calcium  8.0 (L) 8.9 - 10.3 mg/dL   GFR, Estimated 7 (L) >60 mL/min    Comment: (NOTE) Calculated using the CKD-EPI Creatinine Equation (2021)    Anion gap 13 5 - 15    Comment: Performed at [facility], [address] [place], [place] [address]  Hemoglobin     Status: Abnormal   Collection Time: 06/18/24  9:33 PM  Result Value Ref Range   Hemoglobin 7.4 (L) 12.0 - 15.0 g/dL    Comment: Performed at [facility], [address] [place], [place] [address]  Hemoglobin     Status: Abnormal   Collection Time: 06/19/24  3:17 AM  Result Value Ref Range    Hemoglobin 7.0 (L) 12.0 - 15.0 g/dL    Comment: Performed at [facility], [address], [place], [place] [address]  Prepare  RBC (crossmatch)     Status: None   Collection Time: 06/19/24  8:30 AM  Result Value Ref Range   Order Confirmation      ORDER PROCESSED BY BLOOD BANK Performed at [facility], [address] [place], [place] [address]   Hemoglobin     Status: Abnormal   Collection Time: 06/19/24 10:07 AM  Result Value Ref Range   Hemoglobin 7.1 (L) 12.0 - 15.0 g/dL    Comment: Performed at [facility], [address] [place], [place] [address]  Type and screen [facility]     Status: None   Collection Time: 06/19/24 10:07 AM  Result Value Ref Range   ABO/RH(D) AB POS    Antibody Screen NEG    Sample Expiration 06/22/2024,2359    Unit Number [id number]    Blood Component Type RBC LR PHER2    Unit division 00    Status of Unit ISSUED,FINAL    Transfusion Status OK TO TRANSFUSE    Crossmatch Result      Compatible Performed at [facility], [address] [place], [place] [address]   BPAM RBC     Status: None   Collection Time: 06/19/24 10:07 AM  Result Value Ref Range   ISSUE DATE / TIME [id number]    Blood Product Unit Number [id number]    PRODUCT CODE [id number]    Unit Type and Rh 6200    Blood Product Expiration Date [id number]   CBC     Status: Abnormal   Collection Time: 06/20/24  4:47 AM  Result Value Ref Range   WBC 9.9 4.0 - 10.5 K/uL   RBC 2.66 (L) 3.87 - 5.11 MIL/uL   Hemoglobin 8.0 (L) 12.0 - 15.0 g/dL   HCT 75.3 (L) 63.9 - 53.9 %   MCV 92.5 80.0 - 100.0 fL   MCH 30.1 26.0 - 34.0 pg   MCHC 32.5 30.0 - 36.0 g/dL   RDW 84.5 88.4 - 84.4 %   Platelets 198 150 - 400 K/uL   nRBC 0.0 0.0 - 0.2 %    Comment: Performed at [facility], [address] [place], [place] [address]  Basic metabolic panel     Status: Abnormal   Collection Time: 06/20/24  4:47 AM  Result Value  Ref Range   Sodium 137 135 - 145 mmol/L   Potassium 4.1 3.5 - 5.1 mmol/L   Chloride 104 98 - 111 mmol/L   CO2 22 22 - 32 mmol/L   Glucose, Bld 74 70 - 99 mg/dL    Comment: Glucose reference range applies only to samples taken after fasting for at least 8 hours.   BUN 86 (H) 8 - 23 mg/dL   Creatinine, Ser 4.57 (H) 0.44 - 1.00 mg/dL   Calcium  8.0 (L) 8.9 - 10.3 mg/dL   GFR, Estimated 8 (L) >60 mL/min    Comment: (NOTE) Calculated using the CKD-EPI Creatinine Equation (2021)    Anion gap 11 5 - 15    Comment: Performed at [facility], [address] [place], [place] [address]  CBC with Differential/Platelet     Status: Abnormal   Collection Time: 06/21/24 11:04 AM  Result Value Ref Range   WBC 9.8 4.0 - 10.5 K/uL   RBC 2.75 (L) 3.87 - 5.11 MIL/uL   Hemoglobin 8.3 (L) 12.0 - 15.0 g/dL   HCT 74.4 (L) 63.9 - 53.9 %   MCV 92.7 80.0 - 100.0 fL   MCH  30.2 26.0 - 34.0 pg   MCHC 32.5 30.0 - 36.0 g/dL   RDW 85.0 88.4 - 84.4 %   Platelets 201 150 - 400 K/uL   nRBC 0.0 0.0 - 0.2 %   Neutrophils Relative % 67 %   Neutro Abs 6.5 1.7 - 7.7 K/uL   Lymphocytes Relative 14 %   Lymphs Abs 1.4 0.7 - 4.0 K/uL   Monocytes Relative 8 %   Monocytes Absolute 0.8 0.1 - 1.0 K/uL   Eosinophils Relative 8 %   Eosinophils Absolute 0.8 (H) 0.0 - 0.5 K/uL   Basophils Relative 0 %   Basophils Absolute 0.0 0.0 - 0.1 K/uL   Immature Granulocytes 3 %   Abs Immature Granulocytes 0.28 (H) 0.00 - 0.07 K/uL    Comment: Performed at [facility], [address], [place], [place] [address]  Basic metabolic panel     Status: Abnormal   Collection Time: 06/21/24 11:04 AM  Result Value Ref Range   Sodium 134 (L) 135 - 145 mmol/L   Potassium 3.9 3.5 - 5.1 mmol/L   Chloride 105 98 - 111 mmol/L   CO2 20 (L) 22 - 32 mmol/L   Glucose, Bld 91 70 - 99 mg/dL    Comment: Glucose reference range applies only to samples taken after fasting for at least 8 hours.   BUN 82 (H) 8 - 23 mg/dL   Creatinine,  Ser 4.31 (H) 0.44 - 1.00 mg/dL   Calcium  8.2 (L) 8.9 - 10.3 mg/dL   GFR, Estimated 7 (L) >60 mL/min    Comment: (NOTE) Calculated using the CKD-EPI Creatinine Equation (2021)    Anion gap 9 5 - 15    Comment: Performed at [facility], [address] [place], [place] [address]    Diabetic Foot Exam:     PHQ2/9:    07/02/2024    1:26 PM 12/30/2023    1:32 PM 09/12/2023   10:27 AM 09/01/2023   10:20 AM 07/08/2023    1:22 PM  Depression screen PHQ 2/9  Decreased Interest 0 0 0 0 1  Down, Depressed, Hopeless 0 0 0 0 1  PHQ - 2 Score 0 0 0 0 2  Altered sleeping 0 0  0 0  Tired, decreased energy 0 0  0 1  Change in appetite 0 0  0 0  Feeling bad or failure about yourself  0 0  0 0  Trouble concentrating 0 0  0 0  Moving slowly or fidgety/restless 0 0  0 0  Suicidal thoughts 0 0  0 0  PHQ-9 Score 0 0  0 3  Difficult doing work/chores Not difficult at all Not difficult at all  Not difficult at all Not difficult at all    phq 9 is negative  Fall Risk:    07/02/2024    1:26 PM 12/30/2023    1:28 PM 09/12/2023   10:27 AM 09/01/2023   10:24 AM 08/17/2023   10:05 AM  Fall Risk   Falls in the past year? 0 1 0 1 1  Number falls in past yr: 0 1  1 0  Injury with Fall? 0 0  1 1  Comment     hurt coccyx  Risk for fall due to : No Fall Risks Impaired balance/gait  History of fall(s)   Follow up Falls evaluation completed Falls prevention discussed;Education provided;Falls evaluation completed  Falls evaluation completed;Falls prevention discussed       Assessment & Plan  1. Hospital discharge follow-up (Primary)  - CBC with Differential/Platelet - Comprehensive metabolic panel with GFR  2. Secondary hyperparathyroidism of renal origin  Under the care of nephrologist   3. CKD (chronic kidney disease), stage V (HCC)  - Comprehensive metabolic panel with GFR  4. 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  Dispense: 180 tablet; Refill: 1  5. Lower GI bleed  - CBC with Differential/Platelet  6. Atherosclerosis of abdominal aorta  - Lipid panel  7. Hypothyroidism, postsurgical  - levothyroxine  (SYNTHROID ) 75 MCG tablet; Take 1 tablet (75 mcg total) by mouth daily before breakfast.  Dispense: 90 tablet; Refill: 1  8. History of stent insertion of renal artery  - ezetimibe  (ZETIA ) 10 MG tablet; Take 1 tablet (10 mg total) by mouth daily.  Dispense: 90 tablet; Refill: 1

## 2024-07-29 ENCOUNTER — Other Ambulatory Visit: Payer: Self-pay | Admitting: Family Medicine

## 2024-07-31 ENCOUNTER — Telehealth: Payer: Self-pay

## 2024-07-31 NOTE — Telephone Encounter (Unsigned)
 Copied from CRM [id number]. Topic: Clinical - Prescription Issue >> Jul 31, 2024  4:14 PM Betty Nielsen wrote: Reason for CRM: patient is calling in regards to cloNIDine  (CATAPRES ) 0.2 MG tablet. Looks like it was denied but patient is out of medication. Is there anyway the prescription can be sent to pharmacy?  Please FU w patient, [phone]

## 2024-08-01 MED ORDER — CLONIDINE HCL 0.2 MG PO TABS: 0.2000 mg | ORAL_TABLET | Freq: Two times a day (BID) | ORAL | 1 refills | Status: DC

## 2024-08-06 ENCOUNTER — Other Ambulatory Visit: Payer: Self-pay

## 2024-08-06 ENCOUNTER — Telehealth: Payer: Self-pay

## 2024-08-06 MED ORDER — CLONIDINE HCL 0.2 MG PO TABS: 0.2000 mg | ORAL_TABLET | Freq: Two times a day (BID) | ORAL | 1 refills | Status: AC

## 2024-08-06 NOTE — Telephone Encounter (Signed)
 Copied from CRM [id number]. Topic: General - Other >> Aug 01, 2024  3:34 PM Santiya F wrote: Reason for CRM: Patient is calling in requesting to speak with someone in the office. Patient says someone was supposed to call her back yesterday, and she still hasn't heard from anyone. Please follow up with patient. >> Aug 06, 2024 10:03 AM Emylou G wrote: Please call patient back.. - she said is out of medication.. She said been waiting to hear from us ?  Pls advise

## 2024-08-06 NOTE — Telephone Encounter (Signed)
 Called patient and informed the prescription was sent, but it had printed. Refill re-sent electronically.

## 2024-08-13 ENCOUNTER — Other Ambulatory Visit: Payer: Self-pay

## 2024-08-13 ENCOUNTER — Emergency Department

## 2024-08-13 ENCOUNTER — Emergency Department
Admission: EM | Admit: 2024-08-13 | Discharge: 2024-08-14 | Disposition: A | Discharge status: Home / Self Care | Attending: Emergency Medicine | Admitting: Emergency Medicine

## 2024-08-13 DIAGNOSIS — I6782 Cerebral ischemia: Secondary | ICD-10-CM | POA: Diagnosis not present

## 2024-08-13 DIAGNOSIS — N189 Chronic kidney disease, unspecified: Secondary | ICD-10-CM | POA: Diagnosis not present

## 2024-08-13 DIAGNOSIS — R531 Weakness: Secondary | ICD-10-CM

## 2024-08-13 DIAGNOSIS — D649 Anemia, unspecified: Secondary | ICD-10-CM | POA: Diagnosis not present

## 2024-08-13 DIAGNOSIS — R197 Diarrhea, unspecified: Secondary | ICD-10-CM | POA: Diagnosis present

## 2024-08-13 DIAGNOSIS — G319 Degenerative disease of nervous system, unspecified: Secondary | ICD-10-CM | POA: Insufficient documentation

## 2024-08-13 DIAGNOSIS — I129 Hypertensive chronic kidney disease with stage 1 through stage 4 chronic kidney disease, or unspecified chronic kidney disease: Secondary | ICD-10-CM | POA: Diagnosis not present

## 2024-08-13 LAB — COMPREHENSIVE METABOLIC PANEL WITH GFR
ALT: 7 U/L (ref 0–44)
AST: 15 U/L (ref 15–41)
Albumin: 3.6 g/dL (ref 3.5–5.0)
Alkaline Phosphatase: 69 U/L (ref 38–126)
Anion gap: 15 (ref 5–15)
BUN: 65 mg/dL — ABNORMAL HIGH (ref 8–23)
CO2: 15 mmol/L — ABNORMAL LOW (ref 22–32)
Calcium: 8.9 mg/dL (ref 8.9–10.3)
Chloride: 105 mmol/L (ref 98–111)
Creatinine, Ser: 6.27 mg/dL — ABNORMAL HIGH (ref 0.44–1.00)
GFR, Estimated: 6 mL/min — ABNORMAL LOW (ref >60)
Glucose, Bld: 99 mg/dL (ref 70–99)
Potassium: 4.5 mmol/L (ref 3.5–5.1)
Sodium: 135 mmol/L (ref 135–145)
Total Bilirubin: 0.2 mg/dL (ref 0.0–1.2)
Total Protein: 5.9 g/dL — ABNORMAL LOW (ref 6.5–8.1)

## 2024-08-13 LAB — URINALYSIS, ROUTINE W REFLEX MICROSCOPIC
Bilirubin Urine: NEGATIVE
Glucose, UA: NEGATIVE mg/dL
Ketones, ur: NEGATIVE mg/dL
Nitrite: NEGATIVE
Protein, ur: 100 mg/dL — AB
Specific Gravity, Urine: 1.015 (ref 1.005–1.030)
pH: 5 (ref 5.0–8.0)

## 2024-08-13 LAB — CBC
HCT: 22.7 % — ABNORMAL LOW (ref 36.0–46.0)
Hemoglobin: 7.2 g/dL — ABNORMAL LOW (ref 12.0–15.0)
MCH: 30.3 pg (ref 26.0–34.0)
MCHC: 31.7 g/dL (ref 30.0–36.0)
MCV: 95.4 fL (ref 80.0–100.0)
Platelets: 189 K/uL (ref 150–400)
RBC: 2.38 MIL/uL — ABNORMAL LOW (ref 3.87–5.11)
RDW: 14.8 % (ref 11.5–15.5)
WBC: 8 K/uL (ref 4.0–10.5)
nRBC: 0 % (ref 0.0–0.2)

## 2024-08-13 MED ORDER — LOPERAMIDE HCL 2 MG PO CAPS: 4.0000 mg | ORAL_CAPSULE | Freq: Once | ORAL | Status: DC

## 2024-08-13 MED ORDER — FOSFOMYCIN TROMETHAMINE 3 G PO PACK
3.0000 g | PACK | Freq: Once | ORAL | Status: AC
  Administered 2024-08-13: 3 g via ORAL
  Filled 2024-08-13: qty 3

## 2024-08-13 MED ORDER — CLONIDINE HCL 0.1 MG PO TABS
0.2000 mg | ORAL_TABLET | Freq: Once | ORAL | Status: AC
  Administered 2024-08-14: 0.2 mg via ORAL
  Filled 2024-08-13: qty 2

## 2024-08-13 MED ORDER — SODIUM CHLORIDE 0.9 % IV BOLUS: 500.0000 mL | Freq: Once | INTRAVENOUS | Status: DC

## 2024-08-13 MED ORDER — DIPHENOXYLATE-ATROPINE 2.5-0.025 MG PO TABS
2.0000 | ORAL_TABLET | Freq: Once | ORAL | Status: AC
Start: 2024-08-13 — End: 2024-08-13
  Administered 2024-08-13: 2 via ORAL
  Filled 2024-08-13: qty 2

## 2024-08-13 MED ORDER — SODIUM CHLORIDE 0.9 % IV BOLUS: 1000.0000 mL | Freq: Once | INTRAVENOUS | Status: DC

## 2024-08-13 MED ORDER — DIPHENOXYLATE-ATROPINE 2.5-0.025 MG PO TABS: 1.0000 | ORAL_TABLET | Freq: Four times a day (QID) | ORAL | 0 refills | Status: AC | PRN

## 2024-08-13 NOTE — Discharge Instructions (Addendum)
 As we discussed please drink plenty fluids over the next 2 days.  Please follow-up with your doctor within the next 1 to 2 days for recheck/reevaluation.

## 2024-08-13 NOTE — ED Triage Notes (Signed)
 Son states she brought mother to hospital for increased weakness and he feels like she has right sided facial droop. Patient was last known well on Thursday. Son also states that patient has CKD and was told she needs dialysis but refuses treatment.

## 2024-08-13 NOTE — ED Provider Notes (Signed)
 [facility] Provider Note    Event Date/Time   First MD Initiated Contact with Patient 08/13/24 2153     (approximate)  History   Chief Complaint: Weakness  HPI  Betty Nielsen is a 78 y.o. female with a past medical history of CKD, hypertension, hyperlipidemia, presents to the emergency department for weakness and diarrhea.  Patient has chronic kidney disease, per report and patient she has refused dialysis in the past and continues to refuse dialysis.  Somewhat concerned over possible increased weakness and possible slight right facial droop.  Patient denies this and states she only came to the emergency department due to diarrhea.  States in September she was admitted for diarrhea.  I reviewed that admission at that time patient was confused with encephalopathy and an E. coli infection.  Patient states she has had a few episodes of diarrhea today but wanted to come before it got worse.  She tried Imodium  at home without relief so she came to the emergency department.  Patient denies any fever.  Denies any nausea or vomiting.  Patient is awake alert and oriented.  Physical Exam   Triage Vital Signs: ED Triage Vitals [08/13/24 1758]  Encounter Vitals Group     BP (!) 192/82     Girls Systolic BP Percentile      Girls Diastolic BP Percentile      Boys Systolic BP Percentile      Boys Diastolic BP Percentile      Pulse Rate 71     Resp 18     Temp 98.1 F (36.7 C)     Temp Source Oral     SpO2 100 %     Weight      Height      Head Circumference      Peak Flow      Pain Score 0     Pain Loc      Pain Education      Exclude from Growth Chart     Most recent vital signs: Vitals:   08/13/24 1758 08/13/24 2235  BP: (!) 192/82 (!) 208/83  Pulse: 71 75  Resp: 18 16  Temp: 98.1 F (36.7 C) 98 F (36.7 C)  SpO2: 100% 100%    General: Awake, no distress.  CV:  Good peripheral perfusion.  Regular rate and rhythm  Resp:  Normal effort.  Equal  breath sounds bilaterally.  Abd:  No distention.  Soft, nontender.  No rebound or guarding.  ED Results / Procedures / Treatments   EKG  EKG viewed and interpreted by myself shows a normal sinus rhythm at 70 bpm with a narrow QRS, normal axis, normal intervals, no concerning ST changes.  RADIOLOGY  I have reviewed and interpreted the CT head images.  No bleed seen on my evaluation. Radiology has read the CT scan as negative.   MEDICATIONS ORDERED IN ED: Medications  sodium chloride  0.9 % bolus 500 mL (has no administration in time range)  fosfomycin (MONUROL ) packet 3 g (has no administration in time range)  diphenoxylate -atropine  (LOMOTIL ) 2.5-0.025 MG per tablet 2 tablet (2 tablets Oral Given 08/13/24 2239)     IMPRESSION / MDM / ASSESSMENT AND PLAN / ED COURSE  I reviewed the triage vital signs and the nursing notes.  Patient's presentation is most consistent with acute presentation with potential threat to life or bodily function.  Patient presents emergency department for generalized weakness and diarrhea.  Son reported possibility of some  increased weakness and right facial droop.  Patient states she only came due to diarrhea.  Patient has not had any further episodes of diarrhea since being placed into a room in the emergency department.  Has received Lomotil .  Patient's abdomen is benign and soft.  Lab work shows a reassuring CBC with chronic anemia largely unchanged.  Patient's chemistry does show renal dysfunction with a GFR of 6 but again not significantly changed from prior.  I discussed this with the patient again and she still is refusing consideration of dialysis.  Patient's urinalysis does show 11-20 red cells and 21-50 white cells with rare bacteria we will dose a one-time dose of fosfomycin as a precaution.  Patient's blood pressure is elevated 192/82.  Patient not taking any of her evening blood pressure medications.  Will dose 0.2 mg of clonidine  which the patient  normally takes in the evening.  Offered to IV hydrate however after 2 attempts they were unable to obtain an IV and the patient states she does not want to be stuck is agreeable to orally hydrate which the patient has been doing without issue.  Given the patient's otherwise reassuring workup we will discharge with outpatient follow-up.  FINAL CLINICAL IMPRESSION(S) / ED DIAGNOSES   Diarrhea  Rx / DC Orders   Lomotil   Note:  This document was prepared using Dragon voice recognition software and may include unintentional dictation errors.   Dorothyann Drivers, MD 08/13/24 [phone]

## 2024-08-27 ENCOUNTER — Telehealth: Payer: Self-pay | Admitting: Family Medicine

## 2024-08-27 DIAGNOSIS — I120 Hypertensive chronic kidney disease with stage 5 chronic kidney disease or end stage renal disease: Secondary | ICD-10-CM

## 2024-08-27 NOTE — Telephone Encounter (Unsigned)
 Copied from CRM #[id number]. Topic: Clinical - Medication Refill >> Aug 27, 2024 11:25 AM Willma R wrote: Medication: labetalol  (NORMODYNE ) 300 MG tablet  Has the patient contacted their pharmacy? Yes, call dr  This is the patient's preferred pharmacy:  [facility] [address]), [place] - [address] [place]) [place] [address] Phone: [phone] Fax: [phone]  Is this the correct pharmacy for this prescription? Yes If no, delete pharmacy and type the correct one.   Has the prescription been filled recently? No  Is the patient out of the medication? Yes  Has the patient been seen for an appointment in the last year OR does the patient have an upcoming appointment? Yes  Can we respond through MyChart? No  Agent: Please be advised that Rx refills may take up to 3 business days. We ask that you follow-up with your pharmacy.

## 2024-08-29 MED ORDER — LABETALOL HCL 300 MG PO TABS: 300.0000 mg | ORAL_TABLET | Freq: Two times a day (BID) | ORAL | 1 refills | Status: AC

## 2024-08-29 NOTE — Telephone Encounter (Signed)
 Requested Prescriptions  Pending Prescriptions Disp Refills   labetalol  (NORMODYNE ) 300 MG tablet 180 tablet 1    Sig: Take 1 tablet (300 mg total) by mouth 2 (two) times daily.     Cardiovascular:  Beta Blockers Failed - 08/29/2024  1:41 PM      Failed - Last BP in normal range    BP Readings from Last 1 Encounters:  08/14/24 (!) 200/83         Passed - Last Heart Rate in normal range    Pulse Readings from Last 1 Encounters:  08/14/24 73         Passed - Valid encounter within last 6 months    Recent Outpatient Visits           1 month ago Hospital discharge follow-up   [facility] [name], MD   8 months ago CKD (chronic kidney disease), stage V [facility])   [facility] Sowles, Krichna, MD

## 2024-11-05 ENCOUNTER — Ambulatory Visit: Admitting: Family Medicine
